# Patient Record
Sex: Male | Born: 1945 | Race: Black or African American | Hispanic: No | Marital: Married | Smoking: Former smoker
Health system: Southern US, Community
[De-identification: ages and names within clinical notes are randomized; demographics above are authoritative.]

## PROBLEM LIST (undated history)

## (undated) DIAGNOSIS — J449 Chronic obstructive pulmonary disease, unspecified: Secondary | ICD-10-CM

## (undated) DIAGNOSIS — E7800 Pure hypercholesterolemia, unspecified: Secondary | ICD-10-CM

## (undated) DIAGNOSIS — I10 Essential (primary) hypertension: Secondary | ICD-10-CM

## (undated) DIAGNOSIS — G609 Hereditary and idiopathic neuropathy, unspecified: Secondary | ICD-10-CM

## (undated) DIAGNOSIS — I2510 Atherosclerotic heart disease of native coronary artery without angina pectoris: Secondary | ICD-10-CM

## (undated) DIAGNOSIS — N189 Chronic kidney disease, unspecified: Secondary | ICD-10-CM

## (undated) DIAGNOSIS — E785 Hyperlipidemia, unspecified: Secondary | ICD-10-CM

## (undated) DIAGNOSIS — M87059 Idiopathic aseptic necrosis of unspecified femur: Secondary | ICD-10-CM

## (undated) DIAGNOSIS — M8700 Idiopathic aseptic necrosis of unspecified bone: Secondary | ICD-10-CM

## (undated) DIAGNOSIS — M1990 Unspecified osteoarthritis, unspecified site: Secondary | ICD-10-CM

## (undated) DIAGNOSIS — I2699 Other pulmonary embolism without acute cor pulmonale: Secondary | ICD-10-CM

## (undated) HISTORY — DX: Hyperlipidemia, unspecified: E78.5

## (undated) HISTORY — DX: Atherosclerotic heart disease of native coronary artery without angina pectoris: I25.10

## (undated) HISTORY — DX: Chronic obstructive pulmonary disease, unspecified: J44.9

## (undated) HISTORY — DX: Idiopathic aseptic necrosis of unspecified femur: M87.059

## (undated) HISTORY — DX: Hereditary and idiopathic neuropathy, unspecified: G60.9

## (undated) HISTORY — DX: Idiopathic aseptic necrosis of unspecified bone: M87.00

## (undated) HISTORY — PX: ABDOMINAL SURGERY: SHX537

## (undated) HISTORY — DX: Essential (primary) hypertension: I10

---

## 2001-05-07 ENCOUNTER — Ambulatory Visit (HOSPITAL_COMMUNITY): Admission: RE | Admit: 2001-05-07 | Discharge: 2001-05-07 | Payer: Self-pay | Admitting: Internal Medicine

## 2001-06-14 ENCOUNTER — Ambulatory Visit (HOSPITAL_COMMUNITY): Admission: RE | Admit: 2001-06-14 | Discharge: 2001-06-14 | Payer: Self-pay | Admitting: Internal Medicine

## 2002-03-01 ENCOUNTER — Ambulatory Visit (HOSPITAL_COMMUNITY): Admission: RE | Admit: 2002-03-01 | Discharge: 2002-03-01 | Payer: Self-pay | Admitting: Pulmonary Disease

## 2002-03-01 ENCOUNTER — Ambulatory Visit (HOSPITAL_COMMUNITY): Admission: RE | Admit: 2002-03-01 | Discharge: 2002-03-01 | Payer: Self-pay | Admitting: Cardiology

## 2002-03-02 ENCOUNTER — Ambulatory Visit (HOSPITAL_COMMUNITY): Admission: RE | Admit: 2002-03-02 | Discharge: 2002-03-02 | Payer: Self-pay | Admitting: Cardiology

## 2002-03-08 ENCOUNTER — Encounter (HOSPITAL_COMMUNITY): Admission: RE | Admit: 2002-03-08 | Discharge: 2002-04-07 | Payer: Self-pay | Admitting: Cardiology

## 2002-10-09 ENCOUNTER — Emergency Department (HOSPITAL_COMMUNITY): Admission: EM | Admit: 2002-10-09 | Discharge: 2002-10-09 | Payer: Self-pay | Admitting: Internal Medicine

## 2002-10-09 ENCOUNTER — Encounter: Payer: Self-pay | Admitting: Internal Medicine

## 2004-02-16 ENCOUNTER — Emergency Department (HOSPITAL_COMMUNITY): Admission: EM | Admit: 2004-02-16 | Discharge: 2004-02-16 | Payer: Self-pay | Admitting: Emergency Medicine

## 2004-04-17 ENCOUNTER — Ambulatory Visit: Payer: Self-pay | Admitting: Orthopedic Surgery

## 2005-03-17 ENCOUNTER — Encounter: Payer: Self-pay | Admitting: Internal Medicine

## 2005-03-17 ENCOUNTER — Ambulatory Visit (HOSPITAL_COMMUNITY): Admission: RE | Admit: 2005-03-17 | Discharge: 2005-03-17 | Payer: Self-pay | Admitting: Internal Medicine

## 2005-03-17 ENCOUNTER — Ambulatory Visit: Payer: Self-pay | Admitting: Internal Medicine

## 2005-05-13 ENCOUNTER — Ambulatory Visit (HOSPITAL_COMMUNITY): Admission: RE | Admit: 2005-05-13 | Discharge: 2005-05-13 | Payer: Self-pay | Admitting: Internal Medicine

## 2005-05-13 ENCOUNTER — Ambulatory Visit: Payer: Self-pay | Admitting: Internal Medicine

## 2005-07-04 ENCOUNTER — Ambulatory Visit: Payer: Self-pay | Admitting: *Deleted

## 2005-07-04 ENCOUNTER — Inpatient Hospital Stay (HOSPITAL_COMMUNITY): Admission: RE | Admit: 2005-07-04 | Discharge: 2005-07-29 | Payer: Self-pay | Admitting: General Surgery

## 2005-07-04 ENCOUNTER — Encounter (INDEPENDENT_AMBULATORY_CARE_PROVIDER_SITE_OTHER): Payer: Self-pay | Admitting: General Surgery

## 2005-07-11 ENCOUNTER — Ambulatory Visit: Payer: Self-pay | Admitting: Cardiology

## 2005-07-11 ENCOUNTER — Ambulatory Visit: Payer: Self-pay | Admitting: Internal Medicine

## 2005-07-16 ENCOUNTER — Ambulatory Visit: Payer: Self-pay | Admitting: Pulmonary Disease

## 2005-07-18 ENCOUNTER — Encounter: Payer: Self-pay | Admitting: Cardiology

## 2005-07-21 ENCOUNTER — Encounter: Payer: Self-pay | Admitting: Pulmonary Disease

## 2005-08-13 ENCOUNTER — Ambulatory Visit (HOSPITAL_COMMUNITY): Admission: RE | Admit: 2005-08-13 | Discharge: 2005-08-13 | Payer: Self-pay | Admitting: Internal Medicine

## 2005-12-12 ENCOUNTER — Encounter (HOSPITAL_COMMUNITY): Admission: RE | Admit: 2005-12-12 | Discharge: 2006-01-11 | Payer: Self-pay | Admitting: Pulmonary Disease

## 2006-01-12 ENCOUNTER — Encounter (HOSPITAL_COMMUNITY): Admission: RE | Admit: 2006-01-12 | Discharge: 2006-02-11 | Payer: Self-pay | Admitting: Pulmonary Disease

## 2006-02-13 ENCOUNTER — Encounter (HOSPITAL_COMMUNITY): Admission: RE | Admit: 2006-02-13 | Discharge: 2006-02-21 | Payer: Self-pay | Admitting: Pulmonary Disease

## 2006-12-04 ENCOUNTER — Ambulatory Visit: Payer: Self-pay | Admitting: Internal Medicine

## 2006-12-04 ENCOUNTER — Ambulatory Visit (HOSPITAL_COMMUNITY): Admission: RE | Admit: 2006-12-04 | Discharge: 2006-12-04 | Payer: Self-pay | Admitting: Internal Medicine

## 2007-02-22 ENCOUNTER — Encounter: Admission: RE | Admit: 2007-02-22 | Discharge: 2007-02-22 | Payer: Self-pay | Admitting: Neurology

## 2007-06-11 ENCOUNTER — Emergency Department (HOSPITAL_COMMUNITY): Admission: EM | Admit: 2007-06-11 | Discharge: 2007-06-11 | Payer: Self-pay | Admitting: Family Medicine

## 2007-07-03 ENCOUNTER — Emergency Department (HOSPITAL_COMMUNITY): Admission: EM | Admit: 2007-07-03 | Discharge: 2007-07-03 | Payer: Self-pay | Admitting: Emergency Medicine

## 2008-10-31 ENCOUNTER — Encounter: Admission: RE | Admit: 2008-10-31 | Discharge: 2008-10-31 | Payer: Self-pay | Admitting: Neurology

## 2009-01-06 ENCOUNTER — Emergency Department (HOSPITAL_COMMUNITY): Admission: EM | Admit: 2009-01-06 | Discharge: 2009-01-06 | Payer: Self-pay | Admitting: Emergency Medicine

## 2010-02-01 ENCOUNTER — Encounter (INDEPENDENT_AMBULATORY_CARE_PROVIDER_SITE_OTHER): Payer: Self-pay

## 2010-02-20 ENCOUNTER — Encounter: Payer: Self-pay | Admitting: Internal Medicine

## 2010-03-07 ENCOUNTER — Telehealth (INDEPENDENT_AMBULATORY_CARE_PROVIDER_SITE_OTHER): Payer: Self-pay

## 2010-03-08 ENCOUNTER — Encounter: Payer: Self-pay | Admitting: Internal Medicine

## 2010-03-22 ENCOUNTER — Ambulatory Visit: Payer: Self-pay | Admitting: Internal Medicine

## 2010-03-22 ENCOUNTER — Ambulatory Visit (HOSPITAL_COMMUNITY): Admission: RE | Admit: 2010-03-22 | Discharge: 2010-03-22 | Payer: Self-pay | Admitting: Internal Medicine

## 2010-03-22 HISTORY — PX: COLONOSCOPY: SHX174

## 2010-03-28 ENCOUNTER — Encounter: Payer: Self-pay | Admitting: Internal Medicine

## 2010-06-16 ENCOUNTER — Encounter: Payer: Self-pay | Admitting: Internal Medicine

## 2010-06-17 ENCOUNTER — Encounter: Payer: Self-pay | Admitting: Neurology

## 2010-06-27 NOTE — Progress Notes (Signed)
Summary: reviewed meds for tcs  Phone Note Call from Patient   Caller: Patient Summary of Call: Pt returned my call. I reviewed his meds, one change since triaged and that was Amitriptyline d/c'd. No new medical problems. Pt is scheduled for his colonoscopy on 03/22/2010. Initial call taken by: Cloria Spring LPN,  March 07, 2010 2:31 PM

## 2010-06-27 NOTE — Letter (Signed)
Summary: Recall, Screening Colonoscopy Only  [facility]  [address]   [place], [place] [address]   Phone: [phone]  Fax: [phone]    February 01, 2010  Kyle Patel [address] [place], [place]  [address] 1946/05/12   Dear [name],   Our records indicate it is time to schedule your colonoscopy.     Please call our office at [phone] and ask for the nurse.   Thank you,  Hendricks Limes, LPN Cloria Spring, LPN  [facility] Ph: [phone]   Fax: [phone]

## 2010-06-27 NOTE — Letter (Signed)
Summary: TRIAGE ORDER  TRIAGE ORDER   Imported By: Ave Filter 02/20/2010 10:00:24  _____________________________________________________________________  External Attachment:    Type:   Image     Comment:   External Document

## 2010-06-27 NOTE — Letter (Signed)
Summary: TRIAGE ORDER  TRIAGE ORDER   Imported By: Ave Filter 03/08/2010 10:54:34  _____________________________________________________________________  External Attachment:    Type:   Image     Comment:   External Document

## 2010-06-27 NOTE — Letter (Signed)
Summary: Patient Notice, Colon Biopsy Results  [facility]  [address]   [place], [place] [address]   Phone: [phone]  Fax: [phone]       March 28, 2010   [place] [address] [place], [place]  [address] [birth year]    Dear Kyle Patel,  I am pleased to inform you that the biopsies taken during your recent colonoscopy did not show any evidence of cancer upon pathologic examination.  Additional information/recommendations:  No further action is needed at this time.  Please follow-up with your primary care physician for your other healthcare needs.  You should have a repeat colonoscopy examination  in 5 years.  Please call us if you are having persistent problems or have questions about your condition that have not been fully answered at this time.  Sincerely,    R. Roetta Sessions MD, FACP [facility] Ph: [phone]    Fax: [phone]   Appended Document: Patient Notice, Colon Biopsy Results letter mailed to pt  Appended Document: Patient Notice, Colon Biopsy Results reminder in computer

## 2010-08-31 LAB — COMPREHENSIVE METABOLIC PANEL
ALT: 19 U/L (ref 0–53)
Albumin: 3.6 g/dL (ref 3.5–5.2)
Alkaline Phosphatase: 68 U/L (ref 39–117)
BUN: 20 mg/dL (ref 6–23)
Chloride: 109 mEq/L (ref 96–112)
GFR calc Af Amer: 58 mL/min — ABNORMAL LOW (ref >60)
Potassium: 3.9 mEq/L (ref 3.5–5.1)
Sodium: 140 mEq/L (ref 135–145)
Total Bilirubin: 0.4 mg/dL (ref 0.3–1.2)
Total Protein: 7 g/dL (ref 6.0–8.3)

## 2010-08-31 LAB — DIFFERENTIAL
Basophils Absolute: 0 10*3/uL (ref 0.0–0.1)
Basophils Relative: 0 % (ref 0–1)
Eosinophils Absolute: 0.2 10*3/uL (ref 0.0–0.7)
Eosinophils Relative: 2 % (ref 0–5)
Monocytes Absolute: 1 10*3/uL (ref 0.1–1.0)
Monocytes Relative: 12 % (ref 3–12)
Neutro Abs: 5.6 10*3/uL (ref 1.7–7.7)

## 2010-08-31 LAB — POCT CARDIAC MARKERS
CKMB, poc: 5.7 ng/mL (ref 1.0–8.0)
CKMB, poc: 5.8 ng/mL (ref 1.0–8.0)
Myoglobin, poc: >500 ng/mL (ref 12–200)
Troponin i, poc: <0.05 ng/mL (ref 0.00–0.09)

## 2010-08-31 LAB — CBC
MCHC: 34.7 g/dL (ref 30.0–36.0)
MCV: 91.2 fL (ref 78.0–100.0)
RBC: 4.13 MIL/uL — ABNORMAL LOW (ref 4.22–5.81)
RDW: 15.3 % (ref 11.5–15.5)

## 2010-10-08 NOTE — Op Note (Signed)
NAME:  Kyle Patel, Kyle Patel NO.:  [id number]   MEDICAL RECORD NO.:  [id number]          PATIENT TYPE:  AMB   LOCATION:  DAY                           FACILITY:  [facility]   PHYSICIAN:  R. Roetta Sessions, M.D. DATE OF BIRTH:  [birth year]   DATE OF PROCEDURE:  12/04/2006  DATE OF DISCHARGE:                                PROCEDURE NOTE   INDICATIONS FOR PROCEDURE:  The patient is a 65 year old gentleman who  was found to have a lesion at the splenic flexure in December 2006, on  colonoscopy.  Piecemeal removal revealed villous adenoma with carcinoma  in situ and it was felt this lesion could be removed completely  endoscopically.  Dr. Elpidio Anis performed a left hemicolectomy.  In  February 2007, path came back villous adenoma.  He has done well.  He  has not had any lower GI tract symptoms.  He is here for surveillance  examination.  This approach has been discussed with the patient at  length.  Potential risks, benefits and alternatives have been reviewed  and questions answered.  Please see documentation in medical record.   PROCEDURE NOTE:  O2 saturation, blood pressure, pulse and respirations  were monitored throughout the entire procedure.  Conscious sedation with  Versed 3 mg IV, Demerol 75 mg IV in divided doses.  Pentax video chip  system.   FINDINGS:  Digital exam revealed no abnormalities.  The prep was good.  Colonic mucosa was examined from the rectosigmoid junction to the  residual colonic mucosa all the way to the cecum.  Cecum, ileocecal  valve, appendiceal orifice well-seen.   Prior to its removal, the scope was slowly withdrawn where previous  mucosal surfaces were again seen.  The residual colonic mucosa appeared  normal.  Examination of the rectal mucosa with colorectal retroflexion  at the anal verge demonstrated no abnormalities.  The patient tolerated  the procedure well.   IMPRESSION:  Normal rectum, status post left hemicolectomy, otherwise  normal residual colonic mucosa.   RECOMMENDATIONS:  Repeat colonoscopy in 3 years.      [name], M.D.  Electronically Signed     RMR/MEDQ  D:  12/04/2006  T:  12/04/2006  Job:  [id number]   cc:   Ramon Dredge L. Juanetta Gosling, M.D.  Fax: [phone]

## 2010-10-11 NOTE — Procedures (Signed)
NAME:  Kyle Patel, Kyle Patel NO.:  [id number]   MEDICAL RECORD NO.:  [id number]          PATIENT TYPE:  INP   LOCATION:  IC10                          FACILITY:  [facility]   PHYSICIAN:  Vida Roller, M.D.   DATE OF BIRTH:  November 17, 1945   DATE OF PROCEDURE:  07/08/2005  DATE OF DISCHARGE:                                  ECHOCARDIOGRAM   PRIMARY:  Dr. Juanetta Gosling.   TAPE NUMBER:  [id number].  TAPE COUNT:  0 through 415.   This is a man with respiratory failure for LV systolic function. Technical  quality of the study is difficulty.   M-MODE TRACING:  The aorta is 31 mm.   The left atrium is 36 mm.   The septum is 14 mm.   Posterior wall is 12 mm.   Left ventricular diastolic dimension is 35 mm.   Left ventricular systolic dimension is 21 mm.   TWO-DIMENSIONAL AND DOPPLER IMAGING:  The left ventricle is small. There is  vigorous LV systolic function estimated at greater than 75%. There are no  obvious wall motion abnormalities. The septum is slightly flat, and there is  mild concentric left ventricular hypertrophy.   Right ventricle looks dilated the free wall is mildly hypokinetic. The  septum definitely is flattened. There is depressed RV systolic function.   The left atrium appears normal size. The right atrium is dilated.   The aortic valve is not well seen but did not see any significant valvular  heart disease.   The mitral valve also not well seen but appears to not have any significant  valvular heart disease either.   The tricuspid valve has some degree of regurgitation which is not well seen.   There is no pericardial effusion.      Vida Roller, M.D.  Electronically Signed     JH/MEDQ  D:  07/08/2005  T:  07/09/2005  Job:  [id number]

## 2010-10-11 NOTE — Discharge Summary (Signed)
NAME:  Kyle Patel, Kyle Patel NO.:  [id number]   MEDICAL RECORD NO.:  [id number]          PATIENT TYPE:  INP   LOCATION:  2313                         FACILITY:  [facility]   PHYSICIAN:  Dirk Dress. Katrinka Blazing, M.D.   DATE OF BIRTH:  06/23/1945   DATE OF ADMISSION:  07/04/2005  DATE OF DISCHARGE:  03/06/2007LH                                 DISCHARGE SUMMARY   DISCHARGE DIAGNOSIS:  1.  Recurrent villous tumor left colon.  2.  Hypotension.  3.  Osteoarthritis.  4.  Postoperative respiratory failure, ventilator dependent due to adult      respiratory distress syndrome.  5.  Hypokalemia.  6.  Renal failure.   DISPOSITION:  The patient was transferred to the critical care unit at [facility] for treatment of his acute respiratory failure.   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  He had initial resection  endoscopically in December 2002 and follow-up resection in January 2003,  repeat resection in October 2003.  He had a repeat evaluation in October  2006 and was found to have a large sessile polyp of the splenic flexure that  was partially removed with snare polypectomy.  He was referred for  Villotubular adenoma.  He was seen in December 2006.  He was advised to have  segmental colectomy but he wished to wait until February.  He states that he  had not had any problems in the interim.  The patient was scheduled for  repeat colonoscopy with confirmation of the site of the tumor with follow-up  segmental colectomy.  The patient underwent a bowel prep on February 9 is  admitted through day surgery.  He had intraoperative colonoscopy with  isolation of the tumor followed by a segmental colectomy.  This surgery went  uneventfully.  He had a primary stapled anastomosis without difficulty.  First postoperative day was unremarkable except that the patient had  leukocytosis with white count  15,000.  On the second postoperative day he  appeared to have some respiratory distress at rest. He denies shortness of  breath. He had O2 saturation of 99% on nonrebreathing mask, pO2 was 131, pH  7.32. Lungs revealed diffuse rales and rhonchi at both lung fields and was  mildly tachycardiac.  Chest x-ray showed infiltrates of the lungs  bilaterally and worse on the right than on the left. It was felt that he had  some volume overload.  He was given IV Lasix, IV fluids were decreased to 50  mL per hour.  He had continued respiratory difficulty with increasing  tachypnea. He had no evidence of peripheral edema and on the 12th, his O2  sat was 95% on 100% oxygen by Veni mask.  The exam of his chest showed  diffuse rales in both lung fields, worse on the right.  There was no JVD of  his neck.  By the 13th, the patient had worsening symptoms.  He developed  acute respiratory failure.  He was found by the nurses nonverbal  with  grayish discoloration and diaphoretic with poor respiratory effort.  O2 sat  was in the 60s on 100% nonrebreathing. Blood gases showed a pO2 of 19. This  was done on room air.  Reason for doing blood gases on room air is unknown.  He was seen initially by Annia Friendly. Loleta Chance, M.D. who managed his care. He was  intubated, transferred to the ICU with onset of ventilatory management.  He  was started on day of nebulizer treatments.  He was seen in consultation by  Ramon Dredge L. Juanetta Gosling, M.D. 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  He had some improvement  after intubation. His O2 sat was 100% on 100%, but his pO2 was only 68.  White count did not increase, hemoglobin remained stable. Lungs revealed  diffuse rales bilaterally.  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  He was treated and was seen in consultation by the pulmonary service at  [facility] via [facility].  Their recommendations were followed.  By  the 15th he was stable except for his respiratory status. His lungs  showed  diffuse rhonchi bilaterally, white count was 11,000, hemoglobin was stable.  BUN was rising from diuretic therapy and restriction of fluids.  Abdominal  films showed minimal small bowel gas with gas extending into the left colon.  He was discussed for transfer by the pulmonary service at [facility] and they agreed to accept him.  He was transferred by [facility] to  critical care unit at [facility] 11:40. His status was stable but  his condition was guarded at the time of transfer.      Dirk Dress. Katrinka Blazing, M.D.  Electronically Signed     LCS/MEDQ  D:  08/24/2005  T:  08/25/2005  Job:  [id number]

## 2010-10-11 NOTE — H&P (Signed)
NAME:  Kyle Patel, Kyle Patel NO.:  [id number]   MEDICAL RECORD NO.:  [id number]          PATIENT TYPE:  AMB   LOCATION:  DAY                           FACILITY:  [facility]   PHYSICIAN:  Jerolyn Shin C. Katrinka Blazing, M.D.   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  He had initial  resection in December 2002 with followup resection in January 2003.  He had  repeat resection again in October 2003 and that showed a 4 x 5 cm mass with  villous tumor.  He had repeat evaluation in October 2006 and was found to  have a large sessile polyp at the splenic flexure which was partially  removed with snare polypectomy.  Pathology showed villotubular adenoma.  He  was finally referred and was seen in December 2006.  He was advised to have  segmental colectomy but he wished to wait until February.  He has not had  any problems in the interim.  He is now scheduled for repeat colonoscopy  with confirmation of the site of the tumor with followup segmental  colectomy.   PAST MEDICAL HISTORY:  1.  Hypertension.  2.  Osteoarthritis.   MEDICATIONS:  1.  Lodine two tablets twice a day.  2.  Lipitor 40 mg daily.  3.  Lotrel 5/10 daily.  4.  Tramadol 50 mg every six hours.  5.  Prednisone 10 mg two twice a daily.  6.  Aspirin 81 mg daily.   PHYSICAL EXAMINATION:  VITAL SIGNS:  Blood pressure 148/86, pulse 104,  respirations 20, weight 186 pounds.  HEENT:  Unremarkable.  NECK:  Supple.  No JVD, bruits, adenopathy, or thyromegaly.  CHEST:  Reveals a few rhonchi with scattered fine wheezes.  HEART:  Regular rate and rhythm without murmur, gallop or rub.  ABDOMEN:  Soft, nontender.  No masses.  EXTREMITIES:  Mild tenderness of the metacarpals of the right hand.  No  swelling noted.  Good  mobility of the fingers of the right hand.  NEUROLOGIC:  No focal motor, sensory or cerebellar deficits.   IMPRESSION:  1.  Tubulovillous tumor, left colon.  2.  Hypertension.  3.  Osteoarthritis.   PLAN:  Preoperative colonoscopy with localization of tumor followed by  segmental colectomy.      Dirk Dress. Katrinka Blazing, M.D.  Electronically Signed     LCS/MEDQ  D:  07/03/2005  T:  07/04/2005  Job:  [id number]

## 2010-10-11 NOTE — Consult Note (Signed)
NAME:  Kyle Patel, Kyle Patel NO.:  [id number]   MEDICAL RECORD NO.:  [id number]          PATIENT TYPE:  INP   LOCATION:  IC10                          FACILITY:  [facility]   PHYSICIAN:  Edward L. Juanetta Gosling, M.D.DATE OF BIRTH:  1945-09-16   DATE OF CONSULTATION:  07/08/2005  DATE OF DISCHARGE:                                   CONSULTATION   MEDICAL CONSULTATION:  Patient of Kyle Patel.   REASON FOR CONSULTATION:  Respiratory failure.   HISTORY:  Kyle Patel is a 65 year old who had surgery done on the 9th which  was for a recurrent tubulovillous adenoma of the left colon; he had a  segmental left colectomy. Since that time he has had what looks like perhaps  some congestive heart failure on chest x-ray and then today he became  increasingly hypoxic to the point that he required intubation and mechanical  ventilation. He had an initial resection December 2002, followup January  2003, October 2003 and then in 2006 he was found to have a large sessile  polyp at the splenic flexure which showed villotubular adenoma as well. He  had been seen in December by Kyle Patel, was advised to have segmental  colectomy, wanted to wait until February.   PAST MEDICAL HISTORY:  Positive for hypertension, osteoarthritis,  hyperlipidemia.   SOCIAL HISTORY:  He smokes about a pack of cigarettes a day. He does use  alcohol on a daily basis.   FAMILY HISTORY:  His family history is positive for heart disease but it is  not really quite clear exactly the degree of that.   REVIEW OF SYSTEMS:  His review of systems otherwise pretty negative.   PHYSICAL EXAMINATION:  GENERAL:  Physical examination now shows that he is  intubated on a ventilator. He is sedated.  VITAL SIGNS:  The heart rate is 137, blood pressure 126/64, O2 saturation is  100%, respirations 14.  HEAD AND NECK:  He does not have any definite jugular venous distension. His  mucous membranes are moist. His pupils do react.  CHEST:  His  chest shows wheezes and rales bilaterally. His heart is regular  without gallop but with a tachycardia.  ABDOMEN:  Abdomen soft, no masses postoperative.  EXTREMITIES:  Extremities showed no edema.  CNS:  He is sedated.   LABORATORY WORK:  Cardiac panel done yesterday showed a troponin of 0.08, CK  440, MB of 4.6 without definite evidence of myocardial infarction. Blood gas  at 4 o'clock on room air pH 7.44, pCO2 of 41, pO2 listed at 19 (I suspect  this was a venous gas). Cardiac panel from 550 CK 327, MB 4.3, troponin  0.07. CBC: White count 11,300, hemoglobin 11.1, platelets 212. BMET: BUN is  19, creatinine 1.3, glucose 130, sodium 147. Chest x-ray shows marked  bilateral infiltration.   ASSESSMENT AND PLAN:  I think clearly he has got respiratory failure  probably on the basis of congestive heart failure. He is set for  echocardiogram. He is having serial cardiac enzymes, would go ahead and  check an EKG, check BNP to  see what that shows, continue to follow his blood  gas.      Edward L. Juanetta Gosling, M.D.  Electronically Signed     ELH/MEDQ  D:  07/08/2005  T:  07/08/2005  Job:  [id number]

## 2010-10-11 NOTE — Procedures (Signed)
   NAME:  Kyle Patel, Kyle Patel                           ACCOUNT NO.:  [id number]   MEDICAL RECORD NO.:  [id number]                   PATIENT TYPE:  OUT   LOCATION:  DFTL                                 FACILITY:  [facility]   PHYSICIAN:  Edward L. Juanetta Gosling, M.D.             DATE OF BIRTH:  1945-10-13   DATE OF PROCEDURE:  DATE OF DISCHARGE:                                    STRESS TEST   INDICATIONS FOR PROCEDURE:  The patient is undergoing graded exercise  testing basically as part of a physical examination.  He has no cardiac  symptoms.  There were no contraindications to graded exercise testing.   The patient exercised for 5 minutes and 10 seconds on the Bruce protocol  reaching and sustaining 7.0 METS.  His maximum recorded heart rate is 165  which is 99% of his age predicted maximal heart rate.  His blood pressure  response to exercise was exaggerated with peak systolic blood pressure being  240, peak diastolic being 98.  He developed ST-T wave changes inferiorly and  laterally that were as much as 2 mm of flatline ST depression, but without  any associated symptoms.  His resting electrocardiogram showed nonspecific  ST-T wave changes.  Exercise was discontinued and his EKG returned toward  normal.  He had no symptoms during exercise except a sensation that his  heart was beating fast.   IMPRESSION:  1. Fair exercise tolerance.  2. Hypertensive response to exercise.  3. Marked ST-T wave changes suggestive of inducible ischemia with exercise.                                               Edward L. Juanetta Gosling, M.D.    ELH/MEDQ  D:  03/01/2002  T:  03/02/2002  Job:  [id number]

## 2010-10-15 NOTE — Op Note (Signed)
NAME:  Kyle Patel, Kyle Patel NO.:  [id number]   MEDICAL RECORD NO.:  [id number]          PATIENT TYPE:  AMB   LOCATION:  DAY                           FACILITY:  [facility]   PHYSICIAN:  R. Roetta Sessions, M.D. DATE OF BIRTH:  07-16-1945   DATE OF PROCEDURE:  05/13/2005  DATE OF DISCHARGE:                                 OPERATIVE REPORT   PROCEDURE:  Colonoscopy with biopsy.   INDICATIONS FOR PROCEDURE:  The patient is a 65 year old gentleman with a  history of colonic polyps who came back for surveillance colonoscopy in  October this year (he was overdue). He was found to have a large sessile  polyp at the splenic flexure which was partially piecemeal, removed with  saline cystic duct polypectomy technique. There was a polyp at ascending  colon which was cold biopsied/removed.   The tissue from the splenic flexure lesion came back tubulovillous adenoma.  No cancer (this was erroneously labeled as hepatic flexure by pathologist  previously). Because of the complex nature of this polyp, he was brought  back early to reassess this location as there was likely residual polyp  present. Kyle Patel has not had any GI symptoms since the last colonoscopy.  Colonoscopy is now being done for the reasons outlined above. Potential  risks, benefits, and alternatives have been reviewed and questions answered.  He is agreeable. Please see documentation in the medical record.   PROCEDURE NOTE:  O2 saturation, blood pressure, pulse, and respirations were  monitored throughout the entire procedure. Conscious sedation with Versed 3  mg IV and Demerol 75 mg IV in divided doses.   INSTRUMENT:  Olympus video chip system.   FINDINGS:  Digital rectal exam revealed no abnormalities.   ENDOSCOPIC FINDINGS:  Prep was good.   Rectum:  Examination of the rectal mucosa including retroflexed view of the  anal verge revealed no abnormalities.   Colon:  Colonic mucosa was surveyed from the  rectosigmoid junction through  the left, transverse, and right colon to the area of the appendiceal  orifice, ileocecal valve, and cecum. These structures were well seen and  photographed for the record. From this level, the scope was slowly  withdrawn, and all previously mentioned mucosal surfaces were again seen.  Again, at the area of the splenic flexure, there was a sprawling sessile  appearing polypoid lesion with two areas of central depression straddling a  fold. Also, the periphery of the lesion distally appeared to be associated  with retracted colonic mucosa into the lesion.   This had an infiltrating appearance. Utilizing the sclerotherapy needle, I  injected 3 cc of sterile normal saline at the periphery of the lesion. This  lesion appeared to be fixed and did not lift away from the colonic wall.  Consequently, I took the jumbo biopsy forceps and took several biopsies for  histology. No attempts at further resection were made. The remainder of the  colonic mucosa appeared normal. The patient tolerated the procedure well and  was reactive to endoscopy.   IMPRESSION:  1.  Normal rectum.  2.  A  3 x 4 cm sessile lesion with central depression and with associated      mucosal retraction at the splenic flexure representing site of prior      polypectomy. This lesion was biopsied only. It would not lift up with      sterile saline injection. The remainder of the colonic mucosa appeared      normal.   RECOMMENDATIONS:  1.  The patient needs to have the lesion at the splenic flexure removed      surgically as it is not amendable to complete endoscopic resection. Will      discuss further with patient and family. He should have his splenic      flexure removed in the near future. Will follow up on pathology.  2.  Further recommendations to follow.      Kyle Patel, M.D.  Electronically Signed     RMR/MEDQ  D:  05/13/2005  T:  05/14/2005  Job:  [id number]

## 2010-10-15 NOTE — Op Note (Signed)
NAME:  Kyle Patel, Kyle Patel NO.:  [id number]   MEDICAL RECORD NO.:  [id number]          PATIENT TYPE:  INP   LOCATION:  2313                         FACILITY:  [facility]   PHYSICIAN:  Adolph Pollack, M.D.DATE OF BIRTH:  [birth year]   DATE OF PROCEDURE:  07/28/2005  DATE OF DISCHARGE:                                 OPERATIVE REPORT   PREOP DIAGNOSIS:  Ventilatory dependent respiratory failure.   POSTOPERATIVE DIAGNOSIS:  Ventilatory dependent respiratory failure.   PROCEDURE:  Tracheostomy (#8 cuffed Shiley).   SURGEON:  Rosenbower.   ASSISTANT:  Revonda Standard L. Rennis Harding, N.P.   ANESTHESIA:  General.   INDICATIONS:  This is a 65 year old male who has got ventilatory dependent  respiratory failure unable to be weaned. It is felt that tracheostomy will  help with his wean and long-term placement. He is now brought to the  operating room for that.   He is brought from the ICU to the operating room and placed on the operating  room table and given some general anesthesia.  The neck was slightly  extended and the neck was sterilely prepped and draped. Local anesthetic was  infiltrated the subcutaneous tissue. A transverse incision was made one  fingerbreadth above the clavicles in the midline through skin, subcutaneous  tissue. Anterior jugular vein was divided. The pretracheal fascia was  incised and the trachea was identified. Two stay sutures of 3-0 silk were  placed on the right and left side of the trachea at the level of the first  and second tracheal ring. Then made an incision between the first and the  second tracheal ring in a cruciate fashion. The endotracheal tube was pulled  back and the tracheostomy tube placed into the trachea. The balloon was  inflated. The tracheostomy tube was hooked up to the ventilator with good  CO2 return and good oxygenation.   Tracheostomy tube was then anchored to the skin with 3-0 nylon suture.  Tracheostomy tape was also tied to  further anchor the tracheostomy. A  sterile dressing was applied.   He tolerated procedure without apparent complications, was taken back to the  ICU in critical condition. A portal chest x-ray is pending.      Adolph Pollack, M.D.  Electronically Signed    TJR/MEDQ  D:  07/28/2005  T:  07/29/2005  Job:  [id number]

## 2010-10-15 NOTE — Op Note (Signed)
NAME:  [name], [name] NO.:  [id number]   MEDICAL RECORD NO.:  [id number]          PATIENT TYPE:  AMB   LOCATION:  DAY                           FACILITY:  [facility]   PHYSICIAN:  R. Roetta Sessions, M.D. DATE OF BIRTH:  February 14, 1946   DATE OF PROCEDURE:  03/17/2005  DATE OF DISCHARGE:                                 OPERATIVE REPORT   PROCEDURE:  Colonoscopy with saline assisted snare polypectomy and cold  biopsy.   ENDOSCOPIST:  Gerrit Friends. Rourk, M.D.   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  He underwent snare polypectomy.  He underwent followup exam with  snare polypectomy in the same area in 2003.  He is overdue for early  surveillance followup as previously recommended.  He is having no lower GI  tract symptoms.  Colonoscopy is now being done.  This approach has been  discussed with the patient at length.  The potential risks, benefits, and  alternatives have been reviewed; questions answered.  He is agreeable.  Please see documentation in the medical record.   PROCEDURE NOTE:  O2 saturation, blood pressure, pulse and respirations were  monitored throughout the entire procedure.   CONSCIOUS SEDATION:  Versed 4 mg IV, Demerol 100 mg IV in divided doses.   INSTRUMENT:  Olympus video chip system.   FINDINGS:  Digital rectal exam revealed no abnormalities.   ENDOSCOPIC FINDINGS:  The prep was good.   RECTUM:  Examination of the rectal mucosa including the retroflex view of  the anal verge revealed no abnormalities.   COLON:  The colonic mucosa was surveyed from the rectosigmoid junction  through the left transverse and right colon to the area of the appendiceal  orifice, ileocecal valve, and cecum.  These structures were well seen and  photographed for the record.   From this level the scope was slowly withdrawn.  All previously mentioned  mucosal surfaces were again seen.  The patient had a sprawling, 4 x 5 cm  polyp spanning the fold at the splenic flexure; and a 3 mm diminutive polyp  in the mid ascending colon.  The mid ascending polyp was cold  biopsied/removed.  The polyp at the splenic flexure was lifted away from the  colonic wall with 4 mL of sterile normal saline injected submucosally.  A  subsequent piecemeal polypectomy was performed.  Multiple fragments were  submitted to the pathologist.  We left a small amount of residual polypoid  tissue behind a fold, but the majority of this lesion appeared to have been  removed without apparent complication.  The remainder of the colonic mucosa  appeared normal.  The patient tolerated the procedure very well; and was  reacted in endoscopy.   IMPRESSION:  1.  Normal rectum.  2.  Large sessile polyp at the splenic flexure, at least partially removed      with piecemeal snare polypectomy, saline assisted.  3.  The remaining polyp in the ascending colon cold biopsied/removed.  4.  The remainder of the colonic mucosa appeared normal.   RECOMMENDATIONS:  1.  No aspirin or arthritis medications for 10 days.  2.  Followup on path.  3.  If the lesion contains carcinoma he will need a left hemicolectomy; if      not, he will need a VERY EARLY surveillance exam, i.e. in 6-8 weeks.      This algorithm has been gone over in detail with Kyle Patel.      Kyle Patel, M.D.  Electronically Signed     RMR/MEDQ  D:  03/17/2005  T:  03/17/2005  Job:  [id number]   cc:   Kyle Patel, M.D.  Fax: [phone]

## 2010-10-15 NOTE — Discharge Summary (Signed)
NAME:  Kyle Patel, Kyle Patel NO.:  [id number]   MEDICAL RECORD NO.:  [id number]          PATIENT TYPE:  INP   LOCATION:  2313                         FACILITY:  [facility]   PHYSICIAN:  Shan Levans, M.D. LHCDATE OF BIRTH:  04-Apr-1946   DATE OF ADMISSION:  07/11/2005  DATE OF DISCHARGE:                                 DISCHARGE SUMMARY   DISCHARGE DIAGNOSES:  1.  Prolonged vent-dependent respiratory failure with acute respiratory      distress syndrome.  2.  Ileus.  3.  Hypertension.  4.  Anemia.  5.  Hypocalcemia.  6.  Resolved hyponatremia.  7.  Leukocytosis.  8.  Hyperglycemia.   HISTORY OF PRESENT ILLNESS:  Kyle Patel is a 65 year old African-American male  one pack per day smoker lifelong who was diagnosed with a tubulovillous  adenoma and underwent segmental left colectomy on July 04, 2005 by Dr.  Elpidio Anis.  Postoperatively on July 08, 2005 he was found to be  nonverbal with respiratory distress.  This was noted by a nurse on the floor  at [facility]  He required urgent orotracheal intubation and  mechanical ventilatory support and chest x-ray and CT were indicative of  acute lung injury.  He was transferred to [facility] on July 11, 2005 under the care of the pulmonary critical care group for ICU  management.   In discussion with his family he has had hypertension for greater than two  years, noncompliant with his medications.  He was required to restart  antihypertensives prior to surgery for a blood pressure reportedly being  200/100.  Family reports he was having nightly vomiting episodes.  He is not  normally limited by shortness of breath with activities of daily living  prior to surgery.   PAST MEDICAL HISTORY:  1.  Hypertension with noncompliance.  He had been on Avapro and Lotrel prior      to surgery.  2.  Hyperlipidemia with Lipitor treatment.  3.  Chronic obstructive pulmonary disease which is followed by Shaune Pollack,      M.D.   He had no known drug allergies.  MORPHINE SULFATE did create itching.   SOCIAL HISTORY:  Remarkable for one pack a day lifelong tobacco.  He worked  for the tobacco company.  He is married with children.   FAMILY HISTORY:  Reviewed and noncontributory.   REVIEW OF SYSTEMS:  Not intubated because he arrived on mechanical  ventilatory support with a #8 endotracheal tube in place.   LABORATORY DATA:  pH 7.43, pCO2 38, pO2 60.  Sodium 151, potassium 3.1,  chloride 119, CO2 27, glucose 98, BUN 45, creatinine 1, calcium 8.1,  magnesium 2.3, phosphorous 4.5.  WBC 23.2, hemoglobin 9.1, hematocrit 27.2,  platelets 277.  ESR 91.  Chest x-ray shows no interval change.  Note, Swan-  Ganz catheter had been removed.  He has a left arm percutaneous indwelling  Seldinger  catheter that has been placed.  12-lead EKG shows normal sinus  rhythm, ventricular rate of 83 beats per minute.  Transesophageal  echocardiogram performed  on February 23 demonstrates left ventricular  ejection fraction estimated to be 75%.  Study was inadequate for evaluation  of left ventricular regional wall motion.  Patient is tachycardic at time of  study.   HOSPITAL COURSE:  #1 - VENT-DEPENDENT RESPIRATORY FAILURE FOLLOWING  LAPAROTOMY FOR A COLECTOMY PERFORMED ON July 04, 2005 BY DR. Katrinka Blazing IN  [place]:  He remains on mechanical ventilatory support.  He has been indicative of acute respiratory distress syndrome.  His current  mechanical ventilatory settings are at AC/VC plus with a rate of 35 with a  tidal volume of 420 mL which equals 6 mL/kg for low stretch ventilation.  His FiO2 of 0.4 and he is on 5 of PEEP.  This generates a peak inspiratory  pressure of 30 with a plateau pressure of 20.  His last blood gases on these  settings were pH 7.39, pCO2 41, pO2 of 43 with a bicarbonate of 25 and then  base excess of 0 with saturations of 94%.  He has required prolonged  mechanical  ventilatory support.  He will undergo tracheostomy by Dr. Avel Peace today in preparation for transfer to a long-term ventilatory care  facility.   #2 - ILEUS:  He is currently noted to have an ileus.  He is on TNA.  His TNA  is going at 45 mL an hour.  He does have a Panda tube in place which is  currently being flushed and has minimal drainage at this time.  He does have  a Flexoseal in place with very little drainage.   #3 - HYPERTENSION:  Hypertension is moderately well controlled on current  medications.  He is noted to be tachycardic at times.   #4 - ANEMIA:  His hemoglobin is increased from 10.2 to 9.1.  He remains on  EPO protocol.   #5 - HYPOKALEMIA:  Mild.   #6 - HYPONATREMIA:  Being addressed with TNA.   #7 - LEUKOCYTOSIS:  He is currently off all antibiotics.   #8 - HYPERGLYCEMIA:  He remains on a D5W for increased free water.  This  will be discontinued and will give him free O2 via his feeding tube.  His IV  has been changed to quarter normal saline at 40 mL an hour.   PROCEDURE:  He had endotracheal tube placed on February 13 which was removed  on February 20.  He required urgent re-intubation on February 20.  It  remains in place.  He had a PA catheter and sleeve placed on February 22  which was removed on February 28.  He had a left subclavian central venous  catheter placed on February 16, removed on February 23.  He had a left PICC  placed on February 23, removed on March 4.  He had a right PICC placed on  March 4  which currently remains in place.  Note that he was treated with  steroids on February 23.  He has GI prophylaxis with proton-pump inhibitor  and DVT prophylaxis with PAS hose.  He has been on ARDS meds since February  16.  He has had a sedation protocol since February 16.  He has been on EPO  protocol since July 14, 2005.   MEDICATIONS:  1.  He is on ICU hyperglycemic protocol.  2.  Atrovent 0.5 mg via nebulizer q.6h. 3.  Chlorhexidine  oral hygiene.  4.  Aricept 100 mcg every Monday at 6 p.m.  5.  Thiamine 100 mg IV daily.  6.  Xopenex 0.63 q.6h. by nebulizer.  7.  Zantac 150 mg q.12h.  8.  Catapres 0.2 mg via tube q.6h.  9.  Lantus via subcutaneous injection 25 units q.12h.  10. Sliding scale insulin per the standard ICU hyperglycemic protocol.  11. Klonopin 4 mg q.6h.  12. Duragesic patch 100 mcg changed q.72h.  13. Solu-Cortef 100 mg IV q.8h.  14. Seroquel 200 mg q.12h.  15. Lovenox which has been placed on hold for impending trach.  16. IV as dictated earlier.  17. Senokot p.r.n. one tablet q.h.s.  18. Ativan 1 mg IV q.3h. p.r.n.  19. Haldol 1-2 mg every 30 minutes as needed.  20. Nitroglycerin drip 50 mg p.r.n.  21. Versed p.r.n. 2-5 mg per hour.  22. Ativan 1-4 mg per hour, both of these through the sedation protocol.  23. Fentanyl 25-100 IV per hour via the sedation protocol.   DIET:  TNA.   DISPOSITION/CONDITION ON DISCHARGE:  He has remained in vent-dependent  respiratory failure since admission.  His peak inspiratory pressures have  improved.  His FiO2 demands have increased.  He is status post colectomy and  this has been evaluated by the [facility] after his transfer  down here from Dr. Eppie Gibson service in [place], [place].  He  needs long-term vent management.  The plan is to transfer him to [facility]  facility in the near future.      Brett Canales Minor, A.C.N.P. [facility]      Shan Levans, M.D. [facility]  Electronically Signed    SM/MEDQ  D:  07/28/2005  T:  07/28/2005  Job:  [id number]   cc:   Adolph Pollack, M.D.  [address]  [place]  [place] [address]   Oneal Deputy. Juanetta Gosling, M.D.  Fax: [phone]   Dirk Dress Katrinka Blazing, M.D.  Fax: [phone]

## 2010-10-15 NOTE — Op Note (Signed)
[facility]  Patient:    Kyle Patel, Kyle Patel Visit Number: [id number] MRN: [id number]          Service Type: END Location: DAY Attending Physician:  Kyle Patel Dictated by:   Kyle Patel, M.D. Proc. Date: 05/07/01 Admit Date:  05/07/2001   CC:         Kyle Patel, M.D.   Operative Report  PROCEDURE:  Colonoscopy with snare polypectomy.  INDICATIONS:  Mr. Kyle Patel is a pleasant 65 year old African-American male referred through the courtesy of Dr. Juanetta Patel for high-risk screening colonoscopy.  He has a 23 year old sister who recently had resection of colorectal carcinoma.  Mr. Kyle Patel is devoid of any GI symptoms and has never passed any blood.  No abdominal pain, no change in bowel habits.  He has never had his lower GI tract imaged.  Colonoscopy is now being done as a high-risk screen maneuver.  This approach has been discussed with Mr. Kyle Patel at length. Potential risks, benefits, and alternatives have been reviewed and questions answered.  Please see my handwritten H&P for more information.  GASTROENTEROLOGIST:  Kyle Patel, M.D.  PROCEDURE NOTE:  O2 saturation, blood pressure, pulses of this patient were monitored throughout the entire procedure.  Conscious sedation Versed 5 mg IV in divided doses, Demerol 75 mg IV in divided doses.  INSTRUMENT: Olympus video chip colonoscope.  FINDINGS:  Digital rectal examination revealed no abnormalities.  ENDOSCOPIC FINDINGS:  Prep was good.  Rectum:  Examination of rectal mucosa including retroflexed view of the anal verge revealed no abnormalities.  COLON:  Colonic mucosa was surveyed from the rectosigmoid junction through the left transverse right colon to the appendiceal orifice and ileocecal valve and cecum.  These structures were well seen and photographed for the record.  At approximately the splenic flexure, there was a large complex polypoid neoplastic-appearing process coming out of  the mucosa.  A good bit of this was on the stalk.  Dimensions were approximately 3 x 6 cm.  Manipulated with the scleral needle, and this lesion did appear to be on a broad stalk.  No other abnormalities of colonic mucosa were seen.  Attention was turned to this lesion; 4 cc of sterile saline was used to inject the base of the polypoid lesion submucosally.  The lesion lifted nicely off the colonic wall when this was done.  I subsequently used snare cautery and removed a good portion of this lesion.  It was significantly debulked; however, a good bit of the base of this lesion was difficult to get at with the snare.  Multiple pieces were recovered with the basket for pathology.  The patient tolerated the procedure well and was reacted in endoscopy.  IMPRESSION: 1. Normal rectum. 2. Pedunculated complex polypoid mass at the splenic flexure status post    partial resection/debulking with snare cautery.  A significant portion    of this polyp remains behind. 3. The remainder of the colonic mucosa appeared normal.  RECOMMENDATIONS: 1. I suspect this lesion will contain frank carcinoma.  Even if the samples    submitted to pathology do not contain carcinoma, I do not feel this lesion    can be totally removed endoscopically.  Consequently, we have recommended    resection. 2. Mr. Kyle Patel will think about the surgery he wishes.  We will make a referral    and follow up on pathology. 3. No aspirin or ______  medications for the next 10 days. Dictated by:   Kyle Patel, M.D.  Attending Physician:  Kyle Patel DD:  05/07/01 TD:  05/07/01 Job: [id number]

## 2010-10-15 NOTE — Op Note (Signed)
NAME:  Kyle Patel, Kyle Patel NO.:  [id number]   MEDICAL RECORD NO.:  [id number]          PATIENT TYPE:  INP   LOCATION:  A314                          FACILITY:  [facility]   PHYSICIAN:  Dirk Dress. Katrinka Blazing, M.D.   DATE OF BIRTH:  07-14-1945   DATE OF PROCEDURE:  07/04/2005  DATE OF DISCHARGE:                                 OPERATIVE REPORT   PREOPERATIVE DIAGNOSIS:  Recurrent tubulovillous adenoma, left colon.   POSTOPERATIVE DIAGNOSIS:  Recurrent tubulovillous adenoma, left colon   PROCEDURE:  Intraoperative colonoscopy followed by segmental left colectomy.   SURGEON:  Dr. Katrinka Blazing.   DESCRIPTION:  Under general anesthesia, the patient was placed in a supine  position. Digital examination of the anus was carried out. Colonoscope was  introduced and maneuvered to the ascending colon. The prep was poor, so the  mass was not seen. The bowel was maximally insufflated, and copious washings  were carried out as the scope was slowly withdrawn. There was then area of  slight thickening and induration which was partially covered with stool.  This was distal to the splenic flexure. Copious irrigation was carried out  in this area, and the wide, flat-based sort of infiltrated lesion was then  noted. It was marked with 2 cc of intramural methylene blue. The scope was  slowly withdrawn, and no other lesion was noted. After this, the patient was  prepped and draped in a sterile field. Midline incision was made. The left  colon was mobilized up to the splenic flexure. The area of methylene blue  was isolated. It was apparent that we had to extend the dissection to the  distal transverse colon. The gastrocolic omentum was divided using harmonic  scalpel. The colon was then mobilized. The colon was divided midway between  the middle colic artery and the splenic flexure and about one third of the  distance of the descending colon. The mesentery was divided with the  harmonic scalpel, and major  vessels were doubly clamped, divided and tied  with two ligatures of silk. The mass was sent for both pathologic  evaluation. A side-to-side anastomosis was performed using GIA 60 and TA 55.  The anastomosis was widely patent and was not under any tension. Mesenteric  defect was closed with interrupted silk. Irrigation of the area was carried  out. Inspection of the spleen revealed no evidence of any bleeding in this  area. Further irrigation was carried out. The viscera was placed back in the  abdomen. Fascia was  closed with running #1 Prolene. Subcutaneous tissue was closed with running  2-0 Monocryl, and the skin was closed with staples. The patient tolerated  procedure well. He was awakened from anesthesia uneventfully, transferred to  a bed and taken to the postanesthetic care unit in satisfactory condition.      Dirk Dress. Katrinka Blazing, M.D.  Electronically Signed     LCS/MEDQ  D:  07/04/2005  T:  07/04/2005  Job:  [id number]   cc:   R. Roetta Sessions, M.D.  [address]  [place]  [place] [address]

## 2011-09-16 DIAGNOSIS — M179 Osteoarthritis of knee, unspecified: Secondary | ICD-10-CM | POA: Insufficient documentation

## 2011-10-25 DIAGNOSIS — I2699 Other pulmonary embolism without acute cor pulmonale: Secondary | ICD-10-CM

## 2011-10-25 HISTORY — DX: Other pulmonary embolism without acute cor pulmonale: I26.99

## 2011-11-10 ENCOUNTER — Inpatient Hospital Stay (HOSPITAL_COMMUNITY): Payer: 59

## 2011-11-10 ENCOUNTER — Inpatient Hospital Stay (HOSPITAL_COMMUNITY)
Admission: EM | Admit: 2011-11-10 | Discharge: 2011-11-19 | DRG: 176 | Disposition: A | Discharge status: Home / Self Care | Payer: 59 | Attending: Pulmonary Disease | Admitting: Pulmonary Disease

## 2011-11-10 ENCOUNTER — Ambulatory Visit (HOSPITAL_COMMUNITY)
Admit: 2011-11-10 | Discharge: 2011-11-10 | Disposition: A | Discharge status: Home / Self Care | Payer: 59 | Attending: Emergency Medicine | Admitting: Emergency Medicine

## 2011-11-10 ENCOUNTER — Encounter (HOSPITAL_COMMUNITY): Payer: Self-pay

## 2011-11-10 ENCOUNTER — Encounter (HOSPITAL_COMMUNITY): Payer: Self-pay | Admitting: *Deleted

## 2011-11-10 ENCOUNTER — Emergency Department (HOSPITAL_COMMUNITY): Payer: 59

## 2011-11-10 DIAGNOSIS — I824Z9 Acute embolism and thrombosis of unspecified deep veins of unspecified distal lower extremity: Secondary | ICD-10-CM | POA: Diagnosis present

## 2011-11-10 DIAGNOSIS — T368X5A Adverse effect of other systemic antibiotics, initial encounter: Secondary | ICD-10-CM | POA: Diagnosis not present

## 2011-11-10 DIAGNOSIS — N183 Chronic kidney disease, stage 3 unspecified: Secondary | ICD-10-CM | POA: Diagnosis present

## 2011-11-10 DIAGNOSIS — Z87891 Personal history of nicotine dependence: Secondary | ICD-10-CM

## 2011-11-10 DIAGNOSIS — R0902 Hypoxemia: Secondary | ICD-10-CM | POA: Diagnosis present

## 2011-11-10 DIAGNOSIS — I10 Essential (primary) hypertension: Secondary | ICD-10-CM

## 2011-11-10 DIAGNOSIS — N189 Chronic kidney disease, unspecified: Secondary | ICD-10-CM | POA: Diagnosis present

## 2011-11-10 DIAGNOSIS — I824Y9 Acute embolism and thrombosis of unspecified deep veins of unspecified proximal lower extremity: Secondary | ICD-10-CM | POA: Diagnosis present

## 2011-11-10 DIAGNOSIS — L299 Pruritus, unspecified: Secondary | ICD-10-CM | POA: Diagnosis not present

## 2011-11-10 DIAGNOSIS — E7800 Pure hypercholesterolemia, unspecified: Secondary | ICD-10-CM | POA: Diagnosis present

## 2011-11-10 DIAGNOSIS — R079 Chest pain, unspecified: Secondary | ICD-10-CM

## 2011-11-10 DIAGNOSIS — I129 Hypertensive chronic kidney disease with stage 1 through stage 4 chronic kidney disease, or unspecified chronic kidney disease: Secondary | ICD-10-CM | POA: Diagnosis present

## 2011-11-10 DIAGNOSIS — I2699 Other pulmonary embolism without acute cor pulmonale: Principal | ICD-10-CM | POA: Diagnosis present

## 2011-11-10 DIAGNOSIS — Z7982 Long term (current) use of aspirin: Secondary | ICD-10-CM

## 2011-11-10 HISTORY — DX: Essential (primary) hypertension: I10

## 2011-11-10 HISTORY — DX: Pure hypercholesterolemia, unspecified: E78.00

## 2011-11-10 LAB — COMPREHENSIVE METABOLIC PANEL WITH GFR
ALT: 18 U/L (ref 0–53)
AST: 19 U/L (ref 0–37)
Albumin: 3.3 g/dL — ABNORMAL LOW (ref 3.5–5.2)
Alkaline Phosphatase: 75 U/L (ref 39–117)
BUN: 19 mg/dL (ref 6–23)
CO2: 19 meq/L (ref 19–32)
Calcium: 9.2 mg/dL (ref 8.4–10.5)
Chloride: 103 meq/L (ref 96–112)
Creatinine, Ser: 1.5 mg/dL — ABNORMAL HIGH (ref 0.50–1.35)
GFR calc Af Amer: 55 mL/min — ABNORMAL LOW
GFR calc non Af Amer: 47 mL/min — ABNORMAL LOW
Glucose, Bld: 104 mg/dL — ABNORMAL HIGH (ref 70–99)
Potassium: 4.4 meq/L (ref 3.5–5.1)
Sodium: 138 meq/L (ref 135–145)
Total Bilirubin: 0.4 mg/dL (ref 0.3–1.2)
Total Protein: 7 g/dL (ref 6.0–8.3)

## 2011-11-10 LAB — PROTIME-INR
INR: 1.1 (ref 0.00–1.49)
INR: 1.11 (ref 0.00–1.49)
Prothrombin Time: 14.4 seconds (ref 11.6–15.2)
Prothrombin Time: 14.5 s (ref 11.6–15.2)

## 2011-11-10 LAB — LACTIC ACID, PLASMA: Lactic Acid, Venous: 1 mmol/L (ref 0.5–2.2)

## 2011-11-10 LAB — DIFFERENTIAL
Basophils Absolute: 0 10*3/uL (ref 0.0–0.1)
Basophils Absolute: 0 10*3/uL (ref 0.0–0.1)
Basophils Relative: 0 % (ref 0–1)
Eosinophils Relative: 1 % (ref 0–5)
Lymphocytes Relative: 16 % (ref 12–46)
Lymphocytes Relative: 14 % (ref 12–46)
Lymphs Abs: 1.8 10*3/uL (ref 0.7–4.0)
Monocytes Absolute: 1.6 10*3/uL — ABNORMAL HIGH (ref 0.1–1.0)
Monocytes Absolute: 1.7 10*3/uL — ABNORMAL HIGH (ref 0.1–1.0)
Monocytes Relative: 13 % — ABNORMAL HIGH (ref 3–12)
Monocytes Relative: 13 % — ABNORMAL HIGH (ref 3–12)
Neutro Abs: 8.7 10*3/uL — ABNORMAL HIGH (ref 1.7–7.7)
Neutrophils Relative %: 71 % (ref 43–77)

## 2011-11-10 LAB — CARDIAC PANEL(CRET KIN+CKTOT+MB+TROPI)
CK, MB: 2.9 ng/mL (ref 0.3–4.0)
Relative Index: 1.6 (ref 0.0–2.5)
Total CK: 182 U/L (ref 7–232)
Troponin I: <0.3 ng/mL

## 2011-11-10 LAB — CBC
HCT: 39.4 % (ref 39.0–52.0)
HCT: 38.1 % — ABNORMAL LOW (ref 39.0–52.0)
Hemoglobin: 13.2 g/dL (ref 13.0–17.0)
Hemoglobin: 13 g/dL (ref 13.0–17.0)
MCHC: 33.5 g/dL (ref 30.0–36.0)
MCV: 90.5 fL (ref 78.0–100.0)
RBC: 4.32 MIL/uL (ref 4.22–5.81)
RDW: 14.4 % (ref 11.5–15.5)
WBC: 12.3 10*3/uL — ABNORMAL HIGH (ref 4.0–10.5)
WBC: 13.3 10*3/uL — ABNORMAL HIGH (ref 4.0–10.5)

## 2011-11-10 LAB — APTT: aPTT: 32 s (ref 24–37)

## 2011-11-10 LAB — PHOSPHORUS: Phosphorus: 3.3 mg/dL (ref 2.3–4.6)

## 2011-11-10 LAB — BASIC METABOLIC PANEL
BUN: 20 mg/dL (ref 6–23)
Chloride: 104 mEq/L (ref 96–112)
GFR calc Af Amer: 55 mL/min — ABNORMAL LOW (ref >90)
GFR calc non Af Amer: 48 mL/min — ABNORMAL LOW (ref >90)
Potassium: 4.2 mEq/L (ref 3.5–5.1)
Sodium: 137 mEq/L (ref 135–145)

## 2011-11-10 LAB — MRSA PCR SCREENING: MRSA by PCR: NEGATIVE

## 2011-11-10 LAB — PRO B NATRIURETIC PEPTIDE: Pro B Natriuretic peptide (BNP): 603.7 pg/mL — ABNORMAL HIGH (ref 0–125)

## 2011-11-10 LAB — D-DIMER, QUANTITATIVE: D-Dimer, Quant: >20 ug/mL-FEU — ABNORMAL HIGH (ref 0.00–0.48)

## 2011-11-10 LAB — MAGNESIUM: Magnesium: 1.8 mg/dL (ref 1.5–2.5)

## 2011-11-10 LAB — GLUCOSE, CAPILLARY: Glucose-Capillary: 102 mg/dL — ABNORMAL HIGH (ref 70–99)

## 2011-11-10 MED ORDER — HEPARIN BOLUS VIA INFUSION
5000.0000 [IU] | Freq: Once | INTRAVENOUS | Status: AC
  Administered 2011-11-10: 5000 [IU] via INTRAVENOUS

## 2011-11-10 MED ORDER — FENTANYL CITRATE 0.05 MG/ML IJ SOLN
INTRAMUSCULAR | Status: AC
  Filled 2011-11-10: qty 2

## 2011-11-10 MED ORDER — FENTANYL CITRATE 0.05 MG/ML IJ SOLN
50.0000 ug | INTRAMUSCULAR | Status: DC | PRN
  Administered 2011-11-10: 100 ug via INTRAVENOUS
  Administered 2011-11-11 (×4): 50 ug via INTRAVENOUS
  Administered 2011-11-11 (×2): 100 ug via INTRAVENOUS
  Administered 2011-11-11: 50 ug via INTRAVENOUS
  Administered 2011-11-12 (×3): 100 ug via INTRAVENOUS
  Filled 2011-11-10 (×11): qty 2

## 2011-11-10 MED ORDER — IOHEXOL 350 MG/ML SOLN
100.0000 mL | Freq: Once | INTRAVENOUS | Status: AC | PRN
  Administered 2011-11-10: 100 mL via INTRAVENOUS

## 2011-11-10 MED ORDER — PANTOPRAZOLE SODIUM 40 MG IV SOLR
40.0000 mg | Freq: Every day | INTRAVENOUS | Status: DC
  Administered 2011-11-10: 40 mg via INTRAVENOUS
  Filled 2011-11-10 (×2): qty 40

## 2011-11-10 MED ORDER — SODIUM CHLORIDE 0.9 % IV SOLN
INTRAVENOUS | Status: DC
  Administered 2011-11-10 – 2011-11-13 (×2): via INTRAVENOUS
  Administered 2011-11-15: 10 mL/h via INTRAVENOUS

## 2011-11-10 MED ORDER — HEPARIN (PORCINE) IN NACL 100-0.45 UNIT/ML-% IJ SOLN
1400.0000 [IU]/h | INTRAMUSCULAR | Status: DC
  Administered 2011-11-10: 1400 [IU]/h via INTRAVENOUS
  Filled 2011-11-10 (×2): qty 250

## 2011-11-10 MED ORDER — SODIUM CHLORIDE 0.9 % IV SOLN
250.0000 mL | INTRAVENOUS | Status: DC | PRN
  Administered 2011-11-13: 250 mL via INTRAVENOUS
  Administered 2011-11-15: 10 mL/h via INTRAVENOUS
  Administered 2011-11-18: 250 mL via INTRAVENOUS

## 2011-11-10 MED ORDER — FENTANYL CITRATE 0.05 MG/ML IJ SOLN
100.0000 ug | Freq: Once | INTRAMUSCULAR | Status: AC
  Administered 2011-11-10: 100 ug via INTRAVENOUS

## 2011-11-10 MED ORDER — FENTANYL CITRATE 0.05 MG/ML IJ SOLN: 100.0000 ug | Freq: Once | INTRAMUSCULAR | Status: DC

## 2011-11-10 NOTE — H&P (Signed)
Name: Kyle Patel MRN: [id number] DOB: February 19, 1946    LOS: 0  Referring Provider:  Adriana Simas MD EDP Reason for Referral:  Pulmonary embolism  PULMONARY / CRITICAL CARE MEDICINE  HPI:  This is a 66 y/o male with a past medical history of HTN who presented to the [facility] ED on 6/17 with several days of shortness of breath and was found to have a large pulmonary embolism.  He states that for 5 to 6 days prior to admission he had noted near constant dyspnea and a pain in his R chest that was worse with lying flat.  He denies cough, fevers, chills.  He went to urgent care on 6/17 and was referred to the ED for further management.  He reports no preceding weight loss, unexplained pain or masses, surgeries, long travel, etc.  His age appropriate screening for colon and prostate cancer are up to date by his PCP (per patient).  He has noted several days of R leg pain.  He recently retired as a Curator and remains active daily.  Past Medical History  Diagnosis Date  . Hypertension   . Hypercholesteremia    Past Surgical History  Procedure Date  . Abdominal surgery    Prior to Admission medications   Medication Sig Start Date End Date Taking? Authorizing Provider  aspirin EC 81 MG tablet Take 81 mg by mouth daily.   Yes Historical Provider, MD  cloNIDine (CATAPRES) 0.1 MG tablet Take 0.1 mg by mouth 2 (two) times daily.   Yes Historical Provider, MD  diltiazem (CARDIZEM CD) 120 MG 24 hr capsule Take 120 mg by mouth daily.   Yes Historical Provider, MD  ezetimibe-simvastatin (VYTORIN) 10-40 MG per tablet Take 1 tablet by mouth daily.   Yes Historical Provider, MD  fish oil-omega-3 fatty acids 1000 MG capsule Take 3 g by mouth every morning.   Yes Historical Provider, MD  Multiple Vitamins-Minerals (CENTRUM PO) Take 1 tablet by mouth every morning.   Yes Historical Provider, MD  Polyethyl Glycol-Propyl Glycol (SYSTANE OP) Apply 1 drop to eye at bedtime.   Yes Historical Provider, MD  potassium chloride SA  (K-DUR,KLOR-CON) 20 MEQ tablet Take 20 mEq by mouth daily.   Yes Historical Provider, MD   Allergies No Known Allergies  Family History History reviewed. No pertinent family history. Social History  reports that he has quit smoking. He does not have any smokeless tobacco history on file. He reports that he does not drink alcohol or use illicit drugs.  Review Of Systems:   Gen: Denies fever, chills, weight change, fatigue, night sweats HEENT: Denies blurred vision, double vision, hearing loss, tinnitus, sinus congestion, rhinorrhea, sore throat, neck stiffness, dysphagia PULM: per hpi CV: Notes mild chest discomfort, but denies edema, orthopnea, paroxysmal nocturnal dyspnea, palpitations GI: Denies abdominal pain, nausea, vomiting, diarrhea, hematochezia, melena, constipation, change in bowel habits GU: Denies dysuria, hematuria, polyuria, oliguria, urethral discharge Endocrine: Denies hot or cold intolerance, polyuria, polyphagia or appetite change Derm: Denies rash, dry skin, scaling or peeling skin change Heme: Denies easy bruising, bleeding, bleeding gums Neuro: Denies headache, numbness, weakness, slurred speech, loss of memory or consciousness   Brief patient description:  66 y/o male with a past medical history of HTN who presented to the [facility] ED on 6/17 with several days of shortness of breath and was found to have a large pulmonary embolism.   Events Since Admission: 6/17 CT Angio Chest >> saddle pulmonary embolism, no pulmonary mass or lymphadenopathy  Current  Status:  Vital Signs: Temp:  [98 F (36.7 C)-99.3 F (37.4 C)] 98.9 F (37.2 C) (06/17 2115) Pulse Rate:  [93-104] 93  (06/17 2115) Resp:  [22-33] 31  (06/17 2115) BP: (123-151)/(65-94) 136/74 mmHg (06/17 2115) SpO2:  [92 %-100 %] 100 % (06/17 2115) Weight:  [84.823 kg (187 lb)] 84.823 kg (187 lb) (06/17 1430)  Physical Examination: Gen: resting comfortably, no acute distress HEENT: NCAT, PERRL, EOMi, OP clear,  neck supple without masses PULM: CTA B, speaking in complete sentences CV: RRR, no mgr, no JVD AB: BS+, soft, nontender, no hsm Ext: warm, no edema, no clubbing, no cyanosis Derm: no rash or skin breakdown Neuro: A&Ox4, CN II-XII intact, strength 5/5 in all 4 extremities   Active Problems:  Pulmonary embolism  Hypoxemia  Hypertension  CKD (chronic kidney disease)   ASSESSMENT AND PLAN  PULMONARY  6/17 CT Angio Chest >> saddle pulmonary embolism, no pulmonary mass or lymphadenopathy ETT:  n/a  A:  Acute large pulmonary embolism.  The embolism is large on CT angio, but the only evidence of RV strain at this point is a slightly elevated proBNP.  He is not tachycardic and his blood pressure is elevated (most recent 147/90) so he does not have hemodynamic compromise.  His oxygen saturation is normal on 94%.  Given these findings and the fact that he is breathing comfortably, will not administer thrombolytics as there is no clear evidence that this would provide a long term benefit. What is more concerning is the etiology of the clot. Mother had history of "phlebitis", details unclear. P:   -heparin protocol -f/u hypercoagulable work up -f/u 2D TTE -f/u LE doppler ultrasound  -Should his condition change there is currently no contraindication to thrombolysis -Will order CT head without contrast given complaint of headache this morning to ensure no large mass visible -would order CT abd/pelvis in AM with contrast to evaluate for occult malignancy if clinically stable   CARDIOVASCULAR  Lab 11/10/11 2053 11/10/11 2052 11/10/11 1512  TROPONINI -- <0.30 <0.30  LATICACIDVEN 1.0 -- --  PROBNP -- 603.7* --   ECG:  NSR with RV strain pattern Lines: peripheral  A: Acute pulmonary embolism without hemodynamic compromise; baseline hypertension P:  -see above -tele -restart home dilt and clonidine in AM  RENAL  Lab 11/10/11 1512  NA 137  K 4.2  CL 104  CO2 20  BUN 20    CREATININE 1.49*  CALCIUM 9.9  MG --  PHOS --   Intake/Output    None    Foley:    A:  CKD P:   -renal dosing of meds -monitor UOP -repeat BMET in AM  GASTROINTESTINAL No results found for this basename: AST:5,ALT:5,ALKPHOS:5,BILITOT:5,PROT:5,ALBUMIN:5 in the last 168 hours  A:  No acute issues P:   No intervention indicated  HEMATOLOGIC  Lab 11/10/11 2024 11/10/11 2005 11/10/11 1512  HGB 13.0 -- 13.2  HCT 38.1* -- 39.4  PLT 151 -- 157  INR -- 1.10 --  APTT -- -- --   A:  Acute pulmonary embolism P:  -see discussion of hypercoagulability work up below  INFECTIOUS  Lab 11/10/11 2024 11/10/11 1512  WBC 13.3* 12.3*  PROCALCITON -- --   Cultures: none Antibiotics: none  A:  No evidence of infection P:   -monitor fever, WBC  ENDOCRINE  Lab 11/10/11 2113  GLUCAP 102*   A:  No acute issues P:   -monitor glucose  NEUROLOGIC  A:  Headache P:   -CT  head   BEST PRACTICE / DISPOSITION Level of Care:   Primary Service:  PCCM Consultants:   Code Status:  Full Diet:  regular DVT Px:  Hep Gtt GI Px:  Not indicated Skin Integrity:  normal Social / Family:  Updated at bedside  Max Fickle, M.D. Pulmonary and Critical Care Medicine [facility] Pager: [phone]  11/10/2011, 9:47 PM

## 2011-11-10 NOTE — ED Notes (Signed)
Phlebotomist at patient bedside at this time for collection of Hypercoagulable panel.

## 2011-11-10 NOTE — ED Provider Notes (Signed)
History     CSN: [id number]  Arrival date & time 11/10/11  1427   First MD Initiated Contact with Patient 11/10/11 1506      Chief Complaint  Patient presents with  . Chest Pain    (Consider location/radiation/quality/duration/timing/severity/associated sxs/prior treatment) HPI  Past Medical History  Diagnosis Date  . Hypertension   . Hypercholesteremia     Past Surgical History  Procedure Date  . Abdominal surgery     History reviewed. No pertinent family history.  History  Substance Use Topics  . Smoking status: Former Games developer  . Smokeless tobacco: Not on file  . Alcohol Use: No      Review of Systems  Allergies  Review of patient's allergies indicates no known allergies.  Home Medications  No current outpatient prescriptions on file.  BP 145/81  Pulse 100  Temp 98 F (36.7 C) (Oral)  Resp 22  Ht 5\' 9"  (1.753 m)  Wt 187 lb (84.823 kg)  BMI 27.62 kg/m2  SpO2 96%  Physical Exam  ED Course  Procedures (including critical care time)  Labs Reviewed - No data to display No results found.   No diagnosis found.  CRITICAL CARE Performed by: Donnetta Hutching   Total critical care time: 30  Critical care time was exclusive of separately billable procedures and treating other patients.  Critical care was necessary to treat or prevent imminent or life-threatening deterioration.  Critical care was time spent personally by me on the following activities: development of treatment plan with patient and/or surrogate as well as nursing, discussions with consultants, evaluation of patient's response to treatment, examination of patient, obtaining history from patient or surrogate, ordering and performing treatments and interventions, ordering and review of laboratory studies, ordering and review of radiographic studies, pulse oximetry and re-evaluation of patient's condition.  MDM  I personally performed the services described in this documentation, which was  scribed in my presence. The recorded information has been reviewed and considered.         Donnetta Hutching, MD 12/01/11 1400

## 2011-11-10 NOTE — ED Notes (Signed)
Pt waiting on test result. Pt still complain of pain in right side of chest when taking a deep breath

## 2011-11-10 NOTE — Progress Notes (Signed)
ANTICOAGULATION CONSULT NOTE - Initial Consult  Pharmacy Consult for heparin infusion Indication: pulmonary embolus  No Known Allergies  Patient Measurements: Height: 5\' 9"  (175.3 cm) Weight: 187 lb (84.823 kg) IBW/kg (Calculated) : 70.7  Heparin Dosing Weight: 84.8 kg  Vital Signs: Temp: 99.3 F (37.4 C) (06/17 1948) Temp src: Oral (06/17 1948) BP: 151/78 mmHg (06/17 2008) Pulse Rate: 98  (06/17 2008)  Labs:  Basename 11/10/11 1512  HGB 13.2  HCT 39.4  PLT 157  APTT --  LABPROT --  INR --  HEPARINUNFRC --  CREATININE 1.49*  CKTOTAL --  CKMB --  TROPONINI <0.30    Estimated Creatinine Clearance: 50.1 ml/min (by C-G formula based on Cr of 1.49).   Medical History: Past Medical History  Diagnosis Date  . Hypertension   . Hypercholesteremia       Assessment: 66 yo male with elevated d-dimer and CT angio chest with acute pulmonary embolism (saddle embolus at pulmonary artery bifurcation) to start on heparin infusion for pulmonary embolism. Hypercoaguable panel ordered. CrCl ~50, 84.8 kg, cbc ok.   Goal of Therapy:  Heparin level 0.3-0.7 units/ml Monitor platelets by anticoagulation protocol: Yes   Plan:  Give 5000 units bolus x 1 Start heparin infusion at 1400 units/hr Check anti-Xa level in 6 hours and daily while on heparin Continue to monitor H&H and platelets daily  Concha Norway 11/10/2011,8:25 PM

## 2011-11-10 NOTE — ED Notes (Signed)
Right chest that radiates to back, left thigh/calf worse than right since last Wednesday.  Pt is from [facility], here for  CTPA completed, results +. 89% on 3L o2, now on 5-6L o2.

## 2011-11-10 NOTE — ED Notes (Signed)
Sent from Urgent care.  Pain rt side of chest, sob, lt thigh pain  .  No cough, no fever,  Headache present.

## 2011-11-10 NOTE — ED Notes (Signed)
O2 sats dropped to mid 80's on RA. O2 at 2L applied with sat immediately rising to 92%

## 2011-11-10 NOTE — ED Provider Notes (Signed)
History   This chart was scribed for Donnetta Hutching, MD by Toya Smothers. The patient was seen in room APA02/APA02. Patient's care was started at 1427.  CSN: [id number]  Arrival date & time 11/10/11  1427   First MD Initiated Contact with Patient 11/10/11 1506     Chief Complaint  Patient presents with  . Chest Pain   The history is provided by the patient and the spouse. No language interpreter was used.   Kyle Patel is a 66 y.o. male who presents to the Emergency Department complaining of sudden onset moderate severe sharp constant R lateral chest wall soreness since Wednesday.  h/o high blood pressure and high cholesterol.  states pain began 5 days ago, after working outside in the heat. Pt denies increased diaphoesis, but began feeling SOB and nauseated c vomiting 3 days ago. This morning Pt was evaluated at Urgent care in [place], receiving a chest x-ray with instructions to visit the Emergency department.  Pt list PCP as Dr. Juanetta Gosling  Past Medical History  Diagnosis Date  . Hypertension   . Hypercholesteremia    Past Surgical History  Procedure Date  . Abdominal surgery    History reviewed. No pertinent family history.  History  Substance Use Topics  . Smoking status: Former Games developer  . Smokeless tobacco: Not on file  . Alcohol Use: No   Review of Systems  Constitutional: Negative for fever and chills.  HENT: Negative for rhinorrhea and neck pain.   Eyes: Negative for pain.  Respiratory: Positive for shortness of breath. Negative for cough.   Cardiovascular: Positive for chest pain.  Gastrointestinal: Positive for nausea and vomiting. Negative for abdominal pain and diarrhea.  Genitourinary: Negative for dysuria.  Musculoskeletal: Negative for back pain.  Skin: Negative for rash.  Neurological: Negative for dizziness and weakness.    Allergies  Review of patient's allergies indicates no known allergies.  Home Medications  No current outpatient prescriptions on  file.  BP 145/81  Pulse 100  Temp 98 F (36.7 C) (Oral)  Resp 22  Ht 5\' 9"  (1.753 m)  Wt 187 lb (84.823 kg)  BMI 27.62 kg/m2  SpO2 96%  Physical Exam  Nursing note and vitals reviewed. Constitutional: He is oriented to person, place, and time. He appears well-developed and well-nourished. No distress.  HENT:  Head: Normocephalic and atraumatic.  Eyes: EOM are normal. Pupils are equal, round, and reactive to light.  Neck: Neck supple. No tracheal deviation present.  Cardiovascular: Normal rate.   Pulmonary/Chest: Effort normal. No respiratory distress.       R lateral chest wall tenderness.  Abdominal: Soft. He exhibits no distension.  Musculoskeletal: Normal range of motion. He exhibits no edema.  Neurological: He is alert and oriented to person, place, and time. No sensory deficit.  Skin: Skin is warm and dry.  Psychiatric: He has a normal mood and affect. His behavior is normal.    ED Course  Procedures (including critical care time) DIAGNOSTIC STUDIES: Oxygen Saturation is 96% on room air, adequate by my interpretation.    COORDINATION OF CARE: 1529-Ordered blood labs. 1610- Reviewed blood labs.  Labs Reviewed  BASIC METABOLIC PANEL - Abnormal; Notable for the following:    Glucose, Bld 114 (*)     Creatinine, Ser 1.49 (*)     GFR calc non Af Amer 48 (*)     GFR calc Af Amer 55 (*)     All other components within normal limits  CBC - Abnormal;  Notable for the following:    WBC 12.3 (*)     All other components within normal limits  DIFFERENTIAL - Abnormal; Notable for the following:    Neutro Abs 8.7 (*)     Monocytes Relative 13 (*)     Monocytes Absolute 1.6 (*)     All other components within normal limits  D-DIMER, QUANTITATIVE - Abnormal; Notable for the following:    D-Dimer, Quant >20.00 (*)     All other components within normal limits  TROPONIN I   No results found.  No diagnosis found.  Date: 11/10/2011  Rate: 86  Rhythm: normal sinus  rhythm  QRS Axis: normal  Intervals: normal  ST/T Wave abnormalities: normal  Conduction Disutrbances:left anterior fascicular block  Narrative Interpretation:   Old EKG Reviewed: changes noted    MDM  D-dimer elevated. Patient is hemodynamically stable.  Will transfer to [facility] cone for CT angio chest to rule out pulmonary embolus.  This was discussed with patient and his wife I personally performed the services described in this documentation, which was scribed in my presence. The recorded information has been reviewed and considered.    Donnetta Hutching, MD 11/10/11 1743

## 2011-11-11 ENCOUNTER — Encounter (HOSPITAL_COMMUNITY): Payer: Self-pay | Admitting: *Deleted

## 2011-11-11 ENCOUNTER — Inpatient Hospital Stay (HOSPITAL_COMMUNITY): Payer: 59

## 2011-11-11 DIAGNOSIS — R0602 Shortness of breath: Secondary | ICD-10-CM

## 2011-11-11 LAB — CARDIAC PANEL(CRET KIN+CKTOT+MB+TROPI)
CK, MB: 1.8 ng/mL (ref 0.3–4.0)
Relative Index: 1.5 (ref 0.0–2.5)
Total CK: 147 U/L (ref 7–232)
Troponin I: <0.3 ng/mL (ref <0.30)

## 2011-11-11 LAB — LUPUS ANTICOAGULANT PANEL
DRVVT: 36.8 secs (ref <45.1)
PTT Lupus Anticoagulant: 38.2 secs (ref 28.0–43.0)

## 2011-11-11 LAB — BASIC METABOLIC PANEL
Calcium: 9.2 mg/dL (ref 8.4–10.5)
GFR calc Af Amer: 58 mL/min — ABNORMAL LOW (ref >90)
GFR calc non Af Amer: 50 mL/min — ABNORMAL LOW (ref >90)
Potassium: 4.5 mEq/L (ref 3.5–5.1)
Sodium: 137 mEq/L (ref 135–145)

## 2011-11-11 LAB — CARDIOLIPIN ANTIBODIES, IGG, IGM, IGA
Anticardiolipin IgA: 2 APL U/mL — ABNORMAL LOW (ref <22)
Anticardiolipin IgG: 9 GPL U/mL — ABNORMAL LOW (ref <23)

## 2011-11-11 LAB — PROTEIN C ACTIVITY: Protein C Activity: 157 % — ABNORMAL HIGH (ref 75–133)

## 2011-11-11 LAB — CBC
Hemoglobin: 12.7 g/dL — ABNORMAL LOW (ref 13.0–17.0)
Platelets: 158 10*3/uL (ref 150–400)
RBC: 4.17 MIL/uL — ABNORMAL LOW (ref 4.22–5.81)

## 2011-11-11 LAB — PHOSPHORUS: Phosphorus: 3 mg/dL (ref 2.3–4.6)

## 2011-11-11 LAB — MAGNESIUM: Magnesium: 1.9 mg/dL (ref 1.5–2.5)

## 2011-11-11 LAB — HEPARIN LEVEL (UNFRACTIONATED): Heparin Unfractionated: 1.65 IU/mL — ABNORMAL HIGH (ref 0.30–0.70)

## 2011-11-11 MED ORDER — HEPARIN (PORCINE) IN NACL 100-0.45 UNIT/ML-% IJ SOLN
1200.0000 [IU]/h | INTRAMUSCULAR | Status: DC
  Administered 2011-11-11: 1200 [IU]/h via INTRAVENOUS
  Filled 2011-11-11: qty 250

## 2011-11-11 MED ORDER — HEPARIN (PORCINE) IN NACL 100-0.45 UNIT/ML-% IJ SOLN
1000.0000 [IU]/h | INTRAMUSCULAR | Status: DC
  Administered 2011-11-11 – 2011-11-12 (×3): 1000 [IU]/h via INTRAVENOUS
  Filled 2011-11-11 (×3): qty 250

## 2011-11-11 NOTE — Progress Notes (Signed)
Name: Kyle Patel MRN: [id number] DOB: 16-Jun-1945    LOS: 1  Referring Provider:  Adriana Simas MD EDP Reason for Referral:  Pulmonary embolism  PULMONARY / CRITICAL CARE MEDICINE  HPI:  This is a 66 y/o male with a past medical history of HTN who presented to the [facility] ED on 6/17 with several days of shortness of breath and was found to have a large pulmonary embolism.  He states that for 5 to 6 days prior to admission he had noted near constant dyspnea and a pain in his R chest that was worse with lying flat.  He denies cough, fevers, chills.  He went to urgent care on 6/17 and was referred to the ED for further management.  He reports no preceding weight loss, unexplained pain or masses, surgeries, long travel, etc.  His age appropriate screening for colon and prostate cancer are up to date by his PCP (per patient).  He has noted several days of R leg pain.  He recently retired as a Curator and remains active daily.  Events Since Admission: 6/17 CT Angio Chest >> saddle pulmonary embolism, no pulmonary mass or lymphadenopathy  Current Status: Pain better, requires 4-5 L [place], B stable overnight  Vital Signs: Temp:  [98 F (36.7 C)-99.7 F (37.6 C)] 98 F (36.7 C) (06/18 1154) Pulse Rate:  [87-109] 106  (06/18 1300) Resp:  [19-37] 35  (06/18 1300) BP: (123-152)/(65-98) 139/68 mmHg (06/18 1300) SpO2:  [89 %-100 %] 91 % (06/18 1300) FiO2 (%):  [32 %] 32 % (06/17 2031) Weight:  [79.9 kg (176 lb 2.4 oz)-84.823 kg (187 lb)] 79.9 kg (176 lb 2.4 oz) (06/18 0500)  Physical Examination: Gen: resting comfortably, no acute distress HEENT: NCAT, PERRL, EOMi, OP clear, neck supple without masses PULM: CTA B, speaking in complete sentences CV: RRR, no mgr, no JVD AB: BS+, soft, nontender, no hsm Ext: warm, no edema, no clubbing, no cyanosis Derm: no rash or skin breakdown Neuro: A&Ox4, CN II-XII intact, strength 5/5 in all 4 extremities   Active Problems:  Pulmonary embolism  Hypoxemia  Hypertension  CKD (chronic kidney disease)   ASSESSMENT AND PLAN  PULMONARY  6/17 CT Angio Chest >> saddle pulmonary embolism, no pulmonary mass or lymphadenopathy 6/18 duplex >>positive for DVT in the left femoral, popliteal, and posterior tibial veins.  Echo 6/18 >> RV dysfunction  ETT:  n/a  A:  Acute large pulmonary embolism.  No hemodynamic compromise.   No obvious risk factor Mother had history of "phlebitis", details unclear. P:   -heparin protocol -f/u hypercoagulable work up -Should his condition change there is currently no contraindication to thrombolysis - CT head neg -Consider  CT abd/pelvis with PO contrast to screen for occult malignancy   CARDIOVASCULAR  Lab 11/11/11 1302 11/11/11 0430 11/10/11 2053 11/10/11 2052 11/10/11 1512  TROPONINI <0.30 <0.30 -- <0.30 <0.30  LATICACIDVEN -- -- 1.0 -- --  PROBNP -- -- -- 603.7* --   ECG:  NSR with RV strain pattern Lines: peripheral  A: Acute pulmonary embolism without hemodynamic compromise; baseline hypertension P:  -tele -restart home dilt and clonidine if BP stable x 24h  RENAL  Lab 11/11/11 0430 11/10/11 2024 11/10/11 1512  NA 137 138 137  K 4.5 4.4 --  CL 104 103 104  CO2 19 19 20   BUN 19 19 20   CREATININE 1.43* 1.50* 1.49*  CALCIUM 9.2 9.2 9.9  MG 1.9 1.8 --  PHOS 3.0 3.3 --   Intake/Output  06/17 0701 - 06/18 0700 06/18 0701 - 06/19 0700   I.V. (mL/kg) 250 (3.1) 132 (1.7)   Total Intake(mL/kg) 250 (3.1) 132 (1.7)   Urine (mL/kg/hr) 925 (0.5)    Total Output 925    Net -675 +132         Foley:    A:  CKD P:   -renal dosing of meds -monitor UOP -repeat BMET in AM   HEMATOLOGIC  Lab 11/11/11 0430 11/10/11 2024 11/10/11 2005 11/10/11 1512  HGB 12.7* 13.0 -- 13.2  HCT 37.5* 38.1* -- 39.4  PLT 158 151 -- 157  INR -- 1.11 1.10 --  APTT -- 32 -- --   A:  Acute pulmonary embolism P:  -see discussion of hypercoagulability work up below   [employer] PRACTICE / DISPOSITION Level of  Care:  SDU Primary Service:  PCCM Consultants:   Code Status:  Full Diet:  regular DVT Px:  Hep Gtt GI Px:  Not indicated Skin Integrity:  normal Social / Family:  Updated at bedside  Oretha Milch., M.D. Pulmonary and Critical Care Medicine [facility] Pager: [phone]  11/11/2011, 2:27 PM

## 2011-11-11 NOTE — Progress Notes (Signed)
ANTICOAGULATION CONSULT NOTE - Follow Up Consult  Pharmacy Consult for heparin Indication: DVT/PE  No Known Allergies  Patient Measurements: Height: 5\' 9"  (175.3 cm) Weight: 176 lb 2.4 oz (79.9 kg) IBW/kg (Calculated) : 70.7  Heparin Dosing Weight: 80 kg  Vital Signs: Temp: 99.5 F (37.5 C) (06/18 2000) Temp src: Oral (06/18 2000) BP: 148/74 mmHg (06/18 2000) Pulse Rate: 113  (06/18 2000)  Labs:  Basename 11/11/11 1302 11/11/11 1301 11/11/11 0430 11/10/11 2052 11/10/11 2024 11/10/11 2005 11/10/11 1512  HGB -- -- 12.7* -- 13.0 -- --  HCT -- -- 37.5* -- 38.1* -- 39.4  PLT -- -- 158 -- 151 -- 157  APTT -- -- -- -- 32 -- --  LABPROT -- -- -- -- 14.5 14.4 --  INR -- -- -- -- 1.11 1.10 --  HEPARINUNFRC -- 1.19* 1.65* -- -- -- --  CREATININE -- -- 1.43* -- 1.50* -- 1.49*  CKTOTAL 123 -- 147 182 -- -- --  CKMB 1.8 -- 2.2 2.9 -- -- --  TROPONINI <0.30 -- <0.30 <0.30 -- -- --    Estimated Creatinine Clearance: 52.2 ml/min (by C-G formula based on Cr of 1.43).   Medications:  Scheduled:    . heparin  5,000 Units Intravenous Once  . DISCONTD: pantoprazole (PROTONIX) IV  40 mg Intravenous QHS   Infusions:    . sodium chloride 10 mL/hr at 11/10/11 2300  . heparin 1,000 Units/hr (11/11/11 1447)  . DISCONTD: heparin 1,400 Units/hr (11/10/11 2300)  . DISCONTD: heparin 1,200 Units/hr (11/11/11 1242)    Assessment: 66 yo male with PE/DVT is currently on therapeutic heparin.  Heparin level is 0.55. Goal of Therapy:  Heparin level 0.3-0.7 units/ml Monitor platelets by anticoagulation protocol: Yes   Plan:  1) Cont heparin at 1000 units/hr (= 10 ml/hr) 2) Heparin level and CBC in am.  Kelyn Ponciano, Tsz-Yin 11/11/2011,9:37 PM

## 2011-11-11 NOTE — Progress Notes (Signed)
Bilateral lower extremity venous duplex completed.  Preliminary report is positive for DVT in the left femoral, popliteal, and posterior tibial veins.  Negative for DVT in the right leg.

## 2011-11-11 NOTE — Progress Notes (Signed)
ANTICOAGULATION CONSULT NOTE - Follow Up Consult  Pharmacy Consult for heparin Indication: pulmonary embolus/DVT  Labs:  Basename 11/11/11 1301 11/11/11 0430 11/10/11 2052 11/10/11 2024 11/10/11 2005 11/10/11 1512  HGB -- 12.7* -- 13.0 -- --  HCT -- 37.5* -- 38.1* -- 39.4  PLT -- 158 -- 151 -- 157  APTT -- -- -- 32 -- --  LABPROT -- -- -- 14.5 14.4 --  INR -- -- -- 1.11 1.10 --  HEPARINUNFRC 1.19* 1.65* -- -- -- --  CREATININE -- 1.43* -- 1.50* -- 1.49*  CKTOTAL -- 147 182 -- -- --  CKMB -- 2.2 2.9 -- -- --  TROPONINI -- <0.30 <0.30 -- -- <0.30    Assessment: 66yo male remains supratherapeutic on heparin for saddle PE and DVT.  No overt bleeding noted per RN.  Goal of Therapy:  Heparin level 0.3-0.7 units/ml   Plan:  Will hold heparin gtt x1hr then resume at 1000 units/hr F/up 6 hour heparin level after restarting  Rolland Porter, Pharm.D., BCPS Clinical Pharmacist Pager: [phone] 11/11/2011,1:52 PM

## 2011-11-11 NOTE — Progress Notes (Signed)
  Echocardiogram 2D Echocardiogram has been performed.  Jorje Guild [facility] 11/11/2011, 9:42 AM

## 2011-11-11 NOTE — Progress Notes (Signed)
ANTICOAGULATION CONSULT NOTE - Follow Up Consult  Pharmacy Consult for heparin Indication: pulmonary embolus  Labs:  Basename 11/11/11 0430 11/10/11 2052 11/10/11 2024 11/10/11 2005 11/10/11 1512  HGB -- -- 13.0 -- 13.2  HCT -- -- 38.1* -- 39.4  PLT -- -- 151 -- 157  APTT -- -- 32 -- --  LABPROT -- -- 14.5 14.4 --  INR -- -- 1.11 1.10 --  HEPARINUNFRC 1.65* -- -- -- --  CREATININE -- -- 1.50* -- 1.49*  CKTOTAL -- 182 -- -- --  CKMB -- 2.9 -- -- --  TROPONINI -- <0.30 -- -- <0.30    Assessment: 66yo male supratherapeutic on heparin with initial dosing for large PE.  Goal of Therapy:  Heparin level 0.3-0.7 units/ml   Plan:  Will hold heparin gtt x1hr then resume at 3 units/kg/hr lower (1200 units/hr) and check level in 6hr.  Colleen Can PharmD BCPS 11/11/2011,5:08 AM

## 2011-11-12 ENCOUNTER — Inpatient Hospital Stay (HOSPITAL_COMMUNITY): Payer: 59

## 2011-11-12 LAB — URINALYSIS, ROUTINE W REFLEX MICROSCOPIC
Glucose, UA: NEGATIVE mg/dL
Hgb urine dipstick: NEGATIVE
Ketones, ur: NEGATIVE mg/dL
Specific Gravity, Urine: 1.025 (ref 1.005–1.030)
pH: 5.5 (ref 5.0–8.0)

## 2011-11-12 LAB — BASIC METABOLIC PANEL
BUN: 21 mg/dL (ref 6–23)
CO2: 20 mEq/L (ref 19–32)
GFR calc non Af Amer: 44 mL/min — ABNORMAL LOW (ref >90)
Glucose, Bld: 149 mg/dL — ABNORMAL HIGH (ref 70–99)
Potassium: 4.6 mEq/L (ref 3.5–5.1)

## 2011-11-12 LAB — URINE MICROSCOPIC-ADD ON

## 2011-11-12 LAB — RHEUMATOID FACTOR: Rhuematoid fact SerPl-aCnc: 16 IU/mL — ABNORMAL HIGH (ref <=14)

## 2011-11-12 LAB — CBC
HCT: 35.4 % — ABNORMAL LOW (ref 39.0–52.0)
Hemoglobin: 12.1 g/dL — ABNORMAL LOW (ref 13.0–17.0)
MCHC: 34.2 g/dL (ref 30.0–36.0)
MCV: 89.6 fL (ref 78.0–100.0)
RDW: 13.9 % (ref 11.5–15.5)
WBC: 15.7 10*3/uL — ABNORMAL HIGH (ref 4.0–10.5)

## 2011-11-12 LAB — FACTOR 5 LEIDEN

## 2011-11-12 LAB — LACTIC ACID, PLASMA: Lactic Acid, Venous: 1.3 mmol/L (ref 0.5–2.2)

## 2011-11-12 LAB — ANGIOTENSIN CONVERTING ENZYME: Angiotensin-Converting Enzyme: 19 U/L (ref 8–52)

## 2011-11-12 LAB — PROCALCITONIN: Procalcitonin: 0.76 ng/mL

## 2011-11-12 MED ORDER — OXYCODONE-ACETAMINOPHEN 5-325 MG PO TABS
1.0000 | ORAL_TABLET | Freq: Four times a day (QID) | ORAL | Status: DC | PRN
  Administered 2011-11-12 – 2011-11-15 (×5): 2 via ORAL
  Filled 2011-11-12 (×5): qty 2

## 2011-11-12 MED ORDER — DILTIAZEM HCL 30 MG PO TABS
30.0000 mg | ORAL_TABLET | Freq: Four times a day (QID) | ORAL | Status: DC
  Administered 2011-11-12 – 2011-11-19 (×29): 30 mg via ORAL
  Filled 2011-11-12 (×33): qty 1

## 2011-11-12 MED ORDER — FENTANYL CITRATE 0.05 MG/ML IJ SOLN
100.0000 ug | Freq: Once | INTRAMUSCULAR | Status: AC
  Administered 2011-11-12: 100 ug via INTRAVENOUS

## 2011-11-12 MED ORDER — LEVOFLOXACIN IN D5W 750 MG/150ML IV SOLN
750.0000 mg | INTRAVENOUS | Status: DC
  Administered 2011-11-12: 750 mg via INTRAVENOUS
  Filled 2011-11-12: qty 150

## 2011-11-12 MED ORDER — SODIUM CHLORIDE 0.9 % IV SOLN
3.0000 g | Freq: Four times a day (QID) | INTRAVENOUS | Status: DC
  Administered 2011-11-12 – 2011-11-15 (×11): 3 g via INTRAVENOUS
  Filled 2011-11-12 (×14): qty 3

## 2011-11-12 NOTE — Progress Notes (Signed)
ANTICOAGULATION CONSULT NOTE - Follow Up Consult  Pharmacy Consult for heparin Indication: DVT/PE  No Known Allergies  Patient Measurements: Height: 5\' 9"  (175.3 cm) Weight: 189 lb 6 oz (85.9 kg) IBW/kg (Calculated) : 70.7  Heparin Dosing Weight: 80 kg  Vital Signs: Temp: 100.2 F (37.9 C) (06/19 0347) Temp src: Oral (06/19 0700) BP: 145/68 mmHg (06/19 0700) Pulse Rate: 117  (06/19 0700)  Labs:  Alvira Philips 11/12/11 0420 11/11/11 2120 11/11/11 1302 11/11/11 1301 11/11/11 0430 11/10/11 2052 11/10/11 2024 11/10/11 2005  HGB 12.1* -- -- -- 12.7* -- -- --  HCT 35.4* -- -- -- 37.5* -- 38.1* --  PLT 179 -- -- -- 158 -- 151 --  APTT -- -- -- -- -- -- 32 --  LABPROT -- -- -- -- -- -- 14.5 14.4  INR -- -- -- -- -- -- 1.11 1.10  HEPARINUNFRC 0.59 0.55 -- 1.19* -- -- -- --  CREATININE 1.61* -- -- -- 1.43* -- 1.50* --  CKTOTAL -- -- 123 -- 147 182 -- --  CKMB -- -- 1.8 -- 2.2 2.9 -- --  TROPONINI -- -- <0.30 -- <0.30 <0.30 -- --    Estimated Creatinine Clearance: 50.4 ml/min (by C-G formula based on Cr of 1.61).   Medications:  Scheduled:     . fentaNYL  100 mcg Intravenous Once  . DISCONTD: pantoprazole (PROTONIX) IV  40 mg Intravenous QHS   Infusions:     . sodium chloride 10 mL/hr at 11/10/11 2300  . heparin 1,000 Units/hr (11/11/11 1447)  . DISCONTD: heparin 1,200 Units/hr (11/11/11 1242)    Assessment: PE/DVT: Currently therapeutic on Heparin.  Hypercoagulable workup is partially complete and is unrevealing thus far.  Goal of Therapy:  Heparin level 0.3-0.7 units/ml Monitor platelets by anticoagulation protocol: Yes   Plan:  1) Continue heparin at 1000 units/hr (= 10 ml/hr) 2) Heparin level and CBC in am 3) Follow for remainder of hypercoagulable workup results 4) Anticipate transition to oral anticoagulation once invasive procedures are complete  Estella Husk, Pharm.D., BCPS Clinical Pharmacist  Phone [phone] Pager [phone] 11/12/2011, 10:00  AM

## 2011-11-12 NOTE — Progress Notes (Addendum)
Name: Kyle Patel MRN: [id number] DOB: [birth year]    LOS: 2  Referring Provider:  Adriana Simas MD EDP Reason for Referral:  Pulmonary embolism  PULMONARY / CRITICAL CARE MEDICINE  HPI:  This is a 66 y/o male with a past medical history of HTN who presented to the [facility] ED on 6/17 with several days of shortness of breath and was found to have a large pulmonary embolism.  He states that for 5 to 6 days prior to admission he had noted near constant dyspnea and a pain in his R chest that was worse with lying flat.  He denies cough, fevers, chills.  He went to urgent care on 6/17 and was referred to the ED for further management.  He reports no preceding weight loss, unexplained pain or masses, surgeries, long travel, etc.  His age appropriate screening for colon and prostate cancer are up to date by his PCP (per patient).  He has noted several days of R leg pain.  He recently retired as a Curator and remains active daily.  Events Since Admission: 6/17 CT Angio Chest >> saddle pulmonary embolism, no pulmonary mass or lymphadenopathy  Current Status: Pain better, requires 4-5 L [place], B stable overnight  Vital Signs: Temp:  [98 F (36.7 C)-100.5 F (38.1 C)] 100.2 F (37.9 C) (06/19 0347) Pulse Rate:  [95-117] 117  (06/19 0700) Resp:  [21-42] 26  (06/19 0700) BP: (129-165)/(67-92) 145/68 mmHg (06/19 0700) SpO2:  [90 %-96 %] 90 % (06/19 0700) Weight:  [85.5 kg (188 lb 7.9 oz)-85.9 kg (189 lb 6 oz)] 85.9 kg (189 lb 6 oz) (06/19 0300)  Physical Examination: Gen: resting comfortably, no acute distress HEENT: NCAT, PERRL, EOMi, OP clear, neck supple without masses PULM: CTA B, speaking in complete sentences CV: RRR, no mgr, no JVD AB: BS+, soft, nontender, no hsm Ext: warm, no edema, no clubbing, no cyanosis Derm: no rash or skin breakdown Neuro: A&Ox4, CN II-XII intact, strength 5/5 in all 4 extremities  Active Problems:  Pulmonary embolism  Hypoxemia  Hypertension  CKD (chronic kidney  disease)  ASSESSMENT AND PLAN  PULMONARY  6/17 CT Angio Chest>>>Saddle pulmonary embolism, no pulmonary mass or lymphadenopathy 6/18 duplex>>Positive for DVT in the left femoral, popliteal, and posterior tibial veins.  Echo 6/18 >> RV mildly dilated with PAP of 76 mmHg.  ETT:  N/A  A:  Acute large pulmonary embolism.  No hemodynamic compromise.   No obvious risk factor  P:   - Heparin protocol. - F/u hypercoagulable work up negative but will need additional work up once off sedation but all negative as of now. - Should his condition change there is currently no contraindication to thrombolysis. - CT head neg. - CT abd/pelvis with PO contrast to screen for occult malignancy negative for mass or lymphadenopathy.  - Pulmonary HTN is out of proportion to PE and chronic HTN, will send auto-immune work up. - IS per RT protocol. - Titrate down O2.  CARDIOVASCULAR  Lab 11/11/11 1302 11/11/11 0430 11/10/11 2053 11/10/11 2052 11/10/11 1512  TROPONINI <0.30 <0.30 -- <0.30 <0.30  LATICACIDVEN -- -- 1.0 -- --  PROBNP -- -- -- 603.7* --   ECG: NSR with RV strain pattern. Lines: Peripheral.  A: Acute pulmonary embolism without hemodynamic compromise; baseline hypertension.  PAP is 76 indicating likely chronic process rather than acute issue. P:  - Transfer to in AM if BP and O2 demand remains stable. - Restart home dilt but hold off clonidine until BP  is more stable.  RENAL  Lab 11/12/11 0420 11/11/11 0430 11/10/11 2024 11/10/11 1512  NA 133* 137 138 137  K 4.6 4.5 -- --  CL 101 104 103 104  CO2 20 19 19 20   BUN 21 19 19 20   CREATININE 1.61* 1.43* 1.50* 1.49*  CALCIUM 9.3 9.2 9.2 9.9  MG -- 1.9 1.8 --  PHOS -- 3.0 3.3 --   Intake/Output      06/18 0701 - 06/19 0700 06/19 0701 - 06/20 0700   P.O.  240   I.V. (mL/kg) 472 (5.5) 40 (0.5)   Total Intake(mL/kg) 472 (5.5) 280 (3.3)   Urine (mL/kg/hr) 650 (0.3)    Total Output 650    Net -178 +280         Foley:    A:  CKD,  CO2 improving. P:   -Renal dosing of meds -Monitor UOP -Repeat BMET in AM -Renal U/S ordered.  HEMATOLOGIC  Lab 11/12/11 0420 11/11/11 0430 11/10/11 2024 11/10/11 2005 11/10/11 1512  HGB 12.1* 12.7* 13.0 -- 13.2  HCT 35.4* 37.5* 38.1* -- 39.4  PLT 179 158 151 -- 157  INR -- -- 1.11 1.10 --  APTT -- -- 32 -- --   A:  Acute pulmonary embolism P:  -See discussion of hypercoagulability work up above -Will order lower ext dopplers  BEST PRACTICE / DISPOSITION Level of Care:  SDU Primary Service:  PCCM Consultants:   Code Status:  Full Diet:  regular DVT Px:  Hep Gtt GI Px:  Not indicated Skin Integrity:  normal Social / Family:  Updated at bedside  Koren Bound, M.D. Pulmonary and Critical Care Medicine [facility] Pager: [phone]  11/12/2011, 9:59 AM

## 2011-11-12 NOTE — Progress Notes (Addendum)
ANTIBIOTIC CONSULT NOTE - INITIAL  Pharmacy Consult for Levaquin>>unasyn Indication: empiric for fevers  No Known Allergies  Patient Measurements: Height: 5\' 9"  (175.3 cm) Weight: 189 lb 6 oz (85.9 kg) IBW/kg (Calculated) : 70.7   Vital Signs: Temp: 99.3 F (37.4 C) (06/19 1319) Temp src: Oral (06/19 1319) BP: 135/69 mmHg (06/19 1319) Pulse Rate: 104  (06/19 1319) Intake/Output from previous day: 06/18 0701 - 06/19 0700 In: 472 [I.V.:472] Out: 650 [Urine:650] Intake/Output from this shift: Total I/O In: 480 [P.O.:360; I.V.:120] Out: -   Labs:  Basename 11/12/11 0420 11/11/11 0430 11/10/11 2024  WBC 15.7* 13.1* 13.3*  HGB 12.1* 12.7* 13.0  PLT 179 158 151  LABCREA -- -- --  CREATININE 1.61* 1.43* 1.50*   Estimated Creatinine Clearance: 50.4 ml/min (by C-G formula based on Cr of 1.61). No results found for this basename: VANCOTROUGH:2,VANCOPEAK:2,VANCORANDOM:2,GENTTROUGH:2,GENTPEAK:2,GENTRANDOM:2,TOBRATROUGH:2,TOBRAPEAK:2,TOBRARND:2,AMIKACINPEAK:2,AMIKACINTROU:2,AMIKACIN:2, in the last 72 hours   Microbiology: Recent Results (from the past 720 hour(s))  MRSA PCR SCREENING     Status: Normal   Collection Time   11/10/11  9:11 PM      Component Value Range Status Comment   MRSA by PCR NEGATIVE  NEGATIVE Final     Medical History: Past Medical History  Diagnosis Date  . Hypertension   . Hypercholesteremia     Medications:  Anti-infectives    None     Assessment: Fevers:  To begin empiric antimicrobial therapy with Levaquin.  Note he has chronic renal insufficiency.  Levaquin requires adjustment if CrCl is <50 and his estimated creatinine clearance is slightly above this breakpoint.  Goal of Therapy:  Appropriate antimicrobial therapy  Plan:  Levaquin 750mg  IV q24h Monitor renal function closely - if creatinine rises his regimen may require adjustment.  Estella Husk, Pharm.D., BCPS Clinical Pharmacist  Phone [phone] Pager  [phone] 11/12/2011, 1:45 PM  Addendum: IV levaquin causing severe itching, new orders from CCM received to change therapy to unasyn and monitor for rash. No dose adjustments are warranted at this time.  Plan: D/c levaquin - will list as intolerance in allergy section Unasyn 3 g IV q 6 hours Continue to monitor fever curve, white count, and renal function for changes

## 2011-11-12 NOTE — Progress Notes (Signed)
Pt c/o r sided chest pain, non radiating, increases with deep inhalation.  HR and RR elevated.  Given Fentanyl at 0700hrs.  No pain med due until 1000hrs.  Dr. Merton Border notified order for 1x dose of fentanyl now.  Medication given, will monitor for changes in pain level.

## 2011-11-12 NOTE — Progress Notes (Signed)
eLink Physician-Brief Progress Note Patient Name: Kyle Patel DOB: 09/29/1945 MRN: [id number]  Date of Service  11/12/2011   HPI/Events of Note   Iv levauin causing severe itch  eICU Interventions  Dc iv levaquin Monitor for rash Check pct and lactate Start unasyn IV   Intervention Category Intermediate Interventions: Medication change / dose adjustment  Dandy Lazaro 11/12/2011, 5:13 PM

## 2011-11-12 NOTE — Progress Notes (Signed)
Patient medicated for pain located on right side, pain 8/10 on 0-10 pain scale.

## 2011-11-12 NOTE — Progress Notes (Signed)
Pt temp 101, Dr. Molli Knock notified.  Order for Blood cultures and urine culture received.  Pt instructed on use of IS, reaching only at this time.  Will continue to use q1h while awake.

## 2011-11-13 LAB — BASIC METABOLIC PANEL
BUN: 23 mg/dL (ref 6–23)
Chloride: 103 mEq/L (ref 96–112)
Creatinine, Ser: 1.68 mg/dL — ABNORMAL HIGH (ref 0.50–1.35)
GFR calc Af Amer: 48 mL/min — ABNORMAL LOW (ref >90)
GFR calc non Af Amer: 41 mL/min — ABNORMAL LOW (ref >90)
Potassium: 4.3 mEq/L (ref 3.5–5.1)

## 2011-11-13 LAB — CBC
MCV: 89 fL (ref 78.0–100.0)
Platelets: 190 10*3/uL (ref 150–400)
RBC: 3.65 MIL/uL — ABNORMAL LOW (ref 4.22–5.81)
RDW: 13.6 % (ref 11.5–15.5)
WBC: 15.1 10*3/uL — ABNORMAL HIGH (ref 4.0–10.5)

## 2011-11-13 LAB — URINE CULTURE: Culture  Setup Time: 201306191430

## 2011-11-13 LAB — MAGNESIUM: Magnesium: 2 mg/dL (ref 1.5–2.5)

## 2011-11-13 LAB — HEPARIN LEVEL (UNFRACTIONATED): Heparin Unfractionated: 0.35 IU/mL (ref 0.30–0.70)

## 2011-11-13 MED ORDER — WARFARIN VIDEO
Freq: Once | Status: AC
  Administered 2011-11-13: 14:00:00

## 2011-11-13 MED ORDER — WARFARIN SODIUM 7.5 MG PO TABS
7.5000 mg | ORAL_TABLET | Freq: Once | ORAL | Status: AC
  Administered 2011-11-13: 7.5 mg via ORAL
  Filled 2011-11-13: qty 1

## 2011-11-13 MED ORDER — WARFARIN - PHARMACIST DOSING INPATIENT: Freq: Every day | Status: DC

## 2011-11-13 MED ORDER — HEPARIN (PORCINE) IN NACL 100-0.45 UNIT/ML-% IJ SOLN
1150.0000 [IU]/h | INTRAMUSCULAR | Status: DC
  Administered 2011-11-13: 1050 [IU]/h via INTRAVENOUS
  Administered 2011-11-14 – 2011-11-19 (×6): 1150 [IU]/h via INTRAVENOUS
  Filled 2011-11-13 (×11): qty 250

## 2011-11-13 MED ORDER — PATIENT'S GUIDE TO USING COUMADIN BOOK
Freq: Once | Status: AC
  Administered 2011-11-13: 14:00:00
  Filled 2011-11-13: qty 1

## 2011-11-13 NOTE — Progress Notes (Addendum)
ANTICOAGULATION CONSULT NOTE - Follow Up Consult  Pharmacy Consult for heparin Indication: DVT/PE  Allergies  Allergen Reactions  . Levaquin (Levofloxacin) Itching    Patient Measurements: Height: 5\' 9"  (175.3 cm) Weight: 189 lb 2.5 oz (85.8 kg) IBW/kg (Calculated) : 70.7  Heparin Dosing Weight: 80 kg  Vital Signs: Temp: 99 F (37.2 C) (06/20 0820) Temp src: Oral (06/20 0820) BP: 128/69 mmHg (06/20 0820) Pulse Rate: 95  (06/20 0820)  Labs:  Kyle Patel 11/13/11 0420 11/12/11 0420 11/11/11 2120 11/11/11 1302 11/11/11 0430 11/10/11 2052 11/10/11 2024 11/10/11 2005  HGB 11.2* 12.1* -- -- -- -- -- --  HCT 32.5* 35.4* -- -- 37.5* -- -- --  PLT 190 179 -- -- 158 -- -- --  APTT -- -- -- -- -- -- 32 --  LABPROT -- -- -- -- -- -- 14.5 14.4  INR -- -- -- -- -- -- 1.11 1.10  HEPARINUNFRC 0.35 0.59 0.55 -- -- -- -- --  CREATININE 1.68* 1.61* -- -- 1.43* -- -- --  CKTOTAL -- -- -- 123 147 182 -- --  CKMB -- -- -- 1.8 2.2 2.9 -- --  TROPONINI -- -- -- <0.30 <0.30 <0.30 -- --    Estimated Creatinine Clearance: 48.2 ml/min (by C-G formula based on Cr of 1.68).   Medications:  Scheduled:     . ampicillin-sulbactam (UNASYN) IV  3 g Intravenous Q6H  . diltiazem  30 mg Oral Q6H  . DISCONTD: levofloxacin (LEVAQUIN) IV  750 mg Intravenous Q24H   Infusions:     . sodium chloride 10 mL/hr at 11/10/11 2300  . heparin 1,000 Units/hr (11/12/11 1400)    Assessment: PE/DVT: Currently therapeutic on Heparin -lower end of range.  Orders to start coumadin tonight, so today will be D1/5 for VTE overlap. Baseline INR = 1.11. Hypercoagulable workup is partially complete and is unrevealing thus far. Hgb -slowly trending down, platelets improving and WNL  ID: on empiric abx for leukocytosis and fevers.  levaquin changed to unasyn 6/19 PM d/t severe itching thought to be related to FQ.   Levaquin 6/19 >>6/19 Unasyn 6/19 >>  6/19 Blood x 2: NGTD 6/19 Urine: pending  Goal of Therapy:    Heparin level 0.3-0.7 units/ml Monitor platelets by anticoagulation protocol: Yes   Plan:  1) Due to level being at lower-end of goal with saddle PE, Increase heparin at 1050 units/hr (= 10.5 ml/hr) 2) Coumadin 7.5mg  PO tonight 3) Heparin level, INR and CBC in am 4) Follow for remainder of hypercoagulable workup results 5) Continue Unasyn 3gm IV q6h 6) coumadin book and video   Juliette Alcide, PharmD, BCPS.  Pager: [phone] 11/13/2011, 9:42 AM

## 2011-11-13 NOTE — Progress Notes (Addendum)
Name: Kyle Patel MRN: [id number] DOB: 06/04/1945    LOS: 3  Referring Provider:  Adriana Simas MD EDP Reason for Referral:  Pulmonary embolism  PULMONARY / CRITICAL CARE MEDICINE  HPI:  This is a 66 y/o male with a past medical history of HTN who presented to the [facility] ED on 6/17 with several days of shortness of breath and was found to have a large pulmonary embolism.  He states that for 5 to 6 days prior to admission he had noted near constant dyspnea and a pain in his R chest that was worse with lying flat.  He denies cough, fevers, chills.  He went to urgent care on 6/17 and was referred to the ED for further management.  He reports no preceding weight loss, unexplained pain or masses, surgeries, long travel, etc.  His age appropriate screening for colon and prostate cancer are up to date by his PCP (per patient).  He has noted several days of R leg pain.  He recently retired as a Curator and remains active daily.  Events Since Admission: 6/17 CT Angio Chest >> saddle pulmonary embolism, no pulmonary mass or lymphadenopathy  Current Status: Pain better, requires 4-5 L [place], B stable overnight  Vital Signs: Temp:  [98.9 F (37.2 C)-101 F (38.3 C)] 99 F (37.2 C) (06/20 0820) Pulse Rate:  [88-115] 95  (06/20 0820) Resp:  [22-40] 25  (06/20 0820) BP: (112-145)/(36-109) 129/63 mmHg (06/20 1141) SpO2:  [88 %-96 %] 96 % (06/20 0820) Weight:  [85.8 kg (189 lb 2.5 oz)] 85.8 kg (189 lb 2.5 oz) (06/20 0436)  Physical Examination: Gen: resting comfortably, no acute distress HEENT: NCAT, PERRL, EOMi, OP clear, neck supple without masses PULM: CTA B, speaking in complete sentences CV: RRR, no mgr, no JVD AB: BS+, soft, nontender, no hsm Ext: warm, no edema, no clubbing, no cyanosis Derm: no rash or skin breakdown Neuro: A&Ox4, CN II-XII intact, strength 5/5 in all 4 extremities  Active Problems:  Pulmonary embolism  Hypoxemia  Hypertension  CKD (chronic kidney disease)  ASSESSMENT AND  PLAN  PULMONARY  6/17 CT Angio Chest>>>Saddle pulmonary embolism, no pulmonary mass or lymphadenopathy 6/18 duplex>>Positive for DVT in the left femoral, popliteal, and posterior tibial veins.  Echo 6/18 >> RV mildly dilated with PAP of 76 mmHg.  ETT:  N/A  A:  Acute large pulmonary embolism.  No hemodynamic compromise.   No obvious risk factor  P:   - Heparin protocol, will transition to coumadin. - F/u hypercoagulable work up negative but will need additional work up once off anticoagulation but all negative as of now. - Should his condition change there is currently no contraindication to thrombolysis. - CT head neg. - CT abd/pelvis with PO contrast to screen for occult malignancy negative for mass or lymphadenopathy.  - Pulmonary HTN is out of proportion to PE and chronic HTN, auto-immune work up with borderline RF, elevated CRP and ESR but ANA is negative, ACE level pending, will need outpatient f/u. - IS per RT protocol. - Titrate down O2.  CARDIOVASCULAR  Lab 11/12/11 1738 11/11/11 1302 11/11/11 0430 11/10/11 2053 11/10/11 2052 11/10/11 1512  TROPONINI -- <0.30 <0.30 -- <0.30 <0.30  LATICACIDVEN 1.3 -- -- 1.0 -- --  PROBNP -- -- -- -- 603.7* --   ECG: NSR with RV strain pattern. Lines: Peripheral.  A: Acute pulmonary embolism without hemodynamic compromise; baseline hypertension.  PAP is 76 indicating likely chronic process rather than acute issue. P:  - Hold  in SDU for one more day due to high RR. - Restart home dilt but hold off clonidine since BP appears ok for now.  RENAL  Lab 11/13/11 0420 11/12/11 0420 11/11/11 0430 11/10/11 2024 11/10/11 1512  NA 136 133* 137 138 137  K 4.3 4.6 -- -- --  CL 103 101 104 103 104  CO2 21 20 19 19 20   BUN 23 21 19 19 20   CREATININE 1.68* 1.61* 1.43* 1.50* 1.49*  CALCIUM 9.1 9.3 9.2 9.2 9.9  MG 2.0 -- 1.9 1.8 --  PHOS 3.5 -- 3.0 3.3 --   Intake/Output      06/19 0701 - 06/20 0700 06/20 0701 - 06/21 0700   P.O. 1320     I.V. (mL/kg) 480 (5.6) 60.5 (0.7)   IV Piggyback 275 100   Total Intake(mL/kg) 2075 (24.2) 160.5 (1.9)   Urine (mL/kg/hr) 450 (0.2)    Total Output 450    Net +1625 +160.5         Foley:    A:  CKD, CO2 improving. P:   -Renal dosing of meds -Monitor UOP -Repeat BMET in AM -Renal U/S with no hydro but evidence of intrarenal disease likely due to HTN.  HEMATOLOGIC  Lab 11/13/11 0420 11/12/11 0420 11/11/11 0430 11/10/11 2024 11/10/11 2005 11/10/11 1512  HGB 11.2* 12.1* 12.7* 13.0 -- 13.2  HCT 32.5* 35.4* 37.5* 38.1* -- 39.4  PLT 190 179 158 151 -- 157  INR -- -- -- 1.11 1.10 --  APTT -- -- -- 32 -- --   A:  Acute pulmonary embolism P:  -See discussion of hypercoagulability work up above -Lower ext dopplers with evidence of DVT but no indication for a filter for now.  ID: febrile on 6/19, cultures sent and levaquin started but changed to unasyn due to itch.  Will maintain on current abx until cultures are resulted, fever likely due to PE not infection.  BEST PRACTICE / DISPOSITION Level of Care:  SDU Primary Service:  PCCM Consultants:   Code Status:  Full Diet:  regular DVT Px:  Hep Gtt GI Px:  Not indicated Skin Integrity:  normal Social / Family:  Updated at bedside  Koren Bound, M.D. Pulmonary and Critical Care Medicine [facility] Pager: [phone]  11/13/2011, 11:50 AM

## 2011-11-14 LAB — BASIC METABOLIC PANEL
CO2: 23 mEq/L (ref 19–32)
Chloride: 104 mEq/L (ref 96–112)
Creatinine, Ser: 1.53 mg/dL — ABNORMAL HIGH (ref 0.50–1.35)
GFR calc Af Amer: 54 mL/min — ABNORMAL LOW (ref >90)
Sodium: 136 mEq/L (ref 135–145)

## 2011-11-14 LAB — PROTIME-INR
INR: 1.13 (ref 0.00–1.49)
Prothrombin Time: 14.7 seconds (ref 11.6–15.2)

## 2011-11-14 LAB — MAGNESIUM: Magnesium: 2 mg/dL (ref 1.5–2.5)

## 2011-11-14 LAB — CBC
Hemoglobin: 10.9 g/dL — ABNORMAL LOW (ref 13.0–17.0)
MCH: 30.6 pg (ref 26.0–34.0)
Platelets: 216 10*3/uL (ref 150–400)
RBC: 3.56 MIL/uL — ABNORMAL LOW (ref 4.22–5.81)
WBC: 11.5 10*3/uL — ABNORMAL HIGH (ref 4.0–10.5)

## 2011-11-14 LAB — PHOSPHORUS: Phosphorus: 2.9 mg/dL (ref 2.3–4.6)

## 2011-11-14 LAB — HEPARIN LEVEL (UNFRACTIONATED): Heparin Unfractionated: 0.21 IU/mL — ABNORMAL LOW (ref 0.30–0.70)

## 2011-11-14 MED ORDER — WARFARIN SODIUM 7.5 MG PO TABS
7.5000 mg | ORAL_TABLET | Freq: Once | ORAL | Status: AC
  Administered 2011-11-14: 7.5 mg via ORAL
  Filled 2011-11-14: qty 1

## 2011-11-14 NOTE — Evaluation (Signed)
Physical Therapy Evaluation Patient Details Name: Kyle Patel MRN: [id number] DOB: [birth year] Today's Date: 11/14/2011 Time: 4401-0272 PT Time Calculation (min): 28 min  PT Assessment / Plan / Recommendation Clinical Impression  Patient s/p PE with decr mobility secondary to bedrest after PE.  Will benefit from PT to incr mobility and endurance.  Should not need PT f/u at d/c as patient should progress well.     PT Assessment  Patient needs continued PT services    Follow Up Recommendations  No PT follow up    Barriers to Discharge  None      lEquipment Recommendations  None recommended by PT    Recommendations for Other Services   None  Frequency Min 3X/week    Precautions / Restrictions Precautions Precautions: Fall   Pertinent Vitals/Pain VSS/No pain      Mobility  Bed Mobility Bed Mobility: Rolling Right;Right Sidelying to Sit;Sitting - Scoot to Edge of Bed Rolling Right: 4: Min guard Right Sidelying to Sit: 4: Min guard Sitting - Scoot to [place] of Bed: 4: Min guard Details for Bed Mobility Assistance: Took incr time and cues for technique Transfers Transfers: Sit to Stand;Stand to Sit Sit to Stand: 4: Min guard;With upper extremity assist;From bed Stand to Sit: 4: Min guard;With upper extremity assist;To chair/3-in-1 Details for Transfer Assistance: cues for hand placement Ambulation/Gait Ambulation/Gait Assistance: 4: Min guard Ambulation Distance (Feet): 50 Feet Assistive device: None Ambulation/Gait Assistance Details: Ambulated with 6 L O2.  DOE 3/4.  Pulse ox would not register.  Therefore, limited ambulation due to DOE. Patient with steady gait overall but did not challenge patient. Gait Pattern: Within Functional Limits Stairs: No Wheelchair Mobility Wheelchair Mobility: No         PT Diagnosis: Generalized weakness  PT Problem List: Decreased mobility PT Treatment Interventions: Gait training;DME instruction;Functional mobility  training;Therapeutic activities;Therapeutic exercise;Balance training;Patient/family education;Stair training   PT Goals Acute Rehab PT Goals PT Goal Formulation: With patient Time For Goal Achievement: 11/21/11 Potential to Achieve Goals: Good Pt will go Supine/Side to Sit: Independently PT Goal: Supine/Side to Sit - Progress: Goal set today Pt will Sit at Edge of Bed: Independently;6-10 min;with no upper extremity support PT Goal: Sit at Edge Of Bed - Progress: Goal set today Pt will go Sit to Stand: Independently PT Goal: Sit to Stand - Progress: Goal set today Pt will Ambulate: 51 - 150 feet;with modified independence;with least restrictive assistive device PT Goal: Ambulate - Progress: Goal set today Pt will Go Up / Down Stairs: 1-2 stairs;with modified independence;with least restrictive assistive device PT Goal: Up/Down Stairs - Progress: Goal set today  Visit Information  Last PT Received On: 11/14/11 Assistance Needed: +1    Subjective Data  Subjective: "I will do what I can." Patient Stated Goal: To do what I did before   Prior Functioning  Home Living Lives With: Spouse Available Help at Discharge: Family Type of Home: House Home Access: Stairs to enter [employer] of Steps: 2 Entrance Stairs-Rails: None Home Layout: Two level;Able to live on main level with bedroom/bathroom Alternate Level Stairs-Number of Steps: 15 Alternate Level Stairs-Rails: Right (wall on left) Bathroom Shower/Tub: Tub/shower unit;Curtain Bathroom Toilet: Standard Home Adaptive Equipment: None Prior Function Level of Independence: Independent Able to Take Stairs?: Yes Driving: Yes Vocation: Part time employment Comments: drives truck (retired) Musician: No difficulties Dominant Hand: Right    Cognition  Overall Cognitive Status: Appears within functional limits for tasks assessed/performed Arousal/Alertness: Awake/alert Orientation Level: Appears  intact for tasks assessed Behavior During Session: [facility] for tasks performed    Extremity/Trunk Assessment Right Upper Extremity Assessment RUE ROM/Strength/Tone: Within functional levels RUE Sensation: WFL - Light Touch RUE Coordination: WFL - gross/fine motor Left Upper Extremity Assessment LUE ROM/Strength/Tone: Within functional levels LUE Sensation: WFL - Light Touch LUE Coordination: WFL - gross/fine motor Right Lower Extremity Assessment RLE ROM/Strength/Tone: Within functional levels RLE Sensation: WFL - Light Touch RLE Coordination: WFL - gross/fine motor Left Lower Extremity Assessment LLE ROM/Strength/Tone: Within functional levels LLE Sensation: WFL - Light Touch LLE Coordination: WFL - gross/fine motor Trunk Assessment Trunk Assessment: Normal   Balance Balance Balance Assessed: Yes  End of Session PT - End of Session Equipment Utilized During Treatment: Gait belt Activity Tolerance: Patient tolerated treatment well Patient left: in chair;with call bell/phone within reach;with family/visitor present Nurse Communication: Mobility status   INGOLD,Emanie Behan 11/14/2011, 1:12 PM  [facility] [phone] (pager)

## 2011-11-14 NOTE — Progress Notes (Addendum)
ANTICOAGULATION CONSULT NOTE - Follow Up Consult  Pharmacy Consult for heparin/warfarin Indication: DVT/PE  Allergies  Allergen Reactions  . Levaquin (Levofloxacin) Itching    Patient Measurements: Height: 5\' 9"  (175.3 cm) Weight: 190 lb 0.6 oz (86.2 kg) IBW/kg (Calculated) : 70.7  Heparin Dosing Weight: 80 kg  Vital Signs: Temp: 98.1 F (36.7 C) (06/21 1242) Temp src: Oral (06/21 1242) BP: 132/64 mmHg (06/21 1242) Pulse Rate: 88  (06/21 1242)  Labs:  Basename 11/14/11 1300 11/14/11 0355 11/13/11 0420 11/12/11 0420  HGB -- 10.9* 11.2* --  HCT -- 32.0* 32.5* 35.4*  PLT -- 216 190 179  APTT -- -- -- --  LABPROT 14.7 -- -- --  INR 1.13 -- -- --  HEPARINUNFRC 0.43 0.21* 0.35 --  CREATININE -- 1.53* 1.68* 1.61*  CKTOTAL -- -- -- --  CKMB -- -- -- --  TROPONINI -- -- -- --    Estimated Creatinine Clearance: 53.1 ml/min (by C-G formula based on Cr of 1.53).   Medications:  Scheduled:     . ampicillin-sulbactam (UNASYN) IV  3 g Intravenous Q6H  . diltiazem  30 mg Oral Q6H  . patient's guide to using coumadin book   Does not apply Once  . warfarin  7.5 mg Oral ONCE-1800  . warfarin   Does not apply Once  . Warfarin - Pharmacist Dosing Inpatient   Does not apply q1800   Infusions:     . sodium chloride 10 mL/hr at 11/13/11 2043  . heparin 1,150 Units/hr (11/14/11 1314)    Assessment: PE/DVT: Currently therapeutic on Heparin -lower end of range.  Orders to start coumadin tonight, so today will be D2/5 for VTE overlap. Baseline INR = 1.11, INR today is 1.13 following 1st dose of coumadin last pm (7.5mg ). Heparin level low this am, rate increased to 1150 units/hr and hepar level is therapeutic at 0.43. Hypercoagulable workup is unrevealing thus far. Hgb -stable, platelets improving and WNL  ID: on empiric abx for leukocytosis and fevers.  levaquin changed to unasyn 6/19 PM d/t severe itching thought to be related to FQ.   Levaquin 6/19 >>6/19 Unasyn 6/19  >>  6/19 Blood x 2: NGTD 6/19 Urine: 8K -insignificant growth  Goal of Therapy:  Heparin level 0.3-0.7 units/ml Monitor platelets by anticoagulation protocol: Yes   Plan:  1) Continue with heparin at current rate of 1150 units/hr 2) repeat Coumadin 7.5mg  PO tonight, if no movement in INR 6/22, consider increasing dose 3) Heparin level, INR and CBC in am 4) Continue Unasyn 3gm IV q6h - f/u d/c as cultures are unrevealing   Juliette Alcide, PharmD, BCPS.  Pager: [phone] 11/14/2011, 1:59 PM

## 2011-11-14 NOTE — Progress Notes (Signed)
Name: Kyle Patel MRN: [id number] DOB: 03-13-1946    LOS: 4  Referring Provider:  Adriana Simas MD EDP Reason for Referral:  Pulmonary embolism  PULMONARY / CRITICAL CARE MEDICINE  HPI:  This is a 66 y/o male with a past medical history of HTN who presented to the [facility] ED on 6/17 with several days of shortness of breath and was found to have a large pulmonary embolism.  He states that for 5 to 6 days prior to admission he had noted near constant dyspnea and a pain in his R chest that was worse with lying flat.  He denies cough, fevers, chills.  He went to urgent care on 6/17 and was referred to the ED for further management.  He reports no preceding weight loss, unexplained pain or masses, surgeries, long travel, etc.  His age appropriate screening for colon and prostate cancer are up to date by his PCP (per patient).  He has noted several days of R leg pain.  He recently retired as a Curator and remains active daily.  Events Since Admission: 6/17 CT Angio Chest >> saddle pulmonary embolism, no pulmonary mass or lymphadenopathy  Current Status: Pain better, requires 4-5 L Pierson, B stable overnight  Vital Signs: Temp:  [98.7 F (37.1 C)-100.2 F (37.9 C)] 98.9 F (37.2 C) (06/21 0818) Pulse Rate:  [87-110] 87  (06/21 1034) Resp:  [16-38] 20  (06/21 0818) BP: (117-161)/(58-128) 131/65 mmHg (06/21 1034) SpO2:  [91 %-97 %] 95 % (06/21 1034) Weight:  [86.2 kg (190 lb 0.6 oz)] 86.2 kg (190 lb 0.6 oz) (06/21 0004)  Physical Examination: Gen: resting comfortably, no acute distress HEENT: NCAT, PERRL, EOMi, OP clear, neck supple without masses PULM: CTA B, speaking in complete sentences CV: RRR, no mgr, no JVD AB: BS+, soft, nontender, no hsm Ext: warm, no edema, no clubbing, no cyanosis Derm: no rash or skin breakdown Neuro: A&Ox4, CN II-XII intact, strength 5/5 in all 4 extremities  Active Problems:  Pulmonary embolism  Hypoxemia  Hypertension  CKD (chronic kidney disease)  ASSESSMENT AND  PLAN  PULMONARY  6/17 CT Angio Chest>>>Saddle pulmonary embolism, no pulmonary mass or lymphadenopathy 6/18 duplex>>Positive for DVT in the left femoral, popliteal, and posterior tibial veins.  Echo 6/18 >> RV mildly dilated with PAP of 76 mmHg.  ETT:  N/A  A:  Acute large pulmonary embolism.  No hemodynamic compromise.   No obvious risk factor  P:   - Heparin protocol, will transition to coumadin. - F/u hypercoagulable work up negative but will need additional work up once off anticoagulation but all negative as of now. - Should his condition change there is currently no contraindication to thrombolysis. - CT head neg. - CT abd/pelvis with PO contrast to screen for occult malignancy negative for mass or lymphadenopathy.  - Pulmonary HTN is out of proportion to PE and chronic HTN, auto-immune work up with borderline RF, elevated CRP and ESR but ANA is negative, ACE level pending, will need outpatient f/u. - IS per RT protocol. - Titrate down O2.  CARDIOVASCULAR  Lab 11/12/11 1738 11/11/11 1302 11/11/11 0430 11/10/11 2053 11/10/11 2052 11/10/11 1512  TROPONINI -- <0.30 <0.30 -- <0.30 <0.30  LATICACIDVEN 1.3 -- -- 1.0 -- --  PROBNP -- -- -- -- 603.7* --   ECG: NSR with RV strain pattern. Lines: Peripheral.  A: Acute pulmonary embolism without hemodynamic compromise; baseline hypertension.  PAP is 76 indicating likely chronic process rather than acute issue. P:  - Hold  in SDU for one more day due to high RR and yet to mobilize. - Continue home dilt but hold off clonidine since BP appears ok for now.  RENAL  Lab 11/14/11 0355 11/13/11 0420 11/12/11 0420 11/11/11 0430 11/10/11 2024  NA 136 136 133* 137 138  K 4.4 4.3 -- -- --  CL 104 103 101 104 103  CO2 23 21 20 19 19   BUN 22 23 21 19 19   CREATININE 1.53* 1.68* 1.61* 1.43* 1.50*  CALCIUM 9.1 9.1 9.3 9.2 9.2  MG 2.0 2.0 -- 1.9 1.8  PHOS 2.9 3.5 -- 3.0 3.3   Intake/Output      06/20 0701 - 06/21 0700 06/21 0701 - 06/22  0700   P.O. 1060 240   I.V. (mL/kg) 532 (6.2) 21.5 (0.2)   IV Piggyback 400 100   Total Intake(mL/kg) 1992 (23.1) 361.5 (4.2)   Urine (mL/kg/hr) 1650 (0.8)    Total Output 1650    Net +342 +361.5        Urine Occurrence 1 x     Foley:    A:  CKD, CO2 improving. P:   -Renal dosing of meds. -Monitor UOP. -Repeat BMET in AM. -Renal U/S with no hydro but evidence of intrarenal disease likely due to HTN.  HEMATOLOGIC  Lab 11/14/11 0355 11/13/11 0420 11/12/11 0420 11/11/11 0430 11/10/11 2024 11/10/11 2005  HGB 10.9* 11.2* 12.1* 12.7* 13.0 --  HCT 32.0* 32.5* 35.4* 37.5* 38.1* --  PLT 216 190 179 158 151 --  INR -- -- -- -- 1.11 1.10  APTT -- -- -- -- 32 --   A:  Acute pulmonary embolism P:  - See discussion of hypercoagulability work up above - Lower ext dopplers with evidence of DVT but no indication for a filter for now.  ID: febrile on 6/19, cultures sent and levaquin started but changed to unasyn due to itch.  Will maintain on current abx until cultures are resulted, fever likely due to PE not infection.  Cultures not finalized, if remains negative would d/c bx.  BEST PRACTICE / DISPOSITION Level of Care:  SDU Primary Service:  PCCM Consultants:   Code Status:  Full Diet:  regular DVT Px:  Hep Gtt GI Px:  Not indicated Skin Integrity:  normal Social / Family:  Updated at bedside  Koren Bound, M.D. Pulmonary and Critical Care Medicine [facility] Pager: [phone]  11/14/2011, 12:02 PM

## 2011-11-14 NOTE — Progress Notes (Signed)
ANTICOAGULATION CONSULT NOTE - Follow Up Consult  Pharmacy Consult for heparin Indication: PE/DVT  Labs:  Basename 11/14/11 0355 11/13/11 0420 11/12/11 0420 11/11/11 1302  HGB 10.9* 11.2* -- --  HCT 32.0* 32.5* 35.4* --  PLT 216 190 179 --  APTT -- -- -- --  LABPROT -- -- -- --  INR -- -- -- --  HEPARINUNFRC 0.21* 0.35 0.59 --  CREATININE -- 1.68* 1.61* --  CKTOTAL -- -- -- 123  CKMB -- -- -- 1.8  TROPONINI -- -- -- <0.30    Assessment: 66yo male now subtherapeutic on heparin after stable with levels at goal though trending down, no IVline issues per RN.  Goal of Therapy:  Heparin level 0.3-0.7 units/ml   Plan:  Will increase heparin gtt by ~1-2 units/kg/hr to 1150 units/hr and check level in 6-8hr.  Colleen Can PharmD BCPS 11/14/2011,5:18 AM

## 2011-11-15 LAB — HEPARIN LEVEL (UNFRACTIONATED): Heparin Unfractionated: 0.41 IU/mL (ref 0.30–0.70)

## 2011-11-15 LAB — BASIC METABOLIC PANEL
BUN: 21 mg/dL (ref 6–23)
Chloride: 105 mEq/L (ref 96–112)
Creatinine, Ser: 1.32 mg/dL (ref 0.50–1.35)
Glucose, Bld: 127 mg/dL — ABNORMAL HIGH (ref 70–99)
Potassium: 4.2 mEq/L (ref 3.5–5.1)

## 2011-11-15 LAB — CBC
HCT: 30.3 % — ABNORMAL LOW (ref 39.0–52.0)
Hemoglobin: 10.3 g/dL — ABNORMAL LOW (ref 13.0–17.0)
MCH: 29.9 pg (ref 26.0–34.0)
MCHC: 34 g/dL (ref 30.0–36.0)
RDW: 13.7 % (ref 11.5–15.5)

## 2011-11-15 MED ORDER — WARFARIN SODIUM 10 MG PO TABS
10.0000 mg | ORAL_TABLET | Freq: Once | ORAL | Status: AC
  Administered 2011-11-15: 10 mg via ORAL
  Filled 2011-11-15 (×2): qty 1

## 2011-11-15 NOTE — Progress Notes (Signed)
Name: Kyle Patel MRN: [id number] DOB: 1945/07/21    LOS: 5  Referring Provider:  Adriana Simas MD EDP Reason for Referral:  Pulmonary embolism  PULMONARY / CRITICAL CARE MEDICINE Brief patient profile:  22 yobm with  HTN who presented to the [facility] ED on 6/17 with several days of shortness of breath and was found to have a large pulmonary embolism.  He states that for 5 to 6 days prior to admission he had noted near constant dyspnea and a pain in his R chest that was worse with lying flat > to urgent care on 6/17 and was referred to the ED for further management and found to saddle pe and L dvt        Current Status: Still on up to 6lpm but nad, able to get up in chair   Vital Signs: Temp:  [97.7 F (36.5 C)-99.1 F (37.3 C)] 97.7 F (36.5 C) (06/22 0811) Pulse Rate:  [71-99] 71  (06/22 0811) Resp:  [16-31] 16  (06/22 0811) BP: (119-145)/(61-80) 119/63 mmHg (06/22 0811) SpO2:  [91 %-97 %] 95 % (06/22 0811) Weight:  [191 lb 5.8 oz (86.8 kg)-193 lb 9 oz (87.8 kg)] 191 lb 5.8 oz (86.8 kg) (06/22 0500) On 6lpm   Physical Examination: Gen: resting comfortably, no acute distress HEENT: NCAT, PERRL, EOMi, OP clear, neck supple without masses PULM: CTA B, speaking in complete sentences CV: RRR, no mgr, no JVD AB: BS+, soft, nontender, no hsm Ext: warm, no edema, no clubbing, no cyanosis Derm: no rash or skin breakdown Neuro: A&Ox4, CN II-XII intact, strength 5/5 in all 4 extremities  Active Problems:  Pulmonary embolism  Hypoxemia  Hypertension  CKD (chronic kidney disease)  ASSESSMENT AND PLAN  PULMONARY  6/17 CT Angio Chest>>>Saddle pulmonary embolism, no pulmonary mass or lymphadenopathy 6/18 duplex>>Positive for DVT in the left femoral, popliteal, and posterior tibial veins.  Echo 6/18 >> RV mildly dilated with PAP of 76 mmHg.     A:  Acute large pulmonary embolism.  No hemodynamic compromise.   No obvious risk factors  P:   - Heparin protocol, will transition to  coumadin with 48 h overlap inr - F/u hypercoagulable work up negative but will need additional work up once off anticoagulation but all negative as of now. - Should his condition change there is currently no contraindication to thrombolysis. - CT head neg. - CT abd/pelvis with PO contrast >  negative for mass or lymphadenopathy.  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 - IS per RT protocol. - Titrate down O2 as tol  CARDIOVASCULAR  Lab 11/12/11 1738 11/11/11 1302 11/11/11 0430 11/10/11 2053 11/10/11 2052 11/10/11 1512  TROPONINI -- <0.30 <0.30 -- <0.30 <0.30  LATICACIDVEN 1.3 -- -- 1.0 -- --  PROBNP -- -- -- -- 603.7* --   ECG: NSR with RV strain pattern. Lines: Peripheral.  A: Acute pulmonary embolism without hemodynamic compromise; baseline hypertension.  PAP is 76 indicating likely chronic process rather than acute issue. P:  - Continue home dilt but hold off clonidine since BP appears ok for now.  RENAL  Lab 11/15/11 0440 11/14/11 0355 11/13/11 0420 11/12/11 0420 11/11/11 0430 11/10/11 2024  NA 138 136 136 133* 137 --  K 4.2 4.4 -- -- -- --  CL 105 104 103 101 104 --  CO2 21 23 21  20  19 --  BUN 21 22 23 21 19  --  CREATININE 1.32 1.53* 1.68* 1.61* 1.43* --  CALCIUM 8.9 9.1 9.1 9.3 9.2 --  MG 2.1 2.0 2.0 -- 1.9 1.8  PHOS 2.9 2.9 3.5 -- 3.0 3.3   Intake/Output      06/21 0701 - 06/22 0700 06/22 0701 - 06/23 0700   P.O. 480    I.V. (mL/kg) 663 (7.6) 31.5 (0.4)   IV Piggyback 400 100   Total Intake(mL/kg) 1543 (17.8) 131.5 (1.5)   Urine (mL/kg/hr) 1350 (0.6)    Total Output 1350    Net +193 +131.5        Urine Occurrence 1 x    Stool Occurrence 1 x       Renal U/S  6/19 > no hydro but evidence of intrarenal disease likely due to HTN.   A:  CKD, CO2 improving. P:   -Renal dosing of meds. -Monitor UOP.   - HEMATOLOGIC  Lab  11/15/11 0440 11/14/11 1300 11/14/11 0355 11/13/11 0420 11/12/11 0420 11/11/11 0430 11/10/11 2024 11/10/11 2005  HGB 10.3* -- 10.9* 11.2* 12.1* 12.7* -- --  HCT 30.3* -- 32.0* 32.5* 35.4* 37.5* -- --  PLT 245 -- 216 190 179 158 -- --  INR 1.12 1.13 -- -- -- -- 1.11 1.10  APTT -- -- -- -- -- -- 32 --   A:  Acute pulmonary embolism P:  - See discussion of hypercoagulability work up above    ID: febrile on 6/19, cultures sent and levaquin started but changed to unasyn due to itch.    fever likely due to PE not infection.  - Dc abx 6/22   BEST PRACTICE / DISPOSITION Level of Care:  Transfer to floor 6/22 Primary Service:  PCCM Consultants:   Code Status:  Full Diet:  regular DVT Px:  Hep Gtt GI Px:  Not indicated Skin Integrity:  normal Social / Family:  None   Sandrea Hughs, M.D. Pulmonary and Critical Care Medicine [facility] Pager: [phone]  11/15/2011, 11:04 AM

## 2011-11-15 NOTE — Progress Notes (Signed)
Physical Therapy Treatment Patient Details Name: Kyle Patel MRN: [id number] DOB: January 28, 1946 Today's Date: 11/15/2011 Time: [id number] PT Time Calculation (min): 24 min  PT Assessment / Plan / Recommendation Comments on Treatment Session  Maintained sats greater than 90% during gait but on 6 liters. Again increased DOE with gait and needing cues for rest breaks. Pt appears to be very independently minded and might not quite understanding pacing himself to return to his PLF/activity level. Educated pt and wife about steady progress and when to rest. Encouraged practicing his breathing exercises (pursed lip breathing). Encouraged pt to ambulate with staff 2-3x/day and pt very excited about this.  This pt had a bowel movement at the end of our session today and when finished the patient was very out of breath reporting that he strained a lot. Educated him on concerns for increased risk of bleeding when on coumadin/other blood thinners.     Follow Up Recommendations  No PT follow up    Barriers to Discharge        Equipment Recommendations  None recommended by PT    Recommendations for Other Services    Frequency Min 3X/week   Plan Discharge plan remains appropriate;Frequency remains appropriate    Precautions / Restrictions Precautions Precautions: Fall       Mobility  Transfers Sit to Stand: 5: Supervision;With upper extremity assist;From chair/3-in-1 Stand to Sit: 5: Supervision;With upper extremity assist;With armrests;To chair/3-in-1 Details for Transfer Assistance: sit<>stand x3 (one chair had arm rests the other did not and then again from low toilet using grab bars to assist), slow to rise and needed time to stabilize upon standing Ambulation/Gait Ambulation/Gait Assistance: 4: Min guard;5: Supervision Ambulation Distance (Feet): 300 Feet Assistive device: None Ambulation/Gait Assistance Details: slow steady pace, appears a bit reserved/cautious in nature; needing cues for  standing and seated rest breaks as pt with increased DOE. Again did not challenge the patient but he was able to appropriately scan the environment during ambulation without any difficulties; more focus placed on self monitoring for breat control and when to rest, needed a cue to sit as pt trying to push through with evident increased DOE Gait Pattern: Within Functional Limits    Exercises General Exercises - Lower Extremity Long Arc Quad: AROM;Both;10 reps;Seated     PT Goals Acute Rehab PT Goals PT Goal: Sit to Stand - Progress: Progressing toward goal PT Goal: Ambulate - Progress: Progressing toward goal  Visit Information  Last PT Received On: 11/15/11 Assistance Needed: +1    Subjective Data  Subjective: Sure, lets go.    Cognition  Overall Cognitive Status: Appears within functional limits for tasks assessed/performed Arousal/Alertness: Awake/alert Orientation Level: Appears intact for tasks assessed Behavior During Session: [facility] for tasks performed    Balance  High Level Balance High Level Balance Activites: Backward walking;Side stepping High Level Balance Comments: pt able to maneuver obstacles in the room well, slow and cautious pace but not overt difficulties or LOB noted  End of Session PT - End of Session Equipment Utilized During Treatment: Gait belt Activity Tolerance: Patient tolerated treatment well Patient left: in chair;with call bell/phone within reach;with family/visitor present    [facility] HELEN 11/15/2011, 3:30 PM

## 2011-11-15 NOTE — Progress Notes (Signed)
Report called to jeanette on [id number] pt transferred to [id number] with belongings and meds and pt has no c/os at this time.[facility] [employer]

## 2011-11-15 NOTE — Progress Notes (Signed)
Up in chair, assessment unchanged.  Call light in reach.  See assessment flow sheet.

## 2011-11-15 NOTE — Progress Notes (Signed)
ANTICOAGULATION CONSULT NOTE - Follow Up Consult  Pharmacy Consult for heparin/warfarin Indication: DVT/PE  Allergies  Allergen Reactions  . Levaquin (Levofloxacin) Itching    Patient Measurements: Height: 5\' 9"  (175.3 cm) Weight: 191 lb 5.8 oz (86.8 kg) IBW/kg (Calculated) : 70.7  Heparin Dosing Weight: 80 kg  Vital Signs: Temp: 98.6 F (37 C) (06/22 0422) Temp src: Oral (06/22 0422) BP: 135/63 mmHg (06/22 0500) Pulse Rate: 84  (06/22 0500)  Labs:  Basename 11/15/11 0440 11/14/11 1300 11/14/11 0355 11/13/11 0420  HGB 10.3* -- 10.9* --  HCT 30.3* -- 32.0* 32.5*  PLT 245 -- 216 190  APTT -- -- -- --  LABPROT 14.6 14.7 -- --  INR 1.12 1.13 -- --  HEPARINUNFRC 0.41 0.43 0.21* --  CREATININE 1.32 -- 1.53* 1.68*  CKTOTAL -- -- -- --  CKMB -- -- -- --  TROPONINI -- -- -- --    Estimated Creatinine Clearance: 61.7 ml/min (by C-G formula based on Cr of 1.32).   Medications:  Scheduled:     . ampicillin-sulbactam (UNASYN) IV  3 g Intravenous Q6H  . diltiazem  30 mg Oral Q6H  . warfarin  7.5 mg Oral ONCE-1800  . Warfarin - Pharmacist Dosing Inpatient   Does not apply q1800   Infusions:     . sodium chloride 10 mL/hr at 11/13/11 2043  . heparin 1,150 Units/hr (11/14/11 1314)    Assessment: PE/DVT: D#3 Heparin/Coumadin bridge for saddle PE/LLE DVT; HL therapeutic. INR = 1.12. No overt bleeding. Hgb slowly trending down. Plts ok. Hypercoag workup all negative.  No bleeding noted. Will increase coumadin dose as no increase in INR yet.    Goal of Therapy:  Heparin level 0.3-0.7 units/ml Monitor platelets by anticoagulation protocol: Yes   Plan:  1) Continue heparin at current rate of 1150 units/hr 2) Coumadin 10mg  po x 1  3) Heparin level, INR and CBC in am  Haynes Hoehn, PharmD 11/15/2011 7:57 AM  Pager: [phone]

## 2011-11-16 LAB — CBC
Hemoglobin: 10 g/dL — ABNORMAL LOW (ref 13.0–17.0)
MCH: 30.2 pg (ref 26.0–34.0)
MCHC: 33.7 g/dL (ref 30.0–36.0)
MCV: 89.7 fL (ref 78.0–100.0)

## 2011-11-16 LAB — GLUCOSE, CAPILLARY

## 2011-11-16 LAB — HEPARIN LEVEL (UNFRACTIONATED): Heparin Unfractionated: 0.4 IU/mL (ref 0.30–0.70)

## 2011-11-16 MED ORDER — WARFARIN SODIUM 10 MG PO TABS
10.0000 mg | ORAL_TABLET | Freq: Once | ORAL | Status: AC
  Administered 2011-11-16: 10 mg via ORAL
  Filled 2011-11-16: qty 1

## 2011-11-16 NOTE — Plan of Care (Signed)
Problem: Consults Goal: Diagnosis - Venous Thromboembolism (VTE) Choose a selection PE (Pulmonary Embolism)     

## 2011-11-16 NOTE — Progress Notes (Signed)
Name: Kyle Patel MRN: [id number] DOB: [birth year]    LOS: 6  Referring Provider:  Adriana Simas MD EDP Reason for Referral:  Pulmonary embolism  PULMONARY / CRITICAL CARE MEDICINE Brief patient profile:  52 yobm with  HTN who presented to the [facility] ED on 6/17 with several days of shortness of breath and was found to have a large pulmonary embolism.  He states that for 5 to 6 days prior to admission he had noted near constant dyspnea and a pain in his R chest that was worse with lying flat > to urgent care on 6/17 and was referred to the ED for further management and found to saddle pe and L dvt        Current Status: Still on up to 6lpm but nad, able to walk to end of hallway on 6700 on portable 02   Vital Signs: Temp:  [97.6 F (36.4 C)-99 F (37.2 C)] 97.6 F (36.4 C) (06/23 1007) Pulse Rate:  [78-97] 79  (06/23 1007) Resp:  [18-20] 19  (06/23 1007) BP: (117-144)/(55-75) 117/71 mmHg (06/23 1007) SpO2:  [88 %-99 %] 93 % (06/23 1007) Weight:  [192 lb 0.3 oz (87.1 kg)] 192 lb 0.3 oz (87.1 kg) (06/22 2121) On 6lpm   Physical Examination: Gen: resting comfortably, no acute distress HEENT: NCAT, PERRL, EOMi, OP clear, neck supple without masses PULM: CTA B, speaking in complete sentences CV: RRR, no mgr, no JVD/ slt increase P2 AB: BS+, soft, nontender, no hsm Ext: warm, no edema, no clubbing, no cyanosis Derm: no rash or skin breakdown Neuro: alert, approp, no defecits  Active Problems:  Pulmonary embolism  Hypoxemia  Hypertension  CKD (chronic kidney disease)  ASSESSMENT AND PLAN  PULMONARY  6/17 CT Angio Chest>>>Saddle pulmonary embolism, no pulmonary mass or lymphadenopathy 6/18 duplex>>Positive for DVT in the left femoral, popliteal, and posterior tibial veins.  Echo 6/18 >> RV mildly dilated with PAP of 76 mmHg.     A:  Acute large pulmonary embolism.  No hemodynamic compromise.   No obvious risk factors  P:   - Heparin protocol,   transitioning to coumadin with  48 h overlap inr - F/u hypercoagulable work up negative but will need additional work up once off anticoagulation but all negative as of now. - Should his condition change there is currently no contraindication to thrombolysis. - CT head neg. - CT abd/pelvis with PO contrast >  negative for mass or lymphadenopathy.  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 - IS per RT protocol. - Titrate down O2 as tol  CARDIOVASCULAR  Lab 11/12/11 1738 11/11/11 1302 11/11/11 0430 11/10/11 2053 11/10/11 2052 11/10/11 1512  TROPONINI -- <0.30 <0.30 -- <0.30 <0.30  LATICACIDVEN 1.3 -- -- 1.0 -- --  PROBNP -- -- -- -- 603.7* --   ECG: NSR with RV strain pattern. Lines: Peripheral.  A: Acute pulmonary embolism without hemodynamic compromise; baseline hypertension.  PAP is 76 indicating likely chronic process rather than acute issue. P:  - Continue home dilt but hold off clonidine since BP appears ok for now.  RENAL  Lab 11/15/11 0440 11/14/11 0355 11/13/11 0420 11/12/11 0420 11/11/11 0430 11/10/11 2024  NA 138 136 136 133* 137 --  K 4.2 4.4 -- -- -- --  CL 105 104 103 101 104 --  CO2 21 23 21 20 19  --  BUN 21 22 23 21 19  --  CREATININE 1.32 1.53* 1.68* 1.61* 1.43* --  CALCIUM 8.9 9.1 9.1 9.3 9.2 --  MG 2.1 2.0 2.0 -- 1.9 1.8  PHOS 2.9 2.9 3.5 -- 3.0 3.3   Intake/Output      06/22 0701 - 06/23 0700 06/23 0701 - 06/24 0700   P.O. 960 240   I.V. (mL/kg) 351 (4) 220 (2.5)   IV Piggyback 100    Total Intake(mL/kg) 1411 (16.2) 460 (5.3)   Urine (mL/kg/hr) 1350 (0.6)    Total Output 1350    Net +61 +460        Urine Occurrence 2 x       Renal U/S  6/19 > no hydro but evidence of intrarenal disease likely due to HTN.   A:  CKD, Creat improving. P:   -Renal dosing of meds.   HEMATOLOGIC  Lab 11/16/11 0536 11/15/11 0440 11/14/11 1300 11/14/11 0355 11/13/11  0420 11/12/11 0420 11/10/11 2024 11/10/11 2005  HGB 10.0* 10.3* -- 10.9* 11.2* 12.1* -- --  HCT 29.7* 30.3* -- 32.0* 32.5* 35.4* -- --  PLT 270 245 -- 216 190 179 -- --  INR 1.40 1.12 1.13 -- -- -- 1.11 1.10  APTT -- -- -- -- -- -- 32 --   A:  Acute pulmonary embolism P:  - See discussion of hypercoagulability work up above - need 48 hour overlap but INR not therapeutic as of 6/23    ID: febrile on 6/19, cultures sent and levaquin started but changed to unasyn due to itch.    fever likely due to PE not infection.  - Dc abx 6/22 - No signicant fever since 6/19   BEST PRACTICE / DISPOSITION Level of Care:  Transferred to floor 6/22 Primary Service:  PCCM Consultants:  None Code Status:  Full Diet:  regular DVT Px:  Hep Gtt GI Px:  Not indicated Skin Integrity:  normal Social / Family:  None   Sandrea Hughs, M.D. Pulmonary and Critical Care Medicine [facility] Pager: [phone]  11/16/2011, 12:40 PM

## 2011-11-16 NOTE — Progress Notes (Signed)
ANTICOAGULATION CONSULT NOTE - Follow Up Consult  Pharmacy Consult for heparin/warfarin Indication: DVT/PE  Allergies  Allergen Reactions  . Levaquin (Levofloxacin) Itching    Patient Measurements: Height: 5\' 9"  (175.3 cm) Weight: 192 lb 0.3 oz (87.1 kg) IBW/kg (Calculated) : 70.7  Heparin Dosing Weight: 80 kg  Vital Signs: Temp: 98 F (36.7 C) (06/23 0433) Temp src: Oral (06/23 0433) BP: 130/55 mmHg (06/23 0433) Pulse Rate: 78  (06/23 0433)  Labs:  Basename 11/16/11 0536 11/15/11 0440 11/14/11 1300 11/14/11 0355  HGB 10.0* 10.3* -- --  HCT 29.7* 30.3* -- 32.0*  PLT 270 245 -- 216  APTT -- -- -- --  LABPROT 17.4* 14.6 14.7 --  INR 1.40 1.12 1.13 --  HEPARINUNFRC 0.40 0.41 0.43 --  CREATININE -- 1.32 -- 1.53*  CKTOTAL -- -- -- --  CKMB -- -- -- --  TROPONINI -- -- -- --    Estimated Creatinine Clearance: 61.8 ml/min (by C-G formula based on Cr of 1.32).   Medications:  Scheduled:     . diltiazem  30 mg Oral Q6H  . warfarin  10 mg Oral ONCE-1800  . Warfarin - Pharmacist Dosing Inpatient   Does not apply q1800  . DISCONTD: ampicillin-sulbactam (UNASYN) IV  3 g Intravenous Q6H   Infusions:     . heparin 1,150 Units/hr (11/15/11 1025)  . DISCONTD: sodium chloride 10 mL/hr (11/15/11 1026)    Assessment: 66 yo male on D#4 Heparin/Coumadin bridge for saddle PE/LLE DVT; HL remains therapeutic at 0.40. INR = 1.40 is subtherapeutic but trending up; all doses ordered have been charted as given. Hgb slowly trending down. Plts ok (trend up). Hypercoag workup all negative.  No bleeding or line issues reported per RN. Will repeat 10 mg with slow INR trend.  Goal of Therapy:  Heparin level 0.3-0.7 units/ml Monitor platelets by anticoagulation protocol: Yes   Plan:  1) Continue heparin at current rate of 1150 units/hr 2) Coumadin 10mg  po x 1  3) Heparin level, INR and CBC in am   Maudry Mayhew, PharmD Pgr [phone] 11/16/2011 8:34 AM

## 2011-11-17 LAB — CBC
Hemoglobin: 10 g/dL — ABNORMAL LOW (ref 13.0–17.0)
MCH: 29.7 pg (ref 26.0–34.0)
Platelets: 295 10*3/uL (ref 150–400)
RBC: 3.37 MIL/uL — ABNORMAL LOW (ref 4.22–5.81)
WBC: 10 10*3/uL (ref 4.0–10.5)

## 2011-11-17 LAB — PROTIME-INR
INR: 1.88 — ABNORMAL HIGH (ref 0.00–1.49)
Prothrombin Time: 21.9 seconds — ABNORMAL HIGH (ref 11.6–15.2)

## 2011-11-17 LAB — HEPARIN LEVEL (UNFRACTIONATED): Heparin Unfractionated: 0.43 IU/mL (ref 0.30–0.70)

## 2011-11-17 MED ORDER — WARFARIN SODIUM 4 MG PO TABS
8.0000 mg | ORAL_TABLET | Freq: Once | ORAL | Status: AC
  Administered 2011-11-17: 8 mg via ORAL
  Filled 2011-11-17: qty 2

## 2011-11-17 NOTE — Progress Notes (Signed)
Physical Therapy Treatment Patient Details Name: Kyle Patel MRN: [id number] DOB: [birth year] Today's Date: 11/17/2011 Time: 4098-1191 PT Time Calculation (min): 19 min  PT Assessment / Plan / Recommendation Comments on Treatment Session  Pt is independent with all mobility.  O2 sats continue to drop on Room air.  Suggested pt ambulate daily with nursing.  No further acute PT needs.     Follow Up Recommendations  No PT follow up    Barriers to Discharge        Equipment Recommendations  None recommended by PT    Recommendations for Other Services    Frequency     Plan All goals met and education completed, patient dischaged from PT services    Precautions / Restrictions Precautions Precautions: Fall Restrictions Weight Bearing Restrictions: No   Pertinent Vitals/Pain No c/o pain    Mobility  Bed Mobility Bed Mobility: Supine to Sit Supine to Sit: 7: Independent;HOB flat Transfers Transfers: Sit to Stand;Stand to Sit Sit to Stand: 7: Independent Stand to Sit: 7: Independent Ambulation/Gait Ambulation/Gait Assistance: 6: Modified independent (Device/Increase time) Ambulation Distance (Feet): 300 Feet Assistive device: Other (Comment) (IV pole) Ambulation/Gait Assistance Details: Pt O2 sats dropped to low 80s on room air. Sats increased to 90s on 4L O2 via [place].   Gait Pattern: Within Functional Limits Stairs: Yes Stairs Assistance: 7: Independent Stair Management Technique: No rails Number of Stairs: 2  Wheelchair Mobility Wheelchair Mobility: No    Exercises     PT Diagnosis:    PT Problem List:   PT Treatment Interventions:     PT Goals Acute Rehab PT Goals PT Goal Formulation: With patient Time For Goal Achievement: 11/21/11 Potential to Achieve Goals: Good Pt will go Supine/Side to Sit: Independently PT Goal: Supine/Side to Sit - Progress: Met Pt will Sit at Edge of Bed: Independently;6-10 min;with no upper extremity support PT Goal: Sit at Edge Of Bed  - Progress: Met Pt will go Sit to Stand: Independently PT Goal: Sit to Stand - Progress: Met Pt will Ambulate: 51 - 150 feet;with modified independence;with least restrictive assistive device PT Goal: Ambulate - Progress: Met Pt will Go Up / Down Stairs: 1-2 stairs;with modified independence;with least restrictive assistive device PT Goal: Up/Down Stairs - Progress: Met  Visit Information  Last PT Received On: 11/17/11    Subjective Data  Subjective: will my oxygen get better if I walk more?   Cognition  Overall Cognitive Status: Appears within functional limits for tasks assessed/performed Arousal/Alertness: Awake/alert Orientation Level: Appears intact for tasks assessed Behavior During Session: [facility] for tasks performed    Balance  Balance Balance Assessed: No  End of Session PT - End of Session Equipment Utilized During Treatment: Gait belt Activity Tolerance: Patient tolerated treatment well Patient left: in chair;with call bell/phone within reach;with family/visitor present Nurse Communication: Mobility status    Kyle Patel 11/17/2011, 4:28 PM [place]. Bobbijo Holst DPT [phone]

## 2011-11-17 NOTE — Progress Notes (Signed)
Name: Kyle Patel MRN: [id number] DOB: 1945/10/07    LOS: 7  Referring Provider:  Adriana Simas MD EDP Reason for Referral:  Pulmonary embolism PCP: Shaune Pollack  PULMONARY / CRITICAL CARE MEDICINE Brief patient profile:  9 yobm with  HTN who presented to the [facility] ED on 6/17 with several days of shortness of breath and was found to have a large pulmonary embolism.  He states that for 5 to 6 days prior to admission he had noted near constant dyspnea and a pain in his R chest that was worse with lying flat > to urgent care on 6/17 and was referred to the ED for further management and found to saddle pe and L dvt    Subjective/ Overnight: Still requiring 6L [place] but tol ambulation.   Occasional pleuritic pain with inspiration.     Vital Signs: Temp:  [97.6 F (36.4 C)-99.2 F (37.3 C)] 99 F (37.2 C) (06/24 0518) Pulse Rate:  [79-97] 87  (06/24 0518) Resp:  [18-20] 18  (06/24 0518) BP: (117-139)/(55-79) 139/73 mmHg (06/24 0518) SpO2:  [93 %-98 %] 97 % (06/24 0518) Weight:  [193 lb 9 oz (87.8 kg)] 193 lb 9 oz (87.8 kg) (06/23 2210) On 6lpm   Physical Examination: Gen: resting comfortably, no acute distress HEENT: NCAT, PERRL, EOMi, OP clear, neck supple without masses PULM: CTA B, speaking in complete sentences CV: RRR, no mgr, no JVD/ slt increase P2 AB: BS+, soft, nontender, no hsm Ext: warm, no edema, no clubbing, no cyanosis Derm: no rash or skin breakdown Neuro: alert, approp, no defecits  Active Problems:  Pulmonary embolism  Hypoxemia  Hypertension  CKD (chronic kidney disease)  ASSESSMENT AND PLAN   6/17 CT Angio Chest>>>Saddle pulmonary embolism, no pulmonary mass or lymphadenopathy 6/18 duplex>>Positive for DVT in the left femoral, popliteal, and posterior tibial veins.  Echo 6/18 >> RV mildly dilated with PAP of 76 mmHg.   A:  Acute large pulmonary embolism.  No hemodynamic compromise.   No obvious risk factors  P:   - Heparin protocol,   transitioning to coumadin --  INR still sub therapeutic, will need with 48 h overlap inr - F/u hypercoagulable work up negative but will need additional work up once off anticoagulation but all negative as of now. - Should he worsen clinically there is currently no contraindication to thrombolysis. 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 - IS per RT protocol. - Titrate down O2 as tol - cont ambulate as tol, monitor O2 needs     HTN  P:  - Continue home dilt but hold off clonidine since BP appears ok for now.   CKD, Creat improving.  Lab 11/15/11 0440 11/14/11 0355 11/13/11 0420 11/12/11 0420 11/11/11 0430  CREATININE 1.32 1.53* 1.68* 1.61* 1.43*   P:   -Renal dosing of meds.    ID: febrile on 6/19, cultures sent and levaquin started but changed to unasyn due to itch.    Fever likely due to PE not infection.  - Dc abx 6/22 - No signicant fever since 6/19    [facility], NP 11/17/2011  9:45 AM Pager: [phone]  *Care during the described time interval was provided by me and/or other providers on the critical care team. I have reviewed this patient's available data, including medical history, events of note, physical examination and test results as  part of my evaluation.    Dr. Kalman Shan, M.D., [facility]C.P Pulmonary and Critical Care Medicine Staff Physician [facility] [place] Pulmonary and Critical Care Pager: [phone], If no answer or between  15:00h - 7:00h: call [phone]  11/17/2011 11:22 PM

## 2011-11-17 NOTE — Progress Notes (Signed)
At about 0234 pt sleeping and HR up to 259 on tele monitor. Pt awakened and sweaty but denies any symptoms. BP 123/72 and HR down to 88. Dr [name] made aware. No new orders. On looking closer at strip noted pt in rhythm about 4 min. Strips faxed to [employer]ink for MD to view. Pt continues to be asymptomatic. Will post strips and cont to monitor.

## 2011-11-17 NOTE — Progress Notes (Signed)
ANTICOAGULATION CONSULT NOTE - Follow Up Consult  Pharmacy Consult for Heparin and Coumadin Indication: DVT/PE  Allergies  Allergen Reactions  . Levaquin (Levofloxacin) Itching    Patient Measurements: Height: 5\' 9"  (175.3 cm) Weight: 193 lb 9 oz (87.8 kg) IBW/kg (Calculated) : 70.7  Heparin Dosing Weight: 80kg  Vital Signs: Temp: 99 F (37.2 C) (06/24 0518) Temp src: Oral (06/24 0518) BP: 139/73 mmHg (06/24 0518) Pulse Rate: 87  (06/24 0518)  Labs:  Alvira Philips 11/17/11 0603 11/16/11 0536 11/15/11 0440  HGB 10.0* 10.0* --  HCT 30.3* 29.7* 30.3*  PLT 295 270 245  APTT -- -- --  LABPROT 21.9* 17.4* 14.6  INR 1.88* 1.40 1.12  HEPARINUNFRC 0.43 0.40 0.41  CREATININE -- -- 1.32  CKTOTAL -- -- --  CKMB -- -- --  TROPONINI -- -- --    Estimated Creatinine Clearance: 62 ml/min (by C-G formula based on Cr of 1.32).   Medications:  Heparin 1150 units/hr  Assessment: 66yom on Heparin bridging to Coumadin (Day 5 of minimum 5 Day overlap) for saddle PE and DVT. Heparin level (0.43) remains therapeutic. INR (1.88) is subtherapeutic but trending up - will still need Heparin overlap until INR therapeutic x 2 per guidelines. - H/H and Plts stable - No significant bleeding reported  Goal of Therapy:  INR 2-3 Heparin level 0.3-0.7 units/ml Monitor platelets by anticoagulation protocol: Yes   Plan:  1. Continue Heparin 1150 units/hr (11.5 ml/hr) 2. Coumadin 8mg  po x 1 today 3. Follow-up AM INR, CBC and heparin level  Cleon Dew [phone] 11/17/2011,8:58 AM

## 2011-11-18 LAB — CULTURE, BLOOD (ROUTINE X 2)
Culture  Setup Time: 201306192240
Culture: NO GROWTH

## 2011-11-18 LAB — CBC
Platelets: 341 10*3/uL (ref 150–400)
RBC: 3.3 MIL/uL — ABNORMAL LOW (ref 4.22–5.81)
RDW: 14.1 % (ref 11.5–15.5)
WBC: 10.7 10*3/uL — ABNORMAL HIGH (ref 4.0–10.5)

## 2011-11-18 LAB — HEPARIN LEVEL (UNFRACTIONATED): Heparin Unfractionated: 0.44 IU/mL (ref 0.30–0.70)

## 2011-11-18 LAB — BASIC METABOLIC PANEL
Calcium: 9.4 mg/dL (ref 8.4–10.5)
Creatinine, Ser: 1.33 mg/dL (ref 0.50–1.35)
GFR calc non Af Amer: 55 mL/min — ABNORMAL LOW (ref >90)
Sodium: 137 mEq/L (ref 135–145)

## 2011-11-18 LAB — GLUCOSE, CAPILLARY: Glucose-Capillary: 114 mg/dL — ABNORMAL HIGH (ref 70–99)

## 2011-11-18 LAB — PROTIME-INR: INR: 2.14 — ABNORMAL HIGH (ref 0.00–1.49)

## 2011-11-18 MED ORDER — WARFARIN SODIUM 4 MG PO TABS
8.0000 mg | ORAL_TABLET | Freq: Once | ORAL | Status: AC
  Administered 2011-11-18: 8 mg via ORAL
  Filled 2011-11-18: qty 2

## 2011-11-18 NOTE — Progress Notes (Signed)
Pt oxygen saturation rechecked and it was 88% on room air before ambulation. While ambulating pt oxygen saturation went from 82%-86%, pt ambulated from his room all the way to the other side of the hallway and back. Pt was talking with me as he walked and when I asked did he feel any distress or that he was loosing his breath he said, "No I feel fine". I asked him again when we were getting closer to the nurses station was he feeling ok, did he have any difficulty breathing or loosing his breath he said, "no I'm fine". When talking when he walked at one time his words were getting a little choppy. But he just took some deep breathes and O2 sat went up to 86%. O2 saturation never got higher than 88%. After pt was walked and was sitting on side of the bed, his O2 sat was rechecked and it was 84% on room air, placed him on 2L oxygen N/C. I told him we will keep an eye on his O2 sat's and see if he can come off the oxygen again. Will continue to monitor.

## 2011-11-18 NOTE — Progress Notes (Signed)
  ANTICOAGULATION CONSULT NOTE - Follow Up Consult  Pharmacy Consult for Heparin and Coumadin Indication: DVT/PE  Allergies  Allergen Reactions  . Levaquin (Levofloxacin) Itching    Patient Measurements: Height: 5\' 9"  (175.3 cm) Weight: 193 lb 5.5 oz (87.7 kg) IBW/kg (Calculated) : 70.7  Heparin Dosing Weight: 80kg  Vital Signs: Temp: 98.4 F (36.9 C) (06/25 0537) Temp src: Oral (06/25 0537) BP: 126/71 mmHg (06/25 0537) Pulse Rate: 76  (06/25 0537)  Labs:  Basename 11/18/11 0535 11/17/11 0603 11/16/11 0536  HGB 10.0* 10.0* --  HCT 29.7* 30.3* 29.7*  PLT 341 295 270  APTT -- -- --  LABPROT 24.3* 21.9* 17.4*  INR 2.14* 1.88* 1.40  HEPARINUNFRC 0.44 0.43 0.40  CREATININE 1.33 -- --  CKTOTAL -- -- --  CKMB -- -- --  TROPONINI -- -- --    Estimated Creatinine Clearance: 61.5 ml/min (by C-G formula based on Cr of 1.33).   Medications:  Heparin 1150 units/hr  Assessment: 66yom on Heparin bridging to Coumadin (overlap Day 6) for saddle PE and DVT. Heparin level (0.44) remains therapeutic. INR (2.14) is now therapeutic. If INR is therapeutic tomorrow, recommend to discontinue heparin overlap per guidelines. - H/H stable, Plts improving - No significant bleeding reported  Goal of Therapy:  INR 2-3 Heparin level 0.3-0.7 units/ml Monitor platelets by anticoagulation protocol: Yes   Plan:  1. Continue Heparin drip 1150 units/hr - recommend to discontinue tomorrow if INR remains therapeutic 2. Coumadin 8mg  po x 1 today 3. Follow-up AM INR, CBC and heparin level   Cleon Dew [phone] 11/18/2011,9:12 AM

## 2011-11-18 NOTE — Progress Notes (Signed)
Pt oxygen saturation checked on room air and its 96% will recheck in 20 minutes.

## 2011-11-18 NOTE — Progress Notes (Signed)
Name: Kyle Patel MRN: [id number] DOB: Nov 30, 1945    LOS: 8  Referring Provider:  Adriana Simas MD EDP Reason for Referral:  Pulmonary embolism PCP: Shaune Pollack  PULMONARY / CRITICAL CARE MEDICINE Brief patient profile:  4 yobm with  HTN who presented to the [facility] ED on 6/17 with several days of shortness of breath and was found to have a large pulmonary embolism.  He states that for 5 to 6 days prior to admission he had noted near constant dyspnea and a pain in his R chest that was worse with lying flat > to urgent care on 6/17 and was referred to the ED for further management and found to saddle pe and L dvt    Subjective/ Overnight: On 6 l [place] but has not been weaned at all.   Vital Signs: Temp:  [97.6 F (36.4 C)-99.2 F (37.3 C)] 98.2 F (36.8 C) (06/25 1000) Pulse Rate:  [76-93] 93  (06/25 1000) Resp:  [18] 18  (06/25 1000) BP: (106-135)/(55-71) 106/55 mmHg (06/25 1000) SpO2:  [95 %-100 %] 96 % (06/25 1000) Weight:  [193 lb 5.5 oz (87.7 kg)] 193 lb 5.5 oz (87.7 kg) (06/24 2122) On 6lpm   Physical Examination: Gen: resting comfortably, no acute distress HEENT: NCAT, PERRL, EOMi, OP clear, neck supple without masses PULM: CTA B, speaking in complete sentences CV: RRR, no mgr, no JVD/ slt increase P2 AB: BS+, soft, nontender, no hsm Ext: warm, no edema, no clubbing, no cyanosis Derm: no rash or skin breakdown Neuro: alert, approp, no defecits  Active Problems:  Pulmonary embolism  Hypoxemia  Hypertension  CKD (chronic kidney disease)  ASSESSMENT AND PLAN   6/17 CT Angio Chest>>>Saddle pulmonary embolism, no pulmonary mass or lymphadenopathy 6/18 duplex>>Positive for DVT in the left femoral, popliteal, and posterior tibial veins.  Echo 6/18 >> RV mildly dilated with PAP of 76 mmHg.   A:  Acute large pulmonary embolism.  No hemodynamic compromise.   No obvious risk factors  P:   - Heparin protocol,   transitioning to coumadin -- INR still sub therapeutic, will need with 48  h overlap inr - F/u hypercoagulable work up negative but will need additional work up once off anticoagulation but all negative as of now. - Should he worsen clinically there is currently no contraindication to thrombolysis. 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 - IS per RT protocol. - Titrate down O2 as tol. 6/25 dc O2 and restart if sats go below 88% - cont ambulate as tol, monitor O2 needs     HTN  P:  - Continue home dilt but hold off clonidine since BP appears ok for now.   CKD, Creat improving.  Lab 11/18/11 0535 11/15/11 0440 11/14/11 0355 11/13/11 0420 11/12/11 0420  CREATININE 1.33 1.32 1.53* 1.68* 1.61*   P:   -Renal dosing of meds.    ID: febrile on 6/19, cultures sent and levaquin started but changed to unasyn due to itch.    Fever likely due to PE not infection.  - Dc abx 6/22 - No signicant fever since 6/19  Global -plan on dc 6/26   [facility] Minor ACNP Adolph Pollack PCCM Pager [phone] till 3 pm If no answer page [phone] 11/18/2011, 2:02 PM   STAFF NOTE  Lab 11/18/11 0535 11/17/11 0603 11/16/11 0536 11/15/11 0440 11/14/11 1300  INR  2.14* 1.88* 1.40 1.12 1.13    His INR is now therapeutic . RN has weaned him to 2L [place]. He can go home tomorrow once we establish 24h of overlap with heparin. Followup with Dr Hassie Bruce. Rest per NP   Dr. Kalman Shan, M.D., [facility].C.P Pulmonary and Critical Care Medicine Staff Physician [facility] [place] Pulmonary and Critical Care Pager: [phone], If no answer or between  15:00h - 7:00h: call [phone]  11/18/2011 5:45 PM

## 2011-11-18 NOTE — Progress Notes (Signed)
Utilization review completed.  

## 2011-11-19 LAB — CBC
Hemoglobin: 10.4 g/dL — ABNORMAL LOW (ref 13.0–17.0)
MCH: 30.1 pg (ref 26.0–34.0)
MCHC: 33.7 g/dL (ref 30.0–36.0)
MCV: 89.6 fL (ref 78.0–100.0)
Platelets: 390 10*3/uL (ref 150–400)

## 2011-11-19 LAB — HEPARIN LEVEL (UNFRACTIONATED): Heparin Unfractionated: 0.48 IU/mL (ref 0.30–0.70)

## 2011-11-19 MED ORDER — WARFARIN SODIUM 4 MG PO TABS
8.0000 mg | ORAL_TABLET | Freq: Once | ORAL | Status: DC
  Filled 2011-11-19: qty 2

## 2011-11-19 MED ORDER — WARFARIN SODIUM 4 MG PO TABS: 8.0000 mg | ORAL_TABLET | Freq: Once | ORAL | Status: DC

## 2011-11-19 NOTE — Progress Notes (Signed)
  ANTICOAGULATION CONSULT NOTE - Follow Up Consult  Pharmacy Consult for Heparin and Coumadin Indication: DVT/PE  Allergies  Allergen Reactions  . Levaquin (Levofloxacin) Itching    Patient Measurements: Height: 5\' 9"  (175.3 cm) Weight: 193 lb 5.5 oz (87.7 kg) IBW/kg (Calculated) : 70.7  Heparin Dosing Weight: 80kg  Vital Signs: Temp: 98.2 F (36.8 C) (06/26 0543) Temp src: Oral (06/26 0543) BP: 125/71 mmHg (06/26 0543) Pulse Rate: 80  (06/26 0543)  Labs:  Alvira Philips 11/19/11 0520 11/18/11 0535 11/17/11 0603  HGB 10.4* 10.0* --  HCT 30.9* 29.7* 30.3*  PLT 390 341 295  APTT -- -- --  LABPROT 24.0* 24.3* 21.9*  INR 2.11* 2.14* 1.88*  HEPARINUNFRC 0.48 0.44 0.43  CREATININE -- 1.33 --  CKTOTAL -- -- --  CKMB -- -- --  TROPONINI -- -- --    Estimated Creatinine Clearance: 61.5 ml/min (by C-G formula based on Cr of 1.33).   Medications:  Heparin 1150 units/hr  Assessment: 66yom on Heparin bridging to Coumadin (overlap Day 7) for saddle PE and DVT. Heparin level (0.48) remains therapeutic. INR (2.11) is therapeutic. Can discontinue heparin overlap per guidelines. - H/H stable, Plts 390 - No significant bleeding reported  Goal of Therapy:  INR 2-3 Heparin level 0.3-0.7 units/ml Monitor platelets by anticoagulation protocol: Yes   Plan:  1. Continue Heparin drip 1150 units/hr - May dc today 2. Coumadin 8mg  po x 1 today, recommend 8mg  daily as outpt. 3. Follow-up AM INR   Talbert Cage [place] [phone] 11/19/2011,8:39 AM

## 2011-11-19 NOTE — Discharge Summary (Signed)
Physician Discharge Summary  Patient ID: MD SMOLA MRN: [id number] DOB/AGE: 1945-06-06 66 y.o.  Admit date: 11/10/2011 Discharge date: 11/19/2011  Problem List Active Problems:  Pulmonary embolism  Hypoxemia  Hypertension  CKD (chronic kidney disease)  HPI: 30 yobm with HTN who presented to the [facility] ED on 6/17 with several days of shortness of breath and was found to have a large pulmonary embolism. He states that for 5 to 6 days prior to admission he had noted near constant dyspnea and a pain in his R chest that was worse with lying flat > to urgent care on 6/17 and was referred to the ED for further management and found to saddle pe and L LE dvt     Physical Examination:  Gen: resting comfortably, no acute distress  HEENT: NCAT, PERRL, EOMi, OP clear, neck supple without masses  PULM: CTA B, speaking in complete sentences  CV: RRR, no mgr, no JVD/ slt increase P2  AB: BS+, soft, nontender, no hsm  Ext: warm, no edema, no clubbing, no cyanosis  Derm: no rash or skin breakdown  Neuro: alert, approp, no defecits    Active Problems:  Pulmonary embolism  Hypoxemia  Hypertension  CKD (chronic kidney disease)   Hospital Course:  ASSESSMENT AND PLAN  6/17 CT Angio Chest>>>Saddle pulmonary embolism, no pulmonary mass or lymphadenopathy  6/18 duplex>>Positive for DVT in the left femoral, popliteal, and posterior tibial veins.  Echo 6/18 >> RV mildly dilated with PAP of 76 mmHg.  A: Acute large pulmonary embolism. No hemodynamic compromise.  No obvious risk factors  P:  - Heparin protocol, transitioned to coumadin -- INR  therapeutic, will need with 48 h overlap inr  - F/u hypercoagulable work up negative but will need additional work up once off anticoagulation but all negative as of now.  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   - Titrate  down O2 as tol. 6/25 dc O2 and restart if sats go below 88%  - cont ambulate as tol, monitor O2 needs   HTN  P:  - Continue home dilt but hold off clonidine since BP appears ok for now.  CKD, Creat improving.   Lab  11/18/11 0535  11/15/11 0440  11/14/11 0355  11/13/11 0420  11/12/11 0420   CREATININE  1.33  1.32  1.53*  1.68*  1.61*    P:  -Renal dosing of meds.  ID: febrile on 6/19, cultures sent and levaquin started but changed to unasyn due to itch. Fever likely due to PE not infection.  - Dc abx 6/22  - No signicant fever since 6/19  Global  dc 6/26     Lab  11/18/11 0535  11/17/11 0603  11/16/11 0536  11/15/11 0440  11/14/11 1300   INR  2.14*  1.88*  1.40  1.12  1.13    His INR is now therapeutic . RN has weaned him to 2L [place]. He can go home . Followup with Dr Hassie Bruce.      Labs at discharge Lab Results  Component Value Date   CREATININE 1.33 11/18/2011   BUN 19 11/18/2011   NA 137 11/18/2011   K 4.5 11/18/2011   CL 104 11/18/2011   CO2 23 11/18/2011   Lab Results  Component Value Date   WBC 11.2* 11/19/2011   HGB 10.4* 11/19/2011  HCT 30.9* 11/19/2011   MCV 89.6 11/19/2011   PLT 390 11/19/2011   Lab Results  Component Value Date   ALT 18 11/10/2011   AST 19 11/10/2011   ALKPHOS 75 11/10/2011   BILITOT 0.4 11/10/2011   Lab Results  Component Value Date   INR 2.11* 11/19/2011   INR 2.14* 11/18/2011   INR 1.88* 11/17/2011    Current radiology studies No results found.  Disposition:    Discharge Orders    Future Appointments: Provider: Department: Dept Phone: Center:   11/21/2011 2:30 PM [facility] [phone] None   11/26/2011 11:45 AM Julio Sicks, NP [facility] [phone] None     Medication List  As of 11/19/2011 12:27 PM   STOP taking these medications         aspirin EC 81 MG tablet      cloNIDine 0.1 MG tablet      ezetimibe-simvastatin 10-40 MG per tablet      fish oil-omega-3 fatty acids 1000 MG  capsule      potassium chloride SA 20 MEQ tablet      SYSTANE OP         TAKE these medications         CENTRUM PO   Take 1 tablet by mouth every morning.      diltiazem 120 MG 24 hr capsule   Commonly known as: CARDIZEM CD   Take 120 mg by mouth daily.      warfarin 4 MG tablet   Commonly known as: COUMADIN   Take 2 tablets (8 mg total) by mouth one time only at 6 PM.           Follow-up Information    Follow up with PARRETT,TAMMY, NP on 11/26/2011. (Tammy Parrett 11:45 am)    Contact information:   [facility] [address] [place] [address] [phone]       Follow up with [facility]. [phone]   [place]  friday june 26 @ 2:30 pm)          Discharged Condition: good Vital signs at Discharge. Temp:  [98.2 F (36.8 C)-99.2 F (37.3 C)] 98.3 F (36.8 C) (06/26 0812) Pulse Rate:  [80-96] 82  (06/26 0812) Resp:  [18-20] 20  (06/26 0812) BP: (116-131)/(65-71) 130/68 mmHg (06/26 0812) SpO2:  [84 %-98 %] 96 % (06/26 0812) Weight:  [193 lb 5.5 oz (87.7 kg)] 193 lb 5.5 oz (87.7 kg) (06/25 2115) Office follow up Special Information or instructions. Follow up with T. Parrett. Note he wants to establish with one of [facility]. Follow up at coumadin clinic. Signed: Brett Canales Minor ACNP Adolph Pollack PCCM Pager [phone] till 3 pm If no answer page [phone] 11/19/2011, 12:27 PM  Independently examined pt, evaluated data & formulated above care plan with NP  [facility]

## 2011-11-19 NOTE — Progress Notes (Signed)
Discharge instructions given to patient.  Patient and family member verbalized understanding. PIVs removed.

## 2011-11-21 ENCOUNTER — Ambulatory Visit (INDEPENDENT_AMBULATORY_CARE_PROVIDER_SITE_OTHER): Payer: 59 | Admitting: *Deleted

## 2011-11-21 DIAGNOSIS — I824Y9 Acute embolism and thrombosis of unspecified deep veins of unspecified proximal lower extremity: Secondary | ICD-10-CM | POA: Insufficient documentation

## 2011-11-21 DIAGNOSIS — Z7901 Long term (current) use of anticoagulants: Secondary | ICD-10-CM | POA: Insufficient documentation

## 2011-11-21 DIAGNOSIS — I2699 Other pulmonary embolism without acute cor pulmonale: Secondary | ICD-10-CM

## 2011-11-21 LAB — POCT INR: INR: 3.9

## 2011-11-21 MED ORDER — WARFARIN SODIUM 4 MG PO TABS: ORAL_TABLET | ORAL | Status: DC

## 2011-11-21 NOTE — Patient Instructions (Addendum)
A full discussion of the nature of anticoagulants has been carried out.  A benefit risk analysis has been presented to the patient, so that they understand the justification for choosing anticoagulation at this time. The need for frequent and regular monitoring, precise dosage adjustment and compliance is stressed.  Side effects of potential bleeding are discussed.  The patient should avoid any OTC items containing aspirin or ibuprofen, and should avoid great swings in general diet.  Avoid alcohol consumption.  Call if any signs of abnormal bleeding. Phone [phone].     

## 2011-11-24 ENCOUNTER — Telehealth: Payer: Self-pay | Admitting: Internal Medicine

## 2011-11-24 NOTE — Telephone Encounter (Signed)
I spoke with Kyle Patel and advised him that the coumadin clinic manages his coumadin. He stated he will need to call them for clarification then. Nothing further was needed from [place]

## 2011-11-26 ENCOUNTER — Ambulatory Visit (INDEPENDENT_AMBULATORY_CARE_PROVIDER_SITE_OTHER): Payer: 59 | Admitting: Adult Health

## 2011-11-26 ENCOUNTER — Encounter: Payer: Self-pay | Admitting: Adult Health

## 2011-11-26 VITALS — BP 138/88 | HR 85 | Temp 97.5°F | Ht 69.5 in | Wt 186.6 lb

## 2011-11-26 DIAGNOSIS — R0902 Hypoxemia: Secondary | ICD-10-CM

## 2011-11-26 DIAGNOSIS — I2699 Other pulmonary embolism without acute cor pulmonale: Secondary | ICD-10-CM

## 2011-11-26 NOTE — Progress Notes (Signed)
  Subjective:    Patient ID: Kyle Patel, male    DOB: 1946/04/08, 66 y.o.   MRN: [id number]  HPI 66 yo male seen for initial pulmonary admission  11/10/11 with Massive PE and LLE DVT  tx w/ coumadin . Hypercoagulable panel neg.  Echo showed severe pulmonary HTN   11/26/2011 [facility]  Returns for post hospital . Admitted 6/17 w/ dyspnea, found to have an acute large PE.  LLE DVT on doppler.  Started on hep/coumadin bridge.  Echo shoed RV mildly dilated. Severe Pulm HTN w/ PAP 76  INR 3.9 on 6/28 , has follow up with [facility] this week.  Hypercoagulable panel was neg  Pulmonary HTN ? Etiology , no known hx prior to admit, autoimmune workup w/ borderline RF, elevated CRP and ESR , neg ANA . ACE nml .  Mother and sister with hx of clots.  No personal  hx of clots.  No recent surgery , prolonged immobiity, known injury or extended air/car travel.  Since discharge feeling much better. No hemoptysis  Dyspnea much less.  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   Review of Systems Constitutional:   No  weight loss, night sweats,  Fevers, chills, fatigue, or  lassitude.  HEENT:   No headaches,  Difficulty swallowing,  Tooth/dental problems, or  Sore throat,                No sneezing, itching, ear ache, nasal congestion, post nasal drip,   CV:  No chest pain,  Orthopnea, PND, swelling in lower extremities, anasarca, dizziness, palpitations, syncope.   GI  No heartburn, indigestion, abdominal pain, nausea, vomiting, diarrhea, change in bowel habits, loss of appetite, bloody stools.   Resp: No shortness of breath with exertion or at rest.  No excess mucus, no productive cough,  No non-productive cough,  No coughing up of blood.  No change in color of mucus.  No wheezing.  No chest wall deformity  Skin: no rash or lesions.  GU: no dysuria, change in color of urine, no urgency or frequency.  No flank pain, no hematuria   MS:  No joint pain or swelling.  No  decreased range of motion.  No back pain.  Psych:  No change in mood or affect. No depression or anxiety.  No memory loss.          Objective:   Physical Exam GEN: A/Ox3; pleasant , NAD  HEENT:  [place]/AT,  EACs-clear, TMs-wnl, NOSE-clear, THROAT-clear, no lesions, no postnasal drip or exudate noted.   NECK:  Supple w/ fair ROM; no JVD; normal carotid impulses w/o bruits; no thyromegaly or nodules palpated; no lymphadenopathy.  RESP  Clear  P & A; w/o, wheezes/ rales/ or rhonchi.no accessory muscle use, no dullness to percussion  CARD:  RRR, no m/r/g  , tr  peripheral edema, pulses intact, no cyanosis or clubbing.  GI:   Soft & nt; nml bowel sounds; no organomegaly or masses detected.  Musco: Warm bil, no deformities or joint swelling noted.   Neuro: alert, no focal deficits noted.    Skin: Warm, no lesions or rashes         Assessment & Plan:

## 2011-11-26 NOTE — Patient Instructions (Addendum)
Continue on current regimen.  Follow up with coumadin clinic as directed.  follow up Dr. [name]  In 3-4 weeks with PFT  Please contact office for sooner follow up if symptoms do not improve or worsen or seek emergency care

## 2011-11-26 NOTE — Assessment & Plan Note (Addendum)
?   Etiology w/ neg hypercoagulable panel  Will need futher evalution in future of pulmonary HTN  Cont w/ coumadin clinic  And consider repeat echo in couple of months.  Will have him return in 4 weeks w/ PFT to evaluate for underlying COPD (former smoker)

## 2011-11-28 ENCOUNTER — Ambulatory Visit (INDEPENDENT_AMBULATORY_CARE_PROVIDER_SITE_OTHER): Payer: 59 | Admitting: *Deleted

## 2011-11-28 DIAGNOSIS — Z7901 Long term (current) use of anticoagulants: Secondary | ICD-10-CM

## 2011-11-28 DIAGNOSIS — I2699 Other pulmonary embolism without acute cor pulmonale: Secondary | ICD-10-CM

## 2011-11-28 DIAGNOSIS — I824Y9 Acute embolism and thrombosis of unspecified deep veins of unspecified proximal lower extremity: Secondary | ICD-10-CM

## 2011-11-28 LAB — POCT INR: INR: 5.9

## 2011-12-05 ENCOUNTER — Ambulatory Visit (INDEPENDENT_AMBULATORY_CARE_PROVIDER_SITE_OTHER): Payer: 59

## 2011-12-05 DIAGNOSIS — I2699 Other pulmonary embolism without acute cor pulmonale: Secondary | ICD-10-CM

## 2011-12-05 DIAGNOSIS — Z7901 Long term (current) use of anticoagulants: Secondary | ICD-10-CM

## 2011-12-05 DIAGNOSIS — I824Y9 Acute embolism and thrombosis of unspecified deep veins of unspecified proximal lower extremity: Secondary | ICD-10-CM

## 2011-12-12 ENCOUNTER — Ambulatory Visit (INDEPENDENT_AMBULATORY_CARE_PROVIDER_SITE_OTHER): Payer: 59 | Admitting: *Deleted

## 2011-12-12 DIAGNOSIS — I824Y9 Acute embolism and thrombosis of unspecified deep veins of unspecified proximal lower extremity: Secondary | ICD-10-CM

## 2011-12-12 DIAGNOSIS — I2699 Other pulmonary embolism without acute cor pulmonale: Secondary | ICD-10-CM

## 2011-12-12 DIAGNOSIS — Z7901 Long term (current) use of anticoagulants: Secondary | ICD-10-CM

## 2011-12-12 LAB — POCT INR: INR: 2.6

## 2011-12-26 ENCOUNTER — Ambulatory Visit (INDEPENDENT_AMBULATORY_CARE_PROVIDER_SITE_OTHER): Payer: 59

## 2011-12-26 DIAGNOSIS — I824Y9 Acute embolism and thrombosis of unspecified deep veins of unspecified proximal lower extremity: Secondary | ICD-10-CM

## 2011-12-26 DIAGNOSIS — I2699 Other pulmonary embolism without acute cor pulmonale: Secondary | ICD-10-CM

## 2011-12-26 DIAGNOSIS — Z7901 Long term (current) use of anticoagulants: Secondary | ICD-10-CM

## 2011-12-26 LAB — POCT INR: INR: 1.8

## 2011-12-31 ENCOUNTER — Other Ambulatory Visit (HOSPITAL_COMMUNITY): Payer: Self-pay | Admitting: Pulmonary Disease

## 2011-12-31 DIAGNOSIS — I739 Peripheral vascular disease, unspecified: Secondary | ICD-10-CM

## 2012-01-01 ENCOUNTER — Ambulatory Visit (HOSPITAL_COMMUNITY)
Admission: RE | Admit: 2012-01-01 | Discharge: 2012-01-01 | Disposition: A | Discharge status: Home / Self Care | Payer: 59 | Source: Ambulatory Visit | Attending: Pulmonary Disease | Admitting: Pulmonary Disease

## 2012-01-01 DIAGNOSIS — I739 Peripheral vascular disease, unspecified: Secondary | ICD-10-CM | POA: Insufficient documentation

## 2012-01-01 DIAGNOSIS — I10 Essential (primary) hypertension: Secondary | ICD-10-CM | POA: Insufficient documentation

## 2012-01-09 ENCOUNTER — Ambulatory Visit (INDEPENDENT_AMBULATORY_CARE_PROVIDER_SITE_OTHER): Payer: 59 | Admitting: *Deleted

## 2012-01-09 DIAGNOSIS — Z7901 Long term (current) use of anticoagulants: Secondary | ICD-10-CM

## 2012-01-09 DIAGNOSIS — I2699 Other pulmonary embolism without acute cor pulmonale: Secondary | ICD-10-CM

## 2012-01-09 DIAGNOSIS — I824Y9 Acute embolism and thrombosis of unspecified deep veins of unspecified proximal lower extremity: Secondary | ICD-10-CM

## 2012-01-09 LAB — POCT INR: INR: 2.4

## 2012-01-19 ENCOUNTER — Encounter: Payer: Self-pay | Admitting: Pulmonary Disease

## 2012-01-19 ENCOUNTER — Ambulatory Visit (INDEPENDENT_AMBULATORY_CARE_PROVIDER_SITE_OTHER): Payer: 59 | Admitting: Pulmonary Disease

## 2012-01-19 VITALS — BP 152/78 | HR 64 | Temp 97.5°F | Ht 70.0 in | Wt 186.0 lb

## 2012-01-19 DIAGNOSIS — I2699 Other pulmonary embolism without acute cor pulmonale: Secondary | ICD-10-CM

## 2012-01-19 DIAGNOSIS — R252 Cramp and spasm: Secondary | ICD-10-CM | POA: Insufficient documentation

## 2012-01-19 LAB — PULMONARY FUNCTION TEST

## 2012-01-19 MED ORDER — QUININE SULFATE 324 MG PO CAPS: 324.0000 mg | ORAL_CAPSULE | Freq: Every day | ORAL | Status: AC

## 2012-01-19 NOTE — Progress Notes (Signed)
PFT done today. 

## 2012-01-19 NOTE — Assessment & Plan Note (Signed)
Trial of quinine at bedtime - rechk INR

## 2012-01-19 NOTE — Patient Instructions (Addendum)
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

## 2012-01-19 NOTE — Assessment & Plan Note (Signed)
Coumadin x 46m , perhaps 1 yr - some lifelong risk remains for recurrence Rechk INR 1 wk after starting quinine Rpt echi in dec to check RVSP If surgery absolutely required, wait until after September (31m mark) - will need Sq lovenox bridge before & after surgery

## 2012-01-19 NOTE — Progress Notes (Signed)
  Subjective:    Patient ID: Kyle Patel, male    DOB: August 30, 1945, 66 y.o.   MRN: [id number]  HPI  PCP - Juanetta Gosling  66 yo remote ex smoker, seen for initial pulmonary admission 11/10/11 with Massive PE and LLE DVT  tx w/ coumadin . Hypercoagulable panel neg.  Echo showed severe pulmonary HTN, RV mildly dilated. Severe Pulm HTN w/ PAP 76  INR 3.9 on 6/28 , [facility] with [facility] this [facility].  Hypercoagulable panel was neg  Pulmonary HTN ? Etiology , no known hx prior to admit, autoimmune workup w/ borderline RF, elevated CRP and ESR , neg ANA . ACE nml .  Mother and sister with hx of clots.  No recent surgery , prolonged immobiity, known injury or extended air/car travel.  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 No hemoptysis  Dyspnea much less. 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  He is undergoing back evaluation for pinched nerve & considering surgery      Review of Systems neg for any significant sore throat, dysphagia, itching, sneezing, nasal congestion or excess/ purulent secretions, fever, chills, sweats, unintended wt loss, pleuritic or exertional cp, hempoptysis, orthopnea pnd or change in chronic leg swelling. Also denies presyncope, palpitations, heartburn, abdominal pain, nausea, vomiting, diarrhea or change in bowel or urinary habits, dysuria,hematuria, rash, arthralgias, visual complaints, headache, numbness weakness or ataxia.     Objective:   Physical Exam  Gen. Pleasant, well-nourished, in no distress ENT - no lesions, no post nasal drip Neck: No JVD, no thyromegaly, no carotid bruits Lungs: no use of accessory muscles, no dullness to percussion, clear without rales or rhonchi  Cardiovascular: Rhythm regular, heart sounds  normal, no murmurs or gallops, no peripheral edema Musculoskeletal: No deformities, no cyanosis or clubbing           Assessment & Plan:

## 2012-01-22 ENCOUNTER — Other Ambulatory Visit (HOSPITAL_COMMUNITY): Payer: 59

## 2012-01-30 ENCOUNTER — Ambulatory Visit (INDEPENDENT_AMBULATORY_CARE_PROVIDER_SITE_OTHER): Payer: 59 | Admitting: Pharmacist

## 2012-01-30 DIAGNOSIS — Z7901 Long term (current) use of anticoagulants: Secondary | ICD-10-CM

## 2012-01-30 DIAGNOSIS — I2699 Other pulmonary embolism without acute cor pulmonale: Secondary | ICD-10-CM

## 2012-01-30 DIAGNOSIS — I824Y9 Acute embolism and thrombosis of unspecified deep veins of unspecified proximal lower extremity: Secondary | ICD-10-CM

## 2012-01-30 LAB — POCT INR: INR: 2.8

## 2012-02-27 ENCOUNTER — Ambulatory Visit (INDEPENDENT_AMBULATORY_CARE_PROVIDER_SITE_OTHER): Payer: 59 | Admitting: Pharmacist

## 2012-02-27 DIAGNOSIS — Z7901 Long term (current) use of anticoagulants: Secondary | ICD-10-CM

## 2012-02-27 DIAGNOSIS — I2699 Other pulmonary embolism without acute cor pulmonale: Secondary | ICD-10-CM

## 2012-02-27 DIAGNOSIS — I824Y9 Acute embolism and thrombosis of unspecified deep veins of unspecified proximal lower extremity: Secondary | ICD-10-CM

## 2012-02-27 LAB — POCT INR: INR: 1.8

## 2012-03-08 ENCOUNTER — Other Ambulatory Visit: Payer: Self-pay | Admitting: Neurosurgery

## 2012-03-19 ENCOUNTER — Ambulatory Visit (INDEPENDENT_AMBULATORY_CARE_PROVIDER_SITE_OTHER): Payer: 59 | Admitting: General Practice

## 2012-03-19 DIAGNOSIS — I824Y9 Acute embolism and thrombosis of unspecified deep veins of unspecified proximal lower extremity: Secondary | ICD-10-CM

## 2012-03-19 DIAGNOSIS — I2699 Other pulmonary embolism without acute cor pulmonale: Secondary | ICD-10-CM

## 2012-03-19 DIAGNOSIS — Z7901 Long term (current) use of anticoagulants: Secondary | ICD-10-CM

## 2012-03-19 LAB — POCT INR: INR: 1.7

## 2012-04-09 ENCOUNTER — Ambulatory Visit (INDEPENDENT_AMBULATORY_CARE_PROVIDER_SITE_OTHER): Payer: 59 | Admitting: General Practice

## 2012-04-09 DIAGNOSIS — I824Y9 Acute embolism and thrombosis of unspecified deep veins of unspecified proximal lower extremity: Secondary | ICD-10-CM

## 2012-04-09 DIAGNOSIS — Z7901 Long term (current) use of anticoagulants: Secondary | ICD-10-CM

## 2012-04-09 DIAGNOSIS — I2699 Other pulmonary embolism without acute cor pulmonale: Secondary | ICD-10-CM

## 2012-04-09 LAB — POCT INR: INR: 2.2

## 2012-04-09 NOTE — Patient Instructions (Signed)
Patient will have back surgery on 12-13 and will come to coumadin clinic on 12-6 for INR and for instructions on Lovenox bridging.

## 2012-04-21 ENCOUNTER — Encounter (HOSPITAL_COMMUNITY): Payer: Self-pay | Admitting: Pharmacy Technician

## 2012-04-26 ENCOUNTER — Telehealth: Payer: Self-pay | Admitting: *Deleted

## 2012-04-26 ENCOUNTER — Encounter (HOSPITAL_COMMUNITY)
Admission: RE | Admit: 2012-04-26 | Discharge: 2012-04-26 | Payer: 59 | Source: Ambulatory Visit | Attending: Neurosurgery | Admitting: Neurosurgery

## 2012-04-26 NOTE — Telephone Encounter (Signed)
Message messed up pt has appt 04/27/1321:00pm for echo[facility]  and appt to see tammy p 05/05/12@9 :30am no discussion about enb Tobe Sos

## 2012-04-26 NOTE — Pre-Procedure Instructions (Signed)
20 Kyle Patel  04/26/2012   Your procedure is scheduled on:  Friday December 13  Report to [facility] at 5:30 AM.  Call this number if you have problems the morning of surgery: [phone]   Remember:   Do not eat or drink:After Midnight.    Take these medicines the morning of surgery with A SIP OF WATER: Diltiazem  Stop aspirin, Coumadin, Plavix, Effient, and herbal medications 7 days before surgery.     Do not wear jewelry, make-up or nail polish.  Do not wear lotions, powders, or perfumes. You may wear deodorant.  Do not shave 48 hours prior to surgery. Men may shave face and neck.  Do not bring valuables to the hospital.  Contacts, dentures or bridgework may not be worn into surgery.  Leave suitcase in the car. After surgery it may be brought to your room.  For patients admitted to the hospital, checkout time is 11:00 AM the day of discharge.   Patients discharged the day of surgery will not be allowed to drive home.  Name and phone number of your driver: [phone]  Special Instructions: Shower using CHG 2 nights before surgery and the night before surgery.  If you shower the day of surgery use CHG.  Use special wash - you have one bottle of CHG for all showers.  You should use approximately 1/3 of the bottle for each shower.   Please read over the following fact sheets that you were given: Pain Booklet, Coughing and Deep Breathing and Surgical Site Infection Prevention

## 2012-04-26 NOTE — Telephone Encounter (Signed)
Spoke with Marisue Ivan from pre-op at [phone] and she states they have the pt scheduled to come in for pre-op today for and ENB to be done by Dr. Delton Coombes but they do not have any orders. Almyra Free do you know anything about this? Please advise. Carron Curie, CMA

## 2012-04-26 NOTE — Progress Notes (Signed)
Pt states rescheduled to Jan 10.

## 2012-04-26 NOTE — Telephone Encounter (Signed)
Pt called back again with questions about his appt with [facility] tomorrow.  Please call him @  [phone]. Leanora Ivanoff

## 2012-04-27 ENCOUNTER — Other Ambulatory Visit: Payer: Self-pay

## 2012-04-27 ENCOUNTER — Ambulatory Visit (HOSPITAL_COMMUNITY): Payer: 59 | Attending: Cardiovascular Disease | Admitting: Radiology

## 2012-04-27 DIAGNOSIS — Z87891 Personal history of nicotine dependence: Secondary | ICD-10-CM | POA: Insufficient documentation

## 2012-04-27 DIAGNOSIS — I2789 Other specified pulmonary heart diseases: Secondary | ICD-10-CM | POA: Insufficient documentation

## 2012-04-27 DIAGNOSIS — I2699 Other pulmonary embolism without acute cor pulmonale: Secondary | ICD-10-CM | POA: Insufficient documentation

## 2012-04-27 DIAGNOSIS — I10 Essential (primary) hypertension: Secondary | ICD-10-CM | POA: Insufficient documentation

## 2012-04-27 NOTE — Progress Notes (Signed)
Echocardiogram performed.  

## 2012-04-28 ENCOUNTER — Telehealth: Payer: Self-pay | Admitting: Pulmonary Disease

## 2012-04-28 NOTE — Telephone Encounter (Signed)
I spoke with patient about results and he verbalized understanding and had no questions 

## 2012-04-28 NOTE — Telephone Encounter (Signed)
Pt called back again Kyle Patel

## 2012-04-28 NOTE — Telephone Encounter (Signed)
Result Note     Normal echo   -----  [id number] x1

## 2012-04-30 ENCOUNTER — Ambulatory Visit (INDEPENDENT_AMBULATORY_CARE_PROVIDER_SITE_OTHER): Payer: 59 | Admitting: General Practice

## 2012-04-30 DIAGNOSIS — I824Y9 Acute embolism and thrombosis of unspecified deep veins of unspecified proximal lower extremity: Secondary | ICD-10-CM

## 2012-04-30 DIAGNOSIS — I2699 Other pulmonary embolism without acute cor pulmonale: Secondary | ICD-10-CM

## 2012-04-30 DIAGNOSIS — Z7901 Long term (current) use of anticoagulants: Secondary | ICD-10-CM

## 2012-05-03 ENCOUNTER — Telehealth: Payer: Self-pay | Admitting: Pulmonary Disease

## 2012-05-03 MED ORDER — WARFARIN SODIUM 4 MG PO TABS: ORAL_TABLET | ORAL | Status: DC

## 2012-05-03 NOTE — Telephone Encounter (Signed)
Rx was refilled Spoke with pt and notified that this was done Pt verbalized understanding and states nothing further needed

## 2012-05-04 ENCOUNTER — Telehealth: Payer: Self-pay | Admitting: General Practice

## 2012-05-04 NOTE — Telephone Encounter (Signed)
Message copied by Garrison Columbus on Tue May 04, 2012  8:08 AM ------      Message from: Cyril Mourning V      Created: Mon May 03, 2012  5:10 PM       yes      ----- Message -----         From: Garrison Columbus, RN         Sent: 05/03/2012   3:16 PM           To: Oretha Milch, MD            Do you want the above patient to have Lovenox bridging for back surgery on 1/8?  Please advise.             Thanks,      Bailey Mech, RN      [facility]

## 2012-05-05 ENCOUNTER — Encounter: Payer: Self-pay | Admitting: Adult Health

## 2012-05-05 ENCOUNTER — Ambulatory Visit (INDEPENDENT_AMBULATORY_CARE_PROVIDER_SITE_OTHER): Payer: 59 | Admitting: Adult Health

## 2012-05-05 VITALS — BP 126/82 | HR 69 | Temp 97.2°F | Ht 69.5 in | Wt 192.4 lb

## 2012-05-05 DIAGNOSIS — I2699 Other pulmonary embolism without acute cor pulmonale: Secondary | ICD-10-CM

## 2012-05-05 NOTE — Assessment & Plan Note (Signed)
Continue on coumadin , will determine on return length of therapy  Lovenox bridge during upcoming back surgery.  Echo returned to normal , reviewed in detail with pt.  follow up Dr. [name]  In 3-4 months with Dr. [name]

## 2012-05-05 NOTE — Patient Instructions (Addendum)
Continue on current regimen.  Follow up with coumadin clinic as directed.  Lovenox injections per clinic for back surgery.  follow up Dr. [name]  In 3-4 months and As needed   Please contact office for sooner follow up if symptoms do not improve or worsen or seek emergency care

## 2012-05-05 NOTE — Progress Notes (Signed)
  Subjective:    Patient ID: Kyle Patel, male    DOB: [birth year], 66 y.o.   MRN: [id number]  HPI   PCP - Juanetta Gosling  66 yo remote ex smoker, seen for initial pulmonary admission 11/10/11 with Massive PE and LLE DVT  tx w/ coumadin . Hypercoagulable panel neg.  Echo showed severe pulmonary HTN, RV mildly dilated. Severe Pulm HTN w/ PAP 76  INR 3.9 on 6/28 , has [facility] this [facility].  Hypercoagulable panel was neg  Pulmonary HTN ? Etiology , no known hx prior to admit, autoimmune workup w/ borderline RF, elevated CRP and ESR , neg ANA . ACE nml .  Mother and sister with hx of clots.  No recent surgery , prolonged immobiity, known injury or extended air/car travel.  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 No hemoptysis  Dyspnea much less. 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  He is undergoing back evaluation for pinched nerve & considering surgery >no sign changes, quinine added   05/05/2012 Follow up  Returns for 4 month follow up for PE on coumadin therapy.  Doing well with no dypsnea or leg swelling. No hemoptysis .  Had echo 04/29/12 showed sign improvement w/ Nml EF , PAP pressures return to nml, RV nml size.  Has planned back surgery (lumbar laminectomy w/ decompression ) on 06/03/11 , w/ plans on Lovonex bridge during surgery.  INR last week 2.2 .     Review of Systems  neg for any significant sore throat, dysphagia, itching, sneezing, nasal congestion or excess/ purulent secretions, fever, chills, sweats, unintended wt loss, pleuritic or exertional cp, hempoptysis, orthopnea pnd or change in chronic leg swelling. Also denies presyncope, palpitations, heartburn, abdominal pain, nausea, vomiting, diarrhea or change in bowel or urinary habits, dysuria,hematuria, rash, arthralgias, visual complaints, headache, numbness weakness or ataxia.     Objective:   Physical  Exam   Gen. Pleasant, well-nourished, in no distress ENT - no lesions, no post nasal drip Neck: No JVD, no thyromegaly, no carotid bruits Lungs: no use of accessory muscles, no dullness to percussion, clear without rales or rhonchi  Cardiovascular: Rhythm regular, heart sounds  normal, no murmurs or gallops, no peripheral edema Musculoskeletal: No deformities, no cyanosis or clubbing         Assessment & Plan:

## 2012-05-17 ENCOUNTER — Other Ambulatory Visit: Payer: Self-pay | Admitting: General Practice

## 2012-05-17 ENCOUNTER — Telehealth: Payer: Self-pay | Admitting: General Practice

## 2012-05-17 MED ORDER — ENOXAPARIN SODIUM 80 MG/0.8ML ~~LOC~~ SOLN: 80.0000 mg | Freq: Two times a day (BID) | SUBCUTANEOUS | Status: DC

## 2012-05-17 NOTE — Telephone Encounter (Signed)
LMOM for patient to call coumadin clinic to get instructions for holding coumadin and taking Lovenox in preparation for surgery on 1/8.

## 2012-05-20 ENCOUNTER — Encounter (HOSPITAL_COMMUNITY): Payer: Self-pay | Admitting: Pharmacy Technician

## 2012-05-21 ENCOUNTER — Other Ambulatory Visit: Payer: Self-pay | Admitting: General Practice

## 2012-05-21 ENCOUNTER — Ambulatory Visit (HOSPITAL_COMMUNITY)
Admission: RE | Admit: 2012-05-21 | Discharge: 2012-05-21 | Disposition: A | Discharge status: Home / Self Care | Payer: 59 | Source: Ambulatory Visit | Attending: Anesthesiology | Admitting: Anesthesiology

## 2012-05-21 ENCOUNTER — Encounter (HOSPITAL_COMMUNITY): Payer: Self-pay

## 2012-05-21 ENCOUNTER — Encounter (HOSPITAL_COMMUNITY)
Admission: RE | Admit: 2012-05-21 | Discharge: 2012-05-21 | Disposition: A | Discharge status: Home / Self Care | Payer: 59 | Source: Ambulatory Visit | Attending: Neurosurgery | Admitting: Neurosurgery

## 2012-05-21 DIAGNOSIS — M48061 Spinal stenosis, lumbar region without neurogenic claudication: Secondary | ICD-10-CM | POA: Insufficient documentation

## 2012-05-21 DIAGNOSIS — Z01811 Encounter for preprocedural respiratory examination: Secondary | ICD-10-CM | POA: Insufficient documentation

## 2012-05-21 HISTORY — DX: Other pulmonary embolism without acute cor pulmonale: I26.99

## 2012-05-21 LAB — SURGICAL PCR SCREEN
MRSA, PCR: NEGATIVE
Staphylococcus aureus: NEGATIVE

## 2012-05-21 LAB — CBC
HCT: 42.8 % (ref 39.0–52.0)
Hemoglobin: 14.1 g/dL (ref 13.0–17.0)
RDW: 14.7 % (ref 11.5–15.5)
WBC: 9.2 10*3/uL (ref 4.0–10.5)

## 2012-05-21 LAB — BASIC METABOLIC PANEL
Chloride: 108 mEq/L (ref 96–112)
GFR calc Af Amer: 64 mL/min — ABNORMAL LOW (ref >90)
Potassium: 4.7 mEq/L (ref 3.5–5.1)
Sodium: 143 mEq/L (ref 135–145)

## 2012-05-21 MED ORDER — ENOXAPARIN SODIUM 80 MG/0.8ML ~~LOC~~ SOLN: 80.0000 mg | Freq: Two times a day (BID) | SUBCUTANEOUS | Status: DC

## 2012-05-21 NOTE — Progress Notes (Signed)
Pt stated he has an appointment with his Coumadin MD on 05/28/2012 and Coumadin will be stopped and he will start Lovenox injections.

## 2012-05-21 NOTE — Pre-Procedure Instructions (Signed)
20 KERRI ASCHE  05/21/2012   Your procedure is scheduled on:  06/02/2012 Wednesday at 0830 AM  Report to [facility] at 0630 AM.  Call this number if you have problems the morning of surgery: [phone]   Remember:   Do not eat food or drink:After Midnight.      Take these medicines the morning of surgery with A SIP OF WATER: Diltiazem.Coumadin to be stopped on 05/28/2012.   Do not wear jewelry.  Do not wear lotions, You may wear deodorant.   Men may shave face and neck.  Do not bring valuables to the hospital.  Contacts, dentures or bridgework may not be worn into surgery.  Leave suitcase in the car. After surgery it may be brought to your room.  For patients admitted to the hospital, checkout time is 11:00 AM the day of discharge.   Patients discharged the day of surgery will not be allowed to drive home.    Special Instructions: Shower using CHG 2 nights before surgery and the night before surgery.  If you shower the day of surgery use CHG.  Use special wash - you have one bottle of CHG for all showers.  You should use approximately 1/3 of the bottle for each shower.   Please read over the following fact sheets that you were given: Pain Booklet, Coughing and Deep Breathing, MRSA Information and Surgical Site Infection Prevention

## 2012-05-25 NOTE — Consult Note (Addendum)
Anesthesia Chart Review:  Patient is a 66 year old male scheduled for bilateral L5-S1 foraminotomies on 06/02/12 by Dr. Jeral Fruit.  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  PCP is Dr. Kari Baars.  Pulmonologist is Dr. [name]. He was seen by his NP Tammy Parrett on 05/05/12.  Lovenox bridging was recommended for his upcoming surgery.  Follow-up echo on 04/27/12 showed: Left ventricle: The cavity size was normal. There was mild concentric hypertrophy. Systolic function was normal. The estimated ejection fraction was in the range of 55% to60%. Wall motion was normal; there were no regional wall motion abnormalities. Trivial mitral regurgitation. Trivial pulmonic regurgitation.  Trivial tricuspid regurgitation. Pulmonary artery: The main pulmonary artery was normal-sized. Systolic pressure was within the normal range.  EKG on 05/21/12 showed NSR, occasional PACs, right BBB, LAFB, bifascicular block, moderate voltage criteria for LVH.  His EKG was not felt significantly changed since 11/11/11.  (His EKG from 01/06/09 was also similar except with normal QRS interval--incomplete right BBB instead.)   His last stress test was > 10 years ago (negative Cardiolite in 2003).  CXR on 05/21/12 showed: No acute disease in the thorax. 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  PFTs on 01/19/12 showed FEV1 2.49 (79%).  No obstruction, moderate restrictive lung defect.  Preoperative labs noted.  He will need a PT/PTT on the day of surgery.  (He is being seen at [facility] on 05/29/11 to check INR and start his Lovenox bridge.)  I called and spoke with Mr. Dykstra.  He feels his breathing is back to baseline since his PE.  He has since developed numbness in his feet which lead to work-up for the planned neurosurgical procedure.   He has not be particularly active since his PE, but was going to the gym 3X/week and using the treadmill, weights, etc., without chest pain or shortness of breath up to his PE.  Currently he denies chest pain and SOB.     Pulmonology is aware of planned procedure and is starting him on a Lovenox bridge while off Coumadin. His follow-up echo showed improved pulmonary artery pressures since June 2013 with normal EF and wall motion.  Overall, I think his EKG is stable since 2010.   He will be evaluated by his assigned anesthesiologist on the day of surgery, but if follow-up labs are reasonable and there is no acute change in his cardiopulmonary status then would anticipate he could proceed as planned.  Anesthesiologist Dr. Gelene Mink agrees with this plan.  Shonna Chock, PA-C 05/27/12 1535

## 2012-05-28 ENCOUNTER — Ambulatory Visit (INDEPENDENT_AMBULATORY_CARE_PROVIDER_SITE_OTHER): Payer: 59 | Admitting: General Practice

## 2012-05-28 DIAGNOSIS — I2699 Other pulmonary embolism without acute cor pulmonale: Secondary | ICD-10-CM

## 2012-05-28 DIAGNOSIS — I824Y9 Acute embolism and thrombosis of unspecified deep veins of unspecified proximal lower extremity: Secondary | ICD-10-CM

## 2012-05-28 DIAGNOSIS — Z7901 Long term (current) use of anticoagulants: Secondary | ICD-10-CM

## 2012-05-28 NOTE — Patient Instructions (Addendum)
1/3 - Take last dose of coumadin. 1/4 - No coumadin and No Lovenox 1/5 - Take Lovenox in AM and PM (12 hours apart) NO coumadin. 1/6 - Take Lovenox in AM and PM (12 hours apart) NO coumadin 1/7 - Take Lovenox in AM only.  NO Coumadin 1/8 - Surgery (Do not take anything) 1/9 - Take 6 mg coumadin and take Lovenox in AM and PM 1/10 - Take 6 mg coumadin and take Lovenox in AM and PM 1/11 - Take 4 mg coumadin and take Lovenox in AM and PM 1/12 - Take 4 mg coumadin and take Lovenox in AM and PM 1/13 - Take 4 mg coumadin and take Lovenox in AM and PM 1/14 - Re-check in coumadin clinic.

## 2012-06-01 MED ORDER — CEFAZOLIN SODIUM-DEXTROSE 2-3 GM-% IV SOLR
2.0000 g | INTRAVENOUS | Status: AC
  Administered 2012-06-02: 2 g via INTRAVENOUS
  Filled 2012-06-01: qty 50

## 2012-06-02 ENCOUNTER — Encounter (HOSPITAL_COMMUNITY): Payer: Self-pay | Admitting: *Deleted

## 2012-06-02 ENCOUNTER — Encounter (HOSPITAL_COMMUNITY): Payer: Self-pay | Admitting: Vascular Surgery

## 2012-06-02 ENCOUNTER — Inpatient Hospital Stay (HOSPITAL_COMMUNITY)
Admission: RE | Admit: 2012-06-02 | Discharge: 2012-06-04 | DRG: 491 | Disposition: A | Discharge status: Home / Self Care | Payer: 59 | Source: Ambulatory Visit | Attending: Neurosurgery | Admitting: Neurosurgery

## 2012-06-02 ENCOUNTER — Encounter (HOSPITAL_COMMUNITY)
Admission: RE | Disposition: A | Discharge status: Home / Self Care | Payer: Self-pay | Source: Ambulatory Visit | Attending: Neurosurgery

## 2012-06-02 ENCOUNTER — Ambulatory Visit (HOSPITAL_COMMUNITY): Payer: 59 | Admitting: Vascular Surgery

## 2012-06-02 ENCOUNTER — Ambulatory Visit (HOSPITAL_COMMUNITY): Payer: 59

## 2012-06-02 DIAGNOSIS — I10 Essential (primary) hypertension: Secondary | ICD-10-CM | POA: Diagnosis present

## 2012-06-02 DIAGNOSIS — Z7902 Long term (current) use of antithrombotics/antiplatelets: Secondary | ICD-10-CM

## 2012-06-02 DIAGNOSIS — Z86711 Personal history of pulmonary embolism: Secondary | ICD-10-CM

## 2012-06-02 DIAGNOSIS — E7800 Pure hypercholesterolemia, unspecified: Secondary | ICD-10-CM | POA: Diagnosis present

## 2012-06-02 DIAGNOSIS — Z87891 Personal history of nicotine dependence: Secondary | ICD-10-CM

## 2012-06-02 DIAGNOSIS — Z7901 Long term (current) use of anticoagulants: Secondary | ICD-10-CM

## 2012-06-02 DIAGNOSIS — Z881 Allergy status to other antibiotic agents status: Secondary | ICD-10-CM

## 2012-06-02 DIAGNOSIS — M5126 Other intervertebral disc displacement, lumbar region: Principal | ICD-10-CM | POA: Diagnosis present

## 2012-06-02 HISTORY — DX: Unspecified osteoarthritis, unspecified site: M19.90

## 2012-06-02 HISTORY — PX: LUMBAR LAMINECTOMY/DECOMPRESSION MICRODISCECTOMY: SHX5026

## 2012-06-02 HISTORY — PX: LUMBAR LAMINECTOMY: SHX95

## 2012-06-02 LAB — PROTIME-INR
INR: 1.06 (ref 0.00–1.49)
Prothrombin Time: 13.7 seconds (ref 11.6–15.2)

## 2012-06-02 SURGERY — LUMBAR LAMINECTOMY/DECOMPRESSION MICRODISCECTOMY 1 LEVEL
Anesthesia: General | Site: Back | Laterality: Bilateral | Wound class: Clean

## 2012-06-02 MED ORDER — MENTHOL 3 MG MT LOZG: 1.0000 | LOZENGE | OROMUCOSAL | Status: DC | PRN

## 2012-06-02 MED ORDER — PHENOL 1.4 % MT LIQD: 1.0000 | OROMUCOSAL | Status: DC | PRN

## 2012-06-02 MED ORDER — EPHEDRINE SULFATE 50 MG/ML IJ SOLN
INTRAMUSCULAR | Status: DC | PRN
  Administered 2012-06-02: 5 mg via INTRAVENOUS
  Administered 2012-06-02: 10 mg via INTRAVENOUS

## 2012-06-02 MED ORDER — DIAZEPAM 5 MG PO TABS
5.0000 mg | ORAL_TABLET | Freq: Four times a day (QID) | ORAL | Status: DC | PRN
  Administered 2012-06-03 – 2012-06-04 (×3): 5 mg via ORAL
  Filled 2012-06-02 (×3): qty 1

## 2012-06-02 MED ORDER — DILTIAZEM HCL ER COATED BEADS 120 MG PO CP24
120.0000 mg | ORAL_CAPSULE | Freq: Every day | ORAL | Status: DC
  Administered 2012-06-03 – 2012-06-04 (×2): 120 mg via ORAL
  Filled 2012-06-02 (×2): qty 1

## 2012-06-02 MED ORDER — HYDROMORPHONE HCL PF 1 MG/ML IJ SOLN
0.2500 mg | INTRAMUSCULAR | Status: DC | PRN
  Administered 2012-06-02: 0.5 mg via INTRAVENOUS

## 2012-06-02 MED ORDER — SODIUM CHLORIDE 0.9 % IV SOLN: 250.0000 mL | INTRAVENOUS | Status: DC

## 2012-06-02 MED ORDER — SUFENTANIL CITRATE 50 MCG/ML IV SOLN
INTRAVENOUS | Status: DC | PRN
  Administered 2012-06-02 (×4): 10 ug via INTRAVENOUS

## 2012-06-02 MED ORDER — CEFAZOLIN SODIUM 1-5 GM-% IV SOLN
1.0000 g | Freq: Three times a day (TID) | INTRAVENOUS | Status: AC
  Administered 2012-06-02 (×2): 1 g via INTRAVENOUS
  Filled 2012-06-02 (×2): qty 50

## 2012-06-02 MED ORDER — MORPHINE SULFATE (PF) 1 MG/ML IV SOLN
INTRAVENOUS | Status: DC
  Administered 2012-06-02: 6 mg via INTRAVENOUS
  Administered 2012-06-02: 12:00:00 via INTRAVENOUS
  Administered 2012-06-02: 9 mg via INTRAVENOUS
  Administered 2012-06-03: 1.5 mg via INTRAVENOUS
  Filled 2012-06-02: qty 25

## 2012-06-02 MED ORDER — THROMBIN 5000 UNITS EX SOLR
CUTANEOUS | Status: DC | PRN
  Administered 2012-06-02 (×2): 5000 [IU] via TOPICAL

## 2012-06-02 MED ORDER — ONDANSETRON HCL 4 MG/2ML IJ SOLN: 4.0000 mg | INTRAMUSCULAR | Status: DC | PRN

## 2012-06-02 MED ORDER — PROPOFOL 10 MG/ML IV BOLUS
INTRAVENOUS | Status: DC | PRN
  Administered 2012-06-02: 200 mg via INTRAVENOUS

## 2012-06-02 MED ORDER — BUPIVACAINE LIPOSOME 1.3 % IJ SUSP
20.0000 mL | Freq: Once | INTRAMUSCULAR | Status: DC
  Filled 2012-06-02: qty 20

## 2012-06-02 MED ORDER — SODIUM CHLORIDE 0.9 % IJ SOLN: 9.0000 mL | INTRAMUSCULAR | Status: DC | PRN

## 2012-06-02 MED ORDER — BUPIVACAINE LIPOSOME 1.3 % IJ SUSP
INTRAMUSCULAR | Status: DC | PRN
  Administered 2012-06-02: 20 mL

## 2012-06-02 MED ORDER — ZOLPIDEM TARTRATE 5 MG PO TABS: 10.0000 mg | ORAL_TABLET | Freq: Every evening | ORAL | Status: DC | PRN

## 2012-06-02 MED ORDER — MIDAZOLAM HCL 5 MG/5ML IJ SOLN
INTRAMUSCULAR | Status: DC | PRN
  Administered 2012-06-02: 1 mg via INTRAVENOUS

## 2012-06-02 MED ORDER — SODIUM CHLORIDE 0.9 % IV SOLN
INTRAVENOUS | Status: DC
  Administered 2012-06-02 (×2): via INTRAVENOUS

## 2012-06-02 MED ORDER — PHENYLEPHRINE HCL 10 MG/ML IJ SOLN
INTRAMUSCULAR | Status: DC | PRN
  Administered 2012-06-02: 80 ug via INTRAVENOUS

## 2012-06-02 MED ORDER — NEOSTIGMINE METHYLSULFATE 1 MG/ML IJ SOLN
INTRAMUSCULAR | Status: DC | PRN
  Administered 2012-06-02: 5 mg via INTRAVENOUS

## 2012-06-02 MED ORDER — SODIUM CHLORIDE 0.9 % IJ SOLN
3.0000 mL | Freq: Two times a day (BID) | INTRAMUSCULAR | Status: DC
  Administered 2012-06-03 – 2012-06-04 (×3): 3 mL via INTRAVENOUS

## 2012-06-02 MED ORDER — NALOXONE HCL 0.4 MG/ML IJ SOLN: 0.4000 mg | INTRAMUSCULAR | Status: DC | PRN

## 2012-06-02 MED ORDER — ONDANSETRON HCL 4 MG/2ML IJ SOLN
INTRAMUSCULAR | Status: DC | PRN
  Administered 2012-06-02: 4 mg via INTRAVENOUS

## 2012-06-02 MED ORDER — ROCURONIUM BROMIDE 100 MG/10ML IV SOLN
INTRAVENOUS | Status: DC | PRN
  Administered 2012-06-02: 40 mg via INTRAVENOUS

## 2012-06-02 MED ORDER — MORPHINE SULFATE (PF) 1 MG/ML IV SOLN
INTRAVENOUS | Status: AC
  Filled 2012-06-02: qty 25

## 2012-06-02 MED ORDER — ONDANSETRON HCL 4 MG/2ML IJ SOLN: 4.0000 mg | Freq: Four times a day (QID) | INTRAMUSCULAR | Status: DC | PRN

## 2012-06-02 MED ORDER — LIDOCAINE HCL (CARDIAC) 20 MG/ML IV SOLN
INTRAVENOUS | Status: DC | PRN
  Administered 2012-06-02: 80 mg via INTRAVENOUS

## 2012-06-02 MED ORDER — SODIUM CHLORIDE 0.9 % IJ SOLN: 3.0000 mL | INTRAMUSCULAR | Status: DC | PRN

## 2012-06-02 MED ORDER — ONDANSETRON HCL 4 MG/2ML IJ SOLN: 4.0000 mg | Freq: Once | INTRAMUSCULAR | Status: DC | PRN

## 2012-06-02 MED ORDER — DIPHENHYDRAMINE HCL 12.5 MG/5ML PO ELIX: 12.5000 mg | ORAL_SOLUTION | Freq: Four times a day (QID) | ORAL | Status: DC | PRN

## 2012-06-02 MED ORDER — GLYCOPYRROLATE 0.2 MG/ML IJ SOLN
INTRAMUSCULAR | Status: DC | PRN
  Administered 2012-06-02: .6 mg via INTRAVENOUS

## 2012-06-02 MED ORDER — DIPHENHYDRAMINE HCL 50 MG/ML IJ SOLN: 12.5000 mg | Freq: Four times a day (QID) | INTRAMUSCULAR | Status: DC | PRN

## 2012-06-02 MED ORDER — ACETAMINOPHEN 325 MG PO TABS
650.0000 mg | ORAL_TABLET | ORAL | Status: DC | PRN
  Administered 2012-06-03: 650 mg via ORAL
  Filled 2012-06-02: qty 2

## 2012-06-02 MED ORDER — 0.9 % SODIUM CHLORIDE (POUR BTL) OPTIME
TOPICAL | Status: DC | PRN
  Administered 2012-06-02: 1000 mL

## 2012-06-02 MED ORDER — ACETAMINOPHEN 650 MG RE SUPP: 650.0000 mg | RECTAL | Status: DC | PRN

## 2012-06-02 MED ORDER — LACTATED RINGERS IV SOLN
INTRAVENOUS | Status: DC | PRN
  Administered 2012-06-02 (×2): via INTRAVENOUS

## 2012-06-02 MED ORDER — HYDROMORPHONE HCL PF 1 MG/ML IJ SOLN
INTRAMUSCULAR | Status: AC
  Administered 2012-06-02: 0.5 mg via INTRAVENOUS
  Filled 2012-06-02: qty 1

## 2012-06-02 MED ORDER — OXYCODONE-ACETAMINOPHEN 5-325 MG PO TABS
1.0000 | ORAL_TABLET | ORAL | Status: DC | PRN
  Administered 2012-06-03 – 2012-06-04 (×5): 2 via ORAL
  Filled 2012-06-02 (×4): qty 2
  Filled 2012-06-02: qty 1
  Filled 2012-06-02: qty 2

## 2012-06-02 MED ORDER — HEMOSTATIC AGENTS (NO CHARGE) OPTIME
TOPICAL | Status: DC | PRN
  Administered 2012-06-02: 1 via TOPICAL

## 2012-06-02 SURGICAL SUPPLY — 58 items
BENZOIN TINCTURE PRP APPL 2/3 (GAUZE/BANDAGES/DRESSINGS) ×2 IMPLANT
BLADE SURG ROTATE 9660 (MISCELLANEOUS) IMPLANT
BUR ACORN 6.0 (BURR) ×2 IMPLANT
BUR MATCHSTICK NEURO 3.0 LAGG (BURR) ×2 IMPLANT
CANISTER SUCTION 2500CC (MISCELLANEOUS) ×2 IMPLANT
CLOTH BEACON ORANGE TIMEOUT ST (SAFETY) ×2 IMPLANT
CONT SPEC 4OZ CLIKSEAL STRL BL (MISCELLANEOUS) ×2 IMPLANT
DERMABOND ADHESIVE PROPEN (GAUZE/BANDAGES/DRESSINGS) ×1
DERMABOND ADVANCED .7 DNX6 (GAUZE/BANDAGES/DRESSINGS) ×1 IMPLANT
DRAPE LAPAROTOMY 100X72X124 (DRAPES) ×2 IMPLANT
DRAPE MICROSCOPE LEICA (MISCELLANEOUS) ×2 IMPLANT
DRAPE POUCH INSTRU U-SHP 10X18 (DRAPES) ×2 IMPLANT
DRSG PAD ABDOMINAL 8X10 ST (GAUZE/BANDAGES/DRESSINGS) IMPLANT
DURAPREP 26ML APPLICATOR (WOUND CARE) ×4 IMPLANT
ELECT BLADE 4.0 EZ CLEAN MEGAD (MISCELLANEOUS) ×2
ELECT REM PT RETURN 9FT ADLT (ELECTROSURGICAL) ×2
ELECTRODE BLDE 4.0 EZ CLN MEGD (MISCELLANEOUS) ×1 IMPLANT
ELECTRODE REM PT RTRN 9FT ADLT (ELECTROSURGICAL) ×1 IMPLANT
EVACUATOR 1/8 PVC DRAIN (DRAIN) ×2 IMPLANT
GAUZE SPONGE 4X4 16PLY XRAY LF (GAUZE/BANDAGES/DRESSINGS) IMPLANT
GLOVE BIO SURGEON STRL SZ8 (GLOVE) ×2 IMPLANT
GLOVE BIOGEL M 8.0 STRL (GLOVE) ×2 IMPLANT
GLOVE BIOGEL PI IND STRL 8 (GLOVE) ×3 IMPLANT
GLOVE BIOGEL PI INDICATOR 8 (GLOVE) ×3
GLOVE ECLIPSE 7.5 STRL STRAW (GLOVE) ×8 IMPLANT
GLOVE EXAM NITRILE LRG STRL (GLOVE) IMPLANT
GLOVE EXAM NITRILE MD LF STRL (GLOVE) ×2 IMPLANT
GLOVE EXAM NITRILE XL STR (GLOVE) IMPLANT
GLOVE EXAM NITRILE XS STR PU (GLOVE) IMPLANT
GOWN BRE IMP SLV AUR LG STRL (GOWN DISPOSABLE) ×2 IMPLANT
GOWN BRE IMP SLV AUR XL STRL (GOWN DISPOSABLE) ×2 IMPLANT
GOWN STRL REIN 2XL LVL4 (GOWN DISPOSABLE) ×4 IMPLANT
KIT BASIN OR (CUSTOM PROCEDURE TRAY) ×2 IMPLANT
KIT ROOM TURNOVER OR (KITS) ×2 IMPLANT
NEEDLE HYPO 18GX1.5 BLUNT FILL (NEEDLE) IMPLANT
NEEDLE HYPO 21X1.5 SAFETY (NEEDLE) ×2 IMPLANT
NEEDLE HYPO 25X1 1.5 SAFETY (NEEDLE) IMPLANT
NEEDLE SPNL 20GX3.5 QUINCKE YW (NEEDLE) ×2 IMPLANT
NS IRRIG 1000ML POUR BTL (IV SOLUTION) ×2 IMPLANT
PACK LAMINECTOMY NEURO (CUSTOM PROCEDURE TRAY) ×2 IMPLANT
PAD ARMBOARD 7.5X6 YLW CONV (MISCELLANEOUS) ×6 IMPLANT
PATTIES SURGICAL .5 X1 (DISPOSABLE) IMPLANT
RUBBERBAND STERILE (MISCELLANEOUS) ×4 IMPLANT
SPONGE GAUZE 4X4 12PLY (GAUZE/BANDAGES/DRESSINGS) ×2 IMPLANT
SPONGE LAP 4X18 X RAY DECT (DISPOSABLE) IMPLANT
SPONGE SURGIFOAM ABS GEL SZ50 (HEMOSTASIS) ×2 IMPLANT
STRIP CLOSURE SKIN 1/2X4 (GAUZE/BANDAGES/DRESSINGS) ×2 IMPLANT
SUT VIC AB 0 CT1 18XCR BRD8 (SUTURE) ×1 IMPLANT
SUT VIC AB 0 CT1 8-18 (SUTURE) ×1
SUT VIC AB 2-0 CP2 18 (SUTURE) ×2 IMPLANT
SUT VIC AB 3-0 SH 8-18 (SUTURE) ×2 IMPLANT
SYR 20CC LL (SYRINGE) ×4 IMPLANT
SYR 20ML ECCENTRIC (SYRINGE) ×2 IMPLANT
SYR 5ML LL (SYRINGE) IMPLANT
TAPE CLOTH SURG 4X10 WHT LF (GAUZE/BANDAGES/DRESSINGS) ×2 IMPLANT
TOWEL OR 17X24 6PK STRL BLUE (TOWEL DISPOSABLE) ×2 IMPLANT
TOWEL OR 17X26 10 PK STRL BLUE (TOWEL DISPOSABLE) ×2 IMPLANT
WATER STERILE IRR 1000ML POUR (IV SOLUTION) ×2 IMPLANT

## 2012-06-02 NOTE — Anesthesia Preprocedure Evaluation (Addendum)
Anesthesia Evaluation  Patient identified by MRN, date of birth, ID band Patient awake    Reviewed: Allergy & Precautions, H&P , NPO status , Patient's Chart, lab work & pertinent test results, reviewed documented beta blocker date and time   Airway Mallampati: I TM Distance: >3 FB Neck ROM: full    Dental  (+) Teeth Intact and Dental Advisory Given   Pulmonary          Cardiovascular hypertension, Pt. on medications + Peripheral Vascular Disease Rhythm:regular Rate:Normal     Neuro/Psych    GI/Hepatic   Endo/Other    Renal/GU Renal disease     Musculoskeletal   Abdominal   Peds  Hematology   Anesthesia Other Findings   Reproductive/Obstetrics                         Anesthesia Physical Anesthesia Plan  ASA: III  Anesthesia Plan: General   Post-op Pain Management:    Induction: Intravenous  Airway Management Planned: Oral ETT  Additional Equipment:   Intra-op Plan:   Post-operative Plan: Extubation in OR  Informed Consent: I have reviewed the patients History and Physical, chart, labs and discussed the procedure including the risks, benefits and alternatives for the proposed anesthesia with the patient or authorized representative who has indicated his/her understanding and acceptance.   Dental advisory given  Plan Discussed with: Anesthesiologist, CRNA and Surgeon  Anesthesia Plan Comments:        Anesthesia Quick Evaluation

## 2012-06-02 NOTE — H&P (Signed)
Kyle Patel is an 67 y.o. male.   Chief Complaint: lbp HPI: lower back pain with radiation to both lower extremities which gets worse with walking for more than two years. No better with conservative treatment. Mri showed lumbar stenosis  Past Medical History  Diagnosis Date  . Hypertension   . Hypercholesteremia   . Pulmonary embolism 10/2011    Past Surgical History  Procedure Date  . Abdominal surgery     colon polyp removed 2007- sepsis, coma for 4 months per pt    History reviewed. No pertinent family history. Social History:  reports that he quit smoking about 8 years ago. His smoking use included Cigarettes. He quit after 20 years of use. He does not have any smokeless tobacco history on file. He reports that he does not drink alcohol or use illicit drugs.  Allergies:  Allergies  Allergen Reactions  . Levaquin (Levofloxacin) Itching    Medications Prior to Admission  Medication Sig Dispense Refill  . acetaminophen (TYLENOL) 500 MG tablet Take 1,000 mg by mouth daily as needed. For pain      . diltiazem (CARDIZEM CD) 120 MG 24 hr capsule Take 120 mg by mouth daily.      Marland Kitchen enoxaparin (LOVENOX) 80 MG/0.8ML injection Inject 0.8 mLs (80 mg total) into the skin every 12 (twelve) hours.  10 Syringe  0  . Multiple Vitamins-Minerals (CENTRUM PO) Take 1 tablet by mouth every morning.      . warfarin (COUMADIN) 4 MG tablet Take 4-8 mg by mouth See admin instructions. Take as directed per coumadin clinic Take 8mg  on Friday and 4mg  on all other days        Results for orders placed during the hospital encounter of 06/02/12 (from the past 48 hour(s))  APTT     Status: Normal   Collection Time   06/02/12  7:14 AM      Component Value Range Comment   aPTT 30  24 - 37 seconds   PROTIME-INR     Status: Normal   Collection Time   06/02/12  7:14 AM      Component Value Range Comment   Prothrombin Time 13.7  11.6 - 15.2 seconds    INR 1.06  0.00 - 1.49    No results found.  Review  of Systems  Constitutional: Negative.   HENT: Negative.   Eyes: Negative.   Respiratory: Negative.   Cardiovascular:       Arterial hypertension  Gastrointestinal: Negative.   Genitourinary: Negative.   Musculoskeletal: Positive for back pain.  Skin: Negative.   Neurological: Positive for sensory change and focal weakness.  Endo/Heme/Allergies: Negative.   Psychiatric/Behavioral: Negative.     Blood pressure 165/91, pulse 67, temperature 97.7 F (36.5 C), temperature source Oral, resp. rate 20, height 5' 9.5" (1.765 m), weight 85.1 kg (187 lb 9.8 oz), SpO2 99.00%. Physical Exam hent,nl. Neck, nl. Cv, nl. Lungs clear. Abdomen, soft. ExtremIties, nl.NEURO SLR POSITIVE AT 60 DEGREES. WEAKNESS OF DF BOTH FEET . Hyperesthesia both feet. Mri showed lumbar stenosis at l5s1  Assessment/Plan Patient to have decompression at the level of l5s1. Patient aware of risks and benefits  Chanette Demo M 06/02/2012, 8:48 AM

## 2012-06-02 NOTE — Anesthesia Postprocedure Evaluation (Signed)
  Anesthesia Post-op Note  Patient: Kyle Patel  Procedure(s) Performed: Procedure(s) (LRB) with comments: LUMBAR LAMINECTOMY/DECOMPRESSION MICRODISCECTOMY 1 LEVEL (Bilateral) - Bilateral Lumbar five-sacral one Foraminotomy and microdiskectomy   Patient Location: PACU  Anesthesia Type:General  Level of Consciousness: awake, oriented, sedated and patient cooperative  Airway and Oxygen Therapy: Patient Spontanous Breathing  Post-op Pain: mild  Post-op Assessment: Post-op Vital signs reviewed, Patient's Cardiovascular Status Stable, Respiratory Function Stable, Patent Airway, No signs of Nausea or vomiting and Pain level controlled  Post-op Vital Signs: stable  Complications: No apparent anesthesia complications

## 2012-06-02 NOTE — Progress Notes (Signed)
Op note [phone]

## 2012-06-02 NOTE — Preoperative (Signed)
Beta Blockers   Reason not to administer Beta Blockers:Not Applicable 

## 2012-06-02 NOTE — Progress Notes (Signed)
Patient arrived to 4N16. Bed alarm on and explained. Will continue to monitor.

## 2012-06-02 NOTE — Transfer of Care (Signed)
Immediate Anesthesia Transfer of Care Note  Patient: Kyle Patel  Procedure(s) Performed: Procedure(s) (LRB) with comments: LUMBAR LAMINECTOMY/DECOMPRESSION MICRODISCECTOMY 1 LEVEL (Bilateral) - Bilateral Lumbar five-sacral one Foraminotomy and microdiskectomy   Patient Location: PACU  Anesthesia Type:General  Level of Consciousness: sedated  Airway & Oxygen Therapy: Patient Spontanous Breathing and Patient connected to face mask oxygen  Post-op Assessment: Report given to PACU RN and Post -op Vital signs reviewed and stable  Post vital signs: Reviewed and stable  Complications: No apparent anesthesia complications

## 2012-06-03 ENCOUNTER — Encounter (HOSPITAL_COMMUNITY): Payer: Self-pay | Admitting: Neurosurgery

## 2012-06-03 NOTE — Progress Notes (Signed)
Patient ID: Kyle Patel, male   DOB: 03-Aug-1945, 67 y.o.   MRN: [id number] C/o some numbness left foot but no weakness. Minimal lbp , plan pt

## 2012-06-03 NOTE — Evaluation (Signed)
Occupational Therapy Evaluation Patient Details Name: Kyle Patel MRN: [id number] DOB: 06-21-1945 Today's Date: 06/03/2012 Time: 7829-5621 OT Time Calculation (min): 44 min  OT Assessment / Plan / Recommendation Clinical Impression  Pt admitted for lumbar laminectomy/decompression.  Pt is mobilizing with supervision.  Education in back precautions provided.  No further OT or DME needs.    OT Assessment  Patient does not need any further OT services    Follow Up Recommendations  No OT follow up;Supervision - Intermittent    Barriers to Discharge      Equipment Recommendations  None recommended by OT    Recommendations for Other Services    Frequency       Precautions / Restrictions Precautions Precautions: Back Precaution Booklet Issued: Yes (comment) Precaution Comments: precautions reviewed and practiced   Pertinent Vitals/Pain 5/10 post surgical, back, premedicated, repositioned    ADL  Eating/Feeding: Independent Where Assessed - Eating/Feeding: Edge of bed Grooming: Supervision/safety;Wash/dry hands Where Assessed - Grooming: Unsupported standing Upper Body Bathing: Set up Where Assessed - Upper Body Bathing: Unsupported sitting Lower Body Bathing: Supervision/safety Where Assessed - Lower Body Bathing: Unsupported sitting;Supported sit to stand Upper Body Dressing: Set up Where Assessed - Upper Body Dressing: Unsupported sitting Lower Body Dressing: Supervision/safety Where Assessed - Lower Body Dressing: Supported sit to stand;Unsupported sitting Toilet Transfer: Radiographer, therapeutic Method: Sit to Barista: Comfort height toilet;Grab bars Toileting - Architect and Hygiene: Modified independent Where Assessed - Engineer, mining and Hygiene: Sit to stand from 3-in-1 or toilet Equipment Used: Sock aid;Rolling walker;Reacher;Long-handled sponge;Long-handled shoe horn;Gait belt Transfers/Ambulation  Related to ADLs: supervision with RW ADL Comments: Pt instructed and practiced use of AE for LB ADL.  He will purchase a long sponge in the community.  Pt used grab bar to stand from comfort height toilet.  He was able to stand from the bed and has an elevated toilet at home, so no DME anticipated.    OT Diagnosis:    OT Problem List:   OT Treatment Interventions:     OT Goals    Visit Information  Last OT Received On: 06/03/12 Assistance Needed: +1 PT/OT Co-Evaluation/Treatment: Yes    Subjective Data  Subjective: "I have a reacher, sock aid, and shoe horn at home." Patient Stated Goal: Home with wife.   Prior Functioning     Home Living Lives With: Spouse Available Help at Discharge: Family Type of Home: House Home Access: Stairs to enter [employer] of Steps: 3 Entrance Stairs-Rails: None Home Layout: Two level;Able to live on main level with bedroom/bathroom;Bed/bath upstairs Alternate Level Stairs-Number of Steps: flight Alternate Level Stairs-Rails: Right Bathroom Shower/Tub: Heritage manager Toilet: Handicapped height Home Adaptive Equipment: Built-in shower seat;Straight cane;Reacher;Sock aid;Long-handled shoehorn Prior Function Level of Independence: Independent Able to Take Stairs?: Yes Driving: Yes Vocation: Retired Musician: No difficulties Dominant Hand: Right         Vision/Perception     Cognition  Overall Cognitive Status: Appears within functional limits for tasks assessed/performed Arousal/Alertness: Awake/alert Orientation Level: Oriented X4 / Intact;Appears intact for tasks assessed Behavior During Session: [facility] for tasks performed    Extremity/Trunk Assessment Right Upper Extremity Assessment RUE ROM/Strength/Tone: [facility] for tasks assessed RUE Coordination: WFL - gross/fine motor Left Upper Extremity Assessment LUE ROM/Strength/Tone: WFL for tasks assessed LUE Coordination: WFL - gross/fine  motor Right Lower Extremity Assessment RLE ROM/Strength/Tone: WFL for tasks assessed Left Lower Extremity Assessment LLE ROM/Strength/Tone: WFL for tasks assessed LLE Sensation: Deficits (numbness  in toes )     Mobility Bed Mobility Bed Mobility: Right Sidelying to Sit;Sit to Sidelying Right;Sitting - Scoot to Edge of Bed Right Sidelying to Sit: 5: Supervision;HOB flat Sitting - Scoot to Edge of Bed: 5: Supervision Sit to Sidelying Right: 5: Supervision Details for Bed Mobility Assistance: verbal cues for log roll technique Transfers Transfers: Sit to Stand;Stand to Sit Sit to Stand: 5: Supervision;From bed Stand to Sit: 5: Supervision;To bed Details for Transfer Assistance: patient steady with transfers (VC's for hand placement initially)     Shoulder Instructions     Exercise     Balance     End of Session OT - End of Session Activity Tolerance: Patient tolerated treatment well Patient left: in bed;with call bell/phone within reach  GO     Evern Bio 06/03/2012, 1:07 PM [phone]

## 2012-06-03 NOTE — Clinical Social Work Note (Signed)
Clinical Social Work   CSW reviewed chart. PT/OT are not recommending any follow up. CSW will update RNCM. CSW is signing off as no further needs are identified. Please reconsult if a need arises prior to discharge.   Dede Query, MSW, Theresia Majors [phone]

## 2012-06-03 NOTE — Evaluation (Signed)
Physical Therapy Evaluation Patient Details Name: Kyle Patel MRN: [id number] DOB: 08-10-1945 Today's Date: 06/03/2012 Time: 1137-1202 PT Time Calculation (min): 25 min  PT Assessment / Plan / Recommendation Clinical Impression  Pt is a pleasent 67 y.o. male s/p lumbar surgery.  Patient demonstrates minimal deficits in functional mobility secondary to pain, weakness and decreased activity tolerance.  Pt educated on energy conservation with rest breaks.  Pt safely negotiates stairs at supervision level and ambulates well. Will continue to see to address deficits and maximize independence for d/c home.    PT Assessment  Patient needs continued PT services    Follow Up Recommendations  No PT follow up    Does the patient have the potential to tolerate intense rehabilitation      Barriers to Discharge None      Equipment Recommendations  Rolling walker with 5" wheels    Recommendations for Other Services     Frequency Min 5X/week    Precautions / Restrictions Precautions Precautions: Back   Pertinent Vitals/Pain 5/10      Mobility  Transfers Transfers: Sit to Stand;Stand to Sit Sit to Stand: 5: Supervision;From bed Stand to Sit: 5: Supervision;To bed Details for Transfer Assistance: patient steady with transfers (VC's for hand placement initially) Ambulation/Gait Ambulation/Gait Assistance: 5: Supervision Ambulation Distance (Feet): 200 Feet Assistive device: Rolling walker Ambulation/Gait Assistance Details: patient steady with ambulation; educated on precautions while ambulating Gait Pattern: Step-through pattern;Decreased stride length (some stiffness evident in LLE; no toe contact with floor) Gait velocity: WFL General Gait Details: patient steady with ambulation Stairs: Yes Stairs Assistance: 4: Min guard Stair Management Technique: One rail Right;Backwards Number of Stairs: 12            PT Diagnosis: Difficulty walking;Acute pain  PT Problem List:  Decreased strength;Decreased activity tolerance;Decreased balance;Decreased mobility;Pain PT Treatment Interventions: DME instruction;Gait training;Stair training;Functional mobility training;Therapeutic exercise;Therapeutic activities;Patient/family education   PT Goals Acute Rehab PT Goals PT Goal Formulation: With patient Time For Goal Achievement: 06/10/12 Potential to Achieve Goals: Good Pt will go Sit to Stand: with modified independence PT Goal: Sit to Stand - Progress: Goal set today Pt will go Stand to Sit: with modified independence PT Goal: Stand to Sit - Progress: Goal set today Pt will Ambulate: >150 feet;with modified independence PT Goal: Ambulate - Progress: Goal set today Pt will Go Up / Down Stairs: Flight;with supervision PT Goal: Up/Down Stairs - Progress: Goal set today  Visit Information  Last PT Received On: 06/03/12 Assistance Needed: +1    Subjective Data  Subjective: When will the soreness go away Patient Stated Goal: to go home tomorrow   Prior Functioning  Home Living Lives With: Spouse Available Help at Discharge: Family Type of Home: House Home Access: Stairs to enter [employer] of Steps: 3 Entrance Stairs-Rails: None Home Layout: Two level;Able to live on main level with bedroom/bathroom;Bed/bath upstairs Alternate Level Stairs-Number of Steps: flight Alternate Level Stairs-Rails: Right Bathroom Shower/Tub: Health visitor: Handicapped height (comfort height) Home Adaptive Equipment: Built-in shower seat;Straight cane Prior Function Level of Independence: Independent Able to Take Stairs?: Yes Driving: Yes Vocation: Retired Musician: No difficulties Dominant Hand: Right    Cognition  Overall Cognitive Status: Appears within functional limits for tasks assessed/performed Arousal/Alertness: Awake/alert Orientation Level: Oriented X4 / Intact;Appears intact for tasks assessed Behavior During  Session: [facility] for tasks performed    Extremity/Trunk Assessment Right Upper Extremity Assessment RUE ROM/Strength/Tone: [facility] for tasks assessed Left Upper Extremity Assessment LUE ROM/Strength/Tone:  WFL for tasks assessed Right Lower Extremity Assessment RLE ROM/Strength/Tone: [facility] for tasks assessed Left Lower Extremity Assessment LLE ROM/Strength/Tone: WFL for tasks assessed LLE Sensation: Deficits (numbness in toes )   Balance    End of Session PT - End of Session Equipment Utilized During Treatment: Gait belt Activity Tolerance: Patient tolerated treatment well Patient left: in bed;with family/visitor present (seated EOB) Nurse Communication: Mobility status  GP     Fabio Asa 06/03/2012, 12:16 PM  Charlotte Crumb, PT DPT  [phone]

## 2012-06-03 NOTE — Op Note (Signed)
NAME:  SAATHVIK, EVERY NO.:  [id number]  MEDICAL RECORD NO.:  [id number]  LOCATION:  [id number]                        FACILITY:  [facility]  PHYSICIAN:  Hilda Lias, M.D.   DATE OF BIRTH:  July 05, 1945  DATE OF PROCEDURE:  06/02/2012 DATE OF DISCHARGE:                              OPERATIVE REPORT   PREOPERATIVE DIAGNOSIS:  L5-S1 stenosis with chronic radiculopathy, calcified herniated disk.  POSTOPERATIVE DIAGNOSIS:  L5-S1 stenosis with chronic radiculopathy, calcified herniated disk.  PROCEDURE:  Bilateral L5 laminectomy, bilateral L5-S1 foraminotomy, right L5-S1 diskectomy, decompression of the thecal sac, and video microscope.  SURGEON:  Hilda Lias, M.D.  ASSISTANT:  Dr. Yetta Barre.  CLINICAL HISTORY:  Mr. Neely is a gentleman, who had been complaining of low back pain worsened to both legs associated with a burning sensation for more than 2 years.  I will follow him.  He had an MRI, which shows stenosis at L5-S1 with a calcified disk.  Lately, he came to my office telling me that the pain was getting worse associated with burning sensation.  He wanted to proceed with surgery.  He knew the risk and benefit with the surgery, the possibility of infection, CSF leak, hematoma, and need of further surgery.  PROCEDURE IN DETAIL:  The patient was taken to the OR and he was positioned in a prone manner.  The skin was cleaned with Betadine and DuraPrep.  X-rays showed that we were at L5-1.  Then, drapes were applied.  Midline incision was made all the way down to the spinous process of L5 with retraction laterally to the facet.  The canal was vertical and we proceeded with removal of the spinous process of L5 as well as the lamina.  The patient had quite a bit of thickening of the yellow ligament.  The canal was quite narrow and forearm and decompression of the thecal sac was done.  Then, we retracted the thecal sac in the left side first and there was a calcified  disk.  We were unable to get into that one.  We retracted the right side and we were able to get into the disk space.  We did a total gross diskectomy from the right side going all the way to the left.  At the end, we have a good decompression of the thecal sac with plenty of space for the foramen of L5-S1.  Valsalva maneuver was negative.  Hemovac was left in the pleural space and the wound was closed with Vicryl and Steri-Strips.          ______________________________ Hilda Lias, M.D.     EB/MEDQ  D:  06/02/2012  T:  06/03/2012  Job:  [id number]

## 2012-06-03 NOTE — Clinical Social Work Note (Signed)
Clinical Social Work   CSW received consult for SNF. CSW reviewed chart and discussed pt with RN during progression. Awaiting PT/OT evals for discharge recommendations. CSW will assess for SNF, if appropriate. Please call with any urgent needs. CSW will continue to follow.   Dede Query, MSW, Theresia Majors [phone]

## 2012-06-04 MED ORDER — FLEET ENEMA 7-19 GM/118ML RE ENEM
1.0000 | ENEMA | Freq: Every day | RECTAL | Status: DC | PRN
  Administered 2012-06-04: 1 via RECTAL
  Filled 2012-06-04: qty 1

## 2012-06-04 NOTE — Progress Notes (Signed)
Physical Therapy Treatment Patient Details Name: Kyle Patel MRN: [id number] DOB: Jul 02, 1945 Today's Date: 06/04/2012 Time: 7846-9629 PT Time Calculation (min): 26 min  PT Assessment / Plan / Recommendation Comments on Treatment Session  Patient continues to demonstrate steady progress with PT goals.  Patient educated on proper technique for car transfers.  Discussed with patient and spouse at length expectations for mobility upon discharge.  Feel patient will be safe for d/c home with spouse.      Follow Up Recommendations  No PT follow up           Equipment Recommendations  Rolling walker with 5" wheels    Recommendations for Other Services    Frequency Min 5X/week   Plan      Precautions / Restrictions Precautions Precautions: Back Precaution Booklet Issued: Yes (comment) Precaution Comments: precautions reviewed and practiced   Pertinent Vitals/Pain 4/10    Mobility  Bed Mobility Bed Mobility: Right Sidelying to Sit;Sit to Sidelying Right;Sitting - Scoot to Edge of Bed Right Sidelying to Sit: 5: Supervision;HOB flat Sitting - Scoot to Edge of Bed: 5: Supervision Sit to Sidelying Right: 5: Supervision Details for Bed Mobility Assistance: Reviewed technique for bed mobility with precautions Transfers Transfers: Sit to Stand;Stand to Sit Sit to Stand: 5: Supervision;From bed Stand to Sit: 5: Supervision;To bed Details for Transfer Assistance: patient required minimal VC's for hand placement     PT Goals Acute Rehab PT Goals PT Goal: Sit to Stand - Progress: Progressing toward goal PT Goal: Stand to Sit - Progress: Progressing toward goal PT Goal: Ambulate - Progress: Progressing toward goal  Visit Information  Last PT Received On: 06/04/12 Assistance Needed: +1    Subjective Data  Subjective: I was able to go to the bathroom so I feel better Patient Stated Goal: to go home tomorrow   Cognition  Overall Cognitive Status: Appears within functional limits  for tasks assessed/performed Arousal/Alertness: Awake/alert Orientation Level: Oriented X4 / Intact;Appears intact for tasks assessed Behavior During Session: [facility] for tasks performed       End of Session PT - End of Session Equipment Utilized During Treatment: Gait belt Activity Tolerance: Patient tolerated treatment well Patient left: in bed;with family/visitor present (seated EOB to eat lunch) Nurse Communication: Mobility status   GP     Fabio Asa 06/04/2012, 12:34 PM Charlotte Crumb, PT DPT  [phone]

## 2012-06-04 NOTE — Progress Notes (Signed)
Patient ID: Kyle Patel, male   DOB: [birth year], 67 y.o.   MRN: [id number] Neuro better,decrease of tingling in both feet. gu ok. Abdomen distended. Flatus positive

## 2012-06-04 NOTE — Care Management Note (Signed)
    Page 1 of 1   06/04/2012     5:06:15 PM   CARE MANAGEMENT NOTE 06/04/2012  Patient:  Kyle Patel, Kyle Patel   Account Number:  [id number]  Date Initiated:  06/03/2012  Documentation initiated by:  [facility]  Subjective/Objective Assessment:   Admitted postop L5 laminectomy, rt L5-S1 diskectomy     Action/Plan:   PT/OT evals- no follow up recommended, recommended rolling walker   Anticipated DC Date:  06/05/2012   Anticipated DC Plan:  HOME/SELF CARE      DC Planning Services  CM consult      Choice offered to / List presented to:     DME arranged  Levan Hurst      DME agency  [facility]        Status of service:  Completed, signed off Medicare Important Message given?   (If response is "NO", the following Medicare IM given date fields will be blank) Date Medicare IM given:   Date Additional Medicare IM given:    Discharge Disposition:  HOME/SELF CARE  Per UR Regulation:  Reviewed for med. necessity/level of care/duration of stay  If discussed at Long Length of Stay Meetings, dates discussed:    Comments:

## 2012-06-04 NOTE — Progress Notes (Signed)
Pt dc instructions provided. Pt verbalized understanding.  IV dc intact. Pt under no s/s distress.

## 2012-06-04 NOTE — Progress Notes (Deleted)
Pt dc instructions provided. Pt daughter verbalized understanding. Pt iv dc intact. Pt under no s/s distress.

## 2012-06-04 NOTE — Discharge Summary (Signed)
Physician Discharge Summary  Patient ID: Kyle Patel MRN: [id number] DOB/AGE: 65-Sep-1947 65 y.o.  Admit date: 06/02/2012 Discharge date: 06/04/2012  Admission Diagnoses:l5s1 hnp  Discharge Diagnoses: same   Discharged Condition: no weakness  Hospital Course: surgery  Consults: none  Significant Diagnostic Studies: myelogram  Treatments: l5s1 discectomy  Discharge Exam: Blood pressure 140/74, pulse 99, temperature 98.9 F (37.2 C), temperature source Oral, resp. rate 18, height 5\' 9"  (1.753 m), weight 85.2 kg (187 lb 13.3 oz), SpO2 99.00%. ambulating Disposition: 01-Home or Self Care  Discharge Orders    Future Appointments: Provider: Department: Dept Phone: Center:   06/08/2012 3:00 PM [facility] [phone] [facility]       Medication List     As of 06/04/2012  2:32 PM    ASK your doctor about these medications         acetaminophen 500 MG tablet   Commonly known as: TYLENOL   Take 1,000 mg by mouth daily as needed. For pain      CENTRUM PO   Take 1 tablet by mouth every morning.      diltiazem 120 MG 24 hr capsule   Commonly known as: CARDIZEM CD   Take 120 mg by mouth daily.      enoxaparin 80 MG/0.8ML injection   Commonly known as: LOVENOX   Inject 0.8 mLs (80 mg total) into the skin every 12 (twelve) hours.      warfarin 4 MG tablet   Commonly known as: COUMADIN   Take 4-8 mg by mouth See admin instructions. Take as directed per coumadin clinic  Take 8mg  on Friday and 4mg  on all other days         Signed: Karn Cassis 06/04/2012, 2:32 PM

## 2012-06-08 ENCOUNTER — Telehealth: Payer: Self-pay | Admitting: Pulmonary Disease

## 2012-06-08 ENCOUNTER — Ambulatory Visit (INDEPENDENT_AMBULATORY_CARE_PROVIDER_SITE_OTHER): Payer: 59 | Admitting: General Practice

## 2012-06-08 DIAGNOSIS — Z7901 Long term (current) use of anticoagulants: Secondary | ICD-10-CM

## 2012-06-08 DIAGNOSIS — I2699 Other pulmonary embolism without acute cor pulmonale: Secondary | ICD-10-CM

## 2012-06-08 DIAGNOSIS — I824Y9 Acute embolism and thrombosis of unspecified deep veins of unspecified proximal lower extremity: Secondary | ICD-10-CM

## 2012-06-08 NOTE — Telephone Encounter (Signed)
Needs lovenox bridge until coumadin therapeutic

## 2012-06-08 NOTE — Telephone Encounter (Signed)
I spoke with Arline Asp. She stated there was some confusion at d/c when pt had his surgery he thought he was told he could just take low dose aspirin and did not have to restart the coumadin and lovenox. Pt has not had any coumadin since 05/28/12. Arline Asp advised pt to take lovenox injection when he got home this afternoon and one injection in the morning. She would give him a call with concrete instructions tomorrow on how to proceed. Arline Asp is just needing the okay to restart the coumadin and the lovenox from [facility]. She is fine with a call back in the AM. Will also hold in my box to f/u on. Please advise Dr. [name] thanks

## 2012-06-09 ENCOUNTER — Telehealth: Payer: Self-pay | Admitting: General Practice

## 2012-06-09 NOTE — Telephone Encounter (Signed)
Please resume taking/Per Dr. [name]. 1/15 - Take coumadin 8 mg and Lovenox AM and PM 1/16 - Take coumadin 8 mg and Lovenox AM and PM 1/17 - Take coumadin 4 mg and Lovenox AM and PM 1/18 - Take coumadin 4 mg and Lovenox AM and PM 1/19 - Take coumadin 4 mg and Lovenox AM and PM 1/20 - Take coumadin 4 mg and Lovenox AM and PM 1/21 - Re-check in coumadin clinic ( Do not take coumadin or Lovenox before visit.

## 2012-06-09 NOTE — Telephone Encounter (Signed)
I spoke with Kyle Patel. She is aware. Nothing further was needed

## 2012-06-11 ENCOUNTER — Other Ambulatory Visit: Payer: Self-pay | Admitting: General Practice

## 2012-06-11 ENCOUNTER — Telehealth: Payer: Self-pay | Admitting: General Practice

## 2012-06-11 MED ORDER — ENOXAPARIN SODIUM 80 MG/0.8ML ~~LOC~~ SOLN: 80.0000 mg | Freq: Two times a day (BID) | SUBCUTANEOUS | Status: DC

## 2012-06-11 NOTE — Telephone Encounter (Signed)
Called pt to let him know that lovenox has been called to pharmacy.

## 2012-06-15 ENCOUNTER — Ambulatory Visit (INDEPENDENT_AMBULATORY_CARE_PROVIDER_SITE_OTHER): Payer: 59 | Admitting: General Practice

## 2012-06-15 DIAGNOSIS — Z7901 Long term (current) use of anticoagulants: Secondary | ICD-10-CM

## 2012-06-15 DIAGNOSIS — I824Y9 Acute embolism and thrombosis of unspecified deep veins of unspecified proximal lower extremity: Secondary | ICD-10-CM

## 2012-06-15 DIAGNOSIS — I2699 Other pulmonary embolism without acute cor pulmonale: Secondary | ICD-10-CM

## 2012-07-13 ENCOUNTER — Ambulatory Visit (INDEPENDENT_AMBULATORY_CARE_PROVIDER_SITE_OTHER): Payer: 59 | Admitting: General Practice

## 2012-07-13 DIAGNOSIS — Z7901 Long term (current) use of anticoagulants: Secondary | ICD-10-CM

## 2012-07-13 DIAGNOSIS — I2699 Other pulmonary embolism without acute cor pulmonale: Secondary | ICD-10-CM

## 2012-07-13 DIAGNOSIS — I824Y9 Acute embolism and thrombosis of unspecified deep veins of unspecified proximal lower extremity: Secondary | ICD-10-CM

## 2012-07-13 LAB — POCT INR: INR: 2.9

## 2012-07-21 ENCOUNTER — Ambulatory Visit (INDEPENDENT_AMBULATORY_CARE_PROVIDER_SITE_OTHER): Payer: 59

## 2012-07-21 ENCOUNTER — Encounter: Payer: Self-pay | Admitting: Orthopedic Surgery

## 2012-07-21 ENCOUNTER — Ambulatory Visit (INDEPENDENT_AMBULATORY_CARE_PROVIDER_SITE_OTHER): Payer: 59 | Admitting: Orthopedic Surgery

## 2012-07-21 VITALS — BP 140/78 | Ht 69.5 in | Wt 194.0 lb

## 2012-07-21 DIAGNOSIS — M25511 Pain in right shoulder: Secondary | ICD-10-CM

## 2012-07-21 DIAGNOSIS — M25512 Pain in left shoulder: Secondary | ICD-10-CM

## 2012-07-21 DIAGNOSIS — M25519 Pain in unspecified shoulder: Secondary | ICD-10-CM

## 2012-07-21 NOTE — Patient Instructions (Addendum)
activities as tolerated 

## 2012-07-21 NOTE — Progress Notes (Signed)
Patient ID: Kyle Crabbe., male   DOB: 1946-01-22, 67 y.o.   MRN: [id number] Chief Complaint  Patient presents with  . 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 He's had some issues in his hips before according to the MRI is seen related to that but is not having any symptoms at this time.   Review of systems he has some numbness and tingling related to his recent back surgery some joint pain and stiffness and muscle pain but overall no other symptoms.  Expanded exam lateral shoulder evaluation  BP 140/78  Ht 5' 9.5" (1.765 m)  Wt 194 lb (87.998 kg)  BMI 28.25 kg/m2 He is awake alert and oriented x3 his mood and affect are normal he has over 125 of forward elevation bilaterally left about 1:30 right about 160. No pain no tenderness no instability sensation normal both arms good pulses in both arms no lymphadenopathy  Plain films of both shoulders show some irregularity in the humeral head but the joint is relatively preserved there are mild inferior osteophytes on both sides the joint but nothing major.  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 Asymptomatic. Follow if becomes symptomatic with MRIs.  Pain in joint, shoulder region, left - Plan: DG Shoulder Left  Pain in joint, shoulder region, right - Plan: DG Shoulder Right

## 2012-08-06 ENCOUNTER — Encounter: Payer: Self-pay | Admitting: Pulmonary Disease

## 2012-08-06 ENCOUNTER — Ambulatory Visit (INDEPENDENT_AMBULATORY_CARE_PROVIDER_SITE_OTHER): Payer: 59 | Admitting: Pulmonary Disease

## 2012-08-06 VITALS — BP 142/80 | HR 100 | Temp 97.8°F | Ht 69.5 in | Wt 196.8 lb

## 2012-08-06 NOTE — Patient Instructions (Addendum)
Stop taking coumadin around June 15th Take Aspirin 81 mg instead Return around mid- July with blood work- D-dimer, hypercoagulable panel Recheck your lipid panel with dr Juanetta Gosling to decide about vytorin

## 2012-08-06 NOTE — Progress Notes (Signed)
  Subjective:    Patient ID: Kyle Patel., male    DOB: 1946-03-11, 67 y.o.   MRN: [id number]  HPI PCP - Juanetta Gosling   67 yo remote ex smoker, seen for initial pulmonary admission 11/10/11 with Massive PE and LLE DVT  tx w/ coumadin . Hypercoagulable panel neg.  Echo showed severe pulmonary HTN, RV mildly dilated. Severe Pulm HTN w/ PAP 76  INR 3.9 on 6/28 , has follow up [facility] this [facility].  Hypercoagulable panel was neg  Pulmonary HTN ? Etiology , no known hx prior to admit, autoimmune workup w/ borderline RF, elevated CRP and ESR , neg ANA . ACE nml .  Mother and sister with hx of clots.  No recent surgery , prolonged immobiity, known injury or extended air/car travel.  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 No hemoptysis .  Had echo 04/29/12 showed sign improvement w/ Nml EF , PAP pressures return to nml, RV nml size.  Underwent  back surgery (lumbar laminectomy w/ decompression ) on 06/03/11 , w/  Lovonex bridge     Review of Systems  neg for any significant sore throat, dysphagia, itching, sneezing, nasal congestion or excess/ purulent secretions, fever, chills, sweats, unintended wt loss, pleuritic or exertional cp, hempoptysis, orthopnea pnd or change in chronic leg swelling. Also denies presyncope, palpitations, heartburn, abdominal pain, nausea, vomiting, diarrhea or change in bowel or urinary habits, dysuria,hematuria, rash, arthralgias, visual complaints, headache, numbness weakness or ataxia.     Objective:   Physical Exam  Gen. Pleasant, well-nourished, in no distress ENT - no lesions, no post nasal drip Neck: No JVD, no thyromegaly, no carotid bruits Lungs: no use of accessory muscles, no dullness to percussion, clear without rales or rhonchi  Cardiovascular: Rhythm regular, heart sounds  normal, no murmurs or gallops, no peripheral edema Musculoskeletal: No deformities, no cyanosis or  clubbing        Assessment & Plan:

## 2012-08-09 NOTE — Assessment & Plan Note (Signed)
CT chest 11/10/11 >large PE ,  DVT on doppler LLE  Echo mild RV dilation, severe P. HTN, PAP 76 >>rpt Echo 04/29/12 >nml ef, nml RA/ RV , PAP return to nml  autoimmune minimally elevated RF, ESR/CRP , neg ANA ., ACE nml  FH of clots in mother and sister  Hypercoagulable panel neg.  PFTs 8/13 no obstruction, DLCO 55%  Would ct coumadin until 6/14 - then stop , start on ASA daily & rechk d-dimer after 1 mnth

## 2012-08-13 ENCOUNTER — Ambulatory Visit (INDEPENDENT_AMBULATORY_CARE_PROVIDER_SITE_OTHER): Payer: 59 | Admitting: General Practice

## 2012-08-13 DIAGNOSIS — I2699 Other pulmonary embolism without acute cor pulmonale: Secondary | ICD-10-CM

## 2012-08-13 DIAGNOSIS — Z7901 Long term (current) use of anticoagulants: Secondary | ICD-10-CM

## 2012-09-10 IMAGING — US US RENAL
1 series · 14 of 25 positions shown · non-contrast
Comparison: CT abdomen pelvis without contrast 11/11/2011 and
earlier.

CLINICAL DATA: 66-year-old male with decreased renal function.

RENAL/URINARY TRACT ULTRASOUND COMPLETE

[Series 1: us renal · 0.30mm/px · 14 of 32 slices shown]
[im 1/32]
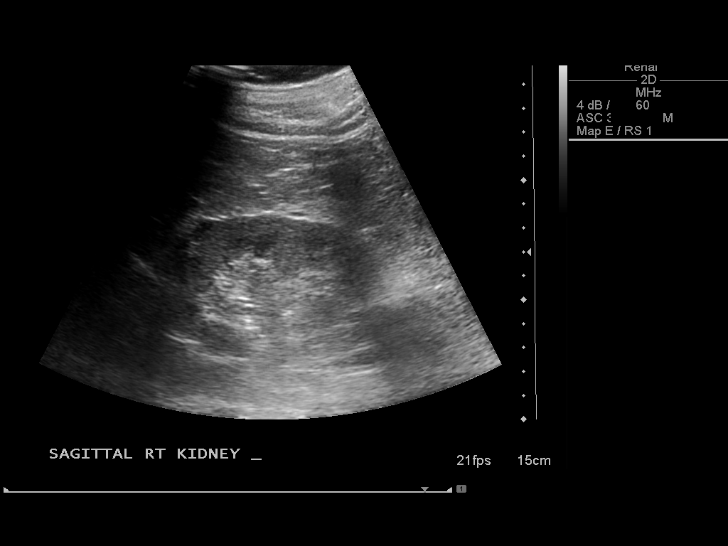
[im 3/32]
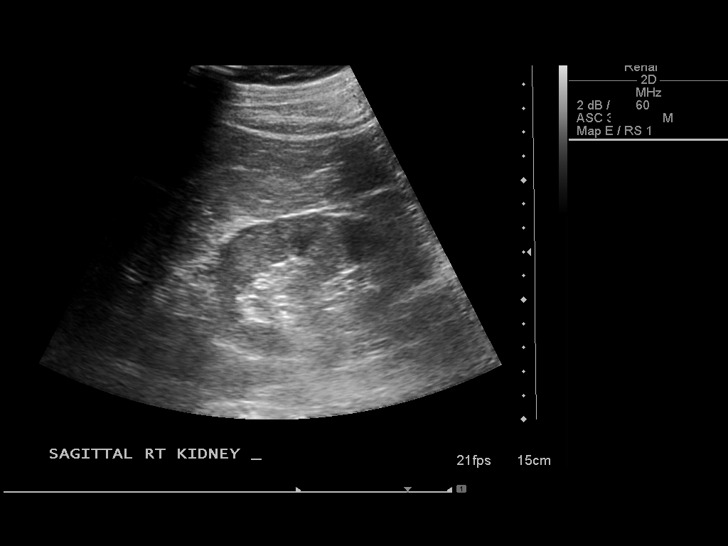
[im 6/32]
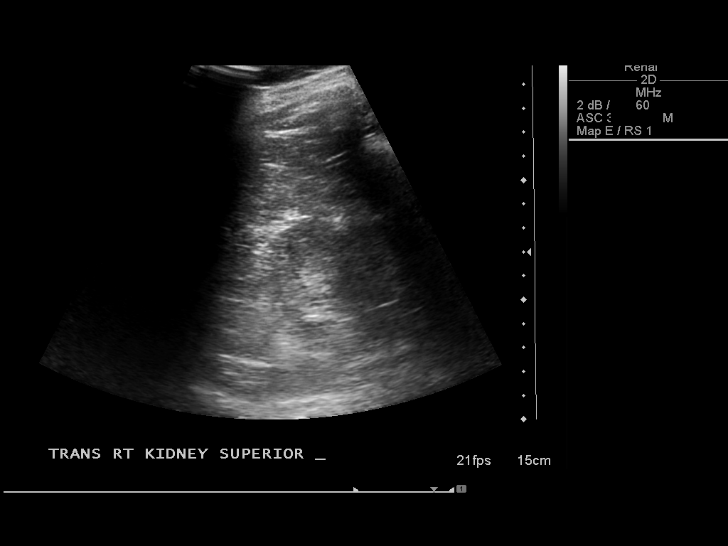
[im 8/32]
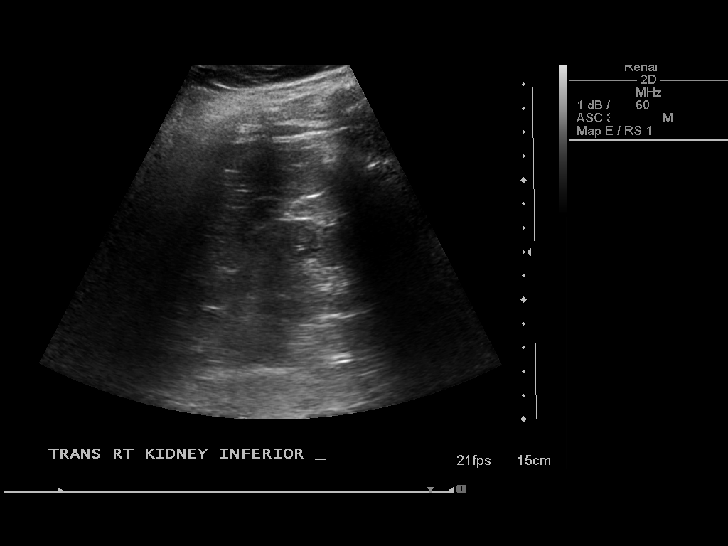
[im 11/32]
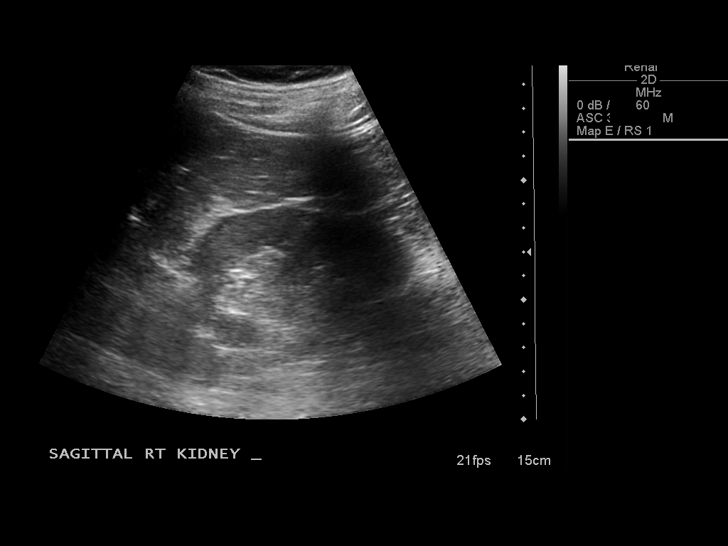
[im 12/32]
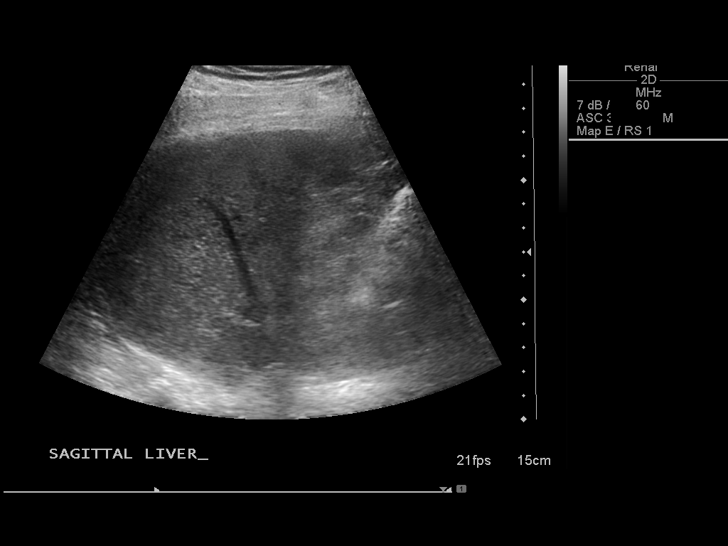
[im 15/32]
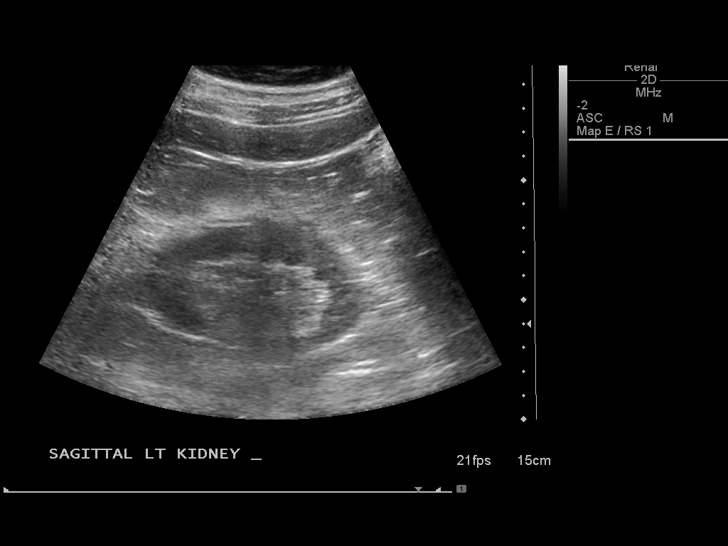
[im 17/32]
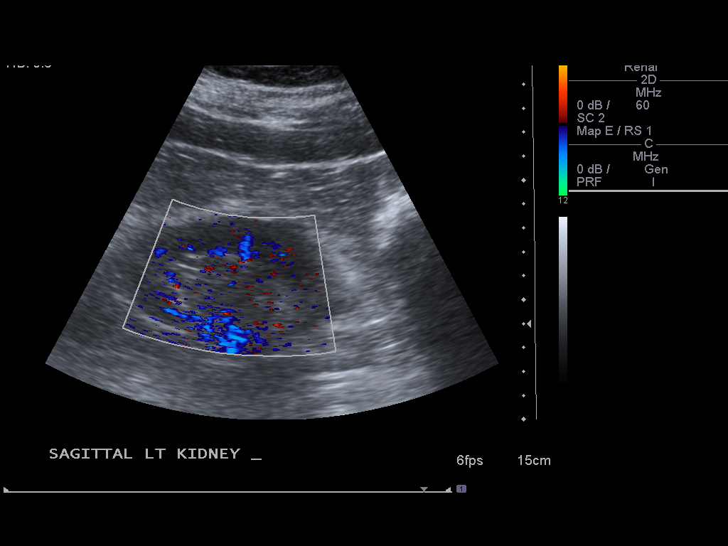
[im 20/32]
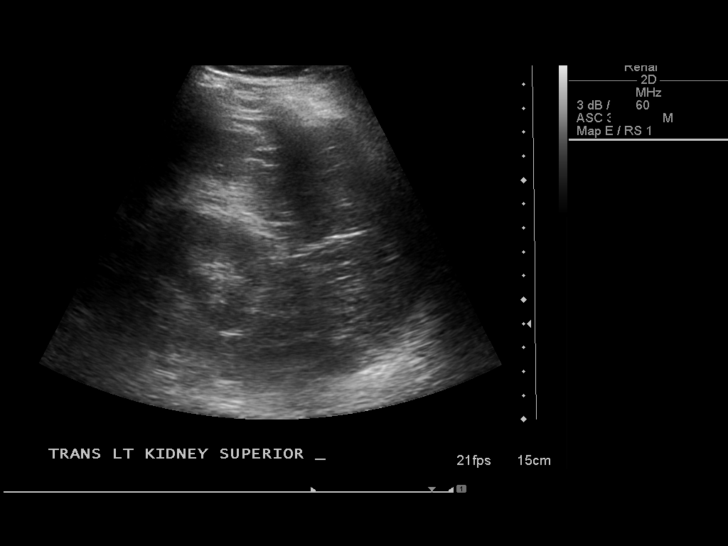
[im 21/32]
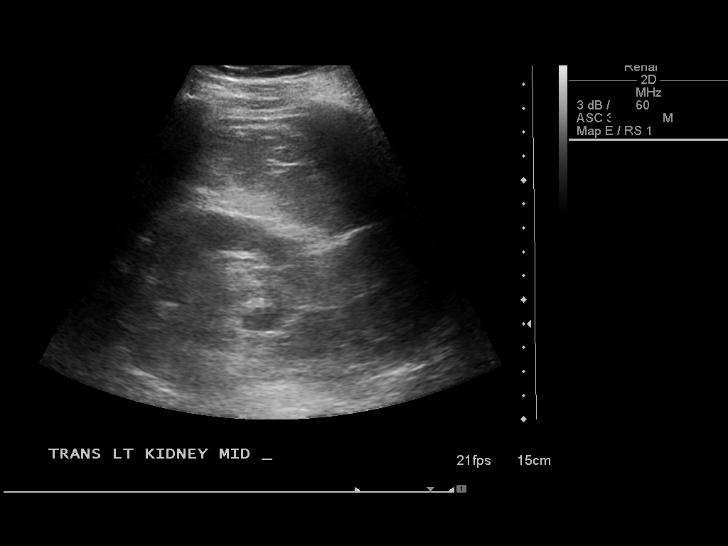
[im 24/32]
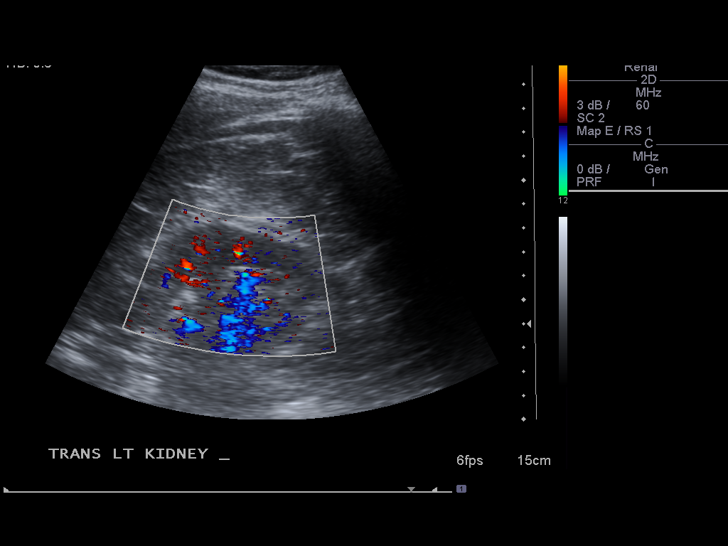
[im 26/32]
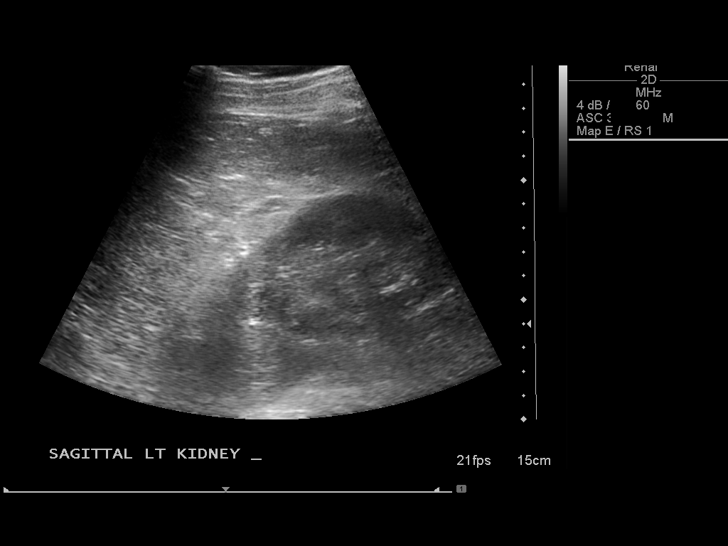
[im 29/32]
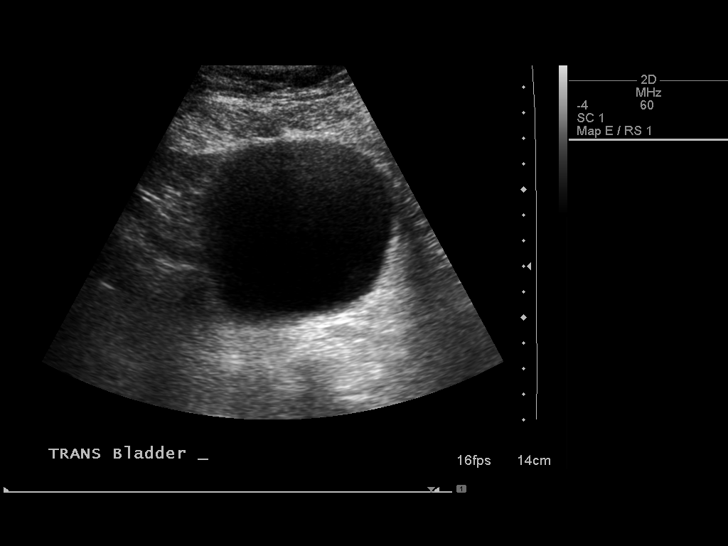
[im 32/32]
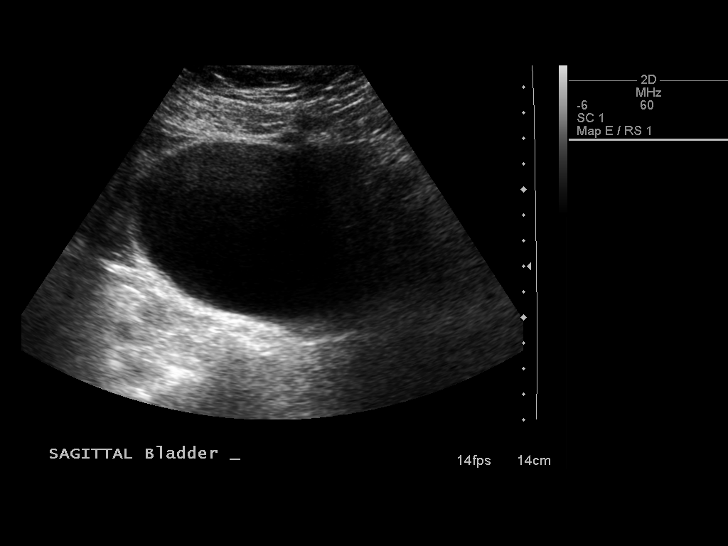

[14 of 25 positions shown; findings below may reference images not displayed]

FINDINGS: Right Kidney:  No hydronephrosis.  Renal length 10.2 cm.  Evidence
of trace perinephric fluid (image 5).  Cortical echotexture at the
upper limits of normal.

Left Kidney:  No hydronephrosis.  Renal length 10.7 cm.  Cortical
echotexture at the upper limits of normal.

Bladder:  Unremarkable.
IMPRESSION: No obstructive uropathy.  Evidence of trace pararenal fluid on the
right, nonspecific but may be related to acute intrinsic renal
disease (ATN, etc).

## 2012-09-24 ENCOUNTER — Ambulatory Visit (INDEPENDENT_AMBULATORY_CARE_PROVIDER_SITE_OTHER): Payer: Self-pay | Admitting: General Practice

## 2012-09-24 DIAGNOSIS — Z7901 Long term (current) use of anticoagulants: Secondary | ICD-10-CM

## 2012-09-24 DIAGNOSIS — I2699 Other pulmonary embolism without acute cor pulmonale: Secondary | ICD-10-CM

## 2012-12-10 ENCOUNTER — Ambulatory Visit (INDEPENDENT_AMBULATORY_CARE_PROVIDER_SITE_OTHER): Payer: Medicare Other | Admitting: Pulmonary Disease

## 2012-12-10 ENCOUNTER — Other Ambulatory Visit: Payer: Medicare Other

## 2012-12-10 ENCOUNTER — Encounter: Payer: Self-pay | Admitting: Pulmonary Disease

## 2012-12-10 VITALS — BP 144/70 | HR 80 | Temp 97.3°F | Ht 69.5 in | Wt 190.2 lb

## 2012-12-10 DIAGNOSIS — I2699 Other pulmonary embolism without acute cor pulmonale: Secondary | ICD-10-CM

## 2012-12-10 NOTE — Progress Notes (Signed)
  Subjective:    Patient ID: Kyle Patel., male    DOB: January 28, 1946, 67 y.o.   MRN: [id number]  HPI PCP - Juanetta Gosling   67 yo remote ex smoker, seen for initial pulmonary admission 11/10/11 with Massive PE and LLE DVT  tx w/ coumadin . Hypercoagulable panel neg.  Echo showed severe pulmonary HTN, RV mildly dilated. Severe Pulm HTN w/ PAP 76  INR 3.9 on 6/28 , has follow up [facility] this [facility].  Hypercoagulable panel was neg  Pulmonary HTN ? Etiology , no known hx prior to admit, autoimmune workup w/ borderline RF, elevated CRP and ESR , neg ANA . ACE nml .  Mother and sister with hx of clots.  No recent surgery , prolonged immobiity, known injury or extended air/car travel.  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  Rpt echo 04/29/12 showed sign improvement w/ Nml EF , PAP pressures return to nml, RV nml size.  Underwent back surgery (lumbar laminectomy w/ decompression ) on 06/03/11 , w/ Lovonex bridge    12/10/2012  Pt reports his breathing has been okay. Denies any cough, no wheezing, no chest tx. Pt has no new complaints today He stopped coumadin in June - has been taking ASA as advised Review of Systems neg for any significant sore throat, dysphagia, itching, sneezing, nasal congestion or excess/ purulent secretions, fever, chills, sweats, unintended wt loss, pleuritic or exertional cp, hempoptysis, orthopnea pnd or change in chronic leg swelling. Also denies presyncope, palpitations, heartburn, abdominal pain, nausea, vomiting, diarrhea or change in bowel or urinary habits, dysuria,hematuria, rash, arthralgias, visual complaints, headache, numbness weakness or ataxia.     Objective:   Physical Exam  Gen. Pleasant, well-nourished, in no distress ENT - no lesions, no post nasal drip Neck: No JVD, no thyromegaly, no carotid bruits Lungs: no use of accessory muscles, no dullness to percussion, clear without rales or rhonchi  Cardiovascular: Rhythm regular, heart sounds  normal,  no murmurs or gallops, no peripheral edema Musculoskeletal: No deformities, no cyanosis or clubbing        Assessment & Plan:

## 2012-12-10 NOTE — Patient Instructions (Addendum)
D-dimer test today If low, you can stay on ASA 81 mg daily

## 2012-12-10 NOTE — Assessment & Plan Note (Addendum)
He has completed 12 months of coumadin D-dimer test today If low, you can stay on ASA 81 mg daily If high, can consider novel agent such as apixaban He will continue fu with PCP & call us as needed

## 2013-04-01 IMAGING — CR DG LUMBAR SPINE 2-3V
1 series · 1 of 1 positions shown · non-contrast
Comparison: MRI 02/05/2012.

CLINICAL DATA: Intraoperative localization for spine surgery.

LUMBAR SPINE - 2-3 VIEW

[view not recorded]
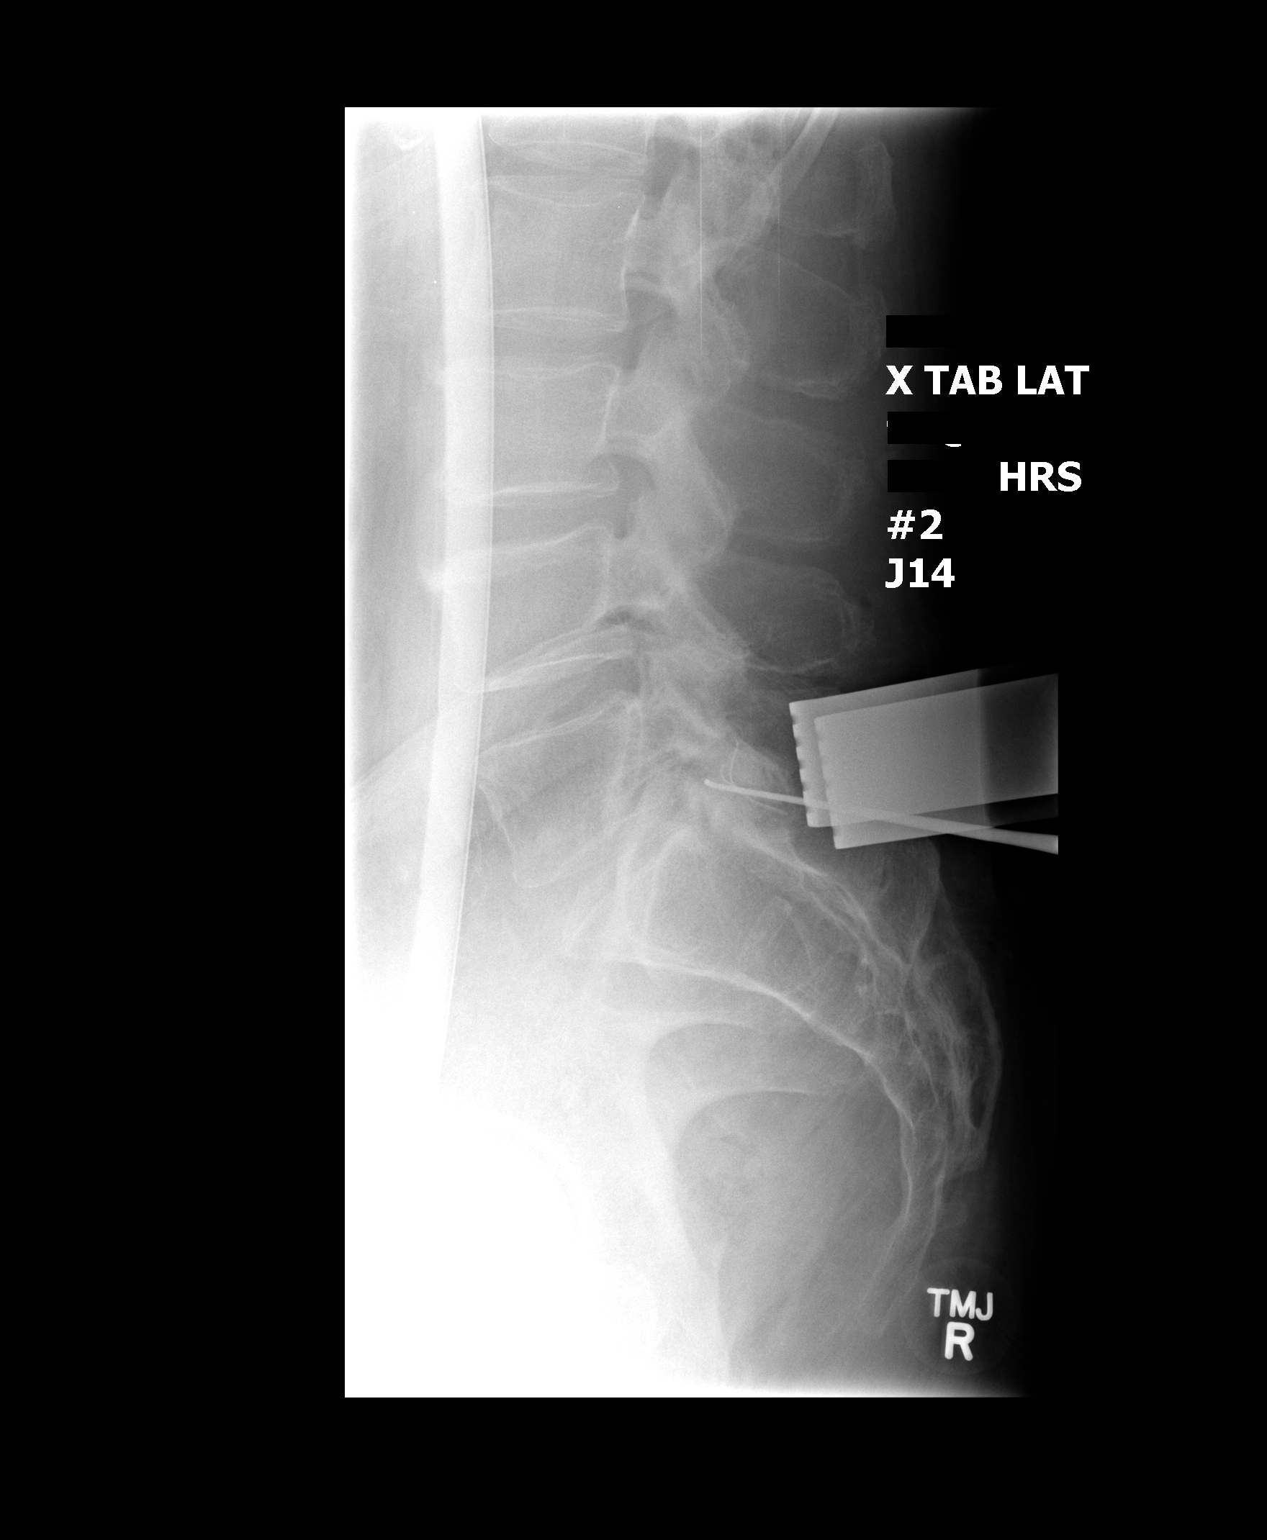

[1 of 1 positions shown; findings below may reference images not displayed]

FINDINGS: Film labeled #1 demonstrates a spinal needle marking the
L5 spinous process.

Film labeled #2 demonstrates a surgical instrument marking the L5-
S1 disc space.
IMPRESSION: L5-S1 marked intraoperatively.

## 2013-11-21 DIAGNOSIS — M5416 Radiculopathy, lumbar region: Secondary | ICD-10-CM | POA: Insufficient documentation

## 2014-04-12 ENCOUNTER — Ambulatory Visit (INDEPENDENT_AMBULATORY_CARE_PROVIDER_SITE_OTHER): Payer: Medicare Other | Admitting: Neurology

## 2014-04-12 ENCOUNTER — Encounter: Payer: Self-pay | Admitting: Neurology

## 2014-04-12 ENCOUNTER — Ambulatory Visit (INDEPENDENT_AMBULATORY_CARE_PROVIDER_SITE_OTHER): Payer: Self-pay

## 2014-04-12 DIAGNOSIS — Z0289 Encounter for other administrative examinations: Secondary | ICD-10-CM

## 2014-04-12 DIAGNOSIS — M545 Low back pain, unspecified: Secondary | ICD-10-CM

## 2014-04-12 DIAGNOSIS — R208 Other disturbances of skin sensation: Secondary | ICD-10-CM

## 2014-04-12 DIAGNOSIS — R200 Anesthesia of skin: Secondary | ICD-10-CM

## 2014-04-12 DIAGNOSIS — G8929 Other chronic pain: Secondary | ICD-10-CM

## 2014-04-12 DIAGNOSIS — R202 Paresthesia of skin: Secondary | ICD-10-CM

## 2014-04-12 NOTE — Procedures (Signed)
     HISTORY:  Kyle Patel is a 68 year old gentleman with a two-year history of numbness in the bottom of his feet. The patient has had lumbosacral spine surgery done, and the numbness in the feet has persisted, and has progressed slightly over time. He also complains of back pain without radiation down the legs. He is being evaluated for these symptoms.  NERVE CONDUCTION STUDIES:  Nerve conduction studies were performed on both lower extremities. The studies of peroneal nerves to the EDB muscles bilaterally were unobtainable. Study to the tibialis anterior muscles bilaterally revealed normal latencies and motor amplitudes and nerve conduction velocities. The distal motor latencies and motor amplitudes for the posterior tibial nerves are normal bilaterally. The nerve conduction velocities for these nerves are normal bilaterally. The H reflex latencies are prolonged bilaterally. The sensory latencies for the peroneal nerves are normal bilaterally.  EMG STUDIES:  EMG study was performed on the left lower extremity:  The tibialis anterior muscle reveals 2 to 6K motor units with moderately decreased recruitment. No fibrillations or positive waves were seen. The peroneus tertius muscle reveals 2 to 5K motor units with decreased recruitment. No fibrillations or positive waves were seen. The medial gastrocnemius muscle reveals 1 to 3K motor units with full recruitment. No fibrillations or positive waves were seen. The vastus lateralis muscle reveals 2 to 4K motor units with full recruitment. No fibrillations or positive waves were seen. The iliopsoas muscle reveals 2 to 4K motor units with full recruitment. No fibrillations or positive waves were seen. The biceps femoris muscle (long head) reveals 2 to 5K motor units with decreased recruitment. No fibrillations or positive waves were seen. The lumbosacral paraspinal muscles were tested at 3 levels, and revealed no abnormalities of insertional activity  at all 3 levels tested. There was good relaxation.  EMG study was performed on the right lower extremity:  The tibialis anterior muscle reveals 2 to 4K motor units with full recruitment. No fibrillations or positive waves were seen. The peroneus tertius muscle reveals 2 to 4K motor units with full recruitment. No fibrillations or positive waves were seen. The medial gastrocnemius muscle reveals 1 to 3K motor units with full recruitment. No fibrillations or positive waves were seen. The vastus lateralis muscle reveals 2 to 4K motor units with full recruitment. No fibrillations or positive waves were seen. The iliopsoas muscle reveals 2 to 4K motor units with full recruitment. No fibrillations or positive waves were seen. The biceps femoris muscle (long head) reveals 2 to 5K motor units with full recruitment. No fibrillations or positive waves were seen. The lumbosacral paraspinal muscles were tested at 3 levels, and revealed no abnormalities of insertional activity at all 3 levels tested. There was good relaxation.   IMPRESSION:  Nerve conduction studies done on both lower extremities shows evidence of distal dysfunction of the peroneal nerves bilaterally. There is no clear evidence of an overlying peripheral neuropathy, however an early peripheral neuropathy cannot be excluded. EMG evaluation of the left lower extremity shows findings most consistent with a chronic stable L5 radiculopathy. EMG evaluation of the right lower extremity was essentially normal.  Jill Alexanders MD 04/12/2014 2:01 PM  [facility] [address] [place], [place] [address]  Phone [phone] Fax [phone]

## 2014-04-18 ENCOUNTER — Encounter: Payer: Self-pay | Admitting: Neurology

## 2014-05-26 DIAGNOSIS — G609 Hereditary and idiopathic neuropathy, unspecified: Secondary | ICD-10-CM

## 2014-05-26 HISTORY — DX: Hereditary and idiopathic neuropathy, unspecified: G60.9

## 2014-05-31 ENCOUNTER — Encounter: Payer: Self-pay | Admitting: Neurology

## 2014-05-31 ENCOUNTER — Ambulatory Visit (INDEPENDENT_AMBULATORY_CARE_PROVIDER_SITE_OTHER): Payer: 59 | Admitting: Neurology

## 2014-05-31 VITALS — BP 155/79 | HR 89 | Ht 69.0 in | Wt 197.4 lb

## 2014-05-31 DIAGNOSIS — E538 Deficiency of other specified B group vitamins: Secondary | ICD-10-CM

## 2014-05-31 DIAGNOSIS — G609 Hereditary and idiopathic neuropathy, unspecified: Secondary | ICD-10-CM | POA: Insufficient documentation

## 2014-05-31 MED ORDER — DULOXETINE HCL 30 MG PO CPEP: ORAL_CAPSULE | ORAL | Status: DC

## 2014-05-31 NOTE — Progress Notes (Signed)
Reason for visit: Peripheral neuropathy  Kyle Kluth. is a 69 y.o. male  History of present illness:  Kyle Patel is a 69 year old right-handed black male with a history of discomfort in his feet that dates back about 4 years. The patient has had prior lumbosacral spine surgery around 2012 he believes, and he was having only back pain at that point, not pain radiating down the legs. He has had ongoing back pain since surgery, and his feet have become numb and tingling, with cold sensations and burning sensations in the feet and toes. Most of his symptoms are in the feet, not up the legs, and the left foot is worse than the right. The pain and discomfort is worse at nighttime when he is trying to rest and sleep, and he indicates that this does impair his ability to rest. The patient denies any significant balance issues or problems with falling. He denies any neck pain or pain down arms or problems with the hands. He indicates that his mother had diabetes, but he has been tested several times for diabetes and he does not have problems with his blood sugar. He has been given a trial on gabapentin which was not effective, and he took amitriptyline, but he could not tolerate this secondary to muscle cramping. He has had nerve conduction studies that did not show definite neuropathy, but a small fiber neuropathy was possible. He indicates that a recent MRI of the lumbar spine was done by Dr. Joya Salm, and he was told that there was no reason for his back for his foot pain on the scan. The results of the MRI are not available to me.  Past Medical History  Diagnosis Date  . Hypertension   . Hypercholesteremia   . Pulmonary embolism 10/2011  . Arthritis   . AVN (avascular necrosis of bone)     hips  . COPD (chronic obstructive pulmonary disease)   . 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 Abdominal surgery      colon polyp  removed 2007- sepsis, coma for 4 months per pt  . Lumbar laminectomy  06/02/2012  . Lumbar laminectomy/decompression microdiscectomy  06/02/2012    Procedure: LUMBAR LAMINECTOMY/DECOMPRESSION MICRODISCECTOMY 1 LEVEL;  Surgeon: Floyce Stakes, MD;  Location: [facility];  Service: Neurosurgery;  Laterality: Bilateral;  Bilateral Lumbar five-sacral one Foraminotomy and microdiskectomy     Family History  Problem Relation Age of Onset  . Arthritis    . Heart attack Mother   . Diabetes Mother   . Stroke Father   . Neuropathy Neg Hx     Social history:  reports that he quit smoking about 10 years ago. His smoking use included Cigarettes. He has a 2 pack-year smoking history. He has never used smokeless tobacco. He reports that he does not drink alcohol or use illicit drugs.  Medications:  Current Outpatient Prescriptions on File Prior to Visit  Medication Sig Dispense Refill  . diltiazem (CARDIZEM CD) 120 MG 24 hr capsule Take 120 mg by mouth daily.    . Multiple Vitamins-Minerals (CENTRUM PO) Take 1 tablet by mouth every morning.     No current facility-administered medications on file prior to visit.      Allergies  Allergen Reactions  . Levaquin [Levofloxacin] Itching    ROS:  Out of a complete 14 system review of symptoms, the patient complains only of the following symptoms, and all  other reviewed systems are negative.  Weight gain Joint pain, muscle cramps Numbness in the feet Not enough sleep Daytime sleepiness  Blood pressure 155/79, pulse 89, height 5\' 9"  (1.753 m), weight 197 lb 6.4 oz (89.54 kg).  Physical Exam  General: The patient is alert and cooperative at the time of the examination. The patient is moderately obese.  Eyes: Pupils are equal, round, and reactive to light. Discs are flat bilaterally.  Neck: The neck is supple, no carotid bruits are noted.  Respiratory: The respiratory examination is clear.  Cardiovascular: The cardiovascular examination  reveals a regular rate and rhythm, no obvious murmurs or rubs are noted.  Skin: Extremities are without significant edema.  Neurologic Exam  Mental status: The patient is alert and oriented x 3 at the time of the examination. The patient has apparent normal recent and remote memory, with an apparently normal attention span and concentration ability.  Cranial nerves: Facial symmetry is present. There is good sensation of the face to pinprick and soft touch bilaterally. The strength of the facial muscles and the muscles to head turning and shoulder shrug are normal bilaterally. Speech is well enunciated, no aphasia or dysarthria is noted. Extraocular movements are full. Visual fields are full. The tongue is midline, and the patient has symmetric elevation of the soft palate. No obvious hearing deficits are noted.  Motor: The motor testing reveals 5 over 5 strength of all 4 extremities. Good symmetric motor tone is noted throughout.  Sensory: Sensory testing is intact to pinprick, soft touch, vibration sensation, and position sense on all 4 extremities, with exception that there is a stocking pattern pinprick sensory deficit across the ankles bilaterally. No evidence of extinction is noted.  Coordination: Cerebellar testing reveals good finger-nose-finger and heel-to-shin bilaterally.  Gait and station: Gait is normal. Tandem gait is normal. Romberg is negative. No drift is seen. The patient is able to walk on heels and the toes bilaterally.  Reflexes: Deep tendon reflexes are symmetric and normal bilaterally, with exception that the ankle jerk reflexes are absent bilaterally. Toes are downgoing bilaterally.   Assessment/Plan:  1. 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 The clinical examination is consistent with a peripheral neuropathy. The patient is having symptoms that are bothersome to him, and he has not gained benefit  with amitriptyline or gabapentin previously. The patient will be placed on Cymbalta, and he will follow-up in 4 months. Blood work will be done today looking for various etiologies of peripheral neuropathy.  Jill Alexanders MD 05/31/2014 9:46 AM  [facility] [address] [place], [place] [address]  Phone [phone] Fax [phone]

## 2014-05-31 NOTE — Patient Instructions (Signed)

## 2014-06-01 LAB — SPECIMEN STATUS REPORT

## 2014-06-03 LAB — IFE AND PE, SERUM
ALBUMIN/GLOB SERPL: 1.2 (ref 0.7–2.0)
Albumin SerPl Elph-Mcnc: 4.1 g/dL (ref 3.2–5.6)
Alpha 1: 0.2 g/dL (ref 0.1–0.4)
Alpha2 Glob SerPl Elph-Mcnc: 0.8 g/dL (ref 0.4–1.2)
B-GLOBULIN SERPL ELPH-MCNC: 1.1 g/dL (ref 0.6–1.3)
GAMMA GLOB SERPL ELPH-MCNC: 1.4 g/dL (ref 0.5–1.6)
Globulin, Total: 3.5 g/dL (ref 2.0–4.5)
IGG (IMMUNOGLOBIN G), SERUM: 1385 mg/dL (ref 700–1600)
IGM (IMMUNOGLOBULIN M), SRM: 96 mg/dL (ref 40–230)
IgA/Immunoglobulin A, Serum: 362 mg/dL (ref 91–414)
Total Protein: 7.6 g/dL (ref 6.0–8.5)

## 2014-06-03 LAB — ANA W/REFLEX: ANA: NEGATIVE

## 2014-06-03 LAB — VITAMIN B12: Vitamin B-12: 801 pg/mL (ref 211–946)

## 2014-06-03 LAB — RHEUMATOID FACTOR: RHEUMATOID FACTOR: 11.9 [IU]/mL (ref 0.0–13.9)

## 2014-06-03 LAB — ANGIOTENSIN CONVERTING ENZYME: Angio Convert Enzyme: 37 U/L (ref 14–82)

## 2014-06-03 LAB — COPPER, SERUM: Copper: 116 ug/dL (ref 72–166)

## 2014-06-06 ENCOUNTER — Telehealth: Payer: Self-pay | Admitting: Neurology

## 2014-06-06 NOTE — Telephone Encounter (Signed)
I called patient. The patient indicates that the 30 mg dose of Cymbalta does help his neuropathy pain, he has not been sleeping well, but he has not been taking his Ambien tablet at night either. The patient is to hold off on going up to 60 mg daily for another week to see if the dizziness improves. The dizziness is only present when he first gets out of bed in the morning, and does not persist throughout the day.

## 2014-06-06 NOTE — Telephone Encounter (Signed)
Spoke to patient to relay normal blood work results, and he relayed that since he started the cymbalta he has been getting dizzy.  He is suppose to increase to 60mg  starting tomorrow but he said after 2 days of starting the medicine the dizziness started.  Not sure if its the medicine causing the dizziness.

## 2014-07-13 ENCOUNTER — Ambulatory Visit (INDEPENDENT_AMBULATORY_CARE_PROVIDER_SITE_OTHER): Payer: 59 | Admitting: Nurse Practitioner

## 2014-07-13 ENCOUNTER — Encounter: Payer: Self-pay | Admitting: Nurse Practitioner

## 2014-07-13 VITALS — BP 150/86 | HR 111 | Temp 98.0°F | Ht 69.5 in | Wt 196.6 lb

## 2014-07-13 DIAGNOSIS — G609 Hereditary and idiopathic neuropathy, unspecified: Secondary | ICD-10-CM

## 2014-07-13 MED ORDER — L-METHYLFOLATE-B6-B12 3-35-2 MG PO TABS: 1.0000 | ORAL_TABLET | Freq: Two times a day (BID) | ORAL | Status: DC

## 2014-07-13 NOTE — Progress Notes (Signed)
[facility]  PATIENT: Kyle Patel. DOB: February 27, 1946   REASON FOR VISIT: follow up for peripheral neuropathy HISTORY FROM:patient   HISTORY OF PRESENT ILLNESS:Kyle Patel 69 year old male returns for follow-up. He was last seen in this office by Dr. Jannifer Franklin 05/31/2014 for peripheral neuropathy. Blood work looking for various etiologies of his peripheral neuropathy returned normal.the sensations in his feet have been going on for over 4 years he has a burning tingling sensation in the feet and toes left worse than right. He has not had any significant balance issues and he has not had any falls. He does not use an assistive device. Gabapentin and amitriptyline have been ineffective. He was placed on low-dose Cymbalta but complained of constipation and dizziness. He has continued to have these symptoms even though he has stopped the medication.On questioning his fluid intake he only drinks 2-3 glasses of water a day.He returns for reevaluation.    HISTORY: Kyle Patel is a 69 year old right-handed black male with a history of discomfort in his feet that dates back about 4 years. The patient has had prior lumbosacral spine surgery around 2012 he believes, and he was having only back pain at that point, not pain radiating down the legs. He has had ongoing back pain since surgery, and his feet have become numb and tingling, with cold sensations and burning sensations in the feet and toes. Most of his symptoms are in the feet, not up the legs, and the left foot is worse than the right. The pain and discomfort is worse at nighttime when he is trying to rest and sleep, and he indicates that this does impair his ability to rest. The patient denies any significant balance issues or problems with falling. He denies any neck pain or pain down arms or problems with the hands. He indicates that his mother had diabetes, but he has been tested several times for diabetes and he does not have  problems with his blood sugar. He has been given a trial on gabapentin which was not effective, and he took amitriptyline, but he could not tolerate this secondary to muscle cramping. He has had nerve conduction studies that did not show definite neuropathy, but a small fiber neuropathy was possible. He indicates that a recent MRI of the lumbar spine was done by Dr. Joya Salm, and he was told that there was no reason for his back for his foot pain on the scan. The results of the MRI are not available to me.  REVIEW OF SYSTEMS: Full 14 system review of systems performed and notable only for those listed, all others are neg:  Constitutional: neg  Cardiovascular: neg Ear/Nose/Throat: neg  Skin: neg Eyes: neg Respiratory: neg Gastroitestinal: constipation Hematology/Lymphatic: neg  Endocrine: neg Musculoskeletal:Back pain Allergy/Immunology: neg Neurological: dizziness Psychiatric: neg Sleep : neg   ALLERGIES: Allergies  Allergen Reactions  . Levaquin [Levofloxacin] Itching    HOME MEDICATIONS: Outpatient Prescriptions Prior to Visit  Medication Sig Dispense Refill  . atorvastatin (LIPITOR) 20 MG tablet Take 20 mg by mouth daily.    . hydrochlorothiazide (HYDRODIURIL) 25 MG tablet Take 25 mg by mouth daily.    . Multiple Vitamins-Minerals (CENTRUM PO) Take 1 tablet by mouth every morning.    . zolpidem (AMBIEN) 5 MG tablet Take 5 mg by mouth at bedtime as needed for sleep.    . DULoxetine (CYMBALTA) 30 MG capsule One tablet daily for one week, then take one tablet twice a day (Patient not taking: Reported on  07/13/2014) 60 capsule 3  . diltiazem (CARDIZEM CD) 120 MG 24 hr capsule Take 120 mg by mouth daily.     No facility-administered medications prior to visit.    PAST MEDICAL HISTORY: Past Medical History  Diagnosis Date  . Hypertension   . Hypercholesteremia   . Pulmonary embolism 10/2011  . Arthritis   . AVN (avascular necrosis of bone)     hips  . COPD (chronic obstructive  pulmonary disease)   . 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 Abdominal surgery      colon polyp removed 2007- sepsis, coma for 4 months per pt  . Lumbar laminectomy  06/02/2012  . Lumbar laminectomy/decompression microdiscectomy  06/02/2012    Procedure: LUMBAR LAMINECTOMY/DECOMPRESSION MICRODISCECTOMY 1 LEVEL;  Surgeon: Floyce Stakes, MD;  Location: [facility];  Service: Neurosurgery;  Laterality: Bilateral;  Bilateral Lumbar five-sacral one Foraminotomy and microdiskectomy     FAMILY HISTORY: Family History  Problem Relation Age of Onset  . Arthritis    . Heart attack Mother   . Diabetes Mother   . Stroke Father   . Neuropathy Neg Hx     SOCIAL HISTORY: History   Social History  . Marital Status: Married    Spouse Name: N/A  . Number of Children: 1  . Years of Education: N/A   Occupational History  . retired    Social History Main Topics  . Smoking status: Former Smoker -- 0.10 packs/day for 20 years    Types: Cigarettes    Quit date: 04/25/2004  . Smokeless tobacco: Never Used  . Alcohol Use: No  . Drug Use: No  . Sexual Activity: Not on file   Other Topics Concern  . Not on file   Social History Narrative   Patient is right handed.   Patient drinks caffeine occasionally        PHYSICAL EXAM  Filed Vitals:   07/13/14 0857  BP: 124/73  Pulse: 111  Temp: 98 F (36.7 C)  TempSrc: Oral  Height: 5' 9.5" (1.765 m)  Weight: 196 lb 9.6 oz (89.177 kg)   Body mass index is 28.63 kg/(m^2). General: The patient is alert and cooperative at the time of the examination. The patient is moderately obese. Neck: The neck is supple, no carotid bruits are noted. Respiratory: The respiratory examination is clear. Cardiovascular: The cardiovascular examination reveals a regular rate and rhythm, no obvious murmurs or rubs are noted. Skin: Extremities are without  significant edema.Extremely dry on inspection  Neurologic Exam  Mental status: The patient is alert and oriented x 3 at the time of the examination. The patient has apparent normal recent and remote memory, with an apparently normal attention span and concentration ability.  Cranial nerves: Facial symmetry is present. There is good sensation of the face to pinprick and soft touch bilaterally. The strength of the facial muscles and the muscles to head turning and shoulder shrug are normal bilaterally. Speech is well enunciated, no aphasia or dysarthria is noted.Pupils are equal, round, and reactive to light. Discs are flat bilaterally. Extraocular movements are full. Visual fields are full. The tongue is midline, and the patient has symmetric elevation of the soft palate. No obvious hearing deficits are noted. Motor: The motor testing reveals 5 over 5 strength of all 4 extremities. Good symmetric motor tone is noted throughout. Sensory: Sensory testing is intact to pinprick, soft touch, vibration sensation, and  position sense on all 4 extremities, with exception that there is a stocking pattern pinprick sensory deficit across the ankles bilaterally.  Coordination: Cerebellar testing reveals good finger-nose-finger and heel-to-shin bilaterally. Gait and station: Gait is normal. Tandem gait is normal. Romberg is negative. No drift is seen. The patient is able to walk on heels and the toes bilaterally. Reflexes: Deep tendon reflexes are symmetric and normal bilaterally, with exception that the ankle jerk reflexes are absent bilaterally. Toes are downgoing bilaterally.    DIAGNOSTIC DATA (LABS, IMAGING, TESTING) - I reviewed patient records, labs, notes, testing and imaging myself where available.    Lab Results  Component Value Date   [id number] 05/31/2014   ASSESSMENT AND PLAN  68 y.o. year old male  has a past medical history of Hypertension; Hypercholesteremia; Pulmonary embolism  (10/2011); Arthritis; AVN (avascular necrosis of bone); COPD (chronic obstructive pulmonary disease); and Hereditary and idiopathic peripheral neuropathy (05/31/2014). here to follow-up. He was placed on Cymbalta for his neuropathy pain however he complained of constipation and dizziness and stopped the medication. He has continued to have the symptoms. It is also noted he drinks little water during the day 2-3 glasses at most.  Cymbalta 30 mg at bedtime for 1 month the biggest side effect of the drug is nausea Try Metanx  Twice daily  Please drink 6-8 glasses of water daily to prevent dehydration F/U in 4 months Kyle Bible, [facility], [facility], APRN  [facility] [address], [place], [place] [address] [phone]

## 2014-07-13 NOTE — Patient Instructions (Addendum)
Cymbalta 30 mg at bedtime for 1 month the biggest side effect of the drug is nausea Try Metanx  Twice daily  F/U in 4 months

## 2014-07-13 NOTE — Progress Notes (Signed)
I have read the note, and I agree with the clinical assessment and plan.  Kyle Patel,Kyle Patel   

## 2014-10-03 ENCOUNTER — Ambulatory Visit: Payer: 59 | Admitting: Nurse Practitioner

## 2014-12-11 ENCOUNTER — Encounter: Payer: Self-pay | Admitting: Nurse Practitioner

## 2014-12-11 ENCOUNTER — Ambulatory Visit (INDEPENDENT_AMBULATORY_CARE_PROVIDER_SITE_OTHER): Payer: Medicare Other | Admitting: Nurse Practitioner

## 2014-12-11 ENCOUNTER — Encounter (INDEPENDENT_AMBULATORY_CARE_PROVIDER_SITE_OTHER): Payer: Self-pay

## 2014-12-11 ENCOUNTER — Telehealth: Payer: Self-pay

## 2014-12-11 VITALS — BP 128/76 | HR 77 | Ht 69.5 in | Wt 193.5 lb

## 2014-12-11 DIAGNOSIS — G609 Hereditary and idiopathic neuropathy, unspecified: Secondary | ICD-10-CM

## 2014-12-11 MED ORDER — LIDOCAINE 5 % EX PTCH: 1.0000 | MEDICATED_PATCH | CUTANEOUS | Status: DC

## 2014-12-11 NOTE — Telephone Encounter (Signed)
[facility]) has approved the request for coverage on Lidoderm (Lidocaine) Patches effective until 12/11/2015 Ref # [id number]

## 2014-12-11 NOTE — Progress Notes (Signed)
I have read the note, and I agree with the clinical assessment and plan.  Kyle Patel,Kyle Patel   

## 2014-12-11 NOTE — Progress Notes (Signed)
[facility]  PATIENT: Kyle Patel. DOB: 01-Feb-1946   REASON FOR VISIT: Follow-up for peripheral neuropathy HISTORY FROM: Patient    HISTORY OF PRESENT ILLNESS:Kyle Patel,  69 year old male returns for follow-up. He was last seen in this office 07/13/2014. He has a history of peripheral neuropathy . Blood work looking for various etiologies of his peripheral neuropathy returned normal.the sensations in his feet have been going on for over 5 years he has a burning tingling sensation in the feet and toes left worse than right. The pain is worse at night when he is trying to sleep .He has not had any significant balance issues and he has not had any falls. He does not use an assistive device. Gabapentin and amitriptyline have been ineffective. He has also been on Lyrica in the past and low-dose Cymbalta but complained of constipation and dizziness with these. He was placed on Metanx at his last visit. He claims he stopped the medication after 2 months because it was ineffective and he had significant constipation. He returns for reevaluation    HISTORY: Kyle Patel is a 69 year old right-handed black male with a history of discomfort in his feet that dates back about 4 years. The patient has had prior lumbosacral spine surgery around 2012 he believes, and he was having only back pain at that point, not pain radiating down the legs. He has had ongoing back pain since surgery, and his feet have become numb and tingling, with cold sensations and burning sensations in the feet and toes. Most of his symptoms are in the feet, not up the legs, and the left foot is worse than the right. The pain and discomfort is worse at nighttime when he is trying to rest and sleep, and he indicates that this does impair his ability to rest. The patient denies any significant balance issues or problems with falling. He denies any neck pain or pain down arms or problems with the hands. He indicates that his  mother had diabetes, but he has been tested several times for diabetes and he does not have problems with his blood sugar. He has been given a trial on gabapentin which was not effective, and he took amitriptyline, but he could not tolerate this secondary to muscle cramping. He has had nerve conduction studies that did not show definite neuropathy, but a small fiber neuropathy was possible. He indicates that a recent MRI of the lumbar spine was done by Dr. Joya Salm, and he was told that there was no reason for his back for his foot pain on the scan. The results of the MRI are not available to me.    REVIEW OF SYSTEMS: Full 14 system review of systems performed and notable only for those listed, all others are neg:  Constitutional: Fatigue Cardiovascular: neg Ear/Nose/Throat: neg  Skin: neg Eyes: neg Respiratory: neg Gastroitestinal: Constipation, decreased urination  Hematology/Lymphatic: neg  Endocrine: neg Musculoskeletal: Muscle pain Allergy/Immunology: neg Neurological: neg Psychiatric: neg Sleep : neg   ALLERGIES: Allergies  Allergen Reactions  . Levaquin [Levofloxacin] Itching    HOME MEDICATIONS: Outpatient Prescriptions Prior to Visit  Medication Sig Dispense Refill  . diltiazem (CARDIZEM CD) 240 MG 24 hr capsule Take 1 capsule by mouth daily.    . DULoxetine (CYMBALTA) 30 MG capsule One tablet daily for one week, then take one tablet twice a day 60 capsule 3  . hydrochlorothiazide (HYDRODIURIL) 25 MG tablet Take 25 mg by mouth daily.    . Multiple Vitamins-Minerals (  CENTRUM PO) Take 1 tablet by mouth every morning.    . zolpidem (AMBIEN) 5 MG tablet Take 5 mg by mouth at bedtime as needed for sleep.    Marland Kitchen atorvastatin (LIPITOR) 20 MG tablet Take 20 mg by mouth daily.    Marland Kitchen l-methylfolate-B6-B12 (METANX) 3-35-2 MG TABS Take 1 tablet by mouth 2 (two) times daily. (Patient not taking: Reported on 12/11/2014) 60 tablet 6   No facility-administered medications prior to visit.     PAST MEDICAL HISTORY: Past Medical History  Diagnosis Date  . Hypertension   . Hypercholesteremia   . Pulmonary embolism 10/2011  . Arthritis   . AVN (avascular necrosis of bone)     hips  . COPD (chronic obstructive pulmonary disease)   . 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 Abdominal surgery      colon polyp removed 2007- sepsis, coma for 4 months per pt  . Lumbar laminectomy  06/02/2012  . Lumbar laminectomy/decompression microdiscectomy  06/02/2012    Procedure: LUMBAR LAMINECTOMY/DECOMPRESSION MICRODISCECTOMY 1 LEVEL;  Surgeon: Floyce Stakes, MD;  Location: [facility];  Service: Neurosurgery;  Laterality: Bilateral;  Bilateral Lumbar five-sacral one Foraminotomy and microdiskectomy     FAMILY HISTORY: Family History  Problem Relation Age of Onset  . Arthritis    . Heart attack Mother   . Diabetes Mother   . Stroke Father   . Neuropathy Neg Hx     SOCIAL HISTORY: History   Social History  . Marital Status: Married    Spouse Name: N/A  . Number of Children: 1  . Years of Education: N/A   Occupational History  . retired    Social History Main Topics  . Smoking status: Former Smoker -- 0.10 packs/day for 20 years    Types: Cigarettes    Quit date: 04/25/2004  . Smokeless tobacco: Never Used  . Alcohol Use: No  . Drug Use: No  . Sexual Activity: Not on file   Other Topics Concern  . Not on file   Social History Narrative   Patient is right handed.   Patient drinks caffeine occasionally        PHYSICAL EXAM  Filed Vitals:   12/11/14 1001  BP: 128/76  Pulse: 77  Height: 5' 9.5" (1.765 m)  Weight: 193 lb 8 oz (87.771 kg)   Body mass index is 28.17 kg/(m^2). General: The patient is alert and cooperative at the time of the examination. The patient is moderately obese. Neck: The neck is supple, no carotid bruits are noted. Respiratory: The  respiratory examination is clear. Cardiovascular: The cardiovascular examination reveals a regular rate and rhythm, no obvious murmurs or rubs are noted. Skin: Extremities are without significant edema. Dry on inspection  Neurologic Exam  Mental status: The patient is alert and oriented  at the time of the examination. The patient has apparent normal recent and remote memory, with an apparently normal attention span and concentration ability.  Cranial nerves: Facial symmetry is present. There is good sensation of the face to pinprick and soft touch bilaterally. The strength of the facial muscles and the muscles to head turning and shoulder shrug are normal bilaterally. Speech is well enunciated, no aphasia or dysarthria is noted.Pupils are equal, round, and reactive to light.  Extraocular movements are full. Visual fields are full. The tongue is midline, and the patient has symmetric elevation of the soft palate. No obvious  hearing deficits are noted. Motor: The motor testing reveals 5 over 5 strength of all 4 extremities. Good symmetric motor tone is noted throughout. Sensory: Sensory testing is intact to pinprick, soft touch, vibration sensation, and position sense on all 4 extremities, with exception that there is a stocking pattern pinprick sensory deficit across the ankles bilaterally.  Coordination: Cerebellar testing reveals good finger-nose-finger and heel-to-shin bilaterally. Gait and station: Gait is normal. Tandem gait is normal. Romberg is negative. No drift is seen. The patient is able to walk on heels and the toes bilaterally. Reflexes: Deep tendon reflexes are symmetric and normal bilaterally, with exception that the ankle jerk reflexes are absent bilaterally. Toes are downgoing bilaterally.   DIAGNOSTIC DATA (LABS, IMAGING, TESTING) -   Lab Results  Component Value Date   [id number] 05/31/2014    ASSESSMENT AND PLAN  69 y.o. year old male  has a past medical history  of Hypertension; Hypercholesteremia; Pulmonary embolism (10/2011); Arthritis; AVN (avascular necrosis of bone); COPD (chronic obstructive pulmonary disease); and Hereditary and idiopathic peripheral neuropathy (05/31/2014). here to follow-up. He has failed Lyrica, gabapentin, amitriptyline, Cymbalta and Metanx. either is ineffective or side effects of constipation and dizziness.  Trial of Lidoderm patch to affected area on the feet B complex vitamin Follow-up in 6-8 months Dennie Bible, [facility], [facility], [facility] [address], [place], [place] [address] [phone]

## 2014-12-11 NOTE — Patient Instructions (Signed)
Trial of Lidoderm patch to affected area on the feet B complex vitamin Follow-up in 6-8 months

## 2014-12-14 ENCOUNTER — Telehealth: Payer: Self-pay

## 2014-12-14 NOTE — Telephone Encounter (Signed)
I spoke with Kyle Patel at the pharmacy who verified they did get Rx on 07/18 Rx and will process it today.  I called and spoke with patient.  He is aware.

## 2015-03-07 ENCOUNTER — Encounter: Payer: Self-pay | Admitting: Internal Medicine

## 2015-03-08 DIAGNOSIS — G8929 Other chronic pain: Secondary | ICD-10-CM | POA: Insufficient documentation

## 2015-05-23 ENCOUNTER — Ambulatory Visit: Payer: Self-pay | Admitting: Nurse Practitioner

## 2015-06-06 ENCOUNTER — Ambulatory Visit: Payer: Self-pay | Admitting: Gastroenterology

## 2015-06-11 ENCOUNTER — Encounter: Payer: Self-pay | Admitting: Nurse Practitioner

## 2015-06-11 ENCOUNTER — Ambulatory Visit (INDEPENDENT_AMBULATORY_CARE_PROVIDER_SITE_OTHER): Payer: Medicare Other | Admitting: Nurse Practitioner

## 2015-06-11 ENCOUNTER — Other Ambulatory Visit: Payer: Self-pay

## 2015-06-11 VITALS — BP 140/76 | HR 88 | Temp 97.0°F | Ht 69.0 in | Wt 199.0 lb

## 2015-06-11 DIAGNOSIS — Z800 Family history of malignant neoplasm of digestive organs: Secondary | ICD-10-CM

## 2015-06-11 DIAGNOSIS — Z1211 Encounter for screening for malignant neoplasm of colon: Secondary | ICD-10-CM | POA: Insufficient documentation

## 2015-06-11 MED ORDER — PEG 3350-KCL-NA BICARB-NACL 420 G PO SOLR: 4000.0000 mL | ORAL | Status: DC

## 2015-06-11 NOTE — Progress Notes (Signed)
Referring Provider: [name], MD Primary Care Physician:  Alonza Bogus, MD Primary GI:  Dr. Gala Romney  Chief Complaint  Patient presents with  . set up TCS    HPI:   70 year old male presents for recall colonoscopy appointment. Last colonoscopy was completed 03/22/2010 on conscious sedation. Findings included normal rectum, diminutive polyp at the mouth of the ileocecal valve status post polypectomy. Pathology found benign small bowel mucosa with hyperplastic lymphoid aggregate. Recommended repeat colonoscopy in 5 years.  Today he states he's doing well. Denies abdominal pain, persistent N/V, hematochezia, melena. Has had some N/V the last 3 times he ate at [employer] Denies fever, chills, unintentional weight loss. Denies chest pain, dyspnea, dizziness, lightheadedness, syncope, near syncope. Denies any other upper or lower GI symptoms.  Past Medical History  Diagnosis Date  . Hypertension   . Hypercholesteremia   . Pulmonary embolism ([facility]) 10/2011  . Arthritis   . 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 COPD (chronic obstructive pulmonary disease) ([facility])   . 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 Abdominal surgery      colon polyp removed 2007- sepsis, coma for 4 months per pt  . Lumbar laminectomy  06/02/2012  . Lumbar laminectomy/decompression microdiscectomy  06/02/2012    Procedure: LUMBAR LAMINECTOMY/DECOMPRESSION MICRODISCECTOMY 1 LEVEL;  Surgeon: Floyce Stakes, MD;  Location: [facility];  Service: Neurosurgery;  Laterality: Bilateral;  Bilateral Lumbar five-sacral one Foraminotomy and microdiskectomy   . Colonoscopy  03/22/2010    RMR: 1. Normal rectum 2. Diminutive polyp at the mouth of the ileocecal valve  status post cold biopsy removal. Remaideer of the colonic mucosa and terminal ileum mucosa appeared normal.     Current Outpatient Prescriptions  Medication Sig  Dispense Refill  . aspirin 81 MG tablet Take 81 mg by mouth daily.    Marland Kitchen atorvastatin (LIPITOR) 20 MG tablet Take 20 mg by mouth daily.    . cholecalciferol (VITAMIN D) 1000 UNITS tablet Take 1,000 Units by mouth daily.    Marland Kitchen diltiazem (CARDIZEM CD) 240 MG 24 hr capsule Take 1 capsule by mouth daily.    . hydrochlorothiazide (HYDRODIURIL) 25 MG tablet Take 25 mg by mouth daily.    . Multiple Vitamins-Minerals (CENTRUM PO) Take 1 tablet by mouth every morning.    Marland Kitchen Specialty Vitamins Products (MAGNESIUM, AMINO ACID CHELATE,) 133 MG tablet Take 1 tablet by mouth daily. OTC    . lidocaine (LIDODERM) 5 % Place 1 patch onto the skin daily. Remove & Discard patch within 12 hours or as directed by MD (Patient not taking: Reported on 06/11/2015) 30 patch 6  . zolpidem (AMBIEN) 5 MG tablet Take 5 mg by mouth at bedtime as needed for sleep. Reported on 06/11/2015     No current facility-administered medications for this visit.    Allergies as of 06/11/2015 - Review Complete 06/11/2015  Allergen Reaction Noted  . Levaquin [levofloxacin] Itching 11/12/2011    Family History  Problem Relation Age of Onset  . Arthritis    . Heart attack Mother   . Diabetes Mother   . Stroke Father   . Neuropathy Neg Hx     Social History   Social History  . Marital Status: Married    Spouse Name: N/A  . Number of Children: 1  . Years of Education: N/A   Occupational History  . retired  Social History Main Topics  . Smoking status: Former Smoker -- 0.10 packs/day for 20 years    Types: Cigarettes    Quit date: 04/25/2004  . Smokeless tobacco: Never Used  . Alcohol Use: No  . Drug Use: No  . Sexual Activity: Not Asked   Other Topics Concern  . None   Social History Narrative   Patient is right handed.   Patient drinks caffeine occasionally       Review of Systems: General: Negative for anorexia, weight loss, fever, chills, fatigue, weakness. Eyes: Negative for vision changes.  ENT: Negative  for hoarseness, difficulty swallowing. CV: Negative for chest pain, angina, palpitations, peripheral edema.  Respiratory: Negative for dyspnea at rest, cough, sputum, wheezing.  GI: See history of present illness. MS: Negative for joint pain, low back pain.  Derm: Negative for rash or itching.  Endo: Negative for unusual weight change.  Heme: Negative for bruising or bleeding.   Physical Exam: BP 140/76 mmHg  Pulse 88  Temp(Src) 97 F (36.1 C)  Ht 5\' 9"  (1.753 m)  Wt 199 lb (90.266 kg)  BMI 29.37 kg/m2 General:   Alert and oriented. Pleasant and cooperative. Well-nourished and well-developed.  Head:  Normocephalic and atraumatic. Eyes:  Without icterus, sclera clear and conjunctiva pink.  Ears:  Normal auditory acuity. Cardiovascular:  S1, S2 present without murmurs appreciated. Extremities without clubbing or edema. Respiratory:  Clear to auscultation bilaterally. No wheezes, rales, or rhonchi. No distress.  Gastrointestinal:  +BS, soft, non-tender and non-distended. No HSM noted. No guarding or rebound. No masses appreciated.  Rectal:  Deferred  Skin:  Intact without significant lesions or rashes. Neurologic:  Alert and oriented x4;  grossly normal neurologically. Psych:  Alert and cooperative. Normal mood and affect. Heme/Lymph/Immune: No excessive bruising noted.    06/11/2015 1:55 PM

## 2015-06-11 NOTE — Assessment & Plan Note (Signed)
Patient within the past 1-2 years discovered his baby sister actually had colon cancer in her mid 35s to early 52s. He is unsure of the exact age of onset. She had surgical resection and is currently doing quite well. His last colonoscopy found a single diminutive polyp which was hyperplastic in nature on pathology report. Recommend 5 year repeat colonoscopy which we will move forward with at this time.  Proceed with TCS with Dr. Gala Romney with 12.5 mg preprocedure phenergan in near future: the risks, benefits, and alternatives have been discussed with the patient in detail. The patient states understanding and desires to proceed.  The patient is not on any anticoagulants, anxiolytics, chronic pain medications, or antidepressants. States during his last colonoscopy he woke up in the middle and required extra medicine to put him back to sleep. We'll provide 12.5 mg preprocedure Phenergan to promote adequate sedation. Otherwise, conscious sedation should be adequate for his procedure.

## 2015-06-11 NOTE — Progress Notes (Signed)
cc'ed to pcp °

## 2015-06-11 NOTE — Patient Instructions (Signed)
1. We will schedule your procedure for you. 2. Further recommendations to be based on results of your procedure. 

## 2015-06-11 NOTE — Assessment & Plan Note (Signed)
Patient due for repeat colonoscopy, on recall for 5 year return. Single polyp noted last colonoscopy which was hyperplastic. We'll proceed with colonoscopy as noted above. He does have a newly discovered family history of colon cancer as noted above.

## 2015-06-13 ENCOUNTER — Ambulatory Visit: Payer: Medicare Other | Admitting: Nurse Practitioner

## 2015-06-25 ENCOUNTER — Ambulatory Visit (HOSPITAL_COMMUNITY)
Admission: RE | Admit: 2015-06-25 | Discharge: 2015-06-25 | Disposition: A | Discharge status: Home / Self Care | Payer: Medicare Other | Source: Ambulatory Visit | Attending: Internal Medicine | Admitting: Internal Medicine

## 2015-06-25 ENCOUNTER — Encounter (HOSPITAL_COMMUNITY)
Admission: RE | Disposition: A | Discharge status: Home / Self Care | Payer: Self-pay | Source: Ambulatory Visit | Attending: Internal Medicine

## 2015-06-25 ENCOUNTER — Encounter (HOSPITAL_COMMUNITY): Payer: Self-pay | Admitting: *Deleted

## 2015-06-25 DIAGNOSIS — Z79899 Other long term (current) drug therapy: Secondary | ICD-10-CM | POA: Diagnosis not present

## 2015-06-25 DIAGNOSIS — G609 Hereditary and idiopathic neuropathy, unspecified: Secondary | ICD-10-CM | POA: Insufficient documentation

## 2015-06-25 DIAGNOSIS — E7800 Pure hypercholesterolemia, unspecified: Secondary | ICD-10-CM | POA: Insufficient documentation

## 2015-06-25 DIAGNOSIS — Z86711 Personal history of pulmonary embolism: Secondary | ICD-10-CM | POA: Diagnosis not present

## 2015-06-25 DIAGNOSIS — J449 Chronic obstructive pulmonary disease, unspecified: Secondary | ICD-10-CM | POA: Insufficient documentation

## 2015-06-25 DIAGNOSIS — K5730 Diverticulosis of large intestine without perforation or abscess without bleeding: Secondary | ICD-10-CM | POA: Diagnosis not present

## 2015-06-25 DIAGNOSIS — Z87891 Personal history of nicotine dependence: Secondary | ICD-10-CM | POA: Insufficient documentation

## 2015-06-25 DIAGNOSIS — M1991 Primary osteoarthritis, unspecified site: Secondary | ICD-10-CM | POA: Insufficient documentation

## 2015-06-25 DIAGNOSIS — Z86010 Personal history of colonic polyps: Secondary | ICD-10-CM

## 2015-06-25 DIAGNOSIS — D122 Benign neoplasm of ascending colon: Secondary | ICD-10-CM

## 2015-06-25 DIAGNOSIS — I10 Essential (primary) hypertension: Secondary | ICD-10-CM | POA: Insufficient documentation

## 2015-06-25 DIAGNOSIS — Z7982 Long term (current) use of aspirin: Secondary | ICD-10-CM | POA: Insufficient documentation

## 2015-06-25 DIAGNOSIS — Z1211 Encounter for screening for malignant neoplasm of colon: Secondary | ICD-10-CM | POA: Insufficient documentation

## 2015-06-25 HISTORY — PX: COLONOSCOPY: SHX5424

## 2015-06-25 SURGERY — COLONOSCOPY
Anesthesia: Moderate Sedation

## 2015-06-25 MED ORDER — MIDAZOLAM HCL 5 MG/5ML IJ SOLN
INTRAMUSCULAR | Status: DC | PRN
  Administered 2015-06-25: 2 mg via INTRAVENOUS
  Administered 2015-06-25: 1 mg via INTRAVENOUS

## 2015-06-25 MED ORDER — PROMETHAZINE HCL 25 MG/ML IJ SOLN
INTRAMUSCULAR | Status: AC
  Filled 2015-06-25: qty 1

## 2015-06-25 MED ORDER — PROMETHAZINE HCL 25 MG/ML IJ SOLN
12.5000 mg | Freq: Once | INTRAMUSCULAR | Status: AC
  Administered 2015-06-25: 12.5 mg via INTRAVENOUS

## 2015-06-25 MED ORDER — SODIUM CHLORIDE 0.9% FLUSH
INTRAVENOUS | Status: AC
  Filled 2015-06-25: qty 10

## 2015-06-25 MED ORDER — ONDANSETRON HCL 4 MG/2ML IJ SOLN
INTRAMUSCULAR | Status: AC
  Filled 2015-06-25: qty 2

## 2015-06-25 MED ORDER — MIDAZOLAM HCL 5 MG/5ML IJ SOLN
INTRAMUSCULAR | Status: AC
  Filled 2015-06-25: qty 10

## 2015-06-25 MED ORDER — MEPERIDINE HCL 100 MG/ML IJ SOLN
INTRAMUSCULAR | Status: DC | PRN
Start: 2015-06-25 — End: 2015-06-25
  Administered 2015-06-25: 50 mg via INTRAVENOUS

## 2015-06-25 MED ORDER — ONDANSETRON HCL 4 MG/2ML IJ SOLN
INTRAMUSCULAR | Status: DC | PRN
  Administered 2015-06-25: 4 mg via INTRAVENOUS

## 2015-06-25 MED ORDER — MEPERIDINE HCL 100 MG/ML IJ SOLN
INTRAMUSCULAR | Status: DC
Start: 2015-06-25 — End: 2015-06-25
  Filled 2015-06-25: qty 2

## 2015-06-25 MED ORDER — SODIUM CHLORIDE 0.9 % IV SOLN
INTRAVENOUS | Status: DC
  Administered 2015-06-25: 13:00:00 via INTRAVENOUS

## 2015-06-25 MED ORDER — STERILE WATER FOR IRRIGATION IR SOLN
Status: DC | PRN
  Administered 2015-06-25: 15:00:00

## 2015-06-25 NOTE — H&P (View-Only) (Signed)
Referring Provider: [name], MD Primary Care Physician:  Alonza Bogus, MD Primary GI:  Dr. Gala Romney  Chief Complaint  Patient presents with  . set up TCS    HPI:   70 year old male presents for recall colonoscopy appointment. Last colonoscopy was completed 03/22/2010 on conscious sedation. Findings included normal rectum, diminutive polyp at the mouth of the ileocecal valve status post polypectomy. Pathology found benign small bowel mucosa with hyperplastic lymphoid aggregate. Recommended repeat colonoscopy in 5 years.  Today he states he's doing well. Denies abdominal pain, persistent N/V, hematochezia, melena. Has had some N/V the last 3 times he ate at [employer] Denies fever, chills, unintentional weight loss. Denies chest pain, dyspnea, dizziness, lightheadedness, syncope, near syncope. Denies any other upper or lower GI symptoms.  Past Medical History  Diagnosis Date  . Hypertension   . Hypercholesteremia   . Pulmonary embolism ([place]) 10/2011  . Arthritis   . 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 COPD (chronic obstructive pulmonary disease) ([place])   . 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 Abdominal surgery      colon polyp removed 2007- sepsis, coma for 4 months per pt  . Lumbar laminectomy  06/02/2012  . Lumbar laminectomy/decompression microdiscectomy  06/02/2012    Procedure: LUMBAR LAMINECTOMY/DECOMPRESSION MICRODISCECTOMY 1 LEVEL;  Surgeon: Floyce Stakes, MD;  Location: [facility];  Service: Neurosurgery;  Laterality: Bilateral;  Bilateral Lumbar five-sacral one Foraminotomy and microdiskectomy   . Colonoscopy  03/22/2010    RMR: 1. Normal rectum 2. Diminutive polyp at the mouth of the ileocecal valve  status post cold biopsy removal. Remaideer of the colonic mucosa and terminal ileum mucosa appeared normal.     Current Outpatient Prescriptions  Medication Sig  Dispense Refill  . aspirin 81 MG tablet Take 81 mg by mouth daily.    Marland Kitchen atorvastatin (LIPITOR) 20 MG tablet Take 20 mg by mouth daily.    . cholecalciferol (VITAMIN D) 1000 UNITS tablet Take 1,000 Units by mouth daily.    Marland Kitchen diltiazem (CARDIZEM CD) 240 MG 24 hr capsule Take 1 capsule by mouth daily.    . hydrochlorothiazide (HYDRODIURIL) 25 MG tablet Take 25 mg by mouth daily.    . Multiple Vitamins-Minerals (CENTRUM PO) Take 1 tablet by mouth every morning.    Marland Kitchen Specialty Vitamins Products (MAGNESIUM, AMINO ACID CHELATE,) 133 MG tablet Take 1 tablet by mouth daily. OTC    . lidocaine (LIDODERM) 5 % Place 1 patch onto the skin daily. Remove & Discard patch within 12 hours or as directed by MD (Patient not taking: Reported on 06/11/2015) 30 patch 6  . zolpidem (AMBIEN) 5 MG tablet Take 5 mg by mouth at bedtime as needed for sleep. Reported on 06/11/2015     No current facility-administered medications for this visit.    Allergies as of 06/11/2015 - Review Complete 06/11/2015  Allergen Reaction Noted  . Levaquin [levofloxacin] Itching 11/12/2011    Family History  Problem Relation Age of Onset  . Arthritis    . Heart attack Mother   . Diabetes Mother   . Stroke Father   . Neuropathy Neg Hx     Social History   Social History  . Marital Status: Married    Spouse Name: N/A  . Number of Children: 1  . Years of Education: N/A   Occupational History  . retired  Social History Main Topics  . Smoking status: Former Smoker -- 0.10 packs/day for 20 years    Types: Cigarettes    Quit date: 04/25/2004  . Smokeless tobacco: Never Used  . Alcohol Use: No  . Drug Use: No  . Sexual Activity: Not Asked   Other Topics Concern  . None   Social History Narrative   Patient is right handed.   Patient drinks caffeine occasionally       Review of Systems: General: Negative for anorexia, weight loss, fever, chills, fatigue, weakness. Eyes: Negative for vision changes.  ENT: Negative  for hoarseness, difficulty swallowing. CV: Negative for chest pain, angina, palpitations, peripheral edema.  Respiratory: Negative for dyspnea at rest, cough, sputum, wheezing.  GI: See history of present illness. MS: Negative for joint pain, low back pain.  Derm: Negative for rash or itching.  Endo: Negative for unusual weight change.  Heme: Negative for bruising or bleeding.   Physical Exam: BP 140/76 mmHg  Pulse 88  Temp(Src) 97 F (36.1 C)  Ht 5\' 9"  (1.753 m)  Wt 199 lb (90.266 kg)  BMI 29.37 kg/m2 General:   Alert and oriented. Pleasant and cooperative. Well-nourished and well-developed.  Head:  Normocephalic and atraumatic. Eyes:  Without icterus, sclera clear and conjunctiva pink.  Ears:  Normal auditory acuity. Cardiovascular:  S1, S2 present without murmurs appreciated. Extremities without clubbing or edema. Respiratory:  Clear to auscultation bilaterally. No wheezes, rales, or rhonchi. No distress.  Gastrointestinal:  +BS, soft, non-tender and non-distended. No HSM noted. No guarding or rebound. No masses appreciated.  Rectal:  Deferred  Skin:  Intact without significant lesions or rashes. Neurologic:  Alert and oriented x4;  grossly normal neurologically. Psych:  Alert and cooperative. Normal mood and affect. Heme/Lymph/Immune: No excessive bruising noted.    06/11/2015 1:55 PM

## 2015-06-25 NOTE — Discharge Instructions (Signed)
°Colonoscopy °Discharge Instructions ° °Read the instructions outlined below and refer to this sheet in the next few weeks. These discharge instructions provide you with general information on caring for yourself after you leave the hospital. Your doctor may also give you specific instructions. While your treatment has been planned according to the most current medical practices available, unavoidable complications occasionally occur. If you have any problems or questions after discharge, call Dr. Rourk at [phone]. °ACTIVITY °· You may resume your regular activity, but move at a slower pace for the next 24 hours.  °· Take frequent rest periods for the next 24 hours.  °· Walking will help get rid of the air and reduce the bloated feeling in your belly (abdomen).  °· No driving for 24 hours (because of the medicine (anesthesia) used during the test).   °· Do not sign any important legal documents or operate any machinery for 24 hours (because of the anesthesia used during the test).  °NUTRITION °· Drink plenty of fluids.  °· You may resume your normal diet as instructed by your doctor.  °· Begin with a light meal and progress to your normal diet. Heavy or fried foods are harder to digest and may make you feel sick to your stomach (nauseated).  °· Avoid alcoholic beverages for 24 hours or as instructed.  °MEDICATIONS °· You may resume your normal medications unless your doctor tells you otherwise.  °WHAT YOU CAN EXPECT TODAY °· Some feelings of bloating in the abdomen.  °· Passage of more gas than usual.  °· Spotting of blood in your stool or on the toilet paper.  °IF YOU HAD POLYPS REMOVED DURING THE COLONOSCOPY: °· No aspirin products for 7 days or as instructed.  °· No alcohol for 7 days or as instructed.  °· Eat a soft diet for the next 24 hours.  °FINDING OUT THE RESULTS OF YOUR TEST °Not all test results are available during your visit. If your test results are not back during the visit, make an appointment  with your caregiver to find out the results. Do not assume everything is normal if you have not heard from your caregiver or the medical facility. It is important for you to follow up on all of your test results.  °SEEK IMMEDIATE MEDICAL ATTENTION IF: °· You have more than a spotting of blood in your stool.  °· Your belly is swollen (abdominal distention).  °· You are nauseated or vomiting.  °· You have a temperature over 101.  °· You have abdominal pain or discomfort that is severe or gets worse throughout the day.  ° ° °Polyp and diverticulosis information provided ° °Further recommendations to follow pending review of pathology report ° °Diverticulosis °Diverticulosis is the condition that develops when small pouches (diverticula) form in the wall of your colon. Your colon, or large intestine, is where water is absorbed and stool is formed. The pouches form when the inside layer of your colon pushes through weak spots in the outer layers of your colon. °CAUSES  °No one knows exactly what causes diverticulosis. °RISK FACTORS °· Being older than 50. Your risk for this condition increases with age. Diverticulosis is rare in people younger than 40 years. By age 80, almost everyone has it. °· Eating a low-fiber diet. °· Being frequently constipated. °· Being overweight. °· Not getting enough exercise. °· Smoking. °· Taking over-the-counter pain medicines, like aspirin and ibuprofen. °SYMPTOMS  °Most people with diverticulosis do not have symptoms. °DIAGNOSIS  °Because   diverticulosis often has no symptoms, health care providers often discover the condition during an exam for other colon problems. In many cases, a health care provider will diagnose diverticulosis while using a flexible scope to examine the colon (colonoscopy). °TREATMENT  °If you have never developed an infection related to diverticulosis, you may not need treatment. If you have had an infection before, treatment may include: °· Eating more fruits,  vegetables, and grains. °· Taking a fiber supplement. °· Taking a live bacteria supplement (probiotic). °· Taking medicine to relax your colon. °HOME CARE INSTRUCTIONS  °· Drink at least 6-8 glasses of water each day to prevent constipation. °· Try not to strain when you have a bowel movement. °· Keep all follow-up appointments. °If you have had an infection before:  °· Increase the fiber in your diet as directed by your health care provider or dietitian. °· Take a dietary fiber supplement if your health care provider approves. °· Only take medicines as directed by your health care provider. °SEEK MEDICAL CARE IF:  °· You have abdominal pain. °· You have bloating. °· You have cramps. °· You have not gone to the bathroom in 3 days. °SEEK IMMEDIATE MEDICAL CARE IF:  °· Your pain gets worse. °· Your bloating becomes very bad. °· You have a fever or chills, and your symptoms suddenly get worse. °· You begin vomiting. °· You have bowel movements that are bloody or black. °MAKE SURE YOU: °· Understand these instructions. °· Will watch your condition. °· Will get help right away if you are not doing well or get worse. °  °This information is not intended to replace advice given to you by your health care provider. Make sure you discuss any questions you have with your health care provider. °  °Document Released: 02/07/2004 Document Revised: 05/17/2013 Document Reviewed: 04/06/2013 °Elsevier Interactive Patient Education ©2016 Elsevier Inc. °Colon Polyps °Polyps are lumps of extra tissue growing inside the body. Polyps can grow in the large intestine (colon). Most colon polyps are noncancerous (benign). However, some colon polyps can become cancerous over time. Polyps that are larger than a pea may be harmful. To be safe, caregivers remove and test all polyps. °CAUSES  °Polyps form when mutations in the genes cause your cells to grow and divide even though no more tissue is needed. °RISK FACTORS °There are a number of risk  factors that can increase your chances of getting colon polyps. They include: °· Being older than 50 years. °· Family history of colon polyps or colon cancer. °· Long-term colon diseases, such as colitis or Crohn disease. °· Being overweight. °· Smoking. °· Being inactive. °· Drinking too much alcohol. °SYMPTOMS  °Most small polyps do not cause symptoms. If symptoms are present, they may include: °· Blood in the stool. The stool may look dark red or black. °· Constipation or diarrhea that lasts longer than 1 week. °DIAGNOSIS °People often do not know they have polyps until their caregiver finds them during a regular checkup. Your caregiver can use 4 tests to check for polyps: °· Digital rectal exam. The caregiver wears gloves and feels inside the rectum. This test would find polyps only in the rectum. °· Barium enema. The caregiver puts a liquid called barium into your rectum before taking X-rays of your colon. Barium makes your colon look white. Polyps are dark, so they are easy to see in the X-ray pictures. °· Sigmoidoscopy. A thin, flexible tube (sigmoidoscope) is placed into your rectum. The sigmoidoscope has a light   and tiny camera in it. The caregiver uses the sigmoidoscope to look at the last third of your colon. °· Colonoscopy. This test is like sigmoidoscopy, but the caregiver looks at the entire colon. This is the most common method for finding and removing polyps. °TREATMENT  °Any polyps will be removed during a sigmoidoscopy or colonoscopy. The polyps are then tested for cancer. °PREVENTION  °To help lower your risk of getting more colon polyps: °· Eat plenty of fruits and vegetables. Avoid eating fatty foods. °· Do not smoke. °· Avoid drinking alcohol. °· Exercise every day. °· Lose weight if recommended by your caregiver. °· Eat plenty of calcium and folate. Foods that are rich in calcium include milk, cheese, and broccoli. Foods that are rich in folate include chickpeas, kidney beans, and  spinach. °HOME CARE INSTRUCTIONS °Keep all follow-up appointments as directed by your caregiver. You may need periodic exams to check for polyps. °SEEK MEDICAL CARE IF: °You notice bleeding during a bowel movement. °  °This information is not intended to replace advice given to you by your health care provider. Make sure you discuss any questions you have with your health care provider. °  °Document Released: 02/06/2004 Document Revised: 06/02/2014 Document Reviewed: 07/22/2011 °Elsevier Interactive Patient Education ©2016 Elsevier Inc. ° °

## 2015-06-25 NOTE — Op Note (Signed)
[facility] [address] [place], [address]   COLONOSCOPY PROCEDURE REPORT  PATIENT: Kyle Patel, Kyle Patel  MR#: [id number] BIRTHDATE: September 03, 1945 , 77  yrs. old GENDER: male ENDOSCOPIST: R.  Garfield Cornea, MD FACP [facility] REFERRED [id number] Luan Pulling, M.D. PROCEDURE DATE:  07-25-2015 PROCEDURE:   Ileo-colonoscopy with biopsy INDICATIONS:surveillance examination; history of polyps. MEDICATIONS: Versed 3 mg IV and Demerol 50 mg IV in divided doses. Phenergan 12.5 mg IV.  Zofran 4 mg IV. ASA CLASS:       Class III  CONSENT: The risks, benefits, alternatives and imponderables including but not limited to bleeding, perforation as well as the possibility of a missed lesion have been reviewed.  The potential for biopsy, lesion removal, etc. have also been discussed. Questions have been answered.  All parties agreeable.  Please see the history and physical in the medical record for more information.  DESCRIPTION OF PROCEDURE:   After the risks benefits and alternatives of the procedure were thoroughly explained, informed consent was obtained.  The digital rectal exam revealed no abnormalities of the rectum.   The [id number]  endoscope was introduced through the anus and advanced to the terminal ileum which was intubated for a short distance. No adverse events experienced.   The quality of the prep was adequate  The instrument was then slowly withdrawn as the colon was fully examined. Estimated blood loss is zero unless otherwise noted in this procedure report.      COLON FINDINGS: Normal-appearing rectal mucosa.  Few scattered pancolonic diverticula; (1) 4 mm polyp in the mid ascending segment; otherwise, the remainder of the colonic mucosa appeared normal.  The distal 5 cm of terminal ileal mucosa also appeared normal. History of colon surgery to remove a large polyp previously. No surgical anastomosis found - indicating very good healing.  The above-mentioned  polyp was cold snare removed.  Retroflexion was performed. .  Withdrawal time=8 minutes 0 seconds.  The scope was withdrawn and the procedure completed. COMPLICATIONS: There were no immediate complications.  ENDOSCOPIC IMPRESSION: Colonic polyp?"removed as described above. Colonic diverticulosis.  RECOMMENDATIONS: Follow up on pathology.  eSigned:  R. Garfield Cornea, MD Rosalita Chessman [facility] 2015/07/25 3:31 PM   cc:  CPT CODES: ICD CODES:  The ICD and CPT codes recommended by this software are interpretations from the data that the clinical staff has captured with the software.  The verification of the translation of this report to the ICD and CPT codes and modifiers is the sole responsibility of the health care institution and practicing physician where this report was generated.  [employer] will not be held responsible for the validity of the ICD and CPT codes included on this report.  AMA assumes no liability for data contained or not contained herein. CPT is a Designer, television/film set of the [employer].  PATIENT NAME:  [name], [name] MR#: [id number]

## 2015-06-25 NOTE — Interval H&P Note (Signed)
History and Physical Interval Note:  06/25/2015 2:58 PM  Kyle Patel.  has presented today for surgery, with the diagnosis of SCREENING/FAMILY HISTORY  The various methods of treatment have been discussed with the patient and family. After consideration of risks, benefits and other options for treatment, the patient has consented to  Procedure(s) with comments: COLONOSCOPY (N/A) - 200-rescheduled 1/30 per Ginger  as a surgical intervention .  The patient's history has been reviewed, patient examined, no change in status, stable for surgery.  I have reviewed the patient's chart and labs.  Questions were answered to the patient's satisfaction.     Beverlee Wilmarth  No change. Patient here for surveillance colonoscopy.The risks, benefits, limitations, alternatives and imponderables have been reviewed with the patient. Questions have been answered. All parties are agreeable.

## 2015-06-28 ENCOUNTER — Encounter: Payer: Self-pay | Admitting: Internal Medicine

## 2015-06-29 ENCOUNTER — Encounter (HOSPITAL_COMMUNITY): Payer: Self-pay | Admitting: Internal Medicine

## 2017-02-02 ENCOUNTER — Encounter: Payer: Self-pay | Admitting: Family Medicine

## 2017-04-24 ENCOUNTER — Inpatient Hospital Stay (HOSPITAL_COMMUNITY): Payer: Medicare Other

## 2017-04-24 ENCOUNTER — Inpatient Hospital Stay (HOSPITAL_COMMUNITY)
Admission: EM | Admit: 2017-04-24 | Discharge: 2017-04-27 | DRG: 251 | Disposition: A | Discharge status: Home / Self Care | Payer: Medicare Other | Attending: Interventional Cardiology | Admitting: Interventional Cardiology

## 2017-04-24 ENCOUNTER — Inpatient Hospital Stay (HOSPITAL_COMMUNITY)
Admission: EM | Disposition: A | Discharge status: Home / Self Care | Payer: Self-pay | Source: Home / Self Care | Attending: Interventional Cardiology

## 2017-04-24 ENCOUNTER — Emergency Department (HOSPITAL_COMMUNITY): Payer: Medicare Other

## 2017-04-24 ENCOUNTER — Encounter (HOSPITAL_COMMUNITY): Payer: Self-pay | Admitting: Emergency Medicine

## 2017-04-24 ENCOUNTER — Other Ambulatory Visit: Payer: Self-pay

## 2017-04-24 DIAGNOSIS — I2510 Atherosclerotic heart disease of native coronary artery without angina pectoris: Secondary | ICD-10-CM | POA: Diagnosis present

## 2017-04-24 DIAGNOSIS — I129 Hypertensive chronic kidney disease with stage 1 through stage 4 chronic kidney disease, or unspecified chronic kidney disease: Secondary | ICD-10-CM | POA: Diagnosis present

## 2017-04-24 DIAGNOSIS — N183 Chronic kidney disease, stage 3 unspecified: Secondary | ICD-10-CM | POA: Diagnosis present

## 2017-04-24 DIAGNOSIS — I214 Non-ST elevation (NSTEMI) myocardial infarction: Secondary | ICD-10-CM | POA: Diagnosis present

## 2017-04-24 DIAGNOSIS — Z823 Family history of stroke: Secondary | ICD-10-CM

## 2017-04-24 DIAGNOSIS — I447 Left bundle-branch block, unspecified: Secondary | ICD-10-CM | POA: Diagnosis present

## 2017-04-24 DIAGNOSIS — R000 Tachycardia, unspecified: Secondary | ICD-10-CM | POA: Diagnosis present

## 2017-04-24 DIAGNOSIS — I10 Essential (primary) hypertension: Secondary | ICD-10-CM | POA: Diagnosis not present

## 2017-04-24 DIAGNOSIS — Z800 Family history of malignant neoplasm of digestive organs: Secondary | ICD-10-CM | POA: Diagnosis not present

## 2017-04-24 DIAGNOSIS — I2699 Other pulmonary embolism without acute cor pulmonale: Secondary | ICD-10-CM | POA: Diagnosis not present

## 2017-04-24 DIAGNOSIS — Z86711 Personal history of pulmonary embolism: Secondary | ICD-10-CM

## 2017-04-24 DIAGNOSIS — R079 Chest pain, unspecified: Secondary | ICD-10-CM

## 2017-04-24 DIAGNOSIS — K30 Functional dyspepsia: Secondary | ICD-10-CM | POA: Diagnosis present

## 2017-04-24 DIAGNOSIS — Z7982 Long term (current) use of aspirin: Secondary | ICD-10-CM

## 2017-04-24 DIAGNOSIS — E785 Hyperlipidemia, unspecified: Secondary | ICD-10-CM

## 2017-04-24 DIAGNOSIS — I2109 ST elevation (STEMI) myocardial infarction involving other coronary artery of anterior wall: Secondary | ICD-10-CM | POA: Diagnosis present

## 2017-04-24 DIAGNOSIS — Z833 Family history of diabetes mellitus: Secondary | ICD-10-CM

## 2017-04-24 DIAGNOSIS — Z87891 Personal history of nicotine dependence: Secondary | ICD-10-CM

## 2017-04-24 DIAGNOSIS — J449 Chronic obstructive pulmonary disease, unspecified: Secondary | ICD-10-CM | POA: Diagnosis present

## 2017-04-24 DIAGNOSIS — N189 Chronic kidney disease, unspecified: Secondary | ICD-10-CM | POA: Diagnosis present

## 2017-04-24 DIAGNOSIS — Z8249 Family history of ischemic heart disease and other diseases of the circulatory system: Secondary | ICD-10-CM | POA: Diagnosis not present

## 2017-04-24 DIAGNOSIS — Z9861 Coronary angioplasty status: Secondary | ICD-10-CM

## 2017-04-24 HISTORY — DX: ST elevation (STEMI) myocardial infarction involving other coronary artery of anterior wall: I21.09

## 2017-04-24 HISTORY — PX: LEFT HEART CATH AND CORONARY ANGIOGRAPHY: CATH118249

## 2017-04-24 HISTORY — PX: CORONARY BALLOON ANGIOPLASTY: CATH118233

## 2017-04-24 LAB — COMPREHENSIVE METABOLIC PANEL
ALK PHOS: 81 U/L (ref 38–126)
ALT: 26 U/L (ref 17–63)
AST: 74 U/L — AB (ref 15–41)
Albumin: 4.1 g/dL (ref 3.5–5.0)
Anion gap: 10 (ref 5–15)
BUN: 30 mg/dL — AB (ref 6–20)
CHLORIDE: 102 mmol/L (ref 101–111)
CO2: 23 mmol/L (ref 22–32)
CREATININE: 1.71 mg/dL — AB (ref 0.61–1.24)
Calcium: 9.4 mg/dL (ref 8.9–10.3)
GFR calc Af Amer: 45 mL/min — ABNORMAL LOW (ref >60)
GFR, EST NON AFRICAN AMERICAN: 39 mL/min — AB (ref >60)
Glucose, Bld: 135 mg/dL — ABNORMAL HIGH (ref 65–99)
Potassium: 4.2 mmol/L (ref 3.5–5.1)
Sodium: 135 mmol/L (ref 135–145)
Total Bilirubin: 1.2 mg/dL (ref 0.3–1.2)
Total Protein: 8.1 g/dL (ref 6.5–8.1)

## 2017-04-24 LAB — URINALYSIS, ROUTINE W REFLEX MICROSCOPIC
Bilirubin Urine: NEGATIVE
Glucose, UA: NEGATIVE mg/dL
KETONES UR: NEGATIVE mg/dL
LEUKOCYTES UA: NEGATIVE
Nitrite: NEGATIVE
PROTEIN: 100 mg/dL — AB
SQUAMOUS EPITHELIAL / LPF: NONE SEEN
Specific Gravity, Urine: 1.018 (ref 1.005–1.030)
pH: 5 (ref 5.0–8.0)

## 2017-04-24 LAB — ECHOCARDIOGRAM COMPLETE
Height: 70 in
Weight: 3120 oz

## 2017-04-24 LAB — HEPARIN LEVEL (UNFRACTIONATED): Heparin Unfractionated: 0.75 IU/mL — ABNORMAL HIGH (ref 0.30–0.70)

## 2017-04-24 LAB — CBC
HEMATOCRIT: 41.9 % (ref 39.0–52.0)
Hemoglobin: 13.5 g/dL (ref 13.0–17.0)
MCH: 30.4 pg (ref 26.0–34.0)
MCHC: 32.2 g/dL (ref 30.0–36.0)
MCV: 94.4 fL (ref 78.0–100.0)
Platelets: 177 10*3/uL (ref 150–400)
RBC: 4.44 MIL/uL (ref 4.22–5.81)
RDW: 13.9 % (ref 11.5–15.5)
WBC: 15.4 10*3/uL — AB (ref 4.0–10.5)

## 2017-04-24 LAB — TROPONIN I
TROPONIN I: 8.08 ng/mL — AB (ref <0.03)
TROPONIN I: 8.82 ng/mL — AB (ref <0.03)
Troponin I: 10.82 ng/mL (ref <0.03)

## 2017-04-24 LAB — MRSA PCR SCREENING: MRSA BY PCR: NEGATIVE

## 2017-04-24 LAB — HEMOGLOBIN A1C
HEMOGLOBIN A1C: 6.4 % — AB (ref 4.8–5.6)
MEAN PLASMA GLUCOSE: 136.98 mg/dL

## 2017-04-24 LAB — I-STAT TROPONIN, ED: Troponin i, poc: 9.09 ng/mL (ref 0.00–0.08)

## 2017-04-24 LAB — APTT: aPTT: 30 seconds (ref 24–36)

## 2017-04-24 LAB — INFLUENZA PANEL BY PCR (TYPE A & B)
INFLBPCR: NEGATIVE
Influenza A By PCR: NEGATIVE

## 2017-04-24 LAB — BRAIN NATRIURETIC PEPTIDE: B NATRIURETIC PEPTIDE 5: 99 pg/mL (ref 0.0–100.0)

## 2017-04-24 LAB — LIPASE, BLOOD: LIPASE: 33 U/L (ref 11–51)

## 2017-04-24 LAB — PROTIME-INR
INR: 1.04
Prothrombin Time: 13.5 seconds (ref 11.4–15.2)

## 2017-04-24 LAB — POCT ACTIVATED CLOTTING TIME: Activated Clotting Time: 373 seconds

## 2017-04-24 SURGERY — LEFT HEART CATH AND CORONARY ANGIOGRAPHY
Anesthesia: LOCAL

## 2017-04-24 MED ORDER — SODIUM CHLORIDE 0.9% FLUSH
3.0000 mL | Freq: Two times a day (BID) | INTRAVENOUS | Status: DC
  Administered 2017-04-24 – 2017-04-27 (×6): 3 mL via INTRAVENOUS

## 2017-04-24 MED ORDER — TIROFIBAN (AGGRASTAT) BOLUS VIA INFUSION
INTRAVENOUS | Status: DC | PRN
  Administered 2017-04-24: 2212.5 ug via INTRAVENOUS

## 2017-04-24 MED ORDER — ASPIRIN 81 MG PO TABS: 81.0000 mg | ORAL_TABLET | Freq: Every day | ORAL | Status: DC

## 2017-04-24 MED ORDER — NITROGLYCERIN IN D5W 200-5 MCG/ML-% IV SOLN
0.0000 ug/min | INTRAVENOUS | Status: DC
Start: 2017-04-24 — End: 2017-04-27
  Administered 2017-04-25: 90 ug/min via INTRAVENOUS
  Filled 2017-04-24: qty 250

## 2017-04-24 MED ORDER — VERAPAMIL HCL 2.5 MG/ML IV SOLN
INTRAVENOUS | Status: AC
  Filled 2017-04-24: qty 2

## 2017-04-24 MED ORDER — HEPARIN (PORCINE) IN NACL 100-0.45 UNIT/ML-% IJ SOLN
16.0000 [IU]/kg/h | Freq: Once | INTRAMUSCULAR | Status: AC
  Administered 2017-04-24: 16 [IU]/kg/h via INTRAVENOUS
  Filled 2017-04-24: qty 250

## 2017-04-24 MED ORDER — LIDOCAINE HCL (PF) 1 % IJ SOLN
INTRAMUSCULAR | Status: DC | PRN
  Administered 2017-04-24: 2 mL via SUBCUTANEOUS

## 2017-04-24 MED ORDER — SODIUM CHLORIDE 0.9 % IV SOLN
INTRAVENOUS | Status: AC | PRN
  Administered 2017-04-24: 20 mL/h via INTRAVENOUS

## 2017-04-24 MED ORDER — HEPARIN (PORCINE) IN NACL 2-0.9 UNIT/ML-% IJ SOLN
INTRAMUSCULAR | Status: AC
  Filled 2017-04-24: qty 1000

## 2017-04-24 MED ORDER — IOPAMIDOL (ISOVUE-370) INJECTION 76%
INTRAVENOUS | Status: AC
  Filled 2017-04-24: qty 100

## 2017-04-24 MED ORDER — ACETAMINOPHEN 325 MG PO TABS: 650.0000 mg | ORAL_TABLET | ORAL | Status: DC | PRN

## 2017-04-24 MED ORDER — MORPHINE SULFATE (PF) 4 MG/ML IV SOLN
4.0000 mg | INTRAVENOUS | Status: DC | PRN
  Administered 2017-04-24 – 2017-04-27 (×2): 4 mg via INTRAVENOUS
  Filled 2017-04-24 (×2): qty 1

## 2017-04-24 MED ORDER — HEPARIN (PORCINE) IN NACL 100-0.45 UNIT/ML-% IJ SOLN
1400.0000 [IU]/h | INTRAMUSCULAR | Status: DC
  Filled 2017-04-24: qty 250

## 2017-04-24 MED ORDER — IOPAMIDOL (ISOVUE-370) INJECTION 76%
INTRAVENOUS | Status: DC | PRN
  Administered 2017-04-24: 100 mL via INTRA_ARTERIAL

## 2017-04-24 MED ORDER — PREGABALIN 75 MG PO CAPS
75.0000 mg | ORAL_CAPSULE | Freq: Every evening | ORAL | Status: DC
  Administered 2017-04-24 – 2017-04-26 (×3): 75 mg via ORAL
  Filled 2017-04-24 (×3): qty 1

## 2017-04-24 MED ORDER — MIDAZOLAM HCL 2 MG/2ML IJ SOLN
INTRAMUSCULAR | Status: DC | PRN
  Administered 2017-04-24: 1 mg via INTRAVENOUS

## 2017-04-24 MED ORDER — SODIUM CHLORIDE 0.9% FLUSH: 3.0000 mL | INTRAVENOUS | Status: DC | PRN

## 2017-04-24 MED ORDER — ONDANSETRON HCL 4 MG/2ML IJ SOLN: 4.0000 mg | Freq: Four times a day (QID) | INTRAMUSCULAR | Status: DC | PRN

## 2017-04-24 MED ORDER — NITROGLYCERIN 0.4 MG SL SUBL: 0.4000 mg | SUBLINGUAL_TABLET | SUBLINGUAL | Status: DC | PRN

## 2017-04-24 MED ORDER — HEPARIN SODIUM (PORCINE) 1000 UNIT/ML IJ SOLN
INTRAMUSCULAR | Status: AC
  Filled 2017-04-24: qty 1

## 2017-04-24 MED ORDER — FENTANYL CITRATE (PF) 100 MCG/2ML IJ SOLN
INTRAMUSCULAR | Status: AC
  Filled 2017-04-24: qty 2

## 2017-04-24 MED ORDER — HEPARIN (PORCINE) IN NACL 2-0.9 UNIT/ML-% IJ SOLN
INTRAMUSCULAR | Status: AC | PRN
  Administered 2017-04-24: 1000 mL

## 2017-04-24 MED ORDER — HYDRALAZINE HCL 20 MG/ML IJ SOLN: 5.0000 mg | INTRAMUSCULAR | Status: AC | PRN

## 2017-04-24 MED ORDER — TICAGRELOR 90 MG PO TABS
90.0000 mg | ORAL_TABLET | Freq: Two times a day (BID) | ORAL | Status: DC
  Administered 2017-04-24 – 2017-04-27 (×6): 90 mg via ORAL
  Filled 2017-04-24 (×6): qty 1

## 2017-04-24 MED ORDER — TICAGRELOR 90 MG PO TABS
ORAL_TABLET | ORAL | Status: DC | PRN
  Administered 2017-04-24: 180 mg via ORAL

## 2017-04-24 MED ORDER — NITROGLYCERIN 2 % TD OINT
1.0000 [in_us] | TOPICAL_OINTMENT | Freq: Once | TRANSDERMAL | Status: AC
  Administered 2017-04-24: 1 [in_us] via TOPICAL
  Filled 2017-04-24: qty 1

## 2017-04-24 MED ORDER — VERAPAMIL HCL 2.5 MG/ML IV SOLN
INTRAVENOUS | Status: DC | PRN
  Administered 2017-04-24: 10 mL via INTRA_ARTERIAL

## 2017-04-24 MED ORDER — VITAMIN D 1000 UNITS PO TABS
1000.0000 [IU] | ORAL_TABLET | Freq: Every day | ORAL | Status: DC
  Administered 2017-04-25 – 2017-04-27 (×3): 1000 [IU] via ORAL
  Filled 2017-04-24 (×3): qty 1

## 2017-04-24 MED ORDER — ATORVASTATIN CALCIUM 80 MG PO TABS
80.0000 mg | ORAL_TABLET | Freq: Every day | ORAL | Status: DC
  Administered 2017-04-24 – 2017-04-26 (×3): 80 mg via ORAL
  Filled 2017-04-24 (×3): qty 1

## 2017-04-24 MED ORDER — ASPIRIN EC 81 MG PO TBEC: 81.0000 mg | DELAYED_RELEASE_TABLET | Freq: Every day | ORAL | Status: DC

## 2017-04-24 MED ORDER — BIVALIRUDIN BOLUS VIA INFUSION - CUPID: INTRAVENOUS | Status: DC | PRN

## 2017-04-24 MED ORDER — HEPARIN SODIUM (PORCINE) 1000 UNIT/ML IJ SOLN
INTRAMUSCULAR | Status: DC | PRN
  Administered 2017-04-24 (×2): 5000 [IU] via INTRAVENOUS

## 2017-04-24 MED ORDER — SODIUM CHLORIDE 0.9 % IV SOLN: INTRAVENOUS | Status: AC

## 2017-04-24 MED ORDER — TIROFIBAN HCL IV 12.5 MG/250 ML
INTRAVENOUS | Status: DC | PRN
  Administered 2017-04-24: 0.075 ug/kg/min via INTRAVENOUS

## 2017-04-24 MED ORDER — ACETAMINOPHEN 325 MG PO TABS
650.0000 mg | ORAL_TABLET | ORAL | Status: DC | PRN
  Administered 2017-04-24 – 2017-04-26 (×2): 650 mg via ORAL
  Filled 2017-04-24: qty 2

## 2017-04-24 MED ORDER — NITROGLYCERIN IN D5W 200-5 MCG/ML-% IV SOLN
5.0000 ug/min | Freq: Once | INTRAVENOUS | Status: AC
  Administered 2017-04-24: 5 ug/min via INTRAVENOUS
  Filled 2017-04-24: qty 250

## 2017-04-24 MED ORDER — NITROGLYCERIN IN D5W 200-5 MCG/ML-% IV SOLN
INTRAVENOUS | Status: AC
  Filled 2017-04-24: qty 250

## 2017-04-24 MED ORDER — TICAGRELOR 90 MG PO TABS
ORAL_TABLET | ORAL | Status: AC
  Filled 2017-04-24: qty 2

## 2017-04-24 MED ORDER — MIDAZOLAM HCL 2 MG/2ML IJ SOLN
INTRAMUSCULAR | Status: AC
  Filled 2017-04-24: qty 2

## 2017-04-24 MED ORDER — FENTANYL CITRATE (PF) 100 MCG/2ML IJ SOLN
INTRAMUSCULAR | Status: DC | PRN
  Administered 2017-04-24: 50 ug via INTRAVENOUS

## 2017-04-24 MED ORDER — TIROFIBAN HCL IN NACL 5-0.9 MG/100ML-% IV SOLN
INTRAVENOUS | Status: AC
  Filled 2017-04-24: qty 100

## 2017-04-24 MED ORDER — SODIUM CHLORIDE 0.9 % IV SOLN
250.0000 mL | INTRAVENOUS | Status: DC | PRN
Start: 2017-04-24 — End: 2017-04-27

## 2017-04-24 MED ORDER — SODIUM CHLORIDE 0.9 % IV SOLN: 250.0000 mL | INTRAVENOUS | Status: DC | PRN

## 2017-04-24 MED ORDER — ACETAMINOPHEN 325 MG PO TABS
ORAL_TABLET | ORAL | Status: AC
  Filled 2017-04-24: qty 2

## 2017-04-24 MED ORDER — LIDOCAINE HCL (PF) 1 % IJ SOLN
INTRAMUSCULAR | Status: AC
  Filled 2017-04-24: qty 30

## 2017-04-24 MED ORDER — ASPIRIN 325 MG PO TABS
325.0000 mg | ORAL_TABLET | Freq: Once | ORAL | Status: AC
  Administered 2017-04-24: 325 mg via ORAL
  Filled 2017-04-24: qty 1

## 2017-04-24 MED ORDER — ASPIRIN 81 MG PO CHEW
81.0000 mg | CHEWABLE_TABLET | Freq: Every day | ORAL | Status: DC
  Administered 2017-04-25 – 2017-04-27 (×3): 81 mg via ORAL
  Filled 2017-04-24 (×3): qty 1

## 2017-04-24 MED ORDER — LABETALOL HCL 5 MG/ML IV SOLN: 10.0000 mg | INTRAVENOUS | Status: AC | PRN

## 2017-04-24 SURGICAL SUPPLY — 20 items
BALLN MAVERICK OTW 2.0X15 (BALLOONS) ×2
BALLN SAPPHIRE 2.0X10 (BALLOONS) ×2
BALLOON MAVERICK OTW 2.0X15 (BALLOONS) ×1 IMPLANT
BALLOON SAPPHIRE 2.0X10 (BALLOONS) ×1 IMPLANT
CATH INFINITI 5 FR JL3.5 (CATHETERS) ×2 IMPLANT
CATH INFINITI JR4 5F (CATHETERS) ×2 IMPLANT
CATH LAUNCHER 6FR EBU 3 (CATHETERS) ×2 IMPLANT
DEVICE RAD COMP TR BAND LRG (VASCULAR PRODUCTS) ×2 IMPLANT
GLIDESHEATH SLEND SS 6F .021 (SHEATH) ×2 IMPLANT
GUIDEWIRE INQWIRE 1.5J.035X260 (WIRE) ×1 IMPLANT
INQWIRE 1.5J .035X260CM (WIRE) ×2
KIT ENCORE 26 ADVANTAGE (KITS) ×2 IMPLANT
KIT HEART LEFT (KITS) ×2 IMPLANT
PACK CARDIAC CATHETERIZATION (CUSTOM PROCEDURE TRAY) ×2 IMPLANT
TRANSDUCER W/STOPCOCK (MISCELLANEOUS) ×2 IMPLANT
TUBING CIL FLEX 10 FLL-RA (TUBING) ×2 IMPLANT
VALVE GUARDIAN II ~~LOC~~ HEMO (MISCELLANEOUS) ×2 IMPLANT
WIRE ASAHI FIELDER XT 190CM (WIRE) ×2 IMPLANT
WIRE ASAHI PROWATER 180CM (WIRE) ×2 IMPLANT
WIRE GUIDE ASAHI EXTENSION 165 (WIRE) ×2 IMPLANT

## 2017-04-24 NOTE — ED Notes (Signed)
Titrated Nitroglycerin drip to 33mcg/min per protocol.

## 2017-04-24 NOTE — ED Notes (Addendum)
Titrated Nitroglycerin drip to 53mcg/min per protocol.

## 2017-04-24 NOTE — ED Notes (Signed)
Titrated Nitroglycerin drip to 19mcg/min per protocol.

## 2017-04-24 NOTE — Progress Notes (Addendum)
[facility] for heparin Indication: chest pain/ACS, r/o PE  Heparin Dosing Weight: 88.5 kg   Assessment: 29 yof presenting with CP. Pharmacy consulted to dose heparin for ACS - currently already started on IV heparin at [facility] at 1400 units/hr at 0450 this AM. History of PE noted - not on anticoagulation PTA. Stat ECHO ordered per Cards and cath planned for later today. CBC wnl, troponin elevated. No bleed documented.  Goal of Therapy:  Heparin level 0.3-0.7 units/ml Monitor platelets by anticoagulation protocol: Yes   Plan:  Already started on heparin at 1400 units/hr at [facility] Check stat heparin level and adjust as needed Daily heparin level/CBC Monitor s/sx bleeding F/u PE r/o, Cards planning cath later today   Elicia Lamp, PharmD, BCPS Clinical Pharmacist 04/24/2017 11:47 AM   ADDENDUM:  Heparin level slightly supratherapeutic at 0.75. No issues with bleeding or IV line documented. Patient already taken to cath lab - will f/u Cardiology plans post-cath.  Elicia Lamp, PharmD, BCPS Clinical Pharmacist Rx Phone # for today: [phone] After 3:30PM, please call Main Rx: [phone] 04/24/2017 2:03 PM

## 2017-04-24 NOTE — Interval H&P Note (Signed)
Cath Lab Visit (complete for each Cath Lab visit)  Clinical Evaluation Leading to the Procedure:   ACS: Yes.    Non-ACS:    Anginal Classification: CCS IV  Anti-ischemic medical therapy: Minimal Therapy (1 class of medications)  Non-Invasive Test Results: No non-invasive testing performed  Prior CABG: No previous CABG    Apical wall motion abnormality.  History and Physical Interval Note:  04/24/2017 12:35 PM  Kyle Patel.  has presented today for surgery, with the diagnosis of NSTEMI  The various methods of treatment have been discussed with the patient and family. After consideration of risks, benefits and other options for treatment, the patient has consented to  Procedure(s): LEFT HEART CATH AND CORONARY ANGIOGRAPHY (N/A) as a surgical intervention .  The patient's history has been reviewed, patient examined, no change in status, stable for surgery.  I have reviewed the patient's chart and labs.  Questions were answered to the patient's satisfaction.     Larae Grooms

## 2017-04-24 NOTE — ED Notes (Signed)
ECHO tech at bedside.

## 2017-04-24 NOTE — ED Notes (Signed)
Bed placement contacted regarding pts bed request, Robin in placement made aware pt has order for admission but no bed request. Shirlean Mylar advised she would get bed request put in for patient at this time

## 2017-04-24 NOTE — ED Notes (Signed)
Titrated Nitroglycerin Drip to 20mcg/min per protocol.

## 2017-04-24 NOTE — ED Notes (Addendum)
Notified EDP Tomi Bamberger, I[place] regarding abnormally high Troponin.

## 2017-04-24 NOTE — ED Notes (Signed)
Titrated Nitroglycerin drip to 11mcg/min per protocol.

## 2017-04-24 NOTE — H&P (Addendum)
Cardiology Admission History and Physical:   Patient ID: Kyle Patel.; MRN: [id number]; DOB: Mar 17, 1946   Admission date: 04/24/2017  Primary Care Provider: [name], MD Primary Cardiologist: New to [facility] (may be followed at [facility])  Chief Complaint:  CP and SOB  Patient Profile:   Kyle Patel. is a 71 y.o. male with a history of pulmonary embolism in 2013, hypertension, chronic kidney disease and COPD presented from  [facility] with non-STEMI.  No prior history of MI.  Never seen by any cardiologist.  Remote history of tobacco smoking, quit approximately 20 years ago.  Father has history of CAD.  Mother with some kind of heart disease.  History of Present Illness:   Kyle Patel was in usual state of health up until yesterday when he started to having shortness of breath and substernal chest pain while walking.  Did not relieved with rest.  He describes the pain as a dull achy sensation.  Later it intensified as chest pressure with more than 10 out of 10.  He was evaluated in ER at [facility].  Noted to have troponin of 10.82.  No regular exercise.  He denies associated symptoms of nausea, vomiting, diaphoresis or radiation.  He denies orthopnea, PND, syncope, lower extremity edema or melena.  Currently he is having 5 out of 10 chest discomfort on IV nitro and IV heparin. Breathing and pain worse with deep breath. He states that this is symptom is similar to prior pulmonary embolism.    Creatinine 1.71.  Chest x-ray with vascular congestion. BNP normal.   EKG showed sinus tachycardia with LBBB and poor R wave progression- personally reviewed.     Past Medical History:  Diagnosis Date  . Arthritis   . 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 COPD (chronic obstructive pulmonary disease) ([place])   . 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 Hypercholesteremia   . Hypertension   . Pulmonary embolism ([place]) 10/2011    Past Surgical History:  Procedure  Laterality Date  . ABDOMINAL SURGERY     colon polyp removed 2007- sepsis, coma for 4 months per pt  . COLONOSCOPY  03/22/2010   RMR: 1. Normal rectum 2. Diminutive polyp at the mouth of the ileocecal valve  status post cold biopsy removal. Remaideer of the colonic mucosa and terminal ileum mucosa appeared normal.   . COLONOSCOPY N/A 06/25/2015   Procedure: COLONOSCOPY;  Surgeon: Daneil Dolin, MD;  Location: [facility];  Service: Endoscopy;  Laterality: N/A;  200-rescheduled 1/30 per [place]   . LUMBAR LAMINECTOMY  06/02/2012  . LUMBAR LAMINECTOMY/DECOMPRESSION MICRODISCECTOMY  06/02/2012   Procedure: LUMBAR LAMINECTOMY/DECOMPRESSION MICRODISCECTOMY 1 LEVEL;  Surgeon: Floyce Stakes, MD;  Location: [facility] OR[facility];  Service: Neurosurgery;  Laterality: Bilateral;  Bilateral Lumbar five-sacral one Foraminotomy and microdiskectomy      Medications Prior to Admission: Prior to Admission medications   Medication Sig Start Date End Date Taking? Authorizing Provider  aspirin 81 MG tablet Take 81 mg by mouth daily.   Yes [provider]  atorvastatin (LIPITOR) 20 MG tablet Take 20 mg by mouth daily. 03/23/14  Yes [provider]  cholecalciferol (VITAMIN D) 1000 UNITS tablet Take 1,000 Units by mouth daily.   Yes [provider]  diltiazem (CARDIZEM CD) 240 MG 24 hr capsule Take 240 mg by mouth daily.  06/19/14  Yes [provider]  hydrochlorothiazide (HYDRODIURIL) 25 MG tablet Take 25 mg by mouth daily.  05/10/14  Yes [provider]  ibuprofen (ADVIL,MOTRIN) 400 MG tablet Take 400 mg by mouth every 8 (eight) hours as needed for mild pain.    Yes [provider]  LYRICA 75 MG capsule Take 75 mg by mouth every evening. 04/10/17  Yes [provider]  Multiple Vitamins-Minerals (CENTRUM PO) Take 1 tablet by mouth every morning.   Yes [provider]     Allergies:    Allergies  Allergen Reactions  . Levaquin [Levofloxacin]  Itching    Social History:   Social History   Socioeconomic History  . Marital status: Married    Spouse name: Not on file  . Number of children: 1  . Years of education: Not on file  . Highest education level: Not on file  Social Needs  . Financial resource strain: Not on file  . Food insecurity - worry: Not on file  . Food insecurity - inability: Not on file  . Transportation needs - medical: Not on file  . Transportation needs - non-medical: Not on file  Occupational History  . Occupation: retired  Tobacco Use  . Smoking status: Former Smoker    Packs/day: 0.10    Years: 20.00    Pack years: 2.00    Types: Cigarettes    Last attempt to quit: 04/25/2004    Years since quitting: 13.0  . Smokeless tobacco: Never Used  Substance and Sexual Activity  . Alcohol use: No  . Drug use: No  . Sexual activity: Not on file  Other Topics Concern  . Not on file  Social History Narrative   Patient is right handed.   Patient drinks caffeine occasionally    Family History:   The patient's family history includes Arthritis in his unknown relative; Colon cancer (age of onset: 2) in his sister; Diabetes in his mother; Heart attack in his mother; Stroke in his father. There is no history of Neuropathy.    ROS:  Please see the history of present illness.  All other ROS reviewed and negative.     Physical Exam/Data:   Vitals:   04/24/17 0930 04/24/17 0945 04/24/17 1000 04/24/17 1015  BP: 108/80 109/75 108/70 122/76  Pulse: (!) 104 (!) 103 (!) 105 (!) 105  Resp:      Temp:      TempSrc:      SpO2: 92% 93%    Weight:      Height:       No intake or output data in the 24 hours ending 04/24/17 1046 Filed Weights   04/24/17 0331  Weight: 195 lb (88.5 kg)   Body mass index is 27.98 kg/m.  General:  Well nourished, well developed, in no acute distress HEENT: normal Lymph: no adenopathy Neck: no JVD Endocrine:  No thryomegaly Vascular: No carotid bruits; FA pulses 2+  bilaterally without bruits  Cardiac:  normal S1, S2; RRR; no murmur  Lungs:  clear to auscultation bilaterally, no wheezing, rhonchi or rales  Abd: soft, nontender, no hepatomegaly  Ext: no  edema Musculoskeletal:  No deformities, BUE and BLE strength normal and equal Skin: warm and dry  Neuro:  CNs 2-12 intact, no focal abnormalities noted Psych:  Normal affect     Relevant CV Studies: Echo in 2013 Study Conclusions  Left ventricle: The cavity size was normal. There was mild concentric hypertrophy. Systolic function was normal. The estimated ejection fraction was in the range of 55% to 60%. Wall motion was normal; there were no regional  wall motion abnormalities.  Laboratory Data:  Chemistry Recent Labs  Lab 04/24/17 0402  NA 135  K 4.2  CL 102  CO2 23  GLUCOSE 135*  BUN 30*  CREATININE 1.71*  CALCIUM 9.4  GFRNONAA 39*  GFRAA 45*  ANIONGAP 10    Recent Labs  Lab 04/24/17 0402  PROT 8.1  ALBUMIN 4.1  AST 74*  ALT 26  ALKPHOS 81  BILITOT 1.2   Hematology Recent Labs  Lab 04/24/17 0402  WBC 15.4*  RBC 4.44  HGB 13.5  HCT 41.9  MCV 94.4  MCH 30.4  MCHC 32.2  RDW 13.9  PLT 177   Cardiac Enzymes Recent Labs  Lab 04/24/17 0402  TROPONINI 10.82*    Recent Labs  Lab 04/24/17 0414  TROPIPOC 9.09*    BNP Recent Labs  Lab 04/24/17 0402  BNP 99.0    DDimer No results for input(s): DDIMER in the last 168 hours.  Radiology/Studies:  Dg Chest Port 1 View  Result Date: 04/24/2017 CLINICAL DATA:  Chest pain, onset yesterday. EXAM: PORTABLE CHEST 1 VIEW COMPARISON:  05/21/2012 FINDINGS: The heart is enlarged. Vascular congestion with central bronchial thickening. Vague retrocardiac opacity with obscuration of the left hemidiaphragm. No pneumothorax. 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 IMPRESSION: Cardiomegaly with vascular congestion. Vague retrocardiac opacity may be secondary to soft tissue attenuation or pleural fluid. Increased  central bronchial thickening. Electronically Signed   By: Jeb Levering M.D.   On: 04/24/2017 05:29    Assessment and Plan:   1. NSTEMI - Troponin 10.82. Ongoing chest pain on IV nitro and IV heparin. Worse with deep breath. Similar to prior PE.  - STAT echo to look for WM abnormality and RV. Plan cath later today. Check lipids.   2. CKD - follow closely. Scr was 1.3 in 2013. Today 1.7. Gental hydration.   3. HTN - Hold HCTZ and Cardizem. Nitro Gtt.     Severity of Illness: The appropriate patient status for this patient is INPATIENT. Inpatient status is judged to be reasonable and necessary in order to provide the required intensity of service to ensure the patient's safety. The patient's presenting symptoms, physical exam findings, and initial radiographic and laboratory data in the context of their chronic comorbidities is felt to place them at high risk for further clinical deterioration. Furthermore, it is not anticipated that the patient will be medically stable for discharge from the hospital within 2 midnights of admission. The following factors support the patient status of inpatient.   " The patient's presenting symptoms include  Chest pain and shortness of breath . " The worrisome physical exam findings include N/A " The initial radiographic and laboratory data are worrisome because of - Elevated Troponin, pending stat echo " The chronic co-morbidities include - prior PE, copd   * I certify that at the point of admission it is my clinical judgment that the patient will require inpatient hospital care spanning beyond 2 midnights from the point of admission due to high intensity of service, high risk for further deterioration and high frequency of surveillance required.*    For questions or updates, please contact [place] Please consult [email or web address] for contact info under Cardiology/STEMI.    Jarrett Soho, PA  04/24/2017 10:46 AM   I have  examined the patient and reviewed assessment and plan and discussed with patient.  Agree with above as stated.  I personally reviewed the echo which showed an apical wall motion abnormality.  This  is more consistent with ACS rather than PE as the source of his elevated troponin, despite the pain feeling just like his PE.  The risks and benefits of cath were explained to the patient and he is willing to proceed.  Family was in the room as well.  He has some CRI so dye will need to be minimized.  He is a candidate for longterm DAPT.  ECG suggestive of recent anterior injury.  Larae Grooms

## 2017-04-24 NOTE — ED Notes (Signed)
Cardiology paged to Dr Tomi Bamberger

## 2017-04-24 NOTE — ED Notes (Signed)
Titrated Nitroglycerin drip to 70mcg/min per protocol.

## 2017-04-24 NOTE — Progress Notes (Signed)
TR BAND REMOVAL  LOCATION:    Right radial  DEFLATED PER PROTOCOL:    Yes.    TIME BAND OFF / DRESSING APPLIED:    1725p   SITE UPON ARRIVAL:    Level 0  SITE AFTER BAND REMOVAL:    Level < 1cm hematoma  CIRCULATION SENSATION AND MOVEMENT:    Within Normal Limits   Yes.    COMMENTS:  Presssure dressing to hematoma.  Receiving nurse aware.

## 2017-04-24 NOTE — ED Notes (Signed)
Pt transferred to [facility] from [place] via [facility]. Pt seen at [facility] ED for cp, found to have elevated troponin. Started on heparin and nitro pta to [facility]. Pt a/ox4, resp e/u, nad.

## 2017-04-24 NOTE — ED Provider Notes (Addendum)
[facility] EMERGENCY DEPARTMENT Provider Note   CSN: [id number] Arrival date & time: 04/24/17  7035  Time seen 03:45 AM   History   Chief Complaint Chief Complaint  Patient presents with  . Chest Pain    HPI Kyle Patel. is a 71 y.o. male.  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  He states it "hurts".  He then clarifies it as aching and dull.  He states it has been there constantly.  He states it started in the left lateral abdomen and then moved into his upper abdomen and into his chest.  He has had some shortness of breath but denies nausea or vomiting.  He denies diaphoresis.  He states laying down makes it hurt more, sitting up makes it feel better.  He denies any swelling of his legs or diarrhea.  He states he is constipated but that is chronic.  He states he has never had this pain before and states it is different from when he had his pulmonary embolism.  He states he took an Alka-Seltzer about 8 PM and that seemed to help.  He denies prior history of heart problems.  He states his father died at age 75 and he had had a MI before.  His mother had some type of heart disease.  PCP [name], MD   Past Medical History:  Diagnosis Date  . Arthritis   . 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 COPD (chronic obstructive pulmonary disease) ([place])   . 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 Hypercholesteremia   . Hypertension   . Pulmonary embolism ([place]) 10/2011    Patient Active Problem List   Diagnosis Date Noted  . NSTEMI (non-ST elevated myocardial infarction) ([place]) 04/24/2017  . History of colonic polyps   . Diverticulosis of colon without hemorrhage   . Family history of colon cancer 06/11/2015  . Encounter for screening colonoscopy 06/11/2015  . 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 Pulmonary  embolism ([place]) 12/10/2012  . Pain in joint, shoulder region 07/21/2012  . Nocturnal muscle cramps 01/19/2012  . Hypoxemia 11/10/2011  . Hypertension 11/10/2011  . CKD (chronic kidney disease) 11/10/2011    Past Surgical History:  Procedure Laterality Date  . ABDOMINAL SURGERY     colon polyp removed 2007- sepsis, coma for 4 months per pt  . COLONOSCOPY  03/22/2010   RMR: 1. Normal rectum 2. Diminutive polyp at the mouth of the ileocecal valve  status post cold biopsy removal. Remaideer of the colonic mucosa and terminal ileum mucosa appeared normal.   . COLONOSCOPY N/A 06/25/2015   Procedure: COLONOSCOPY;  Surgeon: Daneil Dolin, MD;  Location: [facility];  Service: Endoscopy;  Laterality: N/A;  200-rescheduled 1/30 per G[place]   . LUMBAR LAMINECTOMY  06/02/2012  . LUMBAR LAMINECTOMY/DECOMPRESSION MICRODISCECTOMY  06/02/2012   Procedure: LUMBAR LAMINECTOMY/DECOMPRESSION MICRODISCECTOMY 1 LEVEL;  Surgeon: Floyce Stakes, MD;  Location: [facility] ORS;  Service: Neurosurgery;  Laterality: Bilateral;  Bilateral Lumbar five-sacral one Foraminotomy and microdiskectomy        Home Medications    Prior to Admission medications   Medication Sig Start Date End Date Taking? Authorizing Provider  aspirin 81 MG tablet Take 81 mg by mouth daily.    [provider]  atorvastatin (LIPITOR) 20 MG tablet Take 20 mg by mouth  daily. 03/23/14   [provider]  cholecalciferol (VITAMIN D) 1000 UNITS tablet Take 1,000 Units by mouth daily.    [provider]  diltiazem (CARDIZEM CD) 240 MG 24 hr capsule Take 1 capsule by mouth daily. 06/19/14   [provider]  hydrochlorothiazide (HYDRODIURIL) 25 MG tablet Take 25 mg by mouth daily. 05/10/14   [provider]  ibuprofen (ADVIL,MOTRIN) 400 MG tablet Take 400 mg by mouth every 8 (eight) hours as needed.    [provider]  Multiple Vitamins-Minerals (CENTRUM PO) Take 1 tablet by mouth every morning.     [provider]  polyethylene glycol-electrolytes (TRILYTE) 420 g solution Take 4,000 mLs by mouth as directed. 06/11/15   Rourk, Cristopher Estimable, MD  Specialty Vitamins Products (MAGNESIUM, AMINO ACID CHELATE,) 133 MG tablet Take 1 tablet by mouth daily. OTC    [provider]  Thiamine HCl (VITAMIN B-1) 250 MG tablet Take 250 mg by mouth daily.    [provider]    Family History Family History  Problem Relation Age of Onset  . Heart attack Mother   . Diabetes Mother   . Stroke Father   . Arthritis Unknown   . Colon cancer Sister 64       Estimated age of onset; just found out about it in 59.  Marland Kitchen Neuropathy Neg Hx     Social History Social History   Tobacco Use  . Smoking status: Former Smoker    Packs/day: 0.10    Years: 20.00    Pack years: 2.00    Types: Cigarettes    Last attempt to quit: 04/25/2004    Years since quitting: 13.0  . Smokeless tobacco: Never Used  Substance Use Topics  . Alcohol use: No  . Drug use: No  employed   Allergies   Levaquin [levofloxacin]   Review of Systems Review of Systems  All other systems reviewed and are negative.    Physical Exam Updated Vital Signs BP (!) 155/82 (BP Location: Left Arm)   Pulse (!) 103   Temp 99 F (37.2 C) (Oral)   Resp (!) 30   Ht 5\' 10"  (1.778 m)   Wt 88.5 kg (195 lb)   SpO2 93%   BMI 27.98 kg/m   Vital signs normal except hypertension and tachypnea   Physical Exam  Constitutional: He is oriented to person, place, and time. He appears well-developed and well-nourished.  Non-toxic appearance. He does not appear ill. No distress.  HENT:  Head: Normocephalic and atraumatic.  Right Ear: External ear normal.  Left Ear: External ear normal.  Nose: Nose normal. No mucosal edema or rhinorrhea.  Mouth/Throat: Oropharynx is clear and moist and mucous membranes are normal. No dental abscesses or uvula swelling.  Eyes: Conjunctivae and EOM are normal. Pupils are equal, round, and  reactive to light.  Neck: Normal range of motion and full passive range of motion without pain. Neck supple.  Cardiovascular: Normal rate, regular rhythm and normal heart sounds. Exam reveals no gallop and no friction rub.  No murmur heard. Pulmonary/Chest: Effort normal and breath sounds normal. No respiratory distress. He has no wheezes. He has no rhonchi. He has no rales. He exhibits no tenderness and no crepitus.  Area of pain    Abdominal: Soft. Normal appearance and bowel sounds are normal. He exhibits no distension. There is tenderness in the epigastric area. There is no rebound and no guarding.    Musculoskeletal: Normal range of motion. He exhibits  no edema or tenderness.  Moves all extremities well.   Neurological: He is alert and oriented to person, place, and time. He has normal strength. No cranial nerve deficit.  Skin: Skin is warm, dry and intact. No rash noted. No erythema. No pallor.  Psychiatric: His speech is normal and behavior is normal. His mood appears anxious.  Nursing note and vitals reviewed.    ED Treatments / Results  Labs (all labs ordered are listed, but only abnormal results are displayed) Results for orders placed or performed during the hospital encounter of 04/24/17  CBC  Result Value Ref Range   WBC 15.4 (H) 4.0 - 10.5 K/uL   RBC 4.44 4.22 - 5.81 MIL/uL   Hemoglobin 13.5 13.0 - 17.0 g/dL   HCT 41.9 39.0 - 52.0 %   MCV 94.4 78.0 - 100.0 fL   MCH 30.4 26.0 - 34.0 pg   MCHC 32.2 30.0 - 36.0 g/dL   RDW 13.9 11.5 - 15.5 %   Platelets 177 150 - 400 K/uL  Comprehensive metabolic panel  Result Value Ref Range   Sodium 135 135 - 145 mmol/L   Potassium 4.2 3.5 - 5.1 mmol/L   Chloride 102 101 - 111 mmol/L   CO2 23 22 - 32 mmol/L   Glucose, Bld 135 (H) 65 - 99 mg/dL   BUN 30 (H) 6 - 20 mg/dL   Creatinine, Ser 1.71 (H) 0.61 - 1.24 mg/dL   Calcium 9.4 8.9 - 10.3 mg/dL   Total Protein 8.1 6.5 - 8.1 g/dL   Albumin 4.1 3.5 - 5.0 g/dL   AST 74 (H) 15 -  41 U/L   ALT 26 17 - 63 U/L   Alkaline Phosphatase 81 38 - 126 U/L   Total Bilirubin 1.2 0.3 - 1.2 mg/dL   GFR calc non Af Amer 39 (L) >60 mL/min   GFR calc Af Amer 45 (L) >60 mL/min   Anion gap 10 5 - 15  Lipase, blood  Result Value Ref Range   Lipase 33 11 - 51 U/L  Troponin I  Result Value Ref Range   Troponin I 10.82 (HH) <0.03 ng/mL  Urinalysis, Routine w reflex microscopic  Result Value Ref Range   Color, Urine YELLOW YELLOW   APPearance CLEAR CLEAR   Specific Gravity, Urine 1.018 1.005 - 1.030   pH 5.0 5.0 - 8.0   Glucose, UA NEGATIVE NEGATIVE mg/dL   Hgb urine dipstick SMALL (A) NEGATIVE   Bilirubin Urine NEGATIVE NEGATIVE   Ketones, ur NEGATIVE NEGATIVE mg/dL   Protein, ur 100 (A) NEGATIVE mg/dL   Nitrite NEGATIVE NEGATIVE   Leukocytes, UA NEGATIVE NEGATIVE   RBC / HPF 0-5 0 - 5 RBC/hpf   WBC, UA 0-5 0 - 5 WBC/hpf   Bacteria, UA RARE (A) NONE SEEN   Squamous Epithelial / LPF NONE SEEN NONE SEEN  Protime-INR  Result Value Ref Range   Prothrombin Time 13.5 11.4 - 15.2 seconds   INR 1.04   APTT  Result Value Ref Range   aPTT 30 24 - 36 seconds  Brain natriuretic peptide  Result Value Ref Range   B Natriuretic Peptide 99.0 0.0 - 100.0 pg/mL  I-stat troponin, ED  Result Value Ref Range   Troponin i, poc 9.09 (HH) 0.00 - 0.08 ng/mL   Comment NOTIFIED PHYSICIAN    Comment 3           Laboratory interpretation all normal except leukocytosis, positive troponin, renal insufficiency    EKG  EKG Interpretation  Date/Time:  Friday April 24 2017 03:37:24 EST Ventricular Rate:  101 PR Interval:    QRS Duration: 129 QT Interval:  332 QTC Calculation: 431 R Axis:   77 Text Interpretation:  Sinus tachycardia Ventricular premature complex Nonspecific intraventricular conduction delay Lateral infarct, acute (LAD) Anterior infarct, old Electrode noise Confirmed by Rolland Porter [phone]) on 04/24/2017 3:44:00 AM        EKG Interpretation  Date/Time:  Friday  April 24 2017 06:15:03 EST Ventricular Rate:  110 PR Interval:    QRS Duration: 127 QT Interval:  354 QTC Calculation: 479 R Axis:   83 Text Interpretation:  Sinus tachycardia Nonspecific intraventricular conduction delay Lateral infarct, acute (LAD) Anterior infarct, old Confirmed by Rolland Porter [phone]) on 04/24/2017 6:58:05 AM        Radiology Dg Chest Port 1 View  Result Date: 04/24/2017 CLINICAL DATA:  Chest pain, onset yesterday. EXAM: PORTABLE CHEST 1 VIEW COMPARISON:  05/21/2012 FINDINGS: The heart is enlarged. Vascular congestion with central bronchial thickening. Vague retrocardiac opacity with obscuration of the left hemidiaphragm. No pneumothorax. 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 IMPRESSION: Cardiomegaly with vascular congestion. Vague retrocardiac opacity may be secondary to soft tissue attenuation or pleural fluid. Increased central bronchial thickening. Electronically Signed   By: Jeb Levering M.D.   On: 04/24/2017 05:29    Procedures .Critical Care Performed by: Rolland Porter, MD Authorized by: Rolland Porter, MD   Critical care provider statement:    Critical care time (minutes):  40   Critical care was necessary to treat or prevent imminent or life-threatening deterioration of the following conditions:  Cardiac failure   Critical care was time spent personally by me on the following activities:  Discussions with consultants, examination of patient, evaluation of patient's response to treatment, obtaining history from patient or surrogate, ordering and review of laboratory studies, ordering and review of radiographic studies and re-evaluation of patient's condition    (including critical care time)  Medications Ordered in ED Medications  nitroGLYCERIN (NITROGLYN) 2 % ointment 1 inch (1 inch Topical Given 04/24/17 0439)  aspirin tablet 325 mg (325 mg Oral Given 04/24/17 0437)  heparin ADULT infusion 100 units/mL (25000 units/249mL sodium chloride  0.45%) (16 Units/kg/hr  88.5 kg Intravenous New Bag/Given 04/24/17 0450)  nitroGLYCERIN 50 mg in dextrose 5 % 250 mL (0.2 mg/mL) infusion (95 mcg/min Intravenous Rate/Dose Change 04/24/17 0631)     Initial Impression / Assessment and Plan / ED Course  I have reviewed the triage vital signs and the nursing notes.  Pertinent labs & imaging results that were available during my care of the patient were reviewed by me and considered in my medical decision making (see chart for details).   Initially full dose aspirin and nitroglycerin paste was ordered for the patient.  Laboratory testing was done.  Patient's first troponin is extremely positive.  His nitroglycerin paste was stopped and he was started on nitroglycerin drip and heparin drip.  Cardiology consult was ordered.   4:43 AM Dr. [place] Lions, cardiology, accepts in transfer to [facility] to stepdown, agrees with IV NTG and heparin. Attending will be Dr Marlou Porch.   04:57 AM Dr [place] Lions reviewed his EKG and agrees it doesn't meet STEMI criteria.  5:15 AM patient states his pain is improving.  He has on the nitroglycerin drip.  05:50 AM Dr Clute Lions, advised there were 12 patients waiting for step down beds, he feels patient should go to the ED so he can get cardiac  cath this morning.   05:53 AM [place], charge nurse at [facility] ED informed of transfer  05:55 AM Dr Stark Jock, ED physician accepts in transfer to [facility] ED  Final Clinical Impressions(s) / ED Diagnoses   Final diagnoses:  NSTEMI (non-ST elevated myocardial infarction) [facility])    Plan transfer to [facility] for admission   Rolland Porter, MD, Barbette Or, MD 04/24/17 4540    Rolland Porter, [place] 04/24/17 [phone]

## 2017-04-24 NOTE — ED Notes (Signed)
6E made aware that cath lab is ready for patient and he will come to their unit after cath is done.

## 2017-04-24 NOTE — ED Notes (Signed)
Titrated Nitroglycerin drip to 55mcg/min per protocol.

## 2017-04-24 NOTE — ED Notes (Signed)
Titrated Nitroglycerin drip to 66mcg/min per protocol.

## 2017-04-24 NOTE — ED Notes (Signed)
CRITICAL VALUE ALERT  Critical Value:  Troponin 8.08  Date & Time Notied:  04/24/2017  Provider Notified: Dr. Vanita Panda   Orders Received/Actions taken: Pt being transported to cath lab.

## 2017-04-24 NOTE — Progress Notes (Signed)
  Echocardiogram 2D Echocardiogram has been performed.  Kyle Patel 04/24/2017, 12:07 PM

## 2017-04-24 NOTE — ED Triage Notes (Addendum)
Pt arrived from home c/o chest pain starting at 1000 yesterday. Pt initially thought it was indigestion and thought it would pass, but chest pain progressively got worse.

## 2017-04-24 NOTE — ED Notes (Addendum)
Date and time results received: 04/24/17 0507  Test: troponin Critical Value: 10.82  Name of Provider Notified: Dr. Tomi Bamberger at [phone]  Orders Received? Or Actions Taken?: no/na

## 2017-04-24 NOTE — ED Notes (Signed)
Pt transported to cath lab with this RN and Janett Billow, Therapist, sports.

## 2017-04-24 NOTE — ED Notes (Signed)
Titrated Nitroglycerin Drip to 58mcg/min per protocol and Pt's pain assessment.

## 2017-04-25 LAB — BASIC METABOLIC PANEL
ANION GAP: 8 (ref 5–15)
BUN: 26 mg/dL — AB (ref 6–20)
CALCIUM: 8.4 mg/dL — AB (ref 8.9–10.3)
CO2: 23 mmol/L (ref 22–32)
Chloride: 101 mmol/L (ref 101–111)
Creatinine, Ser: 1.75 mg/dL — ABNORMAL HIGH (ref 0.61–1.24)
GFR calc Af Amer: 44 mL/min — ABNORMAL LOW (ref >60)
GFR, EST NON AFRICAN AMERICAN: 38 mL/min — AB (ref >60)
GLUCOSE: 132 mg/dL — AB (ref 65–99)
Potassium: 4 mmol/L (ref 3.5–5.1)
SODIUM: 132 mmol/L — AB (ref 135–145)

## 2017-04-25 LAB — LIPID PANEL
Cholesterol: 130 mg/dL (ref 0–200)
HDL: 49 mg/dL (ref >40)
LDL CALC: 72 mg/dL (ref 0–99)
TRIGLYCERIDES: 47 mg/dL (ref <150)
Total CHOL/HDL Ratio: 2.7 RATIO
VLDL: 9 mg/dL (ref 0–40)

## 2017-04-25 LAB — CBC
HCT: 32.7 % — ABNORMAL LOW (ref 39.0–52.0)
Hemoglobin: 10.8 g/dL — ABNORMAL LOW (ref 13.0–17.0)
MCH: 30.2 pg (ref 26.0–34.0)
MCHC: 33 g/dL (ref 30.0–36.0)
MCV: 91.3 fL (ref 78.0–100.0)
Platelets: 178 10*3/uL (ref 150–400)
RBC: 3.58 MIL/uL — ABNORMAL LOW (ref 4.22–5.81)
RDW: 13.5 % (ref 11.5–15.5)
WBC: 13.9 10*3/uL — AB (ref 4.0–10.5)

## 2017-04-25 LAB — TROPONIN I: TROPONIN I: 7.3 ng/mL — AB (ref <0.03)

## 2017-04-25 MED ORDER — METOPROLOL SUCCINATE ER 25 MG PO TB24
12.5000 mg | ORAL_TABLET | Freq: Every day | ORAL | Status: DC
  Administered 2017-04-25: 12.5 mg via ORAL
  Filled 2017-04-25: qty 1

## 2017-04-25 NOTE — Progress Notes (Signed)
Progress Note  Patient Name: Kyle Patel. Date of Encounter: 04/25/2017  Primary Cardiologist: Irish Lack  Subjective   No chest pain or sob. He notes a little soreness in his chest like a muscle ache.  Inpatient Medications    Scheduled Meds: . aspirin  81 mg Oral Daily  . atorvastatin  80 mg Oral q1800  . cholecalciferol  1,000 Units Oral Daily  . pregabalin  75 mg Oral QPM  . sodium chloride flush  3 mL Intravenous Q12H  . ticagrelor  90 mg Oral BID   Continuous Infusions: . sodium chloride    . nitroGLYCERIN Stopped (04/25/17 0859)   PRN Meds: sodium chloride, acetaminophen, morphine injection, nitroGLYCERIN, ondansetron (ZOFRAN) IV, sodium chloride flush   Vital Signs    Vitals:   04/25/17 0600 04/25/17 0700 04/25/17 0800 04/25/17 0900  BP: 120/62 109/79 124/74 118/81  Pulse: (!) 111 (!) 111 (!) 103 100  Resp: (!) 22 20 (!) 21 19  Temp:      TempSrc:      SpO2: 94% 95% 96% 97%  Weight:      Height:        Intake/Output Summary (Last 24 hours) at 04/25/2017 1057 Last data filed at 04/25/2017 1000 Gross per 24 hour  Intake 1722.25 ml  Output 800 ml  Net 922.25 ml   Filed Weights   04/24/17 0331  Weight: 195 lb (88.5 kg)    Telemetry    nsr - Personally Reviewed  ECG    NSR with RBBB and residual ST elevation in the anteroseptal leads - Personally Reviewed  Physical Exam   GEN: No acute distress.   Neck: No JVD Cardiac: Reg tachy rhythm, no murmurs, rubs, or gallops.  Respiratory: Clear to auscultation bilaterally. GI: Soft, nontender, non-distended  MS: No edema; No deformity. Neuro:  Nonfocal  Psych: Normal affect   Labs    Chemistry Recent Labs  Lab 04/24/17 0402 04/25/17 0530  NA 135 132*  K 4.2 4.0  CL 102 101  CO2 23 23  GLUCOSE 135* 132*  BUN 30* 26*  CREATININE 1.71* 1.75*  CALCIUM 9.4 8.4*  PROT 8.1  --   ALBUMIN 4.1  --   AST 74*  --   ALT 26  --   ALKPHOS 81  --   BILITOT 1.2  --   GFRNONAA 39* 38*  GFRAA  45* 44*  ANIONGAP 10 8     Hematology Recent Labs  Lab 04/24/17 0402 04/25/17 0530  WBC 15.4* 13.9*  RBC 4.44 3.58*  HGB 13.5 10.8*  HCT 41.9 32.7*  MCV 94.4 91.3  MCH 30.4 30.2  MCHC 32.2 33.0  RDW 13.9 13.5  PLT 177 178    Cardiac Enzymes Recent Labs  Lab 04/24/17 0402 04/24/17 1104 04/24/17 1757 04/24/17 2303  TROPONINI 10.82* 8.08* 8.82* 7.30*    Recent Labs  Lab 04/24/17 0414  TROPIPOC 9.09*     BNP Recent Labs  Lab 04/24/17 0402  BNP 99.0     DDimer No results for input(s): DDIMER in the last 168 hours.   Radiology    Dg Chest Port 1 View  Result Date: 04/24/2017 CLINICAL DATA:  Chest pain, onset yesterday. EXAM: PORTABLE CHEST 1 VIEW COMPARISON:  05/21/2012 FINDINGS: The heart is enlarged. Vascular congestion with central bronchial thickening. Vague retrocardiac opacity with obscuration of the left hemidiaphragm. No pneumothorax. 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 IMPRESSION: Cardiomegaly with vascular congestion. Vague retrocardiac opacity may be  secondary to soft tissue attenuation or pleural fluid. Increased central bronchial thickening. Electronically Signed   By: Jeb Levering M.D.   On: 04/24/2017 05:29    Cardiac Studies   Left heart cath data reviewe  Patient Profile     71 y.o. male admitted with NSTEMI, s/p attempted PCI of distal LAD with dissection and successful PCI but no stenting  Assessment & Plan    1. Single vessel CAD with NSTEMI - he does not have angina but his rate is too fast. Will add beta blocker. 2. HTN - his blood pressure is a little low. Will follow.  3. Chronic renal insuff - his creatinine has not changed since his procedure. Will follow.  For questions or updates, please contact [place] Please consult [email or web address] for contact info under Cardiology/STEMI.      Signed, Cristopher Peru, MD  04/25/2017, 10:57 AM  Patient ID: [name]., male   DOB: February 08, 1946, 71 y.o.   MRN:  [id number]

## 2017-04-25 NOTE — Plan of Care (Signed)
Pt progressing well.  

## 2017-04-25 NOTE — Progress Notes (Signed)
CARDIAC REHAB PHASE I   Pt just returned from ambulating with RN, c/o some mild DOE, soreness in his chest which he states he is concerned about.  Encouraged continued ambulation as tolerated throughout the weekend. Completed MI/PCI education.  Reviewed risk factors, MI book, anti-platelet therapy, activity restrictions, ntg, exercise, heart healthy diet and phase 2 cardiac rehab. Pt verbalized understanding. Pt agrees to phase 2 cardiac rehab referral, will send to [place] per pt request. Pt to see case manager regarding new brilinta prior to discharge. Pt in bed, call bell within reach.   [place], RN, BSN 04/25/2017 12:20 PM

## 2017-04-26 LAB — CBC
HEMATOCRIT: 35.1 % — AB (ref 39.0–52.0)
HEMOGLOBIN: 11.9 g/dL — AB (ref 13.0–17.0)
MCH: 30.9 pg (ref 26.0–34.0)
MCHC: 33.9 g/dL (ref 30.0–36.0)
MCV: 91.2 fL (ref 78.0–100.0)
Platelets: 204 10*3/uL (ref 150–400)
RBC: 3.85 MIL/uL — AB (ref 4.22–5.81)
RDW: 13.5 % (ref 11.5–15.5)
WBC: 13.3 10*3/uL — AB (ref 4.0–10.5)

## 2017-04-26 MED ORDER — FUROSEMIDE 20 MG PO TABS
20.0000 mg | ORAL_TABLET | Freq: Once | ORAL | Status: AC
  Administered 2017-04-26: 20 mg via ORAL
  Filled 2017-04-26: qty 1

## 2017-04-26 MED ORDER — METOPROLOL SUCCINATE ER 25 MG PO TB24
25.0000 mg | ORAL_TABLET | Freq: Every day | ORAL | Status: DC
  Administered 2017-04-26 – 2017-04-27 (×2): 25 mg via ORAL
  Filled 2017-04-26 (×2): qty 1

## 2017-04-26 NOTE — Progress Notes (Signed)
Pt complaining of "funny feeling" in left first finger. Radial pulses intact, equal bilaterally. Cap refill <3 seconds. Digit warm and pink. Provided reassurance to patient.

## 2017-04-26 NOTE — Progress Notes (Signed)
Pt arrived on [facility] Unit; pt has been oriented to room, surroundings, and any applicable equipment. VSS.  CCMD notified of telemetry. Pt has been educated on the Falls Prevention Plan.  Pt in stable condition. 

## 2017-04-26 NOTE — Progress Notes (Signed)
Report called to 3E. Pt notified of move with family at bedside.

## 2017-04-26 NOTE — Care Management Note (Signed)
Case Management Note  Patient Details  Name: Kyle Patel. MRN: [id number] Date of Birth: 30-Mar-1946  Subjective/Objective: CM received consult for Medication assist for this 71 yo [name]. Pt is receiving Brilinta and is eligible for a $5.00 co-pay card which I given to him with explanation and opportunity for questions. CM will submit inquiry for co-pay to be delivered to weekday CM for his info. He Is aware he can ask his PCP for samples, etc. No voiced questions                 Action/Plan: CM will sign off for now but will be available should additional discharge needs arise or disposition change.    Expected Discharge Date:                  Expected Discharge Plan:     In-House Referral:     Discharge planning Services  CM Consult, Medication Assistance  Post Acute Care Choice:    Choice offered to:  Patient  DME Arranged:    DME Agency:     HH Arranged:    [place] Agency:     Status of Service:  Completed, signed off  If discussed at [employer] of Stay Meetings, dates discussed:    Additional Comments:  Delrae Sawyers, RN 04/26/2017, 9:59 AM

## 2017-04-26 NOTE — Progress Notes (Signed)
Progress Note  Patient Name: Kyle Patel. Date of Encounter: 04/26/2017  Primary Cardiologist: Alcan Border   "I fee better today", still some dyspea  Inpatient Medications    Scheduled Meds: . aspirin  81 mg Oral Daily  . atorvastatin  80 mg Oral q1800  . cholecalciferol  1,000 Units Oral Daily  . metoprolol succinate  12.5 mg Oral Daily  . pregabalin  75 mg Oral QPM  . sodium chloride flush  3 mL Intravenous Q12H  . ticagrelor  90 mg Oral BID   Continuous Infusions: . sodium chloride    . nitroGLYCERIN Stopped (04/25/17 0859)   PRN Meds: sodium chloride, acetaminophen, morphine injection, nitroGLYCERIN, ondansetron (ZOFRAN) IV, sodium chloride flush   Vital Signs    Vitals:   04/26/17 0500 04/26/17 0600 04/26/17 0700 04/26/17 0800  BP: 131/74 122/71 120/83 131/74  Pulse: 88 89 84 82  Resp: 19 (!) 21 (!) 21 19  Temp:      TempSrc:      SpO2: 95% 96% 97% 97%  Weight:      Height:        Intake/Output Summary (Last 24 hours) at 04/26/2017 0826 Last data filed at 04/25/2017 2242 Gross per 24 hour  Intake 304.65 ml  Output 200 ml  Net 104.65 ml   Filed Weights   04/24/17 0331  Weight: 195 lb (88.5 kg)    Telemetry    NSR - Personally Reviewed  ECG    NSR - Personally Reviewed  Physical Exam   GEN: No acute distress.   Neck: 7 cm JVD Cardiac: RRR, no murmurs, rubs, or gallops.  Respiratory: Clear to auscultation bilaterally with minimal basilar rales GI: Soft, nontender, non-distended  MS: No edema; No deformity. Neuro:  Nonfocal  Psych: Normal affect   Labs    Chemistry Recent Labs  Lab 04/24/17 0402 04/25/17 0530  NA 135 132*  K 4.2 4.0  CL 102 101  CO2 23 23  GLUCOSE 135* 132*  BUN 30* 26*  CREATININE 1.71* 1.75*  CALCIUM 9.4 8.4*  PROT 8.1  --   ALBUMIN 4.1  --   AST 74*  --   ALT 26  --   ALKPHOS 81  --   BILITOT 1.2  --   GFRNONAA 39* 38*  GFRAA 45* 44*  ANIONGAP 10 8     Hematology Recent Labs  Lab  04/24/17 0402 04/25/17 0530 04/26/17 0316  WBC 15.4* 13.9* 13.3*  RBC 4.44 3.58* 3.85*  HGB 13.5 10.8* 11.9*  HCT 41.9 32.7* 35.1*  MCV 94.4 91.3 91.2  MCH 30.4 30.2 30.9  MCHC 32.2 33.0 33.9  RDW 13.9 13.5 13.5  PLT 177 178 204    Cardiac Enzymes Recent Labs  Lab 04/24/17 0402 04/24/17 1104 04/24/17 1757 04/24/17 2303  TROPONINI 10.82* 8.08* 8.82* 7.30*    Recent Labs  Lab 04/24/17 0414  TROPIPOC 9.09*     BNP Recent Labs  Lab 04/24/17 0402  BNP 99.0     DDimer No results for input(s): DDIMER in the last 168 hours.   Radiology    No results found.  Cardiac Studies   none  Patient Profile     71 y.o. male admitted with NSTEMI, s/p PCI  Assessment & Plan    1. NSTEMI - will gradually uptitrate his beta blocker 2. HTN - his blood pressure is up today. Will add additional meds. 3. chronc renal insuff - no labs today. Will recheck tomorrow  4. Disp. - ambulate and transfer to tele today, home maybe tomorrow or Tuesday.  For questions or updates, please contact [place] Please consult [email or web address] for contact info under Cardiology/STEMI.      Signed, Cristopher Peru, MD  04/26/2017, 8:26 AM  Patient ID: [name]., male   DOB: Apr 20, 1946, 71 y.o.   MRN: [id number]

## 2017-04-27 ENCOUNTER — Other Ambulatory Visit: Payer: Self-pay

## 2017-04-27 ENCOUNTER — Encounter (HOSPITAL_COMMUNITY): Payer: Self-pay | Admitting: Interventional Cardiology

## 2017-04-27 DIAGNOSIS — E785 Hyperlipidemia, unspecified: Secondary | ICD-10-CM

## 2017-04-27 DIAGNOSIS — I10 Essential (primary) hypertension: Secondary | ICD-10-CM

## 2017-04-27 LAB — BASIC METABOLIC PANEL
Anion gap: 9 (ref 5–15)
BUN: 33 mg/dL — AB (ref 6–20)
CHLORIDE: 104 mmol/L (ref 101–111)
CO2: 19 mmol/L — ABNORMAL LOW (ref 22–32)
CREATININE: 1.75 mg/dL — AB (ref 0.61–1.24)
Calcium: 8.7 mg/dL — ABNORMAL LOW (ref 8.9–10.3)
GFR, EST AFRICAN AMERICAN: 44 mL/min — AB (ref >60)
GFR, EST NON AFRICAN AMERICAN: 38 mL/min — AB (ref >60)
Glucose, Bld: 155 mg/dL — ABNORMAL HIGH (ref 65–99)
POTASSIUM: 4.1 mmol/L (ref 3.5–5.1)
SODIUM: 132 mmol/L — AB (ref 135–145)

## 2017-04-27 LAB — CBC
HEMATOCRIT: 34.6 % — AB (ref 39.0–52.0)
HEMOGLOBIN: 11.6 g/dL — AB (ref 13.0–17.0)
MCH: 30.9 pg (ref 26.0–34.0)
MCHC: 33.5 g/dL (ref 30.0–36.0)
MCV: 92.3 fL (ref 78.0–100.0)
PLATELETS: 227 10*3/uL (ref 150–400)
RBC: 3.75 MIL/uL — AB (ref 4.22–5.81)
RDW: 13.7 % (ref 11.5–15.5)
WBC: 12.4 10*3/uL — ABNORMAL HIGH (ref 4.0–10.5)

## 2017-04-27 MED ORDER — ATORVASTATIN CALCIUM 80 MG PO TABS
80.0000 mg | ORAL_TABLET | Freq: Every day | ORAL | 3 refills | Status: DC
Start: 2017-04-27 — End: 2017-05-12

## 2017-04-27 MED ORDER — NITROGLYCERIN 0.4 MG SL SUBL: 0.4000 mg | SUBLINGUAL_TABLET | SUBLINGUAL | 2 refills | Status: DC | PRN

## 2017-04-27 MED ORDER — ALUM & MAG HYDROXIDE-SIMETH 200-200-20 MG/5ML PO SUSP
30.0000 mL | ORAL | Status: DC | PRN
  Administered 2017-04-27: 30 mL via ORAL
  Filled 2017-04-27: qty 30

## 2017-04-27 MED ORDER — TICAGRELOR 90 MG PO TABS: 90.0000 mg | ORAL_TABLET | Freq: Two times a day (BID) | ORAL | 2 refills | Status: DC

## 2017-04-27 MED ORDER — METOPROLOL SUCCINATE ER 25 MG PO TB24: 25.0000 mg | ORAL_TABLET | Freq: Every day | ORAL | 1 refills | Status: DC

## 2017-04-27 NOTE — Progress Notes (Signed)
Patient stated that he still feels some discomfort in right shoulder/nipple area, but he stated that is not like/similar to chest pain or PE that he had previously. He stated that Maalox and BM and passing gas helped his symptoms.  Will contine to monitor,  Elga Santy, RN

## 2017-04-27 NOTE — Discharge Summary (Signed)
Discharge Summary    Patient ID: Kyle Patel.,  MRN: [id number], DOB/AGE: 69/04/1946 69 y.o.  Admit date: 04/24/2017 Discharge date: 04/27/2017  Primary Care Provider: [name] Primary Cardiologist: Irish Lack) Long term may want [place]   Discharge Diagnoses    Active Problems:   NSTEMI (non-ST elevated myocardial infarction) [facility])   Hypertension   CKD (chronic kidney disease)   Acute anterior wall MI ([place])   Hyperlipidemia   Allergies Allergies  Allergen Reactions  . Levaquin [Levofloxacin] Itching    Diagnostic Studies/Procedures    Cath: 04/24/17  Conclusion     Dist LAD lesion is 100% stenosed. Wire dissection at the lesion due to severe tortuosity. Initial TIMI 0 flow improved to TIMI2 flow after balloon inflations. Chest pain improved at the end of the case. I don't think stent could be delivered.  Mid RCA lesion is 10% stenosed.  LV end diastolic pressure is normal.  There is no aortic valve stenosis.  Balloon angioplasty was performed using a BALLOON MAVERICK OTW 2.0X15.  Post intervention, there is a 90% residual stenosis.   Medical therapy going forward including DAPT for 12 months along with aggressive secondary prevention.  LVEF preserved.  Small apical wall motion abnormality by echo.    Anticipate discharge on Sunday if he is doing well.    TTE: 04/24/17  Study Conclusions  - Left ventricle: The cavity size was normal. Wall thickness was   increased in a pattern of mild LVH. Systolic function was normal.   The estimated ejection fraction was in the range of 55% to 60%.   There is dyskinesis of the apical myocardium. Doppler parameters   are consistent with abnormal left ventricular relaxation (grade 1   diastolic dysfunction). - Aortic valve: Trileaflet; mildly thickened, mildly calcified   leaflets. _____________   History of Present Illness     Mr. Kyle Patel is a 71 yo male with PMH of PE, HTN, CKD and COPd who  presented with shortness of breath and substernal chest pain while walking. Did not relieved with rest.  He described the pain as a dull achy sensation. Later it intensified as chest pressure with more than 10 out of 10.  He was evaluated in ER at [facility].  Noted to have troponin of 10.82.  No regular exercise. He denied associated symptoms of nausea, vomiting, diaphoresis or radiation. He denied orthopnea, PND, syncope, lower extremity edema or melena.   Creatinine 1.71.  Chest x-ray with vascular congestion. BNP normal.   EKG showed sinus tachycardia with LBBB and poor R wave progression. Given symptoms he was continued on IV heparin and nitro.   Hospital Course     Underwent cardiac cath noted above with 100% dLAD lesion with attempted PCI but developed a wire dissection. Initial TIMI 0 flow with improvement to TIMI 2 flow after balloon inflations. Plan for DAPT with ASA/Brilinta. Normal LVEDP. Follow up echo showed normal EF with dyskinesis in the apical myocardium, G1DD. Troponin peaked at 10.82. He was continued on statin, and BB. Cardizem and HCTZ was held to allow room for BB. Post cath labs showed stable Cr 1.75 and Hgb 11.6. He worked well with cardiac rehab.   Kyle Patel. was seen by Dr. Martinique and determined stable for discharge home. Follow up in the office has been arranged. Medications are listed below.   _____________  Discharge Vitals Blood pressure 137/77, pulse 87, temperature 98 F (36.7 C), temperature source Oral, resp. rate 18, height  5' 9.5" (1.765 m), weight 188 lb 12.8 oz (85.6 kg), SpO2 96 %.  Filed Weights   04/24/17 0331 04/26/17 1610 04/27/17 0416  Weight: 195 lb (88.5 kg) 191 lb 9.6 oz (86.9 kg) 188 lb 12.8 oz (85.6 kg)    Labs & Radiologic Studies    CBC Recent Labs    04/26/17 0316 04/27/17 0548  WBC 13.3* 12.4*  HGB 11.9* 11.6*  HCT 35.1* 34.6*  MCV 91.2 92.3  PLT 204 270   Basic Metabolic Panel Recent Labs    04/25/17 0530 04/27/17 0548    NA 132* 132*  K 4.0 4.1  CL 101 104  CO2 23 19*  GLUCOSE 132* 155*  BUN 26* 33*  CREATININE 1.75* 1.75*  CALCIUM 8.4* 8.7*   Liver Function Tests No results for input(s): AST, ALT, ALKPHOS, BILITOT, PROT, ALBUMIN in the last 72 hours. No results for input(s): LIPASE, AMYLASE in the last 72 hours. Cardiac Enzymes Recent Labs    04/24/17 1757 04/24/17 2303  TROPONINI 8.82* 7.30*   BNP Invalid input(s): POCBNP D-Dimer No results for input(s): DDIMER in the last 72 hours. Hemoglobin A1C No results for input(s): HGBA1C in the last 72 hours. Fasting Lipid Panel Recent Labs    04/25/17 0530  CHOL 130  HDL 49  LDLCALC 72  TRIG 47  CHOLHDL 2.7   Thyroid Function Tests No results for input(s): TSH, T4TOTAL, T3FREE, THYROIDAB in the last 72 hours.  Invalid input(s): FREET3 _____________  Dg Chest Port 1 View  Result Date: 04/24/2017 CLINICAL DATA:  Chest pain, onset yesterday. EXAM: PORTABLE CHEST 1 VIEW COMPARISON:  05/21/2012 FINDINGS: The heart is enlarged. Vascular congestion with central bronchial thickening. Vague retrocardiac opacity with obscuration of the left hemidiaphragm. No pneumothorax. 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 IMPRESSION: Cardiomegaly with vascular congestion. Vague retrocardiac opacity may be secondary to soft tissue attenuation or pleural fluid. Increased central bronchial thickening. Electronically Signed   By: Jeb Levering M.D.   On: 04/24/2017 05:29   Disposition   Pt is being discharged home today in good condition.  Follow-up Plans & Appointments    Follow-up Information    Imogene Burn, PA-C Follow up on 05/04/2017.   Specialty:  Cardiology Why:  at 11:30am for your follow up appt.  Contact information: [address] [place] [address] [phone]          Discharge Instructions    Amb Referral to Cardiac Rehabilitation   Complete by:  As directed    Diagnosis:   NSTEMI PTCA      Call MD for:  redness, tenderness, or signs of infection (pain, swelling, redness, odor or green/yellow discharge around incision site)   Complete by:  As directed    Diet - low sodium heart healthy   Complete by:  As directed    Discharge instructions   Complete by:  As directed    Radial Site Care Refer to this sheet in the next few weeks. These instructions provide you with information on caring for yourself after your procedure. Your caregiver may also give you more specific instructions. Your treatment has been planned according to current medical practices, but problems sometimes occur. Call your caregiver if you have any problems or questions after your procedure. HOME CARE INSTRUCTIONS You may shower the day after the procedure.Remove the bandage (dressing) and gently wash the site with plain soap and water.Gently pat the site dry.  Do not apply powder or lotion  to the site.  Do not submerge the affected site in water for 3 to 5 days.  Inspect the site at least twice daily.  Do not flex or bend the affected arm for 24 hours.  No lifting over 5 pounds (2.3 kg) for 5 days after your procedure.  Do not drive home if you are discharged the same day of the procedure. Have someone else drive you.  You may drive 24 hours after the procedure unless otherwise instructed by your caregiver.  What to expect: Any bruising will usually fade within 1 to 2 weeks.  Blood that collects in the tissue (hematoma) may be painful to the touch. It should usually decrease in size and tenderness within 1 to 2 weeks.  SEEK IMMEDIATE MEDICAL CARE IF: You have unusual pain at the radial site.  You have redness, warmth, swelling, or pain at the radial site.  You have drainage (other than a small amount of blood on the dressing).  You have chills.  You have a fever or persistent symptoms for more than 72 hours.  You have a fever and your symptoms suddenly get worse.  Your arm becomes pale, cool, tingly, or  numb.  You have heavy bleeding from the site. Hold pressure on the site.    PLEASE DO NOT MISS ANY DOSES OF YOUR BRILINTA/!!!!! Also keep a log of you blood pressures and bring back to your follow up appt. Please call the office with any questions.   Patients taking blood thinners should generally stay away from medicines like ibuprofen, Advil, Motrin, naproxen, and Aleve due to risk of stomach bleeding. You may take Tylenol as directed or talk to your primary doctor about alternatives.   Increase activity slowly   Complete by:  As directed       Discharge Medications     Medication List    STOP taking these medications   diltiazem 240 MG 24 hr capsule Commonly known as:  CARDIZEM CD   hydrochlorothiazide 25 MG tablet Commonly known as:  HYDRODIURIL   ibuprofen 400 MG tablet Commonly known as:  ADVIL,MOTRIN     TAKE these medications   aspirin 81 MG tablet Take 81 mg by mouth daily.   atorvastatin 80 MG tablet Commonly known as:  LIPITOR Take 1 tablet (80 mg total) by mouth daily at 6 PM. What changed:    medication strength  how much to take  when to take this   CENTRUM PO Take 1 tablet by mouth every morning.   cholecalciferol 1000 units tablet Commonly known as:  VITAMIN D Take 1,000 Units by mouth daily.   LYRICA 75 MG capsule Generic drug:  pregabalin Take 75 mg by mouth every evening.   metoprolol succinate 25 MG 24 hr tablet Commonly known as:  TOPROL-XL Take 1 tablet (25 mg total) by mouth daily. Start taking on:  04/28/2017   nitroGLYCERIN 0.4 MG SL tablet Commonly known as:  NITROSTAT Place 1 tablet (0.4 mg total) under the tongue every 5 (five) minutes x 3 doses as needed for chest pain.   ticagrelor 90 MG Tabs tablet Commonly known as:  BRILINTA Take 1 tablet (90 mg total) by mouth 2 (two) times daily.        Aspirin prescribed at discharge?  Yes High Intensity Statin Prescribed? (Lipitor 40-80mg  or Crestor 20-40mg ): Yes Beta Blocker  Prescribed? Yes For EF <40%, was ACEI/ARB Prescribed? Yes ADP Receptor Inhibitor Prescribed? (i.e. Plavix etc.-Includes Medically Managed Patients): Yes For EF <40%,  Aldosterone Inhibitor Prescribed? No: EF ok Was EF assessed during THIS hospitalization? Yes Was Cardiac Rehab II ordered? (Included Medically managed Patients): Yes   Outstanding Labs/Studies   FLP/LFTs in 6 weeks if tolerating statin increase.   Duration of Discharge Encounter   Greater than 30 minutes including physician time.  Signed, Reino Bellis NP-C 04/27/2017, 12:14 PM

## 2017-04-27 NOTE — Plan of Care (Signed)
  Coping: Level of anxiety will decrease 04/27/2017 0810 - Not Progressing by Imagene Gurney, RN

## 2017-04-27 NOTE — Progress Notes (Signed)
CARDIAC REHAB PHASE I   PRE:  Rate/Rhythm: 85 SR  BP:  Sitting: 124/76        SaO2: 95 RA  MODE:  Ambulation: 470 ft   POST:  Rate/Rhythm: 91 SR  BP:  Sitting: 140/66         SaO2: 94 RA  Pt in bed, on 2L O2 (for comfort). Pt states he has had intermittent sharp/shooting pain in his RU chest, states he receive morphine and maalox, states he received some relief with Maalox. Pt ambulated 470 ft on RA, independent, steady gait, tolerated well with no complaints other than mild DOE, states this is normal for him. HR good, pain did not increase but improved with ambulation. Reviewed MI/PCI education with pt. Phase 2 referral sent to [place]. Pt to bed per pt request after walk, call bell within reach. Pt states he is hopeful for discharge today.   [id number] Lenna Sciara, RN, BSN 04/27/2017 9:02 AM

## 2017-04-27 NOTE — Progress Notes (Signed)
Patient complained of right shoulder pain not relived by Tylenol or Morphine.  After pain medication is given and heat applied to shoulder,patient stated that pain is located in nipple area 10/10.  VS stable, oxygen saturation slightly dropped to low 90%. Patient stated that he feels like "bad indigestion". Maalox given ,EKG done, shows NSR, patient placed on [place] 2l/min. Patient rates his pain now 1/10.  Will continue  to monitor.  Debanhi Blaker, RN

## 2017-04-27 NOTE — Progress Notes (Signed)
Orders received for pt discharge.  Discharge summary printed and reviewed with pt.  Explained medication regimen, and pt had no further questions at this time.  IV removed and site remains clean, dry, intact.  Telemetry removed.  Pt in stable condition and awaiting transport. 

## 2017-04-27 NOTE — Progress Notes (Signed)
Progress Note  Patient Name: Kyle Patel. Date of Encounter: 04/27/2017  Primary Cardiologist: Casandra Doffing MD  Subjective   Had an episode of indigestion last night. Relieved with Maalox. No further chest pain or dyspnea. Ambulating in hall.   Inpatient Medications    Scheduled Meds: . aspirin  81 mg Oral Daily  . atorvastatin  80 mg Oral q1800  . cholecalciferol  1,000 Units Oral Daily  . metoprolol succinate  25 mg Oral Daily  . pregabalin  75 mg Oral QPM  . sodium chloride flush  3 mL Intravenous Q12H  . ticagrelor  90 mg Oral BID   Continuous Infusions: . sodium chloride     PRN Meds: sodium chloride, acetaminophen, alum & mag hydroxide-simeth, morphine injection, nitroGLYCERIN, ondansetron (ZOFRAN) IV, sodium chloride flush   Vital Signs    Vitals:   04/27/17 0223 04/27/17 0231 04/27/17 0309 04/27/17 0416  BP: 126/71   112/73  Pulse: (!) 107   (!) 101  Resp: 18   18  Temp:    98.3 F (36.8 C)  TempSrc:    Oral  SpO2: 92% 93% 95% 95%  Weight:    188 lb 12.8 oz (85.6 kg)  Height:        Intake/Output Summary (Last 24 hours) at 04/27/2017 1131 Last data filed at 04/27/2017 0920 Gross per 24 hour  Intake 840 ml  Output 700 ml  Net 140 ml   Filed Weights   04/24/17 0331 04/26/17 1610 04/27/17 0416  Weight: 195 lb (88.5 kg) 191 lb 9.6 oz (86.9 kg) 188 lb 12.8 oz (85.6 kg)    Telemetry    NSR - Personally Reviewed  ECG    NSR with LAFB, RBBB.  - Personally Reviewed  Physical Exam   GEN: No acute distress.   Neck: No JVD Cardiac: RRR, no murmurs, rubs, or gallops.  Respiratory: Clear to auscultation bilaterally. GI: Soft, nontender, non-distended  MS: No edema; No deformity. Neuro:  Nonfocal  Psych: Normal affect   Labs    Chemistry Recent Labs  Lab 04/24/17 0402 04/25/17 0530 04/27/17 0548  NA 135 132* 132*  K 4.2 4.0 4.1  CL 102 101 104  CO2 23 23 19*  GLUCOSE 135* 132* 155*  BUN 30* 26* 33*  CREATININE 1.71* 1.75* 1.75*    CALCIUM 9.4 8.4* 8.7*  PROT 8.1  --   --   ALBUMIN 4.1  --   --   AST 74*  --   --   ALT 26  --   --   ALKPHOS 81  --   --   BILITOT 1.2  --   --   GFRNONAA 39* 38* 38*  GFRAA 45* 44* 44*  ANIONGAP 10 8 9      Hematology Recent Labs  Lab 04/25/17 0530 04/26/17 0316 04/27/17 0548  WBC 13.9* 13.3* 12.4*  RBC 3.58* 3.85* 3.75*  HGB 10.8* 11.9* 11.6*  HCT 32.7* 35.1* 34.6*  MCV 91.3 91.2 92.3  MCH 30.2 30.9 30.9  MCHC 33.0 33.9 33.5  RDW 13.5 13.5 13.7  PLT 178 204 227    Cardiac Enzymes Recent Labs  Lab 04/24/17 0402 04/24/17 1104 04/24/17 1757 04/24/17 2303  TROPONINI 10.82* 8.08* 8.82* 7.30*    Recent Labs  Lab 04/24/17 0414  TROPIPOC 9.09*     BNP Recent Labs  Lab 04/24/17 0402  BNP 99.0     DDimer No results for input(s): DDIMER in the last 168 hours.  Radiology    No results found.  Cardiac Studies   Cardiac cath/PCI:Conclusion     Dist LAD lesion is 100% stenosed. Wire dissection at the lesion due to severe tortuosity. Initial TIMI 0 flow improved to TIMI2 flow after balloon inflations. Chest pain improved at the end of the case. I don't think stent could be delivered.  Mid RCA lesion is 10% stenosed.  LV end diastolic pressure is normal.  There is no aortic valve stenosis.  Balloon angioplasty was performed using a BALLOON MAVERICK OTW 2.0X15.  Post intervention, there is a 90% residual stenosis.   Medical therapy going forward including DAPT for 12 months along with aggressive secondary prevention.  LVEF preserved.  Small apical wall motion abnormality by echo.    Anticipate discharge on Sunday if he is doing well.    Echo: Study Conclusions  - Left ventricle: The cavity size was normal. Wall thickness was   increased in a pattern of mild LVH. Systolic function was normal.   The estimated ejection fraction was in the range of 55% to 60%.   There is dyskinesis of the apical myocardium. Doppler parameters   are consistent  with abnormal left ventricular relaxation (grade 1   diastolic dysfunction). - Aortic valve: Trileaflet; mildly thickened, mildly calcified   leaflets.  Patient Profile     71  y.o. male with history of CKD, HTN, HLD presented with NSTEMI. Largely unsuccessful attempt at PCI of the distal LAD.   Assessment & Plan    1. Apical infarction. Unsuccessful attempt at PCI with POBA. No recurrent angina. On DAPT. Continue high dose statin and beta blocker.  2. HTN controlled on Toprol. Cardizem and HCTZ on hold 3. HLD on high dose statin 4. CKD stable today.  Plan: patient is stable for DC home today.  For questions or updates, please contact [place] Please consult [email or web address] for contact info under Cardiology/STEMI.      Signed, Peter Martinique, MD  04/27/2017, 11:31 AM

## 2017-04-29 LAB — CULTURE, BLOOD (ROUTINE X 2)
Culture: NO GROWTH
Culture: NO GROWTH
Special Requests: ADEQUATE
Special Requests: ADEQUATE

## 2017-05-04 ENCOUNTER — Ambulatory Visit: Payer: Medicare Other | Admitting: Physician Assistant

## 2017-05-06 ENCOUNTER — Encounter: Payer: Self-pay | Admitting: Cardiology

## 2017-05-06 MED FILL — Tirofiban HCl in NaCl 0.9% IV Soln 5 MG/100ML (Base Equiv): INTRAVENOUS | Qty: 100 | Status: AC

## 2017-05-12 ENCOUNTER — Ambulatory Visit: Payer: Medicare Other | Admitting: Cardiology

## 2017-05-12 ENCOUNTER — Encounter: Payer: Self-pay | Admitting: Cardiology

## 2017-05-12 VITALS — BP 110/60 | HR 71 | Resp 16 | Ht 69.5 in | Wt 198.0 lb

## 2017-05-12 DIAGNOSIS — E871 Hypo-osmolality and hyponatremia: Secondary | ICD-10-CM

## 2017-05-12 DIAGNOSIS — J449 Chronic obstructive pulmonary disease, unspecified: Secondary | ICD-10-CM | POA: Diagnosis not present

## 2017-05-12 DIAGNOSIS — E785 Hyperlipidemia, unspecified: Secondary | ICD-10-CM | POA: Diagnosis not present

## 2017-05-12 DIAGNOSIS — I214 Non-ST elevation (NSTEMI) myocardial infarction: Secondary | ICD-10-CM | POA: Diagnosis not present

## 2017-05-12 DIAGNOSIS — I10 Essential (primary) hypertension: Secondary | ICD-10-CM | POA: Diagnosis not present

## 2017-05-12 DIAGNOSIS — I2510 Atherosclerotic heart disease of native coronary artery without angina pectoris: Secondary | ICD-10-CM | POA: Diagnosis not present

## 2017-05-12 MED ORDER — ATORVASTATIN CALCIUM 40 MG PO TABS: 40.0000 mg | ORAL_TABLET | Freq: Every day | ORAL | 5 refills | Status: DC

## 2017-05-12 MED ORDER — ATORVASTATIN CALCIUM 80 MG PO TABS: 80.0000 mg | ORAL_TABLET | Freq: Every day | ORAL | 3 refills | Status: DC

## 2017-05-12 NOTE — Progress Notes (Signed)
Cardiology Office Note   Date:  05/12/2017   ID:  Kyle Aber., DOB Dec 02, 1945, MRN [id number]  PCP:  [name], MD  Cardiologist:  Dr. Irish Lack     Chief Complaint  Patient presents with  . Hospitalization Follow-up      History of Present Illness: Kyle Patel. is a 71 y.o. male who presents for post hospital for NSTEMI 04/24/17 and d/c 04/27/17. He is with his twin sister today.  He has hx of HTN, CKD, hx ant wall MI, and HLD.  With cath Dist LAD lesion is 100% stenosed. Wire dissection at the lesion due to severe tortuosity. Initial TIMI 0 flow improved to TIMI2 flow after balloon inflations. Chest pain improved at the end of the case. I don't think stent could be delivered. Post intervention 90% residual stenosis  He was continued on statin, and BB. Cardizem and HCTZ was held to allow room for BB. Post cath labs showed stable Cr 1.75 and Hgb 11.6. He worked well with cardiac rehab  Echo with EF 55-60% G1DD  NEEDS lipids and hepatic in 6 weeks.   Today he has had no further chest pain, no SOB- except his normal with COPD.  Overall he feels well.  We discussed seeing MDs in [place] for his convenience also with cardiac rehab there as well.  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  He is taking his meds as prescribed. Needs refills.    Past Medical History:  Diagnosis Date  . Arthritis   . 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 COPD (chronic obstructive pulmonary disease) ([facility])   . 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 Hypercholesteremia   . Hypertension   . Pulmonary embolism ([facility]) 10/2011    Past Surgical History:  Procedure Laterality Date  . ABDOMINAL SURGERY     colon polyp removed 2007- sepsis, coma for 4 months per pt  . COLONOSCOPY  03/22/2010   RMR: 1. Normal rectum 2. Diminutive polyp at the mouth of the ileocecal valve  status  post cold biopsy removal. Remaideer of the colonic mucosa and terminal ileum mucosa appeared normal.   . COLONOSCOPY N/A 06/25/2015   Procedure: COLONOSCOPY;  Surgeon: Daneil Dolin, MD;  [facility];  Service: Endoscopy;  Laterality: N/A;  200-rescheduled 1/30 per [facility]   . CORONARY BALLOON ANGIOPLASTY N/A 04/24/2017   Procedure: CORONARY BALLOON ANGIOPLASTY;  Surgeon: Jettie Booze, MD;  Location: [facility];  Service: Cardiovascular;  Laterality: N/A;  . LEFT HEART CATH AND CORONARY ANGIOGRAPHY N/A 04/24/2017   Procedure: LEFT HEART CATH AND CORONARY ANGIOGRAPHY;  Surgeon: Jettie Booze, MD;  Location: [facility];  Service: Cardiovascular;  Laterality: N/A;  . LUMBAR LAMINECTOMY  06/02/2012  . LUMBAR LAMINECTOMY/DECOMPRESSION MICRODISCECTOMY  06/02/2012   Procedure: LUMBAR LAMINECTOMY/DECOMPRESSION MICRODISCECTOMY 1 LEVEL;  Surgeon: Floyce Stakes, MD;  Location: [facility];  Service: Neurosurgery;  Laterality: Bilateral;  Bilateral Lumbar five-sacral one Foraminotomy and microdiskectomy      Current Outpatient Medications  Medication Sig Dispense Refill  . aspirin 81 MG tablet Take 81 mg by mouth daily.    Marland Kitchen atorvastatin (LIPITOR) 80 MG tablet Take 1 tablet (80 mg total) by mouth daily at 6 PM. 30 tablet 3  . cholecalciferol (VITAMIN D) 1000  UNITS tablet Take 1,000 Units by mouth daily.    Marland Kitchen LYRICA 75 MG capsule Take 75 mg by mouth every evening.    . metoprolol succinate (TOPROL-XL) 25 MG 24 hr tablet Take 1 tablet (25 mg total) by mouth daily. 90 tablet 1  . Misc Natural Products (OSTEO BI-FLEX ADV JOINT SHIELD) TABS Take 1 tablet by mouth daily.    . Multiple Vitamins-Minerals (CENTRUM PO) Take 1 tablet by mouth every morning.    . nitroGLYCERIN (NITROSTAT) 0.4 MG SL tablet Place 1 tablet (0.4 mg total) under the tongue every 5 (five) minutes x 3 doses as needed for chest pain. 25 tablet 2  . ticagrelor (BRILINTA) 90 MG TABS tablet Take 1  tablet (90 mg total) by mouth 2 (two) times daily. 180 tablet 2   No current facility-administered medications for this visit.     Allergies:   Levaquin [levofloxacin]    Social History:  The patient  reports that he quit smoking about 13 years ago. His smoking use included cigarettes. He has a 2.00 pack-year smoking history. he has never used smokeless tobacco. He reports that he does not drink alcohol or use drugs.   Family History:  The patient's family history includes Arthritis in his unknown relative; Colon cancer (age of onset: 29) in his sister; Diabetes in his mother; Heart attack in his mother; Stroke in his father.    ROS:  General:no colds or fevers, no weight changes Skin:no rashes or ulcers HEENT:no blurred vision, no congestion CV:see HPI PUL:see HPI GI:no diarrhea constipation or melena, no indigestion GU:no hematuria, no dysuria MS:no joint pain, no claudication Neuro:no syncope, no lightheadedness Endo:no diabetes, no thyroid disease  Wt Readings from Last 3 Encounters:  05/12/17 198 lb (89.8 kg)  04/27/17 188 lb 12.8 oz (85.6 kg)  06/25/15 196 lb (88.9 kg)     PHYSICAL EXAM: VS:  BP 110/60   Pulse 71   Resp 16   Ht 5' 9.5" (1.765 m)   Wt 198 lb (89.8 kg)   SpO2 98%   BMI 28.82 kg/m  , BMI Body mass index is 28.82 kg/m. General:Pleasant affect, NAD Skin:Warm and dry, brisk capillary refill HEENT:normocephalic, sclera clear, mucus membranes moist Neck:supple, no JVD, no bruits  Heart:S1S2 RRR without murmur, gallup, rub or click Lungs:clear without rales, rhonchi, or wheezes YTK:PTWS, non tender, + BS, do not palpate liver spleen or masses Ext:no lower ext edema, 2+ pedal pulses, 2+ radial pulses Neuro:alert and oriented, MAE, follows commands, + facial symmetry    EKG:  EKG is ordered today. The ekg ordered today demonstrates SR RBBB LAFB, stable EKG.     Recent Labs: 04/24/2017: ALT 26; B Natriuretic Peptide 99.0 04/27/2017: BUN 33;  Creatinine, Ser 1.75; Hemoglobin 11.6; Platelets 227; Potassium 4.1; Sodium 132    Lipid Panel    Component Value Date/Time   CHOL 130 04/25/2017 0530   TRIG 47 04/25/2017 0530   HDL 49 04/25/2017 0530   CHOLHDL 2.7 04/25/2017 0530   VLDL 9 04/25/2017 0530   LDLCALC 72 04/25/2017 0530       Other studies Reviewed: Additional studies/ records that were reviewed today include:  Echo 04/24/17 Study Conclusions  - Left ventricle: The cavity size was normal. Wall thickness was   increased in a pattern of mild LVH. Systolic function was normal.   The estimated ejection fraction was in the range of 55% to 60%.   There is dyskinesis of the apical myocardium. Doppler parameters  are consistent with abnormal left ventricular relaxation (grade 1   diastolic dysfunction). - Aortic valve: Trileaflet; mildly thickened, mildly calcified   leaflets.  Cardiac cath 04/24/17  ASSESSMENT AND PLAN:  1.  NSTEMI with balloon angioplasty post PCI 90% stenosis - vessel with wire dissection due to severe tortuosity  No further chest pain. DAPT for 1 year.  Encouraged to go to cardiac rehab.  2.  CAD single vessel and distal.disease of that vessel.  Follow up in [place] in 8 weeks.  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   3.  HLD with muscle pain and LDL initially 73 will decrease lipitor to 40 mg for now and recheck hepatic and lipids in 6 weeks.        4.   HTN stable today on toprol BP is 110/60 will not adjust meds today.    5.  COPD stable per pt.   Current medicines are reviewed with the patient today.  The patient Has no concerns regarding medicines.  The following changes have been made:  See above Labs/ tests ordered today include:see above  Disposition:   FU:  see above  Signed, Cecilie Kicks, NP  05/12/2017 2:18 PM    [address], [place], [place] Phone: [phone]; Fax:  [phone]

## 2017-05-12 NOTE — Patient Instructions (Addendum)
Medication Instructions:   START TAKING LIPITOR  40 MG ONCE A DAY    If you need a refill on your cardiac medications before your next appointment, please call your pharmacy.  Labwork:  BMET  TODAY   RETURN IN 6 WEEKS FOR FASTING LIPIDS AND LFT  IN [place]    Testing/Procedures: NONE ORDERED  TODAY'    Follow-Up: WITH FIRST AVAILABLE PROVIDER IN [place]  FOR  AN  6 TO 8 WEEK FOLLOW UP    Any Other Special Instructions Will Be Listed Below (If Applicable).

## 2017-05-13 LAB — BASIC METABOLIC PANEL
BUN / CREAT RATIO: 9 — AB (ref 10–24)
BUN: 13 mg/dL (ref 8–27)
CO2: 19 mmol/L — ABNORMAL LOW (ref 20–29)
CREATININE: 1.38 mg/dL — AB (ref 0.76–1.27)
Calcium: 9.4 mg/dL (ref 8.6–10.2)
Chloride: 110 mmol/L — ABNORMAL HIGH (ref 96–106)
GFR, EST AFRICAN AMERICAN: 60 mL/min/{1.73_m2} (ref >59)
GFR, EST NON AFRICAN AMERICAN: 52 mL/min/{1.73_m2} — AB (ref >59)
GLUCOSE: 92 mg/dL (ref 65–99)
Potassium: 5 mmol/L (ref 3.5–5.2)
SODIUM: 141 mmol/L (ref 134–144)

## 2017-05-22 ENCOUNTER — Telehealth: Payer: Self-pay | Admitting: Interventional Cardiology

## 2017-05-22 NOTE — Telephone Encounter (Signed)
Patient calling to get a letter to return back to work. Patient had a NSTEMI on 11/30. Patient saw Cecilie Kicks, NP on 05/12/17 for post cath f/u. 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 Patient requesting to return to work on 05/25/17. Patient denies having any chest pain, SOB, or any other symptoms. Letter completed and left up front for patient to pick up.

## 2017-05-22 NOTE — Telephone Encounter (Signed)
New Message    Patient needs a letter to clear him to return to work. Please call.

## 2017-06-11 ENCOUNTER — Encounter (HOSPITAL_COMMUNITY)
Admission: RE | Admit: 2017-06-11 | Discharge: 2017-06-11 | Disposition: A | Discharge status: Home / Self Care | Payer: Medicare Other | Source: Ambulatory Visit | Attending: Cardiovascular Disease | Admitting: Cardiovascular Disease

## 2017-06-11 ENCOUNTER — Encounter (HOSPITAL_COMMUNITY): Payer: Self-pay

## 2017-06-11 VITALS — BP 144/84 | HR 93 | Ht 69.5 in | Wt 190.1 lb

## 2017-06-11 DIAGNOSIS — I214 Non-ST elevation (NSTEMI) myocardial infarction: Secondary | ICD-10-CM | POA: Diagnosis present

## 2017-06-11 DIAGNOSIS — Z955 Presence of coronary angioplasty implant and graft: Secondary | ICD-10-CM | POA: Insufficient documentation

## 2017-06-11 DIAGNOSIS — Z9861 Coronary angioplasty status: Secondary | ICD-10-CM

## 2017-06-11 NOTE — Progress Notes (Signed)
Cardiac Individual Treatment Plan  Patient Details  Name: Kyle Patel. MRN: [id number] Date of Birth: 1945/12/20 Referring Provider:     CARDIAC REHAB PHASE II ORIENTATION from 06/11/2017 in [place]  Referring Provider  Dr. Irish Lack      Initial Encounter Date:    CARDIAC REHAB PHASE II ORIENTATION from 06/11/2017 in [place]  Date  06/11/17  Referring Provider  Dr. Irish Lack      Visit Diagnosis: NSTEMI (non-ST elevated myocardial infarction) ([place])  S/P PTCA (percutaneous transluminal coronary angioplasty)  Patient's Home Medications on Admission:  Current Outpatient Medications:  .  aspirin 81 MG EC tablet, Take 81 mg by mouth daily., Disp: , Rfl:  .  atorvastatin (LIPITOR) 40 MG tablet, Take 1 tablet (40 mg total) by mouth daily at 6 PM., Disp: 30 tablet, Rfl: 5 .  cholecalciferol (VITAMIN D) 1000 UNITS tablet, Take 1,000 Units by mouth daily., Disp: , Rfl:  .  Coenzyme Q10-Vitamin E (QUNOL ULTRA COQ10) 100-150 MG-UNIT CAPS, Take 1 capsule by mouth at bedtime., Disp: , Rfl:  .  LYRICA 75 MG capsule, Take 75 mg by mouth every evening., Disp: , Rfl:  .  metoprolol succinate (TOPROL-XL) 25 MG 24 hr tablet, Take 1 tablet (25 mg total) by mouth daily., Disp: 90 tablet, Rfl: 1 .  Misc Natural Products (OSTEO BI-FLEX ADV JOINT SHIELD) TABS, Take 1 tablet by mouth daily., Disp: , Rfl:  .  Multiple Vitamins-Minerals (CENTRUM PO), Take 1 tablet by mouth every morning., Disp: , Rfl:  .  nitroGLYCERIN (NITROSTAT) 0.4 MG SL tablet, Place 1 tablet (0.4 mg total) under the tongue every 5 (five) minutes x 3 doses as needed for chest pain., Disp: 25 tablet, Rfl: 2 .  ticagrelor (BRILINTA) 90 MG TABS tablet, Take 1 tablet (90 mg total) by mouth 2 (two) times daily., Disp: 180 tablet, Rfl: 2 .  trolamine salicylate (ASPERCREME) 10 % cream, Apply 1 application topically at bedtime as needed (For arthritis pain in knee)., Disp: , Rfl:   Past  Medical History: Past Medical History:  Diagnosis Date  . Arthritis   . 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 COPD (chronic obstructive pulmonary disease) ([place])   . 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 Hypercholesteremia   . Hypertension   . Pulmonary embolism ([place]) 10/2011    Tobacco Use: Social History   Tobacco Use  Smoking Status Former Smoker  . Packs/day: 0.10  . Years: 20.00  . Pack years: 2.00  . Types: Cigarettes  . Last attempt to quit: 04/25/2004  . Years since quitting: 13.1  Smokeless Tobacco Never Used    Labs: Recent Review Scientist, physiological    Labs for ITP Cardiac and Pulmonary Rehab Latest Ref Rng & Units 04/24/2017 04/25/2017   Cholestrol 0 - 200 mg/dL - 130   LDLCALC 0 - 99 mg/dL - 72   HDL >40 mg/dL - 49   Trlycerides <150 mg/dL - 47   Hemoglobin A1c 4.8 - 5.6 % 6.4(H) -      Capillary Blood Glucose: Lab Results  Component Value Date   GLUCAP 114 (H) 11/18/2011   GLUCAP 124 (H) 11/16/2011   GLUCAP 102 (H) 11/10/2011     Exercise Target Goals: Date: 06/11/17  Exercise Program Goal: Individual exercise prescription set using results from initial 6 min walk test and THRR while considering  patient's activity barriers and safety.   Exercise Prescription Goal: Starting  with aerobic activity 30 plus minutes a day, 3 days per week for initial exercise prescription. Provide home exercise prescription and guidelines that participant acknowledges understanding prior to discharge.  Activity Barriers & Risk Stratification: Activity Barriers & Cardiac Risk Stratification - 06/11/17 0818      Activity Barriers & Cardiac Risk Stratification   Cardiac Risk Stratification  High       6 Minute Walk: 6 Minute Walk    Row Name 06/11/17 0932         6 Minute Walk   Phase  Initial     Distance  1250 feet     Distance % Change  0 %     Distance Feet Change  0 ft     Walk Time  6 minutes     # of Rest Breaks  0      MPH  2.36     METS  2.81     RPE  11     Perceived Dyspnea   11     VO2 Peak  11.51     Symptoms  No     Resting HR  93 bpm     Resting BP  144/84     Resting Oxygen Saturation   95 %     Exercise Oxygen Saturation  during 6 min walk  93 %     Max Ex. HR  121 bpm     Max Ex. BP  152/84     2 Minute Post BP  136/80        Oxygen Initial Assessment: Oxygen Initial Assessment - 06/11/17 1403      Home Oxygen   Home Oxygen Device  None    Sleep Oxygen Prescription  None    Home Exercise Oxygen Prescription  None    Home at Rest Exercise Oxygen Prescription  None      Initial 6 min Walk   Oxygen Used  None       Oxygen Re-Evaluation:   Oxygen Discharge (Final Oxygen Re-Evaluation):   Initial Exercise Prescription: Initial Exercise Prescription - 06/11/17 0800      Date of Initial Exercise RX and Referring Provider   Date  06/11/17    Referring Provider  Dr. Irish Lack      Treadmill   MPH  1.5    Grade  0    Minutes  15    METs  2      NuStep   Level  2    SPM  96    Minutes  20    METs  2.1      Prescription Details   Frequency (times per week)  3    Duration  Progress to 30 minutes of continuous aerobic without signs/symptoms of physical distress      Intensity   THRR 40-80% of Max Heartrate  [phone]    Ratings of Perceived Exertion  11-13    Perceived Dyspnea  0-4      Progression   Progression  Continue progressive overload as per policy without signs/symptoms or physical distress.      Resistance Training   Training Prescription  Yes    Weight  1    Reps  10-15       Perform Capillary Blood Glucose checks as needed.  Exercise Prescription Changes:   Exercise Comments:   Exercise Goals and Review:  Exercise Goals    Row Name 06/11/17 [id number]  Exercise Goals   Increase Physical Activity  Yes       Intervention  Provide advice, education, support and counseling about physical activity/exercise needs.;Develop an  individualized exercise prescription for aerobic and resistive training based on initial evaluation findings, risk stratification, comorbidities and participant's personal goals.       Expected Outcomes  Short Term: Attend rehab on a regular basis to increase amount of physical activity.       Increase Strength and Stamina  Yes       Intervention  Provide advice, education, support and counseling about physical activity/exercise needs.;Develop an individualized exercise prescription for aerobic and resistive training based on initial evaluation findings, risk stratification, comorbidities and participant's personal goals.       Expected Outcomes  Short Term: Increase workloads from initial exercise prescription for resistance, speed, and METs.       Able to understand and use rate of perceived exertion (RPE) scale  Yes       Intervention  Provide education and explanation on how to use RPE scale       Expected Outcomes  Short Term: Able to use RPE daily in rehab to express subjective intensity level;Long Term:  Able to use RPE to guide intensity level when exercising independently       Able to understand and use Dyspnea scale  Yes       Intervention  Provide education and explanation on how to use Dyspnea scale       Expected Outcomes  Short Term: Able to use Dyspnea scale daily in rehab to express subjective sense of shortness of breath during exertion;Long Term: Able to use Dyspnea scale to guide intensity level when exercising independently       Knowledge and understanding of Target Heart Rate Range (THRR)  Yes       Intervention  Provide education and explanation of THRR including how the numbers were predicted and where they are located for reference       Expected Outcomes  Short Term: Able to state/look up THRR       Able to check pulse independently  Yes       Intervention  Provide education and demonstration on how to check pulse in carotid and radial arteries.;Review the importance of  being able to check your own pulse for safety during independent exercise       Expected Outcomes  Long Term: Able to check pulse independently and accurately;Short Term: Able to explain why pulse checking is important during independent exercise       Understanding of Exercise Prescription  Yes       Intervention  Provide education, explanation, and written materials on patient's individual exercise prescription       Expected Outcomes  Short Term: Able to explain program exercise prescription;Long Term: Able to explain home exercise prescription to exercise independently          Exercise Goals Re-Evaluation :    Discharge Exercise Prescription (Final Exercise Prescription Changes):   Nutrition:  Target Goals: Understanding of nutrition guidelines, daily intake of sodium 1500mg , cholesterol 200mg , calories 30% from fat and 7% or less from saturated fats, daily to have 5 or more servings of fruits and vegetables.  Biometrics: Pre Biometrics - 06/11/17 0817      Pre Biometrics   Height  5' 9.5" (1.765 m)    Weight  190 lb 1.6 oz (86.2 kg)    Waist Circumference  40.5 inches    Hip Circumference  40 inches    Waist to Hip Ratio  1.01 %    BMI (Calculated)  27.68    Triceps Skinfold  12 mm    % Body Fat  26.8 %    Grip Strength  58.33 kg    Flexibility  0 in    Single Leg Stand  17 seconds        Nutrition Therapy Plan and Nutrition Goals: Nutrition Therapy & Goals - 06/11/17 1404      Personal Nutrition Goals   Personal Goal #2  Patient has changed his diet. he now eats baked meats, low sodium, low fat choices.     Additional Goals?  No      Intervention Plan   Intervention  Nutrition handout(s) given to patient.    Expected Outcomes  Short Term Goal: Understand basic principles of dietary content, such as calories, fat, sodium, cholesterol and nutrients.;Long Term Goal: Adherence to prescribed nutrition plan.       Nutrition Assessments: Nutrition Assessments -  06/11/17 1406      MEDFICTS Scores   Pre Score  38       Nutrition Goals Re-Evaluation:   Nutrition Goals Discharge (Final Nutrition Goals Re-Evaluation):   Psychosocial: Target Goals: Acknowledge presence or absence of significant depression and/or stress, maximize coping skills, provide positive support system. Participant is able to verbalize types and ability to use techniques and skills needed for reducing stress and depression.  Initial Review & Psychosocial Screening: Initial Psych Review & Screening - 06/11/17 1343      Initial Review   Current issues with  None Identified      Family Dynamics   Good Support System?  Yes      Barriers   Psychosocial barriers to participate in program  There are no identifiable barriers or psychosocial needs.      Screening Interventions   Interventions  Encouraged to exercise    Expected Outcomes  Short Term goal: Identification and review with participant of any Quality of Life or Depression concerns found by scoring the questionnaire.;Long Term goal: The participant improves quality of Life and PHQ9 Scores as seen by post scores and/or verbalization of changes       Quality of Life Scores: Quality of Life - 06/11/17 0816      Quality of Life Scores   Health/Function Pre  24.54 %    Socioeconomic Pre  29.38 %    Psych/Spiritual Pre  30 %    Family Pre  27.6 %    GLOBAL Pre  27.33 %      Scores of 19 and below usually indicate a poorer quality of life in these areas.  A difference of  2-3 points is a clinically meaningful difference.  A difference of 2-3 points in the total score of the Quality of Life Index has been associated with significant improvement in overall quality of life, self-image, physical symptoms, and general health in studies assessing change in quality of life.  PHQ-9: Recent Review Flowsheet Data    Depression screen [facility] 2/9 06/11/2017   Decreased Interest 0   Down, Depressed, Hopeless 0   PHQ - 2 Score 0    Altered sleeping 2   Tired, decreased energy 0   Change in appetite 0   Feeling bad or failure about yourself  0   Trouble concentrating 0   Moving slowly or fidgety/restless 0   Suicidal thoughts 0   PHQ-9 Score 2   Difficult doing work/chores Somewhat  difficult     Interpretation of Total Score  Total Score Depression Severity:  1-4 = Minimal depression, 5-9 = Mild depression, 10-14 = Moderate depression, 15-19 = Moderately severe depression, 20-27 = Severe depression   Psychosocial Evaluation and Intervention: Psychosocial Evaluation - 06/11/17 1410      Psychosocial Evaluation & Interventions   Interventions  Encouraged to exercise with the program and follow exercise prescription    Continue Psychosocial Services   No Follow up required       Psychosocial Re-Evaluation:   Psychosocial Discharge (Final Psychosocial Re-Evaluation):   Vocational Rehabilitation: Provide vocational rehab assistance to qualifying candidates.   Vocational Rehab Evaluation & Intervention: Vocational Rehab - 06/11/17 1359      Initial Vocational Rehab Evaluation & Intervention   Assessment shows need for Vocational Rehabilitation  No Still works part-time       Education: Education Goals: Education classes will be provided on a weekly basis, covering required topics. Participant will state understanding/return demonstration of topics presented.  Learning Barriers/Preferences: Learning Barriers/Preferences - 06/11/17 1358      Learning Barriers/Preferences   Learning Barriers  None    Learning Preferences  Group Instruction;Individual Instruction;Pictoral;Skilled Demonstration;Verbal Instruction;Video;Written Material;Computer/Internet;Audio       Education Topics: Hypertension, Hypertension Reduction -Define heart disease and high blood pressure. Discus how high blood pressure affects the body and ways to reduce high blood pressure.   Exercise and Your Heart -Discuss why it  is important to exercise, the FITT principles of exercise, normal and abnormal responses to exercise, and how to exercise safely.   Angina -Discuss definition of angina, causes of angina, treatment of angina, and how to decrease risk of having angina.   Cardiac Medications -Review what the following cardiac medications are used for, how they affect the body, and side effects that may occur when taking the medications.  Medications include Aspirin, Beta blockers, calcium channel blockers, ACE Inhibitors, angiotensin receptor blockers, diuretics, digoxin, and antihyperlipidemics.   Congestive Heart Failure -Discuss the definition of CHF, how to live with CHF, the signs and symptoms of CHF, and how keep track of weight and sodium intake.   Heart Disease and Intimacy -Discus the effect sexual activity has on the heart, how changes occur during intimacy as we age, and safety during sexual activity.   Smoking Cessation / COPD -Discuss different methods to quit smoking, the health benefits of quitting smoking, and the definition of COPD.   Nutrition I: Fats -Discuss the types of cholesterol, what cholesterol does to the heart, and how cholesterol levels can be controlled.   Nutrition II: Labels -Discuss the different components of food labels and how to read food label   Heart Parts and Heart Disease -Discuss the anatomy of the heart, the pathway of blood circulation through the heart, and these are affected by heart disease.   Stress I: Signs and Symptoms -Discuss the causes of stress, how stress may lead to anxiety and depression, and ways to limit stress.   Stress II: Relaxation -Discuss different types of relaxation techniques to limit stress.   Warning Signs of Stroke / TIA -Discuss definition of a stroke, what the signs and symptoms are of a stroke, and how to identify when someone is having stroke.   Knowledge Questionnaire Score: Knowledge Questionnaire Score -  06/11/17 1359      Knowledge Questionnaire Score   Pre Score  23/24       Core Components/Risk Factors/Patient Goals at Admission: Personal Goals and Risk Factors at  Admission - 06/11/17 1406      Core Components/Risk Factors/Patient Goals on Admission    Weight Management  Weight Maintenance    Personal Goal Other  Yes    Personal Goal  Get better, Live longer and healthier    Intervention  Attend CR 3xweek and supplement with home exercise 2 x week.     Expected Outcomes  Achieve personal goals       Core Components/Risk Factors/Patient Goals Review:    Core Components/Risk Factors/Patient Goals at Discharge (Final Review):    ITP Comments: ITP Comments    Row Name 06/11/17 1400 06/11/17 1528         ITP Comments  Mr. Steidle is tarting our CR program on Monday 06/15/17 at 8:15 on Monday and Wednesday and at 3:45 on Friday. 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 It usually goes away after resting.  Patient is new to the program. He plans to start Monday 06/15/2017.         Comments: ITP 30 Day REVIEW Patient is new to the program. He plans to start Monday 06/15/2017.

## 2017-06-11 NOTE — Progress Notes (Signed)
Daily Session Note  Patient Details  Name: Kyle Patel. MRN: [id number] Date of Birth: 1945-06-19 Referring Provider:     CARDIAC REHAB PHASE II ORIENTATION from 06/11/2017 in [place]  Referring Provider  Dr. Irish Lack      Encounter Date: 06/11/2017  Check In: Session Check In - 06/11/17 0800      Check-In   Location  [facility]    Staff Present  Khole Branch Angelina Pih, MS, EP, [facility], Exercise Physiologist;Gregory Luther Parody, BS, EP, Exercise Physiologist;Debra Wynetta Emery, RN, BSN    Supervising physician immediately available to respond to emergencies  See telemetry face sheet for immediately available MD    Medication changes reported      No    Fall or balance concerns reported     No    Tobacco Cessation  No Change Quit 2005    Warm-up and Cool-down  Performed as group-led instruction    Resistance Training Performed  Yes    VAD Patient?  No      Pain Assessment   Currently in Pain?  No/denies Had pain in (R) knee during walk test. Stopped after rest.     Pain Score  0-No pain    Multiple Pain Sites  No       Capillary Blood Glucose: No results found for this or any previous visit (from the past 24 hour(s)).    Social History   Tobacco Use  Smoking Status Former Smoker  . Packs/day: 0.10  . Years: 20.00  . Pack years: 2.00  . Types: Cigarettes  . Last attempt to quit: 04/25/2004  . Years since quitting: 13.1  Smokeless Tobacco Never Used    Goals Met:  Independence with exercise equipment Exercise tolerated well No report of cardiac concerns or symptoms Strength training completed today  Goals Unmet:  Not Applicable  Comments: Check out: 10:30   Dr. Kate Sable is Medical Director for [facility].

## 2017-06-11 NOTE — Progress Notes (Signed)
Cardiac Individual Treatment Plan  Patient Details  Name: Kyle Patel. MRN: [id number] Date of Birth: Feb 26, 1946 Referring Provider:     CARDIAC REHAB PHASE II ORIENTATION from 06/11/2017 in [place]  Referring Provider  Dr. Irish Lack      Initial Encounter Date:    CARDIAC REHAB PHASE II ORIENTATION from 06/11/2017 in [place]  Date  06/11/17  Referring Provider  Dr. Irish Lack      Visit Diagnosis: NSTEMI (non-ST elevated myocardial infarction) ([place])  S/P PTCA (percutaneous transluminal coronary angioplasty)  Patient's Home Medications on Admission:  Current Outpatient Medications:  .  aspirin 81 MG EC tablet, Take 81 mg by mouth daily., Disp: , Rfl:  .  atorvastatin (LIPITOR) 40 MG tablet, Take 1 tablet (40 mg total) by mouth daily at 6 PM., Disp: 30 tablet, Rfl: 5 .  cholecalciferol (VITAMIN D) 1000 UNITS tablet, Take 1,000 Units by mouth daily., Disp: , Rfl:  .  Coenzyme Q10-Vitamin E (QUNOL ULTRA COQ10) 100-150 MG-UNIT CAPS, Take 1 capsule by mouth at bedtime., Disp: , Rfl:  .  LYRICA 75 MG capsule, Take 75 mg by mouth every evening., Disp: , Rfl:  .  metoprolol succinate (TOPROL-XL) 25 MG 24 hr tablet, Take 1 tablet (25 mg total) by mouth daily., Disp: 90 tablet, Rfl: 1 .  Misc Natural Products (OSTEO BI-FLEX ADV JOINT SHIELD) TABS, Take 1 tablet by mouth daily., Disp: , Rfl:  .  Multiple Vitamins-Minerals (CENTRUM PO), Take 1 tablet by mouth every morning., Disp: , Rfl:  .  nitroGLYCERIN (NITROSTAT) 0.4 MG SL tablet, Place 1 tablet (0.4 mg total) under the tongue every 5 (five) minutes x 3 doses as needed for chest pain., Disp: 25 tablet, Rfl: 2 .  ticagrelor (BRILINTA) 90 MG TABS tablet, Take 1 tablet (90 mg total) by mouth 2 (two) times daily., Disp: 180 tablet, Rfl: 2 .  trolamine salicylate (ASPERCREME) 10 % cream, Apply 1 application topically at bedtime as needed (For arthritis pain in knee)., Disp: , Rfl:   Past  Medical History: Past Medical History:  Diagnosis Date  . Arthritis   . 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 COPD (chronic obstructive pulmonary disease) ([place])   . 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 Hypercholesteremia   . Hypertension   . Pulmonary embolism ([place]) 10/2011    Tobacco Use: Social History   Tobacco Use  Smoking Status Former Smoker  . Packs/day: 0.10  . Years: 20.00  . Pack years: 2.00  . Types: Cigarettes  . Last attempt to quit: 04/25/2004  . Years since quitting: 13.1  Smokeless Tobacco Never Used    Labs: Recent Review Scientist, physiological    Labs for ITP Cardiac and Pulmonary Rehab Latest Ref Rng & Units 04/24/2017 04/25/2017   Cholestrol 0 - 200 mg/dL - 130   LDLCALC 0 - 99 mg/dL - 72   HDL >40 mg/dL - 49   Trlycerides <150 mg/dL - 47   Hemoglobin A1c 4.8 - 5.6 % 6.4(H) -      Capillary Blood Glucose: Lab Results  Component Value Date   GLUCAP 114 (H) 11/18/2011   GLUCAP 124 (H) 11/16/2011   GLUCAP 102 (H) 11/10/2011     Exercise Target Goals: Date: 06/11/17  Exercise Program Goal: Individual exercise prescription set using results from initial 6 min walk test and THRR while considering  patient's activity barriers and safety.   Exercise Prescription Goal: Starting  with aerobic activity 30 plus minutes a day, 3 days per week for initial exercise prescription. Provide home exercise prescription and guidelines that participant acknowledges understanding prior to discharge.  Activity Barriers & Risk Stratification: Activity Barriers & Cardiac Risk Stratification - 06/11/17 0818      Activity Barriers & Cardiac Risk Stratification   Cardiac Risk Stratification  High       6 Minute Walk: 6 Minute Walk    Row Name 06/11/17 0932         6 Minute Walk   Phase  Initial     Distance  1250 feet     Distance % Change  0 %     Distance Feet Change  0 ft     Walk Time  6 minutes     # of Rest Breaks  0      MPH  2.36     METS  2.81     RPE  11     Perceived Dyspnea   11     VO2 Peak  11.51     Symptoms  No     Resting HR  93 bpm     Resting BP  144/84     Resting Oxygen Saturation   95 %     Exercise Oxygen Saturation  during 6 min walk  93 %     Max Ex. HR  121 bpm     Max Ex. BP  152/84     2 Minute Post BP  136/80        Oxygen Initial Assessment: Oxygen Initial Assessment - 06/11/17 1403      Home Oxygen   Home Oxygen Device  None    Sleep Oxygen Prescription  None    Home Exercise Oxygen Prescription  None    Home at Rest Exercise Oxygen Prescription  None      Initial 6 min Walk   Oxygen Used  None       Oxygen Re-Evaluation:   Oxygen Discharge (Final Oxygen Re-Evaluation):   Initial Exercise Prescription: Initial Exercise Prescription - 06/11/17 0800      Date of Initial Exercise RX and Referring Provider   Date  06/11/17    Referring Provider  Dr. Irish Lack      Treadmill   MPH  1.5    Grade  0    Minutes  15    METs  2      NuStep   Level  2    SPM  96    Minutes  20    METs  2.1      Prescription Details   Frequency (times per week)  3    Duration  Progress to 30 minutes of continuous aerobic without signs/symptoms of physical distress      Intensity   THRR 40-80% of Max Heartrate  [id number]    Ratings of Perceived Exertion  11-13    Perceived Dyspnea  0-4      Progression   Progression  Continue progressive overload as per policy without signs/symptoms or physical distress.      Resistance Training   Training Prescription  Yes    Weight  1    Reps  10-15       Perform Capillary Blood Glucose checks as needed.  Exercise Prescription Changes:   Exercise Comments:   Exercise Goals and Review:  Exercise Goals    Row Name 06/11/17 [id number]  Exercise Goals   Increase Physical Activity  Yes       Intervention  Provide advice, education, support and counseling about physical activity/exercise needs.;Develop an  individualized exercise prescription for aerobic and resistive training based on initial evaluation findings, risk stratification, comorbidities and participant's personal goals.       Expected Outcomes  Short Term: Attend rehab on a regular basis to increase amount of physical activity.       Increase Strength and Stamina  Yes       Intervention  Provide advice, education, support and counseling about physical activity/exercise needs.;Develop an individualized exercise prescription for aerobic and resistive training based on initial evaluation findings, risk stratification, comorbidities and participant's personal goals.       Expected Outcomes  Short Term: Increase workloads from initial exercise prescription for resistance, speed, and METs.       Able to understand and use rate of perceived exertion (RPE) scale  Yes       Intervention  Provide education and explanation on how to use RPE scale       Expected Outcomes  Short Term: Able to use RPE daily in rehab to express subjective intensity level;Long Term:  Able to use RPE to guide intensity level when exercising independently       Able to understand and use Dyspnea scale  Yes       Intervention  Provide education and explanation on how to use Dyspnea scale       Expected Outcomes  Short Term: Able to use Dyspnea scale daily in rehab to express subjective sense of shortness of breath during exertion;Long Term: Able to use Dyspnea scale to guide intensity level when exercising independently       Knowledge and understanding of Target Heart Rate Range (THRR)  Yes       Intervention  Provide education and explanation of THRR including how the numbers were predicted and where they are located for reference       Expected Outcomes  Short Term: Able to state/look up THRR       Able to check pulse independently  Yes       Intervention  Provide education and demonstration on how to check pulse in carotid and radial arteries.;Review the importance of  being able to check your own pulse for safety during independent exercise       Expected Outcomes  Long Term: Able to check pulse independently and accurately;Short Term: Able to explain why pulse checking is important during independent exercise       Understanding of Exercise Prescription  Yes       Intervention  Provide education, explanation, and written materials on patient's individual exercise prescription       Expected Outcomes  Short Term: Able to explain program exercise prescription;Long Term: Able to explain home exercise prescription to exercise independently          Exercise Goals Re-Evaluation :    Discharge Exercise Prescription (Final Exercise Prescription Changes):   Nutrition:  Target Goals: Understanding of nutrition guidelines, daily intake of sodium 1500mg , cholesterol 200mg , calories 30% from fat and 7% or less from saturated fats, daily to have 5 or more servings of fruits and vegetables.  Biometrics: Pre Biometrics - 06/11/17 0817      Pre Biometrics   Height  5' 9.5" (1.765 m)    Weight  190 lb 1.6 oz (86.2 kg)    Waist Circumference  40.5 inches    Hip Circumference  40 inches    Waist to Hip Ratio  1.01 %    BMI (Calculated)  27.68    Triceps Skinfold  12 mm    % Body Fat  26.8 %    Grip Strength  58.33 kg    Flexibility  0 in    Single Leg Stand  17 seconds        Nutrition Therapy Plan and Nutrition Goals: Nutrition Therapy & Goals - 06/11/17 1404      Personal Nutrition Goals   Personal Goal #2  Patient has changed his diet. he now eats baked meats, low sodium, low fat choices.     Additional Goals?  No      Intervention Plan   Intervention  Nutrition handout(s) given to patient.    Expected Outcomes  Short Term Goal: Understand basic principles of dietary content, such as calories, fat, sodium, cholesterol and nutrients.;Long Term Goal: Adherence to prescribed nutrition plan.       Nutrition Assessments: Nutrition Assessments -  06/11/17 1406      MEDFICTS Scores   Pre Score  38       Nutrition Goals Re-Evaluation:   Nutrition Goals Discharge (Final Nutrition Goals Re-Evaluation):   Psychosocial: Target Goals: Acknowledge presence or absence of significant depression and/or stress, maximize coping skills, provide positive support system. Participant is able to verbalize types and ability to use techniques and skills needed for reducing stress and depression.  Initial Review & Psychosocial Screening: Initial Psych Review & Screening - 06/11/17 1343      Initial Review   Current issues with  None Identified      Family Dynamics   Good Support System?  Yes      Barriers   Psychosocial barriers to participate in program  There are no identifiable barriers or psychosocial needs.      Screening Interventions   Interventions  Encouraged to exercise    Expected Outcomes  Short Term goal: Identification and review with participant of any Quality of Life or Depression concerns found by scoring the questionnaire.;Long Term goal: The participant improves quality of Life and PHQ9 Scores as seen by post scores and/or verbalization of changes       Quality of Life Scores: Quality of Life - 06/11/17 0816      Quality of Life Scores   Health/Function Pre  24.54 %    Socioeconomic Pre  29.38 %    Psych/Spiritual Pre  30 %    Family Pre  27.6 %    GLOBAL Pre  27.33 %      Scores of 19 and below usually indicate a poorer quality of life in these areas.  A difference of  2-3 points is a clinically meaningful difference.  A difference of 2-3 points in the total score of the Quality of Life Index has been associated with significant improvement in overall quality of life, self-image, physical symptoms, and general health in studies assessing change in quality of life.  PHQ-9: Recent Review Flowsheet Data    Depression screen [facility] 2/9 06/11/2017   Decreased Interest 0   Down, Depressed, Hopeless 0   PHQ - 2 Score 0    Altered sleeping 2   Tired, decreased energy 0   Change in appetite 0   Feeling bad or failure about yourself  0   Trouble concentrating 0   Moving slowly or fidgety/restless 0   Suicidal thoughts 0   PHQ-9 Score 2   Difficult doing work/chores Somewhat  difficult     Interpretation of Total Score  Total Score Depression Severity:  1-4 = Minimal depression, 5-9 = Mild depression, 10-14 = Moderate depression, 15-19 = Moderately severe depression, 20-27 = Severe depression   Psychosocial Evaluation and Intervention: Psychosocial Evaluation - 06/11/17 1410      Psychosocial Evaluation & Interventions   Interventions  Encouraged to exercise with the program and follow exercise prescription    Continue Psychosocial Services   No Follow up required       Psychosocial Re-Evaluation:   Psychosocial Discharge (Final Psychosocial Re-Evaluation):   Vocational Rehabilitation: Provide vocational rehab assistance to qualifying candidates.   Vocational Rehab Evaluation & Intervention: Vocational Rehab - 06/11/17 1359      Initial Vocational Rehab Evaluation & Intervention   Assessment shows need for Vocational Rehabilitation  No Still works part-time       Education: Education Goals: Education classes will be provided on a weekly basis, covering required topics. Participant will state understanding/return demonstration of topics presented.  Learning Barriers/Preferences: Learning Barriers/Preferences - 06/11/17 1358      Learning Barriers/Preferences   Learning Barriers  None    Learning Preferences  Group Instruction;Individual Instruction;Pictoral;Skilled Demonstration;Verbal Instruction;Video;Written Material;Computer/Internet;Audio       Education Topics: Hypertension, Hypertension Reduction -Define heart disease and high blood pressure. Discus how high blood pressure affects the body and ways to reduce high blood pressure.   Exercise and Your Heart -Discuss why it  is important to exercise, the FITT principles of exercise, normal and abnormal responses to exercise, and how to exercise safely.   Angina -Discuss definition of angina, causes of angina, treatment of angina, and how to decrease risk of having angina.   Cardiac Medications -Review what the following cardiac medications are used for, how they affect the body, and side effects that may occur when taking the medications.  Medications include Aspirin, Beta blockers, calcium channel blockers, ACE Inhibitors, angiotensin receptor blockers, diuretics, digoxin, and antihyperlipidemics.   Congestive Heart Failure -Discuss the definition of CHF, how to live with CHF, the signs and symptoms of CHF, and how keep track of weight and sodium intake.   Heart Disease and Intimacy -Discus the effect sexual activity has on the heart, how changes occur during intimacy as we age, and safety during sexual activity.   Smoking Cessation / COPD -Discuss different methods to quit smoking, the health benefits of quitting smoking, and the definition of COPD.   Nutrition I: Fats -Discuss the types of cholesterol, what cholesterol does to the heart, and how cholesterol levels can be controlled.   Nutrition II: Labels -Discuss the different components of food labels and how to read food label   Heart Parts and Heart Disease -Discuss the anatomy of the heart, the pathway of blood circulation through the heart, and these are affected by heart disease.   Stress I: Signs and Symptoms -Discuss the causes of stress, how stress may lead to anxiety and depression, and ways to limit stress.   Stress II: Relaxation -Discuss different types of relaxation techniques to limit stress.   Warning Signs of Stroke / TIA -Discuss definition of a stroke, what the signs and symptoms are of a stroke, and how to identify when someone is having stroke.   Knowledge Questionnaire Score: Knowledge Questionnaire Score -  06/11/17 1359      Knowledge Questionnaire Score   Pre Score  23/24       Core Components/Risk Factors/Patient Goals at Admission: Personal Goals and Risk Factors at  Admission - 06/11/17 1406      Core Components/Risk Factors/Patient Goals on Admission    Weight Management  Weight Maintenance    Personal Goal Other  Yes    Personal Goal  Get better, Live longer and healthier    Intervention  Attend CR 3xweek and supplement with home exercise 2 x week.     Expected Outcomes  Achieve personal goals       Core Components/Risk Factors/Patient Goals Review:    Core Components/Risk Factors/Patient Goals at Discharge (Final Review):    ITP Comments: ITP Comments    Row Name 06/11/17 1400           ITP Comments  Mr. Creasy is tarting our CR program on Monday 06/15/17 at 8:15 on Monday and Wednesday and at 3:45 on Friday. 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 It usually goes away after resting.          Comments: Patient arrived for 1st visit/orientation/education at 0800. Patient was referred to CR by Dr. Irish Lack due to NSTEMI (I21.4) and PTCA (Z98.61). Dr. Bronson Ing is his Cardiologist. During orientation advised patient on arrival and appointment times what to wear, what to do before, during and after exercise. Reviewed attendance and class policy. Talked about inclement weather and class consultation policy. Pt is scheduled to return Cardiac Rehab on 06/15/17 at [facility]. Pt was advised to come to class 15 minutes before class starts. Patient was also given instructions on meeting with the dietician and attending the Family Structure classes. Discussed RPE/Dpysnea scales. Discussed initial THR and how to find their radial and/or carotid pulse. Discussed the initial exercise prescription and how this effects their progress. Pt is eager to get started. Patient participated in warm up stretches followed by light weights and resistance bands.  Patient was able to complete 6 minute walk test. Patient c/o (R) knee 3/10 during pain. Pain subsided to 0/10 at 2 minute rest. Pain was completely gone at the end of orientation. Patient was measured for the equipment. Discussed equipment safety with patient. Took patient pre-anthropometric measurements. Patient finished visit at 10:30.

## 2017-06-11 NOTE — Progress Notes (Signed)
Cardiac/Pulmonary Rehab Medication Review by a Pharmacist  Does the patient  feel that his/her medications are working for him/her?  Not 100% sure  Has the patient been experiencing any side effects to the medications prescribed?  Yes, atorvastatin causing joint pain.  Dose recently reduced.  Pain is still present.  Instructed patient to continue to talk with his provider about this side effect, especially if symptoms worsen.  Does the patient measure his/her own blood pressure or blood glucose at home?  no   Does the patient have any problems obtaining medications due to transportation or finances?   no  Understanding of regimen: fair Understanding of indications: good Potential of compliance: excellent, states never misses medications.  Pharmacist comments: No further questions.  Discussed SL nitroglycerin with patient.  Understands how to use and to call provider is usage is necessary.  Pills are stable in bottle until expiration date as long as lid remains screwed on tightly.  Kyle Patel 06/11/2017 9:42 AM

## 2017-06-15 ENCOUNTER — Encounter (HOSPITAL_COMMUNITY)
Admission: RE | Admit: 2017-06-15 | Discharge: 2017-06-15 | Disposition: A | Discharge status: Home / Self Care | Payer: Medicare Other | Source: Ambulatory Visit | Attending: Cardiovascular Disease | Admitting: Cardiovascular Disease

## 2017-06-15 DIAGNOSIS — I214 Non-ST elevation (NSTEMI) myocardial infarction: Secondary | ICD-10-CM

## 2017-06-15 DIAGNOSIS — Z9861 Coronary angioplasty status: Secondary | ICD-10-CM

## 2017-06-15 NOTE — Progress Notes (Signed)
Daily Session Note  Patient Details  Name: Kyle Patel. MRN: [id number] Date of Birth: 10/20/1945 Referring Provider:     CARDIAC REHAB PHASE II ORIENTATION from 06/11/2017 in [place]  Referring Provider  Dr. Irish Lack      Encounter Date: 06/15/2017  Check In: Session Check In - 06/15/17 0814      Check-In   Location  [facility]    Staff Present  Russella Dar, MS, EP, [facility], Exercise Physiologist;Debra Wynetta Emery, RN, BSN;Lenard Kampf, BS, EP, Exercise Physiologist    Supervising physician immediately available to respond to emergencies  See telemetry face sheet for immediately available MD    Medication changes reported      No    Fall or balance concerns reported     No    Warm-up and Cool-down  Performed as group-led instruction    Resistance Training Performed  Yes    VAD Patient?  No      Pain Assessment   Currently in Pain?  No/denies    Pain Score  0-No pain    Multiple Pain Sites  No       Capillary Blood Glucose: No results found for this or any previous visit (from the past 24 hour(s)).    Social History   Tobacco Use  Smoking Status Former Smoker  . Packs/day: 0.10  . Years: 20.00  . Pack years: 2.00  . Types: Cigarettes  . Last attempt to quit: 04/25/2004  . Years since quitting: 13.1  Smokeless Tobacco Never Used    Goals Met:  Independence with exercise equipment Exercise tolerated well No report of cardiac concerns or symptoms Strength training completed today  Goals Unmet:  Not Applicable  Comments: Check out 915   Dr. Kate Sable is Medical Director for [facility] and Pulmonary Rehab.

## 2017-06-17 ENCOUNTER — Encounter (HOSPITAL_COMMUNITY)
Admission: RE | Admit: 2017-06-17 | Discharge: 2017-06-17 | Disposition: A | Discharge status: Home / Self Care | Payer: Medicare Other | Source: Ambulatory Visit | Attending: Cardiovascular Disease | Admitting: Cardiovascular Disease

## 2017-06-17 DIAGNOSIS — I214 Non-ST elevation (NSTEMI) myocardial infarction: Secondary | ICD-10-CM | POA: Diagnosis not present

## 2017-06-17 DIAGNOSIS — Z9861 Coronary angioplasty status: Secondary | ICD-10-CM

## 2017-06-17 NOTE — Progress Notes (Signed)
Patient recieved the take hom eexercise plan today. THR was addressed as were safety guidelines for exercising when not in CR. Patient demonstrated an understanding and was encouraged to ask any future questions as they arise.

## 2017-06-17 NOTE — Progress Notes (Signed)
Daily Session Note  Patient Details  Name: Kyle Patel. MRN: [id number] Date of Birth: 1946-04-11 Referring Provider:     CARDIAC REHAB PHASE II ORIENTATION from 06/11/2017 in [place]  Referring Provider  Dr. Irish Lack      Encounter Date: 06/17/2017  Check In: Session Check In - 06/17/17 0812      Check-In   Location  [facility]    Staff Present  Diane Angelina Pih, MS, EP, [facility], Exercise Physiologist;[employer] [place] Luther Parody, BS, EP, Exercise Physiologist;Debra Wynetta Emery, RN, BSN    Supervising physician immediately available to respond to emergencies  See telemetry face sheet for immediately available MD    Medication changes reported      No    Fall or balance concerns reported     No    Warm-up and Cool-down  Performed as group-led instruction    Resistance Training Performed  Yes    VAD Patient?  No      Pain Assessment   Currently in Pain?  No/denies    Pain Score  0-No pain    Multiple Pain Sites  No       Capillary Blood Glucose: No results found for this or any previous visit (from the past 24 hour(s)).    Social History   Tobacco Use  Smoking Status Former Smoker  . Packs/day: 0.10  . Years: 20.00  . Pack years: 2.00  . Types: Cigarettes  . Last attempt to quit: 04/25/2004  . Years since quitting: 13.1  Smokeless Tobacco Never Used    Goals Met:  Independence with exercise equipment Exercise tolerated well No report of cardiac concerns or symptoms Strength training completed today  Goals Unmet:  Not Applicable  Comments: Check out 915   Dr. Kate Sable is Medical Director for [facility] [facility] and Pulmonary Rehab.

## 2017-06-19 ENCOUNTER — Encounter (HOSPITAL_COMMUNITY)
Admission: RE | Admit: 2017-06-19 | Discharge: 2017-06-19 | Disposition: A | Discharge status: Home / Self Care | Payer: Medicare Other | Source: Ambulatory Visit | Attending: Cardiovascular Disease | Admitting: Cardiovascular Disease

## 2017-06-19 DIAGNOSIS — Z9861 Coronary angioplasty status: Secondary | ICD-10-CM

## 2017-06-19 DIAGNOSIS — I214 Non-ST elevation (NSTEMI) myocardial infarction: Secondary | ICD-10-CM | POA: Diagnosis not present

## 2017-06-19 NOTE — Progress Notes (Signed)
Daily Session Note  Patient Details  Name: Kyle Patel. MRN: [id number] Date of Birth: 1945-10-28 Referring Provider:     CARDIAC REHAB PHASE II ORIENTATION from 06/11/2017 in [place]  Referring Provider  Dr. Irish Lack      Encounter Date: 06/19/2017  Check In: Session Check In - 06/19/17 0838      Check-In   Location  [facility]    Staff Present  Russella Dar, MS, EP, [facility], Exercise Physiologist;Debra Wynetta Emery, RN, BSN;Candiace West, BS, EP, Exercise Physiologist    Supervising physician immediately available to respond to emergencies  See telemetry face sheet for immediately available MD    Medication changes reported      No    Fall or balance concerns reported     No    Warm-up and Cool-down  Performed as group-led instruction    Resistance Training Performed  Yes    VAD Patient?  No      Pain Assessment   Currently in Pain?  No/denies    Pain Score  0-No pain    Multiple Pain Sites  No       Capillary Blood Glucose: No results found for this or any previous visit (from the past 24 hour(s)).    Social History   Tobacco Use  Smoking Status Former Smoker  . Packs/day: 0.10  . Years: 20.00  . Pack years: 2.00  . Types: Cigarettes  . Last attempt to quit: 04/25/2004  . Years since quitting: 13.1  Smokeless Tobacco Never Used    Goals Met:  Independence with exercise equipment Exercise tolerated well No report of cardiac concerns or symptoms Strength training completed today  Goals Unmet:  Not Applicable  Comments: Check out 915   Dr. Kate Sable is Medical Director for [facility] and Pulmonary Rehab.

## 2017-06-22 ENCOUNTER — Encounter (HOSPITAL_COMMUNITY)
Admission: RE | Admit: 2017-06-22 | Discharge: 2017-06-22 | Disposition: A | Discharge status: Home / Self Care | Payer: Medicare Other | Source: Ambulatory Visit | Attending: Cardiovascular Disease | Admitting: Cardiovascular Disease

## 2017-06-22 DIAGNOSIS — Z9861 Coronary angioplasty status: Secondary | ICD-10-CM

## 2017-06-22 DIAGNOSIS — I214 Non-ST elevation (NSTEMI) myocardial infarction: Secondary | ICD-10-CM | POA: Diagnosis not present

## 2017-06-22 NOTE — Progress Notes (Signed)
Daily Session Note  Patient Details  Name: Kyle Patel. MRN: [id number] Date of Birth: 05-03-1946 Referring Provider:     CARDIAC REHAB PHASE II ORIENTATION from 06/11/2017 in [place]  Referring Provider  Dr. Irish Lack      Encounter Date: 06/22/2017  Check In: Session Check In - 06/22/17 0821      Check-In   Location  [facility]    Staff Present  Aundra Dubin, RN, BSN;Diane Coad, MS, EP, [facility], Exercise Physiologist;Vasco Chong Luther Parody, BS, EP, Exercise Physiologist    Supervising physician immediately available to respond to emergencies  See telemetry face sheet for immediately available MD    Medication changes reported      No    Fall or balance concerns reported     No    Warm-up and Cool-down  Performed as group-led instruction    Resistance Training Performed  Yes    VAD Patient?  No      Pain Assessment   Pain Score  0-No pain    Multiple Pain Sites  No       Capillary Blood Glucose: No results found for this or any previous visit (from the past 24 hour(s)).    Social History   Tobacco Use  Smoking Status Former Smoker  . Packs/day: 0.10  . Years: 20.00  . Pack years: 2.00  . Types: Cigarettes  . Last attempt to quit: 04/25/2004  . Years since quitting: 13.1  Smokeless Tobacco Never Used    Goals Met:  Independence with exercise equipment Exercise tolerated well No report of cardiac concerns or symptoms Strength training completed today  Goals Unmet:  Not Applicable  Comments: Check out 915   Dr. Kate Sable is Medical Director for [place] and Pulmonary Rehab.

## 2017-06-24 ENCOUNTER — Encounter (HOSPITAL_COMMUNITY)
Admission: RE | Admit: 2017-06-24 | Discharge: 2017-06-24 | Disposition: A | Discharge status: Home / Self Care | Payer: Medicare Other | Source: Ambulatory Visit | Attending: Cardiovascular Disease | Admitting: Cardiovascular Disease

## 2017-06-24 DIAGNOSIS — I214 Non-ST elevation (NSTEMI) myocardial infarction: Secondary | ICD-10-CM

## 2017-06-24 DIAGNOSIS — Z9861 Coronary angioplasty status: Secondary | ICD-10-CM

## 2017-06-24 NOTE — Progress Notes (Signed)
Daily Session Note  Patient Details  Name: Kyle Patel. MRN: [id number] Date of Birth: 10-25-1945 Referring Provider:     CARDIAC REHAB PHASE II ORIENTATION from 06/11/2017 in [place]  Referring Provider  Dr. Irish Lack      Encounter Date: 06/24/2017  Check In: Session Check In - 06/24/17 0809      Check-In   Location  [facility]    Staff Present  Russella Dar, MS, EP, [facility], Exercise Physiologist;Debra Wynetta Emery, RN, BSN;Tiearra Colwell, BS, EP, Exercise Physiologist    Supervising physician immediately available to respond to emergencies  See telemetry face sheet for immediately available MD    Medication changes reported      No    Fall or balance concerns reported     No    Warm-up and Cool-down  Performed as group-led instruction    Resistance Training Performed  Yes    VAD Patient?  No      Pain Assessment   Currently in Pain?  No/denies    Pain Score  0-No pain    Multiple Pain Sites  No       Capillary Blood Glucose: No results found for this or any previous visit (from the past 24 hour(s)).    Social History   Tobacco Use  Smoking Status Former Smoker  . Packs/day: 0.10  . Years: 20.00  . Pack years: 2.00  . Types: Cigarettes  . Last attempt to quit: 04/25/2004  . Years since quitting: 13.1  Smokeless Tobacco Never Used    Goals Met:  Independence with exercise equipment Exercise tolerated well No report of cardiac concerns or symptoms Strength training completed today  Goals Unmet:  Not Applicable  Comments: Check out 915   Dr. Kate Sable is Medical Director for [place] and Pulmonary Rehab.

## 2017-06-26 ENCOUNTER — Encounter (HOSPITAL_COMMUNITY)
Admission: RE | Admit: 2017-06-26 | Discharge: 2017-06-26 | Disposition: A | Discharge status: Home / Self Care | Payer: Medicare Other | Source: Ambulatory Visit | Attending: Cardiovascular Disease | Admitting: Cardiovascular Disease

## 2017-06-26 DIAGNOSIS — Z9861 Coronary angioplasty status: Secondary | ICD-10-CM

## 2017-06-26 DIAGNOSIS — I214 Non-ST elevation (NSTEMI) myocardial infarction: Secondary | ICD-10-CM | POA: Diagnosis present

## 2017-06-26 DIAGNOSIS — Z955 Presence of coronary angioplasty implant and graft: Secondary | ICD-10-CM | POA: Diagnosis present

## 2017-06-26 NOTE — Progress Notes (Signed)
Daily Session Note  Patient Details  Name: Kyle H Goebel Jr. MRN: [id number] Date of Birth: 12/09/1945 Referring Provider:     CARDIAC REHAB PHASE II ORIENTATION from 06/11/2017 in [place] [facility]  Referring Provider  Dr. Varanasi      Encounter Date: 06/26/2017  Check In: Session Check In - 06/26/17 0810      Check-In   Location  [facility]    Staff Present  Diane Coad, MS, EP, [facility], Exercise Physiologist;Debra Johnson, RN, BSN;Tameia Rafferty, BS, EP, Exercise Physiologist    Supervising physician immediately available to respond to emergencies  See telemetry face sheet for immediately available MD    Medication changes reported      No    Fall or balance concerns reported     No    Warm-up and Cool-down  Performed as group-led instruction    Resistance Training Performed  Yes    VAD Patient?  No      Pain Assessment   Currently in Pain?  No/denies    Pain Score  0-No pain    Multiple Pain Sites  No       Capillary Blood Glucose: No results found for this or any previous visit (from the past 24 hour(s)).    Social History   Tobacco Use  Smoking Status Former Smoker  . Packs/day: 0.10  . Years: 20.00  . Pack years: 2.00  . Types: Cigarettes  . Last attempt to quit: 04/25/2004  . Years since quitting: 13.1  Smokeless Tobacco Never Used    Goals Met:  Independence with exercise equipment Exercise tolerated well No report of cardiac concerns or symptoms Strength training completed today  Goals Unmet:  Not Applicable  Comments: Check out 915   Dr. Suresh Koneswaran is Medical Director for [place] [facility]. 

## 2017-06-29 ENCOUNTER — Encounter (HOSPITAL_COMMUNITY)
Admission: RE | Admit: 2017-06-29 | Discharge: 2017-06-29 | Disposition: A | Discharge status: Home / Self Care | Payer: Medicare Other | Source: Ambulatory Visit | Attending: Cardiovascular Disease | Admitting: Cardiovascular Disease

## 2017-06-29 DIAGNOSIS — I214 Non-ST elevation (NSTEMI) myocardial infarction: Secondary | ICD-10-CM

## 2017-06-29 DIAGNOSIS — Z9861 Coronary angioplasty status: Secondary | ICD-10-CM

## 2017-06-29 NOTE — Progress Notes (Signed)
Daily Session Note  Patient Details  Name: Kyle Patel. MRN: [id number] Date of Birth: [birth year] Referring Provider:     CARDIAC REHAB PHASE II ORIENTATION from 06/11/2017 in [place]  Referring Provider  Dr. Irish Lack      Encounter Date: 06/29/2017  Check In: Session Check In - 06/29/17 0816      Check-In   Location  [facility]    Staff Present  Russella Dar, MS, EP, [facility], Exercise Physiologist;Debra Wynetta Emery, RN, BSN;Gelila Well, BS, EP, Exercise Physiologist    Supervising physician immediately available to respond to emergencies  See telemetry face sheet for immediately available MD    Medication changes reported      No    Fall or balance concerns reported     No    Warm-up and Cool-down  Performed as group-led instruction    Resistance Training Performed  Yes    VAD Patient?  No      Pain Assessment   Currently in Pain?  No/denies    Pain Score  0-No pain    Multiple Pain Sites  No       Capillary Blood Glucose: No results found for this or any previous visit (from the past 24 hour(s)).    Social History   Tobacco Use  Smoking Status Former Smoker  . Packs/day: 0.10  . Years: 20.00  . Pack years: 2.00  . Types: Cigarettes  . Last attempt to quit: 04/25/2004  . Years since quitting: 13.1  Smokeless Tobacco Never Used    Goals Met:  Independence with exercise equipment Exercise tolerated well No report of cardiac concerns or symptoms Strength training completed today  Goals Unmet:  Not Applicable  Comments: Check out 915   Dr. Kate Sable is Medical Director for [place] and Pulmonary Rehab.

## 2017-07-01 ENCOUNTER — Encounter (HOSPITAL_COMMUNITY)
Admission: RE | Admit: 2017-07-01 | Discharge: 2017-07-01 | Disposition: A | Discharge status: Home / Self Care | Payer: Medicare Other | Source: Ambulatory Visit | Attending: Cardiovascular Disease | Admitting: Cardiovascular Disease

## 2017-07-01 DIAGNOSIS — I214 Non-ST elevation (NSTEMI) myocardial infarction: Secondary | ICD-10-CM | POA: Diagnosis not present

## 2017-07-01 DIAGNOSIS — Z9861 Coronary angioplasty status: Secondary | ICD-10-CM

## 2017-07-01 NOTE — Progress Notes (Signed)
Daily Session Note  Patient Details  Name: Kyle Patel. MRN: [id number] Date of Birth: 1946/05/23 Referring Provider:     CARDIAC REHAB PHASE II ORIENTATION from 06/11/2017 in [place]  Referring Provider  Dr. Irish Lack      Encounter Date: 07/01/2017  Check In: Session Check In - 07/01/17 0824      Check-In   Location  [facility]    Staff Present  Russella Dar, MS, EP, [facility], Exercise Physiologist;Debra Wynetta Emery, RN, BSN;Eduar Kumpf, BS, EP, Exercise Physiologist    Supervising physician immediately available to respond to emergencies  See telemetry face sheet for immediately available MD    Medication changes reported      No    Fall or balance concerns reported     No    Warm-up and Cool-down  Performed as group-led instruction    Resistance Training Performed  Yes    VAD Patient?  No      Pain Assessment   Currently in Pain?  No/denies    Pain Score  0-No pain    Multiple Pain Sites  No       Capillary Blood Glucose: No results found for this or any previous visit (from the past 24 hour(s)).    Social History   Tobacco Use  Smoking Status Former Smoker  . Packs/day: 0.10  . Years: 20.00  . Pack years: 2.00  . Types: Cigarettes  . Last attempt to quit: 04/25/2004  . Years since quitting: 13.1  Smokeless Tobacco Never Used    Goals Met:  Independence with exercise equipment Exercise tolerated well No report of cardiac concerns or symptoms Strength training completed today  Goals Unmet:  Not Applicable  Comments: Check out 915   Dr. Kate Sable is Medical Director for [place] and Pulmonary Rehab.

## 2017-07-03 ENCOUNTER — Encounter (HOSPITAL_COMMUNITY): Payer: Medicare Other

## 2017-07-06 ENCOUNTER — Encounter (HOSPITAL_COMMUNITY)
Admission: RE | Admit: 2017-07-06 | Discharge: 2017-07-06 | Disposition: A | Discharge status: Home / Self Care | Payer: Medicare Other | Source: Ambulatory Visit | Attending: Cardiovascular Disease | Admitting: Cardiovascular Disease

## 2017-07-06 DIAGNOSIS — I214 Non-ST elevation (NSTEMI) myocardial infarction: Secondary | ICD-10-CM

## 2017-07-06 DIAGNOSIS — Z9861 Coronary angioplasty status: Secondary | ICD-10-CM

## 2017-07-06 NOTE — Progress Notes (Signed)
Daily Session Note  Patient Details  Name: Kyle Patel. MRN: [id number] Date of Birth: [birth year] Referring Provider:     CARDIAC REHAB PHASE II ORIENTATION from 06/11/2017 in [place]  Referring Provider  Dr. Irish Lack      Encounter Date: 07/06/2017  Check In: Session Check In - 07/06/17 0815      Check-In   Location  [facility]    Staff Present  Diane Angelina Pih, MS, EP, [facility], Exercise Physiologist;Genola Yuille Wynetta Emery, RN, BSN;Gregory Cowan, BS, EP, Exercise Physiologist    Supervising physician immediately available to respond to emergencies  See telemetry face sheet for immediately available MD    Medication changes reported      No    Fall or balance concerns reported     No    Warm-up and Cool-down  Performed as group-led instruction    Resistance Training Performed  Yes    VAD Patient?  No      Pain Assessment   Currently in Pain?  No/denies    Pain Score  0-No pain    Multiple Pain Sites  No       Capillary Blood Glucose: No results found for this or any previous visit (from the past 24 hour(s)).    Social History   Tobacco Use  Smoking Status Former Smoker  . Packs/day: 0.10  . Years: 20.00  . Pack years: 2.00  . Types: Cigarettes  . Last attempt to quit: 04/25/2004  . Years since quitting: 13.2  Smokeless Tobacco Never Used    Goals Met:  Independence with exercise equipment Exercise tolerated well No report of cardiac concerns or symptoms Strength training completed today  Goals Unmet:  Not Applicable  Comments: Check out 915.   Dr. Kate Sable is Medical Director for [facility] and Pulmonary Rehab.

## 2017-07-08 ENCOUNTER — Encounter (HOSPITAL_COMMUNITY): Admission: RE | Admit: 2017-07-08 | Payer: Medicare Other | Source: Ambulatory Visit

## 2017-07-08 ENCOUNTER — Encounter: Payer: Self-pay | Admitting: Cardiovascular Disease

## 2017-07-08 ENCOUNTER — Ambulatory Visit: Payer: Medicare Other | Admitting: Cardiovascular Disease

## 2017-07-08 VITALS — BP 144/82 | HR 74 | Ht 69.5 in | Wt 188.6 lb

## 2017-07-08 DIAGNOSIS — I25118 Atherosclerotic heart disease of native coronary artery with other forms of angina pectoris: Secondary | ICD-10-CM | POA: Diagnosis not present

## 2017-07-08 DIAGNOSIS — N183 Chronic kidney disease, stage 3 unspecified: Secondary | ICD-10-CM

## 2017-07-08 DIAGNOSIS — Z79899 Other long term (current) drug therapy: Secondary | ICD-10-CM

## 2017-07-08 DIAGNOSIS — E785 Hyperlipidemia, unspecified: Secondary | ICD-10-CM | POA: Diagnosis not present

## 2017-07-08 DIAGNOSIS — I10 Essential (primary) hypertension: Secondary | ICD-10-CM

## 2017-07-08 MED ORDER — ROSUVASTATIN CALCIUM 20 MG PO TABS: 20.0000 mg | ORAL_TABLET | Freq: Every day | ORAL | 3 refills | Status: DC

## 2017-07-08 MED ORDER — LISINOPRIL 5 MG PO TABS: 5.0000 mg | ORAL_TABLET | Freq: Every day | ORAL | 3 refills | Status: DC

## 2017-07-08 NOTE — Progress Notes (Signed)
Cardiac Individual Treatment Plan  Patient Details  Name: Kyle Patel. MRN: [id number] Date of Birth: Mar 12, 1946 Referring Provider:     CARDIAC REHAB PHASE II ORIENTATION from 06/11/2017 in [place]  Referring Provider  Dr. Irish Lack      Initial Encounter Date:    CARDIAC REHAB PHASE II ORIENTATION from 06/11/2017 in [place]  Date  06/11/17  Referring Provider  Dr. Irish Lack      Visit Diagnosis: NSTEMI (non-ST elevated myocardial infarction) ([place])  S/P PTCA (percutaneous transluminal coronary angioplasty)  Patient's Home Medications on Admission:  Current Outpatient Medications:  .  aspirin 81 MG EC tablet, Take 81 mg by mouth daily., Disp: , Rfl:  .  cholecalciferol (VITAMIN D) 1000 UNITS tablet, Take 1,000 Units by mouth daily., Disp: , Rfl:  .  Coenzyme Q10-Vitamin E (QUNOL ULTRA COQ10) 100-150 MG-UNIT CAPS, Take 1 capsule by mouth at bedtime., Disp: , Rfl:  .  lisinopril (PRINIVIL,ZESTRIL) 5 MG tablet, Take 1 tablet (5 mg total) by mouth daily., Disp: 90 tablet, Rfl: 3 .  LYRICA 75 MG capsule, Take 75 mg by mouth every evening., Disp: , Rfl:  .  metoprolol succinate (TOPROL-XL) 25 MG 24 hr tablet, Take 1 tablet (25 mg total) by mouth daily., Disp: 90 tablet, Rfl: 1 .  Misc Natural Products (OSTEO BI-FLEX ADV JOINT SHIELD) TABS, Take 1 tablet by mouth daily., Disp: , Rfl:  .  Multiple Vitamins-Minerals (CENTRUM PO), Take 1 tablet by mouth every morning., Disp: , Rfl:  .  nitroGLYCERIN (NITROSTAT) 0.4 MG SL tablet, Place 1 tablet (0.4 mg total) under the tongue every 5 (five) minutes x 3 doses as needed for chest pain., Disp: 25 tablet, Rfl: 2 .  rosuvastatin (CRESTOR) 20 MG tablet, Take 1 tablet (20 mg total) by mouth daily., Disp: 90 tablet, Rfl: 3 .  ticagrelor (BRILINTA) 90 MG TABS tablet, Take 1 tablet (90 mg total) by mouth 2 (two) times daily., Disp: 180 tablet, Rfl: 2 .  trolamine salicylate (ASPERCREME) 10 % cream,  Apply 1 application topically at bedtime as needed (For arthritis pain in knee)., Disp: , Rfl:   Past Medical History: Past Medical History:  Diagnosis Date  . Arthritis   . 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 COPD (chronic obstructive pulmonary disease) ([place])   . 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 Hypercholesteremia   . Hypertension   . Pulmonary embolism ([place]) 10/2011    Tobacco Use: Social History   Tobacco Use  Smoking Status Former Smoker  . Packs/day: 0.10  . Years: 20.00  . Pack years: 2.00  . Types: Cigarettes  . Last attempt to quit: 04/25/2004  . Years since quitting: 13.2  Smokeless Tobacco Never Used    Labs: Recent Review Scientist, physiological    Labs for ITP Cardiac and Pulmonary Rehab Latest Ref Rng & Units 04/24/2017 04/25/2017   Cholestrol 0 - 200 mg/dL - 130   LDLCALC 0 - 99 mg/dL - 72   HDL >40 mg/dL - 49   Trlycerides <150 mg/dL - 47   Hemoglobin A1c 4.8 - 5.6 % 6.4(H) -      Capillary Blood Glucose: Lab Results  Component Value Date   GLUCAP 114 (H) 11/18/2011   GLUCAP 124 (H) 11/16/2011   GLUCAP 102 (H) 11/10/2011     Exercise Target Goals:    Exercise Program Goal: Individual exercise prescription set using results from initial 6 min  walk test and THRR while considering  patient's activity barriers and safety.   Exercise Prescription Goal: Starting with aerobic activity 30 plus minutes a day, 3 days per week for initial exercise prescription. Provide home exercise prescription and guidelines that participant acknowledges understanding prior to discharge.  Activity Barriers & Risk Stratification: Activity Barriers & Cardiac Risk Stratification - 06/11/17 0818      Activity Barriers & Cardiac Risk Stratification   Cardiac Risk Stratification  High       6 Minute Walk: 6 Minute Walk    Row Name 06/11/17 0932         6 Minute Walk   Phase  Initial     Distance  1250 feet     Distance % Change  0  %     Distance Feet Change  0 ft     Walk Time  6 minutes     # of Rest Breaks  0     MPH  2.36     METS  2.81     RPE  11     Perceived Dyspnea   11     VO2 Peak  11.51     Symptoms  No     Resting HR  93 bpm     Resting BP  144/84     Resting Oxygen Saturation   95 %     Exercise Oxygen Saturation  during 6 min walk  93 %     Max Ex. HR  121 bpm     Max Ex. BP  152/84     2 Minute Post BP  136/80        Oxygen Initial Assessment: Oxygen Initial Assessment - 06/11/17 1403      Home Oxygen   Home Oxygen Device  None    Sleep Oxygen Prescription  None    Home Exercise Oxygen Prescription  None    Home at Rest Exercise Oxygen Prescription  None      Initial 6 min Walk   Oxygen Used  None       Oxygen Re-Evaluation:   Oxygen Discharge (Final Oxygen Re-Evaluation):   Initial Exercise Prescription: Initial Exercise Prescription - 06/11/17 0800      Date of Initial Exercise RX and Referring Provider   Date  06/11/17    Referring Provider  Dr. Irish Lack      Treadmill   MPH  1.5    Grade  0    Minutes  15    METs  2      NuStep   Level  2    SPM  96    Minutes  20    METs  2.1      Prescription Details   Frequency (times per week)  3    Duration  Progress to 30 minutes of continuous aerobic without signs/symptoms of physical distress      Intensity   THRR 40-80% of Max Heartrate  [phone]    Ratings of Perceived Exertion  11-13    Perceived Dyspnea  0-4      Progression   Progression  Continue progressive overload as per policy without signs/symptoms or physical distress.      Resistance Training   Training Prescription  Yes    Weight  1    Reps  10-15       Perform Capillary Blood Glucose checks as needed.  Exercise Prescription Changes:  Exercise Prescription Changes    Row Name 06/17/17 0800  06/23/17 0700 07/08/17 1400         Response to Exercise   Blood Pressure (Admit)  -  150/82  152/80     Blood Pressure (Exercise)  -  160/82   162/82     Blood Pressure (Exit)  -  142/80  136/86     Heart Rate (Admit)  -  68 bpm  67 bpm     Heart Rate (Exercise)  -  104 bpm  113 bpm     Heart Rate (Exit)  -  76 bpm  76 bpm     Rating of Perceived Exertion (Exercise)  -  11  10     Duration  -  Progress to 30 minutes of  aerobic without signs/symptoms of physical distress  Progress to 30 minutes of  aerobic without signs/symptoms of physical distress     Intensity  -  THRR New [id number]  THRR New 101-117-134       Progression   Progression  -  Continue to progress workloads to maintain intensity without signs/symptoms of physical distress.  Continue to progress workloads to maintain intensity without signs/symptoms of physical distress.       Resistance Training   Training Prescription  Yes  Yes  Yes     Weight  1  1  2      Reps  10-15  10-15  10-15       Treadmill   MPH  1.5  1.5  2     Grade  0  0  0     Minutes  15  15  15      METs  2  2.1  2.5       NuStep   Level  2  2  2      SPM  96  138  119     Minutes  20  20  20      METs  2.1  2.1  2       Home Exercise Plan   Plans to continue exercise at  Home (comment)  Home (comment)  Home (comment)     Frequency  Add 2 additional days to program exercise sessions.  Add 2 additional days to program exercise sessions.  Add 2 additional days to program exercise sessions.     Initial Home Exercises Provided  06/17/17  06/17/17  06/17/17        Exercise Comments:  Exercise Comments    Row Name 06/17/17 8756 06/23/17 0800 07/08/17 1421       Exercise Comments  Patient recieved the take hom eexercise plan today. THR was addressed as were safety guidelines for exercising when not in CR. Patient demonstrated an understanding and was encouraged to ask any future questions as they arise.   Patient is doing well in CR. He has increased his SPMs on the Nustep machine substantially. He is still new to teh program and will be progressed more in time.   Patient is doing well in CR  and has increased his speed on the treadmill to 2.0. Patient has also maintained high SPMs on the nustep and his level. [place] states that he has not felt much improvement yet although it is only his first 10 sessions. Patient has been to the cardiologist about his high blood pressure.         Exercise Goals and Review:  Exercise Goals    Row Name 06/11/17 [phone]  Exercise Goals   Increase Physical Activity  Yes       Intervention  Provide advice, education, support and counseling about physical activity/exercise needs.;Develop an individualized exercise prescription for aerobic and resistive training based on initial evaluation findings, risk stratification, comorbidities and participant's personal goals.       Expected Outcomes  Short Term: Attend rehab on a regular basis to increase amount of physical activity.       Increase Strength and Stamina  Yes       Intervention  Provide advice, education, support and counseling about physical activity/exercise needs.;Develop an individualized exercise prescription for aerobic and resistive training based on initial evaluation findings, risk stratification, comorbidities and participant's personal goals.       Expected Outcomes  Short Term: Increase workloads from initial exercise prescription for resistance, speed, and METs.       Able to understand and use rate of perceived exertion (RPE) scale  Yes       Intervention  Provide education and explanation on how to use RPE scale       Expected Outcomes  Short Term: Able to use RPE daily in rehab to express subjective intensity level;Long Term:  Able to use RPE to guide intensity level when exercising independently       Able to understand and use Dyspnea scale  Yes       Intervention  Provide education and explanation on how to use Dyspnea scale       Expected Outcomes  Short Term: Able to use Dyspnea scale daily in rehab to express subjective sense of shortness of breath during  exertion;Long Term: Able to use Dyspnea scale to guide intensity level when exercising independently       Knowledge and understanding of Target Heart Rate Range (THRR)  Yes       Intervention  Provide education and explanation of THRR including how the numbers were predicted and where they are located for reference       Expected Outcomes  Short Term: Able to state/look up THRR       Able to check pulse independently  Yes       Intervention  Provide education and demonstration on how to check pulse in carotid and radial arteries.;Review the importance of being able to check your own pulse for safety during independent exercise       Expected Outcomes  Long Term: Able to check pulse independently and accurately;Short Term: Able to explain why pulse checking is important during independent exercise       Understanding of Exercise Prescription  Yes       Intervention  Provide education, explanation, and written materials on patient's individual exercise prescription       Expected Outcomes  Short Term: Able to explain program exercise prescription;Long Term: Able to explain home exercise prescription to exercise independently          Exercise Goals Re-Evaluation : Exercise Goals Re-Evaluation    [place] Name 07/08/17 1419             Exercise Goal Re-Evaluation   Exercise Goals Review  Increase Physical Activity;Increase Strength and Stamina;Knowledge and understanding of Target Heart Rate Range (THRR)       Comments  Patient is doing well in CR and has increased his speed on the treadmill to 2.0. Patient has also maintained high SPMs on the nustep and his level. [place] states that he has not felt much improvement yet although it is only his  first 10 sessions. Patient has been to the cardiologist about his high blood pressure.        Expected Outcomes  Patient wishes to get better and to live a long life.            Discharge Exercise Prescription (Final Exercise Prescription  Changes): Exercise Prescription Changes - 07/08/17 1400      Response to Exercise   Blood Pressure (Admit)  152/80    Blood Pressure (Exercise)  162/82    Blood Pressure (Exit)  136/86    Heart Rate (Admit)  67 bpm    Heart Rate (Exercise)  113 bpm    Heart Rate (Exit)  76 bpm    Rating of Perceived Exertion (Exercise)  10    Duration  Progress to 30 minutes of  aerobic without signs/symptoms of physical distress    Intensity  THRR New [id number]      Progression   Progression  Continue to progress workloads to maintain intensity without signs/symptoms of physical distress.      Resistance Training   Training Prescription  Yes    Weight  2    Reps  10-15      Treadmill   MPH  2    Grade  0    Minutes  15    METs  2.5      NuStep   Level  2    SPM  119    Minutes  20    METs  2      Home Exercise Plan   Plans to continue exercise at  Home (comment)    Frequency  Add 2 additional days to program exercise sessions.    Initial Home Exercises Provided  06/17/17       Nutrition:  Target Goals: Understanding of nutrition guidelines, daily intake of sodium 1500mg , cholesterol 200mg , calories 30% from fat and 7% or less from saturated fats, daily to have 5 or more servings of fruits and vegetables.  Biometrics: Pre Biometrics - 06/11/17 0817      Pre Biometrics   Height  5' 9.5" (1.765 m)    Weight  190 lb 1.6 oz (86.2 kg)    Waist Circumference  40.5 inches    Hip Circumference  40 inches    Waist to Hip Ratio  1.01 %    BMI (Calculated)  27.68    Triceps Skinfold  12 mm    % Body Fat  26.8 %    Grip Strength  58.33 kg    Flexibility  0 in    Single Leg Stand  17 seconds        Nutrition Therapy Plan and Nutrition Goals: Nutrition Therapy & Goals - 07/08/17 1447      Nutrition Therapy   RD appointment deferred  Yes      Personal Nutrition Goals   Personal Goal #2  Patient has changed his diet. he now eats baked meats, low sodium, low fat choices.      Additional Goals?  No       Nutrition Assessments: Nutrition Assessments - 06/11/17 1406      MEDFICTS Scores   Pre Score  38       Nutrition Goals Re-Evaluation:   Nutrition Goals Discharge (Final Nutrition Goals Re-Evaluation):   Psychosocial: Target Goals: Acknowledge presence or absence of significant depression and/or stress, maximize coping skills, provide positive support system. Participant is able to verbalize types and ability to use techniques and skills needed for  reducing stress and depression.  Initial Review & Psychosocial Screening: Initial Psych Review & Screening - 06/11/17 1343      Initial Review   Current issues with  None Identified      Family Dynamics   Good Support System?  Yes      Barriers   Psychosocial barriers to participate in program  There are no identifiable barriers or psychosocial needs.      Screening Interventions   Interventions  Encouraged to exercise    Expected Outcomes  Short Term goal: Identification and review with participant of any Quality of Life or Depression concerns found by scoring the questionnaire.;Long Term goal: The participant improves quality of Life and PHQ9 Scores as seen by post scores and/or verbalization of changes       Quality of Life Scores: Quality of Life - 06/11/17 0816      Quality of Life Scores   Health/Function Pre  24.54 %    Socioeconomic Pre  29.38 %    Psych/Spiritual Pre  30 %    Family Pre  27.6 %    GLOBAL Pre  27.33 %      Scores of 19 and below usually indicate a poorer quality of life in these areas.  A difference of  2-3 points is a clinically meaningful difference.  A difference of 2-3 points in the total score of the Quality of Life Index has been associated with significant improvement in overall quality of life, self-image, physical symptoms, and general health in studies assessing change in quality of life.  PHQ-9: Recent Review Flowsheet Data    Depression screen [facility] 2/9  06/11/2017   Decreased Interest 0   Down, Depressed, Hopeless 0   PHQ - 2 Score 0   Altered sleeping 2   Tired, decreased energy 0   Change in appetite 0   Feeling bad or failure about yourself  0   Trouble concentrating 0   Moving slowly or fidgety/restless 0   Suicidal thoughts 0   PHQ-9 Score 2   Difficult doing work/chores Somewhat difficult     Interpretation of Total Score  Total Score Depression Severity:  1-4 = Minimal depression, 5-9 = Mild depression, 10-14 = Moderate depression, 15-19 = Moderately severe depression, 20-27 = Severe depression   Psychosocial Evaluation and Intervention: Psychosocial Evaluation - 06/11/17 1410      Psychosocial Evaluation & Interventions   Interventions  Encouraged to exercise with the program and follow exercise prescription    Continue Psychosocial Services   No Follow up required       Psychosocial Re-Evaluation: Psychosocial Re-Evaluation    [place] Name 07/08/17 1450             Psychosocial Re-Evaluation   Current issues with  None Identified       Comments  Patient initial QOL was 27.33 and his PHQ-9 score was 2.        Expected Outcomes  Patient will have no psychosocial issues identified at discharge.        Interventions  Encouraged to attend Cardiac Rehabilitation for the exercise;Stress management education;Relaxation education       Continue Psychosocial Services   No Follow up required          Psychosocial Discharge (Final Psychosocial Re-Evaluation): Psychosocial Re-Evaluation - 07/08/17 1450      Psychosocial Re-Evaluation   Current issues with  None Identified    Comments  Patient initial QOL was 27.33 and his PHQ-9 score was 2.  Expected Outcomes  Patient will have no psychosocial issues identified at discharge.     Interventions  Encouraged to attend Cardiac Rehabilitation for the exercise;Stress management education;Relaxation education    Continue Psychosocial Services   No Follow up required        Vocational Rehabilitation: Provide vocational rehab assistance to qualifying candidates.   Vocational Rehab Evaluation & Intervention: Vocational Rehab - 06/11/17 1359      Initial Vocational Rehab Evaluation & Intervention   Assessment shows need for Vocational Rehabilitation  No Still works part-time       Education: Education Goals: Education classes will be provided on a weekly basis, covering required topics. Participant will state understanding/return demonstration of topics presented.  Learning Barriers/Preferences: Learning Barriers/Preferences - 06/11/17 1358      Learning Barriers/Preferences   Learning Barriers  None    Learning Preferences  Group Instruction;Individual Instruction;Pictoral;Skilled Demonstration;Verbal Instruction;Video;Written Material;Computer/Internet;Audio       Education Topics: Hypertension, Hypertension Reduction -Define heart disease and high blood pressure. Discus how high blood pressure affects the body and ways to reduce high blood pressure.   CARDIAC REHAB PHASE II EXERCISE from 07/01/2017 in [place]  Date  07/01/17  Educator  DC  Instruction Review Code  2- Demonstrated Understanding      Exercise and Your Heart -Discuss why it is important to exercise, the FITT principles of exercise, normal and abnormal responses to exercise, and how to exercise safely.   Angina -Discuss definition of angina, causes of angina, treatment of angina, and how to decrease risk of having angina.   Cardiac Medications -Review what the following cardiac medications are used for, how they affect the body, and side effects that may occur when taking the medications.  Medications include Aspirin, Beta blockers, calcium channel blockers, ACE Inhibitors, angiotensin receptor blockers, diuretics, digoxin, and antihyperlipidemics.   Congestive Heart Failure -Discuss the definition of CHF, how to live with CHF, the signs and  symptoms of CHF, and how keep track of weight and sodium intake.   Heart Disease and Intimacy -Discus the effect sexual activity has on the heart, how changes occur during intimacy as we age, and safety during sexual activity.   Smoking Cessation / COPD -Discuss different methods to quit smoking, the health benefits of quitting smoking, and the definition of COPD.   Nutrition I: Fats -Discuss the types of cholesterol, what cholesterol does to the heart, and how cholesterol levels can be controlled.   Nutrition II: Labels -Discuss the different components of food labels and how to read food label   Heart Parts/Heart Disease and PAD -Discuss the anatomy of the heart, the pathway of blood circulation through the heart, and these are affected by heart disease.   Stress I: Signs and Symptoms -Discuss the causes of stress, how stress may lead to anxiety and depression, and ways to limit stress.   Stress II: Relaxation -Discuss different types of relaxation techniques to limit stress.   CARDIAC REHAB PHASE II EXERCISE from 07/01/2017 in [place]  Date  06/17/17  Educator  DC  Instruction Review Code  2- Demonstrated Understanding      Warning Signs of Stroke / TIA -Discuss definition of a stroke, what the signs and symptoms are of a stroke, and how to identify when someone is having stroke.   CARDIAC REHAB PHASE II EXERCISE from 07/01/2017 in [place]  Date  06/24/17  Educator  DC  Instruction Review Code  2- Demonstrated  Understanding      Knowledge Questionnaire Score: Knowledge Questionnaire Score - 06/11/17 1359      Knowledge Questionnaire Score   Pre Score  23/24       Core Components/Risk Factors/Patient Goals at Admission: Personal Goals and Risk Factors at Admission - 06/11/17 1406      Core Components/Risk Factors/Patient Goals on Admission    Weight Management  Weight Maintenance    Personal Goal Other  Yes     Personal Goal  Get better, Live longer and healthier    Intervention  Attend CR 3xweek and supplement with home exercise 2 x week.     Expected Outcomes  Achieve personal goals       Core Components/Risk Factors/Patient Goals Review:  Goals and Risk Factor Review    Row Name 07/08/17 1447             Core Components/Risk Factors/Patient Goals Review   Personal Goals Review  Weight Management/Obesity Get better; live longer and healthier.        Review  Patient  has completed 10 sessions losing 2 lbs. He is doing well in the program with some progression. His has been hypertensive since he started. He say his cardiologist today 2/13 and h e added Lisinopril 5 mg daily. He says he is feeling somewhat stronger but has not really noticed a big difference yet. Will continue to monitor for progress.        Expected Outcomes  Patient will continue to attend sessions and complete the program meeting his personal goals.           Core Components/Risk Factors/Patient Goals at Discharge (Final Review):  Goals and Risk Factor Review - 07/08/17 1447      Core Components/Risk Factors/Patient Goals Review   Personal Goals Review  Weight Management/Obesity Get better; live longer and healthier.     Review  Patient  has completed 10 sessions losing 2 lbs. He is doing well in the program with some progression. His has been hypertensive since he started. He say his cardiologist today 2/13 and h e added Lisinopril 5 mg daily. He says he is feeling somewhat stronger but has not really noticed a big difference yet. Will continue to monitor for progress.     Expected Outcomes  Patient will continue to attend sessions and complete the program meeting his personal goals.        ITP Comments: ITP Comments    Row Name 06/11/17 1400 06/11/17 1528         ITP Comments  Mr. [place] is tarting our CR program on Monday 06/15/17 at 8:15 on Monday and Wednesday and at 3:45 on Friday. Patient has arthritis in his  (R) knee so that he has ocassional pain during exercise. It usually goes away after resting.  Patient is new to the program. He plans to start Monday 06/15/2017.         Comments: ITP 30 Day REVIEW Patient doing well in the program. Will continue to monitor for progress.

## 2017-07-08 NOTE — Progress Notes (Signed)
SUBJECTIVE: The patient presents to establish cardiac care in our [place] office.  This is my first time meeting him.  He has a history of non-STEMI and underwent coronary angiography on 04/24/17.  This demonstrated a distal 100% LAD stenosis.  There was a wire dissection at the lesion due to severe tortuosity.  Serial balloon inflations were performed which restored TIMI II flow and symptoms of chest pain improved.  A stent could not be delivered.  There was no significant remaining epicardial coronary disease.  Troponins peaked at 10.82.  Echocardiogram on 04/24/17 demonstrated normal left ventricular systolic function, LVEF 55-37%, apical dyskinesis, mild LVH, and grade 1 diastolic dysfunction.  He underwent normal ABIs bilaterally on 01/01/12.  Lipids 04/25/17: Total cholesterol 130, triglycerides 47, HDL 49, LDL 72.  He also has chronic kidney disease stage III.  BUN 13, creatinine 1.38 on 05/12/17.  He is feeling well overall.  He has enjoyed participating cardiac rehabilitation.  I reviewed his blood pressure log with several elevated blood pressure readings throughout cardiac rehabilitation.  He occasionally has some mild chest discomfort which spontaneous resolved.  He denies exertional dyspnea, palpitations, leg swelling, orthopnea, and paroxysmal nocturnal dyspnea.  His myalgias have improved since the dose of Lipitor was reduced to 40 mg.  However, he experiences bilateral joint aches in his hands.  He had right knee osteoarthritis prior to his non-STEMI and had chronic pains from this.  However, he has developed new left knee pains since his non-STEMI.  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   Review of Systems: As per "subjective", otherwise negative.  Allergies  Allergen Reactions  . Levaquin [Levofloxacin] Itching    Current Outpatient Medications  Medication Sig Dispense Refill  . aspirin 81  MG EC tablet Take 81 mg by mouth daily.    Marland Kitchen atorvastatin (LIPITOR) 40 MG tablet Take 1 tablet (40 mg total) by mouth daily at 6 PM. 30 tablet 5  . cholecalciferol (VITAMIN D) 1000 UNITS tablet Take 1,000 Units by mouth daily.    . Coenzyme Q10-Vitamin E (QUNOL ULTRA COQ10) 100-150 MG-UNIT CAPS Take 1 capsule by mouth at bedtime.    Marland Kitchen LYRICA 75 MG capsule Take 75 mg by mouth every evening.    . metoprolol succinate (TOPROL-XL) 25 MG 24 hr tablet Take 1 tablet (25 mg total) by mouth daily. 90 tablet 1  . Misc Natural Products (OSTEO BI-FLEX ADV JOINT SHIELD) TABS Take 1 tablet by mouth daily.    . Multiple Vitamins-Minerals (CENTRUM PO) Take 1 tablet by mouth every morning.    . nitroGLYCERIN (NITROSTAT) 0.4 MG SL tablet Place 1 tablet (0.4 mg total) under the tongue every 5 (five) minutes x 3 doses as needed for chest pain. 25 tablet 2  . ticagrelor (BRILINTA) 90 MG TABS tablet Take 1 tablet (90 mg total) by mouth 2 (two) times daily. 180 tablet 2  . trolamine salicylate (ASPERCREME) 10 % cream Apply 1 application topically at bedtime as needed (For arthritis pain in knee).     No current facility-administered medications for this visit.     Past Medical History:  Diagnosis Date  . Arthritis   . 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 COPD (chronic obstructive pulmonary disease) ([place])   . 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 Hypercholesteremia   . Hypertension   . Pulmonary embolism ([place]) 10/2011  Past Surgical History:  Procedure Laterality Date  . ABDOMINAL SURGERY     colon polyp removed 2007- sepsis, coma for 4 months per pt  . COLONOSCOPY  03/22/2010   RMR: 1. Normal rectum 2. Diminutive polyp at the mouth of the ileocecal valve  status post cold biopsy removal. Remaideer of the colonic mucosa and terminal ileum mucosa appeared normal.   . COLONOSCOPY N/A 06/25/2015   Procedure: COLONOSCOPY;  Surgeon: Daneil Dolin, MD;  [facility];  Service: Endoscopy;  Laterality: N/A;  200-rescheduled 1/30 per [place]   . CORONARY BALLOON ANGIOPLASTY N/A 04/24/2017   Procedure: CORONARY BALLOON ANGIOPLASTY;  Surgeon: Jettie Booze, MD;  Location: [place] [facility];  Service: Cardiovascular;  Laterality: N/A;  . LEFT HEART CATH AND CORONARY ANGIOGRAPHY N/A 04/24/2017   Procedure: LEFT HEART CATH AND CORONARY ANGIOGRAPHY;  Surgeon: Jettie Booze, MD;  Location: [place] [facility];  Service: Cardiovascular;  Laterality: N/A;  . LUMBAR LAMINECTOMY  06/02/2012  . LUMBAR LAMINECTOMY/DECOMPRESSION MICRODISCECTOMY  06/02/2012   Procedure: LUMBAR LAMINECTOMY/DECOMPRESSION MICRODISCECTOMY 1 LEVEL;  Surgeon: Floyce Stakes, MD;  Location: [place] [facility];  Service: Neurosurgery;  Laterality: Bilateral;  Bilateral Lumbar five-sacral one Foraminotomy and microdiskectomy     Social History   Socioeconomic History  . Marital status: Married    Spouse name: Not on file  . Number of children: 1  . Years of education: Not on file  . Highest education level: Not on file  Social Needs  . Financial resource strain: Not on file  . Food insecurity - worry: Not on file  . Food insecurity - inability: Not on file  . Transportation needs - medical: Not on file  . Transportation needs - non-medical: Not on file  Occupational History  . Occupation: retired  Tobacco Use  . Smoking status: Former Smoker    Packs/day: 0.10    Years: 20.00    Pack years: 2.00    Types: Cigarettes    Last attempt to quit: 04/25/2004    Years since quitting: 13.2  . Smokeless tobacco: Never Used  Substance and Sexual Activity  . Alcohol use: No  . Drug use: No  . Sexual activity: Not on file  Other Topics Concern  . Not on file  Social History Narrative   Patient is right handed.   Patient drinks caffeine occasionally     Vitals:   07/08/17 0820  BP: (!) 144/82  Pulse: 74  SpO2: 97%  Weight: 188 lb 9.6 oz (85.5 kg)  Height: 5' 9.5" (1.765  m)    Wt Readings from Last 3 Encounters:  07/08/17 188 lb 9.6 oz (85.5 kg)  06/11/17 190 lb 1.6 oz (86.2 kg)  05/12/17 198 lb (89.8 kg)     PHYSICAL EXAM General: NAD HEENT: Normal. Neck: No JVD, no thyromegaly. Lungs: Clear to auscultation bilaterally with normal respiratory effort. CV: Regular rate and rhythm, normal S1/S2, no S3/S4, no murmur. No pretibial or periankle edema.  No carotid bruit.   Abdomen: Soft, nontender, no distention.  Neurologic: Alert and oriented.  Psych: Normal affect. Skin: Normal. Musculoskeletal: No gross deformities.    ECG: Most recent ECG reviewed.   Labs: Lab Results  Component Value Date/Time   K 5.0 05/12/2017 03:28 PM   BUN 13 05/12/2017 03:28 PM   CREATININE 1.38 (H) 05/12/2017 03:28 PM   ALT 26 04/24/2017 04:02 AM   HGB 11.6 (L) 04/27/2017 05:48 AM     Lipids: Lab  Results  Component Value Date/Time   LDLCALC 72 04/25/2017 05:30 AM   CHOL 130 04/25/2017 05:30 AM   TRIG 47 04/25/2017 05:30 AM   HDL 49 04/25/2017 05:30 AM       ASSESSMENT AND PLAN: 1.  Coronary artery disease with non-STEMI status post balloon angioplasty of distal LAD November 2018: Continue dual antiplatelet therapy for minimum of 1 year with aspirin and Brilinta.  Continue additional medical therapy with metoprolol.  I am starting lisinopril 5 mg daily for blood pressure control.  Due to continued joint pains, I will switch Lipitor 40 mg to Crestor 20 mg.  I will repeat lipids in 2 months.  2.  Chronic kidney disease stage III: Recent labs reviewed above.  This appears to be stable.  As I am starting lisinopril 5 mg daily, I will check a basic metabolic panel within 3-5 days of initiation.  3.  Hyperlipidemia: Most recent lipids reviewed above. Due to continued joint pains, I will switch Lipitor 40 mg to Crestor 20 mg.  I will repeat lipids in 2 months.  4.  Hypertension: Blood pressure is mildly elevated and he has had several elevated readings throughout  cardiac rehabilitation.  I will start lisinopril 5 mg daily as ace inhibitors are indicated and chronic kidney disease stage III.  I will check a basic metabolic panel within 3-5 days of initiation.   Disposition: Follow up 3 months   Kate Sable, M.D., F.A.C.C.

## 2017-07-08 NOTE — Patient Instructions (Signed)
Medication Instructions:  Start lisinopril 5 mg - once daily  Start Crestor 20 mg - once daily  STOP LIPITOR   Labwork: 3-5 DAYS (BMET)   2 MONTHS (FASTING LIPID)  Testing/Procedures: NONE  Follow-Up: Your physician recommends that you schedule a follow-up appointment in: 3 MONTHS    Any Other Special Instructions Will Be Listed Below (If Applicable).     If you need a refill on your cardiac medications before your next appointment, please call your pharmacy.

## 2017-07-10 ENCOUNTER — Encounter (HOSPITAL_COMMUNITY)
Admission: RE | Admit: 2017-07-10 | Discharge: 2017-07-10 | Disposition: A | Discharge status: Home / Self Care | Payer: Medicare Other | Source: Ambulatory Visit | Attending: Cardiovascular Disease | Admitting: Cardiovascular Disease

## 2017-07-10 DIAGNOSIS — Z9861 Coronary angioplasty status: Secondary | ICD-10-CM

## 2017-07-10 DIAGNOSIS — I214 Non-ST elevation (NSTEMI) myocardial infarction: Secondary | ICD-10-CM | POA: Diagnosis not present

## 2017-07-10 NOTE — Progress Notes (Signed)
Daily Session Note  Patient Details  Name: Kyle Patel. MRN: [id number] Date of Birth: [birth year] Referring Provider:     CARDIAC REHAB PHASE II ORIENTATION from 06/11/2017 in [place]  Referring Provider  Dr. Irish Lack      Encounter Date: 07/10/2017  Check In: Session Check In - 07/10/17 0815      Check-In   Location  [facility]    Staff Present  Russella Dar, MS, EP, [facility], Exercise Physiologist;Dayshaun Whobrey Wynetta Emery, RN, BSN;Gregory Cowan, BS, EP, Exercise Physiologist    Supervising physician immediately available to respond to emergencies  See telemetry face sheet for immediately available MD    Medication changes reported      Yes    Comments  Cardiologist discontinued Lipitor and added Crestor daily and added Lisinopril 5 mg daily Wednesday 07/08/17.    Fall or balance concerns reported     No    Warm-up and Cool-down  Performed as group-led instruction    Resistance Training Performed  Yes    VAD Patient?  No      Pain Assessment   Currently in Pain?  No/denies    Pain Score  0-No pain    Multiple Pain Sites  No       Capillary Blood Glucose: No results found for this or any previous visit (from the past 24 hour(s)).    Social History   Tobacco Use  Smoking Status Former Smoker  . Packs/day: 0.10  . Years: 20.00  . Pack years: 2.00  . Types: Cigarettes  . Last attempt to quit: 04/25/2004  . Years since quitting: 13.2  Smokeless Tobacco Never Used    Goals Met:  Independence with exercise equipment Exercise tolerated well No report of cardiac concerns or symptoms Strength training completed today  Goals Unmet:  Not Applicable  Comments: Check out 915.   Dr. Kate Sable is Medical Director for [facility] Cardiac and Pulmonary Rehab.

## 2017-07-13 ENCOUNTER — Encounter (HOSPITAL_COMMUNITY)
Admission: RE | Admit: 2017-07-13 | Discharge: 2017-07-13 | Disposition: A | Discharge status: Home / Self Care | Payer: Medicare Other | Source: Ambulatory Visit | Attending: Cardiovascular Disease | Admitting: Cardiovascular Disease

## 2017-07-13 DIAGNOSIS — Z9861 Coronary angioplasty status: Secondary | ICD-10-CM

## 2017-07-13 DIAGNOSIS — I214 Non-ST elevation (NSTEMI) myocardial infarction: Secondary | ICD-10-CM

## 2017-07-13 NOTE — Progress Notes (Signed)
Daily Session Note  Patient Details  Name: Kyle Patel. MRN: [id number] Date of Birth: [birth year] Referring Provider:     CARDIAC REHAB PHASE II ORIENTATION from 06/11/2017 in [place]  Referring Provider  Dr. Irish Lack      Encounter Date: 07/13/2017  Check In: Session Check In - 07/13/17 0824      Check-In   Location  [facility]    Staff Present  Russella Dar, MS, EP, [facility], Exercise Physiologist;Debra Wynetta Emery, RN, BSN;Eun Vermeer, BS, EP, Exercise Physiologist    Supervising physician immediately available to respond to emergencies  See telemetry face sheet for immediately available MD    Medication changes reported      No    Fall or balance concerns reported     No    Tobacco Cessation  No Change    Warm-up and Cool-down  Performed as group-led instruction    Resistance Training Performed  Yes    VAD Patient?  No      Pain Assessment   Currently in Pain?  No/denies    Pain Score  0-No pain    Multiple Pain Sites  No       Capillary Blood Glucose: No results found for this or any previous visit (from the past 24 hour(s)).    Social History   Tobacco Use  Smoking Status Former Smoker  . Packs/day: 0.10  . Years: 20.00  . Pack years: 2.00  . Types: Cigarettes  . Last attempt to quit: 04/25/2004  . Years since quitting: 13.2  Smokeless Tobacco Never Used    Goals Met:  Independence with exercise equipment Exercise tolerated well No report of cardiac concerns or symptoms Strength training completed today  Goals Unmet:  Not Applicable  Comments: Check out 915   Dr. Kate Sable is Medical Director for [place] and Pulmonary Rehab.

## 2017-07-15 ENCOUNTER — Encounter (HOSPITAL_COMMUNITY)
Admission: RE | Admit: 2017-07-15 | Discharge: 2017-07-15 | Disposition: A | Discharge status: Home / Self Care | Payer: Medicare Other | Source: Ambulatory Visit | Attending: Cardiovascular Disease | Admitting: Cardiovascular Disease

## 2017-07-15 DIAGNOSIS — I214 Non-ST elevation (NSTEMI) myocardial infarction: Secondary | ICD-10-CM | POA: Diagnosis not present

## 2017-07-15 DIAGNOSIS — Z9861 Coronary angioplasty status: Secondary | ICD-10-CM

## 2017-07-15 NOTE — Progress Notes (Signed)
Daily Session Note  Patient Details  Name: Kyle Patel. MRN: [id number] Date of Birth: 1945-09-20 Referring Provider:     CARDIAC REHAB PHASE II ORIENTATION from 06/11/2017 in [place]  Referring Provider  Dr. Irish Lack      Encounter Date: 07/15/2017  Check In: Session Check In - 07/15/17 0815      Check-In   Location  [facility]    Staff Present  Diane Angelina Pih, MS, EP, [facility], Exercise Physiologist;Marguerita Stapp Wynetta Emery, RN, BSN;Gregory Cowan, BS, EP, Exercise Physiologist    Supervising physician immediately available to respond to emergencies  See telemetry face sheet for immediately available MD    Medication changes reported      No    Fall or balance concerns reported     No    Warm-up and Cool-down  Performed as group-led instruction    Resistance Training Performed  Yes    VAD Patient?  No      Pain Assessment   Currently in Pain?  No/denies    Pain Score  0-No pain    Multiple Pain Sites  No       Capillary Blood Glucose: No results found for this or any previous visit (from the past 24 hour(s)).    Social History   Tobacco Use  Smoking Status Former Smoker  . Packs/day: 0.10  . Years: 20.00  . Pack years: 2.00  . Types: Cigarettes  . Last attempt to quit: 04/25/2004  . Years since quitting: 13.2  Smokeless Tobacco Never Used    Goals Met:  Independence with exercise equipment Exercise tolerated well No report of cardiac concerns or symptoms Strength training completed today  Goals Unmet:  Not Applicable  Comments: Check out 915.   Dr. Kate Sable is Medical Director for [facility] Cardiac and Pulmonary Rehab.

## 2017-07-17 ENCOUNTER — Encounter (HOSPITAL_COMMUNITY)
Admission: RE | Admit: 2017-07-17 | Discharge: 2017-07-17 | Disposition: A | Discharge status: Home / Self Care | Payer: Medicare Other | Source: Ambulatory Visit | Attending: Cardiovascular Disease | Admitting: Cardiovascular Disease

## 2017-07-17 DIAGNOSIS — I214 Non-ST elevation (NSTEMI) myocardial infarction: Secondary | ICD-10-CM | POA: Diagnosis not present

## 2017-07-17 DIAGNOSIS — Z9861 Coronary angioplasty status: Secondary | ICD-10-CM

## 2017-07-17 NOTE — Progress Notes (Signed)
Daily Session Note  Patient Details  Name: Kyle Patel. MRN: [id number] Date of Birth: 1945/10/31 Referring Provider:     CARDIAC REHAB PHASE II ORIENTATION from 06/11/2017 in [place]  Referring Provider  Dr. Irish Lack      Encounter Date: 07/17/2017  Check In: Session Check In - 07/17/17 0810      Check-In   Location  [facility]    Staff Present  Russella Dar, MS, EP, [facility], Exercise Physiologist;Debra Wynetta Emery, RN, BSN;Laterrian Hevener, BS, EP, Exercise Physiologist    Supervising physician immediately available to respond to emergencies  See telemetry face sheet for immediately available MD    Medication changes reported      No    Fall or balance concerns reported     No    Tobacco Cessation  No Change    Warm-up and Cool-down  Performed as group-led instruction    Resistance Training Performed  Yes    VAD Patient?  No      Pain Assessment   Currently in Pain?  No/denies    Pain Score  0-No pain    Multiple Pain Sites  No       Capillary Blood Glucose: No results found for this or any previous visit (from the past 24 hour(s)).    Social History   Tobacco Use  Smoking Status Former Smoker  . Packs/day: 0.10  . Years: 20.00  . Pack years: 2.00  . Types: Cigarettes  . Last attempt to quit: 04/25/2004  . Years since quitting: 13.2  Smokeless Tobacco Never Used    Goals Met:  Independence with exercise equipment Exercise tolerated well No report of cardiac concerns or symptoms Strength training completed today  Goals Unmet:  Not Applicable  Comments: Check out 915   Dr. Kate Sable is Medical Director for [place] and Pulmonary Rehab.

## 2017-07-20 ENCOUNTER — Encounter (HOSPITAL_COMMUNITY)
Admission: RE | Admit: 2017-07-20 | Discharge: 2017-07-20 | Disposition: A | Discharge status: Home / Self Care | Payer: Medicare Other | Source: Ambulatory Visit | Attending: Cardiovascular Disease | Admitting: Cardiovascular Disease

## 2017-07-20 DIAGNOSIS — I214 Non-ST elevation (NSTEMI) myocardial infarction: Secondary | ICD-10-CM

## 2017-07-20 DIAGNOSIS — Z9861 Coronary angioplasty status: Secondary | ICD-10-CM

## 2017-07-20 NOTE — Progress Notes (Signed)
Daily Session Note  Patient Details  Name: Kyle Patel. MRN: [id number] Date of Birth: 07-Oct-1945 Referring Provider:     CARDIAC REHAB PHASE II ORIENTATION from 06/11/2017 in [place]  Referring Provider  Dr. Irish Lack      Encounter Date: 07/20/2017  Check In: Session Check In - 07/20/17 1545      Check-In   Location  [facility]    Staff Present  Diane Angelina Pih, MS, EP, [facility], Exercise Physiologist;Swayzie Choate Wynetta Emery, RN, BSN;Gregory Cowan, BS, EP, Exercise Physiologist    Supervising physician immediately available to respond to emergencies  See telemetry face sheet for immediately available MD    Medication changes reported      No    Fall or balance concerns reported     No    Warm-up and Cool-down  Performed as group-led instruction    Resistance Training Performed  Yes    VAD Patient?  No      Pain Assessment   Currently in Pain?  No/denies    Pain Score  0-No pain    Multiple Pain Sites  No       Capillary Blood Glucose: No results found for this or any previous visit (from the past 24 hour(s)).    Social History   Tobacco Use  Smoking Status Former Smoker  . Packs/day: 0.10  . Years: 20.00  . Pack years: 2.00  . Types: Cigarettes  . Last attempt to quit: 04/25/2004  . Years since quitting: 13.2  Smokeless Tobacco Never Used    Goals Met:  Independence with exercise equipment Exercise tolerated well No report of cardiac concerns or symptoms Strength training completed today  Goals Unmet:  Not Applicable  Comments: Check out 1645.   Dr. Kate Sable is Medical Director for [facility] Cardiac and Pulmonary Rehab.

## 2017-07-21 ENCOUNTER — Other Ambulatory Visit (HOSPITAL_COMMUNITY)
Admission: RE | Admit: 2017-07-21 | Discharge: 2017-07-21 | Disposition: A | Discharge status: Home / Self Care | Payer: Medicare Other | Source: Ambulatory Visit | Attending: Cardiovascular Disease | Admitting: Cardiovascular Disease

## 2017-07-21 ENCOUNTER — Other Ambulatory Visit (HOSPITAL_COMMUNITY): Admission: RE | Admit: 2017-07-21 | Payer: Medicare Other | Source: Ambulatory Visit | Admitting: Cardiovascular Disease

## 2017-07-21 DIAGNOSIS — Z79899 Other long term (current) drug therapy: Secondary | ICD-10-CM | POA: Diagnosis present

## 2017-07-21 DIAGNOSIS — E785 Hyperlipidemia, unspecified: Secondary | ICD-10-CM | POA: Insufficient documentation

## 2017-07-21 LAB — BASIC METABOLIC PANEL
ANION GAP: 8 (ref 5–15)
BUN: 18 mg/dL (ref 6–20)
CALCIUM: 9.4 mg/dL (ref 8.9–10.3)
CHLORIDE: 109 mmol/L (ref 101–111)
CO2: 21 mmol/L — ABNORMAL LOW (ref 22–32)
CREATININE: 1.46 mg/dL — AB (ref 0.61–1.24)
GFR calc non Af Amer: 47 mL/min — ABNORMAL LOW (ref >60)
GFR, EST AFRICAN AMERICAN: 55 mL/min — AB (ref >60)
Glucose, Bld: 115 mg/dL — ABNORMAL HIGH (ref 65–99)
Potassium: 4.4 mmol/L (ref 3.5–5.1)
SODIUM: 138 mmol/L (ref 135–145)

## 2017-07-21 LAB — LIPID PANEL
CHOLESTEROL: 147 mg/dL (ref 0–200)
HDL: 49 mg/dL (ref >40)
LDL Cholesterol: 81 mg/dL (ref 0–99)
TRIGLYCERIDES: 83 mg/dL (ref <150)
Total CHOL/HDL Ratio: 3 RATIO
VLDL: 17 mg/dL (ref 0–40)

## 2017-07-22 ENCOUNTER — Encounter (HOSPITAL_COMMUNITY)
Admission: RE | Admit: 2017-07-22 | Discharge: 2017-07-22 | Disposition: A | Discharge status: Home / Self Care | Payer: Medicare Other | Source: Ambulatory Visit | Attending: Cardiovascular Disease | Admitting: Cardiovascular Disease

## 2017-07-22 ENCOUNTER — Telehealth: Payer: Self-pay

## 2017-07-22 DIAGNOSIS — Z9861 Coronary angioplasty status: Secondary | ICD-10-CM

## 2017-07-22 DIAGNOSIS — I214 Non-ST elevation (NSTEMI) myocardial infarction: Secondary | ICD-10-CM

## 2017-07-22 NOTE — Progress Notes (Signed)
Daily Session Note  Patient Details  Name: Kyle Patel. MRN: [id number] Date of Birth: 13-Jul-1945 Referring Provider:     CARDIAC REHAB PHASE II ORIENTATION from 06/11/2017 in [place]  Referring Provider  Dr. Irish Lack      Encounter Date: 07/22/2017  Check In: Session Check In - 07/22/17 0815      Check-In   [facility]    Staff Present  Diane Angelina Pih, MS, EP, [facility], Exercise Physiologist;Donnarae Rae Wynetta Emery, RN, BSN;Gregory Cowan, BS, EP, Exercise Physiologist    Supervising physician immediately available to respond to emergencies  See telemetry face sheet for immediately available MD    Medication changes reported      No    Fall or balance concerns reported     No    Warm-up and Cool-down  Performed as group-led instruction    Resistance Training Performed  Yes    VAD Patient?  No      Pain Assessment   Currently in Pain?  No/denies    Pain Score  0-No pain    Multiple Pain Sites  No       Capillary Blood Glucose: No results found for this or any previous visit (from the past 24 hour(s)).    Social History   Tobacco Use  Smoking Status Former Smoker  . Packs/day: 0.10  . Years: 20.00  . Pack years: 2.00  . Types: Cigarettes  . Last attempt to quit: 04/25/2004  . Years since quitting: 13.2  Smokeless Tobacco Never Used    Goals Met:  Independence with exercise equipment Exercise tolerated well No report of cardiac concerns or symptoms Strength training completed today  Goals Unmet:  Not Applicable  Comments: Check out 915.   Dr. Kate Sable is Medical Director for [facility] Cardiac and Pulmonary Rehab.

## 2017-07-22 NOTE — Telephone Encounter (Signed)
-----   Message from Laurine Blazer, LPN sent at 5/82/5189  8:33 AM EST -----   ----- Message ----- From: Acquanetta Chain, LPN Sent: 8/42/1031  10:07 AM To: Laurine Blazer, LPN    ----- Message ----- From: Herminio Commons, MD Sent: 07/21/2017  10:03 AM To: Acquanetta Chain, LPN  Lipids are normal. CKD stage III is stable.

## 2017-07-22 NOTE — Telephone Encounter (Signed)
Called pt. NO answer, left message for pt to return call.  

## 2017-07-24 ENCOUNTER — Encounter (HOSPITAL_COMMUNITY)
Admission: RE | Admit: 2017-07-24 | Discharge: 2017-07-24 | Disposition: A | Discharge status: Home / Self Care | Payer: Medicare Other | Source: Ambulatory Visit | Attending: Cardiovascular Disease | Admitting: Cardiovascular Disease

## 2017-07-24 DIAGNOSIS — Z955 Presence of coronary angioplasty implant and graft: Secondary | ICD-10-CM | POA: Diagnosis present

## 2017-07-24 DIAGNOSIS — Z9861 Coronary angioplasty status: Secondary | ICD-10-CM

## 2017-07-24 DIAGNOSIS — I214 Non-ST elevation (NSTEMI) myocardial infarction: Secondary | ICD-10-CM | POA: Diagnosis present

## 2017-07-24 NOTE — Progress Notes (Signed)
Daily Session Note  Patient Details  Name: Kyle Patel. MRN: [id number] Date of Birth: 1945/06/16 Referring Provider:     CARDIAC REHAB PHASE II ORIENTATION from 06/11/2017 in [place]  Referring Provider  Dr. Irish Lack      Encounter Date: 07/24/2017  Check In: Session Check In - 07/24/17 0815      Check-In   Location  [facility]    Staff Present  Diane Angelina Pih, MS, EP, [facility], Exercise Physiologist;Marik Sedore Wynetta Emery, RN, BSN;Gregory Cowan, BS, EP, Exercise Physiologist    Supervising physician immediately available to respond to emergencies  See telemetry face sheet for immediately available MD    Medication changes reported      No    Fall or balance concerns reported     No    Warm-up and Cool-down  Performed as group-led instruction    Resistance Training Performed  Yes    VAD Patient?  No      Pain Assessment   Currently in Pain?  No/denies    Pain Score  0-No pain    Multiple Pain Sites  No       Capillary Blood Glucose: No results found for this or any previous visit (from the past 24 hour(s)).  Exercise Prescription Changes - 07/23/17 1200      Response to Exercise   Blood Pressure (Admit)  152/72    Blood Pressure (Exercise)  170/84    Blood Pressure (Exit)  150/60    Heart Rate (Admit)  63 bpm    Heart Rate (Exercise)  121 bpm    Heart Rate (Exit)  80 bpm    Rating of Perceived Exertion (Exercise)  10    Duration  Progress to 30 minutes of  aerobic without signs/symptoms of physical distress    Intensity  TH[facility] New [phone]      Progression   Progression  Continue to progress workloads to maintain intensity without signs/symptoms of physical distress.      Resistance Training   Training Prescription  Yes    Weight  3    Reps  10-15      Treadmill   MPH  2.2    Grade  0    Minutes  15    METs  2.7      NuStep   Level  3    SPM  169    Minutes  20    METs  2.1      Home Exercise Plan   Plans to continue  exercise at  Home (comment)    Frequency  Add 2 additional days to program exercise sessions.    Initial Home Exercises Provided  06/17/17       Social History   Tobacco Use  Smoking Status Former Smoker  . Packs/day: 0.10  . Years: 20.00  . Pack years: 2.00  . Types: Cigarettes  . Last attempt to quit: 04/25/2004  . Years since quitting: 13.2  Smokeless Tobacco Never Used    Goals Met:  Independence with exercise equipment Exercise tolerated well No report of cardiac concerns or symptoms Strength training completed today  Goals Unmet:  Not Applicable  Comments: Check out 915.   Dr. Kate Sable is Medical Director for [facility] Cardiac and Pulmonary Rehab.

## 2017-07-27 ENCOUNTER — Encounter (HOSPITAL_COMMUNITY)
Admission: RE | Admit: 2017-07-27 | Discharge: 2017-07-27 | Disposition: A | Discharge status: Home / Self Care | Payer: Medicare Other | Source: Ambulatory Visit | Attending: Cardiovascular Disease | Admitting: Cardiovascular Disease

## 2017-07-27 DIAGNOSIS — I214 Non-ST elevation (NSTEMI) myocardial infarction: Secondary | ICD-10-CM | POA: Diagnosis not present

## 2017-07-27 DIAGNOSIS — Z9861 Coronary angioplasty status: Secondary | ICD-10-CM

## 2017-07-27 NOTE — Progress Notes (Signed)
Cardiac Individual Treatment Plan  Patient Details  Name: Kyle Patel. MRN: [id number] Date of Birth: 1945/06/27 Referring Provider:     CARDIAC REHAB PHASE II ORIENTATION from 06/11/2017 in [place]  Referring Provider  Dr. Irish Lack      Initial Encounter Date:    CARDIAC REHAB PHASE II ORIENTATION from 06/11/2017 in [place]  Date  06/11/17  Referring Provider  Dr. Irish Lack      Visit Diagnosis: NSTEMI (non-ST elevated myocardial infarction) ([facility])  S/P PTCA (percutaneous transluminal coronary angioplasty)  Patient's Home Medications on Admission:  Current Outpatient Medications:  .  aspirin 81 MG EC tablet, Take 81 mg by mouth daily., Disp: , Rfl:  .  cholecalciferol (VITAMIN D) 1000 UNITS tablet, Take 1,000 Units by mouth daily., Disp: , Rfl:  .  Coenzyme Q10-Vitamin E (QUNOL ULTRA COQ10) 100-150 MG-UNIT CAPS, Take 1 capsule by mouth at bedtime., Disp: , Rfl:  .  lisinopril (PRINIVIL,ZESTRIL) 5 MG tablet, Take 1 tablet (5 mg total) by mouth daily., Disp: 90 tablet, Rfl: 3 .  LYRICA 75 MG capsule, Take 75 mg by mouth every evening., Disp: , Rfl:  .  metoprolol succinate (TOPROL-XL) 25 MG 24 hr tablet, Take 1 tablet (25 mg total) by mouth daily., Disp: 90 tablet, Rfl: 1 .  Misc Natural Products (OSTEO BI-FLEX ADV JOINT SHIELD) TABS, Take 1 tablet by mouth daily., Disp: , Rfl:  .  Multiple Vitamins-Minerals (CENTRUM PO), Take 1 tablet by mouth every morning., Disp: , Rfl:  .  nitroGLYCERIN (NITROSTAT) 0.4 MG SL tablet, Place 1 tablet (0.4 mg total) under the tongue every 5 (five) minutes x 3 doses as needed for chest pain., Disp: 25 tablet, Rfl: 2 .  rosuvastatin (CRESTOR) 20 MG tablet, Take 1 tablet (20 mg total) by mouth daily., Disp: 90 tablet, Rfl: 3 .  ticagrelor (BRILINTA) 90 MG TABS tablet, Take 1 tablet (90 mg total) by mouth 2 (two) times daily., Disp: 180 tablet, Rfl: 2 .  trolamine salicylate (ASPERCREME) 10 % cream,  Apply 1 application topically at bedtime as needed (For arthritis pain in knee)., Disp: , Rfl:   Past Medical History: Past Medical History:  Diagnosis Date  . Arthritis   . 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 COPD (chronic obstructive pulmonary disease) ([place])   . 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 Hypercholesteremia   . Hypertension   . Pulmonary embolism ([place]) 10/2011    Tobacco Use: Social History   Tobacco Use  Smoking Status Former Smoker  . Packs/day: 0.10  . Years: 20.00  . Pack years: 2.00  . Types: Cigarettes  . Last attempt to quit: 04/25/2004  . Years since quitting: 13.2  Smokeless Tobacco Never Used    Labs: Recent Review Scientist, physiological    Labs for ITP Cardiac and Pulmonary Rehab Latest Ref Rng & Units 04/24/2017 04/25/2017 07/21/2017   Cholestrol 0 - 200 mg/dL - 130 147   LDLCALC 0 - 99 mg/dL - 72 81   HDL >40 mg/dL - 49 49   Trlycerides <150 mg/dL - 47 83   Hemoglobin A1c 4.8 - 5.6 % 6.4(H) - -      Capillary Blood Glucose: Lab Results  Component Value Date   GLUCAP 114 (H) 11/18/2011   GLUCAP 124 (H) 11/16/2011   GLUCAP 102 (H) 11/10/2011     Exercise Target Goals:    Exercise Program Goal: Individual exercise prescription set  using results from initial 6 min walk test and THRR while considering  patient's activity barriers and safety.   Exercise Prescription Goal: Starting with aerobic activity 30 plus minutes a day, 3 days per week for initial exercise prescription. Provide home exercise prescription and guidelines that participant acknowledges understanding prior to discharge.  Activity Barriers & Risk Stratification: Activity Barriers & Cardiac Risk Stratification - 06/11/17 0818      Activity Barriers & Cardiac Risk Stratification   Cardiac Risk Stratification  High       6 Minute Walk: 6 Minute Walk    Row Name 06/11/17 0932         6 Minute Walk   Phase  Initial     Distance  1250 feet      Distance % Change  0 %     Distance Feet Change  0 ft     Walk Time  6 minutes     # of Rest Breaks  0     MPH  2.36     METS  2.81     RPE  11     Perceived Dyspnea   11     VO2 Peak  11.51     Symptoms  No     Resting HR  93 bpm     Resting BP  144/84     Resting Oxygen Saturation   95 %     Exercise Oxygen Saturation  during 6 min walk  93 %     Max Ex. HR  121 bpm     Max Ex. BP  152/84     2 Minute Post BP  136/80        Oxygen Initial Assessment: Oxygen Initial Assessment - 06/11/17 1403      Home Oxygen   Home Oxygen Device  None    Sleep Oxygen Prescription  None    Home Exercise Oxygen Prescription  None    Home at Rest Exercise Oxygen Prescription  None      Initial 6 min Walk   Oxygen Used  None       Oxygen Re-Evaluation:   Oxygen Discharge (Final Oxygen Re-Evaluation):   Initial Exercise Prescription: Initial Exercise Prescription - 06/11/17 0800      Date of Initial Exercise RX and Referring Provider   Date  06/11/17    Referring Provider  Dr. Irish Lack      Treadmill   MPH  1.5    Grade  0    Minutes  15    METs  2      NuStep   Level  2    SPM  96    Minutes  20    METs  2.1      Prescription Details   Frequency (times per week)  3    Duration  Progress to 30 minutes of continuous aerobic without signs/symptoms of physical distress      Intensity   THRR 40-80% of Max Heartrate  [phone]    Ratings of Perceived Exertion  11-13    Perceived Dyspnea  0-4      Progression   Progression  Continue progressive overload as per policy without signs/symptoms or physical distress.      Resistance Training   Training Prescription  Yes    Weight  1    Reps  10-15       Perform Capillary Blood Glucose checks as needed.  Exercise Prescription Changes:  Exercise Prescription Changes  Row Name 06/17/17 0800 06/23/17 0700 07/08/17 1400 07/23/17 1200       Response to Exercise   Blood Pressure (Admit)  -  150/82  152/80   152/72    Blood Pressure (Exercise)  -  160/82  162/82  170/84    Blood Pressure (Exit)  -  142/80  136/86  150/60    Heart Rate (Admit)  -  68 bpm  67 bpm  63 bpm    Heart Rate (Exercise)  -  104 bpm  113 bpm  121 bpm    Heart Rate (Exit)  -  76 bpm  76 bpm  80 bpm    Rating of Perceived Exertion (Exercise)  -  11  10  10     Duration  -  Progress to 30 minutes of  aerobic without signs/symptoms of physical distress  Progress to 30 minutes of  aerobic without signs/symptoms of physical distress  Progress to 30 minutes of  aerobic without signs/symptoms of physical distress    Intensity  -  THRR New [phone]  THRR New 101-117-134  THRR New [phone]      Progression   Progression  -  Continue to progress workloads to maintain intensity without signs/symptoms of physical distress.  Continue to progress workloads to maintain intensity without signs/symptoms of physical distress.  Continue to progress workloads to maintain intensity without signs/symptoms of physical distress.      Resistance Training   Training Prescription  Yes  Yes  Yes  Yes    Weight  1  1  2  3     Reps  10-15  10-15  10-15  10-15      Treadmill   MPH  1.5  1.5  2  2.2    Grade  0  0  0  0    Minutes  15  15  15  15     METs  2  2.1  2.5  2.7      NuStep   Level  2  2  2  3     SPM  96  138  119  169    Minutes  20  20  20  20     METs  2.1  2.1  2  2.1      Home Exercise Plan   Plans to continue exercise at  Home (comment)  Home (comment)  Home (comment)  Home (comment)    Frequency  Add 2 additional days to program exercise sessions.  Add 2 additional days to program exercise sessions.  Add 2 additional days to program exercise sessions.  Add 2 additional days to program exercise sessions.    Initial Home Exercises Provided  06/17/17  06/17/17  06/17/17  06/17/17       Exercise Comments:  Exercise Comments    Row Name 06/17/17 1941 06/23/17 0800 07/08/17 1421 07/23/17 1235     Exercise Comments  Patient  recieved the take hom eexercise plan today. THR was addressed as were safety guidelines for exercising when not in CR. Patient demonstrated an understanding and was encouraged to ask any future questions as they arise.   Patient is doing well in CR. He has increased his SPMs on the Nustep machine substantially. He is still new to teh program and will be progressed more in time.   Patient is doing well in CR and has increased his speed on the treadmill to 2.0. Patient has also maintained high SPMs on the nustep and  his level. Patinet states that he has not felt much improvement yet although it is only his first 10 sessions. Patient has been to the cardiologist about his high blood pressure.   Patient is doing well in CR and has progressed both on the treadmill speed to 2.2 and the Nustep level to level three. Patient states that he feels better overall but is still weak and is concerned about his BP being too high. Patient will be progressed overtime and will be monitored throughout the program.       Exercise Goals and Review:  Exercise Goals    Row Name 06/11/17 9937             Exercise Goals   Increase Physical Activity  Yes       Intervention  Provide advice, education, support and counseling about physical activity/exercise needs.;Develop an individualized exercise prescription for aerobic and resistive training based on initial evaluation findings, risk stratification, comorbidities and participant's personal goals.       Expected Outcomes  Short Term: Attend rehab on a regular basis to increase amount of physical activity.       Increase Strength and Stamina  Yes       Intervention  Provide advice, education, support and counseling about physical activity/exercise needs.;Develop an individualized exercise prescription for aerobic and resistive training based on initial evaluation findings, risk stratification, comorbidities and participant's personal goals.       Expected Outcomes  Short  Term: Increase workloads from initial exercise prescription for resistance, speed, and METs.       Able to understand and use rate of perceived exertion (RPE) scale  Yes       Intervention  Provide education and explanation on how to use RPE scale       Expected Outcomes  Short Term: Able to use RPE daily in rehab to express subjective intensity level;Long Term:  Able to use RPE to guide intensity level when exercising independently       Able to understand and use Dyspnea scale  Yes       Intervention  Provide education and explanation on how to use Dyspnea scale       Expected Outcomes  Short Term: Able to use Dyspnea scale daily in rehab to express subjective sense of shortness of breath during exertion;Long Term: Able to use Dyspnea scale to guide intensity level when exercising independently       Knowledge and understanding of Target Heart Rate Range (THRR)  Yes       Intervention  Provide education and explanation of THRR including how the numbers were predicted and where they are located for reference       Expected Outcomes  Short Term: Able to state/look up THRR       Able to check pulse independently  Yes       Intervention  Provide education and demonstration on how to check pulse in carotid and radial arteries.;Review the importance of being able to check your own pulse for safety during independent exercise       Expected Outcomes  Long Term: Able to check pulse independently and accurately;Short Term: Able to explain why pulse checking is important during independent exercise       Understanding of Exercise Prescription  Yes       Intervention  Provide education, explanation, and written materials on patient's individual exercise prescription       Expected Outcomes  Short Term: Able to explain program exercise  prescription;Long Term: Able to explain home exercise prescription to exercise independently          Exercise Goals Re-Evaluation : Exercise Goals Re-Evaluation    [place]  Name 07/08/17 1419 07/23/17 1234           Exercise Goal Re-Evaluation   Exercise Goals Review  Increase Physical Activity;Increase Strength and Stamina;Knowledge and understanding of Target Heart Rate Range (THRR)  Increase Physical Activity;Increase Strength and Stamina;Knowledge and understanding of Target Heart Rate Range (THRR)      Comments  Patient is doing well in CR and has increased his speed on the treadmill to 2.0. Patient has also maintained high SPMs on the nustep and his level. Patinet states that he has not felt much improvement yet although it is only his first 10 sessions. Patient has been to the cardiologist about his high blood pressure.   Patient is doing well in CR and has progressed both on the treadmill speed to 2.2 and the Nustep level to level three. Patient states that he feels better overall but is still weak and is concerned about his BP being too high. Patient will be progressed overtime and will be monitored throughout the program.       Expected Outcomes  Patient wishes to get better and to live a long life.   Patient wishes to get better and to live a long life.           Discharge Exercise Prescription (Final Exercise Prescription Changes): Exercise Prescription Changes - 07/23/17 1200      Response to Exercise   Blood Pressure (Admit)  152/72    Blood Pressure (Exercise)  170/84    Blood Pressure (Exit)  150/60    Heart Rate (Admit)  63 bpm    Heart Rate (Exercise)  121 bpm    Heart Rate (Exit)  80 bpm    Rating of Perceived Exertion (Exercise)  10    Duration  Progress to 30 minutes of  aerobic without signs/symptoms of physical distress    Intensity  THRR New [phone]      Progression   Progression  Continue to progress workloads to maintain intensity without signs/symptoms of physical distress.      Resistance Training   Training Prescription  Yes    Weight  3    Reps  10-15      Treadmill   MPH  2.2    Grade  0    Minutes  15    METs   2.7      NuStep   Level  3    SPM  169    Minutes  20    METs  2.1      Home Exercise Plan   Plans to continue exercise at  Home (comment)    Frequency  Add 2 additional days to program exercise sessions.    Initial Home Exercises Provided  06/17/17       Nutrition:  Target Goals: Understanding of nutrition guidelines, daily intake of sodium 1500mg , cholesterol 200mg , calories 30% from fat and 7% or less from saturated fats, daily to have 5 or more servings of fruits and vegetables.  Biometrics: Pre Biometrics - 06/11/17 0817      Pre Biometrics   Height  5' 9.5" (1.765 m)    Weight  190 lb 1.6 oz (86.2 kg)    Waist Circumference  40.5 inches    Hip Circumference  40 inches    Waist to Hip Ratio  1.01 %    BMI (Calculated)  27.68    Triceps Skinfold  12 mm    % Body Fat  26.8 %    Grip Strength  58.33 kg    Flexibility  0 in    Single Leg Stand  17 seconds        Nutrition Therapy Plan and Nutrition Goals: Nutrition Therapy & Goals - 07/27/17 1446      Nutrition Therapy   RD appointment deferred  Yes      Personal Nutrition Goals   Personal Goal #2  Patient has changed his diet. he now eats baked meats, low sodium, low fat choices.     Additional Goals?  No      Intervention Plan   Intervention  Nutrition handout(s) given to patient.    Expected Outcomes  Short Term Goal: Understand basic principles of dietary content, such as calories, fat, sodium, cholesterol and nutrients.;Long Term Goal: Adherence to prescribed nutrition plan.       Nutrition Assessments: Nutrition Assessments - 06/11/17 1406      MEDFICTS Scores   Pre Score  38       Nutrition Goals Re-Evaluation:   Nutrition Goals Discharge (Final Nutrition Goals Re-Evaluation):   Psychosocial: Target Goals: Acknowledge presence or absence of significant depression and/or stress, maximize coping skills, provide positive support system. Participant is able to verbalize types and ability to  use techniques and skills needed for reducing stress and depression.  Initial Review & Psychosocial Screening: Initial Psych Review & Screening - 06/11/17 1343      Initial Review   Current issues with  None Identified      Family Dynamics   Good Support System?  Yes      Barriers   Psychosocial barriers to participate in program  There are no identifiable barriers or psychosocial needs.      Screening Interventions   Interventions  Encouraged to exercise    Expected Outcomes  Short Term goal: Identification and review with participant of any Quality of Life or Depression concerns found by scoring the questionnaire.;Long Term goal: The participant improves quality of Life and PHQ9 Scores as seen by post scores and/or verbalization of changes       Quality of Life Scores: Quality of Life - 06/11/17 0816      Quality of Life Scores   Health/Function Pre  24.54 %    Socioeconomic Pre  29.38 %    Psych/Spiritual Pre  30 %    Family Pre  27.6 %    GLOBAL Pre  27.33 %      Scores of 19 and below usually indicate a poorer quality of life in these areas.  A difference of  2-3 points is a clinically meaningful difference.  A difference of 2-3 points in the total score of the Quality of Life Index has been associated with significant improvement in overall quality of life, self-image, physical symptoms, and general health in studies assessing change in quality of life.  PHQ-9: Recent Review Flowsheet Data    Depression screen [facility] 2/9 06/11/2017   Decreased Interest 0   Down, Depressed, Hopeless 0   PHQ - 2 Score 0   Altered sleeping 2   Tired, decreased energy 0   Change in appetite 0   Feeling bad or failure about yourself  0   Trouble concentrating 0   Moving slowly or fidgety/restless 0   Suicidal thoughts 0   PHQ-9 Score 2   Difficult  doing work/chores Somewhat difficult     Interpretation of Total Score  Total Score Depression Severity:  1-4 = Minimal depression, 5-9 =  Mild depression, 10-14 = Moderate depression, 15-19 = Moderately severe depression, 20-27 = Severe depression   Psychosocial Evaluation and Intervention: Psychosocial Evaluation - 06/11/17 1410      Psychosocial Evaluation & Interventions   Interventions  Encouraged to exercise with the program and follow exercise prescription    Continue Psychosocial Services   No Follow up required       Psychosocial Re-Evaluation: Psychosocial Re-Evaluation    [place] Name 07/08/17 1450 07/27/17 1448           Psychosocial Re-Evaluation   Current issues with  None Identified  None Identified      Comments  Patient initial QOL was 27.33 and his PHQ-9 score was 2.   -      Expected Outcomes  Patient will have no psychosocial issues identified at discharge.   Patient will have no psychosocial issues identified at discharge.       Interventions  Encouraged to attend Cardiac Rehabilitation for the exercise;Stress management education;Relaxation education  Encouraged to attend Cardiac Rehabilitation for the exercise;Stress management education;Relaxation education      Continue Psychosocial Services   No Follow up required  No Follow up required         Psychosocial Discharge (Final Psychosocial Re-Evaluation): Psychosocial Re-Evaluation - 07/27/17 1448      Psychosocial Re-Evaluation   Current issues with  None Identified    Expected Outcomes  Patient will have no psychosocial issues identified at discharge.     Interventions  Encouraged to attend Cardiac Rehabilitation for the exercise;Stress management education;Relaxation education    Continue Psychosocial Services   No Follow up required       Vocational Rehabilitation: Provide vocational rehab assistance to qualifying candidates.   Vocational Rehab Evaluation & Intervention: Vocational Rehab - 06/11/17 1359      Initial Vocational Rehab Evaluation & Intervention   Assessment shows need for Vocational Rehabilitation  No Still works  part-time       Education: Education Goals: Education classes will be provided on a weekly basis, covering required topics. Participant will state understanding/return demonstration of topics presented.  Learning Barriers/Preferences: Learning Barriers/Preferences - 06/11/17 1358      Learning Barriers/Preferences   Learning Barriers  None    Learning Preferences  Group Instruction;Individual Instruction;Pictoral;Skilled Demonstration;Verbal Instruction;Video;Written Material;Computer/Internet;Audio       Education Topics: Hypertension, Hypertension Reduction -Define heart disease and high blood pressure. Discus how high blood pressure affects the body and ways to reduce high blood pressure.   CARDIAC REHAB PHASE II EXERCISE from 07/22/2017 in [place]  Date  07/01/17  Educator  DC  Instruction Review Code  2- Demonstrated Understanding      Exercise and Your Heart -Discuss why it is important to exercise, the FITT principles of exercise, normal and abnormal responses to exercise, and how to exercise safely.   Angina -Discuss definition of angina, causes of angina, treatment of angina, and how to decrease risk of having angina.   CARDIAC REHAB PHASE II EXERCISE from 07/22/2017 in [place]  Date  07/15/17  Educator  DJ  Instruction Review Code  2- Demonstrated Understanding      Cardiac Medications -Review what the following cardiac medications are used for, how they affect the body, and side effects that may occur when taking the medications.  Medications include Aspirin, Beta  blockers, calcium channel blockers, ACE Inhibitors, angiotensin receptor blockers, diuretics, digoxin, and antihyperlipidemics.   CARDIAC REHAB PHASE II EXERCISE from 07/22/2017 in [place]  Date  07/22/17  Educator  DC  Instruction Review Code  2- Demonstrated Understanding      Congestive Heart Failure -Discuss the  definition of CHF, how to live with CHF, the signs and symptoms of CHF, and how keep track of weight and sodium intake.   Heart Disease and Intimacy -Discus the effect sexual activity has on the heart, how changes occur during intimacy as we age, and safety during sexual activity.   Smoking Cessation / COPD -Discuss different methods to quit smoking, the health benefits of quitting smoking, and the definition of COPD.   Nutrition I: Fats -Discuss the types of cholesterol, what cholesterol does to the heart, and how cholesterol levels can be controlled.   Nutrition II: Labels -Discuss the different components of food labels and how to read food label   Heart Parts/Heart Disease and PAD -Discuss the anatomy of the heart, the pathway of blood circulation through the heart, and these are affected by heart disease.   Stress I: Signs and Symptoms -Discuss the causes of stress, how stress may lead to anxiety and depression, and ways to limit stress.   Stress II: Relaxation -Discuss different types of relaxation techniques to limit stress.   CARDIAC REHAB PHASE II EXERCISE from 07/22/2017 in [place]  Date  06/17/17  Educator  DC  Instruction Review Code  2- Demonstrated Understanding      Warning Signs of Stroke / TIA -Discuss definition of a stroke, what the signs and symptoms are of a stroke, and how to identify when someone is having stroke.   CARDIAC REHAB PHASE II EXERCISE from 07/22/2017 in [place]  Date  06/24/17  Educator  DC  Instruction Review Code  2- Demonstrated Understanding      Knowledge Questionnaire Score: Knowledge Questionnaire Score - 06/11/17 1359      Knowledge Questionnaire Score   Pre Score  23/24       Core Components/Risk Factors/Patient Goals at Admission: Personal Goals and Risk Factors at Admission - 06/11/17 1406      Core Components/Risk Factors/Patient Goals on Admission    Weight  Management  Weight Maintenance    Personal Goal Other  Yes    Personal Goal  Get better, Live longer and healthier    Intervention  Attend CR 3xweek and supplement with home exercise 2 x week.     Expected Outcomes  Achieve personal goals       Core Components/Risk Factors/Patient Goals Review:  Goals and Risk Factor Review    Row Name 07/08/17 1447 07/27/17 1446           Core Components/Risk Factors/Patient Goals Review   Personal Goals Review  Weight Management/Obesity Get better; live longer and healthier.   Weight Management/Obesity Get better; live longer and healthier.       Review  Patient  has completed 10 sessions losing 2 lbs. He is doing well in the program with some progression. His has been hypertensive since he started. He say his cardiologist today 2/13 and h e added Lisinopril 5 mg daily. He says he is feeling somewhat stronger but has not really noticed a big difference yet. Will continue to monitor for progress.   Patient has completed 18 sessions losing 1 lb. He continues to do well in the program with  progression. He says he feels better but still feels weak. His cardiologist has adjusted his antihypertensive medications. His blood pressure readings have improved but continue to be hypertensive. Will continue to monitor.      Expected Outcomes  Patient will continue to attend sessions and complete the program meeting his personal goals.   Patient will continue to attend sessions and complete the program meeting his personal goals.          Core Components/Risk Factors/Patient Goals at Discharge (Final Review):  Goals and Risk Factor Review - 07/27/17 1446      Core Components/Risk Factors/Patient Goals Review   Personal Goals Review  Weight Management/Obesity Get better; live longer and healthier.     Review  Patient has completed 18 sessions losing 1 lb. He continues to do well in the program with progression. He says he feels better but still feels weak. His  cardiologist has adjusted his antihypertensive medications. His blood pressure readings have improved but continue to be hypertensive. Will continue to monitor.    Expected Outcomes  Patient will continue to attend sessions and complete the program meeting his personal goals.        ITP Comments: ITP Comments    Row Name 06/11/17 1400 06/11/17 1528         ITP Comments  Mr. Buchta is tarting our CR program on Monday 06/15/17 at 8:15 on Monday and Wednesday and at 3:45 on Friday. 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 It usually goes away after resting.  Patient is new to the program. He plans to start Monday 06/15/2017.         Comments: ITP 30 Day REVIEW Patient doing well in the program. Will continue to monitor for progress.

## 2017-07-27 NOTE — Progress Notes (Signed)
Daily Session Note  Patient Details  Name: Kyle Patel. MRN: [id number] Date of Birth: 1946/02/22 Referring Provider:     CARDIAC REHAB PHASE II ORIENTATION from 06/11/2017 in [place]  Referring Provider  Dr. Irish Lack      Encounter Date: 07/27/2017  Check In: Session Check In - 07/27/17 0815      Check-In   Location  [facility]    Staff Present  Diane Angelina Pih, MS, EP, [facility], Exercise Physiologist;Jaren Vanetten Wynetta Emery, RN, BSN;Gregory Cowan, BS, EP, Exercise Physiologist    Supervising physician immediately available to respond to emergencies  See telemetry face sheet for immediately available MD    Medication changes reported      No    Fall or balance concerns reported     No    Warm-up and Cool-down  Performed as group-led instruction    Resistance Training Performed  Yes    VAD Patient?  No      Pain Assessment   Currently in Pain?  No/denies    Pain Score  0-No pain    Multiple Pain Sites  No       Capillary Blood Glucose: No results found for this or any previous visit (from the past 24 hour(s)).    Social History   Tobacco Use  Smoking Status Former Smoker  . Packs/day: 0.10  . Years: 20.00  . Pack years: 2.00  . Types: Cigarettes  . Last attempt to quit: 04/25/2004  . Years since quitting: 13.2  Smokeless Tobacco Never Used    Goals Met:  Independence with exercise equipment Exercise tolerated well No report of cardiac concerns or symptoms Strength training completed today  Goals Unmet:  Not Applicable  Comments: Check out 915.   Dr. Kate Sable is Medical Director for [facility] Cardiac and Pulmonary Rehab.

## 2017-07-29 ENCOUNTER — Encounter (HOSPITAL_COMMUNITY)
Admission: RE | Admit: 2017-07-29 | Discharge: 2017-07-29 | Disposition: A | Discharge status: Home / Self Care | Payer: Medicare Other | Source: Ambulatory Visit | Attending: Cardiovascular Disease | Admitting: Cardiovascular Disease

## 2017-07-29 DIAGNOSIS — I214 Non-ST elevation (NSTEMI) myocardial infarction: Secondary | ICD-10-CM | POA: Diagnosis not present

## 2017-07-29 NOTE — Progress Notes (Signed)
Daily Session Note  Patient Details  Name: Kyle Patel. MRN: [id number] Date of Birth: [birth year] Referring Provider:     CARDIAC REHAB PHASE II ORIENTATION from 06/11/2017 in [place]  Referring Provider  Dr. Irish Lack      Encounter Date: 07/29/2017  Check In: Session Check In - 07/29/17 0842      Check-In   Location  [facility]    Staff Present  Russella Dar, MS, EP, [facility], Exercise Physiologist;Gregory Luther Parody, BS, EP, Exercise Physiologist    Supervising physician immediately available to respond to emergencies  See telemetry face sheet for immediately available MD    Medication changes reported      No    Fall or balance concerns reported     No    Tobacco Cessation  No Change    Resistance Training Performed  Yes    VAD Patient?  No      Pain Assessment   Currently in Pain?  No/denies    Pain Score  0-No pain    Multiple Pain Sites  No       Capillary Blood Glucose: No results found for this or any previous visit (from the past 24 hour(s)).    Social History   Tobacco Use  Smoking Status Former Smoker  . Packs/day: 0.10  . Years: 20.00  . Pack years: 2.00  . Types: Cigarettes  . Last attempt to quit: 04/25/2004  . Years since quitting: 13.2  Smokeless Tobacco Never Used    Goals Met:  Independence with exercise equipment Exercise tolerated well No report of cardiac concerns or symptoms Strength training completed today  Goals Unmet:  Not Applicable  Comments: Check out; 0915   Dr. Kate Sable is Medical Director for [place] and Pulmonary Rehab.

## 2017-07-31 ENCOUNTER — Encounter (HOSPITAL_COMMUNITY)
Admission: RE | Admit: 2017-07-31 | Discharge: 2017-07-31 | Disposition: A | Discharge status: Home / Self Care | Payer: Medicare Other | Source: Ambulatory Visit | Attending: Cardiovascular Disease | Admitting: Cardiovascular Disease

## 2017-07-31 DIAGNOSIS — I214 Non-ST elevation (NSTEMI) myocardial infarction: Secondary | ICD-10-CM

## 2017-07-31 DIAGNOSIS — Z9861 Coronary angioplasty status: Secondary | ICD-10-CM

## 2017-07-31 NOTE — Progress Notes (Signed)
Daily Session Note  Patient Details  Name: Kyle Patel. MRN: [id number] Date of Birth: July 11, 1945 Referring Provider:     CARDIAC REHAB PHASE II ORIENTATION from 06/11/2017 in [place]  Referring Provider  Dr. Irish Lack      Encounter Date: 07/31/2017  Check In: Session Check In - 07/31/17 0820      Check-In   Location  [facility]    Staff Present  Diane Angelina Pih, MS, EP, [facility], Exercise Physiologist;Danil Wedge Luther Parody, BS, EP, Exercise Physiologist;Debra Wynetta Emery, RN, BSN    Supervising physician immediately available to respond to emergencies  See telemetry face sheet for immediately available MD    Medication changes reported      No    Fall or balance concerns reported     No    Tobacco Cessation  No Change    Warm-up and Cool-down  Performed as group-led instruction    Resistance Training Performed  Yes    VAD Patient?  No      Pain Assessment   Currently in Pain?  No/denies    Pain Score  0-No pain    Multiple Pain Sites  No       Capillary Blood Glucose: No results found for this or any previous visit (from the past 24 hour(s)).    Social History   Tobacco Use  Smoking Status Former Smoker  . Packs/day: 0.10  . Years: 20.00  . Pack years: 2.00  . Types: Cigarettes  . Last attempt to quit: 04/25/2004  . Years since quitting: 13.2  Smokeless Tobacco Never Used    Goals Met:  Independence with exercise equipment Exercise tolerated well No report of cardiac concerns or symptoms Strength training completed today  Goals Unmet:  Not Applicable  Comments: Check out 915   Dr. Kate Sable is Medical Director for [place] and Pulmonary Rehab.

## 2017-08-03 ENCOUNTER — Encounter (HOSPITAL_COMMUNITY)
Admission: RE | Admit: 2017-08-03 | Discharge: 2017-08-03 | Disposition: A | Discharge status: Home / Self Care | Payer: Medicare Other | Source: Ambulatory Visit | Attending: Cardiovascular Disease | Admitting: Cardiovascular Disease

## 2017-08-03 DIAGNOSIS — I214 Non-ST elevation (NSTEMI) myocardial infarction: Secondary | ICD-10-CM | POA: Diagnosis not present

## 2017-08-03 DIAGNOSIS — Z9861 Coronary angioplasty status: Secondary | ICD-10-CM

## 2017-08-03 NOTE — Progress Notes (Signed)
Daily Session Note  Patient Details  Name: Kyle Patel. MRN: [id number] Date of Birth: Oct 22, 1945 Referring Provider:     CARDIAC REHAB PHASE II ORIENTATION from 06/11/2017 in [place]  Referring Provider  Dr. Irish Lack      Encounter Date: 08/03/2017  Check In: Session Check In - 08/03/17 0825      Check-In   Location  [facility]    Staff Present  Diane Angelina Pih, MS, EP, [facility], Exercise Physiologist;Judea Fennimore Luther Parody, BS, EP, Exercise Physiologist;Debra Wynetta Emery, RN, BSN    Supervising physician immediately available to respond to emergencies  See telemetry face sheet for immediately available MD    Medication changes reported      No    Fall or balance concerns reported     No    Tobacco Cessation  No Change    Warm-up and Cool-down  Performed as group-led instruction    Resistance Training Performed  Yes    VAD Patient?  No      Pain Assessment   Currently in Pain?  No/denies    Pain Score  0-No pain    Multiple Pain Sites  No       Capillary Blood Glucose: No results found for this or any previous visit (from the past 24 hour(s)).    Social History   Tobacco Use  Smoking Status Former Smoker  . Packs/day: 0.10  . Years: 20.00  . Pack years: 2.00  . Types: Cigarettes  . Last attempt to quit: 04/25/2004  . Years since quitting: 13.2  Smokeless Tobacco Never Used    Goals Met:  Independence with exercise equipment Exercise tolerated well No report of cardiac concerns or symptoms Strength training completed today  Goals Unmet:  Not Applicable  Comments: Check out 915   Dr. Kate Sable is Medical Director for [place] and Pulmonary Rehab.

## 2017-08-05 ENCOUNTER — Encounter (HOSPITAL_COMMUNITY)
Admission: RE | Admit: 2017-08-05 | Discharge: 2017-08-05 | Disposition: A | Discharge status: Home / Self Care | Payer: Medicare Other | Source: Ambulatory Visit | Attending: Cardiovascular Disease | Admitting: Cardiovascular Disease

## 2017-08-05 DIAGNOSIS — I214 Non-ST elevation (NSTEMI) myocardial infarction: Secondary | ICD-10-CM | POA: Diagnosis not present

## 2017-08-05 DIAGNOSIS — Z9861 Coronary angioplasty status: Secondary | ICD-10-CM

## 2017-08-05 NOTE — Progress Notes (Signed)
Daily Session Note  Patient Details  Name: Kyle H Difranco Jr. MRN: [id number] Date of Birth: 01/08/1946 Referring Provider:     CARDIAC REHAB PHASE II ORIENTATION from 06/11/2017 in [place] CARDIAC REHABILITATION  Referring Provider  Dr. Varanasi      Encounter Date: 08/05/2017  Check In: Session Check In - 08/05/17 0808      Check-In   Location  [facility]    Staff Present  Diane Coad, MS, EP, [facility], Exercise Physiologist;Nilah Belcourt, BS, EP, Exercise Physiologist;Debra Johnson, RN, BSN    Supervising physician immediately available to respond to emergencies  See telemetry face sheet for immediately available MD    Medication changes reported      No    Fall or balance concerns reported     No    Tobacco Cessation  No Change    Warm-up and Cool-down  Performed as group-led instruction    Resistance Training Performed  Yes    VAD Patient?  No      Pain Assessment   Currently in Pain?  No/denies    Pain Score  0-No pain    Multiple Pain Sites  No       Capillary Blood Glucose: No results found for this or any previous visit (from the past 24 hour(s)).    Social History   Tobacco Use  Smoking Status Former Smoker  . Packs/day: 0.10  . Years: 20.00  . Pack years: 2.00  . Types: Cigarettes  . Last attempt to quit: 04/25/2004  . Years since quitting: 13.2  Smokeless Tobacco Never Used    Goals Met:  Independence with exercise equipment Exercise tolerated well No report of cardiac concerns or symptoms Strength training completed today  Goals Unmet:  Not Applicable  Comments: Check out 915   Dr. Suresh Koneswaran is Medical Director for [place] Cardiac and Pulmonary Rehab. 

## 2017-08-07 ENCOUNTER — Encounter (HOSPITAL_COMMUNITY)
Admission: RE | Admit: 2017-08-07 | Discharge: 2017-08-07 | Disposition: A | Discharge status: Home / Self Care | Payer: Medicare Other | Source: Ambulatory Visit | Attending: Cardiovascular Disease | Admitting: Cardiovascular Disease

## 2017-08-07 DIAGNOSIS — Z9861 Coronary angioplasty status: Secondary | ICD-10-CM

## 2017-08-07 DIAGNOSIS — I214 Non-ST elevation (NSTEMI) myocardial infarction: Secondary | ICD-10-CM | POA: Diagnosis not present

## 2017-08-07 NOTE — Progress Notes (Signed)
Daily Session Note  Patient Details  Name: Kyle Patel. MRN: [id number] Date of Birth: 12/15/1945 Referring Provider:     CARDIAC REHAB PHASE II ORIENTATION from 06/11/2017 in [place]  Referring Provider  Dr. Irish Lack      Encounter Date: 08/07/2017  Check In: Session Check In - 08/07/17 0815      Check-In   Location  [facility]    Staff Present  Russella Dar, MS, EP, [facility], Exercise Physiologist;Yetunde Leis Wynetta Emery, RN, BSN    Supervising physician immediately available to respond to emergencies  See telemetry face sheet for immediately available MD    Medication changes reported      No    Fall or balance concerns reported     No    Warm-up and Cool-down  Performed as group-led instruction    Resistance Training Performed  Yes    VAD Patient?  No      Pain Assessment   Currently in Pain?  No/denies    Pain Score  0-No pain    Multiple Pain Sites  No       Capillary Blood Glucose: No results found for this or any previous visit (from the past 24 hour(s)).    Social History   Tobacco Use  Smoking Status Former Smoker  . Packs/day: 0.10  . Years: 20.00  . Pack years: 2.00  . Types: Cigarettes  . Last attempt to quit: 04/25/2004  . Years since quitting: 13.2  Smokeless Tobacco Never Used    Goals Met:  Proper associated with RPD/PD & O2 Sat Exercise tolerated well No report of cardiac concerns or symptoms Strength training completed today  Goals Unmet:  Not Applicable  Comments: Check out 915.   Dr. Kate Sable is Medical Director for [facility] Cardiac and Pulmonary Rehab.

## 2017-08-10 ENCOUNTER — Telehealth: Payer: Self-pay

## 2017-08-10 ENCOUNTER — Encounter (HOSPITAL_COMMUNITY)
Admission: RE | Admit: 2017-08-10 | Discharge: 2017-08-10 | Disposition: A | Discharge status: Home / Self Care | Payer: Medicare Other | Source: Ambulatory Visit | Attending: Cardiovascular Disease | Admitting: Cardiovascular Disease

## 2017-08-10 DIAGNOSIS — I214 Non-ST elevation (NSTEMI) myocardial infarction: Secondary | ICD-10-CM

## 2017-08-10 DIAGNOSIS — Z9861 Coronary angioplasty status: Secondary | ICD-10-CM

## 2017-08-10 MED ORDER — LISINOPRIL 10 MG PO TABS: 10.0000 mg | ORAL_TABLET | Freq: Every day | ORAL | 3 refills | Status: DC

## 2017-08-10 NOTE — Progress Notes (Signed)
Daily Session Note  Patient Details  Name: Kyle Patel. MRN: [id number] Date of Birth: March 27, 1946 Referring Provider:     CARDIAC REHAB PHASE II ORIENTATION from 06/11/2017 in [place]  Referring Provider  Dr. Irish Lack      Encounter Date: 08/10/2017  Check In: Session Check In - 08/10/17 0815      Check-In   Location  [facility]    Staff Present  Russella Dar, MS, EP, [facility], Exercise Physiologist;Dvon Jiles Wynetta Emery, RN, BSN    Supervising physician immediately available to respond to emergencies  See telemetry face sheet for immediately available MD    Medication changes reported      No    Fall or balance concerns reported     No    Warm-up and Cool-down  Performed as group-led instruction    Resistance Training Performed  Yes    VAD Patient?  No      Pain Assessment   Currently in Pain?  No/denies    Pain Score  0-No pain    Multiple Pain Sites  No       Capillary Blood Glucose: No results found for this or any previous visit (from the past 24 hour(s)).    Social History   Tobacco Use  Smoking Status Former Smoker  . Packs/day: 0.10  . Years: 20.00  . Pack years: 2.00  . Types: Cigarettes  . Last attempt to quit: 04/25/2004  . Years since quitting: 13.3  Smokeless Tobacco Never Used    Goals Met:  Independence with exercise equipment Exercise tolerated well No report of cardiac concerns or symptoms Strength training completed today  Goals Unmet:  Not Applicable  Comments: Check out 915.   Dr. Kate Sable is Medical Director for [facility] Cardiac and Pulmonary Rehab.

## 2017-08-10 NOTE — Telephone Encounter (Signed)
Called pt. Notified him to increase lisinopril to 10 mg daily. He voiced understanding. New rx sent to [facility].

## 2017-08-10 NOTE — Telephone Encounter (Signed)
-----   Message from Herminio Commons, MD sent at 08/07/2017  9:25 AM EDT ----- Regarding: RE: FYI  Please increase to 10 mg. Thank you, Debbie!   ----- Message ----- From: Dwana Melena, RN Sent: 08/07/2017   8:56 AM To: Herminio Commons, MD Subject: Audie Pinto Morning Dr. Raliegh Ip,   Mr. Priola is in cardiac rehab on session 23.  He saw you for the first time 07/08/17 and you added Lisinopril 5 mg daily for elevated B/p. His initial readings have improved but still average 145/80. He remains concerned and I assured him I would send you a note.  Thanks!  Jackelyn Poling

## 2017-08-12 ENCOUNTER — Encounter (HOSPITAL_COMMUNITY)
Admission: RE | Admit: 2017-08-12 | Discharge: 2017-08-12 | Disposition: A | Discharge status: Home / Self Care | Payer: Medicare Other | Source: Ambulatory Visit | Attending: Cardiovascular Disease | Admitting: Cardiovascular Disease

## 2017-08-12 DIAGNOSIS — I214 Non-ST elevation (NSTEMI) myocardial infarction: Secondary | ICD-10-CM | POA: Diagnosis not present

## 2017-08-12 DIAGNOSIS — Z9861 Coronary angioplasty status: Secondary | ICD-10-CM

## 2017-08-12 NOTE — Progress Notes (Addendum)
Daily Session Note  Patient Details  Name: Kyle Patel. MRN: [id number] Date of Birth: [birth year] Referring Provider:     CARDIAC REHAB PHASE II ORIENTATION from 06/11/2017 in [place]  Referring Provider  Dr. Irish Lack      Encounter Date: 08/12/2017  Check In: Session Check In - 08/12/17 0859      Check-In   Location  [facility]    Staff Present  Russella Dar, MS, EP, [facility], Exercise Physiologist;Debra Wynetta Emery, RN, BSN    Supervising physician immediately available to respond to emergencies  See telemetry face sheet for immediately available MD    Medication changes reported      No    Fall or balance concerns reported     No    Tobacco Cessation  No Change    Warm-up and Cool-down  Performed as group-led instruction    Resistance Training Performed  Yes    VAD Patient?  No      Pain Assessment   Currently in Pain?  No/denies    Pain Score  0-No pain    Multiple Pain Sites  No       Capillary Blood Glucose: No results found for this or any previous visit (from the past 24 hour(s)).    Social History   Tobacco Use  Smoking Status Former Smoker  . Packs/day: 0.10  . Years: 20.00  . Pack years: 2.00  . Types: Cigarettes  . Last attempt to quit: 04/25/2004  . Years since quitting: 13.3  Smokeless Tobacco Never Used    Goals Met:  Independence with exercise equipment Exercise tolerated well No report of cardiac concerns or symptoms Strength training completed today  Goals Unmet:  Not Applicable  Comments: Check out 915. Lisinopril increased to '10mg'$  daily    Dr. Kate Sable is Medical Director for [facility] Cardiac and Pulmonary Rehab.

## 2017-08-14 ENCOUNTER — Encounter (HOSPITAL_COMMUNITY)
Admission: RE | Admit: 2017-08-14 | Discharge: 2017-08-14 | Disposition: A | Discharge status: Home / Self Care | Payer: Medicare Other | Source: Ambulatory Visit | Attending: Cardiovascular Disease | Admitting: Cardiovascular Disease

## 2017-08-14 DIAGNOSIS — I214 Non-ST elevation (NSTEMI) myocardial infarction: Secondary | ICD-10-CM

## 2017-08-14 DIAGNOSIS — Z9861 Coronary angioplasty status: Secondary | ICD-10-CM

## 2017-08-14 NOTE — Progress Notes (Signed)
Daily Session Note  Patient Details  Name: Armando Bukhari. MRN: [id number] Date of Birth: Mar 30, 1946 Referring Provider:     CARDIAC REHAB PHASE II ORIENTATION from 06/11/2017 in [place]  Referring Provider  Dr. Irish Lack      Encounter Date: 08/14/2017  Check In: Session Check In - 08/14/17 0815      Check-In   Location  [facility]    Staff Present  Russella Dar, MS, EP, [facility], Exercise Physiologist;Kenasia Scheller Wynetta Emery, RN, BSN    Supervising physician immediately available to respond to emergencies  See telemetry face sheet for immediately available MD    Medication changes reported      No    Fall or balance concerns reported     No    Warm-up and Cool-down  Performed as group-led instruction    Resistance Training Performed  Yes    VAD Patient?  No      Pain Assessment   Currently in Pain?  No/denies    Pain Score  0-No pain    Multiple Pain Sites  No       Capillary Blood Glucose: No results found for this or any previous visit (from the past 24 hour(s)).    Social History   Tobacco Use  Smoking Status Former Smoker  . Packs/day: 0.10  . Years: 20.00  . Pack years: 2.00  . Types: Cigarettes  . Last attempt to quit: 04/25/2004  . Years since quitting: 13.3  Smokeless Tobacco Never Used    Goals Met:  Independence with exercise equipment Exercise tolerated well No report of cardiac concerns or symptoms Strength training completed today  Goals Unmet:  Not Applicable  Comments: Check out 915.   Dr. Kate Sable is Medical Director for [facility] Cardiac and Pulmonary Rehab.

## 2017-08-17 ENCOUNTER — Encounter (HOSPITAL_COMMUNITY)
Admission: RE | Admit: 2017-08-17 | Discharge: 2017-08-17 | Disposition: A | Discharge status: Home / Self Care | Payer: Medicare Other | Source: Ambulatory Visit | Attending: Cardiovascular Disease | Admitting: Cardiovascular Disease

## 2017-08-17 DIAGNOSIS — I214 Non-ST elevation (NSTEMI) myocardial infarction: Secondary | ICD-10-CM

## 2017-08-17 DIAGNOSIS — Z9861 Coronary angioplasty status: Secondary | ICD-10-CM

## 2017-08-17 NOTE — Progress Notes (Signed)
Daily Session Note  Patient Details  Name: Kyle Patel. MRN: [id number] Date of Birth: August 20, 1945 Referring Provider:     CARDIAC REHAB PHASE II ORIENTATION from 06/11/2017 in [place]  Referring Provider  Dr. Irish Lack      Encounter Date: 08/17/2017  Check In: Session Check In - 08/17/17 0815      Check-In   Location  [facility]    Staff Present  Russella Dar, MS, EP, [facility], Exercise Physiologist;Kevork Joyce Wynetta Emery, RN, BSN    Supervising physician immediately available to respond to emergencies  See telemetry face sheet for immediately available MD    Medication changes reported      No    Fall or balance concerns reported     No    Warm-up and Cool-down  Performed as group-led instruction    Resistance Training Performed  Yes    VAD Patient?  No      Pain Assessment   Currently in Pain?  No/denies    Pain Score  0-No pain    Multiple Pain Sites  No       Capillary Blood Glucose: No results found for this or any previous visit (from the past 24 hour(s)).    Social History   Tobacco Use  Smoking Status Former Smoker  . Packs/day: 0.10  . Years: 20.00  . Pack years: 2.00  . Types: Cigarettes  . Last attempt to quit: 04/25/2004  . Years since quitting: 13.3  Smokeless Tobacco Never Used    Goals Met:  Independence with exercise equipment Exercise tolerated well No report of cardiac concerns or symptoms Strength training completed today  Goals Unmet:  Not Applicable  Comments: Check out 1030.   Dr. Kate Sable is Medical Director for [facility] Cardiac and Pulmonary Rehab.

## 2017-08-19 ENCOUNTER — Encounter (HOSPITAL_COMMUNITY)
Admission: RE | Admit: 2017-08-19 | Discharge: 2017-08-19 | Disposition: A | Discharge status: Home / Self Care | Payer: Medicare Other | Source: Ambulatory Visit | Attending: Cardiovascular Disease | Admitting: Cardiovascular Disease

## 2017-08-19 DIAGNOSIS — I214 Non-ST elevation (NSTEMI) myocardial infarction: Secondary | ICD-10-CM

## 2017-08-19 DIAGNOSIS — Z9861 Coronary angioplasty status: Secondary | ICD-10-CM

## 2017-08-19 NOTE — Progress Notes (Signed)
Daily Session Note  Patient Details  Name: Kyle Patel. MRN: [id number] Date of Birth: 11/02/1945 Referring Provider:     CARDIAC REHAB PHASE II ORIENTATION from 06/11/2017 in [place]  Referring Provider  Dr. Irish Lack      Encounter Date: 08/19/2017  Check In: Session Check In - 08/19/17 0815      Check-In   Location  [facility]    Staff Present  Aundra Dubin, RN, BSN;Gregory Luther Parody, BS, EP, Exercise Physiologist    Supervising physician immediately available to respond to emergencies  See telemetry face sheet for immediately available MD    Medication changes reported      No    Fall or balance concerns reported     No    Warm-up and Cool-down  Performed as group-led instruction    Resistance Training Performed  Yes    VAD Patient?  No      Pain Assessment   Currently in Pain?  No/denies    Pain Score  0-No pain    Multiple Pain Sites  No       Capillary Blood Glucose: No results found for this or any previous visit (from the past 24 hour(s)).  Exercise Prescription Changes - 08/18/17 1200      Response to Exercise   Blood Pressure (Admit)  150/70    Blood Pressure (Exercise)  172/82    Blood Pressure (Exit)  130/78    Heart Rate (Admit)  62 bpm    Heart Rate (Exercise)  115 bpm    Heart Rate (Exit)  71 bpm    Rating of Perceived Exertion (Exercise)  10    Duration  Progress to 30 minutes of  aerobic without signs/symptoms of physical distress    Intensity  THRR New [phone]      Progression   Progression  Continue to progress workloads to maintain intensity without signs/symptoms of physical distress.      Resistance Training   Training Prescription  Yes    Weight  4    Reps  10-15      Treadmill   MPH  2.6    Grade  0    Minutes  15    METs  2.9      NuStep   Level  3    SPM  158    Minutes  20    METs  2      Home Exercise Plan   Plans to continue exercise at  Home (comment)    Frequency  Add 2  additional days to program exercise sessions.    Initial Home Exercises Provided  06/17/17       Social History   Tobacco Use  Smoking Status Former Smoker  . Packs/day: 0.10  . Years: 20.00  . Pack years: 2.00  . Types: Cigarettes  . Last attempt to quit: 04/25/2004  . Years since quitting: 13.3  Smokeless Tobacco Never Used    Goals Met:  Independence with exercise equipment Exercise tolerated well No report of cardiac concerns or symptoms Strength training completed today  Goals Unmet:  Not Applicable  Comments: Check out 1030.   Dr. Kate Sable is Medical Director for [facility] Cardiac and Pulmonary Rehab.

## 2017-08-21 ENCOUNTER — Encounter (HOSPITAL_COMMUNITY)
Admission: RE | Admit: 2017-08-21 | Discharge: 2017-08-21 | Disposition: A | Discharge status: Home / Self Care | Payer: Medicare Other | Source: Ambulatory Visit | Attending: Cardiovascular Disease | Admitting: Cardiovascular Disease

## 2017-08-21 DIAGNOSIS — I214 Non-ST elevation (NSTEMI) myocardial infarction: Secondary | ICD-10-CM | POA: Diagnosis not present

## 2017-08-21 DIAGNOSIS — Z9861 Coronary angioplasty status: Secondary | ICD-10-CM

## 2017-08-21 NOTE — Progress Notes (Signed)
Daily Session Note  Patient Details  Name: Kyle Patel. MRN: [id number] Date of Birth: 07-16-1945 Referring Provider:     CARDIAC REHAB PHASE II ORIENTATION from 06/11/2017 in [place]  Referring Provider  Dr. Irish Lack      Encounter Date: 08/21/2017  Check In: Session Check In - 08/21/17 1015      Check-In   Location  [facility]    Staff Present  Aundra Dubin, RN, BSN;Sitara Cashwell Luther Parody, BS, EP, Exercise Physiologist;Diane Coad, MS, EP, [facility], Exercise Physiologist    Supervising physician immediately available to respond to emergencies  See telemetry face sheet for immediately available MD    Medication changes reported      No    Fall or balance concerns reported     No    Tobacco Cessation  No Change    Warm-up and Cool-down  Performed as group-led instruction    Resistance Training Performed  Yes    VAD Patient?  No      Pain Assessment   Currently in Pain?  No/denies    Pain Score  0-No pain    Multiple Pain Sites  No       Capillary Blood Glucose: No results found for this or any previous visit (from the past 24 hour(s)).    Social History   Tobacco Use  Smoking Status Former Smoker  . Packs/day: 0.10  . Years: 20.00  . Pack years: 2.00  . Types: Cigarettes  . Last attempt to quit: 04/25/2004  . Years since quitting: 13.3  Smokeless Tobacco Never Used    Goals Met:  Independence with exercise equipment Exercise tolerated well No report of cardiac concerns or symptoms Strength training completed today  Goals Unmet:  Not Applicable  Comments: Check out 915   Dr. Kate Sable is Medical Director for [place] and Pulmonary Rehab.

## 2017-08-24 ENCOUNTER — Encounter (HOSPITAL_COMMUNITY)
Admission: RE | Admit: 2017-08-24 | Discharge: 2017-08-24 | Disposition: A | Discharge status: Home / Self Care | Payer: Medicare Other | Source: Ambulatory Visit | Attending: Cardiovascular Disease | Admitting: Cardiovascular Disease

## 2017-08-24 DIAGNOSIS — I214 Non-ST elevation (NSTEMI) myocardial infarction: Secondary | ICD-10-CM | POA: Diagnosis present

## 2017-08-24 DIAGNOSIS — Z955 Presence of coronary angioplasty implant and graft: Secondary | ICD-10-CM | POA: Insufficient documentation

## 2017-08-24 DIAGNOSIS — Z9861 Coronary angioplasty status: Secondary | ICD-10-CM

## 2017-08-24 NOTE — Progress Notes (Signed)
Cardiac Individual Treatment Plan  Patient Details  Name: Kyle Patel. MRN: [id number] Date of Birth: [birth year] Referring Provider:     CARDIAC REHAB PHASE II ORIENTATION from 06/11/2017 in [place]  Referring Provider  Dr. Irish Lack      Initial Encounter Date:    CARDIAC REHAB PHASE II ORIENTATION from 06/11/2017 in [place]  Date  06/11/17  Referring Provider  Dr. Irish Lack      Visit Diagnosis: NSTEMI (non-ST elevated myocardial infarction) ([place])  S/P PTCA (percutaneous transluminal coronary angioplasty)  Patient's Home Medications on Admission:  Current Outpatient Medications:  .  aspirin 81 MG EC tablet, Take 81 mg by mouth daily., Disp: , Rfl:  .  cholecalciferol (VITAMIN D) 1000 UNITS tablet, Take 1,000 Units by mouth daily., Disp: , Rfl:  .  Coenzyme Q10-Vitamin E (QUNOL ULTRA COQ10) 100-150 MG-UNIT CAPS, Take 1 capsule by mouth at bedtime., Disp: , Rfl:  .  lisinopril (PRINIVIL,ZESTRIL) 10 MG tablet, Take 1 tablet (10 mg total) by mouth daily., Disp: 90 tablet, Rfl: 3 .  LYRICA 75 MG capsule, Take 75 mg by mouth every evening., Disp: , Rfl:  .  metoprolol succinate (TOPROL-XL) 25 MG 24 hr tablet, Take 1 tablet (25 mg total) by mouth daily., Disp: 90 tablet, Rfl: 1 .  Misc Natural Products (OSTEO BI-FLEX ADV JOINT SHIELD) TABS, Take 1 tablet by mouth daily., Disp: , Rfl:  .  Multiple Vitamins-Minerals (CENTRUM PO), Take 1 tablet by mouth every morning., Disp: , Rfl:  .  nitroGLYCERIN (NITROSTAT) 0.4 MG SL tablet, Place 1 tablet (0.4 mg total) under the tongue every 5 (five) minutes x 3 doses as needed for chest pain., Disp: 25 tablet, Rfl: 2 .  rosuvastatin (CRESTOR) 20 MG tablet, Take 1 tablet (20 mg total) by mouth daily., Disp: 90 tablet, Rfl: 3 .  ticagrelor (BRILINTA) 90 MG TABS tablet, Take 1 tablet (90 mg total) by mouth 2 (two) times daily., Disp: 180 tablet, Rfl: 2 .  trolamine salicylate (ASPERCREME) 10 % cream,  Apply 1 application topically at bedtime as needed (For arthritis pain in knee)., Disp: , Rfl:   Past Medical History: Past Medical History:  Diagnosis Date  . Arthritis   . 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 COPD (chronic obstructive pulmonary disease) ([place])   . 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 Hypercholesteremia   . Hypertension   . Pulmonary embolism ([place]) 10/2011    Tobacco Use: Social History   Tobacco Use  Smoking Status Former Smoker  . Packs/day: 0.10  . Years: 20.00  . Pack years: 2.00  . Types: Cigarettes  . Last attempt to quit: 04/25/2004  . Years since quitting: 13.3  Smokeless Tobacco Never Used    Labs: Recent Chemical engineer    Labs for ITP Cardiac and Pulmonary Rehab Latest Ref Rng & Units 04/24/2017 04/25/2017 07/21/2017   Cholestrol 0 - 200 mg/dL - 130 147   LDLCALC 0 - 99 mg/dL - 72 81   HDL >40 mg/dL - 49 49   Trlycerides <150 mg/dL - 47 83   Hemoglobin A1c 4.8 - 5.6 % 6.4(H) - -      Capillary Blood Glucose: Lab Results  Component Value Date   GLUCAP 114 (H) 11/18/2011   GLUCAP 124 (H) 11/16/2011   GLUCAP 102 (H) 11/10/2011     Exercise Target Goals:    Exercise Program Goal: Individual exercise prescription set  using results from initial 6 min walk test and THRR while considering  patient's activity barriers and safety.   Exercise Prescription Goal: Starting with aerobic activity 30 plus minutes a day, 3 days per week for initial exercise prescription. Provide home exercise prescription and guidelines that participant acknowledges understanding prior to discharge.  Activity Barriers & Risk Stratification: Activity Barriers & Cardiac Risk Stratification - 06/11/17 0818      Activity Barriers & Cardiac Risk Stratification   Cardiac Risk Stratification  High       6 Minute Walk: 6 Minute Walk    Row Name 06/11/17 0932         6 Minute Walk   Phase  Initial     Distance  1250 feet      Distance % Change  0 %     Distance Feet Change  0 ft     Walk Time  6 minutes     # of Rest Breaks  0     MPH  2.36     METS  2.81     RPE  11     Perceived Dyspnea   11     VO2 Peak  11.51     Symptoms  No     Resting HR  93 bpm     Resting BP  144/84     Resting Oxygen Saturation   95 %     Exercise Oxygen Saturation  during 6 min walk  93 %     Max Ex. HR  121 bpm     Max Ex. BP  152/84     2 Minute Post BP  136/80        Oxygen Initial Assessment: Oxygen Initial Assessment - 06/11/17 1403      Home Oxygen   Home Oxygen Device  None    Sleep Oxygen Prescription  None    Home Exercise Oxygen Prescription  None    Home at Rest Exercise Oxygen Prescription  None      Initial 6 min Walk   Oxygen Used  None       Oxygen Re-Evaluation:   Oxygen Discharge (Final Oxygen Re-Evaluation):   Initial Exercise Prescription: Initial Exercise Prescription - 06/11/17 0800      Date of Initial Exercise RX and Referring Provider   Date  06/11/17    Referring Provider  Dr. Irish Lack      Treadmill   MPH  1.5    Grade  0    Minutes  15    METs  2      NuStep   Level  2    SPM  96    Minutes  20    METs  2.1      Prescription Details   Frequency (times per week)  3    Duration  Progress to 30 minutes of continuous aerobic without signs/symptoms of physical distress      Intensity   THRR 40-80% of Max Heartrate  [phone]    Ratings of Perceived Exertion  11-13    Perceived Dyspnea  0-4      Progression   Progression  Continue progressive overload as per policy without signs/symptoms or physical distress.      Resistance Training   Training Prescription  Yes    Weight  1    Reps  10-15       Perform Capillary Blood Glucose checks as needed.  Exercise Prescription Changes:  Exercise Prescription Changes  Row Name 06/17/17 0800 06/23/17 0700 07/08/17 1400 07/23/17 1200 08/11/17 0800     Response to Exercise   Blood Pressure (Admit)  -  150/82   152/80  152/72  160/68   Blood Pressure (Exercise)  -  160/82  162/82  170/84  190/78   Blood Pressure (Exit)  -  142/80  136/86  150/60  140/78   Heart Rate (Admit)  -  68 bpm  67 bpm  63 bpm  65 bpm   Heart Rate (Exercise)  -  104 bpm  113 bpm  121 bpm  107 bpm   Heart Rate (Exit)  -  76 bpm  76 bpm  80 bpm  75 bpm   Rating of Perceived Exertion (Exercise)  -  11  10  10  10    Duration  -  Progress to 30 minutes of  aerobic without signs/symptoms of physical distress  Progress to 30 minutes of  aerobic without signs/symptoms of physical distress  Progress to 30 minutes of  aerobic without signs/symptoms of physical distress  Progress to 30 minutes of  aerobic without signs/symptoms of physical distress   Intensity  -  THRR New [phone]  THRR New 101-117-134  THRR New [phone]  THRR New [phone]     Progression   Progression  -  Continue to progress workloads to maintain intensity without signs/symptoms of physical distress.  Continue to progress workloads to maintain intensity without signs/symptoms of physical distress.  Continue to progress workloads to maintain intensity without signs/symptoms of physical distress.  Continue to progress workloads to maintain intensity without signs/symptoms of physical distress.     Resistance Training   Training Prescription  Yes  Yes  Yes  Yes  Yes   Weight  1  1  2  3  4    Reps  10-15  10-15  10-15  10-15  10-15     Treadmill   MPH  1.5  1.5  2  2.2  2.4   Grade  0  0  0  0  0   Minutes  15  15  15  15  15    METs  2  2.1  2.5  2.7  2.8     NuStep   Level  2  2  2  3  3    SPM  96  138  119  169  156   Minutes  20  20  20  20  20    METs  2.1  2.1  2  2.1  2     Home Exercise Plan   Plans to continue exercise at  Home (comment)  Home (comment)  Home (comment)  Home (comment)  Home (comment)   Frequency  Add 2 additional days to program exercise sessions.  Add 2 additional days to program exercise sessions.  Add 2 additional days to program  exercise sessions.  Add 2 additional days to program exercise sessions.  Add 2 additional days to program exercise sessions.   Initial Home Exercises Provided  06/17/17  06/17/17  06/17/17  06/17/17  06/17/17   Row Name 08/18/17 1200             Response to Exercise   Blood Pressure (Admit)  150/70       Blood Pressure (Exercise)  172/82       Blood Pressure (Exit)  130/78       Heart Rate (Admit)  62 bpm  Heart Rate (Exercise)  115 bpm       Heart Rate (Exit)  71 bpm       Rating of Perceived Exertion (Exercise)  10       Duration  Progress to 30 minutes of  aerobic without signs/symptoms of physical distress       Intensity  THRR New [phone]         Progression   Progression  Continue to progress workloads to maintain intensity without signs/symptoms of physical distress.         Resistance Training   Training Prescription  Yes       Weight  4       Reps  10-15         Treadmill   MPH  2.6       Grade  0       Minutes  15       METs  2.9         NuStep   Level  3       SPM  158       Minutes  20       METs  2         Home Exercise Plan   Plans to continue exercise at  Home (comment)       Frequency  Add 2 additional days to program exercise sessions.       Initial Home Exercises Provided  06/17/17          Exercise Comments:  Exercise Comments    Row Name 06/17/17 2505 06/23/17 0800 07/08/17 1421 07/23/17 1235 08/11/17 0824   Exercise Comments  Patient recieved the take hom eexercise plan today. THR was addressed as were safety guidelines for exercising when not in CR. Patient demonstrated an understanding and was encouraged to ask any future questions as they arise.   Patient is doing well in CR. He has increased his SPMs on the Nustep machine substantially. He is still new to teh program and will be progressed more in time.   Patient is doing well in CR and has increased his speed on the treadmill to 2.0. Patient has also maintained high SPMs on the  nustep and his level. [place] states that he has not felt much improvement yet although it is only his first 10 sessions. Patient has been to the cardiologist about his high blood pressure.   Patient is doing well in CR and has progressed both on the treadmill speed to 2.2 and the Nustep level to level three. Patient states that he feels better overall but is still weak and is concerned about his BP being too high. Patient will be progressed overtime and will be monitored throughout the program.  Patient is doing well in CR. Patient has had BP scares the last few weeks but the physician is aware and is changing his medication. Patient has increased his speed on the treadmill to 2.4 and has maintained his level on the nustep and is remaining strong during sessions.    [place] Name 08/18/17 1301           Exercise Comments  Patient is doing well in CR and has increased his speed on the treadmill. Patient has also increased SPMs on the Nustep machine while maintaining his level. Patient's BP will continued to be monitored throughout the remainder of the program.           Exercise Goals and Review:  Exercise Goals  [place] Name 06/11/17 2979             Exercise Goals   Increase Physical Activity  Yes       Intervention  Provide advice, education, support and counseling about physical activity/exercise needs.;Develop an individualized exercise prescription for aerobic and resistive training based on initial evaluation findings, risk stratification, comorbidities and participant's personal goals.       Expected Outcomes  Short Term: Attend rehab on a regular basis to increase amount of physical activity.       Increase Strength and Stamina  Yes       Intervention  Provide advice, education, support and counseling about physical activity/exercise needs.;Develop an individualized exercise prescription for aerobic and resistive training based on initial evaluation findings, risk stratification,  comorbidities and participant's personal goals.       Expected Outcomes  Short Term: Increase workloads from initial exercise prescription for resistance, speed, and METs.       Able to understand and use rate of perceived exertion (RPE) scale  Yes       Intervention  Provide education and explanation on how to use RPE scale       Expected Outcomes  Short Term: Able to use RPE daily in rehab to express subjective intensity level;Long Term:  Able to use RPE to guide intensity level when exercising independently       Able to understand and use Dyspnea scale  Yes       Intervention  Provide education and explanation on how to use Dyspnea scale       Expected Outcomes  Short Term: Able to use Dyspnea scale daily in rehab to express subjective sense of shortness of breath during exertion;Long Term: Able to use Dyspnea scale to guide intensity level when exercising independently       Knowledge and understanding of Target Heart Rate Range (THRR)  Yes       Intervention  Provide education and explanation of THRR including how the numbers were predicted and where they are located for reference       Expected Outcomes  Short Term: Able to state/look up THRR       Able to check pulse independently  Yes       Intervention  Provide education and demonstration on how to check pulse in carotid and radial arteries.;Review the importance of being able to check your own pulse for safety during independent exercise       Expected Outcomes  Long Term: Able to check pulse independently and accurately;Short Term: Able to explain why pulse checking is important during independent exercise       Understanding of Exercise Prescription  Yes       Intervention  Provide education, explanation, and written materials on patient's individual exercise prescription       Expected Outcomes  Short Term: Able to explain program exercise prescription;Long Term: Able to explain home exercise prescription to exercise independently           Exercise Goals Re-Evaluation : Exercise Goals Re-Evaluation    [place] Name 07/08/17 1419 07/23/17 1234 08/18/17 1300         Exercise Goal Re-Evaluation   Exercise Goals Review  Increase Physical Activity;Increase Strength and Stamina;Knowledge and understanding of Target Heart Rate Range (THRR)  Increase Physical Activity;Increase Strength and Stamina;Knowledge and understanding of Target Heart Rate Range (THRR)  Increase Physical Activity;Increase Strength and Stamina;Knowledge and understanding of Target Heart Rate Range (THRR)  Comments  Patient is doing well in CR and has increased his speed on the treadmill to 2.0. Patient has also maintained high SPMs on the nustep and his level. Patinet states that he has not felt much improvement yet although it is only his first 10 sessions. Patient has been to the cardiologist about his high blood pressure.   Patient is doing well in CR and has progressed both on the treadmill speed to 2.2 and the Nustep level to level three. Patient states that he feels better overall but is still weak and is concerned about his BP being too high. Patient will be progressed overtime and will be monitored throughout the program.   Patient is doing well in CR and has increased his speed on the treadmill. Patient has also increased SPMs on the Nustep machine while maintaining his level. Patient's BP will continued to be monitored throughout the remainder of the program.      Expected Outcomes  Patient wishes to get better and to live a long life.   Patient wishes to get better and to live a long life.   Patient wishes to get better and to live a long life.          Discharge Exercise Prescription (Final Exercise Prescription Changes): Exercise Prescription Changes - 08/18/17 1200      Response to Exercise   Blood Pressure (Admit)  150/70    Blood Pressure (Exercise)  172/82    Blood Pressure (Exit)  130/78    Heart Rate (Admit)  62 bpm    Heart Rate (Exercise)   115 bpm    Heart Rate (Exit)  71 bpm    Rating of Perceived Exertion (Exercise)  10    Duration  Progress to 30 minutes of  aerobic without signs/symptoms of physical distress    Intensity  THRR New [phone]      Progression   Progression  Continue to progress workloads to maintain intensity without signs/symptoms of physical distress.      Resistance Training   Training Prescription  Yes    Weight  4    Reps  10-15      Treadmill   MPH  2.6    Grade  0    Minutes  15    METs  2.9      NuStep   Level  3    SPM  158    Minutes  20    METs  2      Home Exercise Plan   Plans to continue exercise at  Home (comment)    Frequency  Add 2 additional days to program exercise sessions.    Initial Home Exercises Provided  06/17/17       Nutrition:  Target Goals: Understanding of nutrition guidelines, daily intake of sodium 1500mg , cholesterol 200mg , calories 30% from fat and 7% or less from saturated fats, daily to have 5 or more servings of fruits and vegetables.  Biometrics: Pre Biometrics - 06/11/17 0817      Pre Biometrics   Height  5' 9.5" (1.765 m)    Weight  190 lb 1.6 oz (86.2 kg)    Waist Circumference  40.5 inches    Hip Circumference  40 inches    Waist to Hip Ratio  1.01 %    BMI (Calculated)  27.68    Triceps Skinfold  12 mm    % Body Fat  26.8 %    Grip Strength  58.33 kg  Flexibility  0 in    Single Leg Stand  17 seconds        Nutrition Therapy Plan and Nutrition Goals: Nutrition Therapy & Goals - 08/24/17 1457      Nutrition Therapy   RD appointment deferred  Yes      Personal Nutrition Goals   Personal Goal #2  Patient continues to eat baked meats, low sodium, low fat choices.     Additional Goals?  No      Intervention Plan   Intervention  Nutrition handout(s) given to patient.    Expected Outcomes  Short Term Goal: Understand basic principles of dietary content, such as calories, fat, sodium, cholesterol and nutrients.;Long Term Goal:  Adherence to prescribed nutrition plan.       Nutrition Assessments: Nutrition Assessments - 06/11/17 1406      MEDFICTS Scores   Pre Score  38       Nutrition Goals Re-Evaluation:   Nutrition Goals Discharge (Final Nutrition Goals Re-Evaluation):   Psychosocial: Target Goals: Acknowledge presence or absence of significant depression and/or stress, maximize coping skills, provide positive support system. Participant is able to verbalize types and ability to use techniques and skills needed for reducing stress and depression.  Initial Review & Psychosocial Screening: Initial Psych Review & Screening - 06/11/17 1343      Initial Review   Current issues with  None Identified      Family Dynamics   Good Support System?  Yes      Barriers   Psychosocial barriers to participate in program  There are no identifiable barriers or psychosocial needs.      Screening Interventions   Interventions  Encouraged to exercise    Expected Outcomes  Short Term goal: Identification and review with participant of any Quality of Life or Depression concerns found by scoring the questionnaire.;Long Term goal: The participant improves quality of Life and PHQ9 Scores as seen by post scores and/or verbalization of changes       Quality of Life Scores: Quality of Life - 06/11/17 0816      Quality of Life Scores   Health/Function Pre  24.54 %    Socioeconomic Pre  29.38 %    Psych/Spiritual Pre  30 %    Family Pre  27.6 %    GLOBAL Pre  27.33 %      Scores of 19 and below usually indicate a poorer quality of life in these areas.  A difference of  2-3 points is a clinically meaningful difference.  A difference of 2-3 points in the total score of the Quality of Life Index has been associated with significant improvement in overall quality of life, self-image, physical symptoms, and general health in studies assessing change in quality of life.  PHQ-9: Recent Review Flowsheet Data    Depression  screen [facility] 2/9 06/11/2017   Decreased Interest 0   Down, Depressed, Hopeless 0   PHQ - 2 Score 0   Altered sleeping 2   Tired, decreased energy 0   Change in appetite 0   Feeling bad or failure about yourself  0   Trouble concentrating 0   Moving slowly or fidgety/restless 0   Suicidal thoughts 0   PHQ-9 Score 2   Difficult doing work/chores Somewhat difficult     Interpretation of Total Score  Total Score Depression Severity:  1-4 = Minimal depression, 5-9 = Mild depression, 10-14 = Moderate depression, 15-19 = Moderately severe depression, 20-27 = Severe depression  Psychosocial Evaluation and Intervention: Psychosocial Evaluation - 06/11/17 1410      Psychosocial Evaluation & Interventions   Interventions  Encouraged to exercise with the program and follow exercise prescription    Continue Psychosocial Services   No Follow up required       Psychosocial Re-Evaluation: Psychosocial Re-Evaluation    [place] Name 07/08/17 1450 07/27/17 1448 08/24/17 1500         Psychosocial Re-Evaluation   Current issues with  None Identified  None Identified  None Identified     Comments  Patient initial QOL was 27.33 and his PHQ-9 score was 2.   -  Patient initial QOL was 27.33 and his PHQ-9 score was 2.      Expected Outcomes  Patient will have no psychosocial issues identified at discharge.   Patient will have no psychosocial issues identified at discharge.   Patient will have no psychosocial issues identified at discharge.      Interventions  Encouraged to attend Cardiac Rehabilitation for the exercise;Stress management education;Relaxation education  Encouraged to attend Cardiac Rehabilitation for the exercise;Stress management education;Relaxation education  Encouraged to attend Cardiac Rehabilitation for the exercise;Stress management education;Relaxation education     Continue Psychosocial Services   No Follow up required  No Follow up required  No Follow up required         Psychosocial Discharge (Final Psychosocial Re-Evaluation): Psychosocial Re-Evaluation - 08/24/17 1500      Psychosocial Re-Evaluation   Current issues with  None Identified    Comments  Patient initial QOL was 27.33 and his PHQ-9 score was 2.     Expected Outcomes  Patient will have no psychosocial issues identified at discharge.     Interventions  Encouraged to attend Cardiac Rehabilitation for the exercise;Stress management education;Relaxation education    Continue Psychosocial Services   No Follow up required       Vocational Rehabilitation: Provide vocational rehab assistance to qualifying candidates.   Vocational Rehab Evaluation & Intervention: Vocational Rehab - 06/11/17 1359      Initial Vocational Rehab Evaluation & Intervention   Assessment shows need for Vocational Rehabilitation  No Still works part-time       Education: Education Goals: Education classes will be provided on a weekly basis, covering required topics. Participant will state understanding/return demonstration of topics presented.  Learning Barriers/Preferences: Learning Barriers/Preferences - 06/11/17 1358      Learning Barriers/Preferences   Learning Barriers  None    Learning Preferences  Group Instruction;Individual Instruction;Pictoral;Skilled Demonstration;Verbal Instruction;Video;Written Material;Computer/Internet;Audio       Education Topics: Hypertension, Hypertension Reduction -Define heart disease and high blood pressure. Discus how high blood pressure affects the body and ways to reduce high blood pressure.   CARDIAC REHAB PHASE II EXERCISE from 08/19/2017 in [place]  Date  07/01/17  Educator  DC  Instruction Review Code  2- Demonstrated Understanding      Exercise and Your Heart -Discuss why it is important to exercise, the FITT principles of exercise, normal and abnormal responses to exercise, and how to exercise safely.   Angina -Discuss  definition of angina, causes of angina, treatment of angina, and how to decrease risk of having angina.   CARDIAC REHAB PHASE II EXERCISE from 08/19/2017 in [place]  Date  07/15/17  Educator  DJ  Instruction Review Code  2- Demonstrated Understanding      Cardiac Medications -Review what the following cardiac medications are used for, how they affect the body,  and side effects that may occur when taking the medications.  Medications include Aspirin, Beta blockers, calcium channel blockers, ACE Inhibitors, angiotensin receptor blockers, diuretics, digoxin, and antihyperlipidemics.   CARDIAC REHAB PHASE II EXERCISE from 08/19/2017 in [place]  Date  07/22/17  Educator  DC  Instruction Review Code  2- Demonstrated Understanding      Congestive Heart Failure -Discuss the definition of CHF, how to live with CHF, the signs and symptoms of CHF, and how keep track of weight and sodium intake.   CARDIAC REHAB PHASE II EXERCISE from 08/19/2017 in [place]  Date  07/29/17  Educator  DC  Instruction Review Code  2- Demonstrated Understanding      Heart Disease and Intimacy -Discus the effect sexual activity has on the heart, how changes occur during intimacy as we age, and safety during sexual activity.   CARDIAC REHAB PHASE II EXERCISE from 08/19/2017 in [place]  Date  08/05/17  Educator  DJ  Instruction Review Code  2- Demonstrated Understanding      Smoking Cessation / COPD -Discuss different methods to quit smoking, the health benefits of quitting smoking, and the definition of COPD.   CARDIAC REHAB PHASE II EXERCISE from 08/19/2017 in [place]  Date  08/13/17  Educator  DC  Instruction Review Code  2- Demonstrated Understanding      Nutrition I: Fats -Discuss the types of cholesterol, what cholesterol does to the heart, and how cholesterol levels can be  controlled.   CARDIAC REHAB PHASE II EXERCISE from 08/19/2017 in [place]  Date  08/19/17  Educator  DC  Instruction Review Code  2- Demonstrated Understanding      Nutrition II: Labels -Discuss the different components of food labels and how to read food label   Heart Parts/Heart Disease and PAD -Discuss the anatomy of the heart, the pathway of blood circulation through the heart, and these are affected by heart disease.   Stress I: Signs and Symptoms -Discuss the causes of stress, how stress may lead to anxiety and depression, and ways to limit stress.   Stress II: Relaxation -Discuss different types of relaxation techniques to limit stress.   CARDIAC REHAB PHASE II EXERCISE from 08/19/2017 in [place]  Date  06/17/17  Educator  DC  Instruction Review Code  2- Demonstrated Understanding      Warning Signs of Stroke / TIA -Discuss definition of a stroke, what the signs and symptoms are of a stroke, and how to identify when someone is having stroke.   CARDIAC REHAB PHASE II EXERCISE from 08/19/2017 in [place]  Date  06/24/17  Educator  DC  Instruction Review Code  2- Demonstrated Understanding      Knowledge Questionnaire Score: Knowledge Questionnaire Score - 06/11/17 1359      Knowledge Questionnaire Score   Pre Score  23/24       Core Components/Risk Factors/Patient Goals at Admission: Personal Goals and Risk Factors at Admission - 06/11/17 1406      Core Components/Risk Factors/Patient Goals on Admission    Weight Management  Weight Maintenance    Personal Goal Other  Yes    Personal Goal  Get better, Live longer and healthier    Intervention  Attend CR 3xweek and supplement with home exercise 2 x week.     Expected Outcomes  Achieve personal goals       Core Components/Risk  Factors/Patient Goals Review:  Goals and Risk Factor Review    Row Name 07/08/17 1447 07/27/17 1446 08/24/17  1458         Core Components/Risk Factors/Patient Goals Review   Personal Goals Review  Weight Management/Obesity Get better; live longer and healthier.   Weight Management/Obesity Get better; live longer and healthier.   Weight Management/Obesity Get better; live longer and healthier.      Review  Patient  has completed 10 sessions losing 2 lbs. He is doing well in the program with some progression. His has been hypertensive since he started. He say his cardiologist today 2/13 and h e added Lisinopril 5 mg daily. He says he is feeling somewhat stronger but has not really noticed a big difference yet. Will continue to monitor for progress.   Patient has completed 18 sessions losing 1 lb. He continues to do well in the program with progression. He says he feels better but still feels weak. His cardiologist has adjusted his antihypertensive medications. His blood pressure readings have improved but continue to be hypertensive. Will continue to monitor.  Patient has completed 30 sessions losing 4 lbs overall. He continues to do well in the program with progression. He says he feels stronger but still feel weak in the morning. I advised him to speak with his physician about this. He was able to work out in his yard recently without difficulty or fatigue and he was pleased about this. He is still hypertensive. We contacted Dr. Bronson Ing and he increased his Lisonopril. We are still monitoring.     Expected Outcomes  Patient will continue to attend sessions and complete the program meeting his personal goals.   Patient will continue to attend sessions and complete the program meeting his personal goals.   Patient will continue to attend sessions and complete the program meeting his personal goals.         Core Components/Risk Factors/Patient Goals at Discharge (Final Review):  Goals and Risk Factor Review - 08/24/17 1458      Core Components/Risk Factors/Patient Goals Review   Personal Goals Review  Weight  Management/Obesity Get better; live longer and healthier.     Review  Patient has completed 30 sessions losing 4 lbs overall. He continues to do well in the program with progression. He says he feels stronger but still feel weak in the morning. I advised him to speak with his physician about this. He was able to work out in his yard recently without difficulty or fatigue and he was pleased about this. He is still hypertensive. We contacted Dr. Bronson Ing and he increased his Lisonopril. We are still monitoring.    Expected Outcomes  Patient will continue to attend sessions and complete the program meeting his personal goals.        ITP Comments: ITP Comments    Row Name 06/11/17 1400 06/11/17 1528         ITP Comments  Mr. Quiroa is tarting our CR program on Monday 06/15/17 at 8:15 on Monday and Wednesday and at 3:45 on Friday. 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 It usually goes away after resting.  Patient is new to the program. He plans to start Monday 06/15/2017.         Comments: ITP 30 Day REVIEW Patient doing well in the program. Will continue to monitor for progress.

## 2017-08-24 NOTE — Progress Notes (Signed)
Daily Session Note  Patient Details  Name: Kyle H Rufer Jr. MRN: [id number] Date of Birth: 09/26/1945 Referring Provider:     CARDIAC REHAB PHASE II ORIENTATION from 06/11/2017 in [place] [facility]  Referring Provider  Dr. Varanasi      Encounter Date: 08/24/2017  Check In: Session Check In - 08/24/17 0853      Check-In   Location  [facility]    Staff Present  Diane Coad, MS, EP, [facility], Exercise Physiologist;Gregory Cowan, BS, EP, Exercise Physiologist;Debra Johnson, RN, BSN    Supervising physician immediately available to respond to emergencies  See telemetry face sheet for immediately available MD    Medication changes reported      No    Fall or balance concerns reported     No    Tobacco Cessation  No Change    Warm-up and Cool-down  Performed as group-led instruction    Resistance Training Performed  Yes    VAD Patient?  No      Pain Assessment   Currently in Pain?  No/denies    Pain Score  0-No pain    Multiple Pain Sites  No       Capillary Blood Glucose: No results found for this or any previous visit (from the past 24 hour(s)).    Social History   Tobacco Use  Smoking Status Former Smoker  . Packs/day: 0.10  . Years: 20.00  . Pack years: 2.00  . Types: Cigarettes  . Last attempt to quit: 04/25/2004  . Years since quitting: 13.3  Smokeless Tobacco Never Used    Goals Met:  Independence with exercise equipment Exercise tolerated well No report of cardiac concerns or symptoms Strength training completed today  Goals Unmet:  Not Applicable  Comments: Check out: 0915   Dr. Suresh Koneswaran is Medical Director for [place] [facility]. 

## 2017-08-26 ENCOUNTER — Encounter (HOSPITAL_COMMUNITY)
Admission: RE | Admit: 2017-08-26 | Discharge: 2017-08-26 | Disposition: A | Discharge status: Home / Self Care | Payer: Medicare Other | Source: Ambulatory Visit | Attending: Cardiovascular Disease | Admitting: Cardiovascular Disease

## 2017-08-26 DIAGNOSIS — I214 Non-ST elevation (NSTEMI) myocardial infarction: Secondary | ICD-10-CM | POA: Diagnosis not present

## 2017-08-26 DIAGNOSIS — Z9861 Coronary angioplasty status: Secondary | ICD-10-CM

## 2017-08-26 NOTE — Progress Notes (Signed)
Daily Session Note  Patient Details  Name: Kyle Patel. MRN: [id number] Date of Birth: 1946/03/30 Referring Provider:     CARDIAC REHAB PHASE II ORIENTATION from 06/11/2017 in [place]  Referring Provider  Dr. Irish Lack      Encounter Date: 08/26/2017  Check In: Session Check In - 08/26/17 0815      Check-In   Location  [facility]    Staff Present  Diane Angelina Pih, MS, EP, [facility], Exercise Physiologist;Gregory Luther Parody, BS, EP, Exercise Physiologist;Aliese Brannum Wynetta Emery, RN, BSN    Supervising physician immediately available to respond to emergencies  See telemetry face sheet for immediately available MD    Medication changes reported      No    Fall or balance concerns reported     No    Warm-up and Cool-down  Performed as group-led instruction    Resistance Training Performed  Yes    VAD Patient?  No      Pain Assessment   Currently in Pain?  No/denies    Pain Score  0-No pain    Multiple Pain Sites  No       Capillary Blood Glucose: No results found for this or any previous visit (from the past 24 hour(s)).    Social History   Tobacco Use  Smoking Status Former Smoker  . Packs/day: 0.10  . Years: 20.00  . Pack years: 2.00  . Types: Cigarettes  . Last attempt to quit: 04/25/2004  . Years since quitting: 13.3  Smokeless Tobacco Never Used    Goals Met:  Independence with exercise equipment Exercise tolerated well No report of cardiac concerns or symptoms Strength training completed today  Goals Unmet:  Not Applicable  Comments: Check out 915.   Dr. Kate Sable is Medical Director for [facility] Cardiac and Pulmonary Rehab.

## 2017-08-28 ENCOUNTER — Encounter (HOSPITAL_COMMUNITY)
Admission: RE | Admit: 2017-08-28 | Discharge: 2017-08-28 | Disposition: A | Discharge status: Home / Self Care | Payer: Medicare Other | Source: Ambulatory Visit | Attending: Cardiovascular Disease | Admitting: Cardiovascular Disease

## 2017-08-28 DIAGNOSIS — I214 Non-ST elevation (NSTEMI) myocardial infarction: Secondary | ICD-10-CM | POA: Diagnosis not present

## 2017-08-28 DIAGNOSIS — Z9861 Coronary angioplasty status: Secondary | ICD-10-CM

## 2017-08-28 NOTE — Progress Notes (Signed)
Daily Session Note  Patient Details  Name: Kyle Patel. MRN: [id number] Date of Birth: 1945/12/12 Referring Provider:     CARDIAC REHAB PHASE II ORIENTATION from 06/11/2017 in [place]  Referring Provider  Dr. Irish Lack      Encounter Date: 08/28/2017  Check In: Session Check In - 08/28/17 0815      Check-In   Location  [facility]    Staff Present  Diane Angelina Pih, MS, EP, [facility], Exercise Physiologist;Gregory Luther Parody, BS, EP, Exercise Physiologist;Leocadia Idleman Wynetta Emery, RN, BSN    Supervising physician immediately available to respond to emergencies  See telemetry face sheet for immediately available MD    Medication changes reported      No    Fall or balance concerns reported     No    Warm-up and Cool-down  Performed as group-led instruction    Resistance Training Performed  Yes    VAD Patient?  No      Pain Assessment   Currently in Pain?  No/denies    Pain Score  0-No pain    Multiple Pain Sites  No       Capillary Blood Glucose: No results found for this or any previous visit (from the past 24 hour(s)).    Social History   Tobacco Use  Smoking Status Former Smoker  . Packs/day: 0.10  . Years: 20.00  . Pack years: 2.00  . Types: Cigarettes  . Last attempt to quit: 04/25/2004  . Years since quitting: 13.3  Smokeless Tobacco Never Used    Goals Met:  Independence with exercise equipment Exercise tolerated well No report of cardiac concerns or symptoms Strength training completed today  Goals Unmet:  Not Applicable  Comments: Check out 915.   Dr. Kate Sable is Medical Director for [facility] Cardiac and Pulmonary Rehab.

## 2017-08-31 ENCOUNTER — Encounter (HOSPITAL_COMMUNITY)
Admission: RE | Admit: 2017-08-31 | Discharge: 2017-08-31 | Disposition: A | Discharge status: Home / Self Care | Payer: Medicare Other | Source: Ambulatory Visit | Attending: Cardiovascular Disease | Admitting: Cardiovascular Disease

## 2017-08-31 DIAGNOSIS — I214 Non-ST elevation (NSTEMI) myocardial infarction: Secondary | ICD-10-CM | POA: Diagnosis not present

## 2017-08-31 NOTE — Progress Notes (Signed)
Daily Session Note  Patient Details  Name: Travion Ke. MRN: [id number] Date of Birth: February 13, 1946 Referring Provider:     CARDIAC REHAB PHASE II ORIENTATION from 06/11/2017 in [place]  Referring Provider  Dr. Irish Lack      Encounter Date: 08/31/2017  Check In: Session Check In - 08/31/17 0815      Check-In   Location  [facility]    Staff Present  Russella Dar, MS, EP, [facility], Exercise Physiologist;Gregory Luther Parody, BS, EP, Exercise Physiologist    Medication changes reported      No    Fall or balance concerns reported     No    Tobacco Cessation  No Change    Warm-up and Cool-down  Performed as group-led instruction    Resistance Training Performed  Yes    VAD Patient?  No      Pain Assessment   Currently in Pain?  No/denies    Pain Score  0-No pain    Multiple Pain Sites  No       Capillary Blood Glucose: No results found for this or any previous visit (from the past 24 hour(s)).    Social History   Tobacco Use  Smoking Status Former Smoker  . Packs/day: 0.10  . Years: 20.00  . Pack years: 2.00  . Types: Cigarettes  . Last attempt to quit: 04/25/2004  . Years since quitting: 13.3  Smokeless Tobacco Never Used    Goals Met:  Independence with exercise equipment Exercise tolerated well No report of cardiac concerns or symptoms Strength training completed today  Goals Unmet:  Not Applicable  Comments: Check out: 0915   Dr. Kate Sable is Medical Director for [place] and Pulmonary Rehab.

## 2017-09-02 ENCOUNTER — Encounter (HOSPITAL_COMMUNITY): Payer: Medicare Other

## 2017-09-02 ENCOUNTER — Encounter (HOSPITAL_COMMUNITY)
Admission: RE | Admit: 2017-09-02 | Discharge: 2017-09-02 | Disposition: A | Discharge status: Home / Self Care | Payer: Medicare Other | Source: Ambulatory Visit | Attending: Cardiovascular Disease | Admitting: Cardiovascular Disease

## 2017-09-02 DIAGNOSIS — I214 Non-ST elevation (NSTEMI) myocardial infarction: Secondary | ICD-10-CM | POA: Diagnosis not present

## 2017-09-02 DIAGNOSIS — Z9861 Coronary angioplasty status: Secondary | ICD-10-CM

## 2017-09-02 NOTE — Progress Notes (Signed)
Daily Session Note  Patient Details  Name: Kyle Patel. MRN: [id number] Date of Birth: 1946-01-09 Referring Provider:     CARDIAC REHAB PHASE II ORIENTATION from 06/11/2017 in [place]  Referring Provider  Dr. Irish Lack      Encounter Date: 09/02/2017  Check In: Session Check In - 09/02/17 0845      Check-In   Location  [facility]    Staff Present  Diane Angelina Pih, MS, EP, [facility], Exercise Physiologist;Clayton Bosserman Luther Parody, BS, EP, Exercise Physiologist;Debra Wynetta Emery, RN, BSN    Supervising physician immediately available to respond to emergencies  See telemetry face sheet for immediately available MD    Medication changes reported      No    Fall or balance concerns reported     No    Tobacco Cessation  No Change    Warm-up and Cool-down  Performed as group-led instruction    Resistance Training Performed  Yes    VAD Patient?  No      Pain Assessment   Currently in Pain?  No/denies    Pain Score  0-No pain    Multiple Pain Sites  No       Capillary Blood Glucose: No results found for this or any previous visit (from the past 24 hour(s)).    Social History   Tobacco Use  Smoking Status Former Smoker  . Packs/day: 0.10  . Years: 20.00  . Pack years: 2.00  . Types: Cigarettes  . Last attempt to quit: 04/25/2004  . Years since quitting: 13.3  Smokeless Tobacco Never Used    Goals Met:  Independence with exercise equipment Exercise tolerated well No report of cardiac concerns or symptoms Strength training completed today  Goals Unmet:  Not Applicable  Comments: Check out 915   Dr. Kate Sable is Medical Director for [place] and Pulmonary Rehab.

## 2017-09-04 ENCOUNTER — Encounter (HOSPITAL_COMMUNITY)
Admission: RE | Admit: 2017-09-04 | Discharge: 2017-09-04 | Disposition: A | Discharge status: Home / Self Care | Payer: Medicare Other | Source: Ambulatory Visit | Attending: Cardiovascular Disease | Admitting: Cardiovascular Disease

## 2017-09-04 ENCOUNTER — Encounter (HOSPITAL_COMMUNITY): Payer: Medicare Other

## 2017-09-04 DIAGNOSIS — Z9861 Coronary angioplasty status: Secondary | ICD-10-CM

## 2017-09-04 DIAGNOSIS — I214 Non-ST elevation (NSTEMI) myocardial infarction: Secondary | ICD-10-CM | POA: Diagnosis not present

## 2017-09-04 NOTE — Progress Notes (Signed)
Daily Session Note  Patient Details  Name: Kyle Patel. MRN: [id number] Date of Birth: 1946/02/12 Referring Provider:     CARDIAC REHAB PHASE II ORIENTATION from 06/11/2017 in [place]  Referring Provider  Dr. Irish Lack      Encounter Date: 09/04/2017  Check In: Session Check In - 09/04/17 0815      Check-In   Location  [facility]    Staff Present  Aundra Dubin, RN, BSN;Gregory Luther Parody, BS, EP, Exercise Physiologist    Supervising physician immediately available to respond to emergencies  See telemetry face sheet for immediately available MD    Medication changes reported      No    Fall or balance concerns reported     No    Warm-up and Cool-down  Performed as group-led instruction    Resistance Training Performed  Yes    VAD Patient?  No      Pain Assessment   Currently in Pain?  No/denies    Pain Score  0-No pain    Multiple Pain Sites  No       Capillary Blood Glucose: No results found for this or any previous visit (from the past 24 hour(s)).    Social History   Tobacco Use  Smoking Status Former Smoker  . Packs/day: 0.10  . Years: 20.00  . Pack years: 2.00  . Types: Cigarettes  . Last attempt to quit: 04/25/2004  . Years since quitting: 13.3  Smokeless Tobacco Never Used    Goals Met:  Independence with exercise equipment Exercise tolerated well No report of cardiac concerns or symptoms Strength training completed today  Goals Unmet:  Not Applicable  Comments: Check out 915.   Dr. Kate Sable is Medical Director for [facility] Pul[facility] Re[facility].

## 2017-09-07 ENCOUNTER — Encounter (HOSPITAL_COMMUNITY)
Admission: RE | Admit: 2017-09-07 | Discharge: 2017-09-07 | Disposition: A | Discharge status: Home / Self Care | Payer: Medicare Other | Source: Ambulatory Visit | Attending: Cardiovascular Disease | Admitting: Cardiovascular Disease

## 2017-09-07 VITALS — Ht 69.5 in | Wt 185.3 lb

## 2017-09-07 DIAGNOSIS — I214 Non-ST elevation (NSTEMI) myocardial infarction: Secondary | ICD-10-CM

## 2017-09-07 DIAGNOSIS — Z9861 Coronary angioplasty status: Secondary | ICD-10-CM

## 2017-09-07 NOTE — Progress Notes (Signed)
Discharge Progress Report  Patient Details  Name: Kyle Patel. MRN: [id number] Date of Birth: 10/25/1945 Referring Provider:     CARDIAC REHAB PHASE II ORIENTATION from 06/11/2017 in [place]  Referring Provider  Dr. Irish Lack       Number of Visits: 36  Reason for Discharge:  Patient reached a stable level of exercise. Patient independent in their exercise. Patient has met program and personal goals.  Smoking History:  Social History   Tobacco Use  Smoking Status Former Smoker  . Packs/day: 0.10  . Years: 20.00  . Pack years: 2.00  . Types: Cigarettes  . Last attempt to quit: 04/25/2004  . Years since quitting: 13.3  Smokeless Tobacco Never Used    Diagnosis:  NSTEMI (non-ST elevated myocardial infarction) ([place])  S/P PTCA (percutaneous transluminal coronary angioplasty)  ADL [facility]:   Initial Exercise Prescription: Initial Exercise Prescription - 06/11/17 0800      Date of Initial Exercise RX and Referring Provider   Date  06/11/17    Referring Provider  Dr. Irish Lack      Treadmill   MPH  1.5    Grade  0    Minutes  15    METs  2      NuStep   Level  2    SPM  96    Minutes  20    METs  2.1      Prescription Details   Frequency (times per week)  3    Duration  Progress to 30 minutes of continuous aerobic without signs/symptoms of physical distress      Intensity   THRR 40-80% of Max Heartrate  [phone]    Ratings of Perceived Exertion  11-13    Perceived Dyspnea  0-4      Progression   Progression  Continue progressive overload as per policy without signs/symptoms or physical distress.      Resistance Training   Training Prescription  Yes    Weight  1    Reps  10-15       Discharge Exercise Prescription (Final Exercise Prescription Changes): Exercise Prescription Changes - 08/18/17 1200      Response to Exercise   Blood Pressure (Admit)  150/70    Blood Pressure (Exercise)  172/82    Blood Pressure (Exit)   130/78    Heart Rate (Admit)  62 bpm    Heart Rate (Exercise)  115 bpm    Heart Rate (Exit)  71 bpm    Rating of Perceived Exertion (Exercise)  10    Duration  Progress to 30 minutes of  aerobic without signs/symptoms of physical distress    Intensity  THRR New [phone]      Progression   Progression  Continue to progress workloads to maintain intensity without signs/symptoms of physical distress.      Resistance Training   Training Prescription  Yes    Weight  4    Reps  10-15      Treadmill   MPH  2.6    Grade  0    Minutes  15    METs  2.9      NuStep   Level  3    SPM  158    Minutes  20    METs  2      Home Exercise Plan   Plans to continue exercise at  Home (comment)    Frequency  Add 2 additional days to program exercise sessions.  Initial Home Exercises Provided  06/17/17       Functional Capacity: 6 Minute Walk    Row Name 06/11/17 0932 09/07/17 1227       6 Minute Walk   Phase  Initial  Discharge    Distance  1250 feet  1300 feet    Distance % Change  0 %  4 %    Distance Feet Change  0 ft  50 ft    Walk Time  6 minutes  6 minutes    # of Rest Breaks  0  0    MPH  2.36  2.46    METS  2.81  2.88    RPE  11  9    Perceived Dyspnea   11  11    VO2 Peak  11.51  11.98    Symptoms  No  No    Resting HR  93 bpm  64 bpm    Resting BP  144/84  142/70    Resting Oxygen Saturation   95 %  95 %    Exercise Oxygen Saturation  during 6 min walk  93 %  89 %    Max Ex. HR  121 bpm  107 bpm    Max Ex. BP  152/84  172/76    2 Minute Post BP  136/80  144/74       Psychological, QOL, Others - Outcomes: PHQ 2/9: Depression screen [facility] 2/9 09/07/2017 06/11/2017  Decreased Interest 0 0  Down, Depressed, Hopeless 0 0  PHQ - 2 Score 0 0  Altered sleeping 3 2  Tired, decreased energy 3 0  Change in appetite 0 0  Feeling bad or failure about yourself  0 0  Trouble concentrating 0 0  Moving slowly or fidgety/restless 0 0  Suicidal thoughts 0 0  PHQ-9 Score  6 2  Difficult doing work/chores - Somewhat difficult    Quality of Life: Quality of Life - 09/07/17 1229      Quality of Life Scores   Health/Function Pre  24.54 %    Health/Function Post  23.8 %    Health/Function % Change  -3.02 %    Socioeconomic Pre  29.38 %    Socioeconomic Post  27.92 %    Socioeconomic % Change   -4.97 %    Psych/Spiritual Pre  30 %    Psych/Spiritual Post  27.43 %    Psych/Spiritual % Change  -8.57 %    Family Pre  27.6 %    Family Post  27.6 %    Family % Change  0 %    GLOBAL Pre  27.33 %    GLOBAL Post  25.89 %    GLOBAL % Change  -5.27 %       Personal Goals: Goals established at orientation with interventions provided to work toward goal. Personal Goals and Risk Factors at Admission - 06/11/17 1406      Core Components/Risk Factors/Patient Goals on Admission    Weight Management  Weight Maintenance    Personal Goal Other  Yes    Personal Goal  Get better, Live longer and healthier    Intervention  Attend CR 3xweek and supplement with home exercise 2 x week.     Expected Outcomes  Achieve personal goals        Personal Goals Discharge: Goals and Risk Factor Review    Row Name 07/08/17 1447 07/27/17 1446 08/24/17 1458 09/07/17 1420  Core Components/Risk Factors/Patient Goals Review   Personal Goals Review  Weight Management/Obesity Get better; live longer and healthier.   Weight Management/Obesity Get better; live longer and healthier.   Weight Management/Obesity Get better; live longer and healthier.   Weight Management/Obesity Get better; live longer and healthier.     Review  Patient  has completed 10 sessions losing 2 lbs. He is doing well in the program with some progression. His has been hypertensive since he started. He say his cardiologist today 2/13 and h e added Lisinopril 5 mg daily. He says he is feeling somewhat stronger but has not really noticed a big difference yet. Will continue to monitor for progress.   Patient has  completed 18 sessions losing 1 lb. He continues to do well in the program with progression. He says he feels better but still feels weak. His cardiologist has adjusted his antihypertensive medications. His blood pressure readings have improved but continue to be hypertensive. Will continue to monitor.  Patient has completed 30 sessions losing 4 lbs overall. He continues to do well in the program with progression. He says he feels stronger but still feel weak in the morning. I advised him to speak with his physician about this. He was able to work out in his yard recently without difficulty or fatigue and he was pleased about this. He is still hypertensive. We contacted Dr. Bronson Ing and he increased his Lisonopril. We are still monitoring.  Patient graduated with 36 sessions losing 5.2 lbs overall. He did well in the program. His exit measurements improved in grip strength and balance. His exit walk test improved by 4.1%. His medifcts score did not improve at a 4% increase. Patient says he is eating healthier and is pleased with his weight loss. He says the program has helped him feel stronger and have more stamina. His hypertension has improved averaging initial b/p at 135/70. Patient thinks his readings should be lower. He has an appointment with his cardiologist in May and I advised him to discuss this with him. Patient has purchased a treadmill and plans to continue exercising at home.     Expected Outcomes  Patient will continue to attend sessions and complete the program meeting his personal goals.   Patient will continue to attend sessions and complete the program meeting his personal goals.   Patient will continue to attend sessions and complete the program meeting his personal goals.   Patient will continue exercsing at home on his treadmill and continue to meet his personal goals. CR will f/u for one year.        Exercise Goals and Review: Exercise Goals    Row Name 06/11/17 2330              Exercise Goals   Increase Physical Activity  Yes       Intervention  Provide advice, education, support and counseling about physical activity/exercise needs.;Develop an individualized exercise prescription for aerobic and resistive training based on initial evaluation findings, risk stratification, comorbidities and participant's personal goals.       Expected Outcomes  Short Term: Attend rehab on a regular basis to increase amount of physical activity.       Increase Strength and Stamina  Yes       Intervention  Provide advice, education, support and counseling about physical activity/exercise needs.;Develop an individualized exercise prescription for aerobic and resistive training based on initial evaluation findings, risk stratification, comorbidities and participant's personal goals.  Expected Outcomes  Short Term: Increase workloads from initial exercise prescription for resistance, speed, and METs.       Able to understand and use rate of perceived exertion (RPE) scale  Yes       Intervention  Provide education and explanation on how to use RPE scale       Expected Outcomes  Short Term: Able to use RPE daily in rehab to express subjective intensity level;Long Term:  Able to use RPE to guide intensity level when exercising independently       Able to understand and use Dyspnea scale  Yes       Intervention  Provide education and explanation on how to use Dyspnea scale       Expected Outcomes  Short Term: Able to use Dyspnea scale daily in rehab to express subjective sense of shortness of breath during exertion;Long Term: Able to use Dyspnea scale to guide intensity level when exercising independently       Knowledge and understanding of Target Heart Rate Range (THRR)  Yes       Intervention  Provide education and explanation of THRR including how the numbers were predicted and where they are located for reference       Expected Outcomes  Short Term: Able to state/look up THRR       Able to  check pulse independently  Yes       Intervention  Provide education and demonstration on how to check pulse in carotid and radial arteries.;Review the importance of being able to check your own pulse for safety during independent exercise       Expected Outcomes  Long Term: Able to check pulse independently and accurately;Short Term: Able to explain why pulse checking is important during independent exercise       Understanding of Exercise Prescription  Yes       Intervention  Provide education, explanation, and written materials on patient's individual exercise prescription       Expected Outcomes  Short Term: Able to explain program exercise prescription;Long Term: Able to explain home exercise prescription to exercise independently          Nutrition & Weight - Outcomes: Pre Biometrics - 06/11/17 0817      Pre Biometrics   Height  5' 9.5" (1.765 m)    Weight  190 lb 1.6 oz (86.2 kg)    Waist Circumference  40.5 inches    Hip Circumference  40 inches    Waist to Hip Ratio  1.01 %    BMI (Calculated)  27.68    Triceps Skinfold  12 mm    % Body Fat  26.8 %    Grip Strength  58.33 kg    Flexibility  0 in    Single Leg Stand  17 seconds      Post Biometrics - 09/07/17 1228       Post  Biometrics   Height  5' 9.5" (1.765 m)    Weight  185 lb 4.4 oz (84 kg)    Waist Circumference  39 inches    Hip Circumference  37 inches    Waist to Hip Ratio  1.05 %    BMI (Calculated)  26.98    Triceps Skinfold  12 mm    % Body Fat  25.8 %    Grip Strength  58.46 kg    Flexibility  0 in    Single Leg Stand  7 seconds  Nutrition: Nutrition Therapy & Goals - 08/24/17 1457      Nutrition Therapy   RD appointment deferred  Yes      Personal Nutrition Goals   Personal Goal #2  Patient continues to eat baked meats, low sodium, low fat choices.     Additional Goals?  No      Intervention Plan   Intervention  Nutrition handout(s) given to patient.    Expected Outcomes  Short Term  Goal: Understand basic principles of dietary content, such as calories, fat, sodium, cholesterol and nutrients.;Long Term Goal: Adherence to prescribed nutrition plan.       Nutrition Discharge: Nutrition Assessments - 09/07/17 1418      MEDFICTS Scores   Pre Score  38    Post Score  42    Score Difference  4       Education Questionnaire Score: Knowledge Questionnaire Score - 09/07/17 1417      Knowledge Questionnaire Score   Pre Score  23/24    Post Score  23/24       Goals reviewed with patient; copy given to patient.

## 2017-09-07 NOTE — Progress Notes (Signed)
Cardiac Individual Treatment Plan  Patient Details  Name: Kyle Patel. MRN: [id number] Date of Birth: [birth year] Referring Provider:     CARDIAC REHAB PHASE II ORIENTATION from 06/11/2017 in [facility]  Referring Provider  Dr. Irish Lack      Initial Encounter Date:    CARDIAC REHAB PHASE II ORIENTATION from 06/11/2017 in [facility]  Date  06/11/17  Referring Provider  Dr. Irish Lack      Visit Diagnosis: NSTEMI (non-ST elevated myocardial infarction) ([place])  S/P PTCA (percutaneous transluminal coronary angioplasty)  Patient's Home Medications on Admission:  Current Outpatient Medications:  .  aspirin 81 MG EC tablet, Take 81 mg by mouth daily., Disp: , Rfl:  .  cholecalciferol (VITAMIN D) 1000 UNITS tablet, Take 1,000 Units by mouth daily., Disp: , Rfl:  .  Coenzyme Q10-Vitamin E (QUNOL ULTRA COQ10) 100-150 MG-UNIT CAPS, Take 1 capsule by mouth at bedtime., Disp: , Rfl:  .  lisinopril (PRINIVIL,ZESTRIL) 10 MG tablet, Take 1 tablet (10 mg total) by mouth daily., Disp: 90 tablet, Rfl: 3 .  LYRICA 75 MG capsule, Take 75 mg by mouth every evening., Disp: , Rfl:  .  metoprolol succinate (TOPROL-XL) 25 MG 24 hr tablet, Take 1 tablet (25 mg total) by mouth daily., Disp: 90 tablet, Rfl: 1 .  Misc Natural Products (OSTEO BI-FLEX ADV JOINT SHIELD) TABS, Take 1 tablet by mouth daily., Disp: , Rfl:  .  Multiple Vitamins-Minerals (CENTRUM PO), Take 1 tablet by mouth every morning., Disp: , Rfl:  .  nitroGLYCERIN (NITROSTAT) 0.4 MG SL tablet, Place 1 tablet (0.4 mg total) under the tongue every 5 (five) minutes x 3 doses as needed for chest pain., Disp: 25 tablet, Rfl: 2 .  rosuvastatin (CRESTOR) 20 MG tablet, Take 1 tablet (20 mg total) by mouth daily., Disp: 90 tablet, Rfl: 3 .  ticagrelor (BRILINTA) 90 MG TABS tablet, Take 1 tablet (90 mg total) by mouth 2 (two) times daily., Disp: 180 tablet, Rfl: 2 .  trolamine salicylate (ASPERCREME) 10 % cream,  Apply 1 application topically at bedtime as needed (For arthritis pain in knee)., Disp: , Rfl:   Past Medical History: Past Medical History:  Diagnosis Date  . Arthritis   . 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 COPD (chronic obstructive pulmonary disease) ([place])   . 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 Hypercholesteremia   . Hypertension   . Pulmonary embolism ([place]) 10/2011    Tobacco Use: Social History   Tobacco Use  Smoking Status Former Smoker  . Packs/day: 0.10  . Years: 20.00  . Pack years: 2.00  . Types: Cigarettes  . Last attempt to quit: 04/25/2004  . Years since quitting: 13.3  Smokeless Tobacco Never Used    Labs: Recent Chemical engineer    Labs for ITP Cardiac and Pulmonary Rehab Latest Ref Rng & Units 04/24/2017 04/25/2017 07/21/2017   Cholestrol 0 - 200 mg/dL - 130 147   LDLCALC 0 - 99 mg/dL - 72 81   HDL >40 mg/dL - 49 49   Trlycerides <150 mg/dL - 47 83   Hemoglobin A1c 4.8 - 5.6 % 6.4(H) - -      Capillary Blood Glucose: Lab Results  Component Value Date   GLUCAP 114 (H) 11/18/2011   GLUCAP 124 (H) 11/16/2011   GLUCAP 102 (H) 11/10/2011     Exercise Target Goals:    Exercise Program Goal: Individual exercise prescription set  using results from initial 6 min walk test and THRR while considering  patient's activity barriers and safety.   Exercise Prescription Goal: Starting with aerobic activity 30 plus minutes a day, 3 days per week for initial exercise prescription. Provide home exercise prescription and guidelines that participant acknowledges understanding prior to discharge.  Activity Barriers & Risk Stratification: Activity Barriers & Cardiac Risk Stratification - 06/11/17 0818      Activity Barriers & Cardiac Risk Stratification   Cardiac Risk Stratification  High       6 Minute Walk: 6 Minute Walk    Row Name 06/11/17 0932 09/07/17 1227       6 Minute Walk   Phase  Initial  Discharge     Distance  1250 feet  1300 feet    Distance % Change  0 %  4 %    Distance Feet Change  0 ft  50 ft    Walk Time  6 minutes  6 minutes    # of Rest Breaks  0  0    MPH  2.36  2.46    METS  2.81  2.88    RPE  11  9    Perceived Dyspnea   11  11    VO2 Peak  11.51  11.98    Symptoms  No  No    Resting HR  93 bpm  64 bpm    Resting BP  144/84  142/70    Resting Oxygen Saturation   95 %  95 %    Exercise Oxygen Saturation  during 6 min walk  93 %  89 %    Max Ex. HR  121 bpm  107 bpm    Max Ex. BP  152/84  172/76    2 Minute Post BP  136/80  144/74       Oxygen Initial Assessment: Oxygen Initial Assessment - 06/11/17 1403      Home Oxygen   Home Oxygen Device  None    Sleep Oxygen Prescription  None    Home Exercise Oxygen Prescription  None    Home at Rest Exercise Oxygen Prescription  None      Initial 6 min Walk   Oxygen Used  None       Oxygen Re-Evaluation:   Oxygen Discharge (Final Oxygen Re-Evaluation):   Initial Exercise Prescription: Initial Exercise Prescription - 06/11/17 0800      Date of Initial Exercise RX and Referring Provider   Date  06/11/17    Referring Provider  Dr. Irish Lack      Treadmill   MPH  1.5    Grade  0    Minutes  15    METs  2      NuStep   Level  2    SPM  96    Minutes  20    METs  2.1      Prescription Details   Frequency (times per week)  3    Duration  Progress to 30 minutes of continuous aerobic without signs/symptoms of physical distress      Intensity   THRR 40-80% of Max Heartrate  [phone]    Ratings of Perceived Exertion  11-13    Perceived Dyspnea  0-4      Progression   Progression  Continue progressive overload as per policy without signs/symptoms or physical distress.      Resistance Training   Training Prescription  Yes    Weight  1  Reps  10-15       Perform Capillary Blood Glucose checks as needed.  Exercise Prescription Changes:  Exercise Prescription Changes    Row Name 06/17/17 0800  06/23/17 0700 07/08/17 1400 07/23/17 1200 08/11/17 0800     Response to Exercise   Blood Pressure (Admit)  -  150/82  152/80  152/72  160/68   Blood Pressure (Exercise)  -  160/82  162/82  170/84  190/78   Blood Pressure (Exit)  -  142/80  136/86  150/60  140/78   Heart Rate (Admit)  -  68 bpm  67 bpm  63 bpm  65 bpm   Heart Rate (Exercise)  -  104 bpm  113 bpm  121 bpm  107 bpm   Heart Rate (Exit)  -  76 bpm  76 bpm  80 bpm  75 bpm   Rating of Perceived Exertion (Exercise)  -  _0 Duration  -  Progress to 30 minutes of  aerobic without signs/symptoms of physical distress  Progress to 30 minutes of  aerobic without signs/symptoms of physical distress  Progress to 30 minutes of  aerobic without signs/symptoms of physical distress  Progress to 30 minutes of  aerobic without signs/symptoms of physical distress   Intensity  -  THRR New [phone]  THRR New [phone]  THRR New [phone]  THRR New [phone]     Progression   Progression  -  Continue to progress workloads to maintain intensity without signs/symptoms of physical distress.  Continue to progress workloads to maintain intensity without signs/symptoms of physical distress.  Continue to progress workloads to maintain intensity without signs/symptoms of physical distress.  Continue to progress workloads to maintain intensity without signs/symptoms of physical distress.     Resistance Training   Training Prescription  Yes  Yes  Yes  Yes  Yes   Weight  _1 Reps  10-15  10-15  10-15  10-15  10-15     Treadmill   MPH  1.5  1.5  2  2.2  2.4   Grade  0  0  0  0  0   Minutes  _2 METs  2  2.1  2.5  2.7  2.8     NuStep   Level  _3 SPM  96  138  119  169  156   Minutes  _4 METs  2.1  2.1  2  2.1  2     Home Exercise Plan   Plans to continue exercise at  Home (comment)  Home (comment)  Home (comment)  Home (comment)  Home (comment)   Frequency  Add 2 additional  days to program exercise sessions.  Add 2 additional days to program exercise sessions.  Add 2 additional days to program exercise sessions.  Add 2 additional days to program exercise sessions.  Add 2 additional days to program exercise sessions.   Initial Home Exercises Provided  06/17/17  06/17/17  06/17/17  06/17/17  06/17/17   Row Name 08/18/17 1200             Response to Exercise   Blood Pressure (Admit)  150/70       Blood Pressure (Exercise)  172/82  Blood Pressure (Exit)  130/78       Heart Rate (Admit)  62 bpm       Heart Rate (Exercise)  115 bpm       Heart Rate (Exit)  71 bpm       Rating of Perceived Exertion (Exercise)  10       Duration  Progress to 30 minutes of  aerobic without signs/symptoms of physical distress       Intensity  THRR New [phone]         Progression   Progression  Continue to progress workloads to maintain intensity without signs/symptoms of physical distress.         Resistance Training   Training Prescription  Yes       Weight  4       Reps  10-15         Treadmill   MPH  2.6       Grade  0       Minutes  15       METs  2.9         NuStep   Level  3       SPM  158       Minutes  20       METs  2         Home Exercise Plan   Plans to continue exercise at  Home (comment)       Frequency  Add 2 additional days to program exercise sessions.       Initial Home Exercises Provided  06/17/17          Exercise Comments:  Exercise Comments    Row Name 06/17/17 2951 06/23/17 0800 07/08/17 1421 07/23/17 1235 08/11/17 0824   Exercise Comments  Patient recieved the take hom eexercise plan today. THR was addressed as were safety guidelines for exercising when not in CR. Patient demonstrated an understanding and was encouraged to ask any future questions as they arise.   Patient is doing well in CR. He has increased his SPMs on the Nustep machine substantially. He is still new to teh program and will be progressed more in time.   Patient  is doing well in CR and has increased his speed on the treadmill to 2.0. Patient has also maintained high SPMs on the nustep and his level. Pat[place] states that he has not felt much improvement yet although it is only his first 10 sessions. Patient has been to the cardiologist about his high blood pressure.   Patient is doing well in CR and has progressed both on the treadmill speed to 2.2 and the Nustep level to level three. Patient states that he feels better overall but is still weak and is concerned about his BP being too high. Patient will be progressed overtime and will be monitored throughout the program.  Patient is doing well in CR. Patient has had BP scares the last few weeks but the physician is aware and is changing his medication. Patient has increased his speed on the treadmill to 2.4 and has maintained his level on the nustep and is remaining strong during sessions.    [place] Name 08/18/17 1301           Exercise Comments  Patient is doing well in CR and has increased his speed on the treadmill. Patient has also increased SPMs on the Nustep machine while maintaining his level. Patient's BP will continued to be monitored throughout the  remainder of the program.           Exercise Goals and Review:  Exercise Goals    Row Name 06/11/17 0488             Exercise Goals   Increase Physical Activity  Yes       Intervention  Provide advice, education, support and counseling about physical activity/exercise needs.;Develop an individualized exercise prescription for aerobic and resistive training based on initial evaluation findings, risk stratification, comorbidities and participant's personal goals.       Expected Outcomes  Short Term: Attend rehab on a regular basis to increase amount of physical activity.       Increase Strength and Stamina  Yes       Intervention  Provide advice, education, support and counseling about physical activity/exercise needs.;Develop an individualized exercise  prescription for aerobic and resistive training based on initial evaluation findings, risk stratification, comorbidities and participant's personal goals.       Expected Outcomes  Short Term: Increase workloads from initial exercise prescription for resistance, speed, and METs.       Able to understand and use rate of perceived exertion (RPE) scale  Yes       Intervention  Provide education and explanation on how to use RPE scale       Expected Outcomes  Short Term: Able to use RPE daily in rehab to express subjective intensity level;Long Term:  Able to use RPE to guide intensity level when exercising independently       Able to understand and use Dyspnea scale  Yes       Intervention  Provide education and explanation on how to use Dyspnea scale       Expected Outcomes  Short Term: Able to use Dyspnea scale daily in rehab to express subjective sense of shortness of breath during exertion;Long Term: Able to use Dyspnea scale to guide intensity level when exercising independently       Knowledge and understanding of Target Heart Rate Range (THRR)  Yes       Intervention  Provide education and explanation of THRR including how the numbers were predicted and where they are located for reference       Expected Outcomes  Short Term: Able to state/look up THRR       Able to check pulse independently  Yes       Intervention  Provide education and demonstration on how to check pulse in carotid and radial arteries.;Review the importance of being able to check your own pulse for safety during independent exercise       Expected Outcomes  Long Term: Able to check pulse independently and accurately;Short Term: Able to explain why pulse checking is important during independent exercise       Understanding of Exercise Prescription  Yes       Intervention  Provide education, explanation, and written materials on patient's individual exercise prescription       Expected Outcomes  Short Term: Able to explain program  exercise prescription;Long Term: Able to explain home exercise prescription to exercise independently          Exercise Goals Re-Evaluation : Exercise Goals Re-Evaluation    [place] Name 07/08/17 1419 07/23/17 1234 08/18/17 1300         Exercise Goal Re-Evaluation   Exercise Goals Review  Increase Physical Activity;Increase Strength and Stamina;Knowledge and understanding of Target Heart Rate Range (THRR)  Increase Physical Activity;Increase Strength and Stamina;Knowledge and understanding of  Target Heart Rate Range (THRR)  Increase Physical Activity;Increase Strength and Stamina;Knowledge and understanding of Target Heart Rate Range (THRR)     Comments  Patient is doing well in CR and has increased his speed on the treadmill to 2.0. Patient has also maintained high SPMs on the nustep and his level. Patinet states that he has not felt much improvement yet although it is only his first 10 sessions. Patient has been to the cardiologist about his high blood pressure.   Patient is doing well in CR and has progressed both on the treadmill speed to 2.2 and the Nustep level to level three. Patient states that he feels better overall but is still weak and is concerned about his BP being too high. Patient will be progressed overtime and will be monitored throughout the program.   Patient is doing well in CR and has increased his speed on the treadmill. Patient has also increased SPMs on the Nustep machine while maintaining his level. Patient's BP will continued to be monitored throughout the remainder of the program.      Expected Outcomes  Patient wishes to get better and to live a long life.   Patient wishes to get better and to live a long life.   Patient wishes to get better and to live a long life.          Discharge Exercise Prescription (Final Exercise Prescription Changes): Exercise Prescription Changes - 08/18/17 1200      Response to Exercise   Blood Pressure (Admit)  150/70    Blood Pressure  (Exercise)  172/82    Blood Pressure (Exit)  130/78    Heart Rate (Admit)  62 bpm    Heart Rate (Exercise)  115 bpm    Heart Rate (Exit)  71 bpm    Rating of Perceived Exertion (Exercise)  10    Duration  Progress to 30 minutes of  aerobic without signs/symptoms of physical distress    Intensity  THRR New [phone]      Progression   Progression  Continue to progress workloads to maintain intensity without signs/symptoms of physical distress.      Resistance Training   Training Prescription  Yes    Weight  4    Reps  10-15      Treadmill   MPH  2.6    Grade  0    Minutes  15    METs  2.9      NuStep   Level  3    SPM  158    Minutes  20    METs  2      Home Exercise Plan   Plans to continue exercise at  Home (comment)    Frequency  Add 2 additional days to program exercise sessions.    Initial Home Exercises Provided  06/17/17       Nutrition:  Target Goals: Understanding of nutrition guidelines, daily intake of sodium <1573m, cholesterol <2028m calories 30% from fat and 7% or less from saturated fats, daily to have 5 or more servings of fruits and vegetables.  Biometrics: Pre Biometrics - 06/11/17 0817      Pre Biometrics   Height  5' 9.5" (1.765 m)    Weight  190 lb 1.6 oz (86.2 kg)    Waist Circumference  40.5 inches    Hip Circumference  40 inches    Waist to Hip Ratio  1.01 %    BMI (Calculated)  27.68  Triceps Skinfold  12 mm    % Body Fat  26.8 %    Grip Strength  58.33 kg    Flexibility  0 in    Single Leg Stand  17 seconds      Post Biometrics - 09/07/17 1228       Post  Biometrics   Height  5' 9.5" (1.765 m)    Weight  185 lb 4.4 oz (84 kg)    Waist Circumference  39 inches    Hip Circumference  37 inches    Waist to Hip Ratio  1.05 %    BMI (Calculated)  26.98    Triceps Skinfold  12 mm    % Body Fat  25.8 %    Grip Strength  58.46 kg    Flexibility  0 in    Single Leg Stand  7 seconds       Nutrition Therapy Plan and Nutrition  Goals: Nutrition Therapy & Goals - 08/24/17 1457      Nutrition Therapy   RD appointment deferred  Yes      Personal Nutrition Goals   Personal Goal #2  Patient continues to eat baked meats, low sodium, low fat choices.     Additional Goals?  No      Intervention Plan   Intervention  Nutrition handout(s) given to patient.    Expected Outcomes  Short Term Goal: Understand basic principles of dietary content, such as calories, fat, sodium, cholesterol and nutrients.;Long Term Goal: Adherence to prescribed nutrition plan.       Nutrition Assessments: Nutrition Assessments - 09/07/17 1418      MEDFICTS Scores   Pre Score  38    Post Score  42    Score Difference  4       Nutrition Goals Re-Evaluation:   Nutrition Goals Discharge (Final Nutrition Goals Re-Evaluation):   Psychosocial: Target Goals: Acknowledge presence or absence of significant depression and/or stress, maximize coping skills, provide positive support system. Participant is able to verbalize types and ability to use techniques and skills needed for reducing stress and depression.  Initial Review & Psychosocial Screening: Initial Psych Review & Screening - 06/11/17 1343      Initial Review   Current issues with  None Identified      Family Dynamics   Good Support System?  Yes      Barriers   Psychosocial barriers to participate in program  There are no identifiable barriers or psychosocial needs.      Screening Interventions   Interventions  Encouraged to exercise    Expected Outcomes  Short Term goal: Identification and review with participant of any Quality of Life or Depression concerns found by scoring the questionnaire.;Long Term goal: The participant improves quality of Life and PHQ9 Scores as seen by post scores and/or verbalization of changes       Quality of Life Scores: Quality of Life - 09/07/17 1229      Quality of Life Scores   Health/Function Pre  24.54 %    Health/Function Post  23.8  %    Health/Function % Change  -3.02 %    Socioeconomic Pre  29.38 %    Socioeconomic Post  27.92 %    Socioeconomic % Change   -4.97 %    Psych/Spiritual Pre  30 %    Psych/Spiritual Post  27.43 %    Psych/Spiritual % Change  -8.57 %    Family Pre  27.6 %  Family Post  27.6 %    Family % Change  0 %    GLOBAL Pre  27.33 %    GLOBAL Post  25.89 %    GLOBAL % Change  -5.27 %      Scores of 19 and below usually indicate a poorer quality of life in these areas.  A difference of  2-3 points is a clinically meaningful difference.  A difference of 2-3 points in the total score of the Quality of Life Index has been associated with significant improvement in overall quality of life, self-image, physical symptoms, and general health in studies assessing change in quality of life.  PHQ-9: Recent Review Flowsheet Data    Depression screen [facility] 2/9 09/07/2017 06/11/2017   Decreased Interest 0 0   Down, Depressed, Hopeless 0 0   PHQ - 2 Score 0 0   Altered sleeping 3 2   Tired, decreased energy 3 0   Change in appetite 0 0   Feeling bad or failure about yourself  0 0   Trouble concentrating 0 0   Moving slowly or fidgety/restless 0 0   Suicidal thoughts 0 0   PHQ-9 Score 6 2   Difficult doing work/chores - Somewhat difficult     Interpretation of Total Score  Total Score Depression Severity:  1-4 = Minimal depression, 5-9 = Mild depression, 10-14 = Moderate depression, 15-19 = Moderately severe depression, 20-27 = Severe depression   Psychosocial Evaluation and Intervention: Psychosocial Evaluation - 09/07/17 1435      Discharge Psychosocial Assessment & Intervention   Comments  Patient has no psychosocial issues identified at discharge. His exit QOL score was lower at 25.89% decreasing by 5.27% and his PHQ-9 score also increased with exit score of 6 scoring in sleeping concerns and lack of energy.        Psychosocial Re-Evaluation: Psychosocial Re-Evaluation    Row Name 07/08/17  1450 07/27/17 1448 08/24/17 1500         Psychosocial Re-Evaluation   Current issues with  None Identified  None Identified  None Identified     Comments  Patient initial QOL was 27.33 and his PHQ-9 score was 2.   -  Patient initial QOL was 27.33 and his PHQ-9 score was 2.      Expected Outcomes  Patient will have no psychosocial issues identified at discharge.   Patient will have no psychosocial issues identified at discharge.   Patient will have no psychosocial issues identified at discharge.      Interventions  Encouraged to attend Cardiac Rehabilitation for the exercise;Stress management education;Relaxation education  Encouraged to attend Cardiac Rehabilitation for the exercise;Stress management education;Relaxation education  Encouraged to attend Cardiac Rehabilitation for the exercise;Stress management education;Relaxation education     Continue Psychosocial Services   No Follow up required  No Follow up required  No Follow up required        Psychosocial Discharge (Final Psychosocial Re-Evaluation): Psychosocial Re-Evaluation - 08/24/17 1500      Psychosocial Re-Evaluation   Current issues with  None Identified    Comments  Patient initial QOL was 27.33 and his PHQ-9 score was 2.     Expected Outcomes  Patient will have no psychosocial issues identified at discharge.     Interventions  Encouraged to attend Cardiac Rehabilitation for the exercise;Stress management education;Relaxation education    Continue Psychosocial Services   No Follow up required       Vocational Rehabilitation: Provide vocational rehab assistance to  qualifying candidates.   Vocational Rehab Evaluation & Intervention: Vocational Rehab - 06/11/17 1359      Initial Vocational Rehab Evaluation & Intervention   Assessment shows need for Vocational Rehabilitation  No Still works part-time       Education: Education Goals: Education classes will be provided on a weekly basis, covering required topics.  Participant will state understanding/return demonstration of topics presented.  Learning Barriers/Preferences: Learning Barriers/Preferences - 06/11/17 1358      Learning Barriers/Preferences   Learning Barriers  None    Learning Preferences  Group Instruction;Individual Instruction;Pictoral;Skilled Demonstration;Verbal Instruction;Video;Written Material;Computer/Internet;Audio       Education Topics: Hypertension, Hypertension Reduction -Define heart disease and high blood pressure. Discus how high blood pressure affects the body and ways to reduce high blood pressure.   CARDIAC REHAB PHASE II EXERCISE from 09/02/2017 in [place]  Date  07/01/17  Educator  DC  Instruction Review Code  2- Demonstrated Understanding      Exercise and Your Heart -Discuss why it is important to exercise, the FITT principles of exercise, normal and abnormal responses to exercise, and how to exercise safely.   Angina -Discuss definition of angina, causes of angina, treatment of angina, and how to decrease risk of having angina.   CARDIAC REHAB PHASE II EXERCISE from 09/02/2017 in [place]  Date  07/15/17  Educator  DJ  Instruction Review Code  2- Demonstrated Understanding      Cardiac Medications -Review what the following cardiac medications are used for, how they affect the body, and side effects that may occur when taking the medications.  Medications include Aspirin, Beta blockers, calcium channel blockers, ACE Inhibitors, angiotensin receptor blockers, diuretics, digoxin, and antihyperlipidemics.   CARDIAC REHAB PHASE II EXERCISE from 09/02/2017 in [place]  Date  07/22/17  Educator  DC  Instruction Review Code  2- Demonstrated Understanding      Congestive Heart Failure -Discuss the definition of CHF, how to live with CHF, the signs and symptoms of CHF, and how keep track of weight and sodium intake.   CARDIAC REHAB  PHASE II EXERCISE from 09/02/2017 in [place]  Date  07/29/17  Educator  DC  Instruction Review Code  2- Demonstrated Understanding      Heart Disease and Intimacy -Discus the effect sexual activity has on the heart, how changes occur during intimacy as we age, and safety during sexual activity.   CARDIAC REHAB PHASE II EXERCISE from 09/02/2017 in [place]  Date  08/05/17  Educator  DJ  Instruction Review Code  2- Demonstrated Understanding      Smoking Cessation / COPD -Discuss different methods to quit smoking, the health benefits of quitting smoking, and the definition of COPD.   CARDIAC REHAB PHASE II EXERCISE from 09/02/2017 in [place]  Date  08/13/17  Educator  DC  Instruction Review Code  2- Demonstrated Understanding      Nutrition I: Fats -Discuss the types of cholesterol, what cholesterol does to the heart, and how cholesterol levels can be controlled.   CARDIAC REHAB PHASE II EXERCISE from 09/02/2017 in [place]  Date  08/19/17  Educator  DC  Instruction Review Code  2- Demonstrated Understanding      Nutrition II: Labels -Discuss the different components of food labels and how to read food label   [place] from 09/02/2017 in [place]  Date  08/26/17  Educator  DJ  Instruction Review Code  2- Demonstrated Understanding      Heart Parts/Heart Disease and PAD -Discuss the anatomy of the heart, the pathway of blood circulation through the heart, and these are affected by heart disease.   CARDIAC REHAB PHASE II EXERCISE from 09/02/2017 in [place]  Date  09/02/17  Educator  DJ  Instruction Review Code  2- Demonstrated Understanding      Stress I: Signs and Symptoms -Discuss the causes of stress, how stress may lead to anxiety and depression, and ways to limit stress.   Stress II:  Relaxation -Discuss different types of relaxation techniques to limit stress.   CARDIAC REHAB PHASE II EXERCISE from 09/02/2017 in [place]  Date  06/17/17  Educator  DC  Instruction Review Code  2- Demonstrated Understanding      Warning Signs of Stroke / TIA -Discuss definition of a stroke, what the signs and symptoms are of a stroke, and how to identify when someone is having stroke.   CARDIAC REHAB PHASE II EXERCISE from 09/02/2017 in [place]  Date  06/24/17  Educator  DC  Instruction Review Code  2- Demonstrated Understanding      Knowledge Questionnaire Score: Knowledge Questionnaire Score - 09/07/17 1417      Knowledge Questionnaire Score   Pre Score  23/24    Post Score  23/24       Core Components/Risk Factors/Patient Goals at Admission: Personal Goals and Risk Factors at Admission - 06/11/17 1406      Core Components/Risk Factors/Patient Goals on Admission    Weight Management  Weight Maintenance    Personal Goal Other  Yes    Personal Goal  Get better, Live longer and healthier    Intervention  Attend CR 3xweek and supplement with home exercise 2 x week.     Expected Outcomes  Achieve personal goals       Core Components/Risk Factors/Patient Goals Review:  Goals and Risk Factor Review    Row Name 07/08/17 1447 07/27/17 1446 08/24/17 1458 09/07/17 1420       Core Components/Risk Factors/Patient Goals Review   Personal Goals Review  Weight Management/Obesity Get better; live longer and healthier.   Weight Management/Obesity Get better; live longer and healthier.   Weight Management/Obesity Get better; live longer and healthier.   Weight Management/Obesity Get better; live longer and healthier.     Review  Patient  has completed 10 sessions losing 2 lbs. He is doing well in the program with some progression. His has been hypertensive since he started. He say his cardiologist today 2/13 and h e added Lisinopril 5 mg  daily. He says he is feeling somewhat stronger but has not really noticed a big difference yet. Will continue to monitor for progress.   Patient has completed 18 sessions losing 1 lb. He continues to do well in the program with progression. He says he feels better but still feels weak. His cardiologist has adjusted his antihypertensive medications. His blood pressure readings have improved but continue to be hypertensive. Will continue to monitor.  Patient has completed 30 sessions losing 4 lbs overall. He continues to do well in the program with progression. He says he feels stronger but still feel weak in the morning. I advised him to speak with his physician about this. He was able to work out in his yard recently without difficulty or fatigue and he was pleased about this.  He is still hypertensive. We contacted Dr. Bronson Ing and he increased his Lisonopril. We are still monitoring.  Patient graduated with 36 sessions losing 5.2 lbs overall. He did well in the program. His exit measurements improved in grip strength and balance. His exit walk test improved by 4.1%. His medifcts score did not improve at a 4% increase. Patient says he is eating healthier and is pleased with his weight loss. He says the program has helped him feel stronger and have more stamina. His hypertension has improved averaging initial b/p at 135/70. Patient thinks his readings should be lower. He has an appointment with his cardiologist in May and I advised him to discuss this with him. Patient has purchased a treadmill and plans to continue exercising at home.     Expected Outcomes  Patient will continue to attend sessions and complete the program meeting his personal goals.   Patient will continue to attend sessions and complete the program meeting his personal goals.   Patient will continue to attend sessions and complete the program meeting his personal goals.   Patient will continue exercsing at home on his treadmill and continue to  meet his personal goals. CR will f/u for one year.        Core Components/Risk Factors/Patient Goals at Discharge (Final Review):  Goals and Risk Factor Review - 09/07/17 1420      Core Components/Risk Factors/Patient Goals Review   Personal Goals Review  Weight Management/Obesity Get better; live longer and healthier.     Review  Patient graduated with 36 sessions losing 5.2 lbs overall. He did well in the program. His exit measurements improved in grip strength and balance. His exit walk test improved by 4.1%. His medifcts score did not improve at a 4% increase. Patient says he is eating healthier and is pleased with his weight loss. He says the program has helped him feel stronger and have more stamina. His hypertension has improved averaging initial b/p at 135/70. Patient thinks his readings should be lower. He has an appointment with his cardiologist in May and I advised him to discuss this with him. Patient has purchased a treadmill and plans to continue exercising at home.     Expected Outcomes  Patient will continue exercsing at home on his treadmill and continue to meet his personal goals. CR will f/u for one year.        ITP Comments: ITP Comments    Row Name 06/11/17 1400 06/11/17 1528         ITP Comments  Mr. Doyle is tarting our CR program on Monday 06/15/17 at 8:15 on Monday and Wednesday and at 3:45 on Friday. 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 It usually goes away after resting.  Patient is new to the program. He plans to start Monday 06/15/2017.         Comments: Patient graduated from [facility] today on 09/07/17 after completing 36 sessions. He achieved LTG of 30 minutes of aerobic exercise at Max Met level of 3.0. All patients vitals are WNL. Patient has met with dietician. Discharge instruction has been reviewed in detail and patient stated an understanding of material given. Patient plans to continue exercising  at home on his Treadmill and by walking. Cardiac Rehab staff will make f/u calls at 1 month, 6 months, and 1 year. Patient had no complaints of any abnormal S/S or pain on their exit visit.

## 2017-09-07 NOTE — Progress Notes (Signed)
Daily Session Note  Patient Details  Name: Kyle Patel. MRN: [id number] Date of Birth: 1946/04/18 Referring Provider:     CARDIAC REHAB PHASE II ORIENTATION from 06/11/2017 in [place]  Referring Provider  Dr. Irish Lack      Encounter Date: 09/07/2017  Check In: Session Check In - 09/07/17 0815      Check-In   Location  [facility]    Staff Present  Aundra Dubin, RN, BSN;Gregory Luther Parody, BS, EP, Exercise Physiologist    Supervising physician immediately available to respond to emergencies  See telemetry face sheet for immediately available MD    Medication changes reported      No    Fall or balance concerns reported     No    Warm-up and Cool-down  Performed as group-led instruction    Resistance Training Performed  Yes    VAD Patient?  No      Pain Assessment   Currently in Pain?  No/denies    Pain Score  0-No pain    Multiple Pain Sites  No       Capillary Blood Glucose: No results found for this or any previous visit (from the past 24 hour(s)).    Social History   Tobacco Use  Smoking Status Former Smoker  . Packs/day: 0.10  . Years: 20.00  . Pack years: 2.00  . Types: Cigarettes  . Last attempt to quit: 04/25/2004  . Years since quitting: 13.3  Smokeless Tobacco Never Used    Goals Met:  Independence with exercise equipment Exercise tolerated well No report of cardiac concerns or symptoms Strength training completed today  Goals Unmet:  Not Applicable  Comments: Check out 915.   Dr. Kate Sable is Medical Director for [facility] Cardiac [facility] Pul[facility].

## 2017-09-09 ENCOUNTER — Encounter (HOSPITAL_COMMUNITY): Payer: Medicare Other

## 2017-10-07 ENCOUNTER — Ambulatory Visit: Payer: Medicare Other | Admitting: Cardiovascular Disease

## 2017-10-07 ENCOUNTER — Encounter: Payer: Self-pay | Admitting: *Deleted

## 2017-10-07 VITALS — BP 160/78 | HR 59 | Ht 69.5 in | Wt 187.8 lb

## 2017-10-07 DIAGNOSIS — Z79899 Other long term (current) drug therapy: Secondary | ICD-10-CM

## 2017-10-07 DIAGNOSIS — I25118 Atherosclerotic heart disease of native coronary artery with other forms of angina pectoris: Secondary | ICD-10-CM | POA: Diagnosis not present

## 2017-10-07 DIAGNOSIS — E785 Hyperlipidemia, unspecified: Secondary | ICD-10-CM

## 2017-10-07 DIAGNOSIS — I10 Essential (primary) hypertension: Secondary | ICD-10-CM | POA: Diagnosis not present

## 2017-10-07 DIAGNOSIS — I252 Old myocardial infarction: Secondary | ICD-10-CM

## 2017-10-07 MED ORDER — LISINOPRIL 20 MG PO TABS: 20.0000 mg | ORAL_TABLET | Freq: Every day | ORAL | 3 refills | Status: DC

## 2017-10-07 NOTE — Progress Notes (Signed)
SUBJECTIVE: The patient presents for routine follow up.  He has a history of non-STEMI and underwent coronary angiography on 04/24/17.  This demonstrated a distal 100% LAD stenosis.  There was a wire dissection at the lesion due to severe tortuosity.  Serial balloon inflations were performed which restored TIMI II flow and symptoms of chest pain improved.  A stent could not be delivered.  There was no significant remaining epicardial coronary disease.  Troponins peaked at 10.82.  Echocardiogram on 04/24/17 demonstrated normal left ventricular systolic function, LVEF 63-78%, apical dyskinesis, mild LVH, and grade 1 diastolic dysfunction.  He underwent normal ABIs bilaterally on 01/01/12.  Lipids 07/21/17: TC 147, HDL 49, TG 83, LDL 81.  He denies chest pain and shortness of breath. He has some mild joint pains in his elbows but these have decreased since switching from Lipitor to Crestor.  BP has been fluctuating. Yesterday it was 140/60 when checked by his sister who is a Marine scientist.  He exercises on the treadmill 20 minutes daily.   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   Review of Systems: As per "subjective", otherwise negative.  Allergies  Allergen Reactions  . Levaquin [Levofloxacin] Itching    Current Outpatient Medications  Medication Sig Dispense Refill  . aspirin 81 MG EC tablet Take 81 mg by mouth daily.    . cholecalciferol (VITAMIN D) 1000 UNITS tablet Take 1,000 Units by mouth daily.    . Coenzyme Q10-Vitamin E (QUNOL ULTRA COQ10) 100-150 MG-UNIT CAPS Take 1 capsule by mouth at bedtime.    Marland Kitchen lisinopril (PRINIVIL,ZESTRIL) 10 MG tablet Take 1 tablet (10 mg total) by mouth daily. 90 tablet 3  . LYRICA 75 MG capsule Take 75 mg by mouth every evening.    . metoprolol succinate (TOPROL-XL) 25 MG 24 hr tablet Take 1 tablet (25 mg total) by mouth daily. 90 tablet 1  . Misc Natural Products (OSTEO  BI-FLEX ADV JOINT SHIELD) TABS Take 1 tablet by mouth daily.    . Multiple Vitamins-Minerals (CENTRUM PO) Take 1 tablet by mouth every morning.    . nitroGLYCERIN (NITROSTAT) 0.4 MG SL tablet Place 1 tablet (0.4 mg total) under the tongue every 5 (five) minutes x 3 doses as needed for chest pain. 25 tablet 2  . rosuvastatin (CRESTOR) 20 MG tablet Take 1 tablet (20 mg total) by mouth daily. 90 tablet 3  . ticagrelor (BRILINTA) 90 MG TABS tablet Take 1 tablet (90 mg total) by mouth 2 (two) times daily. 180 tablet 2  . trolamine salicylate (ASPERCREME) 10 % cream Apply 1 application topically at bedtime as needed (For arthritis pain in knee).     No current facility-administered medications for this visit.     Past Medical History:  Diagnosis Date  . Arthritis   . 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 COPD (chronic obstructive pulmonary disease) ([place])   . 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 Hypercholesteremia   . Hypertension   . Pulmonary embolism ([place]) 10/2011    Past Surgical History:  Procedure Laterality Date  . ABDOMINAL SURGERY     colon polyp removed 2007- sepsis, coma for 4 months per pt  . COLONOSCOPY  03/22/2010   RMR: 1. Normal rectum 2. Diminutive polyp at the mouth of the ileocecal valve  status post cold biopsy removal. Remaideer of the colonic mucosa and terminal  ileum mucosa appeared normal.   . COLONOSCOPY N/A 06/25/2015   Procedure: COLONOSCOPY;  Surgeon: Daneil Dolin, MD;  Location: [facility];  Service: Endoscopy;  Laterality: N/A;  200-rescheduled 1/30 per [place]   . CORONARY BALLOON ANGIOPLASTY N/A 04/24/2017   Procedure: CORONARY BALLOON ANGIOPLASTY;  Surgeon: Jettie Booze, MD;  Location: [place] CV LAB;  Service: Cardiovascular;  Laterality: N/A;  . LEFT HEART CATH AND CORONARY ANGIOGRAPHY N/A 04/24/2017   Procedure: LEFT HEART CATH AND CORONARY ANGIOGRAPHY;  Surgeon: Jettie Booze, MD;  Location: [place] [facility];  Service: Cardiovascular;  Laterality: N/A;  . LUMBAR LAMINECTOMY  06/02/2012  . LUMBAR LAMINECTOMY/DECOMPRESSION MICRODISCECTOMY  06/02/2012   Procedure: LUMBAR LAMINECTOMY/DECOMPRESSION MICRODISCECTOMY 1 LEVEL;  Surgeon: Floyce Stakes, MD;  Location: [place] [facility]UR[facility] OR[facility];  Service: Neurosurgery;  Laterality: Bilateral;  Bilateral Lumbar five-sacral one Foraminotomy and microdiskectomy     Social History   Socioeconomic History  . Marital status: Married    Spouse name: Not on file  . Number of children: 1  . Years of education: Not on file  . Highest education level: Not on file  Occupational History  . Occupation: retired  Scientific laboratory technician  . Financial resource strain: Not on file  . Food insecurity:    Worry: Not on file    Inability: Not on file  . Transportation needs:    Medical: Not on file    Non-medical: Not on file  Tobacco Use  . Smoking status: Former Smoker    Packs/day: 0.10    Years: 20.00    Pack years: 2.00    Types: Cigarettes    Last attempt to quit: 04/25/2004    Years since quitting: 13.4  . Smokeless tobacco: Never Used  Substance and Sexual Activity  . Alcohol use: No  . Drug use: No  . Sexual activity: Not on file  Lifestyle  . Physical activity:    Days per week: Not on file    Minutes per session: Not on file  . Stress: Not on file  Relationships  . Social connections:    Talks on phone: Not on file    Gets together: Not on file    Attends religious service: Not on file    Active member of club or organization: Not on file    Attends meetings of clubs or organizations: Not on file    Relationship status: Not on file  . Intimate partner violence:    Fear of current or ex partner: Not on file    Emotionally abused: Not on file    Physically abused: Not on file    Forced sexual activity: Not on file  Other Topics Concern  . Not on file  Social History Narrative   Patient is right handed.   Patient drinks caffeine occasionally      Vitals:   10/07/17 0825  BP: (!) 160/78  Pulse: (!) 59  SpO2: 97%  Weight: 187 lb 12.8 oz (85.2 kg)  Height: 5' 9.5" (1.765 m)    Wt Readings from Last 3 Encounters:  10/07/17 187 lb 12.8 oz (85.2 kg)  09/07/17 185 lb 4.4 oz (84 kg)  07/08/17 188 lb 9.6 oz (85.5 kg)     PHYSICAL EXAM General: NAD HEENT: Normal. Neck: No JVD, no thyromegaly. Lungs: Clear to auscultation bilaterally with normal respiratory effort. CV: Regular rate and rhythm, normal S1/S2, no S3/S4, no murmur. No pretibial or periankle edema.  No carotid bruit.   Abdomen:  Soft, nontender, no distention.  Neurologic: Alert and oriented.  Psych: Normal affect. Skin: Normal. Musculoskeletal: No gross deformities.    ECG: Most recent ECG reviewed.   Labs: Lab Results  Component Value Date/Time   K 4.4 07/21/2017 08:10 AM   BUN 18 07/21/2017 08:10 AM   BUN 13 05/12/2017 03:28 PM   CREATININE 1.46 (H) 07/21/2017 08:10 AM   ALT 26 04/24/2017 04:02 AM   HGB 11.6 (L) 04/27/2017 05:48 AM     Lipids: Lab Results  Component Value Date/Time   LDLCALC 81 07/21/2017 08:10 AM   CHOL 147 07/21/2017 08:10 AM   TRIG 83 07/21/2017 08:10 AM   HDL 49 07/21/2017 08:10 AM       ASSESSMENT AND PLAN:  1.  Coronary artery disease with non-STEMI status post balloon angioplasty of distal LAD November 2018: Continue dual antiplatelet therapy for minimum of 1 year with aspirin and Brilinta.  Continue additional medical therapy with metoprolol, lisinopril, and Crestor.   2.  Chronic kidney disease stage III: This appears to be stable.  BUN 18, Cr 1.46 on 07/21/17.  3.  Hyperlipidemia: Most recent lipids reviewed above.  LDL good but not at goal.  Continue Crestor 20 mg.  I will repeat lipids.  4.  Hypertension: Blood pressure is elevated. I will increase lisinopril to 20 mg daily.     Disposition: Follow up 6 months   Kate Sable, M.D., F.A.C.C.

## 2017-10-07 NOTE — Patient Instructions (Signed)
Medication Instructions:  INCREASE LISINOPRIL TO 20 MG DAILY   Labwork: LIPID   Testing/Procedures: NONE  Follow-Up: Your physician wants you to follow-up in: 6 MONTHS .  You will receive a reminder letter in the mail two months in advance. If you don't receive a letter, please call our office to schedule the follow-up appointment.   Any Other Special Instructions Will Be Listed Below (If Applicable).     If you need a refill on your cardiac medications before your next appointment, please call your pharmacy.

## 2017-10-13 ENCOUNTER — Other Ambulatory Visit (HOSPITAL_COMMUNITY)
Admission: RE | Admit: 2017-10-13 | Discharge: 2017-10-13 | Disposition: A | Discharge status: Home / Self Care | Payer: Medicare Other | Source: Ambulatory Visit | Attending: Cardiovascular Disease | Admitting: Cardiovascular Disease

## 2017-10-13 DIAGNOSIS — E785 Hyperlipidemia, unspecified: Secondary | ICD-10-CM | POA: Diagnosis present

## 2017-10-13 LAB — LIPID PANEL
Cholesterol: 144 mg/dL (ref 0–200)
HDL: 53 mg/dL (ref >40)
LDL CALC: 77 mg/dL (ref 0–99)
Total CHOL/HDL Ratio: 2.7 RATIO
Triglycerides: 71 mg/dL (ref <150)
VLDL: 14 mg/dL (ref 0–40)

## 2017-10-23 ENCOUNTER — Other Ambulatory Visit: Payer: Self-pay | Admitting: Cardiology

## 2017-10-24 LAB — GLUCOSE, POCT (MANUAL RESULT ENTRY): POC Glucose: 139 mg/dl — AB (ref 70–99)

## 2018-01-22 ENCOUNTER — Other Ambulatory Visit: Payer: Self-pay | Admitting: Cardiology

## 2018-01-22 NOTE — Telephone Encounter (Signed)
This is Dr. Koneswaran's pt. °

## 2018-04-05 ENCOUNTER — Other Ambulatory Visit: Payer: Self-pay | Admitting: Cardiovascular Disease

## 2018-04-30 ENCOUNTER — Ambulatory Visit: Payer: Medicare Other | Admitting: Cardiovascular Disease

## 2018-04-30 ENCOUNTER — Encounter: Payer: Self-pay | Admitting: Cardiovascular Disease

## 2018-04-30 VITALS — BP 148/74 | HR 71 | Ht 69.5 in | Wt 190.0 lb

## 2018-04-30 DIAGNOSIS — R252 Cramp and spasm: Secondary | ICD-10-CM

## 2018-04-30 DIAGNOSIS — Z79899 Other long term (current) drug therapy: Secondary | ICD-10-CM

## 2018-04-30 DIAGNOSIS — I10 Essential (primary) hypertension: Secondary | ICD-10-CM | POA: Diagnosis not present

## 2018-04-30 DIAGNOSIS — N183 Chronic kidney disease, stage 3 unspecified: Secondary | ICD-10-CM

## 2018-04-30 DIAGNOSIS — I25118 Atherosclerotic heart disease of native coronary artery with other forms of angina pectoris: Secondary | ICD-10-CM

## 2018-04-30 DIAGNOSIS — I252 Old myocardial infarction: Secondary | ICD-10-CM

## 2018-04-30 DIAGNOSIS — E782 Mixed hyperlipidemia: Secondary | ICD-10-CM

## 2018-04-30 MED ORDER — LISINOPRIL-HYDROCHLOROTHIAZIDE 20-12.5 MG PO TABS: 1.0000 | ORAL_TABLET | Freq: Every day | ORAL | 3 refills | Status: DC

## 2018-04-30 MED ORDER — ROSUVASTATIN CALCIUM 20 MG PO TABS: 20.0000 mg | ORAL_TABLET | ORAL | 3 refills | Status: DC

## 2018-04-30 NOTE — Patient Instructions (Signed)
Medication Instructions:  STOP Brilinta  Take Crestor Every OTHER Day  STOP Lisinopril  Take Zestoretic 20/12.5 mg daily   If you need a refill on your cardiac medications before your next appointment, please call your pharmacy.   Lab work: Probation officer and Magnesium in 5 days  05/06/18  Get Fasting Lipids in 3 months (end of March)  If you have labs (blood work) drawn today and your tests are completely normal, you will receive your results only by: Marland Kitchen MyChart Message (if you have MyChart) OR . A paper copy in the mail If you have any lab test that is abnormal or we need to change your treatment, we will call you to review the results.  Testing/Procedures: None  Follow-Up: At [facility], you and your health needs are our priority.  As part of our continuing mission to provide you with exceptional heart care, we have created designated Provider Care Teams.  These Care Teams include your primary Cardiologist (physician) and Advanced Practice Providers (APPs -  Physician Assistants and Nurse Practitioners) who all work together to provide you with the care you need, when you need it. You will need a follow up appointment in 6 months.  Please call our office 2 months in advance to schedule this appointment.  You may see Kate Sable, MD or one of the following Advanced Practice Providers on your designated Care Team:   Bernerd Pho, PA-C [facility]) . Ermalinda Barrios, PA-C ([place])  Any Other Special Instructions Will Be Listed Below (If Applicable). None

## 2018-04-30 NOTE — Progress Notes (Signed)
SUBJECTIVE: The patient presents for routine follow up.  He has a history of non-STEMI and underwent coronary angiography on 04/24/17. This demonstrated a distal 100% LAD stenosis. There was a wire dissection at the lesion due to severe tortuosity. Serial balloon inflations were performed which restored TIMI II flow and symptoms of chest pain improved. A stent could not be delivered. There was no significant remaining epicardial coronary disease. Troponins peaked at 10.82.  Echocardiogram on 04/24/17 demonstrated normal left ventricular systolic function, LVEF 56-81%, apical dyskinesis, mild LVH, and grade 1 diastolic dysfunction.  He underwent normal ABIs bilaterally on 01/01/12.  He has several complaints today.  He said he has joint pains in his elbows and knees.  He also describes bilateral calf cramps.  He said his hands swell every morning and he has occasional bilateral feet swelling.  He avoids the treadmill because of the feet swelling.  He has occasional upper left-sided chest pains lasting seconds.  He denies exertional chest pain.  He is able to climb 15 stairs at home without difficulty.  He seldom has some fatigue when he gets to the top.  Blood pressure is elevated today, 148/74.  ECG performed in the office today which I ordered and personally interpreted demonstrates normal sinus rhythm with right bundle branch block, left anterior fascicular block, and old anterior infarct.   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  Review of Systems: As per "subjective", otherwise negative.  Allergies  Allergen Reactions  . Levaquin [Levofloxacin] Itching    Current Outpatient Medications  Medication Sig Dispense Refill  . aspirin 81 MG EC tablet Take 81 mg by mouth daily.    Marland Kitchen BRILINTA 90 MG TABS tablet TAKE (1) TABLET BY MOUTH TWICE DAILY. 60 tablet 11  . cholecalciferol (VITAMIN D) 1000 UNITS tablet Take  1,000 Units by mouth daily.    . Coenzyme Q10-Vitamin E (QUNOL ULTRA COQ10) 100-150 MG-UNIT CAPS Take 1 capsule by mouth at bedtime.    Marland Kitchen lisinopril (PRINIVIL,ZESTRIL) 20 MG tablet Take 1 tablet (20 mg total) by mouth daily. 90 tablet 3  . LYRICA 75 MG capsule Take 75 mg by mouth every evening.    . metoprolol succinate (TOPROL-XL) 25 MG 24 hr tablet TAKE (1) TABLET BY MOUTH ONCE DAILY. 90 tablet 1  . Misc Natural Products (OSTEO BI-FLEX ADV JOINT SHIELD) TABS Take 1 tablet by mouth daily.    . Multiple Vitamins-Minerals (CENTRUM PO) Take 1 tablet by mouth every morning.    . nitroGLYCERIN (NITROSTAT) 0.4 MG SL tablet Place 1 tablet (0.4 mg total) under the tongue every 5 (five) minutes x 3 doses as needed for chest pain. 25 tablet 2  . rosuvastatin (CRESTOR) 20 MG tablet TAKE (1) TABLET BY MOUTH ONCE DAILY. 90 tablet 3  . trolamine salicylate (ASPERCREME) 10 % cream Apply 1 application topically at bedtime as needed (For arthritis pain in knee).     No current facility-administered medications for this visit.     Past Medical History:  Diagnosis Date  . Arthritis   . 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 COPD (chronic obstructive pulmonary disease) ([place])   . 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 Hypercholesteremia   . Hypertension   . Pulmonary embolism ([place]) 10/2011    Past Surgical History:  Procedure Laterality Date  . ABDOMINAL SURGERY     colon polyp removed 2007-  sepsis, coma for 4 months per pt  . COLONOSCOPY  03/22/2010   RMR: 1. Normal rectum 2. Diminutive polyp at the mouth of the ileocecal valve  status post cold biopsy removal. Remaideer of the colonic mucosa and terminal ileum mucosa appeared normal.   . COLONOSCOPY N/A 06/25/2015   Procedure: COLONOSCOPY;  Surgeon: Daneil Dolin, MD;  [facility];  Service: Endoscopy;  Laterality: N/A;  200-rescheduled 1/30 per [facility]   . CORONARY BALLOON ANGIOPLASTY N/A 04/24/2017    Procedure: CORONARY BALLOON ANGIOPLASTY;  Surgeon: Jettie Booze, MD;  Location: [place] [facility];  Service: Cardiovascular;  Laterality: N/A;  . LEFT HEART CATH AND CORONARY ANGIOGRAPHY N/A 04/24/2017   Procedure: LEFT HEART CATH AND CORONARY ANGIOGRAPHY;  Surgeon: Jettie Booze, MD;  Location: [place] [facility];  Service: Cardiovascular;  Laterality: N/A;  . LUMBAR LAMINECTOMY  06/02/2012  . LUMBAR LAMINECTOMY/DECOMPRESSION MICRODISCECTOMY  06/02/2012   Procedure: LUMBAR LAMINECTOMY/DECOMPRESSION MICRODISCECTOMY 1 LEVEL;  Surgeon: Floyce Stakes, MD;  Location: [facility];  Service: Neurosurgery;  Laterality: Bilateral;  Bilateral Lumbar five-sacral one Foraminotomy and microdiskectomy     Social History   Socioeconomic History  . Marital status: Married    Spouse name: Not on file  . Number of children: 1  . Years of education: Not on file  . Highest education level: Not on file  Occupational History  . Occupation: retired  Scientific laboratory technician  . Financial resource strain: Not on file  . Food insecurity:    Worry: Not on file    Inability: Not on file  . Transportation needs:    Medical: Not on file    Non-medical: Not on file  Tobacco Use  . Smoking status: Former Smoker    Packs/day: 0.10    Years: 20.00    Pack years: 2.00    Types: Cigarettes    Last attempt to quit: 04/25/2004    Years since quitting: 14.0  . Smokeless tobacco: Never Used  Substance and Sexual Activity  . Alcohol use: No  . Drug use: No  . Sexual activity: Not on file  Lifestyle  . Physical activity:    Days per week: Not on file    Minutes per session: Not on file  . Stress: Not on file  Relationships  . Social connections:    Talks on phone: Not on file    Gets together: Not on file    Attends religious service: Not on file    Active member of club or organization: Not on file    Attends meetings of clubs or organizations: Not on file    Relationship status: Not on file  .  Intimate partner violence:    Fear of current or ex partner: Not on file    Emotionally abused: Not on file    Physically abused: Not on file    Forced sexual activity: Not on file  Other Topics Concern  . Not on file  Social History Narrative   Patient is right handed.   Patient drinks caffeine occasionally     Vitals:   04/30/18 0928  BP: (!) 148/74  Pulse: 71  SpO2: 96%  Weight: 190 lb (86.2 kg)  Height: 5' 9.5" (1.765 m)    Wt Readings from Last 3 Encounters:  04/30/18 190 lb (86.2 kg)  10/07/17 187 lb 12.8 oz (85.2 kg)  09/07/17 185 lb 4.4 oz (84 kg)     PHYSICAL EXAM General: NAD HEENT: Normal. Neck:  No JVD, no thyromegaly. Lungs: Clear to auscultation bilaterally with normal respiratory effort. CV: Regular rate and rhythm, normal S1/S2, no S3/S4, no murmur. No pretibial or periankle edema.  No carotid bruit.   Abdomen: Soft, nontender, no distention.  Neurologic: Alert and oriented.  Psych: Normal affect. Skin: Normal. Musculoskeletal: No gross deformities.    ECG: Reviewed above under Subjective   Labs: Lab Results  Component Value Date/Time   K 4.4 07/21/2017 08:10 AM   BUN 18 07/21/2017 08:10 AM   BUN 13 05/12/2017 03:28 PM   CREATININE 1.46 (H) 07/21/2017 08:10 AM   ALT 26 04/24/2017 04:02 AM   HGB 11.6 (L) 04/27/2017 05:48 AM     Lipids: Lab Results  Component Value Date/Time   LDLCALC 77 10/13/2017 08:17 AM   CHOL 144 10/13/2017 08:17 AM   TRIG 71 10/13/2017 08:17 AM   HDL 53 10/13/2017 08:17 AM       ASSESSMENT AND PLAN:  1. Coronary artery disease: Non-STEMI status post balloon angioplasty of distal LAD November 2018. Continue ASA, metoprolol, lisinopril, and Crestor (I will reduce frequency to 20 mg every other day due to diffuse joint pains).  I will discontinue Brilinta.  2. Chronic kidney disease stage III: This appears to be stable. BUN 18, Cr 1.46 on 07/21/17.  I will repeat a basic metabolic panel after antihypertensive  adjustments as noted below.  3. Hyperlipidemia: Most recent lipids reviewed above.  LDL good but not at goal.    Due to diffuse joint pains, I will reduce Crestor frequency to 20 mg every other day.  I will then repeat lipids in 3 months.  4. Hypertension: Blood pressure is elevated.  I will switch lisinopril to Zestoretic 20 mg - 12.5 mg.  I will then check a basic metabolic panel within a few days of initiation.  This should help with hands and feet swelling.  5.  Bilateral leg cramps:  Previously underwent normal ABIs as detailed above.  I will check a basic metabolic panel and magnesium level.   Disposition: Follow up 6 months   Kate Sable, M.D., F.A.C.C.

## 2018-05-06 ENCOUNTER — Other Ambulatory Visit (HOSPITAL_COMMUNITY)
Admission: RE | Admit: 2018-05-06 | Discharge: 2018-05-06 | Disposition: A | Discharge status: Home / Self Care | Payer: Medicare Other | Source: Ambulatory Visit | Attending: Cardiovascular Disease | Admitting: Cardiovascular Disease

## 2018-05-06 DIAGNOSIS — Z79899 Other long term (current) drug therapy: Secondary | ICD-10-CM | POA: Insufficient documentation

## 2018-05-06 LAB — MAGNESIUM: MAGNESIUM: 1.8 mg/dL (ref 1.7–2.4)

## 2018-05-06 LAB — BASIC METABOLIC PANEL
Anion gap: 7 (ref 5–15)
BUN: 24 mg/dL — ABNORMAL HIGH (ref 8–23)
CO2: 25 mmol/L (ref 22–32)
Calcium: 9.5 mg/dL (ref 8.9–10.3)
Chloride: 110 mmol/L (ref 98–111)
Creatinine, Ser: 1.5 mg/dL — ABNORMAL HIGH (ref 0.61–1.24)
GFR calc Af Amer: 54 mL/min — ABNORMAL LOW (ref >60)
GFR calc non Af Amer: 46 mL/min — ABNORMAL LOW (ref >60)
Glucose, Bld: 111 mg/dL — ABNORMAL HIGH (ref 70–99)
Potassium: 4.7 mmol/L (ref 3.5–5.1)
Sodium: 142 mmol/L (ref 135–145)

## 2018-05-10 ENCOUNTER — Telehealth: Payer: Self-pay | Admitting: *Deleted

## 2018-05-10 DIAGNOSIS — Z79899 Other long term (current) drug therapy: Secondary | ICD-10-CM

## 2018-05-10 DIAGNOSIS — I10 Essential (primary) hypertension: Secondary | ICD-10-CM

## 2018-05-10 NOTE — Telephone Encounter (Signed)
-----   Message from Herminio Commons, MD sent at 05/06/2018 11:33 AM EST ----- K and Mg levels are normal, although he could still consider taking OTC magnesium as it was on the low side of normal to help with cramps. Renal function is relatively stable from 9 months ago. Would repeat BMET in 1 month. Is his swelling improved?

## 2018-05-10 NOTE — Telephone Encounter (Signed)
Pt states swelling is better.

## 2018-05-14 ENCOUNTER — Other Ambulatory Visit (HOSPITAL_COMMUNITY)
Admission: RE | Admit: 2018-05-14 | Discharge: 2018-05-14 | Disposition: A | Discharge status: Home / Self Care | Payer: Medicare Other | Source: Ambulatory Visit | Attending: Pulmonary Disease | Admitting: Pulmonary Disease

## 2018-05-14 DIAGNOSIS — I10 Essential (primary) hypertension: Secondary | ICD-10-CM | POA: Insufficient documentation

## 2018-05-14 DIAGNOSIS — Z0000 Encounter for general adult medical examination without abnormal findings: Secondary | ICD-10-CM | POA: Diagnosis present

## 2018-05-14 DIAGNOSIS — Z125 Encounter for screening for malignant neoplasm of prostate: Secondary | ICD-10-CM | POA: Insufficient documentation

## 2018-05-14 DIAGNOSIS — J449 Chronic obstructive pulmonary disease, unspecified: Secondary | ICD-10-CM | POA: Diagnosis present

## 2018-05-14 DIAGNOSIS — D649 Anemia, unspecified: Secondary | ICD-10-CM | POA: Diagnosis present

## 2018-05-14 LAB — COMPREHENSIVE METABOLIC PANEL
ALT: 24 U/L (ref 0–44)
ANION GAP: 6 (ref 5–15)
AST: 34 U/L (ref 15–41)
Albumin: 3.9 g/dL (ref 3.5–5.0)
Alkaline Phosphatase: 56 U/L (ref 38–126)
BUN: 20 mg/dL (ref 8–23)
CO2: 23 mmol/L (ref 22–32)
Calcium: 8.9 mg/dL (ref 8.9–10.3)
Chloride: 108 mmol/L (ref 98–111)
Creatinine, Ser: 1.45 mg/dL — ABNORMAL HIGH (ref 0.61–1.24)
GFR calc non Af Amer: 48 mL/min — ABNORMAL LOW (ref >60)
GFR, EST AFRICAN AMERICAN: 56 mL/min — AB (ref >60)
Glucose, Bld: 102 mg/dL — ABNORMAL HIGH (ref 70–99)
POTASSIUM: 4.2 mmol/L (ref 3.5–5.1)
Sodium: 137 mmol/L (ref 135–145)
Total Bilirubin: 0.6 mg/dL (ref 0.3–1.2)
Total Protein: 7.6 g/dL (ref 6.5–8.1)

## 2018-05-14 LAB — LIPID PANEL
Cholesterol: 192 mg/dL (ref 0–200)
HDL: 62 mg/dL (ref >40)
LDL Cholesterol: 115 mg/dL — ABNORMAL HIGH (ref 0–99)
Total CHOL/HDL Ratio: 3.1 RATIO
Triglycerides: 74 mg/dL (ref <150)
VLDL: 15 mg/dL (ref 0–40)

## 2018-05-14 LAB — CBC
HCT: 43.1 % (ref 39.0–52.0)
Hemoglobin: 13.6 g/dL (ref 13.0–17.0)
MCH: 30.1 pg (ref 26.0–34.0)
MCHC: 31.6 g/dL (ref 30.0–36.0)
MCV: 95.4 fL (ref 80.0–100.0)
Platelets: 188 10*3/uL (ref 150–400)
RBC: 4.52 MIL/uL (ref 4.22–5.81)
RDW: 14.5 % (ref 11.5–15.5)
WBC: 6.9 10*3/uL (ref 4.0–10.5)
nRBC: 0 % (ref 0.0–0.2)

## 2018-05-14 LAB — PSA: Prostatic Specific Antigen: 2.23 ng/mL (ref 0.00–4.00)

## 2018-05-14 LAB — TSH: TSH: 2.56 u[IU]/mL (ref 0.350–4.500)

## 2018-10-08 ENCOUNTER — Other Ambulatory Visit: Payer: Self-pay | Admitting: Cardiology

## 2018-10-25 ENCOUNTER — Other Ambulatory Visit: Payer: Self-pay | Admitting: Cardiology

## 2018-11-10 ENCOUNTER — Other Ambulatory Visit (HOSPITAL_COMMUNITY): Payer: Self-pay | Admitting: Pulmonary Disease

## 2018-11-10 DIAGNOSIS — R609 Edema, unspecified: Secondary | ICD-10-CM

## 2018-11-16 ENCOUNTER — Other Ambulatory Visit (HOSPITAL_COMMUNITY): Payer: Medicare Other

## 2018-11-16 ENCOUNTER — Ambulatory Visit (HOSPITAL_COMMUNITY)
Admission: RE | Admit: 2018-11-16 | Discharge: 2018-11-16 | Disposition: A | Discharge status: Home / Self Care | Payer: Medicare Other | Source: Ambulatory Visit | Attending: Pulmonary Disease | Admitting: Pulmonary Disease

## 2018-11-16 ENCOUNTER — Other Ambulatory Visit: Payer: Self-pay

## 2018-11-16 DIAGNOSIS — I10 Essential (primary) hypertension: Secondary | ICD-10-CM | POA: Diagnosis not present

## 2018-11-16 DIAGNOSIS — J449 Chronic obstructive pulmonary disease, unspecified: Secondary | ICD-10-CM | POA: Insufficient documentation

## 2018-11-16 DIAGNOSIS — R609 Edema, unspecified: Secondary | ICD-10-CM | POA: Insufficient documentation

## 2018-11-16 DIAGNOSIS — E785 Hyperlipidemia, unspecified: Secondary | ICD-10-CM | POA: Insufficient documentation

## 2018-11-16 DIAGNOSIS — I252 Old myocardial infarction: Secondary | ICD-10-CM | POA: Insufficient documentation

## 2018-11-16 DIAGNOSIS — Z7901 Long term (current) use of anticoagulants: Secondary | ICD-10-CM | POA: Insufficient documentation

## 2018-11-16 DIAGNOSIS — Z86711 Personal history of pulmonary embolism: Secondary | ICD-10-CM | POA: Diagnosis not present

## 2018-11-16 NOTE — Progress Notes (Signed)
*  PRELIMINARY RESULTS* Echocardiogram 2D Echocardiogram has been performed.  Samuel Germany 11/16/2018, 4:09 PM

## 2019-01-22 ENCOUNTER — Other Ambulatory Visit: Payer: Self-pay | Admitting: Cardiology

## 2019-02-16 ENCOUNTER — Encounter: Payer: Self-pay | Admitting: Cardiovascular Disease

## 2019-02-16 ENCOUNTER — Ambulatory Visit (INDEPENDENT_AMBULATORY_CARE_PROVIDER_SITE_OTHER): Payer: Medicare Other | Admitting: Cardiovascular Disease

## 2019-02-16 ENCOUNTER — Other Ambulatory Visit: Payer: Self-pay

## 2019-02-16 VITALS — BP 154/77 | HR 79 | Temp 97.7°F | Ht 69.5 in | Wt 202.0 lb

## 2019-02-16 DIAGNOSIS — I25118 Atherosclerotic heart disease of native coronary artery with other forms of angina pectoris: Secondary | ICD-10-CM

## 2019-02-16 DIAGNOSIS — Z79899 Other long term (current) drug therapy: Secondary | ICD-10-CM | POA: Diagnosis not present

## 2019-02-16 DIAGNOSIS — I252 Old myocardial infarction: Secondary | ICD-10-CM

## 2019-02-16 DIAGNOSIS — Z789 Other specified health status: Secondary | ICD-10-CM | POA: Diagnosis not present

## 2019-02-16 DIAGNOSIS — N183 Chronic kidney disease, stage 3 unspecified: Secondary | ICD-10-CM

## 2019-02-16 DIAGNOSIS — I10 Essential (primary) hypertension: Secondary | ICD-10-CM

## 2019-02-16 DIAGNOSIS — E782 Mixed hyperlipidemia: Secondary | ICD-10-CM

## 2019-02-16 MED ORDER — LISINOPRIL-HYDROCHLOROTHIAZIDE 20-12.5 MG PO TABS: 1.0000 | ORAL_TABLET | Freq: Two times a day (BID) | ORAL | 3 refills | Status: DC

## 2019-02-16 NOTE — Patient Instructions (Addendum)
Medication Instructions:  STOP PRAVASTATIN   INCREASE ZEST ORETIC TO 20-12.5 MG - TWO TIMES DAILY   Labwork: NONE  Testing/Procedures: NONE  Follow-Up: Your physician wants you to follow-up in: 6 MONTHS.  You will receive a reminder letter in the mail two months in advance. If you don't receive a letter, please call our office to schedule the follow-up appointment.   Any Other Special Instructions Will Be Listed Below (If Applicable).  You have been referred to lipid clinic. They will contact you.    If you need a refill on your cardiac medications before your next appointment, please call your pharmacy.

## 2019-02-16 NOTE — Progress Notes (Signed)
SUBJECTIVE: The patient presents for routine follow up. He has a history of non-STEMI and underwent coronary angiography on 04/24/17. This demonstrated a distal 100% LAD stenosis. There was a wire dissection at the lesion due to severe tortuosity. Serial balloon inflations were performed which restored TIMI II flow and symptoms of chest pain improved. A stent could not be delivered. There was no significant remaining epicardial coronary disease. Troponins peaked at 10.82.  Echocardiogram on 04/24/17 demonstrated normal left ventricular systolic function, LVEF 0000000, apical dyskinesis, mild LVH, and grade 1 diastolic dysfunction.  He underwent normal ABIs bilaterally on 01/01/12.  His PCP switched him from Crestor 20 mg every other day to pravastatin 80 mg.  He continues to experience bilateral knee and elbow pain.  He then reduce it to 40 mg.  The patient continues to have joint pains.  He has had bilateral hand and feet swelling lately.  He also has bilateral feet neuropathy and has not been walking for the past 4 months.  His blood pressure has remained elevated.  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  Review of Systems: As per "subjective", otherwise negative.  Allergies  Allergen Reactions   Levaquin [Levofloxacin] Itching    Current Outpatient Medications  Medication Sig Dispense Refill   aspirin 81 MG EC tablet Take 81 mg by mouth daily.     cholecalciferol (VITAMIN D) 1000 UNITS tablet Take 1,000 Units by mouth daily.     Coenzyme Q10-Vitamin E (QUNOL ULTRA COQ10) 100-150 MG-UNIT CAPS Take 1 capsule by mouth at bedtime.     lisinopril-hydrochlorothiazide (ZESTORETIC) 20-12.5 MG tablet Take 1 tablet by mouth daily. 90 tablet 3   LYRICA 75 MG capsule Take 75 mg by mouth every evening.     metoprolol succinate (TOPROL-XL) 25 MG 24 hr tablet TAKE (1) TABLET BY MOUTH ONCE DAILY. 90 tablet 3     Misc Natural Products (OSTEO BI-FLEX ADV JOINT SHIELD) TABS Take 1 tablet by mouth daily.     Multiple Vitamins-Minerals (CENTRUM PO) Take 1 tablet by mouth every morning.     nitroGLYCERIN (NITROSTAT) 0.4 MG SL tablet PLACE 1 TAB UNDER TONGUE EVERY 5 MIN IF NEEDED FOR CHEST PAIN. MAY USE 3 TIMES.NO RELIEF CALL 911. 25 tablet 0   pravastatin (PRAVACHOL) 40 MG tablet      No current facility-administered medications for this visit.     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 Normal rectum 2. Diminutive polyp at the mouth of the ileocecal valve  status post cold biopsy removal. Remaideer of the colonic mucosa and terminal ileum mucosa appeared normal.    COLONOSCOPY N/A 06/25/2015   Procedure: COLONOSCOPY;  Surgeon: Daneil Dolin, MD;  [facility];  Service: Endoscopy;  Laterality: N/A;  200-rescheduled 1/30 per Ginger    CORONARY BALLOON ANGIOPLASTY N/A 04/24/2017   Procedure: CORONARY BALLOON ANGIOPLASTY;  Surgeon: Jettie Booze, MD;  Location: [place] [facility];  Service: Cardiovascular;  Laterality: N/A;   LEFT HEART CATH AND CORONARY ANGIOGRAPHY N/A 04/24/2017   Procedure: LEFT HEART CATH AND CORONARY ANGIOGRAPHY;  Surgeon: Jettie Booze, MD;  Location: [place] [facility];  Service: Cardiovascular;  Laterality: N/A;   LUMBAR LAMINECTOMY  06/02/2012   LUMBAR LAMINECTOMY/DECOMPRESSION MICRODISCECTOMY  06/02/2012   Procedure: LUMBAR LAMINECTOMY/DECOMPRESSION MICRODISCECTOMY 1 LEVEL;  Surgeon: Floyce Stakes, MD;  Location: [facility]  [facility];  Service: Neurosurgery;  Laterality: Bilateral;  Bilateral Lumbar five-sacral one Foraminotomy and microdiskectomy     Social History   Socioeconomic History   Marital status: Married    Spouse name: Not on file   Number of children: 1   Years of education: Not on file   Highest education level: Not on file  Occupational History   Occupation: retired  Scientist, product/process development strain: Not on file   Food insecurity    Worry: Not on file    Inability: Not on Lexicographer needs    Medical: Not on file    Non-medical: Not on file  Tobacco Use   Smoking status: Former Smoker    Packs/day: 0.10    Years: 20.00    Pack years: 2.00    Types: Cigarettes    Quit date: 04/25/2004    Years since quitting: 14.8   Smokeless tobacco: Never Used  Substance and Sexual Activity   Alcohol use: No   Drug use: No   Sexual activity: Not on file  Lifestyle   Physical activity    Days per week: Not on file    Minutes per session: Not on file   Stress: Not on file  Relationships   Social connections    Talks on phone: Not on file    Gets together: Not on file    Attends religious service: Not on file    Active member of club or organization: Not on file    Attends meetings of clubs or organizations: Not on file    Relationship status: Not on file   Intimate partner violence    Fear of current or ex partner: Not on file    Emotionally abused: Not on file    Physically abused: Not on file    Forced sexual activity: Not on file  Other Topics Concern   Not on file  Social History Narrative   Patient is right handed.   Patient drinks caffeine occasionally     Vitals:   02/16/19 1545  BP: (!) 154/77  Pulse: 79  Temp: 97.7 F (36.5 C)  Weight: 202 lb (91.6 kg)  Height: 5' 9.5" (1.765 m)    Wt Readings from Last 3 Encounters:  02/16/19 202 lb (91.6 kg)  04/30/18 190 lb (86.2 kg)  10/07/17 187 lb 12.8 oz (85.2 kg)     PHYSICAL  EXAM General: NAD HEENT: Normal. Neck: No JVD, no thyromegaly. Lungs: Clear to auscultation bilaterally with normal respiratory effort. CV: Regular rate and rhythm, normal S1/S2, no S3/S4, no murmur. No pretibial or periankle edema.  No carotid bruit.   Abdomen: Soft, nontender, no distention.  Neurologic: Alert and oriented.  Psych: Normal affect. Skin: Normal. Musculoskeletal: No gross deformities.      Labs: Lab Results  Component Value Date/Time   K 4.2 05/14/2018 08:33 AM   BUN 20 05/14/2018 08:33 AM   BUN 13 05/12/2017 03:28 PM   CREATININE 1.45 (H) 05/14/2018 08:33 AM   ALT 24 05/14/2018 08:33 AM  TSH 2.560 05/14/2018 09:27 AM   HGB 13.6 05/14/2018 09:23 AM     Lipids: Lab Results  Component Value Date/Time   LDLCALC 115 (H) 05/14/2018 09:24 AM   CHOL 192 05/14/2018 09:24 AM   TRIG 74 05/14/2018 09:24 AM   HDL 62 05/14/2018 09:24 AM       ASSESSMENT AND PLAN: 1. Coronary artery disease: Non-STEMI status post balloon angioplasty of distal LAD November 2018. Continue ASA, metoprolol and lisinopril. LDL was 115 on 05/14/2018.  He has not tolerated Crestor 20 mg every other day nor pravastatin at 80 mg or 40 mg.  I will discontinue statin therapy and refer him to the lipid clinic for consideration of a PCSK-9 inhibitor.  2. Chronic kidney disease stage III: This appears to be stable.Basic metabolic panel reviewed above.  3. Hyperlipidemia: Most recent lipids reviewed above.Now on pravastatin 40 mg.  LDL was 115 on 05/14/2018.  He has not tolerated Crestor 20 mg every other day nor pravastatin at 80 mg or 40 mg.  I will discontinue statin therapy and refer him to the lipid clinic for consideration of a PCSK-9 inhibitor.  4. Hypertension: Blood pressure is elevated.  I will increase Zestoretic to 20-12.5 mg twice daily.    Disposition: Follow up 6 months   Kate Sable, M.D., F.A.C.C.

## 2019-02-17 ENCOUNTER — Telehealth: Payer: Self-pay | Admitting: Cardiovascular Disease

## 2019-02-17 NOTE — Telephone Encounter (Signed)
Please give pt a call concerning the medication lisinopril-hydrochlorothiazide (ZESTORETIC) 20-12.5 MG tablet [id number]

## 2019-02-17 NOTE — Telephone Encounter (Signed)
Pt confused as he had lisinopril 40 mg at home already.I told him to stop that and take lisinopril-hctz as directed. He picked it up last night from [place]

## 2019-02-17 NOTE — Telephone Encounter (Signed)
[id number] 

## 2019-02-21 ENCOUNTER — Telehealth: Payer: Self-pay

## 2019-02-21 NOTE — Telephone Encounter (Signed)
Agree 

## 2019-02-21 NOTE — Telephone Encounter (Signed)
Pt came by the office at noon on Friday as we were leaving and wanted to know when his next colonoscopy is due. He said he was not having any problems, he just could not remember when next was due. I told him someone would call and let him know on Monday morning. Pt is aware his next is due February 2022 and that he will be sent a reminder.

## 2019-03-04 ENCOUNTER — Telehealth: Payer: Self-pay | Admitting: Cardiovascular Disease

## 2019-03-04 NOTE — Telephone Encounter (Signed)
Patient has apt with lipid clinic next month.He was told to stop statin as it didn't work, so I told him to not worry as he was meeting with the lipid clinic and they will decide what medication he should be taking.

## 2019-03-04 NOTE — Telephone Encounter (Signed)
Pt is concerned since he stopped taking his cholesterol medication for an upcoming apt, but his apt is not till Nov and he's concerned being without that medication for that long. Would like to speak with someone.

## 2019-04-13 ENCOUNTER — Other Ambulatory Visit: Payer: Self-pay

## 2019-04-13 ENCOUNTER — Encounter: Payer: Self-pay | Admitting: Internal Medicine

## 2019-04-13 ENCOUNTER — Ambulatory Visit: Payer: Medicare Other | Admitting: Internal Medicine

## 2019-04-13 VITALS — BP 140/68 | HR 70 | Ht 69.0 in | Wt 202.8 lb

## 2019-04-13 DIAGNOSIS — I252 Old myocardial infarction: Secondary | ICD-10-CM

## 2019-04-13 DIAGNOSIS — I25118 Atherosclerotic heart disease of native coronary artery with other forms of angina pectoris: Secondary | ICD-10-CM | POA: Diagnosis not present

## 2019-04-13 DIAGNOSIS — I10 Essential (primary) hypertension: Secondary | ICD-10-CM | POA: Diagnosis not present

## 2019-04-13 DIAGNOSIS — T466X5A Adverse effect of antihyperlipidemic and antiarteriosclerotic drugs, initial encounter: Secondary | ICD-10-CM

## 2019-04-13 DIAGNOSIS — Z789 Other specified health status: Secondary | ICD-10-CM

## 2019-04-13 DIAGNOSIS — E782 Mixed hyperlipidemia: Secondary | ICD-10-CM | POA: Diagnosis not present

## 2019-04-13 DIAGNOSIS — M791 Myalgia, unspecified site: Secondary | ICD-10-CM

## 2019-04-13 MED ORDER — EZETIMIBE 10 MG PO TABS: 10.0000 mg | ORAL_TABLET | Freq: Every day | ORAL | 3 refills | Status: DC

## 2019-04-13 NOTE — Patient Instructions (Signed)
Medication Instructions:  Your physician has recommended you make the following change in your medication: START zetia 10mg  daily  Continue other current medications  *If you need a refill on your cardiac medications before your next appointment, please call your pharmacy*  Lab Work: FASTING lab work in 3 months (before next lipid clinic visit) If you have labs (blood work) drawn today and your tests are completely normal, you will receive your results only by: Marland Kitchen MyChart Message (if you have MyChart) OR . A paper copy in the mail If you have any lab test that is abnormal or we need to change your treatment, we will call you to review the results.  Testing/Procedures: NONE  Follow-Up: At [facility], you and your health needs are our priority.  As part of our continuing mission to provide you with exceptional heart care, we have created designated Provider Care Teams.  These Care Teams include your primary Cardiologist (physician) and Advanced Practice Providers (APPs -  Physician Assistants and Nurse Practitioners) who all work together to provide you with the care you need, when you need it.  Your next appointment:   3 month(s) - LIPID CLINIC  The format for your next appointment:   Either In Person or Virtual  Provider:   Raliegh Ip Mali Hilty, MD  Other Instructions

## 2019-04-13 NOTE — Progress Notes (Signed)
LIPID CLINIC CONSULT NOTE  Chief Complaint:  Manage dyslipidemia  Primary Care Physician: Kyle Du, MD  Primary Cardiologist:  Kyle Sable, MD  HPI:  Kyle Patel. is a 73 y.o. male who is being seen today for the evaluation of statin intolerance at the request of Kyle Commons, MD. This is a pleasant 73 year old male with history of non-STEMI who had coronary angiography in November 2018.  He was found to have a distal 100% LAD stenosis.  Ultimately the area could not be stented but was ballooned.  He was treated medically and found to have normal LVEF on echo.  As part of risk factor modification.  He was noted to have persistent dyslipidemia.  He was previously on Crestor 20 mg every other day however had significant achiness in his arms which disappeared after discontinuing the medication.  He was then switched to pravastatin 80mg  and then reduce the dose to 40 mg for persistent symptoms.  After 2 to 4 weeks since stopping the medications his symptoms have resolved.  Based on this he was referred for further evaluation and management options given statin intolerance.  Recent labs just under a year ago showed total cholesterol 192, triglycerides 74, HDL 62 and LDL 115.  Previously his LDL had been close to target on statin.  He reports a variable diet but does not eat a lot of saturated fats.  He generally eats chicken which is baked or barbecue.  PMHx:  Past Medical History:  Diagnosis Date  . Arthritis   . 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 COPD (chronic obstructive pulmonary disease) (Kyle Patel)   . 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 Hypercholesteremia   . Hypertension   . Pulmonary embolism ([place]) 10/2011    Past Surgical History:  Procedure Laterality Date  . ABDOMINAL SURGERY     colon polyp removed 2007- sepsis, coma for 4 months per pt  . COLONOSCOPY  03/22/2010   RMR: 1. Normal rectum 2. Diminutive polyp at the  mouth of the ileocecal valve  status post cold biopsy removal. Remaideer of the colonic mucosa and terminal ileum mucosa appeared normal.   . COLONOSCOPY N/A 06/25/2015   Procedure: COLONOSCOPY;  Surgeon: Kyle Dolin, MD;  [facility];  Service: Endoscopy;  Laterality: N/A;  200-rescheduled 1/30 per G[place]   . CORONARY BALLOON ANGIOPLASTY N/A 04/24/2017   Procedure: CORONARY BALLOON ANGIOPLASTY;  Surgeon: Kyle Booze, MD;  Location: [facility];  Service: Cardiovascular;  Laterality: N/A;  . LEFT HEART CATH AND CORONARY ANGIOGRAPHY N/A 04/24/2017   Procedure: LEFT HEART CATH AND CORONARY ANGIOGRAPHY;  Surgeon: Kyle Booze, MD;  Location: [facility];  Service: Cardiovascular;  Laterality: N/A;  . LUMBAR LAMINECTOMY  06/02/2012  . LUMBAR LAMINECTOMY/DECOMPRESSION MICRODISCECTOMY  06/02/2012   Procedure: LUMBAR LAMINECTOMY/DECOMPRESSION MICRODISCECTOMY 1 LEVEL;  Surgeon: Kyle Stakes, MD;  Location: [facility];  Service: Neurosurgery;  Laterality: Bilateral;  Bilateral Lumbar five-sacral one Foraminotomy and microdiskectomy     FAMHx:  Family History  Problem Relation Age of Onset  . Heart attack Mother   . Diabetes Mother   . Stroke Father   . Heart attack Father   . Arthritis Other   . Colon cancer Sister 24       Estimated age of onset; just found out about it in 72.  Marland Kitchen Neuropathy Neg Hx     SOCHx:   reports  that he quit smoking about 14 years ago. His smoking use included cigarettes. He has a 2.00 pack-year smoking history. He has never used smokeless tobacco. He reports that he does not drink alcohol or use drugs.  ALLERGIES:  Allergies  Allergen Reactions  . Levaquin [Levofloxacin] Itching    ROS: Pertinent items noted in HPI and remainder of comprehensive ROS otherwise negative.  HOME MEDS: Current Outpatient Medications on File Prior to Visit  Medication Sig Dispense Refill  . aspirin 81 MG EC tablet Take 81 mg by  mouth daily.    . Coenzyme Q10-Vitamin E (QUNOL ULTRA COQ10) 100-150 MG-UNIT CAPS Take 1 capsule by mouth at bedtime.    Marland Kitchen lisinopril-hydrochlorothiazide (ZESTORETIC) 20-12.5 MG tablet Take 1 tablet by mouth 2 (two) times daily. 180 tablet 3  . metoprolol succinate (TOPROL-XL) 25 MG 24 hr tablet TAKE (1) TABLET BY MOUTH ONCE DAILY. 90 tablet 3  . Misc Natural Products (OSTEO BI-FLEX ADV JOINT SHIELD) TABS Take 1 tablet by mouth daily.    . Multiple Vitamins-Minerals (CENTRUM PO) Take 1 tablet by mouth every morning.    . nitroGLYCERIN (NITROSTAT) 0.4 MG SL tablet PLACE 1 TAB UNDER TONGUE EVERY 5 MIN IF NEEDED FOR CHEST PAIN. MAY USE 3 TIMES.NO RELIEF CALL 911. 25 tablet 0  . pregabalin (LYRICA) 150 MG capsule Take 150 mg by mouth at bedtime.     No current facility-administered medications on file prior to visit.     LABS/IMAGING: No results found for this or any previous visit (from the past 48 hour(s)). No results found.  LIPID PANEL:    Component Value Date/Time   CHOL 192 05/14/2018 0924   TRIG 74 05/14/2018 0924   HDL 62 05/14/2018 0924   CHOLHDL 3.1 05/14/2018 0924   VLDL 15 05/14/2018 0924   LDLCALC 115 (H) 05/14/2018 0924    WEIGHTS: Wt Readings from Last 3 Encounters:  04/13/19 202 lb 12.8 oz (92 kg)  02/16/19 202 lb (91.6 kg)  04/30/18 190 lb (86.2 kg)    VITALS: BP 140/68   Pulse 70   Ht 5\' 9"  (1.753 m)   Wt 202 lb 12.8 oz (92 kg)   SpO2 96%   BMI 29.95 kg/m   EXAM: General appearance: alert and no distress Lungs: clear to auscultation bilaterally Heart: regular rate and rhythm Extremities: extremities normal, atraumatic, no cyanosis or edema Neurologic: Grossly normal  EKG: Deferred  ASSESSMENT: 1. Mixed dyslipidemia, goal LDL less than 70 2. Statin intolerance-myalgias 3. Coronary artery disease with history of NSTEMI 4. Hypertension  PLAN: 1.   Kyle Patel has a mixed dyslipidemia but has been statin intolerant due to myalgias.  He is failed both  rosuvastatin and pravastatin.  He is not interested in starting any other statins.  I discussed PCSK9 inhibitor therapy and is soon as I mentioned that it was injectable he said "absolutely no way".  Therefore we changed discussion to talk about other possible noninjectable alternatives.  1 option for him would be ezetimibe 10 mg daily.  If he does not quite meet target for that he may be a candidate to add Nexletol.  Cost with that would obviously be higher.  I recommend we start with the ezetimibe and is discussed possible side effects with him to look for today.  We will plan to repeat lipid in about 3 months.  Thanks again for the kind referral.  Pixie Casino, MD, FACC, [place] Director of  the Advanced Lipid Disorders &  Cardiovascular Risk Reduction Clinic Diplomate of the American Board of Clinical Lipidology Attending Cardiologist  Direct Dial: [phone]  Fax: [phone]  Website:  [email or web address] 04/13/2019, 12:39 PM

## 2019-07-04 ENCOUNTER — Other Ambulatory Visit: Payer: Self-pay

## 2019-07-04 ENCOUNTER — Ambulatory Visit: Payer: Medicare Other | Admitting: Internal Medicine

## 2019-07-04 ENCOUNTER — Encounter (INDEPENDENT_AMBULATORY_CARE_PROVIDER_SITE_OTHER): Payer: Self-pay

## 2019-07-04 ENCOUNTER — Encounter: Payer: Self-pay | Admitting: Internal Medicine

## 2019-07-04 VITALS — BP 137/69 | HR 62 | Ht 69.5 in | Wt 202.8 lb

## 2019-07-04 DIAGNOSIS — E782 Mixed hyperlipidemia: Secondary | ICD-10-CM

## 2019-07-04 DIAGNOSIS — T466X5A Adverse effect of antihyperlipidemic and antiarteriosclerotic drugs, initial encounter: Secondary | ICD-10-CM

## 2019-07-04 DIAGNOSIS — T466X5D Adverse effect of antihyperlipidemic and antiarteriosclerotic drugs, subsequent encounter: Secondary | ICD-10-CM

## 2019-07-04 DIAGNOSIS — M791 Myalgia, unspecified site: Secondary | ICD-10-CM

## 2019-07-04 DIAGNOSIS — I25118 Atherosclerotic heart disease of native coronary artery with other forms of angina pectoris: Secondary | ICD-10-CM

## 2019-07-04 MED ORDER — EZETIMIBE 10 MG PO TABS: 10.0000 mg | ORAL_TABLET | Freq: Every day | ORAL | 3 refills | Status: DC

## 2019-07-04 NOTE — Patient Instructions (Signed)
Medication Instructions:  Your physician recommends that you continue on your current medications as directed. Please refer to the Current Medication list given to you today.  If you need a refill on your cardiac medications before your next appointment, please call your pharmacy.   Lab work: Lipids If you have labs (blood work) drawn today and your tests are completely normal, you will receive your results only by: MyChart Message (if you have MyChart) OR A paper copy in the mail If you have any lab test that is abnormal or we need to change your treatment, we will call you to review the results.  Testing/Procedures: NONE  Follow-Up: At [facility], you and your health needs are our priority.  As part of our continuing mission to provide you with exceptional heart care, we have created designated Provider Care Teams.  These Care Teams include your primary Cardiologist (physician) and Advanced Practice Providers (APPs -  Physician Assistants and Nurse Practitioners) who all work together to provide you with the care you need, when you need it. You may see Dr. Hilty or one of the following Advanced Practice Providers on your designated Care Team:    Hao Meng, PA-C  Angela Duke, PA-C or   Krista Kroeger, PA-C  Your physician wants you to follow-up in: 1 year with Dr. Hilty       

## 2019-07-04 NOTE — Progress Notes (Signed)
LIPID CLINIC CONSULT NOTE  Chief Complaint:  Manage dyslipidemia  Primary Care Physician: Maryruth Hancock, MD  Primary Cardiologist:  Kate Sable, MD  HPI:  Kyle Locklar. is a 74 y.o. male who is being seen today for the evaluation of statin intolerance at the request of [name], MD. This is a pleasant 74 year old male with history of non-STEMI who had coronary angiography in November 2018.  He was found to have a distal 100% LAD stenosis.  Ultimately the area could not be stented but was ballooned.  He was treated medically and found to have normal LVEF on echo.  As part of risk factor modification.  He was noted to have persistent dyslipidemia.  He was previously on Crestor 20 mg every other day however had significant achiness in his arms which disappeared after discontinuing the medication.  He was then switched to pravastatin 80mg  and then reduce the dose to 40 mg for persistent symptoms.  After 2 to 4 weeks since stopping the medications his symptoms have resolved.  Based on this he was referred for further evaluation and management options given statin intolerance.  Recent labs just under a year ago showed total cholesterol 192, triglycerides 74, HDL 62 and LDL 115.  Previously his LDL had been close to target on statin.  He reports a variable diet but does not eat a lot of saturated fats.  He generally eats chicken which is baked or barbecue.  07/04/2019  Kyle Patel is seen today in follow-up.  His primary care provider retired.  He will be establishing with a new PCP.  He has been taking ezetimibe but has not had repeat lipids.  He said he went to get it done but there was a problem at the lab.  We will need to get updated lipids.  PMHx:  Past Medical History:  Diagnosis Date  . Arthritis   . 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 COPD (chronic obstructive pulmonary disease) ([place])   . 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  Hypercholesteremia   . Hypertension   . Pulmonary embolism ([place]) 10/2011    Past Surgical History:  Procedure Laterality Date  . ABDOMINAL SURGERY     colon polyp removed 2007- sepsis, coma for 4 months per pt  . COLONOSCOPY  03/22/2010   RMR: 1. Normal rectum 2. Diminutive polyp at the mouth of the ileocecal valve  status post cold biopsy removal. Remaideer of the colonic mucosa and terminal ileum mucosa appeared normal.   . COLONOSCOPY N/A 06/25/2015   Procedure: COLONOSCOPY;  Surgeon: Daneil Dolin, MD;  [facility];  Service: Endoscopy;  Laterality: N/A;  200-rescheduled 1/30 per [place]   . CORONARY BALLOON ANGIOPLASTY N/A 04/24/2017   Procedure: CORONARY BALLOON ANGIOPLASTY;  Surgeon: Jettie Booze, MD;  Location: [facility];  Service: Cardiovascular;  Laterality: N/A;  . LEFT HEART CATH AND CORONARY ANGIOGRAPHY N/A 04/24/2017   Procedure: LEFT HEART CATH AND CORONARY ANGIOGRAPHY;  Surgeon: Jettie Booze, MD;  Location: [facility];  Service: Cardiovascular;  Laterality: N/A;  . LUMBAR LAMINECTOMY  06/02/2012  . LUMBAR LAMINECTOMY/DECOMPRESSION MICRODISCECTOMY  06/02/2012   Procedure: LUMBAR LAMINECTOMY/DECOMPRESSION MICRODISCECTOMY 1 LEVEL;  Surgeon: Floyce Stakes, MD;  Location: [facility];  Service: Neurosurgery;  Laterality: Bilateral;  Bilateral Lumbar five-sacral one Foraminotomy and microdiskectomy     FAMHx:  Family History  Problem Relation Age of Onset  .  Heart attack Mother   . Diabetes Mother   . Stroke Father   . Heart attack Father   . Arthritis Other   . Colon cancer Sister 61       Estimated age of onset; just found out about it in 66.  Marland Kitchen Neuropathy Neg Hx     SOCHx:   reports that he quit smoking about 15 years ago. His smoking use included cigarettes. He has a 2.00 pack-year smoking history. He has never used smokeless tobacco. He reports that he does not drink alcohol or use drugs.  ALLERGIES:  Allergies   Allergen Reactions  . Levaquin [Levofloxacin] Itching    ROS: Pertinent items noted in HPI and remainder of comprehensive ROS otherwise negative.  HOME MEDS: Current Outpatient Medications on File Prior to Visit  Medication Sig Dispense Refill  . aspirin 81 MG EC tablet Take 81 mg by mouth daily.    . Coenzyme Q10-Vitamin E (QUNOL ULTRA COQ10) 100-150 MG-UNIT CAPS Take 1 capsule by mouth at bedtime.    Marland Kitchen lisinopril-hydrochlorothiazide (ZESTORETIC) 20-12.5 MG tablet Take 1 tablet by mouth 2 (two) times daily. 180 tablet 3  . metoprolol succinate (TOPROL-XL) 25 MG 24 hr tablet TAKE (1) TABLET BY MOUTH ONCE DAILY. 90 tablet 3  . Misc Natural Products (OSTEO BI-FLEX ADV JOINT SHIELD) TABS Take 1 tablet by mouth daily.    . Multiple Vitamins-Minerals (CENTRUM PO) Take 1 tablet by mouth every morning.    . nitroGLYCERIN (NITROSTAT) 0.4 MG SL tablet PLACE 1 TAB UNDER TONGUE EVERY 5 MIN IF NEEDED FOR CHEST PAIN. MAY USE 3 TIMES.NO RELIEF CALL 911. 25 tablet 0  . pregabalin (LYRICA) 150 MG capsule Take 150 mg by mouth at bedtime. Pt has been out of medication for 3 weeks     No current facility-administered medications on file prior to visit.    LABS/IMAGING: No results found for this or any previous visit (from the past 48 hour(s)). No results found.  LIPID PANEL:    Component Value Date/Time   CHOL 192 05/14/2018 0924   TRIG 74 05/14/2018 0924   HDL 62 05/14/2018 0924   CHOLHDL 3.1 05/14/2018 0924   VLDL 15 05/14/2018 0924   LDLCALC 115 (H) 05/14/2018 0924    WEIGHTS: Wt Readings from Last 3 Encounters:  07/04/19 202 lb 12.8 oz (92 kg)  04/13/19 202 lb 12.8 oz (92 kg)  02/16/19 202 lb (91.6 kg)    VITALS: BP 137/69   Pulse 62   Ht 5' 9.5" (1.765 m)   Wt 202 lb 12.8 oz (92 kg)   SpO2 97%   BMI 29.52 kg/m   EXAM: Deferred  EKG: Deferred  ASSESSMENT: 1. Mixed dyslipidemia, goal LDL less than 70 2. Statin intolerance-myalgias 3. Coronary artery disease with  history of NSTEMI 4. Hypertension  PLAN: 1.   Kyle Patel needs reassessment of his lipids to target LDL goal less than 70.  He is compliant with ezetimibe.  We will order fasting lipid profile today.  Plan follow-up afterwards and further adjustment of medications as necessary.  Pixie Casino, MD, [facility], [place] Director of the Advanced Lipid Disorders &  Cardiovascular Risk Reduction Clinic Diplomate of the American Board of Clinical Lipidology Attending Cardiologist  Direct Dial: [phone]  Fax: [phone]  Website:  [email or web address] 07/04/2019, 3:23 PM

## 2019-07-05 LAB — LIPID PANEL
Chol/HDL Ratio: 4.5 ratio (ref 0.0–5.0)
Cholesterol, Total: 238 mg/dL — ABNORMAL HIGH (ref 100–199)
HDL: 53 mg/dL (ref >39)
LDL Chol Calc (NIH): 163 mg/dL — ABNORMAL HIGH (ref 0–99)
Triglycerides: 124 mg/dL (ref 0–149)
VLDL Cholesterol Cal: 22 mg/dL (ref 5–40)

## 2019-07-07 ENCOUNTER — Telehealth: Payer: Self-pay | Admitting: Internal Medicine

## 2019-07-07 NOTE — Telephone Encounter (Signed)
PA for repatha sureclick submitted via CMM (Key: [id number])  LM for patient to call back about lab results on 07/06/19  Pixie Casino, MD  07/05/2019 9:43 AM EST    LDL remains above goal of <70 given history of CAD - on zetia. Recommend repatha 140 q2week. Follow-up with repeat lipids in 3-4 months.  Dr Lemmie Evens

## 2019-07-07 NOTE — Telephone Encounter (Signed)
-----   Message from Fidel Levy, RN sent at 07/05/2019  4:19 PM EST ----- Regarding: repatha PA BIN [id number] PCN [id number] COS ID: [id number]  [place]

## 2019-07-13 ENCOUNTER — Ambulatory Visit: Payer: Medicare Other | Attending: Internal Medicine

## 2019-07-13 DIAGNOSIS — Z23 Encounter for immunization: Secondary | ICD-10-CM | POA: Insufficient documentation

## 2019-07-13 NOTE — Progress Notes (Signed)
   [id number]  Name:  Kyle Patel.    MRN: [id number] DOB: 08-03-1945  07/13/2019  Kyle Patel was observed post Covid-19 immunization for 15 minutes without incidence. He was provided with Vaccine Information Sheet and instruction to access the V-Safe system.   Kyle Patel was instructed to call 911 with any severe reactions post vaccine: Marland Kitchen Difficulty breathing  . Swelling of your face and throat  . A fast heartbeat  . A bad rash all over your body  . Dizziness and weakness    Immunizations Administered    Name Date Dose VIS Date Route   Moderna COVID-19 Vaccine 07/13/2019 10:42 AM 0.5 mL 04/26/2019 Intramuscular   Manufacturer: Moderna   Lot: [id number]   [id number]

## 2019-07-14 NOTE — Telephone Encounter (Signed)
Spoke with patient about lab results and PCSK9. He states he was not aware of the possibility of a new medication PCSK9. He is not interested in doing an injection and would prefer an oral medication   Routed to MD

## 2019-07-14 NOTE — Telephone Encounter (Signed)
Request Reference Number: [id number]. REPATHA SURE INJ 140MG /ML is approved through 01/04/2020

## 2019-07-15 ENCOUNTER — Encounter: Payer: Self-pay | Admitting: Student

## 2019-07-15 ENCOUNTER — Telehealth (INDEPENDENT_AMBULATORY_CARE_PROVIDER_SITE_OTHER): Payer: Medicare Other | Admitting: Student

## 2019-07-15 ENCOUNTER — Telehealth: Payer: Self-pay | Admitting: Student

## 2019-07-15 VITALS — BP 137/69 | Wt 202.0 lb

## 2019-07-15 DIAGNOSIS — N183 Chronic kidney disease, stage 3 unspecified: Secondary | ICD-10-CM

## 2019-07-15 DIAGNOSIS — E785 Hyperlipidemia, unspecified: Secondary | ICD-10-CM

## 2019-07-15 DIAGNOSIS — Z79899 Other long term (current) drug therapy: Secondary | ICD-10-CM

## 2019-07-15 DIAGNOSIS — I252 Old myocardial infarction: Secondary | ICD-10-CM | POA: Diagnosis not present

## 2019-07-15 DIAGNOSIS — I10 Essential (primary) hypertension: Secondary | ICD-10-CM

## 2019-07-15 DIAGNOSIS — Z955 Presence of coronary angioplasty implant and graft: Secondary | ICD-10-CM

## 2019-07-15 DIAGNOSIS — I129 Hypertensive chronic kidney disease with stage 1 through stage 4 chronic kidney disease, or unspecified chronic kidney disease: Secondary | ICD-10-CM | POA: Diagnosis not present

## 2019-07-15 DIAGNOSIS — I2510 Atherosclerotic heart disease of native coronary artery without angina pectoris: Secondary | ICD-10-CM

## 2019-07-15 MED ORDER — ROSUVASTATIN CALCIUM 10 MG PO TABS: 10.0000 mg | ORAL_TABLET | Freq: Every day | ORAL | 5 refills | Status: DC

## 2019-07-15 NOTE — Progress Notes (Signed)
Virtual Visit via Telephone Note   This visit type was conducted due to national recommendations for restrictions regarding the COVID-19 Pandemic (e.g. social distancing) in an effort to limit this patient's exposure and mitigate transmission in our community.  Due to his co-morbid illnesses, this patient is at least at moderate risk for complications without adequate follow up.  This format is felt to be most appropriate for this patient at this time.  The patient did not have access to video technology/had technical difficulties with video requiring transitioning to audio format only (telephone).  All issues noted in this document were discussed and addressed.  No physical exam could be performed with this format.  Please refer to the patient's chart for his  consent to telehealth for [facility].   Date:  07/15/2019   ID:  Kyle Charleston., DOB 1945-12-01, MRN [id number]  Patient Location: Home Provider Location: Office  PCP:  Maryruth Hancock, MD  Cardiologist:  Kate Sable, MD  Electrophysiologist:  None   Evaluation Performed:  Follow-Up Visit  Chief Complaint: 6 month visit  History of Present Illness:    Kyle Bardi. is a 74 y.o. male with past medical history of CAD (s/p NSTEMI in 03/2017 with 100% distal LAD stenosis treated with POBA as stent could not be delivered), HTN, HLD, Stage 3 CKD and history of PE who presents for a 74-month follow-up telehealth visit.  He was last examined by Dr. Bronson Ing in 01/2019 and denied any recent chest pain or dyspnea on exertion at that time. He did have neuropathy and chronic joint pain.  He had been intolerant to Crestor and Pravastatin in the past, therefore he was referred to the lipid clinic for consideration of PCSK9 inhibitor therapy. He was continued on ASA 81mg  daily and Toprol-XL 25mg  daily with Lisinopril-HCTZ being titrated to 20-12.5mg  BID in the setting of elevated BP.  He was evaluated by Dr. Debara Pickett on 04/13/2019 and  declined PCSK9 inhibitor therapy due to this being an injectable medication. He was started on Zetia 10 mg daily with consideration of adding Nexletol pending follow-up labs. LDL was obtained on 07/04/2019 and showed total cholesterol 238, triglycerides 124 and LDL 163.  It was recommended that he start Repatha but he had declined this in the past.  In talking with the patient today, he reports overall doing well since his last office visit. He denies any recent chest pain. He does report having dyspnea when climbing stairs but this has been present for a while and he denies any acute changes in his respiratory status. No recent orthopnea, PND, lower extremity edema or palpitations. He was previously going to the [facility] prior to the pandemic. He did receive his first COVID vaccination this past week and reports overall doing well with this. Exprienced arm soreness which has now improved.   He does admit to some dietary indiscretion and says he has been consuming fried foods more regularly instead of baking them. He feels like this is playing a role in his elevated cholesterol levels. He questions if he could go back on Crestor as he feels like his myalgias were minimal at that time and he wishes to avoid any injectable medications.  The patient does not  have symptoms concerning for COVID-19 infection (fever, chills, cough, or new shortness of breath).    Past Medical History:  Diagnosis Date  . Arthritis   . 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 CAD (  coronary artery disease)    a. s/p NSTEMI in 03/2017 with 100% distal LAD stenosis treated with POBA as stent could not be delivered  . COPD (chronic obstructive pulmonary disease) ([place])   . 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 Hypercholesteremia   . Hypertension   . Pulmonary embolism ([place]) 10/2011   Past Surgical History:  Procedure Laterality Date  . ABDOMINAL SURGERY     colon polyp removed 2007- sepsis, coma  for 4 months per pt  . COLONOSCOPY  03/22/2010   RMR: 1. Normal rectum 2. Diminutive polyp at the mouth of the ileocecal valve  status post cold biopsy removal. Remaideer of the colonic mucosa and terminal ileum mucosa appeared normal.   . COLONOSCOPY N/A 06/25/2015   Procedure: COLONOSCOPY;  Surgeon: Daneil Dolin, MD;  [facility];  Service: Endoscopy;  Laterality: N/A;  200-rescheduled 1/30 per Ginger   . CORONARY BALLOON ANGIOPLASTY N/A 04/24/2017   Procedure: CORONARY BALLOON ANGIOPLASTY;  Surgeon: Jettie Booze, MD;  Location: [place] CV LAB;  Service: Cardiovascular;  Laterality: N/A;  . LEFT HEART CATH AND CORONARY ANGIOGRAPHY N/A 04/24/2017   Procedure: LEFT HEART CATH AND CORONARY ANGIOGRAPHY;  Surgeon: Jettie Booze, MD;  Location: [place] CV LAB;  Service: Cardiovascular;  Laterality: N/A;  . LUMBAR LAMINECTOMY  06/02/2012  . LUMBAR LAMINECTOMY/DECOMPRESSION MICRODISCECTOMY  06/02/2012   Procedure: LUMBAR LAMINECTOMY/DECOMPRESSION MICRODISCECTOMY 1 LEVEL;  Surgeon: Floyce Stakes, MD;  Location: [place] [facility]UR[facility] ORS;  Service: Neurosurgery;  Laterality: Bilateral;  Bilateral Lumbar five-sacral one Foraminotomy and microdiskectomy      Current Meds  Medication Sig  . aspirin 81 MG EC tablet Take 81 mg by mouth daily.  . Coenzyme Q10-Vitamin E (QUNOL ULTRA COQ10) 100-150 MG-UNIT CAPS Take 1 capsule by mouth at bedtime.  Marland Kitchen ezetimibe (ZETIA) 10 MG tablet Take 1 tablet (10 mg total) by mouth daily.  Marland Kitchen lisinopril-hydrochlorothiazide (ZESTORETIC) 20-12.5 MG tablet Take 1 tablet by mouth 2 (two) times daily.  . metoprolol succinate (TOPROL-XL) 25 MG 24 hr tablet TAKE (1) TABLET BY MOUTH ONCE DAILY.  Marland Kitchen Misc Natural Products (OSTEO BI-FLEX ADV JOINT SHIELD) TABS Take 1 tablet by mouth daily.  . Multiple Vitamins-Minerals (CENTRUM PO) Take 1 tablet by mouth every morning.  . nitroGLYCERIN (NITROSTAT) 0.4 MG SL tablet PLACE 1 TAB UNDER TONGUE EVERY 5 MIN IF NEEDED  FOR CHEST PAIN. MAY USE 3 TIMES.NO RELIEF CALL 911.  . pregabalin (LYRICA) 150 MG capsule Take 150 mg by mouth at bedtime. Pt has been out of medication for 3 weeks     Allergies:   Levaquin [levofloxacin] and Statins   Social History   Tobacco Use  . Smoking status: Former Smoker    Packs/day: 0.10    Years: 20.00    Pack years: 2.00    Types: Cigarettes    Quit date: 04/25/2004    Years since quitting: 15.2  . Smokeless tobacco: Never Used  Substance Use Topics  . Alcohol use: No  . Drug use: No     Family Hx: The patient's family history includes Arthritis in an other family member; Colon cancer (age of onset: 48) in his sister; Diabetes in his mother; Heart attack in his father and mother; Stroke in his father. There is no history of Neuropathy.  ROS:   Please see the history of present illness.     All other systems reviewed and are negative.   Prior CV studies:   The following studies  were reviewed today:  Cardiac Catheterization: 03/2017  Dist LAD lesion is 100% stenosed. Wire dissection at the lesion due to severe tortuosity. Initial TIMI 0 flow improved to TIMI2 flow after balloon inflations. Chest pain improved at the end of the case. I don't think stent could be delivered.  Mid RCA lesion is 10% stenosed.  LV end diastolic pressure is normal.  There is no aortic valve stenosis.  Balloon angioplasty was performed using a BALLOON MAVERICK OTW 2.0X15.  Post intervention, there is a 90% residual stenosis.   Medical therapy going forward including DAPT for 12 months along with aggressive secondary prevention.  LVEF preserved.  Small apical wall motion abnormality by echo.    Anticipate discharge on Sunday if he is doing well.    Echocardiogram: 10/2018 IMPRESSIONS    1. The left ventricle has normal systolic function with an ejection  fraction of 60-65%. The cavity size was normal. There is mild concentric  left ventricular hypertrophy. Left  ventricular diastolic Doppler  parameters are consistent with impaired  relaxation. Indeterminate filling pressures No evidence of left  ventricular regional wall motion abnormalities.  2. The mitral valve is grossly normal.  3. The tricuspid valve is grossly normal.  4. The aortic valve is tricuspid. Mild thickening of the aortic valve.  5. The aortic root is normal in size and structure.  6. The inferior vena cava was dilated in size with >50% respiratory  variability.  Labs/Other Tests and Data Reviewed:    EKG:  No ECG reviewed.  Recent Labs: No results found for requested labs within last 8760 hours.   Recent Lipid Panel Lab Results  Component Value Date/Time   CHOL 238 (H) 07/04/2019 03:01 PM   TRIG 124 07/04/2019 03:01 PM   HDL 53 07/04/2019 03:01 PM   CHOLHDL 4.5 07/04/2019 03:01 PM   CHOLHDL 3.1 05/14/2018 09:24 AM   LDLCALC 163 (H) 07/04/2019 03:01 PM    Wt Readings from Last 3 Encounters:  07/15/19 202 lb (91.6 kg)  07/04/19 202 lb 12.8 oz (92 kg)  04/13/19 202 lb 12.8 oz (92 kg)     Objective:    Vital Signs:  BP 137/69   Wt 202 lb (91.6 kg)   BMI 29.40 kg/m    General: Pleasant male sounding in NAD Psych: Normal affect. Neuro: Alert and oriented X 3. Lungs:  Resp regular and unlabored while talking on the phone.    ASSESSMENT & PLAN:    1. CAD - s/p NSTEMI in 03/2017 with 100% distal LAD stenosis treated with POBA as stent could not be delivered. He denies any recent chest pain. He does have baseline dyspnea on exertion when climbing stairs which has overall been stable and denies any symptoms when doing his routine activities. - Continue current medication regimen at this time including ASA 81 mg daily and Toprol-XL 25 mg daily. Will try to reinitiate statin therapy as outlined below while continuing Zetia 10 mg daily.   2. HTN - BP was at 137/69 on most recent check at the time of his last office visit 2 weeks ago. He does have a BP cuff at  home but does not check this regularly. I did encourage him to check readings in the ambulatory setting as he reports they are typically elevated when at a medical office.  - Continue current regimen for now including Toprol-XL 25mg  daily and Lisinopril-HCTZ 20-12.5mg  BID.  3. HLD - He was intolerant to Atorvastatin and Crestor 20 mg every other  day in the past. He was started on Zetia last year and most recent lipid panel shows LDL remains elevated at 163. He continues to decline injectable medications and prefers a generic option. He says that his myalgias were minimal on Crestor in the past and he wishes to retry this. Will initiate at 10 mg daily and I informed him he can reduce this to 10 mg every other day if he develops myalgias.  Will forward to Dr. Debara Pickett to make him aware.   4. Stage 3 CKD - baseline creatinine 1. 4 -1.5 by review of prior labs. He is scheduled for repeat labs next month with his PCP per his report.    COVID-19 Education: The signs and symptoms of COVID-19 were discussed with the patient and how to seek care for testing (follow up with PCP or arrange E-visit).  The importance of social distancing was discussed today.  Time:   Today, I have spent 18 minutes with the patient with telehealth technology discussing the above problems.     Medication Adjustments/Labs and Tests Ordered: Current medicines are reviewed at length with the patient today.  Concerns regarding medicines are outlined above.   Tests Ordered: No orders of the defined types were placed in this encounter.   Medication Changes: Meds ordered this encounter  Medications  . rosuvastatin (CRESTOR) 10 MG tablet    Sig: Take 1 tablet (10 mg total) by mouth daily.    Dispense:  30 tablet    Refill:  5    Order Specific Question:   Supervising Provider    Answer:   Dorothy Spark [id number]    Follow Up:  In Person in 6 month(s)  Signed, Erma Heritage, PA-C  07/15/2019 2:32 PM    [place]

## 2019-07-15 NOTE — Telephone Encounter (Signed)
Error

## 2019-07-15 NOTE — Telephone Encounter (Signed)
    Hi Jenna and Dr. Debara Pickett. I just wanted to update you both on this patient. I had a telehealth visit with him today and as previously documented he is not interested in PCSK9 inhibitor therapy.  In reviewing different options, he wished to retry Crestor at a lower dose so I started him back on Crestor 10mg  daily (had myalgias with 20mg  daily and 20mg  every other day in the past).  I reviewed with him he could try taking 10 mg every other day if intolerant to daily dosing or perhaps reduce to 5 mg daily while still continuing Zetia. If you have any additional recommendations, please feel free to reach out to myself or the patient.  Thanks, Erma Heritage, PA-C 07/15/2019, 2:17 PM

## 2019-07-15 NOTE — Patient Instructions (Signed)
Medication Instructions:  Your physician has recommended you make the following change in your medication:   Start Crestor 10 mg Daily   Labwork: Your physician recommends that you return for lab work in: 2 Months    Testing/Procedures: None   Follow-Up: Your physician wants you to follow-up in: 6 Months with Dr.Koneswaran. You will receive a reminder letter in the mail two months in advance. If you don't receive a letter, please call our office to schedule the follow-up appointment.   Any Other Special Instructions Will Be Listed Below (If Applicable).     If you need a refill on your cardiac medications before your next appointment, please call your pharmacy. Thank you for choosing [facility]!  ]

## 2019-07-18 NOTE — Telephone Encounter (Signed)
Thanks [place] - that's all you can do. We'll see how it is tolerated.  Dr Lemmie Evens

## 2019-08-08 ENCOUNTER — Other Ambulatory Visit: Payer: Self-pay

## 2019-08-08 ENCOUNTER — Ambulatory Visit: Payer: Medicare Other | Admitting: Family Medicine

## 2019-08-08 ENCOUNTER — Encounter: Payer: Self-pay | Admitting: Family Medicine

## 2019-08-08 VITALS — BP 156/71 | HR 65 | Temp 97.6°F | Ht 69.5 in | Wt 201.0 lb

## 2019-08-08 DIAGNOSIS — R0600 Dyspnea, unspecified: Secondary | ICD-10-CM

## 2019-08-08 DIAGNOSIS — G609 Hereditary and idiopathic neuropathy, unspecified: Secondary | ICD-10-CM

## 2019-08-08 DIAGNOSIS — N183 Chronic kidney disease, stage 3 unspecified: Secondary | ICD-10-CM | POA: Diagnosis not present

## 2019-08-08 DIAGNOSIS — D649 Anemia, unspecified: Secondary | ICD-10-CM | POA: Diagnosis not present

## 2019-08-08 DIAGNOSIS — I10 Essential (primary) hypertension: Secondary | ICD-10-CM

## 2019-08-08 DIAGNOSIS — I214 Non-ST elevation (NSTEMI) myocardial infarction: Secondary | ICD-10-CM

## 2019-08-08 DIAGNOSIS — Z125 Encounter for screening for malignant neoplasm of prostate: Secondary | ICD-10-CM

## 2019-08-08 MED ORDER — PREGABALIN 150 MG PO CAPS: ORAL_CAPSULE | ORAL | 3 refills | Status: DC

## 2019-08-08 NOTE — Progress Notes (Signed)
New Patient Office Visit  Subjective:  Patient ID: Kyle Folk., male    DOB: 08/20/1945  Age: 74 y.o. MRN: [id number]  CC:  Chief Complaint  Patient presents with  . Medication Refill    Patient need a refill on lyrica medication. Out of med    HPI Kyle Affeldt. presents for peripheral neuropathy-pt with numbness in the feet-multiple specialist with testing and labwork. Last suggestion for stimulator-pt declined  CAD-followed by cardiology-Started on Crestor + Zetia for hyperlipidemia-metoprolol/zestril/HCTZ daily-stable-stents in the past after MI-LAD 100%  Past Medical History:  Diagnosis Date  . Arthritis   . 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 CAD (coronary artery disease)    a. s/p NSTEMI in 03/2017 with 100% distal LAD stenosis treated with POBA as stent could not be delivered  . COPD (chronic obstructive pulmonary disease) ([place])   . 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 Hypercholesteremia   . Hypertension   . Pulmonary embolism ([place]) 10/2011    Past Surgical History:  Procedure Laterality Date  . ABDOMINAL SURGERY     colon polyp removed 2007- sepsis, coma for 4 months per pt  . COLONOSCOPY  03/22/2010   RMR: 1. Normal rectum 2. Diminutive polyp at the mouth of the ileocecal valve  status post cold biopsy removal. Remaideer of the colonic mucosa and terminal ileum mucosa appeared normal.   . COLONOSCOPY N/A 06/25/2015   Procedure: COLONOSCOPY;  Surgeon: Daneil Dolin, MD;  [facility];  Service: Endoscopy;  Laterality: N/A;  200-rescheduled 1/30 per Ginger   . CORONARY BALLOON ANGIOPLASTY N/A 04/24/2017   Procedure: CORONARY BALLOON ANGIOPLASTY;  Surgeon: Jettie Booze, MD;  Location: [place] CV LAB;  Service: Cardiovascular;  Laterality: N/A;  . LEFT HEART CATH AND CORONARY ANGIOGRAPHY N/A 04/24/2017   Procedure: LEFT HEART CATH AND CORONARY ANGIOGRAPHY;  Surgeon: Jettie Booze, MD;   Location: [place] CV LAB;  Service: Cardiovascular;  Laterality: N/A;  . LUMBAR LAMINECTOMY  06/02/2012  . LUMBAR LAMINECTOMY/DECOMPRESSION MICRODISCECTOMY  06/02/2012   Procedure: LUMBAR LAMINECTOMY/DECOMPRESSION MICRODISCECTOMY 1 LEVEL;  Surgeon: Floyce Stakes, MD;  Location: [place] NEURO ORS;  Service: Neurosurgery;  Laterality: Bilateral;  Bilateral Lumbar five-sacral one Foraminotomy and microdiskectomy     Family History  Problem Relation Age of Onset  . Heart attack Mother   . Diabetes Mother   . Stroke Father   . Heart attack Father   . Arthritis Other   . Colon cancer Sister 61       Estimated age of onset; just found out about it in 22.  Marland Kitchen Neuropathy Neg Hx     Social History   Socioeconomic History  . Marital status: Married    Spouse name: Not on file  . Number of children: 1  . Years of education: Not on file  . Highest education level: Not on file  Occupational History  . Occupation: retired  Tobacco Use  . Smoking status: Former Smoker    Packs/day: 0.10    Years: 20.00    Pack years: 2.00    Types: Cigarettes    Quit date: 04/25/2004    Years since quitting: 15.2  . Smokeless tobacco: Never Used  Substance and Sexual Activity  . Alcohol use: No  . Drug use: No  . Sexual activity: Not on file  Other Topics Concern  . Not on file  Social History Narrative   Patient is  right handed.   Patient drinks caffeine occasionally   Social Determinants of Health   Financial Resource Strain:   . Difficulty of Paying Living Expenses:   Food Insecurity:   . Worried About Charity fundraiser in the Last Year:   . Arboriculturist in the Last Year:   Transportation Needs:   . Film/video editor (Medical):   Marland Kitchen Lack of Transportation (Non-Medical):   Physical Activity:   . Days of Exercise per Week:   . Minutes of Exercise per Session:   Stress:   . Feeling of Stress :   Social Connections:   . Frequency of Communication with Friends and Family:   .  Frequency of Social Gatherings with Friends and Family:   . Attends Religious Services:   . Active Member of Clubs or Organizations:   . Attends Archivist Meetings:   Marland Kitchen Marital Status:   Intimate Partner Violence:   . Fear of Current or Ex-Partner:   . Emotionally Abused:   Marland Kitchen Physically Abused:   . Sexually Abused:     ROS Review of Systems  Constitutional: Negative.   Eyes: Negative.   Respiratory: Positive for shortness of breath.        SOB with exertion-last heart test 2020.   Cardiovascular:       CAD  Endocrine: Negative.   Musculoskeletal: Positive for arthralgias.  Allergic/Immunologic: Negative.   Neurological:       Peripheral neuropathy  Psychiatric/Behavioral: Negative.     Objective:   Today's Vitals: BP (!) 156/71 (BP Location: Right Arm, Patient Position: Sitting, Cuff Size: Large)   Pulse 65   Temp 97.6 F (36.4 C) (Temporal)   Ht 5' 9.5" (1.765 m)   Wt 201 lb (91.2 kg)   SpO2 96%   BMI 29.26 kg/m   Physical Exam Constitutional:      Appearance: Normal appearance.  HENT:     Head: Normocephalic and atraumatic.  Eyes:     Conjunctiva/sclera: Conjunctivae normal.  Cardiovascular:     Rate and Rhythm: Normal rate and regular rhythm.     Pulses: Normal pulses.     Heart sounds: Normal heart sounds.  Pulmonary:     Effort: Pulmonary effort is normal.     Breath sounds: Normal breath sounds.  Musculoskeletal:     Cervical back: Normal range of motion and neck supple.  Neurological:     Mental Status: He is alert and oriented to person, place, and time.  Psychiatric:        Mood and Affect: Mood normal.        Behavior: Behavior normal.     Assessment & Plan:    Outpatient Encounter Medications as of 08/08/2019  Medication Sig  . aspirin 81 MG EC tablet Take 81 mg by mouth daily.  . Coenzyme Q10-Vitamin E (QUNOL ULTRA COQ10) 100-150 MG-UNIT CAPS Take 1 capsule by mouth at bedtime.  Marland Kitchen ezetimibe (ZETIA) 10 MG tablet Take 1 tablet  (10 mg total) by mouth daily.  Marland Kitchen lisinopril-hydrochlorothiazide (ZESTORETIC) 20-12.5 MG tablet Take 1 tablet by mouth 2 (two) times daily.  . metoprolol succinate (TOPROL-XL) 25 MG 24 hr tablet TAKE (1) TABLET BY MOUTH ONCE DAILY.  Marland Kitchen Misc Natural Products (OSTEO BI-FLEX ADV JOINT SHIELD) TABS Take 1 tablet by mouth daily.  . Multiple Vitamins-Minerals (CENTRUM PO) Take 1 tablet by mouth every morning.  . nitroGLYCERIN (NITROSTAT) 0.4 MG SL tablet PLACE 1 TAB UNDER TONGUE EVERY 5 MIN IF  NEEDED FOR CHEST PAIN. MAY USE 3 TIMES.NO RELIEF CALL 911.  . pregabalin (LYRICA) 150 MG capsule Take 150 mg by mouth at bedtime. Pt has been out of medication for 3 weeks  . rosuvastatin (CRESTOR) 10 MG tablet Take 1 tablet (10 mg total) by mouth daily.   No facility-administered encounter medications on file as of 08/08/2019.    Follow-up:   Kyle Patel Hannah Beat, MD

## 2019-08-08 NOTE — Patient Instructions (Addendum)
labwork  Pt understands he will need to find a new primary care provider to follow him for the Lyrica since it is a controlled substance and needs monitoring. Pt does not want a referral for stimulator  Pt to see cardiology this month if possible-short of breath while completing yard work

## 2019-08-09 ENCOUNTER — Telehealth: Payer: Self-pay | Admitting: Emergency Medicine

## 2019-08-09 ENCOUNTER — Encounter: Payer: Self-pay | Admitting: Emergency Medicine

## 2019-08-09 LAB — COMPLETE METABOLIC PANEL WITH GFR
AG Ratio: 1.4 (calc) (ref 1.0–2.5)
ALT: 18 U/L (ref 9–46)
AST: 26 U/L (ref 10–35)
Albumin: 4.2 g/dL (ref 3.6–5.1)
Alkaline phosphatase (APISO): 61 U/L (ref 35–144)
BUN/Creatinine Ratio: 13 (calc) (ref 6–22)
BUN: 23 mg/dL (ref 7–25)
CO2: 21 mmol/L (ref 20–32)
Calcium: 9.8 mg/dL (ref 8.6–10.3)
Chloride: 113 mmol/L — ABNORMAL HIGH (ref 98–110)
Creat: 1.77 mg/dL — ABNORMAL HIGH (ref 0.70–1.18)
GFR, Est African American: 44 mL/min/{1.73_m2} — ABNORMAL LOW (ref >=60)
GFR, Est Non African American: 38 mL/min/{1.73_m2} — ABNORMAL LOW (ref >=60)
Globulin: 3.1 g/dL (calc) (ref 1.9–3.7)
Glucose, Bld: 74 mg/dL (ref 65–99)
Potassium: 5.3 mmol/L (ref 3.5–5.3)
Sodium: 141 mmol/L (ref 135–146)
Total Bilirubin: 0.4 mg/dL (ref 0.2–1.2)
Total Protein: 7.3 g/dL (ref 6.1–8.1)

## 2019-08-09 LAB — CBC WITH DIFFERENTIAL/PLATELET
Absolute Monocytes: 1197 cells/uL — ABNORMAL HIGH (ref 200–950)
Basophils Absolute: 27 cells/uL (ref 0–200)
Basophils Relative: 0.3 %
Eosinophils Absolute: 135 cells/uL (ref 15–500)
Eosinophils Relative: 1.5 %
HCT: 40.3 % (ref 38.5–50.0)
Hemoglobin: 13.4 g/dL (ref 13.2–17.1)
Lymphs Abs: 2250 cells/uL (ref 850–3900)
MCH: 30.9 pg (ref 27.0–33.0)
MCHC: 33.3 g/dL (ref 32.0–36.0)
MCV: 92.9 fL (ref 80.0–100.0)
MPV: 11.4 fL (ref 7.5–12.5)
Monocytes Relative: 13.3 %
Neutro Abs: 5391 cells/uL (ref 1500–7800)
Neutrophils Relative %: 59.9 %
Platelets: 198 10*3/uL (ref 140–400)
RBC: 4.34 10*6/uL (ref 4.20–5.80)
RDW: 12.5 % (ref 11.0–15.0)
Total Lymphocyte: 25 %
WBC: 9 10*3/uL (ref 3.8–10.8)

## 2019-08-09 LAB — PSA: PSA: 2.7 ng/mL (ref <=4.0)

## 2019-08-09 LAB — TSH: TSH: 1.63 mIU/L (ref 0.40–4.50)

## 2019-08-09 NOTE — Telephone Encounter (Signed)
-----   Message from Maryruth Hancock, MD sent at 08/09/2019  8:38 AM EDT ----- Your renal function is abnormal and worsened since your last blood work. We need to consider a change in your therapy.  Please schedule an appointment to discuss your labwork and a charge in medication

## 2019-08-10 ENCOUNTER — Telehealth: Payer: Self-pay | Admitting: Emergency Medicine

## 2019-08-10 ENCOUNTER — Ambulatory Visit: Payer: Medicare Other | Attending: Internal Medicine

## 2019-08-10 DIAGNOSIS — Z23 Encounter for immunization: Secondary | ICD-10-CM

## 2019-08-10 NOTE — Progress Notes (Signed)
   [id number]  Name:  Koua Growney.    MRN: [id number] DOB: [birth year]  08/10/2019  Mr. Vroom was observed post Covid-19 immunization for 15 minutes without incident. He was provided with Vaccine Information Sheet and instruction to access the V-Safe system.   Mr. Gerren was instructed to call 911 with any severe reactions post vaccine: Marland Kitchen Difficulty breathing  . Swelling of face and throat  . A fast heartbeat  . A bad rash all over body  . Dizziness and weakness   Immunizations Administered    Name Date Dose VIS Date Route   Moderna COVID-19 Vaccine 08/10/2019 10:18 AM 0.5 mL 04/26/2019 Intramuscular   Manufacturer: Moderna   Lot: [id number]   PittBE:[id number]

## 2019-08-10 NOTE — Telephone Encounter (Signed)
-----   Message from Maryruth Hancock, MD sent at 08/09/2019  8:38 AM EDT ----- Your renal function is abnormal and worsened since your last blood work. We need to consider a change in your therapy.  Please schedule an appointment to discuss your labwork and a charge in medication

## 2019-08-10 NOTE — Telephone Encounter (Signed)
Patient wife was informed to have patient call our office to schedule an office visit to discuss labs

## 2019-08-11 ENCOUNTER — Telehealth: Payer: Self-pay | Admitting: Family Medicine

## 2019-08-11 ENCOUNTER — Ambulatory Visit (INDEPENDENT_AMBULATORY_CARE_PROVIDER_SITE_OTHER): Payer: Medicare Other | Admitting: Family Medicine

## 2019-08-11 DIAGNOSIS — Z712 Person consulting for explanation of examination or test findings: Secondary | ICD-10-CM

## 2019-08-11 MED ORDER — LISINOPRIL 40 MG PO TABS: 40.0000 mg | ORAL_TABLET | Freq: Every day | ORAL | 3 refills | Status: DC

## 2019-08-11 NOTE — Telephone Encounter (Signed)
Discussed labwork with pt by telephone-switched medication form combo Zestril/HCTZ to Zestril with NO HCTZ F/u for labwork -concern for worsening renal function

## 2019-08-18 ENCOUNTER — Encounter: Payer: Self-pay | Admitting: Physician Assistant

## 2019-08-18 ENCOUNTER — Ambulatory Visit (INDEPENDENT_AMBULATORY_CARE_PROVIDER_SITE_OTHER): Payer: Medicare Other | Admitting: Physician Assistant

## 2019-08-18 VITALS — BP 140/76 | HR 82 | Temp 98.2°F | Ht 69.5 in | Wt 203.6 lb

## 2019-08-18 DIAGNOSIS — I10 Essential (primary) hypertension: Secondary | ICD-10-CM | POA: Diagnosis not present

## 2019-08-18 DIAGNOSIS — E875 Hyperkalemia: Secondary | ICD-10-CM

## 2019-08-18 DIAGNOSIS — I2510 Atherosclerotic heart disease of native coronary artery without angina pectoris: Secondary | ICD-10-CM

## 2019-08-18 DIAGNOSIS — E785 Hyperlipidemia, unspecified: Secondary | ICD-10-CM

## 2019-08-18 NOTE — Progress Notes (Signed)
Cardiology Office Note   Date:  08/18/2019   ID:  Kyle Dieleman., DOB 1945/08/02, MRN [id number]  PCP:  Maryruth Hancock, MD Cardiologist:  Kate Sable, MD 02/16/2019 [facility] MD: Dr Pixie Casino Electrphysiologist: None Rosaria Ferries, PA-C   No chief complaint on file.   History of Present Illness: Kyle Ballerini. is a 74 y.o. male with a history of NSTEMI 2018 s/p PTCA dLAD, HLD w/ statin intol, HTN, OA/AVN hips, PE 2013, COPD, neuropathy  Kyle Patel. presents for cardiology follow up.  He has neuropathy in his feet, gets pain with walking. This is the main thing that limits his exertion.   He was raking in the yard the other day and got some SOB and fatigue, but no chest pain.  This is the most strenuous thing he has done recently.  He is able to do the treadmill 18" at level 2. He does not get chest pain or SOB with this. He tolerates the treadmill better than walking outside.   He occasionally has L chest pain, aching, it started at rest, lasted < 10".  He has not gotten this with exertion.  He cannot remember exactly when it happened, and says it was mild, unable to rate it further.  It resolved without intervention.  Has never used nitro.   He is a Futures trader, gets some daytime LE edema, does not wake with it.   Dr Debara Pickett put him back on Crestor 10 mg qd. He is tolerating this, occasional muscle cramps. Bananas help the cramping. He has been eating 1 a day. However, K+ was 5.3 on 08/08/2019.   He has been monitoring his BP. It was 109/60, 139/64, 148/?, 148/68, today 140/76. The higher readings are in the evening.     Past Medical History:  Diagnosis Date  . Arthritis   . 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 CAD (coronary artery disease)    a. s/p NSTEMI in 03/2017 with 100% distal LAD stenosis treated with POBA as stent could not be delivered  . COPD (chronic obstructive pulmonary disease) ([place])   . Hereditary and  idiopathic peripheral neuropathy 05/31/2014  . Hypercholesteremia   . Hypertension   . Pulmonary embolism ([place]) 10/2011    Past Surgical History:  Procedure Laterality Date  . ABDOMINAL SURGERY     colon polyp removed 2007- sepsis, coma for 4 months per pt  . COLONOSCOPY  03/22/2010   RMR: 1. Normal rectum 2. Diminutive polyp at the mouth of the ileocecal valve  status post cold biopsy removal. Remaideer of the colonic mucosa and terminal ileum mucosa appeared normal.   . COLONOSCOPY N/A 06/25/2015   Procedure: COLONOSCOPY;  Surgeon: Daneil Dolin, MD;  Location: [facility];  Service: Endoscopy;  Laterality: N/A;  200-rescheduled 1/30 per [place]   . CORONARY BALLOON ANGIOPLASTY N/A 04/24/2017   Procedure: CORONARY BALLOON ANGIOPLASTY;  Surgeon: Jettie Booze, MD;  Location: [facility];  Service: Cardiovascular;  Laterality: N/A;  . LEFT HEART CATH AND CORONARY ANGIOGRAPHY N/A 04/24/2017   Procedure: LEFT HEART CATH AND CORONARY ANGIOGRAPHY;  Surgeon: Jettie Booze, MD;  Location: [place] [facility];  Service: Cardiovascular;  Laterality: N/A;  . LUMBAR LAMINECTOMY  06/02/2012  . LUMBAR LAMINECTOMY/DECOMPRESSION MICRODISCECTOMY  06/02/2012   Procedure: LUMBAR LAMINECTOMY/DECOMPRESSION MICRODISCECTOMY 1 LEVEL;  Surgeon: Floyce Stakes, MD;  Location: [facility];  Service: Neurosurgery;  Laterality:  Bilateral;  Bilateral Lumbar five-sacral one Foraminotomy and microdiskectomy     Current Outpatient Medications  Medication Sig Dispense Refill  . aspirin 81 MG EC tablet Take 81 mg by mouth daily.    . Coenzyme Q10-Vitamin E (QUNOL ULTRA COQ10) 100-150 MG-UNIT CAPS Take 1 capsule by mouth at bedtime.    Marland Kitchen ezetimibe (ZETIA) 10 MG tablet Take 1 tablet (10 mg total) by mouth daily. 90 tablet 3  . lisinopril (ZESTRIL) 40 MG tablet Take 1 tablet (40 mg total) by mouth daily. 30 tablet 3  . metoprolol succinate (TOPROL-XL) 25 MG 24 hr tablet TAKE (1) TABLET BY MOUTH ONCE  DAILY. 90 tablet 3  . Misc Natural Products (OSTEO BI-FLEX ADV JOINT SHIELD) TABS Take 1 tablet by mouth daily.    . Multiple Vitamins-Minerals (CENTRUM PO) Take 1 tablet by mouth every morning.    . nitroGLYCERIN (NITROSTAT) 0.4 MG SL tablet PLACE 1 TAB UNDER TONGUE EVERY 5 MIN IF NEEDED FOR CHEST PAIN. MAY USE 3 TIMES.NO RELIEF CALL 911. 25 tablet 0  . pregabalin (LYRICA) 150 MG capsule Take one daily at bedtime 30 capsule 3  . rosuvastatin (CRESTOR) 10 MG tablet Take 1 tablet (10 mg total) by mouth daily. 30 tablet 5   No current facility-administered medications for this visit.    Allergies:   Levaquin [levofloxacin] and Statins    Social History:  The patient  reports that he quit smoking about 15 years ago. His smoking use included cigarettes. He has a 2.00 pack-year smoking history. He has never used smokeless tobacco. He reports that he does not drink alcohol or use drugs.   Family History:  The patient's family history includes Arthritis in an other family member; Colon cancer (age of onset: 80) in his sister; Diabetes in his mother; Heart attack in his father and mother; Stroke in his father.  He indicated that his mother is deceased. He indicated that his father is deceased. He indicated that three of his four sisters are alive. He indicated that his brother is deceased. He indicated that his maternal grandmother is deceased. He indicated that his maternal grandfather is deceased. He indicated that his paternal grandmother is deceased. He indicated that his paternal grandfather is deceased. He indicated that the status of his neg hx is unknown. He indicated that the status of his other is unknown.    ROS:  Please see the history of present illness. All other systems are reviewed and negative.    PHYSICAL EXAM: VS:  BP 140/76   Pulse 82   Temp 98.2 F (36.8 C)   Ht 5' 9.5" (1.765 m)   Wt 203 lb 9.6 oz (92.4 kg)   SpO2 97%   BMI 29.64 kg/m  , BMI Body mass index is 29.64  kg/m. GEN: Well nourished, well developed, male in no acute distress HEENT: normal for age  Neck: no JVD, no carotid bruit, no masses Cardiac: RRR; soft murmur, no rubs, or gallops Respiratory:  clear to auscultation bilaterally, normal work of breathing GI: soft, nontender, nondistended, + BS MS: no deformity or atrophy; trace lower extremity edema; distal pulses are 2+ in all 4 extremities  Skin: warm and dry, no rash Neuro:  Strength and sensation are intact Psych: euthymic mood, full affect   EKG:  EKG is not ordered today.  ECHO: 11/16/2018 1. The left ventricle has normal systolic function with an ejection  fraction of 60-65%. The cavity size was normal. There is mild concentric  left  ventricular hypertrophy. Left ventricular diastolic Doppler  parameters are consistent with impaired  relaxation. Indeterminate filling pressures No evidence of left  ventricular regional wall motion abnormalities.  2. The mitral valve is grossly normal.  3. The tricuspid valve is grossly normal.  4. The aortic valve is tricuspid. Mild thickening of the aortic valve.  5. The aortic root is normal in size and structure.  6. The inferior vena cava was dilated in size with >50% respiratory  variability.   CATH: 04/24/2017  Dist LAD lesion is 100% stenosed. Wire dissection at the lesion due to severe tortuosity. Initial TIMI 0 flow improved to TIMI2 flow after balloon inflations. Chest pain improved at the end of the case. I don't think stent could be delivered.  Mid RCA lesion is 10% stenosed.  LV end diastolic pressure is normal.  There is no aortic valve stenosis.  Balloon angioplasty was performed using a BALLOON MAVERICK OTW 2.0X15.  Post intervention, there is a 90% residual stenosis.    Recent Labs: 08/08/2019: ALT 18; BUN 23; Creat 1.77; Hemoglobin 13.4; Platelets 198; Potassium 5.3; Sodium 141; TSH 1.63  CBC    Component Value Date/Time   WBC 9.0 08/08/2019 1238   RBC  4.34 08/08/2019 1238   HGB 13.4 08/08/2019 1238   HCT 40.3 08/08/2019 1238   PLT 198 08/08/2019 1238   MCV 92.9 08/08/2019 1238   MCH 30.9 08/08/2019 1238   MCHC 33.3 08/08/2019 1238   RDW 12.5 08/08/2019 1238   LYMPHSABS 2,250 08/08/2019 1238   MONOABS 1.7 (H) 11/10/2011 2024   EOSABS 135 08/08/2019 1238   BASOSABS 27 08/08/2019 1238   CMP Latest Ref Rng & Units 08/08/2019 05/14/2018 05/06/2018  Glucose 65 - 99 mg/dL 74 102(H) 111(H)  BUN 7 - 25 mg/dL 23 20 24(H)  Creatinine 0.70 - 1.18 mg/dL 1.77(H) 1.45(H) 1.50(H)  Sodium 135 - 146 mmol/L 141 137 142  Potassium 3.5 - 5.3 mmol/L 5.3 4.2 4.7  Chloride 98 - 110 mmol/L 113(H) 108 110  CO2 20 - 32 mmol/L 21 23 25   Calcium 8.6 - 10.3 mg/dL 9.8 8.9 9.5  Total Protein 6.1 - 8.1 g/dL 7.3 7.6 -  Total Bilirubin 0.2 - 1.2 mg/dL 0.4 0.6 -  Alkaline Phos 38 - 126 U/L - 56 -  AST 10 - 35 U/L 26 34 -  ALT 9 - 46 U/L 18 24 -     Lipid Panel Lab Results  Component Value Date   CHOL 238 (H) 07/04/2019   HDL 53 07/04/2019   LDLCALC 163 (H) 07/04/2019   TRIG 124 07/04/2019   CHOLHDL 4.5 07/04/2019      Wt Readings from Last 3 Encounters:  08/18/19 203 lb 9.6 oz (92.4 kg)  08/08/19 201 lb (91.2 kg)  07/15/19 202 lb (91.6 kg)     Other studies Reviewed: Additional studies/ records that were reviewed today include: Office notes, hospital records and testing.  ASSESSMENT AND PLAN:  1.  CAD:  -Continue ASA, beta-blocker, statin -He is encouraged to increase activity as his neuropathy will allow.  He may be able to do this by doing shorter intervals on the treadmill -No ischemic symptoms, no testing at this time -I reviewed his cath diagram with him and explained that because of the small vessel that they were able to balloon but not stent, he could have some angina.  He understands that if he gets chest pain that is not severe and resolves quickly or resolves with 1 nitro, he  should let us know but not worry about it.  He was also  advised that if he gets more severe chest pain or it does not resolve with nitro, call 911  2.  Hypertension: -His blood pressure readings that were above target, were all in the evening. -He is encouraged to take the Toprol-XL in the evening, start tonight. -If this does not control his blood pressure, increase the metoprolol to twice daily.  He is not bradycardic.  3. Hyperlipidemia: -He is tolerating the Crestor 10 mg daily and the Zetia -Keep follow-up appointments and get labs as scheduled  4.  Hyperkalemia -He was having cramping with the statins, but this got better when he started eating a banana a day. -His last potassium level was 5.3, he is requested to cut the bananas back to every other day.   Current medicines are reviewed at length with the patient today.  The patient does not have concerns regarding medicines.  The following changes have been made: Start taking the Toprol-XL at bedtime  Labs/ tests ordered today include:  No orders of the defined types were placed in this encounter.    Disposition:   FU with Kate Sable, MD  Signed, Rosaria Ferries, PA-C  08/18/2019 3:04 PM    [place] Phone: [phone]; Fax: [phone]

## 2019-08-18 NOTE — Patient Instructions (Signed)
Medication Instructions:  Your physician recommends that you continue on your current medications as directed. Please refer to the Current Medication list given to you today.  Take your Metoprolol when you take your Crestor in the evening.  Continue to record your blood pressure with date and time.   *If you need a refill on your cardiac medications before your next appointment, please call your pharmacy*   Lab Work: NONE  If you have labs (blood work) drawn today and your tests are completely normal, you will receive your results only by: Marland Kitchen MyChart Message (if you have MyChart) OR . A paper copy in the mail If you have any lab test that is abnormal or we need to change your treatment, we will call you to review the results.   Testing/Procedures: NONE    Follow-Up: At [facility], you and your health needs are our priority.  As part of our continuing mission to provide you with exceptional heart care, we have created designated Provider Care Teams.  These Care Teams include your primary Cardiologist (physician) and Advanced Practice Providers (APPs -  Physician Assistants and Nurse Practitioners) who all work together to provide you with the care you need, when you need it.  We recommend signing up for the patient portal called "MyChart".  Sign up information is provided on this After Visit Summary.  MyChart is used to connect with patients for Virtual Visits (Telemedicine).  Patients are able to view lab/test results, encounter notes, upcoming appointments, etc.  Non-urgent messages can be sent to your provider as well.   To learn more about what you can do with MyChart, go to [email or web address].    Your next appointment:   4 month(s)  The format for your next appointment:   In Person  Provider:   Kate Sable, MD   Other Instructions .Thank you for choosing [facility]!

## 2019-09-05 ENCOUNTER — Ambulatory Visit: Payer: Medicare Other | Admitting: Family Medicine

## 2019-12-15 ENCOUNTER — Other Ambulatory Visit: Payer: Self-pay | Admitting: Family Medicine

## 2020-01-09 ENCOUNTER — Other Ambulatory Visit (HOSPITAL_COMMUNITY): Payer: Self-pay | Admitting: Internal Medicine

## 2020-01-09 ENCOUNTER — Other Ambulatory Visit: Payer: Self-pay | Admitting: Internal Medicine

## 2020-01-09 DIAGNOSIS — N179 Acute kidney failure, unspecified: Secondary | ICD-10-CM

## 2020-01-12 ENCOUNTER — Telehealth: Payer: Self-pay | Admitting: Student

## 2020-01-12 DIAGNOSIS — I2510 Atherosclerotic heart disease of native coronary artery without angina pectoris: Secondary | ICD-10-CM

## 2020-01-12 NOTE — Telephone Encounter (Signed)
New message     /Dr Theador Hawthorne office called , [facility] is coming in to see [place] on 01/20/20 and they are requesting that patient have a echocardiogram please

## 2020-01-12 NOTE — Telephone Encounter (Signed)
I spoke with patient.He prefers to wait to schedule echo apt until he is seen on 01/20/20

## 2020-01-12 NOTE — Telephone Encounter (Signed)
Wife will have pt call back.Order placed for echo.

## 2020-01-13 ENCOUNTER — Ambulatory Visit: Payer: Medicare Other | Admitting: Cardiovascular Disease

## 2020-01-16 ENCOUNTER — Ambulatory Visit (HOSPITAL_COMMUNITY)
Admission: RE | Admit: 2020-01-16 | Discharge: 2020-01-16 | Disposition: A | Discharge status: Home / Self Care | Payer: Medicare Other | Source: Ambulatory Visit | Attending: Internal Medicine | Admitting: Internal Medicine

## 2020-01-16 ENCOUNTER — Other Ambulatory Visit: Payer: Self-pay

## 2020-01-16 DIAGNOSIS — N179 Acute kidney failure, unspecified: Secondary | ICD-10-CM | POA: Diagnosis not present

## 2020-01-17 ENCOUNTER — Other Ambulatory Visit: Payer: Self-pay | Admitting: Student

## 2020-01-20 ENCOUNTER — Encounter: Payer: Self-pay | Admitting: Student

## 2020-01-20 ENCOUNTER — Ambulatory Visit: Payer: Medicare Other | Admitting: Student

## 2020-01-20 ENCOUNTER — Other Ambulatory Visit: Payer: Self-pay

## 2020-01-20 VITALS — BP 122/70 | HR 72 | Ht 69.5 in | Wt 201.0 lb

## 2020-01-20 DIAGNOSIS — I2510 Atherosclerotic heart disease of native coronary artery without angina pectoris: Secondary | ICD-10-CM | POA: Diagnosis not present

## 2020-01-20 DIAGNOSIS — R0609 Other forms of dyspnea: Secondary | ICD-10-CM

## 2020-01-20 DIAGNOSIS — E785 Hyperlipidemia, unspecified: Secondary | ICD-10-CM | POA: Diagnosis not present

## 2020-01-20 DIAGNOSIS — I10 Essential (primary) hypertension: Secondary | ICD-10-CM

## 2020-01-20 DIAGNOSIS — N183 Chronic kidney disease, stage 3 unspecified: Secondary | ICD-10-CM

## 2020-01-20 DIAGNOSIS — R0600 Dyspnea, unspecified: Secondary | ICD-10-CM

## 2020-01-20 MED ORDER — ROSUVASTATIN CALCIUM 10 MG PO TABS: 10.0000 mg | ORAL_TABLET | Freq: Every day | ORAL | 3 refills | Status: DC

## 2020-01-20 MED ORDER — LISINOPRIL 40 MG PO TABS: 40.0000 mg | ORAL_TABLET | Freq: Every day | ORAL | 3 refills | Status: DC

## 2020-01-20 MED ORDER — FUROSEMIDE 20 MG PO TABS
20.0000 mg | ORAL_TABLET | Freq: Every day | ORAL | 3 refills | Status: DC
Start: 2020-01-20 — End: 2020-07-20

## 2020-01-20 NOTE — Progress Notes (Signed)
Cardiology Office Note    Date:  01/21/2020  ID:  Kyle Patel., DOB 1945-09-03, MRN [id number]  PCP:  Celene Squibb, MD  Cardiologist: Kate Sable, MD (Inactive)  --> will switch to Dr. Domenic Polite  Chief Complaint  Patient presents with  . Follow-up    6 month visit    History of Present Illness:    Kyle Patel. is a 74 y.o. male with past medical history of CAD (s/p NSTEMI in 03/2017 with 100% distal LAD stenosis treated with POBA as stent could not be delivered), HTN, HLD, Stage 3 CKD and history of PE who presents to the office today for 59-month follow-up.  He was last examined by Rosaria Ferries, PA-C in 07/2019 and reported neuropathy in his feet but had been walking on the treadmill without anginal symptoms. He had restarted Crestor 10 mg daily and was overall tolerating this well. He was continued on his current medication regimen at that time including ASA 81 mg daily, Zetia 10 mg daily, Lisinopril 40 mg daily, Toprol-XL 25 mg daily and Crestor 10 mg daily.  In talking with the patient today, he reports overall doing well from a cardiac perspective since his last visit. He does have baseline dyspnea on exertion but denies any associated exertional chest pain or palpitations. No recent orthopnea or PND. He is now being followed by Dr. Theador Hawthorne for his CKD and is confused about his current medication regimen as he has prescription pill bottles for Lisinopril-HCTZ, Lisinopril 40 mg daily and Lasix 20 mg daily. He says that he was informed by Nephrology to stop Lisinopril-HCTZ but has remained on this.   Past Medical History:  Diagnosis Date  . Arthritis   . 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 CAD (coronary artery disease)    a. s/p NSTEMI in 03/2017 with 100% distal LAD stenosis treated with POBA as stent could not be delivered  . COPD (chronic obstructive pulmonary disease) ([place])   . 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  Hypercholesteremia   . Hypertension   . Pulmonary embolism ([place]) 10/2011    Past Surgical History:  Procedure Laterality Date  . ABDOMINAL SURGERY     colon polyp removed 2007- sepsis, coma for 4 months per pt  . COLONOSCOPY  03/22/2010   RMR: 1. Normal rectum 2. Diminutive polyp at the mouth of the ileocecal valve  status post cold biopsy removal. Remaideer of the colonic mucosa and terminal ileum mucosa appeared normal.   . COLONOSCOPY N/A 06/25/2015   Procedure: COLONOSCOPY;  Surgeon: Daneil Dolin, MD;  Location: [facility];  Service: Endoscopy;  Laterality: N/A;  200-rescheduled 1/30 per [place]   . CORONARY BALLOON ANGIOPLASTY N/A 04/24/2017   Procedure: CORONARY BALLOON ANGIOPLASTY;  Surgeon: Jettie Booze, MD;  Location: [facility];  Service: Cardiovascular;  Laterality: N/A;  . LEFT HEART CATH AND CORONARY ANGIOGRAPHY N/A 04/24/2017   Procedure: LEFT HEART CATH AND CORONARY ANGIOGRAPHY;  Surgeon: Jettie Booze, MD;  Location: [place] [facility];  Service: Cardiovascular;  Laterality: N/A;  . LUMBAR LAMINECTOMY  06/02/2012  . LUMBAR LAMINECTOMY/DECOMPRESSION MICRODISCECTOMY  06/02/2012   Procedure: LUMBAR LAMINECTOMY/DECOMPRESSION MICRODISCECTOMY 1 LEVEL;  Surgeon: Floyce Stakes, MD;  Location: [facility];  Service: Neurosurgery;  Laterality: Bilateral;  Bilateral Lumbar five-sacral one Foraminotomy and microdiskectomy     Current Medications: Outpatient Medications Prior to Visit  Medication Sig Dispense Refill  .  aspirin 81 MG EC tablet Take 81 mg by mouth daily.    . Coenzyme Q10-Vitamin E (QUNOL ULTRA COQ10) 100-150 MG-UNIT CAPS Take 1 capsule by mouth at bedtime.    Marland Kitchen ezetimibe (ZETIA) 10 MG tablet Take 1 tablet (10 mg total) by mouth daily. 90 tablet 3  . metoprolol succinate (TOPROL-XL) 25 MG 24 hr tablet TAKE (1) TABLET BY MOUTH ONCE DAILY. 90 tablet 3  . Misc Natural Products (OSTEO BI-FLEX ADV JOINT SHIELD) TABS Take 1 tablet by mouth daily.     . Multiple Vitamins-Minerals (CENTRUM PO) Take 1 tablet by mouth every morning.    . pregabalin (LYRICA) 150 MG capsule Take one daily at bedtime 30 capsule 3  . lisinopril (ZESTRIL) 40 MG tablet Take 1 tablet (40 mg total) by mouth daily. 30 tablet 3  . rosuvastatin (CRESTOR) 10 MG tablet Take 1 tablet (10 mg total) by mouth daily. 30 tablet 5  . nitroGLYCERIN (NITROSTAT) 0.4 MG SL tablet PLACE 1 TAB UNDER TONGUE EVERY 5 MIN IF NEEDED FOR CHEST PAIN. MAY USE 3 TIMES.NO RELIEF CALL 911. 25 tablet 0   No facility-administered medications prior to visit.     Allergies:   Levaquin [levofloxacin] and Statins   Social History   Socioeconomic History  . Marital status: Married    Spouse name: Not on file  . Number of children: 1  . Years of education: Not on file  . Highest education level: Not on file  Occupational History  . Occupation: retired  Tobacco Use  . Smoking status: Former Smoker    Packs/day: 0.10    Years: 20.00    Pack years: 2.00    Types: Cigarettes    Quit date: 04/25/2004    Years since quitting: 15.7  . Smokeless tobacco: Never Used  Vaping Use  . Vaping Use: Never used  Substance and Sexual Activity  . Alcohol use: No  . Drug use: No  . Sexual activity: Not on file  Other Topics Concern  . Not on file  Social History Narrative   Patient is right handed.   Patient drinks caffeine occasionally   Social Determinants of Health   Financial Resource Strain:   . Difficulty of Paying Living Expenses: Not on file  Food Insecurity:   . Worried About Charity fundraiser in the Last Year: Not on file  . Ran Out of Food in the Last Year: Not on file  Transportation Needs:   . Lack of Transportation (Medical): Not on file  . Lack of Transportation (Non-Medical): Not on file  Physical Activity:   . Days of Exercise per Week: Not on file  . Minutes of Exercise per Session: Not on file  Stress:   . Feeling of Stress : Not on file  Social Connections:   .  Frequency of Communication with Friends and Family: Not on file  . Frequency of Social Gatherings with Friends and Family: Not on file  . Attends Religious Services: Not on file  . Active Member of Clubs or Organizations: Not on file  . Attends Archivist Meetings: Not on file  . Marital Status: Not on file     Family History:  The patient's family history includes Arthritis in an other family member; Colon cancer (age of onset: 54) in his sister; Diabetes in his mother; Heart attack in his father and mother; Stroke in his father.   Review of Systems:   Please see the history of present illness.  General:  No chills, fever, night sweats or weight changes.  Cardiovascular:  No chest pain, edema, orthopnea, palpitations, paroxysmal nocturnal dyspnea. Positive for dyspnea on exertion (at baseline).  Dermatological: No rash, lesions/masses Respiratory: No cough, dyspnea Urologic: No hematuria, dysuria Abdominal:   No nausea, vomiting, diarrhea, bright red blood per rectum, melena, or hematemesis Neurologic:  No visual changes, wkns, changes in mental status. All other systems reviewed and are otherwise negative except as noted above.   Physical Exam:    VS:  BP 122/70   Pulse 72   Ht 5' 9.5" (1.765 m)   Wt 201 lb (91.2 kg)   SpO2 96%   BMI 29.26 kg/m    General: Well developed, well nourished,male appearing in no acute distress. Head: Normocephalic, atraumatic. Neck: No carotid bruits. JVD not elevated.  Lungs: Respirations regular and unlabored, without wheezes or rales.  Heart: Regular rate and rhythm. No S3 or S4.  No murmur, no rubs, or gallops appreciated. Abdomen: Appears non-distended. No obvious abdominal masses. Msk:  Strength and tone appear normal for age. No obvious joint deformities or effusions. Extremities: No clubbing or cyanosis. Trace lower extremity edema bilaterally.  Distal pedal pulses are 2+ bilaterally. Neuro: Alert and oriented X 3. Moves all  extremities spontaneously. No focal deficits noted. Psych:  Responds to questions appropriately with a normal affect. Skin: No rashes or lesions noted  Wt Readings from Last 3 Encounters:  01/20/20 201 lb (91.2 kg)  08/18/19 203 lb 9.6 oz (92.4 kg)  08/08/19 201 lb (91.2 kg)     Studies/Labs Reviewed:   EKG:  EKG is ordered today. 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  No acute ST abnormalities were compared to prior tracings.  Recent Labs: 08/08/2019: ALT 18; BUN 23; Creat 1.77; Hemoglobin 13.4; Platelets 198; Potassium 5.3; Sodium 141; TSH 1.63   Lipid Panel    Component Value Date/Time   CHOL 238 (H) 07/04/2019 1501   TRIG 124 07/04/2019 1501   HDL 53 07/04/2019 1501   CHOLHDL 4.5 07/04/2019 1501   CHOLHDL 3.1 05/14/2018 0924   VLDL 15 05/14/2018 0924   LDLCALC 163 (H) 07/04/2019 1501    Additional studies/ records that were reviewed today include:   Echocardiogram: 10/2018 IMPRESSIONS    1. The left ventricle has normal systolic function with an ejection  fraction of 60-65%. The cavity size was normal. There is mild concentric  left ventricular hypertrophy. Left ventricular diastolic Doppler  parameters are consistent with impaired  relaxation. Indeterminate filling pressures No evidence of left  ventricular regional wall motion abnormalities.  2. The mitral valve is grossly normal.  3. The tricuspid valve is grossly normal.  4. The aortic valve is tricuspid. Mild thickening of the aortic valve.  5. The aortic root is normal in size and structure.  6. The inferior vena cava was dilated in size with >50% respiratory  variability.   Assessment:    1. Coronary artery disease involving native coronary artery of native heart without angina pectoris   2. DOE (dyspnea on exertion)   3. Essential hypertension   4. Hyperlipidemia LDL goal <70   5. Stage 3 chronic kidney disease, unspecified whether stage 3a or  3b CKD      Plan:   In order of problems listed above:  1. CAD/Dyspnea on Exertion - He is s/p NSTEMI in 03/2017 with 100% distal LAD stenosis treated with POBA as stent could not be delivered.  He denies any recent chest pain but does have dyspnea on exertion which has been at baseline. Nephrology recently requested a repeat echocardiogram and this will be ordered for assessment of any structural abnormalities.  - Continue ASA 81mg  daily, Toprol-XL 25mg  daily, Crestor 10mg  daily and Zetia 10mg  daily.   2. HTN - BP is well-controlled at 122/70 during today's visit. There is some confusion surrounding his medications and he has been taking Lisinopril-HCTZ, Lisinopril by itself and Lasix. I went through today and labeled the medications he should not be taking as Lisinopril-HCTZ was discontinued by Nephrology. Remains on Lisinopril 40mg  daily, Toprol-XL 25mg  daily and Lasix 20mg  daily.   3. HLD - Followed by his PCP. Reports having labs last week and I will request a copy of these. He was previously intolerant to high-intensity statin therapy but has tolerated Crestor 10mg  daily. Continue this along with Zetia 10mg  daily.   4. Stage 3 CKD - Creatinine was at 1.77 in 07/2019. Will request his most recent labs as outlined above. Followed by Dr. Theador Hawthorne.     Medication Adjustments/Labs and Tests Ordered: Current medicines are reviewed at length with the patient today.  Concerns regarding medicines are outlined above.  Medication changes, Labs and Tests ordered today are listed in the Patient Instructions below. Patient Instructions  Medication Instructions:  Your physician has recommended you make the following change in your medication:  Stop Taking Lisinopril-HCTZ  Start Taking Lisinopril 40 mg Daily  Start Taking Lasix 20 mg Daily   *If you need a refill on your cardiac medications before your next appointment, please call your pharmacy*   Lab Work: NONE   If you have labs (blood  work) drawn today and your tests are completely normal, you will receive your results only by: Marland Kitchen MyChart Message (if you have MyChart) OR . A paper copy in the mail If you have any lab test that is abnormal or we need to change your treatment, we will call you to review the results.   Testing/Procedures: Your physician has requested that you have an echocardiogram. Echocardiography is a painless test that uses sound waves to create images of your heart. It provides your doctor with information about the size and shape of your heart and how well your heart's chambers and valves are working. This procedure takes approximately one hour. There are no restrictions for this procedure.     Follow-Up: At [facility], you and your health needs are our priority.  As part of our continuing mission to provide you with exceptional heart care, we have created designated Provider Care Teams.  These Care Teams include your primary Cardiologist (physician) and Advanced Practice Providers (APPs -  Physician Assistants and Nurse Practitioners) who all work together to provide you with the care you need, when you need it.  We recommend signing up for the patient portal called "MyChart".  Sign up information is provided on this After Visit Summary.  MyChart is used to connect with patients for Virtual Visits (Telemedicine).  Patients are able to view lab/test results, encounter notes, upcoming appointments, etc.  Non-urgent messages can be sent to your provider as well.   To learn more about what you can do with MyChart, go to [email or web address].    Your next appointment:   6 month(s)  The format for your next appointment:   In Person  Provider:   Rozann Lesches, MD   Other Instructions Thank you for choosing [facility]!  Signed, Erma Heritage, PA-C  01/21/2020 8:42 AM    [facility] [place] [address] [place], [place] [address] Phone: [phone] Fax: [phone]

## 2020-01-20 NOTE — Patient Instructions (Signed)
Medication Instructions:  Your physician has recommended you make the following change in your medication:  Stop Taking Lisinopril-HCTZ  Start Taking Lisinopril 40 mg Daily  Start Taking Lasix 20 mg Daily   *If you need a refill on your cardiac medications before your next appointment, please call your pharmacy*   Lab Work: NONE   If you have labs (blood work) drawn today and your tests are completely normal, you will receive your results only by:  [place] (if you have MyChart) OR  A paper copy in the mail If you have any lab test that is abnormal or we need to change your treatment, we will call you to review the results.   Testing/Procedures: Your physician has requested that you have an echocardiogram. Echocardiography is a painless test that uses sound waves to create images of your heart. It provides your doctor with information about the size and shape of your heart and how well your hearts chambers and valves are working. This procedure takes approximately one hour. There are no restrictions for this procedure.     Follow-Up: At [facility], you and your health needs are our priority.  As part of our continuing mission to provide you with exceptional heart care, we have created designated Provider Care Teams.  These Care Teams include your primary Cardiologist (physician) and Advanced Practice Providers (APPs -  Physician Assistants and Nurse Practitioners) who all work together to provide you with the care you need, when you need it.  We recommend signing up for the patient portal called "MyChart".  Sign up information is provided on this After Visit Summary.  MyChart is used to connect with patients for Virtual Visits (Telemedicine).  Patients are able to view lab/test results, encounter notes, upcoming appointments, etc.  Non-urgent messages can be sent to your provider as well.   To learn more about what you can do with MyChart, go to [email or web address].     Your next appointment:   6 month(s)  The format for your next appointment:   In Person  Provider:   Rozann Lesches, MD   Other Instructions Thank you for choosing [facility]!

## 2020-01-21 ENCOUNTER — Encounter: Payer: Self-pay | Admitting: Student

## 2020-01-21 MED ORDER — NITROGLYCERIN 0.4 MG SL SUBL: SUBLINGUAL_TABLET | SUBLINGUAL | 3 refills | Status: DC

## 2020-01-23 ENCOUNTER — Ambulatory Visit (HOSPITAL_COMMUNITY)
Admission: RE | Admit: 2020-01-23 | Discharge: 2020-01-23 | Disposition: A | Discharge status: Home / Self Care | Payer: Medicare Other | Source: Ambulatory Visit | Attending: Student | Admitting: Student

## 2020-01-23 DIAGNOSIS — R0609 Other forms of dyspnea: Secondary | ICD-10-CM

## 2020-01-23 DIAGNOSIS — R0600 Dyspnea, unspecified: Secondary | ICD-10-CM | POA: Diagnosis not present

## 2020-01-23 LAB — ECHOCARDIOGRAM COMPLETE
AR max vel: 1.48 cm2
AV Area VTI: 1.37 cm2
AV Area mean vel: 1.41 cm2
AV Mean grad: 3.4 mmHg
AV Peak grad: 6.9 mmHg
Ao pk vel: 1.32 m/s
Area-P 1/2: 4.21 cm2
S' Lateral: 2.74 cm

## 2020-01-23 NOTE — Progress Notes (Signed)
*  PRELIMINARY RESULTS* Echocardiogram 2D Echocardiogram has been performed.  Kyle Patel 01/23/2020, 12:09 PM

## 2020-02-13 ENCOUNTER — Other Ambulatory Visit: Payer: Self-pay | Admitting: Cardiology

## 2020-06-15 DIAGNOSIS — R7303 Prediabetes: Secondary | ICD-10-CM | POA: Insufficient documentation

## 2020-06-27 ENCOUNTER — Encounter: Payer: Self-pay | Admitting: Internal Medicine

## 2020-07-18 ENCOUNTER — Other Ambulatory Visit: Payer: Self-pay | Admitting: Internal Medicine

## 2020-07-20 ENCOUNTER — Encounter: Payer: Self-pay | Admitting: Cardiology

## 2020-07-20 ENCOUNTER — Other Ambulatory Visit: Payer: Self-pay

## 2020-07-20 ENCOUNTER — Ambulatory Visit: Payer: Medicare Other | Admitting: Cardiology

## 2020-07-20 VITALS — BP 138/66 | HR 74 | Ht 69.5 in | Wt 201.0 lb

## 2020-07-20 DIAGNOSIS — R079 Chest pain, unspecified: Secondary | ICD-10-CM | POA: Diagnosis not present

## 2020-07-20 DIAGNOSIS — N1832 Chronic kidney disease, stage 3b: Secondary | ICD-10-CM | POA: Diagnosis not present

## 2020-07-20 DIAGNOSIS — I10 Essential (primary) hypertension: Secondary | ICD-10-CM

## 2020-07-20 DIAGNOSIS — I25119 Atherosclerotic heart disease of native coronary artery with unspecified angina pectoris: Secondary | ICD-10-CM

## 2020-07-20 DIAGNOSIS — E782 Mixed hyperlipidemia: Secondary | ICD-10-CM

## 2020-07-20 NOTE — Patient Instructions (Signed)
Medication Instructions:  Your physician recommends that you continue on your current medications as directed. Please refer to the Current Medication list given to you today.  *If you need a refill on your cardiac medications before your next appointment, please call your pharmacy*   Lab Work: None today If you have labs (blood work) drawn today and your tests are completely normal, you will receive your results only by: Marland Kitchen MyChart Message (if you have MyChart) OR . A paper copy in the mail If you have any lab test that is abnormal or we need to change your treatment, we will call you to review the results.   Testing/Procedures: Your physician has requested that you have a lexiscan myoview. For further information please visit [email or web address]. Please follow instruction sheet, as given.     Follow-Up: At [facility], you and your health needs are our priority.  As part of our continuing mission to provide you with exceptional heart care, we have created designated Provider Care Teams.  These Care Teams include your primary Cardiologist (physician) and Advanced Practice Providers (APPs -  Physician Assistants and Nurse Practitioners) who all work together to provide you with the care you need, when you need it.  We recommend signing up for the patient portal called "MyChart".  Sign up information is provided on this After Visit Summary.  MyChart is used to connect with patients for Virtual Visits (Telemedicine).  Patients are able to view lab/test results, encounter notes, upcoming appointments, etc.  Non-urgent messages can be sent to your provider as well.   To learn more about what you can do with MyChart, go to [email or web address].    Your next appointment:   6 month(s)  The format for your next appointment:   In Person  Provider:   Rozann Lesches, MD   Other Instructions None      Thank you for choosing [facility] !

## 2020-07-20 NOTE — Progress Notes (Addendum)
Cardiology Office Note  Date: 07/20/2020   ID: Kyle Patel., DOB [birth year], MRN [id number]  PCP:  Celene Squibb, MD  Cardiologist:  Rozann Lesches, MD Electrophysiologist:  None   Chief Complaint  Patient presents with  . Cardiac follow-up    History of Present Illness: Kyle Patel. is a 75 y.o. male former patient of Dr. Bronson Ing now presenting to establish follow-up with me.  I reviewed his records and updated the chart.  He was last seen in August 2021 by Ms. Strader PA-C.  He presents today for routine visit.  He states that he has been having recent episodes of a mild, dull discomfort in his left chest, not precipitated by exertion.  Symptoms last anywhere from a few seconds up to a few minutes.  He has not taken any nitroglycerin.  Feels reportedly somewhat different from symptoms back in 2018 with distal LAD intervention.  I reviewed his medications which are outlined below.  He reports compliance.  We are requesting his most recent lab work from Dr. Nevada Crane.  He has nephrology follow-up with Dr. Theador Hawthorne, was seen in January.  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  I personally reviewed his ECG today which shows normal sinus rhythm with right bundle branch block and left anterior fascicular block.  Past Medical History:  Diagnosis Date  . Arthritis   . 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 CAD (coronary artery disease)    a. s/p NSTEMI in 03/2017 with 100% distal LAD stenosis treated with POBA as stent could not be delivered  . COPD (chronic obstructive pulmonary disease) ([place])   . Essential hypertension   . 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 Hyperlipidemia   . Pulmonary embolism ([place]) 10/2011    Past Surgical History:  Procedure Laterality Date  . ABDOMINAL SURGERY     colon polyp removed 2007- sepsis, coma for 4 months per pt  . COLONOSCOPY  03/22/2010   RMR: 1. Normal rectum 2. Diminutive polyp at the mouth of the ileocecal valve  status  post cold biopsy removal. Remaideer of the colonic mucosa and terminal ileum mucosa appeared normal.   . COLONOSCOPY N/A 06/25/2015   Procedure: COLONOSCOPY;  Surgeon: Daneil Dolin, MD;  [facility]: [facility];  Service: Endoscopy;  Laterality: N/A;  200-rescheduled 1/30 per [place]   . CORONARY BALLOON ANGIOPLASTY N/A 04/24/2017   Procedure: CORONARY BALLOON ANGIOPLASTY;  Surgeon: Jettie Booze, MD;  Location: [facility];  Service: Cardiovascular;  Laterality: N/A;  . LEFT HEART CATH AND CORONARY ANGIOGRAPHY N/A 04/24/2017   Procedure: LEFT HEART CATH AND CORONARY ANGIOGRAPHY;  Surgeon: Jettie Booze, MD;  Location: [facility];  Service: Cardiovascular;  Laterality: N/A;  . LUMBAR LAMINECTOMY  06/02/2012  . LUMBAR LAMINECTOMY/DECOMPRESSION MICRODISCECTOMY  06/02/2012   Procedure: LUMBAR LAMINECTOMY/DECOMPRESSION MICRODISCECTOMY 1 LEVEL;  Surgeon: Floyce Stakes, MD;  Location: [facility];  Service: Neurosurgery;  Laterality: Bilateral;  Bilateral Lumbar five-sacral one Foraminotomy and microdiskectomy     Current Outpatient Medications  Medication Sig Dispense Refill  . aspirin 81 MG EC tablet Take 81 mg by mouth daily.    . calcitRIOL (ROCALTROL) 0.25 MCG capsule Take 0.25 mcg by mouth 3 (three) times a week.    . Coenzyme Q10-Vitamin E 100-150 MG-UNIT CAPS Take 1 capsule by mouth at bedtime.    Marland Kitchen ezetimibe (ZETIA) 10 MG tablet TAKE (1) TABLET BY MOUTH ONCE DAILY. 90 tablet 1  .  furosemide (LASIX) 20 MG tablet Take 20 mg by mouth 2 (two) times daily.    Marland Kitchen lisinopril (ZESTRIL) 40 MG tablet Take 1 tablet (40 mg total) by mouth daily. 90 tablet 3  . losartan (COZAAR) 25 MG tablet Take 12.5 mg by mouth daily.    . metoprolol succinate (TOPROL-XL) 25 MG 24 hr tablet TAKE (1) TABLET BY MOUTH ONCE DAILY. 90 tablet 3  . Misc Natural Products (OSTEO BI-FLEX ADV JOINT SHIELD) TABS Take 1 tablet by mouth daily.    . Multiple Vitamins-Minerals (CENTRUM PO) Take 1 tablet  by mouth every morning.    . nitroGLYCERIN (NITROSTAT) 0.4 MG SL tablet PLACE 1 TAB UNDER TONGUE EVERY 5 MIN IF NEEDED FOR CHEST PAIN. MAY USE 3 TIMES.NO RELIEF CALL 911. 25 tablet 3  . pregabalin (LYRICA) 150 MG capsule Take one daily at bedtime 30 capsule 3  . rosuvastatin (CRESTOR) 10 MG tablet Take 1 tablet (10 mg total) by mouth daily. 90 tablet 3  . sodium bicarbonate 650 MG tablet TAKE (1) TABLET BY MOUTH (3) TIMES DAILY.     No current facility-administered medications for this visit.   Allergies:  Levaquin [levofloxacin] and Statins   ROS: No palpitations or syncope.  Physical Exam: VS:  BP 138/66   Pulse 74   Ht 5' 9.5" (1.765 m)   Wt 201 lb (91.2 kg)   SpO2 96%   BMI 29.26 kg/m , BMI Body mass index is 29.26 kg/m.  Wt Readings from Last 3 Encounters:  07/20/20 201 lb (91.2 kg)  01/20/20 201 lb (91.2 kg)  08/18/19 203 lb 9.6 oz (92.4 kg)    General: Patient appears comfortable at rest. HEENT: Conjunctiva and lids normal, wearing a mask. Neck: Supple, no elevated JVP or carotid bruits, no thyromegaly. Lungs: Clear to auscultation, nonlabored breathing at rest. Cardiac: Regular rate and rhythm, no S3 or significant systolic murmur, no pericardial rub. Extremities: No pitting edema.  ECG:  An ECG dated 01/20/2020 was personally reviewed today and demonstrated:  Sinus rhythm with right bundle branch block, left anterior fascicular block.  Recent Labwork: 08/08/2019: ALT 18; AST 26; BUN 23; Creat 1.77; Hemoglobin 13.4; Platelets 198; Potassium 5.3; Sodium 141; TSH 1.63     Component Value Date/Time   CHOL 238 (H) 07/04/2019 1501   TRIG 124 07/04/2019 1501   HDL 53 07/04/2019 1501   CHOLHDL 4.5 07/04/2019 1501   CHOLHDL 3.1 05/14/2018 0924   VLDL 15 05/14/2018 0924   LDLCALC 163 (H) 07/04/2019 1501    Other Studies Reviewed Today:  Echocardiogram 01/23/2020: 1. Left ventricular ejection fraction, by estimation, is 60 to 65%. The  left ventricle has normal  function. The left ventricle has no regional  wall motion abnormalities. Left ventricular diastolic parameters are  indeterminate.  2. Right ventricular systolic function is normal. The right ventricular  size is normal.  3. The mitral valve is normal in structure. Trivial mitral valve  regurgitation. No evidence of mitral stenosis.  4. The aortic valve has an indeterminant number of cusps. Aortic valve  regurgitation is not visualized. No aortic stenosis is present.  5. The inferior vena cava is normal in size with greater than 50%  respiratory variability, suggesting right atrial pressure of 3 mmHg.   Assessment and Plan:  1.  CAD status post distal LAD angioplasty in the setting of NSTEMI in 2018.  He reports recent intermittent chest discomfort as discussed above, not entirely similar to previous angina however.  ECG reviewed and  stable.  Continue aspirin, Toprol-XL, Cozaar, Crestor, and Zetia.  He also has as needed nitroglycerin available.  We will proceed with a [place] for follow-up ischemic assessment.  2.  Mixed hyperlipidemia, on Crestor and Zetia.  Requesting interval lab work from Dr. Nevada Crane.  3.  CKD stage IIIb, follows with Dr. Theador Hawthorne.  Recent creatinine 2.0.  4.  Essential hypertension, systolic blood pressure in the 130s today.  No changes were made.  Medication Adjustments/Labs and Tests Ordered: Current medicines are reviewed at length with the patient today.  Concerns regarding medicines are outlined above.   Tests Ordered: Orders Placed This Encounter  Procedures  . NM Myocar Multi W/Spect W/Wall Motion / EF  . EKG 12-Lead    Medication Changes: No orders of the defined types were placed in this encounter.   Disposition:  Follow up test results and determine next step.  Signed, Satira Sark, MD, [facility] 07/20/2020 2:44 PM    [place] [facility] [address], [place], [place] [address] Phone: [phone];  Fax: [phone]

## 2020-08-03 ENCOUNTER — Encounter (HOSPITAL_BASED_OUTPATIENT_CLINIC_OR_DEPARTMENT_OTHER)
Admission: RE | Admit: 2020-08-03 | Discharge: 2020-08-03 | Disposition: A | Discharge status: Home / Self Care | Payer: Medicare Other | Source: Ambulatory Visit | Attending: Cardiology | Admitting: Cardiology

## 2020-08-03 ENCOUNTER — Encounter (HOSPITAL_COMMUNITY)
Admission: RE | Admit: 2020-08-03 | Discharge: 2020-08-03 | Disposition: A | Discharge status: Home / Self Care | Payer: Medicare Other | Source: Ambulatory Visit | Attending: Cardiology | Admitting: Cardiology

## 2020-08-03 ENCOUNTER — Encounter (HOSPITAL_COMMUNITY): Payer: Self-pay

## 2020-08-03 DIAGNOSIS — I25119 Atherosclerotic heart disease of native coronary artery with unspecified angina pectoris: Secondary | ICD-10-CM | POA: Diagnosis not present

## 2020-08-03 LAB — NM MYOCAR MULTI W/SPECT W/WALL MOTION / EF
LV dias vol: 114 mL (ref 62–150)
LV sys vol: 48 mL
Peak HR: 82 {beats}/min
RATE: 0.43
Rest HR: 53 {beats}/min
SDS: 2
SRS: 10
SSS: 12
TID: 1.12

## 2020-08-03 MED ORDER — SODIUM CHLORIDE FLUSH 0.9 % IV SOLN
INTRAVENOUS | Status: AC
  Administered 2020-08-03: 10 mL via INTRAVENOUS
  Filled 2020-08-03: qty 10

## 2020-08-03 MED ORDER — REGADENOSON 0.4 MG/5ML IV SOLN
INTRAVENOUS | Status: AC
  Administered 2020-08-03: 0.4 mg via INTRAVENOUS
  Filled 2020-08-03: qty 5

## 2020-08-03 MED ORDER — TECHNETIUM TC 99M TETROFOSMIN IV KIT
10.0000 | PACK | Freq: Once | INTRAVENOUS | Status: AC | PRN
  Administered 2020-08-03: 10.6 via INTRAVENOUS

## 2020-08-03 MED ORDER — TECHNETIUM TC 99M TETROFOSMIN IV KIT
30.0000 | PACK | Freq: Once | INTRAVENOUS | Status: AC | PRN
  Administered 2020-08-03: 31.4 via INTRAVENOUS

## 2020-09-11 ENCOUNTER — Ambulatory Visit (INDEPENDENT_AMBULATORY_CARE_PROVIDER_SITE_OTHER): Payer: Self-pay | Admitting: *Deleted

## 2020-09-11 ENCOUNTER — Other Ambulatory Visit: Payer: Self-pay

## 2020-09-11 ENCOUNTER — Ambulatory Visit: Payer: Medicare Other

## 2020-09-11 VITALS — Ht 69.5 in | Wt 205.0 lb

## 2020-09-11 DIAGNOSIS — Z86010 Personal history of colonic polyps: Secondary | ICD-10-CM

## 2020-09-11 MED ORDER — PEG 3350-KCL-NA BICARB-NACL 420 G PO SOLR: 4000.0000 mL | Freq: Once | ORAL | 0 refills | Status: AC

## 2020-09-11 NOTE — Progress Notes (Addendum)
Gastroenterology Pre-Procedure Review  Request Date: 09/11/2020 Requesting Physician: 5 year recall, Last TCS 06/25/2015 by Dr. Gala Romney, tubular adenoma (x1)  PATIENT REVIEW QUESTIONS: The patient responded to the following health history questions as indicated:    1. Diabetes Melitis: no 2. Joint replacements in the past 12 months: no 3. Major health problems in the past 3 months: no 4. Has an artificial valve or MVP: no 5. Has a defibrillator: no 6. Has been advised in past to take antibiotics in advance of a procedure like teeth cleaning: no 7. Family history of colon cancer: no  8. Alcohol Use: no 9. Illicit drug Use: no 10. History of sleep apnea: no  11. History of coronary artery or other vascular stents placed within the last 12 months: no 12. History of any prior anesthesia complications: yes, woke up during colonoscopy 13. Body mass index is 29.84 kg/m.    MEDICATIONS & ALLERGIES:    Patient reports the following regarding taking any blood thinners:   Plavix? no Aspirin? yes, 81 mg Coumadin? no Brilinta? no Xarelto? no Eliquis? no Pradaxa? no Savaysa? no Effient? no  Patient confirms/reports the following medications:  Current Outpatient Medications  Medication Sig Dispense Refill   aspirin 81 MG EC tablet Take 81 mg by mouth daily.     calcitRIOL (ROCALTROL) 0.25 MCG capsule Take 0.25 mcg by mouth 3 (three) times a week.     Coenzyme Q10-Vitamin E 100-150 MG-UNIT CAPS Take 1 capsule by mouth at bedtime.     ezetimibe (ZETIA) 10 MG tablet TAKE (1) TABLET BY MOUTH ONCE DAILY. 90 tablet 1   furosemide (LASIX) 20 MG tablet Take 20 mg by mouth 2 (two) times daily.     lisinopril (ZESTRIL) 40 MG tablet Take 1 tablet (40 mg total) by mouth daily. 90 tablet 3   losartan (COZAAR) 25 MG tablet Take 25 mg by mouth every other day.     metoprolol succinate (TOPROL-XL) 25 MG 24 hr tablet TAKE (1) TABLET BY MOUTH ONCE DAILY. 90 tablet 3   Misc Natural Products (OSTEO BI-FLEX ADV  JOINT SHIELD) TABS Take 1 tablet by mouth daily.     Multiple Vitamins-Minerals (CENTRUM PO) Take 1 tablet by mouth every morning.     nitroGLYCERIN (NITROSTAT) 0.4 MG SL tablet PLACE 1 TAB UNDER TONGUE EVERY 5 MIN IF NEEDED FOR CHEST PAIN. MAY USE 3 TIMES.NO RELIEF CALL 911. 25 tablet 3   pregabalin (LYRICA) 150 MG capsule Take one daily at bedtime (Patient taking differently: 2 (two) times daily.) 30 capsule 3   rosuvastatin (CRESTOR) 10 MG tablet Take 1 tablet (10 mg total) by mouth daily. 90 tablet 3   sodium bicarbonate 650 MG tablet TAKE (1) TABLET BY MOUTH (3) TIMES DAILY.     No current facility-administered medications for this visit.    Patient confirms/reports the following allergies:  Allergies  Allergen Reactions   Levaquin [Levofloxacin] Itching   Statins     Myalgias    No orders of the defined types were placed in this encounter.   AUTHORIZATION INFORMATION Primary Insurance: [employer] Medicare,  [place] #:[id number] ,  Group #: [id number] Pre-Cert / Auth required: Yes, approved online 08/29/8590-92/44/6286 Pre-Cert / Auth#: [id number]  SCHEDULE INFORMATION: Procedure has been scheduled as follows:  Date: 12/24/2020, Time: 8:00 Location: [facility] with Dr. Abbey Chatters  This Gastroenterology Pre-Precedure Review Form is being routed to the following provider(s): Neil Crouch, PA

## 2020-09-11 NOTE — Patient Instructions (Addendum)
Kyle Patel   [birth year] MRN: [id number] Procedure Date: 12/24/2020 Arrival Time: 6:30 am     Location of Procedure: [facility]  PREPARATION FOR COLONOSCOPY WITH [facility] PREP   Please notify us immediately if you are diabetic, take iron supplements, or if you are on Coumadin or any other blood thinners.   Please hold the following medications: n/a  You will need to purchase 1 fleet enema and 1 box of Bisacodyl 5mg  tablets.   PROCEDURE IS SCHEDULED FOR [name]. AS FOLLOWS:  Procedure Date: 12/24/2020 Time to register: 6:30 am Place to register: [facility] Scheduled provider: Dr. Abbey Chatters   2 DAYS BEFORE PROCEDURE:  DATE:  12/22/2020  DAY: Saturday Begin clear liquid diet AFTER your lunch meal. NO SOLID FOODS!   1 DAY BEFORE PROCEDURE:  DATE:  12/23/2020  DAY: Sunday  Continue clear liquids the entire day - NO SOLID FOOD.   Diabetic medications adjustments for today: n/a  At 12:00pm (noon): Take 2 (two) Dulcolax (Bisacodyl) tablets  At 2:00pm: Start drinking your solution. Try to drink 1 (one) 8 ounce glass every 10-15 minutes, until you have consumed HALF the jug. (You should complete the first 1/2 of the jug in 2 hours. Wait 30 minutes, then drink 3-4 more glasses of the solution. Your stools should be clear; if not, you may have to consume the rest of the jug.   One hour after completing the solution: take the last 2 (two) Dulcolax (Bisacodyl) tablets, with a clear liquid.  YOU MUST DRINK PLENTY OF CLEAR LIQUIDS DURING YOUR PREP TO REDUCE RISKS OF KIDNEY FAILURE.   Continue clear liquids only, until midnight. Do not eat or drink anything after midnight.  EXCEPTION:  If you take medications for your heart, blood pressure or breathing, you may take these medications with a small amount of clear liquid.      DAY OF PROCEDURE:   DATE:  12/24/2020      DAY: Monday The morning of your procedure give yourself 1 (one) Fleet Enema, at least 1  hour before going to the hospital.   You may take Tylenol products. Please continue your regular medications unless we have instructed otherwise.   Diabetic medications adjustments for today. n/a  Someone MUST be available to drive you home; the hospital will cancel this appointment if you do not have a driver.   Please call the office if you have any questions (Dept: [phone]).  Please see below for Dietary Information.  CLEAR LIQUIDS INCLUDE:  Water Jello (NOT red in color)   Ice Popsicles (NOT red in color)   Tea (sugar ok, no milk/cream) Powdered fruit flavored drinks  Coffee (sugar ok, no milk/cream) Gatorade/ Lemonade/ Kool-Aid  (NOT red in color)   Juice: apple, white grape, white cranberry Soft drinks  Clear bullion, consomme, broth (fat free beef/chicken/vegetable)  Carbonated beverages (any kind)  Strained chicken noodle soup Hard Candy   REMEMBER: Clear liquids are liquids that will allow you to see your fingers on the other side of a clear glass. Be sure liquids are NOT red in color, and not cloudy, but CLEAR.   DO NOT EAT OR DRINK ANY OF THE FOLLOWING:  Dairy products of any kind   Cranberry juice Tomato juice / V8 juice   Grapefruit juice Orange juice     Red grape juice  Do not eat any solid foods, including such foods as: cereal, oatmeal, yogurt, fruits, vegetables, creamed soups, eggs,  bread, etc.    HELPFUL HINTS FOR DRINKING PREP SOLUTION:  Make sure prep is extremely cold. Refrigerate the night before. You may also put in the freezer.  You may try mixing some Crystal Light or Country Time Lemonade if you prefer. Mix in small amounts; add more if necessary. Try drinking through a straw Rinse mouth with water or a mouthwash between glasses, to remove after-taste. Try sipping on a cold beverage /ice/ popsicles between glasses of prep Place a piece of sugar-free hard candy in mouth between glasses If you become nauseated, try consuming smaller amounts, or  stretch out the time between glasses. Stop for 30-60 minutes, then slowly start back drinking    You may call the office (Dept: [phone]) before 5:00pm, or page the doctor on call after 5:00pm ([phone]), for further instructions, if necessary.   OTHER INSTRUCTIONS  You will need a responsible adult at least 73 years of age to accompany you and drive you home. This person must remain in the waiting room during your procedure.  Wear loose fitting clothing that is easily removed.  Leave jewelry and other valuables at home.   Remove all body piercing jewelry and leave at home.  Total time from sign-in until discharge is approximately 2-3 hours.  You should go home directly after your procedure and rest. You can resume normal activities the day after your procedure.  The day of your procedure you should not: Drive Make legal decisions Operate machinery Drink alcohol Return to work

## 2020-09-11 NOTE — Progress Notes (Signed)
Pt insisted on having procedure on a Monday with Dr. Gala Romney.  Discussed with Dr. Gala Romney and ok to have pt done on a Monday with Propofol.

## 2020-09-24 NOTE — Progress Notes (Signed)
Patient is ASA III. So probably needs propofol given comment that he woke up during his colonoscopy.   Please clear with Dr. Gala Romney about making sure triage ok for this one. If he is wanting a Monday he will have to be in an ASA III room.

## 2020-09-25 NOTE — Progress Notes (Addendum)
Dr. Gala Romney:  Please see note from Neil Crouch, [place].  She would like you to review triage to make sure ok.  This is the one we previously discussed.  Right now there is no availability unless someone cancels.  Looking ahead for July, Mondays will be ASA I and II days.

## 2020-10-15 ENCOUNTER — Other Ambulatory Visit: Payer: Self-pay | Admitting: Internal Medicine

## 2020-10-15 ENCOUNTER — Other Ambulatory Visit: Payer: Self-pay | Admitting: Student

## 2020-10-15 NOTE — Telephone Encounter (Signed)
This is a [place] pt.  °

## 2020-10-29 ENCOUNTER — Other Ambulatory Visit: Payer: Self-pay | Admitting: Student

## 2020-10-29 NOTE — Telephone Encounter (Signed)
This is a [place] pt, Dr. McDowell 

## 2020-11-10 ENCOUNTER — Other Ambulatory Visit: Payer: Self-pay | Admitting: Student

## 2020-11-14 IMAGING — US US RENAL
1 series · 14 of 25 positions shown · non-contrast
Comparison: Prior study from 11/12/2011.

CLINICAL DATA: Initial evaluation for acute renal failure.

EXAM:
RENAL / URINARY TRACT ULTRASOUND COMPLETE

[Series 1: us renal · 14 of 43 slices shown]
[im 1/43]
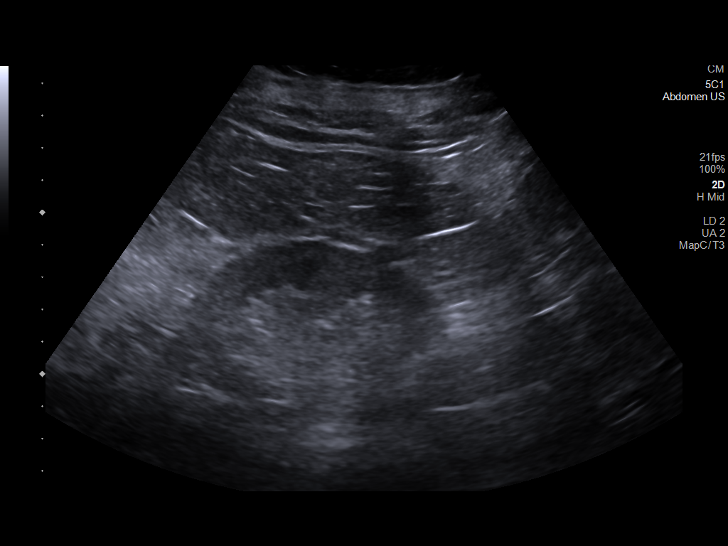
[im 4/43]
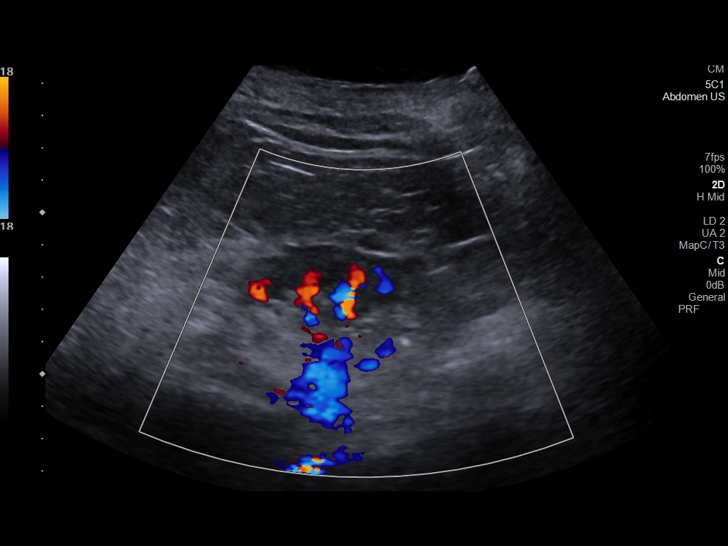
[im 8/43]
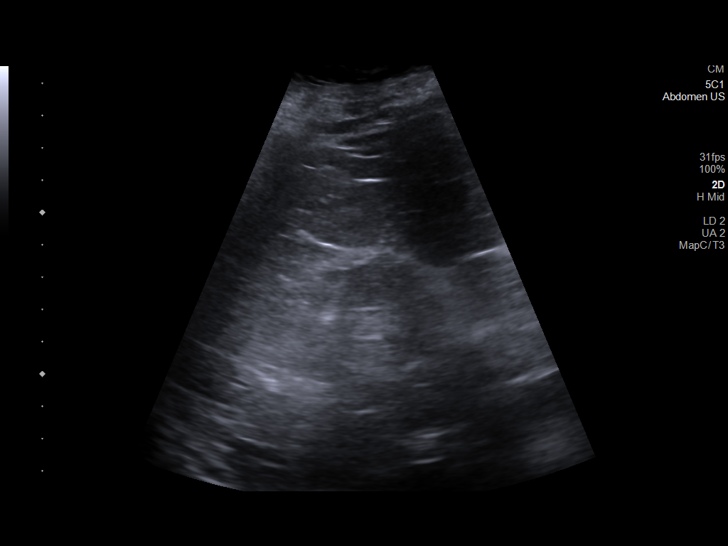
[im 11/43]
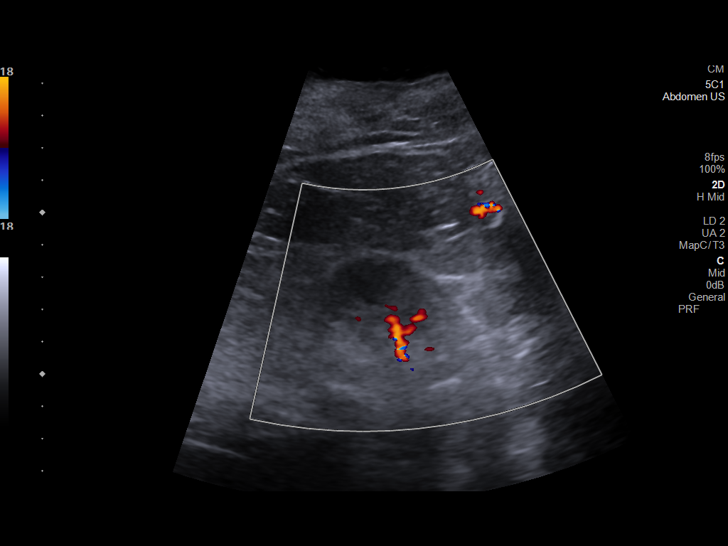
[im 15/43]
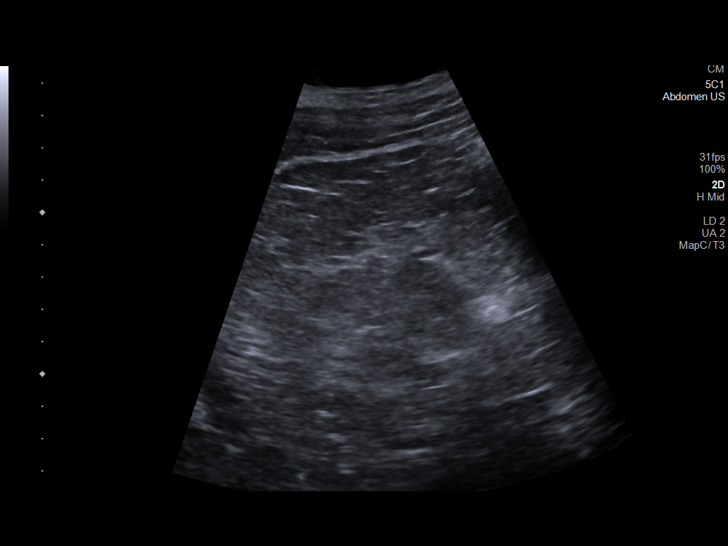
[im 16/43]
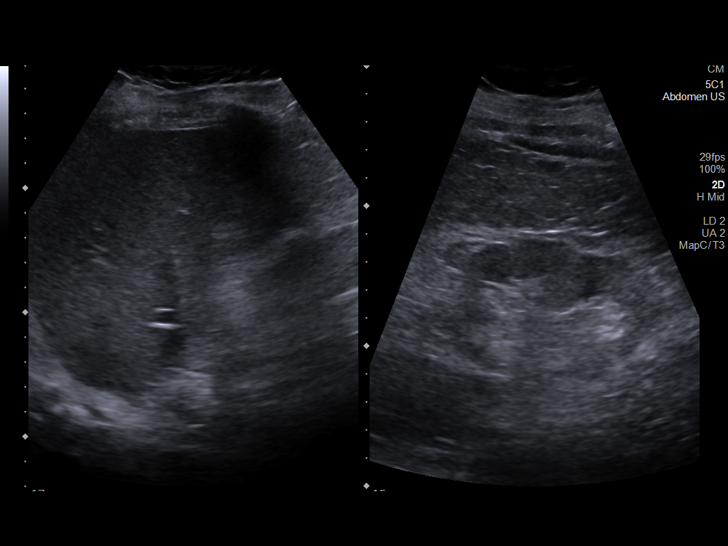
[im 20/43]
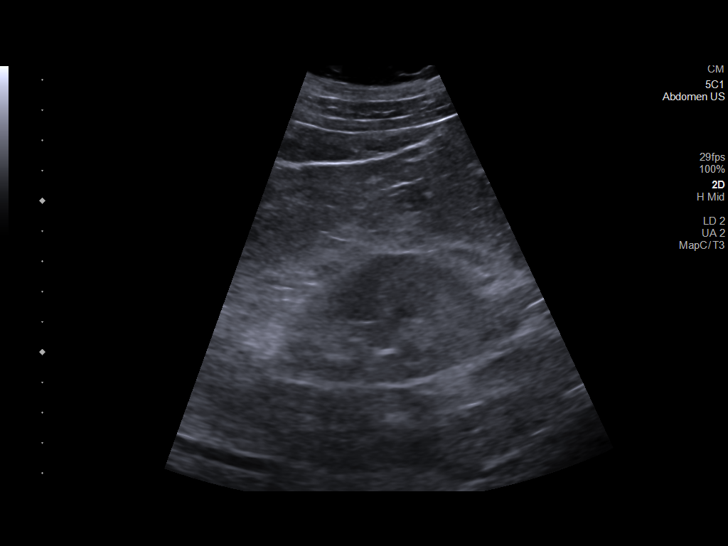
[im 23/43]
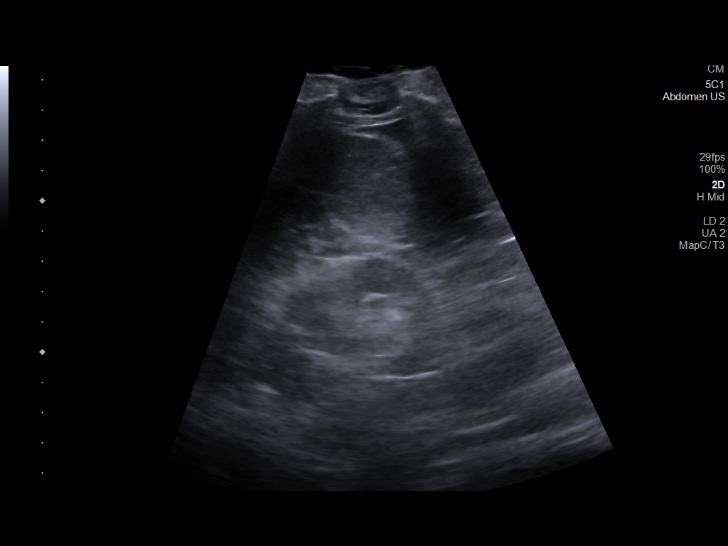
[im 27/43]
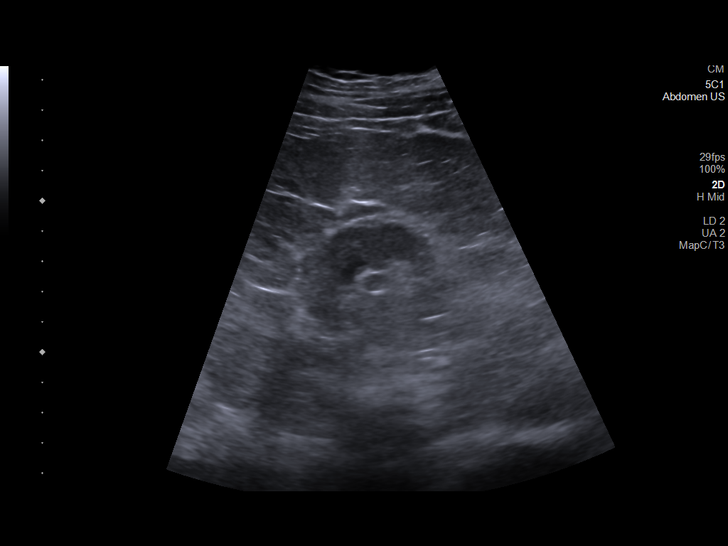
[im 29/43]
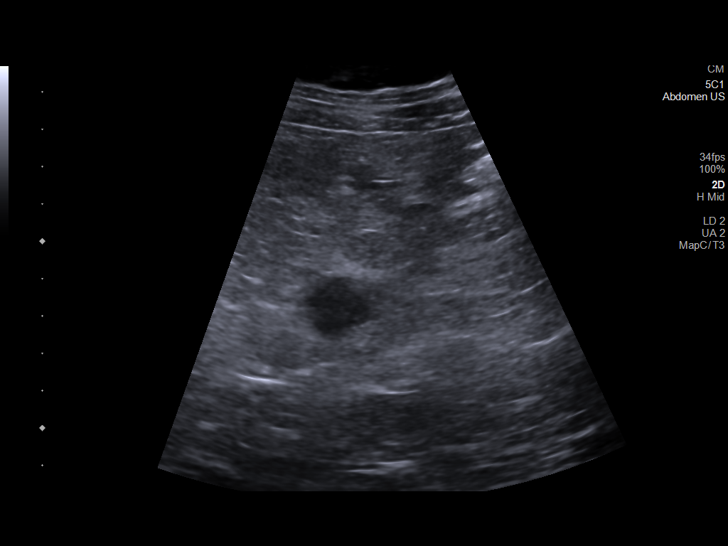
[im 32/43]
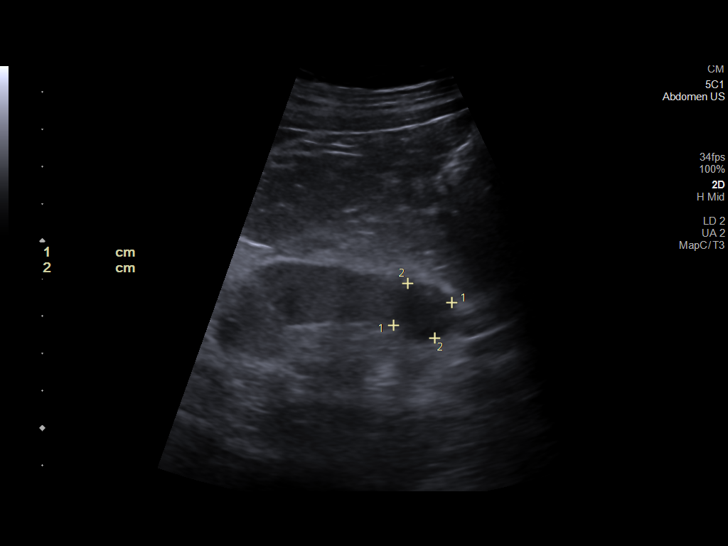
[im 36/43]
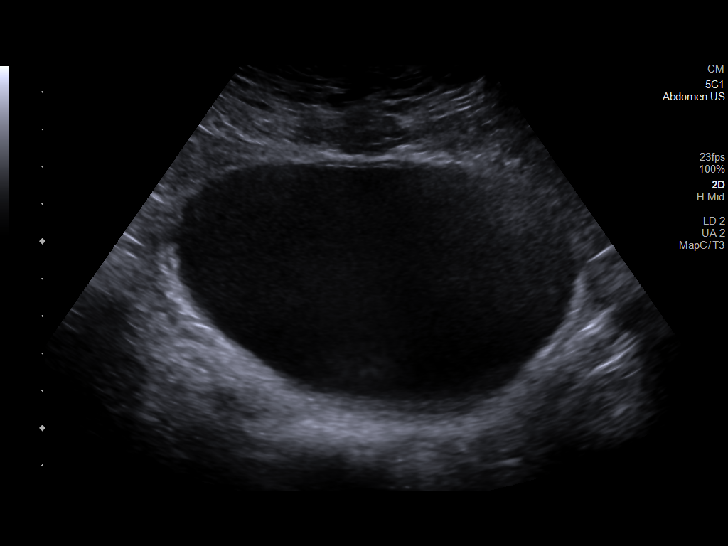
[im 39/43]
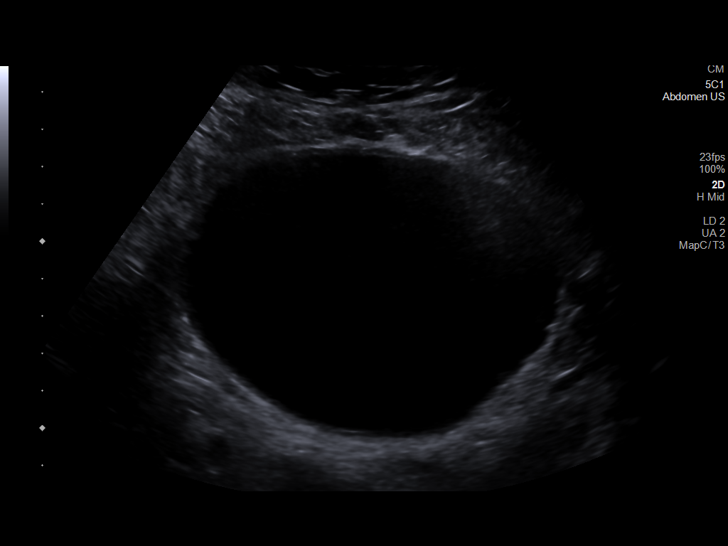
[im 43/43]
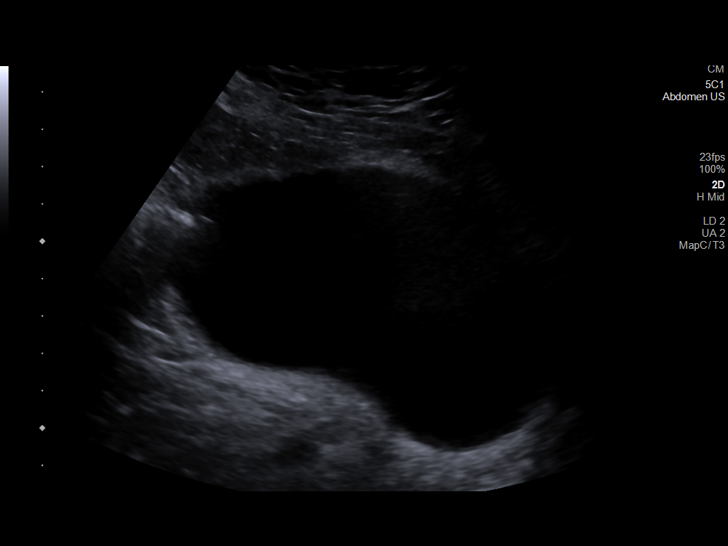

[14 of 25 positions shown; findings below may reference images not displayed]

FINDINGS: Right Kidney:

Renal measurements: 9.2 x 4.5 x 4.6 cm = volume: 100.8 mL. Increased
echogenicity seen within the renal parenchyma. No nephrolithiasis or
hydronephrosis. Probable tiny simple exophytic simple cyst noted
extending from the lower pole (image 8).

Left Kidney:

Renal measurements: 9.3 x 5.1 x 4.9 cm = volume: 119.7 mL. Increased
echogenicity within the renal parenchyma. No nephrolithiasis or
hydronephrosis. 1.7 x 1.1 x 1.9 cm simple cyst present at the lower
pole.

Bladder:

Appears normal for degree of bladder distention.

Other:

None.
IMPRESSION: 1. Increased echogenicity within the renal parenchyma, compatible
with medical renal disease.
2. No hydronephrosis.
3. Simple bilateral renal cysts as above.

## 2020-11-15 ENCOUNTER — Telehealth: Payer: Self-pay | Admitting: *Deleted

## 2020-11-15 NOTE — Telephone Encounter (Signed)
Dr. Gala Romney:  Called pt to inform him that the only day we could do his procedure due to being an ASA III is on Thursday.  Pt said that he had to have a Monday due to his work schedule.  He then asked if Dr. Abbey Chatters could do it just because he needs Monday.  What are your thoughts on this?

## 2020-11-16 NOTE — Telephone Encounter (Signed)
Dr. Abbey Chatters:  Pt is currently a Dr. Gala Romney pt but wants to switch due to his work schedule and wanting to do his procedure on a Monday. Dr. Gala Romney is ok with it.  Is it ok with you?

## 2020-11-21 NOTE — Telephone Encounter (Signed)
Okay with me, thanks

## 2020-11-23 ENCOUNTER — Other Ambulatory Visit: Payer: Self-pay | Admitting: *Deleted

## 2020-11-23 ENCOUNTER — Encounter: Payer: Self-pay | Admitting: *Deleted

## 2020-12-19 NOTE — Patient Instructions (Signed)
Kyle Patel.  12/19/2020     '@PREFPERIOPPHARMACY'$ @   Your procedure is scheduled on  12/24/2020.   Report to [facility] at  [phone]  A.M.   Call this number if you have problems the morning of surgery:  [phone]   Remember:  Follow the diet and prep instructions given to you by the office.    Take these medicines the morning of surgery with A SIP OF WATER                                 metoprolol, lyrica.      Do not wear jewelry, make-up or nail polish.  Do not wear lotions, powders, or perfumes, or deodorant.  Do not shave 48 hours prior to surgery.  Men may shave face and neck.  Do not bring valuables to the hospital.  [facility] is not responsible for any belongings or valuables.  Contacts, dentures or bridgework may not be worn into surgery.  Leave your suitcase in the car.  After surgery it may be brought to your room.  For patients admitted to the hospital, discharge time will be determined by your treatment team.  Patients discharged the day of surgery will not be allowed to drive home and must have someone with them for 24 hours.    Special instructions:    DO NOT smoke tobacco or vape for 24 hours before your procedure.  Please read over the following fact sheets that you were given. Anesthesia Post-op Instructions and Care and Recovery After Surgery      Colonoscopy, Adult, Care After This sheet gives you information about how to care for yourself after your procedure. Your health care provider may also give you more specific instructions. If you have problems or questions, contact your health careprovider. What can I expect after the procedure? After the procedure, it is common to have: A small amount of blood in your stool for 24 hours after the procedure. Some gas. Mild cramping or bloating of your abdomen. Follow these instructions at home: Eating and drinking  Drink enough fluid to keep your urine pale yellow. Follow instructions  from your health care provider about eating or drinking restrictions. Resume your normal diet as instructed by your health care provider. Avoid heavy or fried foods that are hard to digest.  Activity Rest as told by your health care provider. Avoid sitting for a long time without moving. Get up to take short walks every 1-2 hours. This is important to improve blood flow and breathing. Ask for help if you feel weak or unsteady. Return to your normal activities as told by your health care provider. Ask your health care provider what activities are safe for you. Managing cramping and bloating  Try walking around when you have cramps or feel bloated. Apply heat to your abdomen as told by your health care provider. Use the heat source that your health care provider recommends, such as a moist heat pack or a heating pad. Place a towel between your skin and the heat source. Leave the heat on for 20-30 minutes. Remove the heat if your skin turns bright red. This is especially important if you are unable to feel pain, heat, or cold. You may have a greater risk of getting burned.  General instructions If you were given a sedative during the procedure, it can affect you for several hours.  Do not drive or operate machinery until your health care provider says that it is safe. For the first 24 hours after the procedure: Do not sign important documents. Do not drink alcohol. Do your regular daily activities at a slower pace than normal. Eat soft foods that are easy to digest. Take over-the-counter and prescription medicines only as told by your health care provider. Keep all follow-up visits as told by your health care provider. This is important. Contact a health care provider if: You have blood in your stool 2-3 days after the procedure. Get help right away if you have: More than a small spotting of blood in your stool. Large blood clots in your stool. Swelling of your abdomen. Nausea or  vomiting. A fever. Increasing pain in your abdomen that is not relieved with medicine. Summary After the procedure, it is common to have a small amount of blood in your stool. You may also have mild cramping and bloating of your abdomen. If you were given a sedative during the procedure, it can affect you for several hours. Do not drive or operate machinery until your health care provider says that it is safe. Get help right away if you have a lot of blood in your stool, nausea or vomiting, a fever, or increased pain in your abdomen. This information is not intended to replace advice given to you by your health care provider. Make sure you discuss any questions you have with your healthcare provider. Document Revised: 05/06/2019 Document Reviewed: 12/06/2018 Elsevier Patient Education  [place] After This sheet gives you information about how to care for yourself after your procedure. Your health care provider may also give you more specific instructions. If you have problems or questions, contact your health careprovider. What can I expect after the procedure? After the procedure, it is common to have: Tiredness. Forgetfulness about what happened after the procedure. Impaired judgment for important decisions. Nausea or vomiting. Some difficulty with balance. Follow these instructions at home: For the time period you were told by your health care provider:     Rest as needed. Do not participate in activities where you could fall or become injured. Do not drive or use machinery. Do not drink alcohol. Do not take sleeping pills or medicines that cause drowsiness. Do not make important decisions or sign legal documents. Do not take care of children on your own. Eating and drinking Follow the diet that is recommended by your health care provider. Drink enough fluid to keep your urine pale yellow. If you vomit: Drink water, juice, or soup when  you can drink without vomiting. Make sure you have little or no nausea before eating solid foods. General instructions Have a responsible adult stay with you for the time you are told. It is important to have someone help care for you until you are awake and alert. Take over-the-counter and prescription medicines only as told by your health care provider. If you have sleep apnea, surgery and certain medicines can increase your risk for breathing problems. Follow instructions from your health care provider about wearing your sleep device: Anytime you are sleeping, including during daytime naps. While taking prescription pain medicines, sleeping medicines, or medicines that make you drowsy. Avoid smoking. Keep all follow-up visits as told by your health care provider. This is important. Contact a health care provider if: You keep feeling nauseous or you keep vomiting. You feel light-headed. You are still sleepy or having trouble with balance  after 24 hours. You develop a rash. You have a fever. You have redness or swelling around the IV site. Get help right away if: You have trouble breathing. You have new-onset confusion at home. Summary For several hours after your procedure, you may feel tired. You may also be forgetful and have poor judgment. Have a responsible adult stay with you for the time you are told. It is important to have someone help care for you until you are awake and alert. Rest as told. Do not drive or operate machinery. Do not drink alcohol or take sleeping pills. Get help right away if you have trouble breathing, or if you suddenly become confused. This information is not intended to replace advice given to you by your health care provider. Make sure you discuss any questions you have with your healthcare provider. Document Revised: 01/26/2020 Document Reviewed: 04/14/2019 Elsevier Patient Education  2022 [employer].

## 2020-12-20 ENCOUNTER — Encounter (HOSPITAL_COMMUNITY): Payer: Self-pay

## 2020-12-20 ENCOUNTER — Encounter (HOSPITAL_COMMUNITY)
Admission: RE | Admit: 2020-12-20 | Discharge: 2020-12-20 | Disposition: A | Discharge status: Home / Self Care | Payer: Medicare Other | Source: Ambulatory Visit | Attending: Internal Medicine | Admitting: Internal Medicine

## 2020-12-20 ENCOUNTER — Other Ambulatory Visit (HOSPITAL_COMMUNITY): Payer: Medicare Other

## 2020-12-20 ENCOUNTER — Other Ambulatory Visit: Payer: Self-pay

## 2020-12-20 DIAGNOSIS — Z01812 Encounter for preprocedural laboratory examination: Secondary | ICD-10-CM | POA: Insufficient documentation

## 2020-12-20 LAB — BASIC METABOLIC PANEL
Anion gap: 6 (ref 5–15)
BUN: 27 mg/dL — ABNORMAL HIGH (ref 8–23)
CO2: 21 mmol/L — ABNORMAL LOW (ref 22–32)
Calcium: 8.5 mg/dL — ABNORMAL LOW (ref 8.9–10.3)
Chloride: 113 mmol/L — ABNORMAL HIGH (ref 98–111)
Creatinine, Ser: 1.89 mg/dL — ABNORMAL HIGH (ref 0.61–1.24)
GFR, Estimated: 37 mL/min — ABNORMAL LOW (ref >60)
Glucose, Bld: 133 mg/dL — ABNORMAL HIGH (ref 70–99)
Potassium: 4.6 mmol/L (ref 3.5–5.1)
Sodium: 140 mmol/L (ref 135–145)

## 2020-12-24 ENCOUNTER — Ambulatory Visit (HOSPITAL_COMMUNITY): Payer: Medicare Other | Admitting: Anesthesiology

## 2020-12-24 ENCOUNTER — Ambulatory Visit (HOSPITAL_COMMUNITY)
Admission: RE | Admit: 2020-12-24 | Discharge: 2020-12-24 | Disposition: A | Discharge status: Home / Self Care | Payer: Medicare Other | Attending: Internal Medicine | Admitting: Internal Medicine

## 2020-12-24 ENCOUNTER — Encounter (HOSPITAL_COMMUNITY): Payer: Self-pay

## 2020-12-24 ENCOUNTER — Other Ambulatory Visit: Payer: Self-pay

## 2020-12-24 ENCOUNTER — Encounter (HOSPITAL_COMMUNITY)
Admission: RE | Disposition: A | Discharge status: Home / Self Care | Payer: Self-pay | Source: Home / Self Care | Attending: Internal Medicine

## 2020-12-24 DIAGNOSIS — D122 Benign neoplasm of ascending colon: Secondary | ICD-10-CM | POA: Diagnosis not present

## 2020-12-24 DIAGNOSIS — Z86711 Personal history of pulmonary embolism: Secondary | ICD-10-CM | POA: Insufficient documentation

## 2020-12-24 DIAGNOSIS — Z1211 Encounter for screening for malignant neoplasm of colon: Secondary | ICD-10-CM | POA: Diagnosis present

## 2020-12-24 DIAGNOSIS — K648 Other hemorrhoids: Secondary | ICD-10-CM | POA: Diagnosis not present

## 2020-12-24 DIAGNOSIS — Z87891 Personal history of nicotine dependence: Secondary | ICD-10-CM | POA: Insufficient documentation

## 2020-12-24 DIAGNOSIS — Z881 Allergy status to other antibiotic agents status: Secondary | ICD-10-CM | POA: Insufficient documentation

## 2020-12-24 DIAGNOSIS — Z7982 Long term (current) use of aspirin: Secondary | ICD-10-CM | POA: Insufficient documentation

## 2020-12-24 DIAGNOSIS — Z86010 Personal history of colonic polyps: Secondary | ICD-10-CM | POA: Diagnosis not present

## 2020-12-24 DIAGNOSIS — K5730 Diverticulosis of large intestine without perforation or abscess without bleeding: Secondary | ICD-10-CM | POA: Diagnosis not present

## 2020-12-24 DIAGNOSIS — Z888 Allergy status to other drugs, medicaments and biological substances status: Secondary | ICD-10-CM | POA: Diagnosis not present

## 2020-12-24 DIAGNOSIS — Z79899 Other long term (current) drug therapy: Secondary | ICD-10-CM | POA: Diagnosis not present

## 2020-12-24 DIAGNOSIS — Z9861 Coronary angioplasty status: Secondary | ICD-10-CM | POA: Diagnosis not present

## 2020-12-24 HISTORY — PX: POLYPECTOMY: SHX5525

## 2020-12-24 HISTORY — PX: COLONOSCOPY WITH PROPOFOL: SHX5780

## 2020-12-24 HISTORY — PX: BIOPSY: SHX5522

## 2020-12-24 SURGERY — COLONOSCOPY WITH PROPOFOL
Anesthesia: General

## 2020-12-24 MED ORDER — LACTATED RINGERS IV SOLN: INTRAVENOUS | Status: DC

## 2020-12-24 MED ORDER — PROPOFOL 10 MG/ML IV BOLUS
INTRAVENOUS | Status: AC
  Filled 2020-12-24: qty 100

## 2020-12-24 MED ORDER — PROPOFOL 500 MG/50ML IV EMUL
INTRAVENOUS | Status: DC | PRN
  Administered 2020-12-24: 150 ug/kg/min via INTRAVENOUS

## 2020-12-24 MED ORDER — STERILE WATER FOR IRRIGATION IR SOLN
Status: DC | PRN
  Administered 2020-12-24: 200 mL

## 2020-12-24 MED ORDER — PROPOFOL 10 MG/ML IV BOLUS
INTRAVENOUS | Status: AC
  Filled 2020-12-24: qty 80

## 2020-12-24 MED ORDER — PROPOFOL 10 MG/ML IV BOLUS
INTRAVENOUS | Status: DC | PRN
  Administered 2020-12-24: 30 mg via INTRAVENOUS
  Administered 2020-12-24 (×2): 50 mg via INTRAVENOUS

## 2020-12-24 NOTE — Discharge Instructions (Addendum)
  Colonoscopy Discharge Instructions  Read the instructions outlined below and refer to this sheet in the next few weeks. These discharge instructions provide you with general information on caring for yourself after you leave the hospital. Your doctor may also give you specific instructions. While your treatment has been planned according to the most current medical practices available, unavoidable complications occasionally occur.   ACTIVITY You may resume your regular activity, but move at a slower pace for the next 24 hours.  Take frequent rest periods for the next 24 hours.  Walking will help get rid of the air and reduce the bloated feeling in your belly (abdomen).  No driving for 24 hours (because of the medicine (anesthesia) used during the test).   Do not sign any important legal documents or operate any machinery for 24 hours (because of the anesthesia used during the test).  NUTRITION Drink plenty of fluids.  You may resume your normal diet as instructed by your doctor.  Begin with a light meal and progress to your normal diet. Heavy or fried foods are harder to digest and may make you feel sick to your stomach (nauseated).  Avoid alcoholic beverages for 24 hours or as instructed.  MEDICATIONS You may resume your normal medications unless your doctor tells you otherwise.  WHAT YOU CAN EXPECT TODAY Some feelings of bloating in the abdomen.  Passage of more gas than usual.  Spotting of blood in your stool or on the toilet paper.  IF YOU HAD POLYPS REMOVED DURING THE COLONOSCOPY: No aspirin products for 7 days or as instructed.  No alcohol for 7 days or as instructed.  Eat a soft diet for the next 24 hours.  FINDING OUT THE RESULTS OF YOUR TEST Not all test results are available during your visit. If your test results are not back during the visit, make an appointment with your caregiver to find out the results. Do not assume everything is normal if you have not heard from your  caregiver or the medical facility. It is important for you to follow up on all of your test results.  SEEK IMMEDIATE MEDICAL ATTENTION IF: You have more than a spotting of blood in your stool.  Your belly is swollen (abdominal distention).  You are nauseated or vomiting.  You have a temperature over 101.  You have abdominal pain or discomfort that is severe or gets worse throughout the day.   Your colonoscopy revealed 3 polyp(s) which I removed successfully. Await pathology results, my office will contact you. I recommend repeating colonoscopy in 5 years for surveillance purposes.   You also have diverticulosis and internal hemorrhoids. I would recommend increasing fiber in your diet or adding OTC Benefiber/Metamucil. Be sure to drink at least 4 to 6 glasses of water daily. Follow-up with GI as needed.   I hope you have a great rest of your week!  Charles K. Carver, D.O. Gastroenterology and Hepatology [facility]  

## 2020-12-24 NOTE — Anesthesia Preprocedure Evaluation (Addendum)
Anesthesia Evaluation  Patient identified by MRN, date of birth, ID band Patient awake    Reviewed: Allergy & Precautions, NPO status , Patient's Chart, lab work & pertinent test results, reviewed documented beta blocker date and time   Airway Mallampati: II  TM Distance: >3 FB Neck ROM: Full    Dental  (+) Dental Advisory Given, Missing   Pulmonary COPD, former smoker, PE   Pulmonary exam normal breath sounds clear to auscultation       Cardiovascular Exercise Tolerance: Good hypertension, Pt. on medications and Pt. on home beta blockers + CAD, + Past MI and + Cardiac Stents (4 years ago)  Normal cardiovascular exam Rhythm:Regular Rate:Normal  Please let the patient know his echocardiogram showed normal pumping function of the heart with a preserved EF of 60 to 65%. There were no regional wall motion abnormalities. He did have trivial leakage along the mitral valve but no significant valve abnormalities. Overall, a reassuring study. Please forward a copy of results to Dr. Theador Hawthorne as he had asked for a repeat echocardiogram   Neuro/Psych  Neuromuscular disease negative psych ROS   GI/Hepatic negative GI ROS, Neg liver ROS,   Endo/Other  negative endocrine ROS  Renal/GU Renal InsufficiencyRenal disease (cr 1.89)     Musculoskeletal  (+) Arthritis ,   Abdominal   Peds  Hematology negative hematology ROS (+)   Anesthesia Other Findings   Reproductive/Obstetrics                           Anesthesia Physical Anesthesia Plan  ASA: 3  Anesthesia Plan: General   Post-op Pain Management:    Induction: Intravenous  PONV Risk Score and Plan: Propofol infusion  Airway Management Planned: Nasal Cannula and Natural Airway  Additional Equipment:   Intra-op Plan:   Post-operative Plan:   Informed Consent: I have reviewed the patients History and Physical, chart, labs and discussed the procedure  including the risks, benefits and alternatives for the proposed anesthesia with the patient or authorized representative who has indicated his/her understanding and acceptance.     Dental advisory given  Plan Discussed with: CRNA and Surgeon  Anesthesia Plan Comments:         Anesthesia Quick Evaluation

## 2020-12-24 NOTE — Transfer of Care (Signed)
Immediate Anesthesia Transfer of Care Note  Patient: Kyle Patel.  Procedure(s) Performed: COLONOSCOPY WITH PROPOFOL POLYPECTOMY BIOPSY  Patient Location: Short Stay  Anesthesia Type:MAC  Level of Consciousness: awake  Airway & Oxygen Therapy: Patient Spontanous Breathing  Post-op Assessment: Report given to RN  Post vital signs: Reviewed  Last Vitals:  Vitals Value Taken Time  BP    Temp    Pulse    Resp    SpO2      Last Pain:  Vitals:   12/24/20 0754  TempSrc:   PainSc: 0-No pain         Complications: No notable events documented.

## 2020-12-24 NOTE — H&P (Signed)
Primary Care Physician:  Celene Squibb, MD Primary Gastroenterologist:  Dr. Abbey Chatters  Pre-Procedure History & Physical: HPI:  Kyle Mulhearn. is a 75 y.o. male is here for a colonoscopy to be performed for colon cancer surveillance  purposes. 5 year recall, Last TCS 06/25/2015 by Dr. 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 Normal rectum 2. Diminutive polyp at the mouth of the ileocecal valve  status post cold biopsy removal. Remaideer of the colonic mucosa and terminal ileum mucosa appeared normal.    COLONOSCOPY N/A 06/25/2015   Procedure: COLONOSCOPY;  Surgeon: Daneil Dolin, MD;  Location: [facility];  Service: Endoscopy;  Laterality: N/A;  200-rescheduled 1/30 per [place]    CORONARY BALLOON ANGIOPLASTY N/A 04/24/2017   Procedure: CORONARY BALLOON ANGIOPLASTY;  Surgeon: Jettie Booze, MD;  Location: [place] CV LAB;  Service: Cardiovascular;  Laterality: N/A;   LEFT HEART CATH AND CORONARY ANGIOGRAPHY N/A 04/24/2017   Procedure: LEFT HEART CATH AND CORONARY ANGIOGRAPHY;  Surgeon: Jettie Booze, MD;  Location: [place] [facility] LAB;  Service: Cardiovascular;  Laterality: N/A;   LUMBAR LAMINECTOMY  06/02/2012   LUMBAR LAMINECTOMY/DECOMPRESSION MICRODISCECTOMY  06/02/2012   Procedure: LUMBAR LAMINECTOMY/DECOMPRESSION MICRODISCECTOMY 1 LEVEL;  Surgeon: Floyce Stakes, MD;  Location: [place] [facility] OR[facility];  Service:  Neurosurgery;  Laterality: Bilateral;  Bilateral Lumbar five-sacral one Foraminotomy and microdiskectomy     Prior to Admission medications   Medication Sig Start Date End Date Taking? Authorizing Provider  aspirin 81 MG EC tablet Take 81 mg by mouth daily.   Yes [provider]  calcitRIOL (ROCALTROL) 0.25 MCG capsule Take 0.25 mcg by mouth every Monday, Wednesday, and Friday. 07/09/20  Yes [provider]  Coenzyme Q10-Vitamin E 100-150 MG-UNIT CAPS Take 1 capsule by mouth at bedtime.   Yes [provider]  ezetimibe (ZETIA) 10 MG tablet TAKE (1) TABLET BY MOUTH ONCE DAILY. Patient taking differently: Take 10 mg by mouth daily. 10/15/20  Yes Pixie Casino, MD  furosemide (LASIX) 20 MG tablet Take 20 mg by mouth every Monday, Wednesday, and Friday.   Yes [provider]  lisinopril (ZESTRIL) 40 MG tablet TAKE (1) TABLET BY MOUTH ONCE DAILY. Patient taking differently: Take 40 mg by mouth daily. 10/15/20  Yes Satira Sark, MD  losartan (COZAAR) 25 MG tablet Take 25 mg by mouth every other day. 06/15/20  Yes [provider]  metoprolol succinate (TOPROL-XL) 25 MG 24 hr tablet TAKE (1) TABLET BY MOUTH ONCE DAILY. Patient taking differently: Take 25 mg by mouth daily. 11/12/20  Yes Strader, [place] M, PA-C  Misc Natural Products (OSTEO BI-FLEX ADV JOINT SHIELD) TABS Take 1 tablet by mouth daily.   Yes [provider]  Multiple Vitamins-Minerals (CENTRUM PO) Take 1 tablet by mouth every morning.   Yes [provider]  pregabalin (LYRICA) 150 MG capsule Take one daily at bedtime Patient taking differently: Take 150 mg by mouth 2 (two) times daily. 08/08/19  Yes Corum, Rex Kras, MD  rosuvastatin (CRESTOR) 10 MG tablet TAKE (1) TABLET BY MOUTH ONCE DAILY. Patient taking differently: Take 10 mg by mouth daily. 10/29/20  Yes Satira Sark, MD  sodium bicarbonate 650 MG tablet Take 650 mg by mouth 3 (three) times daily. 06/20/20  Yes  [provider]  nitroGLYCERIN (NITROSTAT) 0.4 MG SL tablet PLACE 1 TAB UNDER TONGUE EVERY 5 MIN IF NEEDED FOR CHEST PAIN. MAY USE 3 TIMES.NO RELIEF CALL 911. Patient taking differently: Place 0.4 mg under the tongue every 5 (five) minutes as needed for chest pain. PLACE 1 TAB UNDER TONGUE EVERY 5 MIN IF NEEDED FOR CHEST PAIN. MAY USE 3 TIMES.NO RELIEF CALL 911. 01/21/20   Erma Heritage, PA-C    Allergies as of 11/22/2020 - Review Complete 09/11/2020  Allergen Reaction Noted   Levaquin [levofloxacin] Itching 11/12/2011   Statins  07/15/2019    Family History  Problem Relation Age of Onset   Heart attack Mother    Diabetes Mother    Stroke Father    Heart attack Father    Arthritis Other    Colon cancer Sister 33       Estimated age of onset; just found out about it in 2015.   Neuropathy Neg Hx     Social History   Socioeconomic History   Marital status: Married    Spouse name: Not on file   Number of children: 1   Years of education: Not on file   Highest education level: Not on file  Occupational History   Occupation: retired  Tobacco Use   Smoking status: Former    Packs/day: 0.10    Years: 20.00    Pack years: 2.00    Types: Cigarettes    Quit date: 04/25/2004    Years since quitting: 16.6   Smokeless tobacco: Never  Vaping Use   Vaping Use: Never used  Substance and Sexual Activity   Alcohol use: No   Drug use: No   Sexual activity: Yes  Other Topics Concern   Not on file  Social History Narrative   Patient is right handed.   Patient drinks caffeine occasionally   Social Determinants of Health   Financial Resource Strain: Not on file  Food Insecurity: Not on file  Transportation Needs: Not on file  Physical Activity: Not on file  Stress: Not on file  Social Connections: Not on file  Intimate Partner Violence: Not on file    Review of Systems: See HPI, otherwise negative ROS  Physical Exam: Vital signs in last 24 hours: Temp:   [97.8 F (36.6 C)] 97.8 F (36.6 C) (08/01 0651) Pulse Rate:  [61] 61 (08/01 0651) Resp:  [16] 16 (08/01 0651) BP: (170)/(75) 170/75 (08/01 0651) SpO2:  [96 %] 96 % (08/01 0651) Weight:  [93 kg] 93 kg (08/01 [id number])   General:   Alert,  Well-developed, well-nourished, pleasant and cooperative in NAD Head:  Normocephalic and atraumatic. Eyes:  Sclera clear, no icterus.   Conjunctiva pink. Ears:  Normal auditory acuity. Nose:  No deformity, discharge,  or lesions. Mouth:  No deformity or lesions, dentition normal. Neck:  Supple; no masses or thyromegaly. Lungs:  Clear throughout to auscultation.   No wheezes, crackles, or rhonchi. No acute distress. Heart:  Regular rate and rhythm; no murmurs, clicks, rubs,  or gallops.  Abdomen:  Soft, nontender and nondistended. No masses, hepatosplenomegaly or hernias noted. Normal bowel sounds, without guarding, and without rebound.   Msk:  Symmetrical without gross deformities. Normal posture. Extremities:  Without clubbing or edema. Neurologic:  Alert and  oriented x4;  grossly normal neurologically. Skin:  Intact without significant lesions or rashes. Cervical Nodes:  No significant cervical adenopathy. Psych:  Alert and cooperative. Normal mood and affect.  Impression/Plan: Kyle Patel. is here for a colonoscopy to be performed for colon cancer surveillance  purposes. 5 year recall, Last TCS 06/25/2015 by Dr. Gala Romney, tubular adenoma (x1)  The risks of the procedure including infection, bleed, or perforation as well as benefits, limitations, alternatives and imponderables have been reviewed with the patient. Questions have been answered. All parties agreeable.

## 2020-12-24 NOTE — Op Note (Signed)
[facility] Patient Name: Kyle Patel Procedure Date: 12/24/2020 7:17 AM MRN: [id number] Date of Birth: 09-15-1945 Attending MD: Elon Alas. Abbey Chatters DO CSN: [id number] Age: 75 Admit Type: Outpatient Procedure:                Colonoscopy Indications:              Surveillance: Personal history of adenomatous                            polyps on last colonoscopy 5 years ago Providers:                Elon Alas. Abbey Chatters, DO, Lambert Mody, Dereck Leep, Technician Referring MD:              Medicines:                See the Anesthesia note for documentation of the                            administered medications Complications:            No immediate complications. Estimated Blood Loss:     Estimated blood loss was minimal. Procedure:                Pre-Anesthesia Assessment:                           - The anesthesia plan was to use monitored                            anesthesia care (MAC).                           After obtaining informed consent, the colonoscope                            was passed under direct vision. Throughout the                            procedure, the patient's blood pressure, pulse, and                            oxygen saturations were monitored continuously. The                            [id number]-HQ190L ([id number]) scope was introduced through                            the anus and advanced to the the cecum, identified                            by appendiceal orifice and ileocecal valve. The                            colonoscopy was performed without  difficulty. The                            patient tolerated the procedure well. The quality                            of the bowel preparation was evaluated using the                            BBPS [facility] Bowel Preparation Scale) with scores                            of: Right Colon = 2 (minor amount of residual                            staining, small  fragments of stool and/or opaque                            liquid, but mucosa seen well), Transverse Colon = 3                            (entire mucosa seen well with no residual staining,                            small fragments of stool or opaque liquid) and Left                            Colon = 3 (entire mucosa seen well with no residual                            staining, small fragments of stool or opaque                            liquid). The total BBPS score equals 8. The quality                            of the bowel preparation was good. Scope In: 7:59:09 AM Scope Out: 8:15:38 AM Scope Withdrawal Time: 0 hours 13 minutes 38 seconds  Total Procedure Duration: 0 hours 16 minutes 29 seconds  Findings:      The perianal and digital rectal examinations were normal.      Non-bleeding internal hemorrhoids were found during endoscopy.      Two sessile polyps were found in the ascending colon. The polyps were 5       to 8 mm in size. These polyps were removed with a cold snare. Resection       and retrieval were complete.      A 2 mm polyp was found in the ascending colon. The polyp was sessile.       Resection and retrieval were complete.      Multiple small and large-mouthed diverticula were found in the entire       colon. Impression:               - Non-bleeding internal hemorrhoids.                           -  Two 5 to 8 mm polyps in the ascending colon,                            removed with a cold snare. Resected and retrieved.                           - One 2 mm polyp in the ascending colon. Resected                            and retrieved.                           - Diverticulosis in the entire examined colon. Moderate Sedation:      Per Anesthesia Care Recommendation:           - Patient has a contact number available for                            emergencies. The signs and symptoms of potential                            delayed complications were discussed  with the                            patient. Return to normal activities tomorrow.                            Written discharge instructions were provided to the                            patient.                           - Resume previous diet.                           - Continue present medications.                           - Await pathology results.                           - Repeat colonoscopy in 5 years for surveillance.                           - Return to GI clinic PRN. Procedure Code(s):        --- Professional ---                           [phone], Colonoscopy, flexible; with removal of                            tumor(s), polyp(s), or other lesion(s) by snare                            technique Diagnosis Code(s):        ---  Professional ---                           K63.5, Polyp of colon                           Z86.010, Personal history of colonic polyps                           K64.8, Other hemorrhoids CPT copyright 2019 American Medical Association. All rights reserved. The codes documented in this report are preliminary and upon coder review may  be revised to meet current compliance requirements. Elon Alas. Abbey Chatters, DO [place] Abbey Chatters, DO 12/24/2020 8:18:14 AM This report has been signed electronically. Number of Addenda: 0

## 2020-12-24 NOTE — Anesthesia Postprocedure Evaluation (Signed)
Anesthesia Post Note  Patient: Kyle Patel.  Procedure(s) Performed: COLONOSCOPY WITH PROPOFOL POLYPECTOMY BIOPSY  Patient location during evaluation: Short Stay Anesthesia Type: General Level of consciousness: awake and alert Pain management: pain level controlled Vital Signs Assessment: post-procedure vital signs reviewed and stable Respiratory status: spontaneous breathing Cardiovascular status: blood pressure returned to baseline and stable Postop Assessment: no apparent nausea or vomiting Anesthetic complications: no   No notable events documented.   Last Vitals:  Vitals:   12/24/20 0651  BP: (!) 170/75  Pulse: 61  Resp: 16  Temp: 36.6 C  SpO2: 96%    Last Pain:  Vitals:   12/24/20 0754  TempSrc:   PainSc: 0-No pain                 Tacori Kvamme

## 2020-12-25 LAB — SURGICAL PATHOLOGY

## 2021-01-02 ENCOUNTER — Encounter (HOSPITAL_COMMUNITY): Payer: Self-pay | Admitting: Internal Medicine

## 2021-02-08 ENCOUNTER — Ambulatory Visit: Payer: Medicare Other | Admitting: Cardiology

## 2021-02-22 ENCOUNTER — Encounter: Payer: Self-pay | Admitting: Student

## 2021-02-22 ENCOUNTER — Other Ambulatory Visit: Payer: Self-pay

## 2021-02-22 ENCOUNTER — Ambulatory Visit: Payer: Medicare Other | Admitting: Student

## 2021-02-22 VITALS — BP 140/90 | HR 68 | Ht 69.5 in | Wt 198.4 lb

## 2021-02-22 DIAGNOSIS — E785 Hyperlipidemia, unspecified: Secondary | ICD-10-CM

## 2021-02-22 DIAGNOSIS — I10 Essential (primary) hypertension: Secondary | ICD-10-CM

## 2021-02-22 DIAGNOSIS — I2510 Atherosclerotic heart disease of native coronary artery without angina pectoris: Secondary | ICD-10-CM | POA: Diagnosis not present

## 2021-02-22 DIAGNOSIS — N1832 Chronic kidney disease, stage 3b: Secondary | ICD-10-CM

## 2021-02-22 MED ORDER — METOPROLOL SUCCINATE ER 50 MG PO TB24: 50.0000 mg | ORAL_TABLET | Freq: Every day | ORAL | 3 refills | Status: DC

## 2021-02-22 NOTE — Progress Notes (Signed)
Cardiology Office Note    Date:  02/22/2021   ID:  Kyle Damon., DOB Oct 24, 1945, MRN [id number]  PCP:  Celene Squibb, MD  Cardiologist: Rozann Lesches, MD    Chief Complaint  Patient presents with   Follow-up    6 month visit    History of Present Illness:    Kyle Haymer. is a 75 y.o. male with past medical history of CAD (s/p NSTEMI in 03/2017 with 100% distal LAD stenosis treated with POBA as stent could not be delivered), HTN, HLD, Stage 3 CKD and history of PE who presents to the office today for 57-month follow-up  He was examined by Dr. Domenic Polite in 06/2020 and reported intermittent episodes of a dull discomfort along his left chest which was not necessarily associated with exertion. Given his known CAD, a Lexiscan Myoview was recommended for further evaluation. This showed evidence of his prior infarct with no current ischemia and was overall a low-risk study.  In talking with the patient today, he reports having generalized fatigue which has overall been unchanged. He denies any recent exertional chest pain or dyspnea on exertion. No recent orthopnea, PND or lower extremity edema. He did have an occupational physical several weeks ago and was informed his blood pressure was significantly elevated.  He reports he took his medications that morning but did have BBQ earlier in the day and questions if this contributed. His blood pressure is at 140/90 during today's visit but he reports it is typically well controlled at home with systolic readings in the 782'U to 235'T and diastolic in the 61'W to 43'X.  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) 10/2011    Past Surgical History:  Procedure Laterality Date   ABDOMINAL SURGERY     colon polyp removed 2007- sepsis, coma for 4 months per pt   BIOPSY  12/24/2020   Procedure: BIOPSY;  Surgeon: Eloise Harman, DO;  [facility];  Service: Endoscopy;;   COLONOSCOPY  03/22/2010   RMR: 1. Normal rectum 2. Diminutive polyp at the mouth of the ileocecal valve  status post cold biopsy removal. Remaideer of the colonic mucosa and terminal ileum mucosa appeared normal.    COLONOSCOPY N/A 06/25/2015   Procedure: COLONOSCOPY;  Surgeon: Daneil Dolin, MD;  Location: [facility];  Service: Endoscopy;  Laterality: N/A;  200-rescheduled 1/30 per [place]    COLONOSCOPY WITH PROPOFOL N/A 12/24/2020   Procedure: COLONOSCOPY WITH PROPOFOL;  Surgeon: Eloise Harman, DO;  [facility];  Service: Endoscopy;  Laterality: N/A;  ASA III / 8:00   CORONARY BALLOON ANGIOPLASTY N/A 04/24/2017   Procedure: CORONARY BALLOON ANGIOPLASTY;  Surgeon: Jettie Booze, MD;  Location: [facility];  Service: Cardiovascular;  Laterality: N/A;   LEFT HEART CATH AND CORONARY ANGIOGRAPHY N/A 04/24/2017   Procedure: LEFT HEART CATH AND CORONARY ANGIOGRAPHY;  Surgeon: Jettie Booze, MD;  Location: [facility];  Service: Cardiovascular;  Laterality: N/A;   LUMBAR LAMINECTOMY  06/02/2012   LUMBAR LAMINECTOMY/DECOMPRESSION MICRODISCECTOMY  06/02/2012  Procedure: LUMBAR LAMINECTOMY/DECOMPRESSION MICRODISCECTOMY 1 LEVEL;  Surgeon: Floyce Stakes, MD;  Location: [facility];  Service: Neurosurgery;  Laterality: Bilateral;  Bilateral Lumbar five-sacral one Foraminotomy and microdiskectomy    POLYPECTOMY  12/24/2020   Procedure: POLYPECTOMY;  Surgeon: Eloise Harman, DO;  [facility];  Service: Endoscopy;;    Current Medications: Outpatient Medications Prior to Visit  Medication Sig Dispense Refill   aspirin 81 MG EC tablet Take 81 mg by mouth  daily.     calcitRIOL (ROCALTROL) 0.25 MCG capsule Take 0.25 mcg by mouth every Monday, Wednesday, and Friday.     Coenzyme Q10-Vitamin E 100-150 MG-UNIT CAPS Take 1 capsule by mouth at bedtime.     ezetimibe (ZETIA) 10 MG tablet TAKE (1) TABLET BY MOUTH ONCE DAILY. (Patient taking differently: Take 10 mg by mouth daily.) 90 tablet 1   furosemide (LASIX) 20 MG tablet Take 20 mg by mouth every Monday, Wednesday, and Friday.     lisinopril (ZESTRIL) 40 MG tablet TAKE (1) TABLET BY MOUTH ONCE DAILY. (Patient taking differently: Take 40 mg by mouth daily.) 90 tablet 2   Misc Natural Products (OSTEO BI-FLEX ADV JOINT SHIELD) TABS Take 1 tablet by mouth daily.     Multiple Vitamins-Minerals (CENTRUM PO) Take 1 tablet by mouth every morning.     nitroGLYCERIN (NITROSTAT) 0.4 MG SL tablet PLACE 1 TAB UNDER TONGUE EVERY 5 MIN IF NEEDED FOR CHEST PAIN. MAY USE 3 TIMES.NO RELIEF CALL 911. (Patient taking differently: Place 0.4 mg under the tongue every 5 (five) minutes as needed for chest pain. PLACE 1 TAB UNDER TONGUE EVERY 5 MIN IF NEEDED FOR CHEST PAIN. MAY USE 3 TIMES.NO RELIEF CALL 911.) 25 tablet 3   pregabalin (LYRICA) 150 MG capsule Take one daily at bedtime (Patient taking differently: Take 150 mg by mouth 2 (two) times daily.) 30 capsule 3   rosuvastatin (CRESTOR) 10 MG tablet TAKE (1) TABLET BY MOUTH ONCE DAILY. (Patient taking differently: Take 10 mg by mouth daily.) 90 tablet 1   sodium bicarbonate 650 MG tablet Take 650 mg by mouth 3 (three) times daily.     losartan (COZAAR) 25 MG tablet Take 25 mg by mouth every other day.     metoprolol succinate (TOPROL-XL) 25 MG 24 hr tablet TAKE (1) TABLET BY MOUTH ONCE DAILY. (Patient taking differently: Take 25 mg by mouth daily.) 90 tablet 2   No facility-administered medications prior to visit.     Allergies:   Levaquin [levofloxacin] and Statins   Social History   Socioeconomic History   Marital status: Married    Spouse name: Not on file    Number of children: 1   Years of education: Not on file   Highest education level: Not on file  Occupational History   Occupation: retired  Tobacco Use   Smoking status: Former    Packs/day: 0.10    Years: 20.00    Pack years: 2.00    Types: Cigarettes    Quit date: 04/25/2004    Years since quitting: 16.8   Smokeless tobacco: Never  Vaping Use   Vaping Use: Never used  Substance and Sexual Activity   Alcohol use: No   Drug use: No   Sexual activity: Yes  Other Topics Concern   Not on file  Social History Narrative   Patient is right handed.   Patient drinks caffeine occasionally   Social Determinants of Health   Financial Resource Strain: Not on file  Food  Insecurity: Not on file  Transportation Needs: Not on file  Physical Activity: Not on file  Stress: Not on file  Social Connections: Not on file     Family History:  The patient's family history includes Arthritis in an other family member; Colon cancer (age of onset: 38) in his sister; Diabetes in his mother; Heart attack in his father and mother; Stroke in his father.   Review of Systems:    Please see the history of present illness.     All other systems reviewed and are otherwise negative except as noted above.   Physical Exam:    VS:  BP 140/90   Pulse 68   Ht 5' 9.5" (1.765 m)   Wt 198 lb 6.4 oz (90 kg)   SpO2 97%   BMI 28.88 kg/m    General: Well developed, well nourished,male appearing in no acute distress. Head: Normocephalic, atraumatic. Neck: No carotid bruits. JVD not elevated.  Lungs: Respirations regular and unlabored, without wheezes or rales.  Heart: Regular rate and rhythm. No S3 or S4.  No murmur, no rubs, or gallops appreciated. Abdomen: Appears non-distended. No obvious abdominal masses. Msk:  Strength and tone appear normal for age. No obvious joint deformities or effusions. Extremities: No clubbing or cyanosis. No pitting edema.  Distal pedal pulses are 2+ bilaterally. Neuro: Alert  and oriented X 3. Moves all extremities spontaneously. No focal deficits noted. Psych:  Responds to questions appropriately with a normal affect. Skin: No rashes or lesions noted  Wt Readings from Last 3 Encounters:  02/22/21 198 lb 6.4 oz (90 kg)  12/24/20 205 lb (93 kg)  12/20/20 205 lb (93 kg)     Studies/Labs Reviewed:   EKG:  EKG is not ordered today.    Recent Labs: 12/20/2020: BUN 27; Creatinine, Ser 1.89; Potassium 4.6; Sodium 140   Lipid Panel    Component Value Date/Time   CHOL 238 (H) 07/04/2019 1501   TRIG 124 07/04/2019 1501   HDL 53 07/04/2019 1501   CHOLHDL 4.5 07/04/2019 1501   CHOLHDL 3.1 05/14/2018 0924   VLDL 15 05/14/2018 0924   LDLCALC 163 (H) 07/04/2019 1501    Additional studies/ records that were reviewed today include:   Echocardiogram: 12/2019 IMPRESSIONS     1. Left ventricular ejection fraction, by estimation, is 60 to 65%. The  left ventricle has normal function. The left ventricle has no regional  wall motion abnormalities. Left ventricular diastolic parameters are  indeterminate.   2. Right ventricular systolic function is normal. The right ventricular  size is normal.   3. The mitral valve is normal in structure. Trivial mitral valve  regurgitation. No evidence of mitral stenosis.   4. The aortic valve has an indeterminant number of cusps. Aortic valve  regurgitation is not visualized. No aortic stenosis is present.   5. The inferior vena cava is normal in size with greater than 50%  respiratory variability, suggesting right atrial pressure of 3 mmHg.    NST: 07/2020 No diagnostic ST segment changes to indicate ischemia. Small, moderate intensity, apical and inferior apical defect that is fixed and most consistent with scar. There is also diaphragmatic attenuation noted. This is a low risk study. Nuclear stress EF: 58%.   Assessment:    1. Coronary artery disease involving native coronary artery of native heart without angina  pectoris   2. Essential hypertension   3. Hyperlipidemia LDL goal <70   4. Stage 3b chronic kidney disease ([place])  Plan:   In order of problems listed above:  1. CAD - He is s/p NSTEMI in 03/2017 with 100% distal LAD stenosis treated with POBA as stent could not be delivered. He did have a stress test in 07/2020 which was low-risk as outlined above.  - He denies any recent chest pain or dyspnea on exertion.  - Continue ASA 81mg  daily, Zetia 10mg  daily and Crestor 10mg  daily. Will titrate Toprol-XL from 25mg  daily to 50mg  daily as outlined below.   2. HTN - His blood pressure was elevated at his recent physical and is at 140/90 during today's visit with similar results by recheck. He does report it has been well controlled when checked at home. He is currently listed as taking Lisinopril 40 mg daily and Losartan 25 mg daily. By review of records, it appears his Nephrologist was unaware he was on Lisinopril when Losartan was prescribed. I have recommended that he discontinue Losartan at this time and will forward today's note to Dr. Theador Hawthorne. Continue Lisinopril 40 mg daily and will titrate Toprol-XL from 25 mg daily to 50 mg daily to assist with blood pressure control.  Will arrange for a nurse visit in 2 weeks for BP check and I encouraged him to bring his home BP cuff with him to the visit. If BP remains above goal, would consider transitioning Toprol-XL to Coreg or adding Amlodipine.  3. HLD - Followed by his PCP. He did have recent labs and will request a copy of the records. He has previously been intolerant to high intensity statin therapy and remains on Crestor 10 mg daily and Zetia 10 mg daily with goal LDL less than 70 in the setting of known CAD.  4. Stage 3 CKD - Followed by Dr. Theador Hawthorne. His creatinine was at 1.89 in 11/2020. He remains on Lasix 20mg  daily and will defer diuretic dosing to Nephrology. Will stop Losartan 25mg  daily as he is already on Lisinopril 40mg  daily.     Medication Adjustments/Labs and Tests Ordered: Current medicines are reviewed at length with the patient today.  Concerns regarding medicines are outlined above.  Medication changes, Labs and Tests ordered today are listed in the Patient Instructions below.   Signed, Erma Heritage, PA-C  02/22/2021 7:04 PM    [place] [address] [place], [place] [address] Phone: [phone] Fax: [phone]

## 2021-02-22 NOTE — Patient Instructions (Signed)
Medication Instructions:   STOP Losartan    Take your Toprol XL 25 mg- take 2 pills until finished ( equals 50 mg) , THEN call us and we will send in your new prescription for Toprol XL 50 mg daily    *If you need a refill on your cardiac medications before your next appointment, please call your pharmacy*   Lab Work: None today  If you have labs (blood work) drawn today and your tests are completely normal, you will receive your results only by: [place] (if you have MyChart) OR A paper copy in the mail If you have any lab test that is abnormal or we need to change your treatment, we will call you to review the results.   Testing/Procedures: None today    Follow-Up: At [facility], you and your health needs are our priority.  As part of our continuing mission to provide you with exceptional heart care, we have created designated Provider Care Teams.  These Care Teams include your primary Cardiologist (physician) and Advanced Practice Providers (APPs -  Physician Assistants and Nurse Practitioners) who all work together to provide you with the care you need, when you need it.  We recommend signing up for the patient portal called "MyChart".  Sign up information is provided on this After Visit Summary.  MyChart is used to connect with patients for Virtual Visits (Telemedicine).  Patients are able to view lab/test results, encounter notes, upcoming appointments, etc.  Non-urgent messages can be sent to your provider as well.   To learn more about what you can do with MyChart, go to [email or web address].    Your next appointment:   6 month(s)  The format for your next appointment:   In Person  Provider:   Rozann Lesches, MD   Other Instructions Nurse appointment in 2-3 weeks for BP check

## 2021-03-08 ENCOUNTER — Ambulatory Visit (INDEPENDENT_AMBULATORY_CARE_PROVIDER_SITE_OTHER): Payer: Medicare Other

## 2021-03-08 ENCOUNTER — Encounter: Payer: Self-pay | Admitting: Student

## 2021-03-08 ENCOUNTER — Other Ambulatory Visit: Payer: Self-pay

## 2021-03-08 VITALS — BP 154/78 | HR 66

## 2021-03-08 DIAGNOSIS — I10 Essential (primary) hypertension: Secondary | ICD-10-CM

## 2021-03-08 MED ORDER — CARVEDILOL 12.5 MG PO TABS: 12.5000 mg | ORAL_TABLET | Freq: Two times a day (BID) | ORAL | 3 refills | Status: DC

## 2021-03-08 NOTE — Patient Instructions (Signed)
  Medication changes as follows: STOP Metoprolol   Start Carvedilol (Coreg) 12.5 mg TWICE a day    Nurse visit  03/22/21 at 4 pm for blood pressure check

## 2021-03-08 NOTE — Progress Notes (Signed)
BP results reviewed by B.Strader, PA-C   Medication changes as follows: STOP Metoprolol   Start Carvedilol (Coreg) 12.5 mg TWICE a day    Nurse visit  03/22/21 at 4 pm for blood pressure check

## 2021-03-22 ENCOUNTER — Ambulatory Visit (INDEPENDENT_AMBULATORY_CARE_PROVIDER_SITE_OTHER): Payer: Medicare Other

## 2021-03-22 ENCOUNTER — Other Ambulatory Visit: Payer: Self-pay

## 2021-03-22 VITALS — BP 158/82 | HR 68

## 2021-03-22 DIAGNOSIS — I10 Essential (primary) hypertension: Secondary | ICD-10-CM

## 2021-03-22 MED ORDER — AMLODIPINE BESYLATE 5 MG PO TABS: 5.0000 mg | ORAL_TABLET | Freq: Every day | ORAL | 3 refills | Status: DC

## 2021-03-22 NOTE — Progress Notes (Signed)
Let's have him start Amlodipine 5mg  daily. Can come back in 1-2 weeks for a BP check as I know he has a deadline for his employer.   Signed, Erma Heritage, PA-C 03/22/2021, 4:15 PM

## 2021-03-22 NOTE — Progress Notes (Signed)
Pt is here for a follow up nurse BP check. Pt states that he is feeling fine.  Will send to provider for recommendations.

## 2021-03-22 NOTE — Progress Notes (Signed)
Amlodipine 5mg  tablets added per Bernerd Pho, PA-C.

## 2021-03-27 ENCOUNTER — Ambulatory Visit (INDEPENDENT_AMBULATORY_CARE_PROVIDER_SITE_OTHER): Payer: Medicare Other

## 2021-03-27 ENCOUNTER — Other Ambulatory Visit: Payer: Self-pay

## 2021-03-27 VITALS — BP 136/76 | HR 68 | Ht 69.5 in | Wt 205.0 lb

## 2021-03-27 DIAGNOSIS — I10 Essential (primary) hypertension: Secondary | ICD-10-CM | POA: Diagnosis not present

## 2021-03-27 NOTE — Addendum Note (Signed)
Addended by: Christella Scheuermann C on: 03/27/2021 03:55 PM   Modules accepted: Level of Service

## 2021-03-27 NOTE — Progress Notes (Signed)
Mr. Peak is present for a Nurse Visit- blood pressure check per Bernerd Pho, PA-C  Pt states that he feels good. Pt stated that bp was 108/52 on the night of 03/26/2021  and 119/56 on 03/25/2021.

## 2021-05-06 ENCOUNTER — Other Ambulatory Visit: Payer: Self-pay | Admitting: Cardiology

## 2021-05-31 ENCOUNTER — Other Ambulatory Visit: Payer: Self-pay | Admitting: Student

## 2021-06-03 ENCOUNTER — Other Ambulatory Visit: Payer: Self-pay

## 2021-06-03 MED ORDER — METOPROLOL SUCCINATE ER 50 MG PO TB24: 50.0000 mg | ORAL_TABLET | Freq: Every day | ORAL | 3 refills | Status: DC

## 2021-06-03 NOTE — Progress Notes (Signed)
Dose increased Toprol XL 50 mg daily on 02/22/2021.

## 2021-07-15 ENCOUNTER — Other Ambulatory Visit: Payer: Self-pay | Admitting: Internal Medicine

## 2021-07-15 ENCOUNTER — Other Ambulatory Visit: Payer: Self-pay | Admitting: Cardiology

## 2021-07-30 ENCOUNTER — Encounter (HOSPITAL_COMMUNITY)
Admission: RE | Admit: 2021-07-30 | Discharge: 2021-07-30 | Disposition: A | Discharge status: Home / Self Care | Payer: Medicare Other | Source: Ambulatory Visit | Attending: Ophthalmology | Admitting: Ophthalmology

## 2021-07-30 ENCOUNTER — Encounter (HOSPITAL_COMMUNITY): Payer: Self-pay

## 2021-07-30 ENCOUNTER — Other Ambulatory Visit: Payer: Self-pay

## 2021-07-31 NOTE — H&P (Signed)
Surgical History & Physical ? ?Patient Name: Kyle Patel DOB: [birth year] ? ?Surgery: Cataract extraction with intraocular lens implant phacoemulsification; Right Eye ? ?Surgeon: Baruch Goldmann MD ?Surgery Date:  08-05-21 ?Pre-Op Date:  07-29-21 ? ?HPI: ?A 20 Yr. old male patient 1. The patient is returning for a HVF 24-2 and DFE (cataract check) 2 months follow-up of both eyes. Since the last visit, the affected area is worsening. The patient's vision is blurry. Patient is following medication instructions. The patient experiences no eye pain and no flashes, floater, shadow, curtain or veil. The patient states that the vision in his right eye is very blurry and getting worse. He has trouble driving and does not feel safe doing so. This is negatively affecting the patient's quality of life and the patient is unable to function adequately in life with the current level of vision. HPI was performed by Baruch Goldmann . ? ?Medical History: ?Glaucoma ?Cataracts ?Macula Degeneration ?Glaucoma ?Heart Problem ?High Blood Pressure ?LDL ? ?Review of Systems ?Negative Allergic/Immunologic ?Negative Cardiovascular ?Negative Constitutional ?Negative Ear, Nose, Mouth & Throat ?Negative Endocrine ?Negative Eyes ?Negative Gastrointestinal ?Negative Genitourinary ?Negative Hemotologic/Lymphatic ?Negative Integumentary ?Negative Musculoskeletal ?Negative Neurological ?Negative Psychiatry ?Negative Respiratory ? ?Social ?  Former smoker  ? ?Medication ?Cosopt, Lumigan,  ?Aspirin, Multivitamin, Calcitriol, Coenzyme Q10-vitamin E, Ezetimibe, Furosemide, Lisinopril, Metoprolol succinate, Nitroglycerin, Osteo bi flex, Pregabalin, Rosuvastatin, Sodium bicarbonate,  ? ?Sx/Procedures ?Laser Trabeculoplasty, Laser Trabeculoplasty, Laser Trabeculoplasty,  ? ?Drug Allergies  ? NKDA ? ?History & Physical: ?Heent: Cataract, Right Eye ?NECK: supple without bruits ?LUNGS: lungs clear to auscultation ?CV: regular rate and rhythm ?Abdomen: soft and  non-tender ? ?Impression & Plan: ?Assessment: ?1.  COMBINED FORMS AGE RELATED CATARACT; Both Eyes (H25.813) ?2.  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  Pinguecula; Both Eyes (H11.153) ?5.  NUCLEAR SCLEROSIS AGE RELATED; Both Eyes (H25.13) ? ?Plan: 1.  Cataract accounts for the patient's decreased vision. This visual impairment is not correctable with a tolerable change in glasses or contact lenses. Cataract surgery with an implantation of a new lens should significantly improve the visual and functional status of the patient. Discussed all risks, benefits, alternatives, and potential complications. Discussed the procedures and recovery. Patient desires to have surgery. A-scan ordered and performed today for intra-ocular lens calculations. The surgery will be performed in order to improve vision for driving, reading, and for eye examinations. Recommend phacoemulsification with intra-ocular lens. Recommend Dextenza for post-operative pain and inflammation. ?Right Eye worse - first. ?Dilates well - shugarcaine by protocol. ?[place]. ?Recommend iAccess goniotomy both eyes at the time of cataract surgery to improve compliance and help lower eye pressure. ? ?2.  S/P SLT in 2017. ?gonio shows open angles. ?Thick corneas. ?IOPs well under control today. ? ?HVF 24-2 shows depressions OU 1/23 ?OCT rNFL WNL OD, borderline OS 1/23 ? ?Continue Cosopt generic 1 drop every 12 hours OD. ?Continue Lumigan 1 drop every night at bedtime, right eye. ? ?Patient has difficulty with remembering drops - compliance is an issue. ?Recommend iAccess goniotomy both eyes at the time of cataract surgery to improve compliance and help lower eye pressure. ? ?3.  Recommend regular lid cleaning. ? ?4.  Observe; Artificial tears as needed for irritation. ? ?5.  ?

## 2021-08-05 ENCOUNTER — Encounter (HOSPITAL_COMMUNITY)
Admission: RE | Disposition: A | Discharge status: Home / Self Care | Payer: Self-pay | Source: Home / Self Care | Attending: Ophthalmology

## 2021-08-05 ENCOUNTER — Other Ambulatory Visit: Payer: Self-pay

## 2021-08-05 ENCOUNTER — Ambulatory Visit (HOSPITAL_COMMUNITY): Payer: Medicare Other | Admitting: Certified Registered"

## 2021-08-05 ENCOUNTER — Encounter (HOSPITAL_COMMUNITY): Payer: Self-pay | Admitting: Ophthalmology

## 2021-08-05 ENCOUNTER — Ambulatory Visit (HOSPITAL_COMMUNITY)
Admission: RE | Admit: 2021-08-05 | Discharge: 2021-08-05 | Disposition: A | Discharge status: Home / Self Care | Payer: Medicare Other | Attending: Ophthalmology | Admitting: Ophthalmology

## 2021-08-05 SURGERY — PHACOEMULSIFICATION, CATARACT, WITH IOL INSERTION
Anesthesia: Monitor Anesthesia Care | Laterality: Right

## 2021-08-05 MED ORDER — TRYPAN BLUE 0.06 % IO SOSY
PREFILLED_SYRINGE | INTRAOCULAR | Status: AC
  Filled 2021-08-05: qty 0.5

## 2021-08-05 MED ORDER — PHENYLEPHRINE HCL 2.5 % OP SOLN: 1.0000 [drp] | OPHTHALMIC | Status: DC | PRN

## 2021-08-05 MED ORDER — TETRACAINE HCL 0.5 % OP SOLN: 1.0000 [drp] | OPHTHALMIC | Status: DC | PRN

## 2021-08-05 MED ORDER — TROPICAMIDE 1 % OP SOLN
1.0000 [drp] | OPHTHALMIC | Status: DC | PRN
  Filled 2021-08-05: qty 3

## 2021-08-05 MED ORDER — EPINEPHRINE PF 1 MG/ML IJ SOLN
INTRAMUSCULAR | Status: AC
Start: 2021-08-05 — End: ?
  Filled 2021-08-05: qty 2

## 2021-08-05 MED ORDER — LIDOCAINE HCL 3.5 % OP GEL: 1.0000 "application " | Freq: Once | OPHTHALMIC | Status: DC

## 2021-08-05 SURGICAL SUPPLY — 9 items
CATARACT SUITE SIGHTPATH (MISCELLANEOUS) ×2 IMPLANT
CLOTH BEACON ORANGE TIMEOUT ST (SAFETY) ×2 IMPLANT
GLOVE SURG UNDER POLY LF SZ6.5 (GLOVE) IMPLANT
GLOVE SURG UNDER POLY LF SZ7 (GLOVE) IMPLANT
NEEDLE HYPO 18GX1.5 BLUNT FILL (NEEDLE) ×2 IMPLANT
PAD ARMBOARD 7.5X6 YLW CONV (MISCELLANEOUS) ×2 IMPLANT
RING MALYGIN 7.0 (MISCELLANEOUS) IMPLANT
SYR TB 1ML LL NO SAFETY (SYRINGE) ×2 IMPLANT
WATER STERILE IRR 250ML POUR (IV SOLUTION) ×2 IMPLANT

## 2021-08-05 NOTE — Anesthesia Preprocedure Evaluation (Signed)
Anesthesia Evaluation  ?Patient identified by MRN, date of birth, ID band ?Patient awake ? ? ? ?Reviewed: ?Allergy & Precautions, H&P , NPO status , Patient's Chart, lab work & pertinent test results, reviewed documented beta blocker date and time  ? ?Airway ?Mallampati: II ? ?TM Distance: >3 FB ?Neck ROM: full ? ? ? Dental ?no notable dental hx. ? ?  ?Pulmonary ?COPD, former smoker,  ?  ?Pulmonary exam normal ?breath sounds clear to auscultation ? ? ? ? ? ? Cardiovascular ?Exercise Tolerance: Good ?hypertension, + CAD and + Past MI  ? ?Rhythm:regular Rate:Normal ? ? ?  ?Neuro/Psych ? Neuromuscular disease negative psych ROS  ? GI/Hepatic ?negative GI ROS, Neg liver ROS,   ?Endo/Other  ?negative endocrine ROS ? 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  ?Musculoskeletal ? ? Abdominal ?  ?Peds ? Hematology ?negative hematology ROS ?(+)   ?Anesthesia Other Findings ? ? Reproductive/Obstetrics ?negative OB ROS ? ?  ? ? ? ? ? ? ? ? ? ? ? ? ? ?  ?  ? ? ? ? ? ? ? ? ?Anesthesia Physical ?Anesthesia Plan ? ?ASA: 3 ? ?Anesthesia Plan: MAC  ? ?Post-op Pain Management:   ? ?Induction:  ? ?PONV Risk Score and Plan:  ? ?Airway Management Planned:  ? ?Additional Equipment:  ? ?Intra-op Plan:  ? ?Post-operative Plan:  ? ?Informed Consent: I have reviewed the patients History and Physical, chart, labs and discussed the procedure including the risks, benefits and alternatives for the proposed anesthesia with the patient or authorized representative who has indicated his/her understanding and acceptance.  ? ? ? ?Dental Advisory Given ? ?Plan Discussed with: CRNA ? ?Anesthesia Plan Comments:   ? ? ? ? ? ? ?Anesthesia Quick Evaluation ? ?

## 2021-08-09 ENCOUNTER — Ambulatory Visit: Payer: Medicare Other | Admitting: Internal Medicine

## 2021-08-14 ENCOUNTER — Encounter (HOSPITAL_COMMUNITY)
Admission: RE | Admit: 2021-08-14 | Discharge: 2021-08-14 | Disposition: A | Discharge status: Home / Self Care | Payer: Medicare Other | Source: Ambulatory Visit | Attending: Ophthalmology | Admitting: Ophthalmology

## 2021-08-14 NOTE — H&P (Signed)
Surgical History & Physical ? ?Patient Name: Kyle Patel DOB: 09-10-1945 ? ?Surgery: Cataract extraction with intraocular lens implant phacoemulsification & goniotomy (iAccess); Right Eye ? ?Surgeon: Baruch Goldmann MD ?Surgery Date:  08-19-21 ?Pre-Op Date:  08-12-21 ? ?HPI: ?A 38 Yr. old male patient 1. The patient is returning for a HVF 24-2 and DFE (cataract check) 2 months follow-up of both eyes. Since the last visit, the affected area is worsening. The patient's vision is blurry. Patient is following medication instructions. The patient experiences no eye pain and no flashes, floater, shadow, curtain or veil. The patient states that the vision in his right eye is very blurry and getting worse. He has trouble driving and does not feel safe doing so. This is negatively affecting the patient's quality of life and the patient is unable to function adequately in life with the current level of vision. HPI was performed by Baruch Goldmann . ? ?Medical History: ?Glaucoma ?Cataracts ?Macula Degeneration ?Glaucoma ?Heart Problem ?High Blood Pressure ?LDL ? ?Review of Systems ?Negative Allergic/Immunologic ?Negative Cardiovascular ?Negative Constitutional ?Negative Ear, Nose, Mouth & Throat ?Negative Endocrine ?Negative Eyes ?Negative Gastrointestinal ?Negative Genitourinary ?Negative Hemotologic/Lymphatic ?Negative Integumentary ?Negative Musculoskeletal ?Negative Neurological ?Negative Psychiatry ?Negative Respiratory ? ?Social ?  Former smoker  ? ?Medication ?Cosopt, Lumigan,  ?Aspirin, Multivitamin, Calcitriol, Coenzyme Q10-vitamin E, Ezetimibe, Furosemide, Lisinopril, Metoprolol succinate, Nitroglycerin, Osteo bi flex, Pregabalin, Rosuvastatin, Sodium bicarbonate,  ? ?Sx/Procedures ?Laser Trabeculoplasty, Laser Trabeculoplasty, Laser Trabeculoplasty,  ? ?Drug Allergies  ? NKDA ? ?History & Physical: ?Heent: Cataract, Right; Glaucoma, Right ?NECK: supple without bruits ?LUNGS: lungs clear to auscultation ?CV: regular  rate and rhythm ?Abdomen: soft and non-tender ? ?Impression & Plan: ?Assessment: ?1.  COMBINED FORMS AGE RELATED CATARACT; Both Eyes (H25.813) ?2.  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  Pinguecula; Both Eyes (H11.153) ?5.  NUCLEAR SCLEROSIS AGE RELATED; Both Eyes (H25.13) ? ?Plan: 1.  Cataract accounts for the patient's decreased vision. This visual impairment is not correctable with a tolerable change in glasses or contact lenses. Cataract surgery with an implantation of a new lens should significantly improve the visual and functional status of the patient. Discussed all risks, benefits, alternatives, and potential complications. Discussed the procedures and recovery. Patient desires to have surgery. A-scan ordered and performed today for intra-ocular lens calculations. The surgery will be performed in order to improve vision for driving, reading, and for eye examinations. Recommend phacoemulsification with intra-ocular lens. Recommend Dextenza for post-operative pain and inflammation. ?Right Eye worse - first. ?Dilates well - shugarcaine by protocol. ?Vision [place]. ?Recommend iAccess goniotomy both eyes at the time of cataract surgery to improve compliance and help lower eye pressure. ? ?2.  S/P SLT in 2017. ?gonio shows open angles. ?Thick corneas. ?IOPs well under control today. ? ?HVF 24-2 shows depressions OU 1/23 ?OCT rNFL WNL OD, borderline OS 1/23 ? ?Continue Cosopt generic 1 drop every 12 hours OD. ?Continue Lumigan 1 drop every night at bedtime, right eye. ? ?Patient has difficulty with remembering drops - compliance is an issue. ?Recommend iAccess goniotomy both eyes at the time of cataract surgery to improve compliance and help lower eye pressure. ? ?3.  Recommend regular lid cleaning. ? ?4.  Observe; Artificial tears as needed for irritation. ? ?5.  ?

## 2021-08-15 ENCOUNTER — Ambulatory Visit: Payer: Medicare Other | Admitting: Cardiology

## 2021-08-19 ENCOUNTER — Ambulatory Visit (HOSPITAL_COMMUNITY)
Admission: RE | Admit: 2021-08-19 | Discharge: 2021-08-19 | Disposition: A | Discharge status: Home / Self Care | Payer: Medicare Other | Attending: Ophthalmology | Admitting: Ophthalmology

## 2021-08-19 ENCOUNTER — Encounter (HOSPITAL_COMMUNITY)
Admission: RE | Disposition: A | Discharge status: Home / Self Care | Payer: Self-pay | Source: Home / Self Care | Attending: Ophthalmology

## 2021-08-19 ENCOUNTER — Ambulatory Visit (HOSPITAL_COMMUNITY): Payer: Medicare Other | Admitting: Certified Registered"

## 2021-08-19 ENCOUNTER — Encounter (HOSPITAL_COMMUNITY): Payer: Self-pay | Admitting: Ophthalmology

## 2021-08-19 ENCOUNTER — Ambulatory Visit (HOSPITAL_BASED_OUTPATIENT_CLINIC_OR_DEPARTMENT_OTHER): Payer: Medicare Other | Admitting: Certified Registered"

## 2021-08-19 DIAGNOSIS — I2510 Atherosclerotic heart disease of native coronary artery without angina pectoris: Secondary | ICD-10-CM | POA: Insufficient documentation

## 2021-08-19 DIAGNOSIS — H25811 Combined forms of age-related cataract, right eye: Secondary | ICD-10-CM | POA: Insufficient documentation

## 2021-08-19 DIAGNOSIS — G709 Myoneural disorder, unspecified: Secondary | ICD-10-CM | POA: Diagnosis not present

## 2021-08-19 DIAGNOSIS — H01001 Unspecified blepharitis right upper eyelid: Secondary | ICD-10-CM | POA: Insufficient documentation

## 2021-08-19 DIAGNOSIS — I252 Old myocardial infarction: Secondary | ICD-10-CM

## 2021-08-19 DIAGNOSIS — H11153 Pinguecula, bilateral: Secondary | ICD-10-CM | POA: Insufficient documentation

## 2021-08-19 DIAGNOSIS — J449 Chronic obstructive pulmonary disease, unspecified: Secondary | ICD-10-CM | POA: Insufficient documentation

## 2021-08-19 DIAGNOSIS — Z87891 Personal history of nicotine dependence: Secondary | ICD-10-CM | POA: Diagnosis not present

## 2021-08-19 DIAGNOSIS — H401111 Primary open-angle glaucoma, right eye, mild stage: Secondary | ICD-10-CM | POA: Insufficient documentation

## 2021-08-19 DIAGNOSIS — H01004 Unspecified blepharitis left upper eyelid: Secondary | ICD-10-CM | POA: Insufficient documentation

## 2021-08-19 DIAGNOSIS — N189 Chronic kidney disease, unspecified: Secondary | ICD-10-CM | POA: Diagnosis not present

## 2021-08-19 DIAGNOSIS — I129 Hypertensive chronic kidney disease with stage 1 through stage 4 chronic kidney disease, or unspecified chronic kidney disease: Secondary | ICD-10-CM | POA: Diagnosis not present

## 2021-08-19 HISTORY — PX: CATARACT EXTRACTION W/PHACO: SHX586

## 2021-08-19 SURGERY — PHACOEMULSIFICATION, CATARACT, WITH IOL INSERTION
Anesthesia: Monitor Anesthesia Care | Site: Eye | Laterality: Right

## 2021-08-19 MED ORDER — BSS IO SOLN
INTRAOCULAR | Status: DC | PRN
  Administered 2021-08-19: 15 mL via INTRAOCULAR

## 2021-08-19 MED ORDER — TROPICAMIDE 1 % OP SOLN
1.0000 [drp] | OPHTHALMIC | Status: AC | PRN
  Administered 2021-08-19 (×3): 1 [drp] via OPHTHALMIC

## 2021-08-19 MED ORDER — TRYPAN BLUE 0.06 % IO SOSY
PREFILLED_SYRINGE | INTRAOCULAR | Status: AC
  Filled 2021-08-19: qty 0.5

## 2021-08-19 MED ORDER — EPINEPHRINE PF 1 MG/ML IJ SOLN
INTRAOCULAR | Status: DC | PRN
  Administered 2021-08-19: 500 mL

## 2021-08-19 MED ORDER — SODIUM HYALURONATE 10 MG/ML IO SOLUTION
PREFILLED_SYRINGE | INTRAOCULAR | Status: DC | PRN
  Administered 2021-08-19: 0.85 mL via INTRAOCULAR

## 2021-08-19 MED ORDER — PHENYLEPHRINE HCL 2.5 % OP SOLN
1.0000 [drp] | OPHTHALMIC | Status: AC | PRN
  Administered 2021-08-19 (×3): 1 [drp] via OPHTHALMIC

## 2021-08-19 MED ORDER — LIDOCAINE HCL 3.5 % OP GEL
1.0000 "application " | Freq: Once | OPHTHALMIC | Status: AC
  Administered 2021-08-19: 1 via OPHTHALMIC

## 2021-08-19 MED ORDER — EPINEPHRINE PF 1 MG/ML IJ SOLN
INTRAMUSCULAR | Status: AC
  Filled 2021-08-19: qty 2

## 2021-08-19 MED ORDER — TETRACAINE HCL 0.5 % OP SOLN
OPHTHALMIC | Status: AC
  Filled 2021-08-19: qty 4

## 2021-08-19 MED ORDER — SODIUM HYALURONATE 23MG/ML IO SOSY
PREFILLED_SYRINGE | INTRAOCULAR | Status: DC | PRN
  Administered 2021-08-19: 0.6 mL via INTRAOCULAR

## 2021-08-19 MED ORDER — STERILE WATER FOR IRRIGATION IR SOLN
Status: DC | PRN
  Administered 2021-08-19: 250 mL

## 2021-08-19 MED ORDER — POVIDONE-IODINE 5 % OP SOLN
OPHTHALMIC | Status: DC | PRN
  Administered 2021-08-19: 1 via OPHTHALMIC

## 2021-08-19 MED ORDER — LIDOCAINE HCL (PF) 1 % IJ SOLN
INTRAMUSCULAR | Status: DC | PRN
  Administered 2021-08-19: 1 mL via OPHTHALMIC

## 2021-08-19 MED ORDER — NEOMYCIN-POLYMYXIN-DEXAMETH 3.5-10000-0.1 OP SUSP
OPHTHALMIC | Status: DC | PRN
  Administered 2021-08-19: 1 [drp] via OPHTHALMIC

## 2021-08-19 MED ORDER — TETRACAINE HCL 0.5 % OP SOLN
1.0000 [drp] | OPHTHALMIC | Status: AC | PRN
  Administered 2021-08-19 (×3): 1 [drp] via OPHTHALMIC

## 2021-08-19 SURGICAL SUPPLY — 18 items
CATARACT SUITE SIGHTPATH (MISCELLANEOUS) ×2 IMPLANT
CLIP IPRISM (KITS) ×1 IMPLANT
CLOTH BEACON ORANGE TIMEOUT ST (SAFETY) ×2 IMPLANT
EYE SHIELD UNIVERSAL CLEAR (GAUZE/BANDAGES/DRESSINGS) ×1 IMPLANT
FEE CATARACT SUITE SIGHTPATH (MISCELLANEOUS) ×1 IMPLANT
GLOVE SURG UNDER POLY LF SZ6.5 (GLOVE) IMPLANT
GLOVE SURG UNDER POLY LF SZ7 (GLOVE) ×1 IMPLANT
LENS IOL RAYNER 20.0 (Intraocular Lens) ×2 IMPLANT
LENS IOL RAYONE EMV 20.0 (Intraocular Lens) IMPLANT
NDL HYPO 18GX1.5 BLUNT FILL (NEEDLE) ×1 IMPLANT
NEEDLE HYPO 18GX1.5 BLUNT FILL (NEEDLE) ×2 IMPLANT
PAD ARMBOARD 7.5X6 YLW CONV (MISCELLANEOUS) ×2 IMPLANT
RING MALYGIN 7.0 (MISCELLANEOUS) IMPLANT
SYR TB 1ML LL NO SAFETY (SYRINGE) ×2 IMPLANT
TAPE SURG TRANSPORE 1 IN (GAUZE/BANDAGES/DRESSINGS) IMPLANT
TAPE SURGICAL TRANSPORE 1 IN (GAUZE/BANDAGES/DRESSINGS) ×2
TREPHINE GLAUKO IACCESS TRBCLR (OPHTHALMIC) ×1 IMPLANT
WATER STERILE IRR 250ML POUR (IV SOLUTION) ×2 IMPLANT

## 2021-08-19 NOTE — Discharge Instructions (Signed)
Please discharge patient when stable, will follow up today with Dr. Advith Martine at the [facility] office immediately following discharge.  Leave shield in place until visit.  All paperwork with discharge instructions will be given at the office.  [facility] Address:  [address]  [place], [place] [address]  

## 2021-08-19 NOTE — Op Note (Signed)
Date of procedure: 08/19/21 ? ?Pre-operative diagnosis:  ?1) Visually significant age-related combined cataract, Right Eye (H25.811) ?2) Primary Open Angle Glaucoma, mild Stage, Right Eye H40.1111 ? ?Post-operative diagnosis: ?1) Visually significant age-related cataract, Right Eye ?2) Primary Open Angle Glaucoma, mild Stage, Right Eye ? ?Procedure:  ?1) Removal of cataract via phacoemulsification and insertion of intra-ocular lens Rayner [id number] +20.0D into the capsular bag of the Right Eye ?2) Goniotomy treatment with iAccess of the trabecular meshwork of the right eye ? ?Attending surgeon: Gerda Diss. Marisa Hua, MD, MA ? ?Anesthesia: MAC, Topical [place] ? ?Complications: None ? ?Estimated Blood Loss: <24m (minimal) ? ?Specimens: None ? ?Implants: As above ? ?Indications:  Visually significant age-related cataract, Right Eye; Glaucoma, Right Eye ? ?Procedure:  ?The patient was seen and identified in the pre-operative area. The operative eye was identified and dilated.  The operative eye was marked.  Topical anesthesia was administered to the operative eye.    ? ?The patient was then to the operative suite and placed in the supine position.  A timeout was performed confirming the patient, procedure to be performed, and all other relevant information.   The patient's face was prepped and draped in the usual fashion for intra-ocular surgery.  A lid speculum was placed into the operative eye and the surgical microscope moved into place and focused.  A superotemporal paracentesis was created using a 20 gauge paracentesis blade.  Shugarcaine was injected into the anterior chamber.  Viscoelastic was injected into the anterior chamber.  A temporal clear-corneal main wound incision was created using a 2.440mmicrokeratome.  A continuous curvilinear capsulorrhexis was initiated using an irrigating cystitome and completed using capsulorrhexis forceps.  Hydrodissection and hydrodeliniation were performed.  Viscoelastic was injected  into the anterior chamber.  A phacoemulsification handpiece and a chopper as a second instrument were used to remove the nucleus and epinucleus. The irrigation/aspiration handpiece was used to remove any remaining cortical material.  ? ?The capsular bag was reinflated with viscoelastic, checked, and found to be intact.  The intraocular lens was inserted into the capsular bag and dialed into place using a Kuglen hook.   ? ?The patient's head was repositioned.  90 degrees of the nasal trabecular meshwork was treated extensively with the iAccess goniotomy handpiece.  ? ?The patient's head was returned to neutral.  The irrigation/aspiration handpiece was used to remove any remaining viscoelastic.  The clear corneal wound and paracentesis wounds were then hydrated and checked with Weck-Cels to be watertight.  The lid-speculum was removed.  The drape was removed.  The patient's face was cleaned with a wet and dry 4x4.  Maxitrol was instilled in the eye before a clear shield was taped over the eye. The patient was taken to the post-operative care unit in good condition, having tolerated the procedure well. ? ?[facility]-Op Instructions: [facility] and will receive all other orders and instructions. ? ?

## 2021-08-19 NOTE — Anesthesia Procedure Notes (Addendum)
Procedure Name: [place] ?Date/Time: 08/19/2021 1:19 PM ?Performed by: Orlie Dakin, CRNA ?Pre-anesthesia Checklist: Patient identified, Emergency Drugs available, Suction available and Patient being monitored ?Patient Re-evaluated:Patient Re-evaluated prior to induction ?Oxygen Delivery Method: Nasal cannula ?Placement Confirmation: positive ETCO2 ? ? ? ? ?

## 2021-08-19 NOTE — Transfer of Care (Signed)
Immediate Anesthesia Transfer of Care Note ? ?Patient: Kyle Patel. ? ?Procedure(s) Performed: CATARACT EXTRACTION PHACO AND INTRAOCULAR LENS PLACEMENT (IOC) (Right: Eye) ?Goniotomy (Right: Eye) ? ?Patient Location: Short Stay ? ?Anesthesia Type:MAC ? ?Level of Consciousness: awake, alert  and oriented ? ?Airway & Oxygen Therapy: Patient Spontanous Breathing ? ?Post-op Assessment: Report given to RN and Post -op Vital signs reviewed and stable ? ?Post vital signs: Reviewed and stable ? ?Last Vitals:  ?Vitals Value Taken Time  ?BP    ?Temp    ?Pulse    ?Resp    ?SpO2    ? ? ?Last Pain:  ?Vitals:  ? 08/19/21 1258  ?TempSrc: Oral  ?PainSc: 3   ?   ? ?Patients Stated Pain Goal: 8 (08/19/21 1258) ? ?Complications: No notable events documented. ?

## 2021-08-19 NOTE — Interval H&P Note (Signed)
History and Physical Interval Note: ? ?08/19/2021 ?1:07 PM ? ?Kyle Patel.  has presented today for surgery, with the diagnosis of combined forms age related cataract; right ?glaucoma; right.  The various methods of treatment have been discussed with the patient and family. After consideration of risks, benefits and other options for treatment, the patient has consented to  Procedure(s) with comments: ?CATARACT EXTRACTION PHACO AND INTRAOCULAR LENS PLACEMENT (IOC) (Right) - last case ?Goniotomy (Right) as a surgical intervention.  The patient's history has been reviewed, patient examined, no change in status, stable for surgery.  I have reviewed the patient's chart and labs.  Questions were answered to the patient's satisfaction.   ? ? ?Baruch Goldmann ? ? ?

## 2021-08-19 NOTE — Anesthesia Preprocedure Evaluation (Signed)
Anesthesia Evaluation  ?Patient identified by MRN, date of birth, ID band ?Patient awake ? ? ? ?Reviewed: ?Allergy & Precautions, H&P , NPO status , Patient's Chart, lab work & pertinent test results, reviewed documented beta blocker date and time  ? ?Airway ?Mallampati: II ? ?TM Distance: >3 FB ?Neck ROM: full ? ? ? Dental ?no notable dental hx. ? ?  ?Pulmonary ?COPD, former smoker,  ?  ?Pulmonary exam normal ?breath sounds clear to auscultation ? ? ? ? ? ? Cardiovascular ?Exercise Tolerance: Good ?hypertension, + CAD and + Past MI  ? ?Rhythm:regular Rate:Normal ? ? ?  ?Neuro/Psych ? Neuromuscular disease negative psych ROS  ? GI/Hepatic ?negative GI ROS, Neg liver ROS,   ?Endo/Other  ?negative endocrine ROS ? 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  ?Musculoskeletal ? ? Abdominal ?  ?Peds ? Hematology ?negative hematology ROS ?(+)   ?Anesthesia Other Findings ? ? Reproductive/Obstetrics ?negative OB ROS ? ?  ? ? ? ? ? ? ? ? ? ? ? ? ? ?  ?  ? ? ? ? ? ? ? ? ?Anesthesia Physical ? ?Anesthesia Plan ? ?ASA: 3 ? ?Anesthesia Plan: MAC  ? ?Post-op Pain Management:   ? ?Induction:  ? ?PONV Risk Score and Plan:  ? ?Airway Management Planned:  ? ?Additional Equipment:  ? ?Intra-op Plan:  ? ?Post-operative Plan:  ? ?Informed Consent: I have reviewed the patients History and Physical, chart, labs and discussed the procedure including the risks, benefits and alternatives for the proposed anesthesia with the patient or authorized representative who has indicated his/her understanding and acceptance.  ? ? ? ?Dental Advisory Given ? ?Plan Discussed with: CRNA ? ?Anesthesia Plan Comments:   ? ? ? ? ? ? ?Anesthesia Quick Evaluation ? ?

## 2021-08-20 NOTE — Anesthesia Postprocedure Evaluation (Signed)
Anesthesia Post Note ? ?Patient: Kyle Patel. ? ?Procedure(s) Performed: CATARACT EXTRACTION PHACO AND INTRAOCULAR LENS PLACEMENT (IOC) (Right: Eye) ?Goniotomy (Right: Eye) ? ?Patient location during evaluation: Phase II ?Anesthesia Type: MAC ?Level of consciousness: awake ?Pain management: pain level controlled ?Vital Signs Assessment: post-procedure vital signs reviewed and stable ?Respiratory status: spontaneous breathing and respiratory function stable ?Cardiovascular status: blood pressure returned to baseline and stable ?Postop Assessment: no headache and no apparent nausea or vomiting ?Anesthetic complications: no ?Comments: Late entry ? ? ?No notable events documented. ? ? ?Last Vitals:  ?Vitals:  ? 08/19/21 1258 08/19/21 1340  ?BP: 140/64 (!) 150/72  ?Pulse: (!) 49 (!) 51  ?Resp: 14 14  ?Temp: 36.9 ?C 37.1 ?C  ?SpO2: 98% 96%  ?  ?Last Pain:  ?Vitals:  ? 08/19/21 1340  ?TempSrc: Oral  ?PainSc: 0-No pain  ? ? ?  ?  ?  ?  ?  ?  ? ?Louann Sjogren ? ? ? ? ?

## 2021-08-21 ENCOUNTER — Encounter (HOSPITAL_COMMUNITY): Payer: Self-pay | Admitting: Ophthalmology

## 2021-09-02 ENCOUNTER — Ambulatory Visit: Payer: Medicare Other | Admitting: Internal Medicine

## 2021-09-02 ENCOUNTER — Telehealth: Payer: Self-pay | Admitting: Cardiology

## 2021-09-02 ENCOUNTER — Encounter: Payer: Self-pay | Admitting: Internal Medicine

## 2021-09-02 ENCOUNTER — Encounter (INDEPENDENT_AMBULATORY_CARE_PROVIDER_SITE_OTHER): Payer: Self-pay

## 2021-09-02 VITALS — BP 132/82 | HR 61 | Resp 18 | Ht 69.5 in | Wt 204.8 lb

## 2021-09-02 DIAGNOSIS — I25110 Atherosclerotic heart disease of native coronary artery with unstable angina pectoris: Secondary | ICD-10-CM | POA: Diagnosis not present

## 2021-09-02 DIAGNOSIS — G609 Hereditary and idiopathic neuropathy, unspecified: Secondary | ICD-10-CM

## 2021-09-02 DIAGNOSIS — N1832 Chronic kidney disease, stage 3b: Secondary | ICD-10-CM | POA: Diagnosis not present

## 2021-09-02 DIAGNOSIS — I2510 Atherosclerotic heart disease of native coronary artery without angina pectoris: Secondary | ICD-10-CM | POA: Insufficient documentation

## 2021-09-02 DIAGNOSIS — E782 Mixed hyperlipidemia: Secondary | ICD-10-CM

## 2021-09-02 DIAGNOSIS — Z23 Encounter for immunization: Secondary | ICD-10-CM

## 2021-09-02 DIAGNOSIS — I10 Essential (primary) hypertension: Secondary | ICD-10-CM

## 2021-09-02 NOTE — Telephone Encounter (Signed)
Looks like Metoprolol was supposed to be stopped but was not. Please advise.  ?

## 2021-09-02 NOTE — Assessment & Plan Note (Signed)
Followed by nephrology - Dr. Theador Hawthorne ?On lisinopril and Farxiga ?On Lasix every other day ?On calcitriol and sodium bicarb ?

## 2021-09-02 NOTE — Assessment & Plan Note (Signed)
On Lyrica 150 mg BID, would prefer to avoid increasing dose for now ?

## 2021-09-02 NOTE — Telephone Encounter (Signed)
Kyle Patel walks in following is appt with Dr. Posey Pronto. He said Posey Pronto is questioning why he is taking both metoprolol and, carvedilol. Please advise  ?

## 2021-09-02 NOTE — Telephone Encounter (Signed)
? ? ?  We switched from Toprol-XL to Coreg at the time of his nurse visit on 03/08/2021. For some reason, there is a note on 06/03/2021 saying his dose was increased to '50mg'$  daily on 02/22/2021 but after that visit, this was stopped and switched to [place]. He should only be on Coreg. Please verify this with his pharmacy so they quit filling his Toprol-XL.  ? ?Signed, ?Erma Heritage, PA-C ?09/02/2021, 4:54 PM ? ? ?

## 2021-09-02 NOTE — Progress Notes (Signed)
? ?New Patient Office Visit ? ?Subjective:  ?Patient ID: Kyle Lege., male    DOB: Oct 10, 1945  Age: 76 y.o. MRN: [id number] ? ?CC:  ?Chief Complaint  ?Patient presents with  ? New Patient (Initial Visit)  ?  New patient was seeing dr hall pt stays sore in legs all the time this is on and off   ? ? ?HPI ?Kyle Patel. is a 76 y.o. male with past medical history of CAD, HTN, CKD, peripheral neuropathy and HLD who presents for establishing care. ? ?HTN and CAD: He has had stent placed for history of NSTEMI in 2018.  He is on aspirin and statin currently. BP is well-controlled. Takes medications regularly. Patient denies headache, dizziness, chest pain, dyspnea or palpitations. ? ?CKD stage IIIb: Followed by Dr. Theador Hawthorne.  His last BMP showed GFR of 44.  He currently denies any dysuria, hematuria, urinary hesitance or resistance. ? ?He has history of uncontrolled peripheral neuropathy, for which he takes Lyrica 150 mg twice daily.  He has chronic burning pain in his feet and also complains of intermittent weakness of the LE.  Denies any recent fall. ? ?He received PCV20 in the office today. ? ?Past Medical History:  ?Diagnosis Date  ? Acute anterior wall MI ([place]) 04/24/2017  ? Arthritis   ? AVN of femur ([place])   ? CAD (coronary artery disease)   ? a. s/p NSTEMI in 03/2017 with 100% distal LAD stenosis treated with POBA as stent could not be delivered  ? COPD (chronic obstructive pulmonary disease) ([place])   ? Essential hypertension   ? Hereditary and idiopathic peripheral neuropathy 05/2014  ? Hyperlipidemia   ? Pulmonary embolism ([place]) 10/2011  ? ? ?Past Surgical History:  ?Procedure Laterality Date  ? ABDOMINAL SURGERY    ? colon polyp removed 2007- sepsis, coma for 4 months per pt  ? BIOPSY  12/24/2020  ? Procedure: BIOPSY;  Surgeon: Eloise Harman, DO;  [facility];  Service: Endoscopy;;  ? CATARACT EXTRACTION W/PHACO Right 08/19/2021  ? Procedure: CATARACT EXTRACTION PHACO AND INTRAOCULAR LENS  PLACEMENT (IOC);  Surgeon: Baruch Goldmann, MD;  Location: [facility];  Service: Ophthalmology;  Laterality: Right;  CDE: 7.02 ?  ? COLONOSCOPY  03/22/2010  ? RMR: 1. Normal rectum 2. Diminutive polyp at the mouth of the ileocecal valve  status post cold biopsy removal. Remaideer of the colonic mucosa and terminal ileum mucosa appeared normal.   ? COLONOSCOPY N/A 06/25/2015  ? Procedure: COLONOSCOPY;  Surgeon: Daneil Dolin, MD;  [facility];  Service: Endoscopy;  Laterality: N/A;  200-rescheduled 1/30 per [place]   ? COLONOSCOPY WITH PROPOFOL N/A 12/24/2020  ? Procedure: COLONOSCOPY WITH PROPOFOL;  Surgeon: Eloise Harman, DO;  [facility];  Service: Endoscopy;  Laterality: N/A;  ASA III / 8:00  ? CORONARY BALLOON ANGIOPLASTY N/A 04/24/2017  ? Procedure: CORONARY BALLOON ANGIOPLASTY;  Surgeon: Jettie Booze, MD;  Location: [facility];  Service: Cardiovascular;  Laterality: N/A;  ? LEFT HEART CATH AND CORONARY ANGIOGRAPHY N/A 04/24/2017  ? Procedure: LEFT HEART CATH AND CORONARY ANGIOGRAPHY;  Surgeon: Jettie Booze, MD;  Location: [facility];  Service: Cardiovascular;  Laterality: N/A;  ? LUMBAR LAMINECTOMY  06/02/2012  ? LUMBAR LAMINECTOMY/DECOMPRESSION MICRODISCECTOMY  06/02/2012  ? Procedure: LUMBAR LAMINECTOMY/DECOMPRESSION MICRODISCECTOMY 1 LEVEL;  Surgeon: Floyce Stakes, MD;  Location: [facility];  Service: Neurosurgery;  Laterality: Bilateral;  Bilateral Lumbar five-sacral one  Foraminotomy and microdiskectomy ?  ? POLYPECTOMY  12/24/2020  ? Procedure: POLYPECTOMY;  Surgeon: Eloise Harman, DO;  [facility];  Service: Endoscopy;;  ? ? ?Family History  ?Problem Relation Age of Onset  ? Heart attack Mother   ? Diabetes Mother   ? Stroke Father   ? Heart attack Father   ? Arthritis Other   ? Colon cancer Sister 70  ?     Estimated age of onset; just found out about it in 2015.  ? Neuropathy Neg Hx   ? ? ?Social History  ? ?Socioeconomic History  ?  Marital status: Married  ?  Spouse name: Not on file  ? Number of children: 1  ? Years of education: Not on file  ? Highest education level: Not on file  ?Occupational History  ? Occupation: retired  ?Tobacco Use  ? Smoking status: Former  ?  Packs/day: 0.10  ?  Years: 20.00  ?  Pack years: 2.00  ?  Types: Cigarettes  ?  Quit date: 05/08/1999  ?  Years since quitting: 22.3  ? Smokeless tobacco: Never  ?Vaping Use  ? Vaping Use: Never used  ?Substance and Sexual Activity  ? Alcohol use: No  ? Drug use: No  ? Sexual activity: Yes  ?Other Topics Concern  ? Not on file  ?Social History Narrative  ? Patient is right handed.  ? Patient drinks caffeine occasionally  ? ?Social Determinants of Health  ? ?Financial Resource Strain: Not on file  ?Food Insecurity: Not on file  ?Transportation Needs: Not on file  ?Physical Activity: Not on file  ?Stress: Not on file  ?Social Connections: Not on file  ?Intimate Partner Violence: Not on file  ? ? ?ROS ?Review of Systems  ?Constitutional:  Negative for chills and fever.  ?HENT:  Negative for congestion and sore throat.   ?Eyes:  Negative for pain and discharge.  ?Respiratory:  Negative for cough and shortness of breath.   ?Cardiovascular:  Negative for chest pain and palpitations.  ?Gastrointestinal:  Negative for diarrhea, nausea and vomiting.  ?Endocrine: Negative for polydipsia and polyuria.  ?Genitourinary:  Negative for dysuria and hematuria.  ?Musculoskeletal:  Negative for neck pain and neck stiffness.  ?Skin:  Negative for rash.  ?Neurological:  Positive for weakness and numbness. Negative for dizziness and headaches.  ?Psychiatric/Behavioral:  Negative for agitation and behavioral problems.   ? ?Objective:  ? ?Today's Vitals: BP 132/82 (BP Location: Left Arm, Patient Position: Sitting, Cuff Size: Normal)   Pulse 61   Resp 18   Ht 5' 9.5" (1.765 m)   Wt 204 lb 12.8 oz (92.9 kg)   SpO2 92%   BMI 29.81 kg/m?  ? ?Physical Exam ?Vitals reviewed.  ?Constitutional:   ?    General: He is not in acute distress. ?   Appearance: He is not diaphoretic.  ?HENT:  ?   Head: Normocephalic and atraumatic.  ?   Nose: Nose normal.  ?   Mouth/Throat:  ?   Mouth: Mucous membranes are moist.  ?Eyes:  ?   General: No scleral icterus. ?   Extraocular Movements: Extraocular movements intact.  ?Cardiovascular:  ?   Rate and Rhythm: Normal rate and regular rhythm.  ?   Pulses: Normal pulses.  ?   Heart sounds: Normal heart sounds. No murmur heard. ?Pulmonary:  ?   Breath sounds: Normal breath sounds. No wheezing or rales.  ?Musculoskeletal:  ?   Cervical back: Neck supple.  No tenderness.  ?   Right lower leg: No edema.  ?   Left lower leg: No edema.  ?Skin: ?   General: Skin is warm.  ?   Findings: No rash.  ?Neurological:  ?   General: No focal deficit present.  ?   Mental Status: He is alert and oriented to person, place, and time.  ?   Sensory: No sensory deficit.  ?   Motor: Weakness (4/5 in b/l LE) present.  ?Psychiatric:     ?   Mood and Affect: Mood normal.     ?   Behavior: Behavior normal.  ? ? ?Assessment & Plan:  ? ?Problem List Items Addressed This Visit   ? ?  ? Cardiovascular and Mediastinum  ? Hypertension  ?  BP Readings from Last 1 Encounters:  ?09/02/21 132/82  ?Well-controlled ?Counseled for compliance with the medications ?Advised DASH diet and moderate exercise/walking as tolerated ?  ?  ? CAD (coronary artery disease)  ?  S/p stent placement ?On aspirin and statin ?Needs to clarify with cardiology as chart review suggest that he is on metoprolol and Coreg currently ?  ?  ?  ? Nervous and Auditory  ? Idiopathic peripheral neuropathy  ?  On Lyrica 150 mg BID, would prefer to avoid increasing dose for now ?  ?  ?  ? Genitourinary  ? CKD (chronic kidney disease) - Primary  ?  Followed by nephrology - Dr. Theador Hawthorne ?On lisinopril and Farxiga ?On Lasix every other day ?On calcitriol and sodium bicarb ?  ?  ?  ? Other  ? Hyperlipidemia  ?  On statin and Zetia currently ?  ?  ? ? ?Outpatient  Encounter Medications as of 09/02/2021  ?Medication Sig  ? amLODipine (NORVASC) 5 MG tablet Take 1 tablet (5 mg total) by mouth daily.  ? aspirin 81 MG EC tablet Take 81 mg by mouth daily.  ? calcitRIOL (ROCALTROL)

## 2021-09-02 NOTE — Assessment & Plan Note (Signed)
On statin and Zetia currently 

## 2021-09-02 NOTE — Assessment & Plan Note (Signed)
S/p stent placement ?On aspirin and statin ?Needs to clarify with cardiology as chart review suggest that he is on metoprolol and Coreg currently ?

## 2021-09-02 NOTE — Assessment & Plan Note (Signed)
BP Readings from Last 1 Encounters:  ?09/02/21 132/82  ? ?Well-controlled ?Counseled for compliance with the medications ?Advised DASH diet and moderate exercise/walking as tolerated ?

## 2021-09-02 NOTE — Patient Instructions (Signed)
Please check with your Cardiologist about Carvedilol and Metoprolol. ? ?Please continue taking other medications as prescribed. ? ?Please continue to follow low salt diet and ambulate as tolerated. ?

## 2021-09-03 DIAGNOSIS — Z23 Encounter for immunization: Secondary | ICD-10-CM | POA: Diagnosis not present

## 2021-09-03 NOTE — Addendum Note (Signed)
Addended by: Zacarias Pontes R on: 09/03/2021 08:28 AM ? ? Modules accepted: Orders ? ?

## 2021-09-03 NOTE — Telephone Encounter (Signed)
Patient notified and verbalized understanding. Pt had no further questions or concerns at this time.  ?

## 2021-09-03 NOTE — Telephone Encounter (Signed)
Left a message for pt to call our office regarding medications. Toprol-XL removed from pt's chart. Will update pharmacy when they open at 9 am.  ?

## 2021-09-20 ENCOUNTER — Telehealth: Payer: Self-pay | Admitting: Internal Medicine

## 2021-09-20 NOTE — Telephone Encounter (Signed)
[facility] Nurse with [facility] [phone])  ? ?Called in on patient behalf in regard to visit. ? ?Patient had PAD testing done  ? ?L leg is good ?R led show mild / moderate PAD  ?

## 2021-09-23 NOTE — Telephone Encounter (Signed)
LVM for Kyle Patel to call the office  ?

## 2021-09-23 NOTE — H&P (Signed)
Surgical History & Physical ? ?Patient Name: Kyle Patel DOB: January 23, 1946 ? ?Surgery: Cataract extraction with intraocular lens implant phacoemulsification & Goniotomy Treatment (iAccess); Left Eye ? ?Surgeon: Baruch Goldmann MD ?Surgery Date:  09-30-21 ?Pre-Op Date:  09-23-21 ? ?HPI: ?A 50 Yr. old male patient 1. The patient is returning after cataract post-op. The right eye is affected. Status post cataract post-op, which began 1 week ago: Since the last visit, the affected area is doing well. The patient's vision is improved. Patient is following medication instructions. The patient complains of cloudiness in left eye, which began 2 years ago. The episode is gradual. The condition's severity is worsening. The complaint is associated with blurry vision and glare. This is negatively affecting the patient's quality of life and the patient is unable to function adequately in life with the current level of vision. HPI Completed by Dr. Baruch Goldmann ? ?Medical History: ?Glaucoma ?Cataracts ?Macula Degeneration ?Heart Problem ?High Blood Pressure ?LDL ? ?Review of Systems ?Negative Allergic/Immunologic ?Negative Cardiovascular ?Negative Constitutional ?Negative Ear, Nose, Mouth & Throat ?Negative Endocrine ?Negative Eyes ?Negative Gastrointestinal ?Negative Genitourinary ?Negative Hemotologic/Lymphatic ?Negative Integumentary ?Negative Musculoskeletal ?Negative Neurological ?Negative Psychiatry ?Negative Respiratory ? ?Social ?  Former smoker  ? ?Medication ?Cosopt, Lumigan, Prednisolone-Moxifloxacin-Bromfenac,  ?Aspirin, Multivitamin, Calcitriol, Coenzyme Q10-vitamin E, Ezetimibe, Furosemide, Lisinopril, Metoprolol succinate, Nitroglycerin, Osteo bi flex, Pregabalin, Rosuvastatin, Sodium bicarbonate,  ? ?Sx/Procedures ?Laser Trabeculoplasty, Laser Trabeculoplasty, Laser Trabeculoplasty, Phaco c IOL OD,  ?  ?Drug Allergies  ? NKDA ? ?History & Physical: ?Heent: Cataract, Left Eye & Glaucoma, Left Eye ?NECK: supple  without bruits ?LUNGS: lungs clear to auscultation ?CV: regular rate and rhythm ?Abdomen: soft and non-tender ?Impression & Plan: ?Assessment: ?1.  CATARACT EXTRACTION STATUS; Right Eye (Z98.41) ?2.  COMBINED FORMS AGE RELATED CATARACT; Both Eyes (H25.813) ?3.  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  NUCLEAR SCLEROSIS AGE RELATED; Left Eye (H25.12) ? ?Plan: 1.  1 week after cataract surgery. Doing well with improved vision. Call with any problems or concerns. ?IOP slightly elevated - to be expected after iAccess goniotomy. ?Continue Pred-Moxi-Brom 2x/day for 3 more weeks. ? ?2.  Cataract accounts for the patient's decreased vision. This visual impairment is not correctable with a tolerable change in glasses or contact lenses. Cataract surgery with an implantation of a new lens should significantly improve the visual and functional status of the patient. Discussed all risks, benefits, alternatives, and potential complications. Discussed the procedures and recovery. Patient desires to have surgery. A-scan ordered and performed today for intra-ocular lens calculations. The surgery will be performed in order to improve vision for driving, reading, and for eye examinations. Recommend phacoemulsification with intra-ocular lens. Recommend Dextenza for post-operative pain and inflammation. ?Left eye. ?Surgery required to balance vision. ?Dilates well - shugarcaine by protocol. ?Vision [place]. ?Recommend iAccess goniotomy both eyes at the time of cataract surgery to improve compliance and help lower eye pressure. ? ?3.  S/P SLT in 2017. ?gonio shows open angles. ?Thick corneas. ?IOPs well under control today. ? ?HVF 24-2 shows depressions OU 1/23 ?OCT rNFL WNL OD, borderline OS 1/23 ? ?Continue Cosopt generic 1 drop every 12 hours OD. ?Continue Lumigan 1 drop every night at bedtime, right eye. ? ?Patient has difficulty with remembering drops - compliance is an issue. ?Recommend iAccess goniotomy left eye at  the time of cataract surgery to improve compliance and help lower eye pressure. ? ?4.  ?

## 2021-09-24 ENCOUNTER — Encounter (HOSPITAL_COMMUNITY): Payer: Self-pay

## 2021-09-24 ENCOUNTER — Encounter (HOSPITAL_COMMUNITY)
Admission: RE | Admit: 2021-09-24 | Discharge: 2021-09-24 | Disposition: A | Discharge status: Home / Self Care | Payer: Medicare Other | Source: Ambulatory Visit | Attending: Ophthalmology | Admitting: Ophthalmology

## 2021-09-24 ENCOUNTER — Other Ambulatory Visit: Payer: Self-pay

## 2021-09-30 ENCOUNTER — Ambulatory Visit (HOSPITAL_COMMUNITY)
Admission: RE | Admit: 2021-09-30 | Discharge: 2021-09-30 | Disposition: A | Discharge status: Home / Self Care | Payer: Medicare Other | Attending: Ophthalmology | Admitting: Ophthalmology

## 2021-09-30 ENCOUNTER — Ambulatory Visit (HOSPITAL_BASED_OUTPATIENT_CLINIC_OR_DEPARTMENT_OTHER): Payer: Medicare Other | Admitting: Certified Registered Nurse Anesthetist

## 2021-09-30 ENCOUNTER — Encounter (HOSPITAL_COMMUNITY)
Admission: RE | Disposition: A | Discharge status: Home / Self Care | Payer: Self-pay | Source: Home / Self Care | Attending: Ophthalmology

## 2021-09-30 ENCOUNTER — Encounter (HOSPITAL_COMMUNITY): Payer: Self-pay | Admitting: Ophthalmology

## 2021-09-30 ENCOUNTER — Ambulatory Visit (HOSPITAL_COMMUNITY): Payer: Medicare Other | Admitting: Certified Registered Nurse Anesthetist

## 2021-09-30 DIAGNOSIS — H401121 Primary open-angle glaucoma, left eye, mild stage: Secondary | ICD-10-CM | POA: Insufficient documentation

## 2021-09-30 DIAGNOSIS — I10 Essential (primary) hypertension: Secondary | ICD-10-CM | POA: Diagnosis not present

## 2021-09-30 DIAGNOSIS — Z87891 Personal history of nicotine dependence: Secondary | ICD-10-CM | POA: Diagnosis not present

## 2021-09-30 DIAGNOSIS — I2510 Atherosclerotic heart disease of native coronary artery without angina pectoris: Secondary | ICD-10-CM

## 2021-09-30 DIAGNOSIS — I129 Hypertensive chronic kidney disease with stage 1 through stage 4 chronic kidney disease, or unspecified chronic kidney disease: Secondary | ICD-10-CM

## 2021-09-30 DIAGNOSIS — J449 Chronic obstructive pulmonary disease, unspecified: Secondary | ICD-10-CM | POA: Insufficient documentation

## 2021-09-30 DIAGNOSIS — H25812 Combined forms of age-related cataract, left eye: Secondary | ICD-10-CM | POA: Diagnosis not present

## 2021-09-30 DIAGNOSIS — Z9841 Cataract extraction status, right eye: Secondary | ICD-10-CM | POA: Diagnosis not present

## 2021-09-30 DIAGNOSIS — I252 Old myocardial infarction: Secondary | ICD-10-CM | POA: Insufficient documentation

## 2021-09-30 DIAGNOSIS — H2512 Age-related nuclear cataract, left eye: Secondary | ICD-10-CM | POA: Insufficient documentation

## 2021-09-30 DIAGNOSIS — N1832 Chronic kidney disease, stage 3b: Secondary | ICD-10-CM

## 2021-09-30 HISTORY — PX: CATARACT EXTRACTION W/PHACO: SHX586

## 2021-09-30 SURGERY — PHACOEMULSIFICATION, CATARACT, WITH IOL INSERTION
Anesthesia: Monitor Anesthesia Care | Site: Eye | Laterality: Left

## 2021-09-30 MED ORDER — STERILE WATER FOR IRRIGATION IR SOLN
Status: DC | PRN
  Administered 2021-09-30: 250 mL

## 2021-09-30 MED ORDER — EPINEPHRINE PF 1 MG/ML IJ SOLN
INTRAMUSCULAR | Status: AC
  Filled 2021-09-30: qty 2

## 2021-09-30 MED ORDER — LIDOCAINE HCL 3.5 % OP GEL
1.0000 "application " | Freq: Once | OPHTHALMIC | Status: AC
  Administered 2021-09-30: 1 via OPHTHALMIC

## 2021-09-30 MED ORDER — PHENYLEPHRINE HCL 2.5 % OP SOLN
1.0000 [drp] | OPHTHALMIC | Status: AC | PRN
  Administered 2021-09-30 (×3): 1 [drp] via OPHTHALMIC

## 2021-09-30 MED ORDER — BSS IO SOLN
INTRAOCULAR | Status: DC | PRN
  Administered 2021-09-30: 15 mL via INTRAOCULAR

## 2021-09-30 MED ORDER — EPINEPHRINE PF 1 MG/ML IJ SOLN
INTRAOCULAR | Status: DC | PRN
  Administered 2021-09-30: 1 mL via OPHTHALMIC

## 2021-09-30 MED ORDER — TRYPAN BLUE 0.06 % IO SOSY
PREFILLED_SYRINGE | INTRAOCULAR | Status: AC
  Filled 2021-09-30: qty 0.5

## 2021-09-30 MED ORDER — TROPICAMIDE 1 % OP SOLN
1.0000 [drp] | OPHTHALMIC | Status: AC | PRN
  Administered 2021-09-30 (×3): 1 [drp] via OPHTHALMIC

## 2021-09-30 MED ORDER — SODIUM HYALURONATE 23MG/ML IO SOSY
PREFILLED_SYRINGE | INTRAOCULAR | Status: DC | PRN
  Administered 2021-09-30: 0.6 mL via INTRAOCULAR

## 2021-09-30 MED ORDER — TETRACAINE HCL 0.5 % OP SOLN
1.0000 [drp] | OPHTHALMIC | Status: AC | PRN
  Administered 2021-09-30 (×3): 1 [drp] via OPHTHALMIC

## 2021-09-30 MED ORDER — EPINEPHRINE PF 1 MG/ML IJ SOLN
INTRAOCULAR | Status: DC | PRN
  Administered 2021-09-30: 500 mL

## 2021-09-30 MED ORDER — SODIUM HYALURONATE 10 MG/ML IO SOLUTION
PREFILLED_SYRINGE | INTRAOCULAR | Status: DC | PRN
  Administered 2021-09-30: 0.85 mL via INTRAOCULAR

## 2021-09-30 MED ORDER — NEOMYCIN-POLYMYXIN-DEXAMETH 3.5-10000-0.1 OP SUSP
OPHTHALMIC | Status: DC | PRN
  Administered 2021-09-30: 1 [drp] via OPHTHALMIC

## 2021-09-30 MED ORDER — POVIDONE-IODINE 5 % OP SOLN
OPHTHALMIC | Status: DC | PRN
Start: 2021-09-30 — End: 2021-09-30
  Administered 2021-09-30: 1 via OPHTHALMIC

## 2021-09-30 SURGICAL SUPPLY — 16 items
CATARACT SUITE SIGHTPATH (MISCELLANEOUS) ×2 IMPLANT
CLIP IPRISM (KITS) ×1 IMPLANT
CLOTH BEACON ORANGE TIMEOUT ST (SAFETY) ×2 IMPLANT
EYE SHIELD UNIVERSAL CLEAR (GAUZE/BANDAGES/DRESSINGS) ×1 IMPLANT
FEE CATARACT SUITE SIGHTPATH (MISCELLANEOUS) ×1 IMPLANT
GLOVE BIOGEL PI IND STRL 7.0 (GLOVE) ×2 IMPLANT
GLOVE BIOGEL PI INDICATOR 7.0 (GLOVE) ×2
LENS GONIO REUSABLE (MISCELLANEOUS) IMPLANT
LENS IOL RAYNER 22.5 (Intraocular Lens) ×2 IMPLANT
LENS IOL RAYONE EMV 22.5 (Intraocular Lens) IMPLANT
PAD ARMBOARD 7.5X6 YLW CONV (MISCELLANEOUS) ×2 IMPLANT
SYR TB 1ML LL NO SAFETY (SYRINGE) ×2 IMPLANT
TAPE SURG TRANSPORE 1 IN (GAUZE/BANDAGES/DRESSINGS) IMPLANT
TAPE SURGICAL TRANSPORE 1 IN (GAUZE/BANDAGES/DRESSINGS) ×2
TREPHINE GLAUKO IACCESS TRBCLR (OPHTHALMIC) ×1 IMPLANT
WATER STERILE IRR 250ML POUR (IV SOLUTION) ×2 IMPLANT

## 2021-09-30 NOTE — Anesthesia Postprocedure Evaluation (Signed)
Anesthesia Post Note ? ?Patient: Kyle Patel. ? ?Procedure(s) Performed: CATARACT EXTRACTION PHACO AND INTRAOCULAR LENS PLACEMENT (IOC) (Left: Eye) ?Goniotomy (Left: Eye) ? ?Patient location during evaluation: Phase II ?Anesthesia Type: MAC ?Level of consciousness: awake ?Pain management: pain level controlled ?Vital Signs Assessment: post-procedure vital signs reviewed and stable ?Respiratory status: spontaneous breathing and respiratory function stable ?Cardiovascular status: blood pressure returned to baseline and stable ?Postop Assessment: no headache and no apparent nausea or vomiting ?Anesthetic complications: no ?Comments: Late entry ? ? ?No notable events documented. ? ? ?Last Vitals:  ?Vitals:  ? 09/30/21 0924 09/30/21 1000  ?BP: (!) 136/58 (!) 153/70  ?Pulse: (!) 48 (!) 50  ?Resp: 18 16  ?Temp: 36.6 ?C 36.7 ?C  ?SpO2: 94% 99%  ?  ?Last Pain:  ?Vitals:  ? 09/30/21 1000  ?TempSrc: Oral  ?PainSc: 0-No pain  ? ? ?  ?  ?  ?  ?  ?  ? ?Louann Sjogren ? ? ? ? ?

## 2021-09-30 NOTE — Interval H&P Note (Signed)
History and Physical Interval Note: ? ?09/30/2021 ?9:28 AM ? ?Kyle Patel.  has presented today for surgery, with the diagnosis of combined forms age related cataract; left ?glaucoma; left.  The various methods of treatment have been discussed with the patient and family. After consideration of risks, benefits and other options for treatment, the patient has consented to  Procedure(s): ?CATARACT EXTRACTION PHACO AND INTRAOCULAR LENS PLACEMENT (IOC) (Left) ?Goniotomy (Left) as a surgical intervention.  The patient's history has been reviewed, patient examined, no change in status, stable for surgery.  I have reviewed the patient's chart and labs.  Questions were answered to the patient's satisfaction.   ? ? ?Baruch Goldmann ? ? ?

## 2021-09-30 NOTE — Anesthesia Preprocedure Evaluation (Signed)
Anesthesia Evaluation  ?Patient identified by MRN, date of birth, ID band ?Patient awake ? ? ? ?Reviewed: ?Allergy & Precautions, H&P , NPO status , Patient's Chart, lab work & pertinent test results, reviewed documented beta blocker date and time  ? ?Airway ?Mallampati: II ? ?TM Distance: >3 FB ?Neck ROM: full ? ? ? Dental ?no notable dental hx. ? ?  ?Pulmonary ?COPD, former smoker,  ?  ?Pulmonary exam normal ?breath sounds clear to auscultation ? ? ? ? ? ? Cardiovascular ?Exercise Tolerance: Good ?hypertension, + CAD and + Past MI  ? ?Rhythm:regular Rate:Normal ? ? ?  ?Neuro/Psych ? Neuromuscular disease negative psych ROS  ? GI/Hepatic ?negative GI ROS, Neg liver ROS,   ?Endo/Other  ?negative endocrine ROS ? 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  ?Musculoskeletal ? ? Abdominal ?  ?Peds ? Hematology ?negative hematology ROS ?(+)   ?Anesthesia Other Findings ? ? Reproductive/Obstetrics ?negative OB ROS ? ?  ? ? ? ? ? ? ? ? ? ? ? ? ? ?  ?  ? ? ? ? ? ? ? ? ?Anesthesia Physical ?Anesthesia Plan ? ?ASA: 3 ? ?Anesthesia Plan: MAC  ? ?Post-op Pain Management:   ? ?Induction:  ? ?PONV Risk Score and Plan:  ? ?Airway Management Planned:  ? ?Additional Equipment:  ? ?Intra-op Plan:  ? ?Post-operative Plan:  ? ?Informed Consent: I have reviewed the patients History and Physical, chart, labs and discussed the procedure including the risks, benefits and alternatives for the proposed anesthesia with the patient or authorized representative who has indicated his/her understanding and acceptance.  ? ? ? ?Dental Advisory Given ? ?Plan Discussed with: CRNA ? ?Anesthesia Plan Comments:   ? ? ? ? ? ? ?Anesthesia Quick Evaluation ? ?

## 2021-09-30 NOTE — Discharge Instructions (Addendum)
Please discharge patient when stable, will follow up today with Dr. Wrzosek at the [facility] office immediately following discharge.  Leave shield in place until visit.  All paperwork with discharge instructions will be given at the office.  [facility] Address:  [address]  [place], [place] [address]  

## 2021-09-30 NOTE — Transfer of Care (Signed)
Immediate Anesthesia Transfer of Care Note ? ?Patient: Kyle Patel. ? ?Procedure(s) Performed: CATARACT EXTRACTION PHACO AND INTRAOCULAR LENS PLACEMENT (IOC) (Left: Eye) ?Goniotomy (Left: Eye) ? ?Patient Location: PACU ? ?Anesthesia Type:MAC ? ?Level of Consciousness: awake, alert  and oriented ? ?Airway & Oxygen Therapy: Patient Spontanous Breathing ? ?Post-op Assessment: Report given to RN and Post -op Vital signs reviewed and stable ? ?Post vital signs: Reviewed and stable ? ?Last Vitals:  ?Vitals Value Taken Time  ?BP 153/70   ?Temp    ?Pulse 48   ?Resp 16   ?SpO2 97%   ? ? ?Last Pain:  ?Vitals:  ? 09/30/21 0924  ?TempSrc: Oral  ?PainSc: 0-No pain  ?   ? ?Patients Stated Pain Goal: 0 (09/30/21 9169) ? ?Complications: No notable events documented. ?

## 2021-09-30 NOTE — Op Note (Signed)
Date of procedure: 09/30/21 ? ?Pre-operative diagnosis:  ?1) Visually significant age-related combined cataract, Left Eye (H25.812) ?2) Primary Open Angle Glaucoma, mild Stage, Left Eye ? ?Post-operative diagnosis: ?1) Visually significant age-related cataract, Left Eye ?2) Primary Open Angle Glaucoma, mild Stage, Left Eye ? ?Procedure:  ?1) Removal of cataract via phacoemulsification and insertion of intra-ocular lens Rayner [id number] +22.5D into the capsular bag of the Left Eye ?2) Goniotomy treatment of nasal trabecular meshwork with iAccess, Left Eye ? ?Attending surgeon: Gerda Diss. Marisa Hua, MD, MA ? ?Anesthesia: MAC, Topical [place] ? ?Complications: None ? ?Estimated Blood Loss: <11m (minimal) ? ?Specimens: None ? ?Implants: As above ? ?Indications:  Visually significant age-related cataract, Left Eye; Glaucoma, Left Eye ? ?Procedure:  ?The patient was seen and identified in the pre-operative area. The operative eye was identified and dilated.  The operative eye was marked.  Topical anesthesia was administered to the operative eye.    ? ?The patient was then to the operative suite and placed in the supine position.  A timeout was performed confirming the patient, procedure to be performed, and all other relevant information.   The patient's face was prepped and draped in the usual fashion for intra-ocular surgery.  A lid speculum was placed into the operative eye and the surgical microscope moved into place and focused. An inferotemporal paracentesis was created using a 20 gauge paracentesis blade.  Shugarcaine was injected into the anterior chamber.  Viscoelastic was injected into the anterior chamber.  A temporal clear-corneal main wound incision was created using a 2.469mmicrokeratome.  A continuous curvilinear capsulorrhexis was initiated using an irrigating cystitome and completed using capsulorrhexis forceps.  Hydrodissection and hydrodeliniation were performed.  Viscoelastic was injected into the anterior  chamber.  A phacoemulsification handpiece and a chopper as a second instrument were used to remove the nucleus and epinucleus. The irrigation/aspiration handpiece was used to remove any remaining cortical material.  ? ?The capsular bag was reinflated with viscoelastic, checked, and found to be intact.  The intraocular lens was inserted into the capsular bag and dialed into place using a Kuglen hook.   ? ?The patient's head was repositioned.  The iAccess was used to extensively treat the nasal trabecular meshwork for 90 degrees for a total of 5 goniotomies under gonioscopy. ? ?The patient's head was returned to neutral.  The irrigation/aspiration handpiece was used to remove any remaining viscoelastic.  The clear corneal wound and paracentesis wounds were then hydrated and checked with Weck-Cels to be watertight.  The lid-speculum was removed.  The drape was removed.  The patient's face was cleaned with a wet and dry 4x4.  Maxitrol was instilled in the eye before a clear shield was taped over the eye. The patient was taken to the post-operative care unit in good condition, having tolerated the procedure well. ? ?Post-Op Instructions: [facility] and will receive all other orders and instructions. ? ?

## 2021-10-01 ENCOUNTER — Encounter (HOSPITAL_COMMUNITY): Payer: Self-pay | Admitting: Ophthalmology

## 2021-10-07 ENCOUNTER — Other Ambulatory Visit: Payer: Self-pay | Admitting: Student

## 2021-10-22 ENCOUNTER — Ambulatory Visit: Payer: Medicare Other | Admitting: Physician Assistant

## 2021-10-22 ENCOUNTER — Encounter: Payer: Self-pay | Admitting: Physician Assistant

## 2021-10-22 ENCOUNTER — Encounter (INDEPENDENT_AMBULATORY_CARE_PROVIDER_SITE_OTHER): Payer: Self-pay

## 2021-10-22 VITALS — BP 148/78 | HR 59 | Ht 69.5 in | Wt 207.0 lb

## 2021-10-22 DIAGNOSIS — R5383 Other fatigue: Secondary | ICD-10-CM

## 2021-10-22 DIAGNOSIS — I2510 Atherosclerotic heart disease of native coronary artery without angina pectoris: Secondary | ICD-10-CM | POA: Diagnosis not present

## 2021-10-22 DIAGNOSIS — E785 Hyperlipidemia, unspecified: Secondary | ICD-10-CM

## 2021-10-22 DIAGNOSIS — I10 Essential (primary) hypertension: Secondary | ICD-10-CM

## 2021-10-22 DIAGNOSIS — R0609 Other forms of dyspnea: Secondary | ICD-10-CM

## 2021-10-22 NOTE — Patient Instructions (Addendum)
Medication Instructions:  Your physician has recommended you make the following change in your medication:   Increase Amlodipine to 10 mg Daily. You may take 2 of the 5 mg tablets until gone.   Take your Blood Pressure Daily   Follow up with PCP regarding referral to pain clinic.   *If you need a refill on your cardiac medications before your next appointment, please call your pharmacy*   Lab Work: Have labs done with your PCP. If not let us know and we will place the orders.   If you have labs (blood work) drawn today and your tests are completely normal, you will receive your results only by: [place] (if you have MyChart) OR A paper copy in the mail If you have any lab test that is abnormal or we need to change your treatment, we will call you to review the results.   Testing/Procedures: NONE   Follow-Up: At [facility], you and your health needs are our priority.  As part of our continuing mission to provide you with exceptional heart care, we have created designated Provider Care Teams.  These Care Teams include your primary Cardiologist (physician) and Advanced Practice Providers (APPs -  Physician Assistants and Nurse Practitioners) who all work together to provide you with the care you need, when you need it.  We recommend signing up for the patient portal called "MyChart".  Sign up information is provided on this After Visit Summary.  MyChart is used to connect with patients for Virtual Visits (Telemedicine).  Patients are able to view lab/test results, encounter notes, upcoming appointments, etc.  Non-urgent messages can be sent to your provider as well.   To learn more about what you can do with MyChart, go to [email or web address].    Your next appointment:   1 year(s)  The format for your next appointment:   In Person  Provider:   Rozann Lesches, MD    Other Instructions Thank you for choosing [facility]!    Important Information About  [place]

## 2021-10-22 NOTE — Progress Notes (Signed)
Cardiology Office Note   Date:  10/22/2021   ID:  Kyle Gibler., DOB 09/05/1945, MRN [id number]  PCP:  Kyle Spar, MD Cardiologist:  Kyle Lesches, MD 06/30/2020 Kyle Patel, [facility] 03/22/2021 tele visit Electrphysiologist: None Kyle Hughson, PA-C   No chief complaint on file.   History of Present Illness: Kyle Caldron. is a 76 y.o. 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 Cataract surgeries 03/27 and 05/08  Kyle Patel. 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  He struggles with chronic pain issues, he has multiple joint pains, but the worst ones are in his lower legs and feet.  He tries to do a little something around the house, but admits that he is extremely inactive, because of the pain.  This has been going on for more than a year.  He recently had cataract surgery and says it worked Oceanographer on his vision.  He has not had chest pain with exertion.  However, he does feel like he gets short of breath more easily than he used to.  He does feel tired all the time, this is not recent and no significant change.   Past Medical History:  Diagnosis Date   Acute anterior wall MI (Kyle Patel) 04/24/2017   Arthritis    AVN of femur (Kyle Patel)    CAD (coronary artery disease)    a. 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   Hyperlipidemia    Pulmonary embolism (Kyle Patel) 10/2011    Past Surgical  History:  Procedure Laterality Date   ABDOMINAL SURGERY     colon polyp removed 2007- sepsis, Patel for 4 months per pt   BIOPSY  12/24/2020   Procedure: BIOPSY;  Surgeon: Kyle Harman, DO;  [facility];  Service: Endoscopy;;   CATARACT EXTRACTION W/PHACO Right 08/19/2021   Procedure: CATARACT EXTRACTION PHACO AND INTRAOCULAR LENS PLACEMENT ([place]);  Surgeon: Kyle Goldmann, MD;  Location: [facility];  Service: Ophthalmology;  Laterality: Right;  CDE: 7.02    CATARACT EXTRACTION W/PHACO Left 09/30/2021   Procedure: CATARACT EXTRACTION PHACO AND INTRAOCULAR LENS PLACEMENT ([facility]);  Surgeon: Kyle Goldmann, MD;  Location: [facility];  Service: Ophthalmology;  Laterality: Left;  CDE 4.61   COLONOSCOPY  03/22/2010   RMR: 1. Normal rectum 2. Diminutive polyp at the mouth of the ileocecal valve  status post cold biopsy removal. Remaideer of the colonic mucosa and terminal ileum mucosa appeared normal.    COLONOSCOPY N/A 06/25/2015   Procedure: COLONOSCOPY;  Surgeon: Kyle Dolin, MD;  [facility];  Service: Endoscopy;  Laterality: N/A;  200-rescheduled 1/30 per Kyle Patel    COLONOSCOPY WITH PROPOFOL N/A 12/24/2020   Procedure: COLONOSCOPY WITH PROPOFOL;  Surgeon: Kyle Harman, DO;  [facility];  Service: Endoscopy;  Laterality: N/A;  ASA III / 8:00   CORONARY BALLOON  ANGIOPLASTY N/A 04/24/2017   Procedure: CORONARY BALLOON ANGIOPLASTY;  Surgeon: Kyle Booze, MD;  Location: [place] [facility];  Service: Cardiovascular;  Laterality: N/A;   LEFT HEART CATH AND CORONARY ANGIOGRAPHY N/A 04/24/2017   Procedure: LEFT HEART CATH AND CORONARY ANGIOGRAPHY;  Surgeon: Kyle Booze, MD;  Location: [place] [facility];  Service: Cardiovascular;  Laterality: N/A;   LUMBAR LAMINECTOMY  06/02/2012   LUMBAR LAMINECTOMY/DECOMPRESSION MICRODISCECTOMY  06/02/2012   Procedure: LUMBAR LAMINECTOMY/DECOMPRESSION MICRODISCECTOMY 1 LEVEL;  Surgeon: Kyle Stakes, MD;  Location: [facility];  Service: Neurosurgery;  Laterality: Bilateral;  Bilateral Lumbar five-sacral one Foraminotomy and microdiskectomy    POLYPECTOMY  12/24/2020   Procedure: POLYPECTOMY;  Surgeon: Kyle Harman, DO;  [facility];  Service: Endoscopy;;    Current Outpatient Medications  Medication Sig Dispense Refill   amLODipine (NORVASC) 5 MG tablet Take 1 tablet (5 mg total) by mouth daily. 90 tablet 3   aspirin 81 MG EC tablet Take 81 mg by mouth daily.     calcitRIOL (ROCALTROL) 0.25 MCG capsule Take 0.25 mcg by mouth every Monday, Wednesday, and Friday.     Coenzyme Q10-Vitamin E 100-150 MG-UNIT CAPS Take 1 capsule by mouth at bedtime.     ezetimibe (ZETIA) 10 MG tablet TAKE (1) TABLET BY MOUTH ONCE DAILY. 90 tablet 3   FARXIGA 5 MG TABS tablet Take 5 mg by mouth every morning.     furosemide (LASIX) 20 MG tablet Take 20 mg by mouth daily.     lisinopril (ZESTRIL) 40 MG tablet TAKE (1) TABLET BY MOUTH ONCE DAILY. 90 tablet 1   metoprolol succinate (TOPROL-XL) 50 MG 24 hr tablet Take 50 mg by mouth daily.     Misc Natural Products (OSTEO BI-FLEX ADV JOINT SHIELD) TABS Take 1 tablet by mouth daily.     Multiple Vitamins-Minerals (CENTRUM PO) Take 1 tablet by mouth every morning.     nitroGLYCERIN (NITROSTAT) 0.4 MG SL tablet PLACE 1 TAB UNDER TONGUE EVERY 5 MIN IF NEEDED FOR CHEST PAIN. MAY USE 3 TIMES.NO RELIEF CALL 911. (Patient taking differently: Place 0.4 mg under the tongue every 5 (five) minutes as needed for chest pain. PLACE 1 TAB UNDER TONGUE EVERY 5 MIN IF NEEDED FOR CHEST PAIN. MAY USE 3 TIMES.NO RELIEF CALL 911.) 25 tablet 3   pregabalin (LYRICA) 150 MG capsule Take one daily at bedtime (Patient taking differently: Take 150 mg by mouth 2 (two) times daily.) 30 capsule 3   rosuvastatin (CRESTOR) 10 MG tablet TAKE (1) TABLET BY MOUTH ONCE DAILY. 90 tablet 0   sodium bicarbonate 650 MG tablet Take 650 mg by mouth 3 (three) times daily.     No current facility-administered  medications for this visit.    Allergies:   Levaquin [levofloxacin] and Statins    Social History:  The patient  reports that he quit smoking about 22 years ago. His smoking use included cigarettes. He has a 2.00 pack-year smoking history. He has never used smokeless tobacco. He reports that he does not drink alcohol and does not use drugs.   Family History:  The patient's family history includes Arthritis in an other family member; Colon cancer (age of onset: 53) in his sister; Diabetes in his mother; Heart attack in his father and mother; Stroke in his father.  He indicated that his mother is deceased. He indicated that his father is deceased. He indicated that three of his four sisters are alive. He indicated  that his brother is deceased. He indicated that his maternal grandmother is deceased. He indicated that his maternal grandfather is deceased. He indicated that his paternal grandmother is deceased. He indicated that his paternal grandfather is deceased. He indicated that the status of his neg hx is unknown. He indicated that the status of his other is unknown.    ROS:  Please see the history of present illness. All other systems are reviewed and negative.    PHYSICAL EXAM: VS:  BP (!) 148/78   Pulse (!) 59   Ht 5' 9.5" (1.765 m)   Wt 207 lb (93.9 kg)   SpO2 93%   BMI 30.13 kg/m  , BMI Body mass index is 30.13 kg/m. GEN: Well nourished, well developed, male in no acute distress HEENT: normal for age  Neck: no JVD, no carotid bruit, no masses Cardiac: RRR; no murmur, no rubs, or gallops Respiratory:  clear to auscultation bilaterally, normal work of breathing GI: soft, nontender, nondistended, + BS MS: no deformity or atrophy; no edema; distal pulses are 2+ in all 4 extremities  Skin: warm and dry, no rash Neuro:  Strength and sensation are intact Psych: euthymic mood, full affect   EKG:  EKG is ordered today. The ekg ordered today demonstrates sinus brady HR 59, RBBB &  LAFB are old  ECHO: 01/23/2020  1. Left ventricular ejection fraction, by estimation, is 60 to 65%. The  left ventricle has normal function. The left ventricle has no regional  wall motion abnormalities. Left ventricular diastolic parameters are  indeterminate.   2. Right ventricular systolic function is normal. The right ventricular  size is normal.   3. The mitral valve is normal in structure. Trivial mitral valve  regurgitation. No evidence of mitral stenosis.   4. The aortic valve has an indeterminant number of cusps. Aortic valve  regurgitation is not visualized. No aortic stenosis is present.   5. The inferior vena cava is normal in size with greater than 50%  respiratory variability, suggesting right atrial pressure of 3 mmHg.    CATH: 04/24/2017 Dist LAD lesion is 100% stenosed. Wire dissection at the lesion due to severe tortuosity. Initial TIMI 0 flow improved to TIMI2 flow after balloon inflations. Chest pain improved at the end of the case. I don't think stent could be delivered. Mid RCA lesion is 10% stenosed. LV end diastolic pressure is normal. There is no aortic valve stenosis. Balloon angioplasty was performed using a BALLOON MAVERICK OTW 2.0X15. Post intervention, there is a 90% residual stenosis.   Medical therapy going forward including DAPT for 12 months along with aggressive secondary prevention.  LVEF preserved.  Small apical wall motion abnormality by echo.    Intervention   MOVIEW: 08/03/2020 No diagnostic ST segment changes to indicate ischemia. Small, moderate intensity, apical and inferior apical defect that is fixed and most consistent with scar. There is also diaphragmatic attenuation noted. This is a low risk study. Nuclear stress EF: 58%.   Recent Labs: 12/20/2020: BUN 27; Creatinine, Ser 1.89; Potassium 4.6; Sodium 140  CBC    Component Value Date/Time   WBC 9.0 08/08/2019 1238   RBC 4.34 08/08/2019 1238   HGB 13.4 08/08/2019 1238   HCT 40.3  08/08/2019 1238   PLT 198 08/08/2019 1238   MCV 92.9 08/08/2019 1238   MCH 30.9 08/08/2019 1238   MCHC 33.3 08/08/2019 1238   RDW 12.5 08/08/2019 1238   LYMPHSABS 2,250 08/08/2019 1238   MONOABS 1.7 (H) 11/10/2011 2024  EOSABS 135 08/08/2019 1238   BASOSABS 27 08/08/2019 1238      Latest Ref Rng & Units 12/20/2020    3:27 PM 08/08/2019   12:38 PM 05/14/2018    8:33 AM  CMP  Glucose 70 - 99 mg/dL 133   74   102    BUN 8 - 23 mg/dL '27   23   20    '$ Creatinine 0.61 - 1.24 mg/dL 1.89   1.77   1.45    Sodium 135 - 145 mmol/L 140   141   137    Potassium 3.5 - 5.1 mmol/L 4.6   5.3   4.2    Chloride 98 - 111 mmol/L 113   113   108    CO2 22 - 32 mmol/L '21   21   23    '$ Calcium 8.9 - 10.3 mg/dL 8.5   9.8   8.9    Total Protein 6.1 - 8.1 g/dL  7.3   7.6    Total Bilirubin 0.2 - 1.2 mg/dL  0.4   0.6    Alkaline Phos 38 - 126 U/L   56    AST 10 - 35 U/L  26   34    ALT 9 - 46 U/L  18   24       Lipid Panel Lab Results  Component Value Date   CHOL 238 (H) 07/04/2019   HDL 53 07/04/2019   LDLCALC 163 (H) 07/04/2019   TRIG 124 07/04/2019   CHOLHDL 4.5 07/04/2019      Wt Readings from Last 3 Encounters:  10/22/21 207 lb (93.9 kg)  09/24/21 204 lb 12.9 oz (92.9 kg)  09/02/21 204 lb 12.8 oz (92.9 kg)     Other studies Reviewed: Additional studies/ records that were reviewed today include: Office notes, hospital records and testing.  ASSESSMENT AND PLAN:  1.  Fatigue, DOE: - I explained that it is important for his heart for him to stay on the beta-blocker.  Other medications can also cause fatigue. -Because of his musculoskeletal pain, he is extremely limited in what he can do and has been this way for a long time.  I am concerned that deconditioning is playing a role. -He is encouraged to do everything that he can -I suggested he use Silver sneakers to join a gym that has water aerobics or recumbent bikes so that he can do exercise that does not cause his joint pain to be  worse. -He may also benefit from referral to pain clinic, he should discuss this with his PCP  2.  CAD: -His MI was in 2018, but he had a Myoview in 2022 that showed scar and some diaphragmatic attenuation, but no ischemia. -His EF is normal - I Do not feel that he is having any truly ischemic symptoms -If his dyspnea on exertion worsens, consider cardiac cath  3.  Hypertension: - He is compliant with Toprol-XL 50 mg daily but he has resting bradycardia, no dose increase - He is also on lisinopril 40 mg daily, maximum dose -He is on Lasix 20 mg daily, but his creatinine was 1.89 at last labs so no dose change.  He does not have volume overload on exam - He is on amlodipine 5 mg daily, increase this to 10 mg daily - He is encouraged to take his blood pressure once a day, and bring the records to office appointments.  4.  Hyperlipidemia: -10/2020 lipid profile showed a total cholesterol 183,  triglycerides 91, HDL 61, LDL 105 -He has had problems tolerating statins, and is on Zetia 10 mg daily and rosuvastatin 10 mg daily -Discuss with MD referring him to the lipid clinic for [id number]    Current medicines are reviewed at length with the patient today.  The patient does not have concerns regarding medicines.  The following changes have been made: Increase amlodipine from 5 mg to 10 mg daily  Labs/ tests ordered today include:   Orders Placed This Encounter  Procedures   EKG 12-Lead     Disposition:   FU with Kyle Lesches, MD  Signed, Rosaria Ferries, PA-C  10/22/2021 3:33 PM    [place] Phone: [phone]; Fax: [phone]

## 2021-10-29 ENCOUNTER — Ambulatory Visit (INDEPENDENT_AMBULATORY_CARE_PROVIDER_SITE_OTHER): Payer: Medicare Other | Admitting: *Deleted

## 2021-10-29 DIAGNOSIS — Z0000 Encounter for general adult medical examination without abnormal findings: Secondary | ICD-10-CM

## 2021-10-29 DIAGNOSIS — Z1159 Encounter for screening for other viral diseases: Secondary | ICD-10-CM

## 2021-10-29 NOTE — Progress Notes (Signed)
Subjective:   Kyle Patel. is a 77 y.o. male who presents for an Initial Medicare Annual Wellness Visit.  I connected with  Kyle Patel. on 10/29/21 by a audio enabled telemedicine application and verified that I am speaking with the correct person using two identifiers.  Patient Location: Home  Provider Location: Home Office  I discussed the limitations of evaluation and management by telemedicine. The patient expressed understanding and agreed to proceed.   Review of Systems     Kyle Patel , Thank you for taking time to come for your Medicare Wellness Visit. I appreciate your ongoing commitment to your health goals. Please review the following plan we discussed and let me know if I can assist you in the future.   These are the goals we discussed:  Goals   None     This is a list of the screening recommended for you and due dates:  Health Maintenance  Topic Date Due   Hepatitis C Screening: USPSTF Recommendation to screen - Ages 59-79 yo.  Never done   Tetanus Vaccine  Never done   Zoster (Shingles) Vaccine (1 of 2) Never done   COVID-19 Vaccine (3 - Moderna risk series) 09/07/2019   Flu Shot  12/24/2021   Colon Cancer Screening  12/25/2030   Pneumonia Vaccine  Completed   HPV Vaccine  Aged Out          Objective:    There were no vitals filed for this visit. There is no height or weight on file to calculate BMI.     09/30/2021    8:50 AM 09/24/2021    9:10 AM 08/19/2021   12:55 PM 08/05/2021   12:27 PM 07/30/2021   10:15 AM 12/24/2020    6:40 AM 12/20/2020    3:27 PM  Advanced Directives  Does Patient Have a Medical Advance Directive? No No No No No No No  Would patient like information on creating a medical advance directive?  No - Patient declined No - Patient declined No - Patient declined No - Patient declined No - Patient declined No - Patient declined    Current Medications (verified) Outpatient Encounter Medications as of 10/29/2021  Medication Sig    amLODipine (NORVASC) 5 MG tablet Take 1 tablet (5 mg total) by mouth daily.   aspirin 81 MG EC tablet Take 81 mg by mouth daily.   calcitRIOL (ROCALTROL) 0.25 MCG capsule Take 0.25 mcg by mouth every Monday, Wednesday, and Friday.   Coenzyme Q10-Vitamin E 100-150 MG-UNIT CAPS Take 1 capsule by mouth at bedtime.   ezetimibe (ZETIA) 10 MG tablet TAKE (1) TABLET BY MOUTH ONCE DAILY.   FARXIGA 5 MG TABS tablet Take 5 mg by mouth every morning.   furosemide (LASIX) 20 MG tablet Take 20 mg by mouth daily.   lisinopril (ZESTRIL) 40 MG tablet TAKE (1) TABLET BY MOUTH ONCE DAILY.   metoprolol succinate (TOPROL-XL) 50 MG 24 hr tablet Take 50 mg by mouth daily.   Misc Natural Products (OSTEO BI-FLEX ADV JOINT SHIELD) TABS Take 1 tablet by mouth daily.   Multiple Vitamins-Minerals (CENTRUM PO) Take 1 tablet by mouth every morning.   nitroGLYCERIN (NITROSTAT) 0.4 MG SL tablet PLACE 1 TAB UNDER TONGUE EVERY 5 MIN IF NEEDED FOR CHEST PAIN. MAY USE 3 TIMES.NO RELIEF CALL 911. (Patient taking differently: Place 0.4 mg under the tongue every 5 (five) minutes as needed for chest pain. PLACE 1 TAB UNDER TONGUE EVERY 5 MIN IF  NEEDED FOR CHEST PAIN. MAY USE 3 TIMES.NO RELIEF CALL 911.)   pregabalin (LYRICA) 150 MG capsule Take one daily at bedtime (Patient taking differently: Take 150 mg by mouth 2 (two) times daily.)   rosuvastatin (CRESTOR) 10 MG tablet TAKE (1) TABLET BY MOUTH ONCE DAILY.   sodium bicarbonate 650 MG tablet Take 650 mg by mouth 3 (three) times daily.   No facility-administered encounter medications on file as of 10/29/2021.    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   Hyperlipidemia    Pulmonary embolism ([place]) 10/2011   Past Surgical History:  Procedure Laterality Date   ABDOMINAL SURGERY     colon polyp removed 2007- sepsis, coma for 4 months per pt   BIOPSY  12/24/2020   Procedure: BIOPSY;  Surgeon: Eloise Harman, DO;  [facility];  Service: Endoscopy;;   CATARACT EXTRACTION W/PHACO Right 08/19/2021   Procedure: CATARACT EXTRACTION PHACO AND INTRAOCULAR LENS PLACEMENT ([place]);  Surgeon: Baruch Goldmann, MD;  Location: [facility];  Service: Ophthalmology;  Laterality: Right;  CDE: 7.02    CATARACT EXTRACTION W/PHACO Left 09/30/2021   Procedure: CATARACT EXTRACTION PHACO AND INTRAOCULAR LENS PLACEMENT ([facility]);  Surgeon: Baruch Goldmann, MD;  Location: [facility];  Service: Ophthalmology;  Laterality: Left;  CDE 4.61   COLONOSCOPY  03/22/2010   RMR: 1. Normal rectum 2. Diminutive polyp at the mouth of the ileocecal valve  status post cold biopsy removal. Remaideer of the colonic mucosa and terminal ileum mucosa appeared normal.    COLONOSCOPY N/A 06/25/2015   Procedure: COLONOSCOPY;  Surgeon: Daneil Dolin, MD;  [facility];  Service: Endoscopy;  Laterality: N/A;  200-rescheduled 1/30 per [place]    COLONOSCOPY WITH PROPOFOL N/A 12/24/2020   Procedure: COLONOSCOPY WITH PROPOFOL;  Surgeon: Eloise Harman, DO;  [facility];  Service: Endoscopy;  Laterality: N/A;  ASA III / 8:00   CORONARY BALLOON ANGIOPLASTY N/A 04/24/2017   Procedure: CORONARY BALLOON ANGIOPLASTY;  Surgeon: Jettie Booze, MD;  Location: [place] [facility];  Service: Cardiovascular;  Laterality: N/A;   LEFT HEART CATH AND CORONARY ANGIOGRAPHY N/A 04/24/2017   Procedure: LEFT HEART CATH AND CORONARY ANGIOGRAPHY;  Surgeon: Jettie Booze, MD;  Location: [place] [facility];  Service: Cardiovascular;  Laterality: N/A;   LUMBAR LAMINECTOMY  06/02/2012   LUMBAR  LAMINECTOMY/DECOMPRESSION MICRODISCECTOMY  06/02/2012   Procedure: LUMBAR LAMINECTOMY/DECOMPRESSION MICRODISCECTOMY 1 LEVEL;  Surgeon: Floyce Stakes, MD;  Location: [facility];  Service: Neurosurgery;  Laterality: Bilateral;  Bilateral Lumbar five-sacral one Foraminotomy and microdiskectomy    POLYPECTOMY  12/24/2020   Procedure: POLYPECTOMY;  Surgeon: Eloise Harman, DO;  [facility];  Service: Endoscopy;;   Family History  Problem Relation Age of Onset   Heart attack Mother    Diabetes Mother    Stroke Father    Heart attack Father    Arthritis Other    Colon cancer Sister 40       Estimated age of onset; just found out about  it in 2015.   Neuropathy Neg Hx    Social History   Socioeconomic History   Marital status: Married    Spouse name: Not on file   Number of children: 1   Years of education: Not on file   Highest education level: Not on file  Occupational History   Occupation: retired  Tobacco Use   Smoking status: Former    Packs/day: 0.10    Years: 20.00    Pack years: 2.00    Types: Cigarettes    Quit date: 05/08/1999    Years since quitting: 22.4   Smokeless tobacco: Never  Vaping Use   Vaping Use: Never used  Substance and Sexual Activity   Alcohol use: No   Drug use: No   Sexual activity: Yes  Other Topics Concern   Not on file  Social History Narrative   Patient is right handed.   Patient drinks caffeine occasionally   Social Determinants of Health   Financial Resource Strain: Not on file  Food Insecurity: Not on file  Transportation Needs: Not on file  Physical Activity: Not on file  Stress: Not on file  Social Connections: Not on file    Tobacco Counseling Counseling given: Not Answered   Clinical Intake:                 Diabetic?no         Activities of Daily Living    09/24/2021    9:10 AM 07/30/2021   10:17 AM  In your present state of health, do you have any difficulty performing the following  activities:  Hearing? 0   Vision? 1   Difficulty concentrating or making decisions? 0   Walking or climbing stairs? 0   Dressing or bathing? 0   Doing errands, shopping?  0    Patient Care Team: Lindell Spar, MD as PCP - General (Internal Medicine) Satira Sark, MD as PCP - Cardiology (Cardiology) Leeroy Cha, MD as Consulting Physician (Neurosurgery) Gala Romney Cristopher Estimable, MD as Consulting Physician (Gastroenterology)  Indicate any recent Medical Services you may have received from other than [facility] providers in the past year (date may be approximate).     Assessment:   This is a routine wellness examination for Kyle Patel.  Hearing/Vision screen No results found.  Dietary issues and exercise activities discussed:     Goals Addressed   None   Depression Screen    09/02/2021    3:25 PM 08/08/2019   11:15 AM 09/07/2017    2:16 PM 06/11/2017    1:57 PM  PHQ 2/9 Scores  PHQ - 2 Score 0 0 0 0  PHQ- 9 Score   6 2    Fall Risk    09/02/2021    3:25 PM 08/08/2019   11:15 AM 06/11/2017    9:47 AM  Fall Risk   Falls in the past year? 0 0 Yes  Number falls in past yr: 0 0 1  Injury with Fall? 0 0 No  Risk for fall due to : No Fall Risks    Risk for fall due to: Comment   No history of falls  Follow up Falls evaluation completed Falls evaluation completed Falls evaluation completed  Comment   No history of falls    FALL RISK PREVENTION PERTAINING TO THE HOME:  Any stairs in or around the home? Yes  If so, are there any without handrails? Yes  Home free of loose throw rugs in walkways, pet beds,  electrical cords, etc? Yes  Adequate lighting in your home to reduce risk of falls? Yes   ASSISTIVE DEVICES UTILIZED TO PREVENT FALLS:  Life alert? No  Use of a cane, walker or w/c? No  Grab bars in the bathroom? No  Shower chair or bench in shower? Yes  Elevated toilet seat or a handicapped toilet? No    Cognitive Function:        Immunizations Immunization  History  Administered Date(s) Administered   Influenza Whole 02/24/2011, 02/24/2012   Influenza,inj,Quad PF,6+ Mos 03/12/2019   Influenza-Unspecified 03/26/2018   Moderna Sars-Covid-2 Vaccination 07/13/2019, 08/10/2019   PNEUMOCOCCAL CONJUGATE-20 09/03/2021   Pneumococcal Conjugate-13 01/18/2014    TDAP status: Due, Education has been provided regarding the importance of this vaccine. Advised may receive this vaccine at local pharmacy or Health Dept. Aware to provide a copy of the vaccination record if obtained from local pharmacy or Health Dept. Verbalized acceptance and understanding.  Flu Vaccine status: Due, Education has been provided regarding the importance of this vaccine. Advised may receive this vaccine at local pharmacy or Health Dept. Aware to provide a copy of the vaccination record if obtained from local pharmacy or Health Dept. Verbalized acceptance and understanding.  Pneumococcal vaccine status: Up to date  Covid-19 vaccine status: Completed vaccines  Qualifies for Shingles Vaccine? Yes   Zostavax completed No   Shingrix Completed?: No.    Education has been provided regarding the importance of this vaccine. Patient has been advised to call insurance company to determine out of pocket expense if they have not yet received this vaccine. Advised may also receive vaccine at local pharmacy or Health Dept. Verbalized acceptance and understanding.  Screening Tests Health Maintenance  Topic Date Due   Hepatitis C Screening  Never done   TETANUS/TDAP  Never done   Zoster Vaccines- Shingrix (1 of 2) Never done   COVID-19 Vaccine (3 - Moderna risk series) 09/07/2019   INFLUENZA VACCINE  12/24/2021   COLONOSCOPY (Pts 45-19yr Insurance coverage will need to be confirmed)  12/25/2030   Pneumonia Vaccine 74 Years old  Completed   HPV VACCINES  Aged Out    Health Maintenance  Health Maintenance Due  Topic Date Due   Hepatitis C Screening  Never done   TETANUS/TDAP  Never  done   Zoster Vaccines- Shingrix (1 of 2) Never done   COVID-19 Vaccine (3 - Moderna risk series) 09/07/2019    Colorectal cancer screening: Type of screening: Colonoscopy. Completed 12-24-20. Repeat every 10 years  Lung Cancer Screening: (Low Dose CT Chest recommended if Age 74-80years, 30 pack-year currently smoking OR have quit w/in 15years.) does not qualify.     Additional Screening:  Hepatitis C Screening: does qualify; ordered 10-29-21  Vision Screening: Recommended annual ophthalmology exams for early detection of glaucoma and other disorders of the eye. Is the patient up to date with their annual eye exam?  Yes  Who is the provider or what is the name of the [facility] which the patient attends [facility] exams[facility], would they like to be referred to a provider to establish care? No .   Dental Screening: Recommended annual dental exams for proper oral hygiene  Community Resource Referral / Chronic Care Management: CRR required this visit?  No   CCM required this visit?  No      Plan:     I have personally reviewed and noted the following in  the patient's chart:   Medical and social history Use of alcohol, tobacco or illicit drugs  Current medications and supplements including opioid prescriptions. Patient is not currently taking opioid prescriptions. Functional abPatient is not currently taking opioid prescriptions.ility and status Nutritional status Physical activity Advanced directives List of other physicians Hospitalizations, surgeries, and ER visits in previous 12 months Vitals Screenings to include cognitive, depression, and falls Referrals and appointments  In addition, I have reviewed and discussed with patient certain preventive protocols, quality metrics, and best practice recommendations. A written personalized care plan for preventive services as well as general preventive health recommendations  were provided to patient.     Shelda Altes, CMA   10/29/2021   Nurse Notes:  Kyle Patel , Thank you for taking time to come for your Medicare Wellness Visit. I appreciate your ongoing commitment to your health goals. Please review the following plan we discussed and let me know if I can assist you in the future.   These are the goals we discussed:  Goals   None     This is a list of the screening recommended for you and due dates:  Health Maintenance  Topic Date Due   Hepatitis C Screening: USPSTF Recommendation to screen - Ages 70-79 yo.  Never done   Tetanus Vaccine  Never done   Zoster (Shingles) Vaccine (1 of 2) Never done   COVID-19 Vaccine (3 - Moderna risk series) 09/07/2019   Flu Shot  12/24/2021   Colon Cancer Screening  12/25/2030   Pneumonia Vaccine  Completed   HPV Vaccine  Aged Out

## 2021-10-29 NOTE — Patient Instructions (Signed)
Mr. Kyle Patel , Thank you for taking time to come for your Medicare Wellness Visit. I appreciate your ongoing commitment to your health goals. Please review the following plan we discussed and let me know if I can assist you in the future.   Screening recommendations/referrals: Colonoscopy: Completed  Recommended yearly ophthalmology/optometry visit for glaucoma screening and checkup Recommended yearly dental visit for hygiene and checkup  Vaccinations: Influenza vaccine: Due now  Pneumococcal vaccine: Completed Tdap vaccine: Due now  Shingles vaccine: Due now    Advanced directives: Information provided  Conditions/risks identified: Hypertension   Next appointment: 1 year   Preventive Care 74 Years and Older, Male Preventive care refers to lifestyle choices and visits with your health care provider that can promote health and wellness. What does preventive care include? A yearly physical exam. This is also called an annual well check. Dental exams once or twice a year. Routine eye exams. Ask your health care provider how often you should have your eyes checked. Personal lifestyle choices, including: Daily care of your teeth and gums. Regular physical activity. Eating a healthy diet. Avoiding tobacco and drug use. Limiting alcohol use. Practicing safe sex. Taking low doses of aspirin every day. Taking vitamin and mineral supplements as recommended by your health care provider. What happens during an annual well check? The services and screenings done by your health care provider during your annual well check will depend on your age, overall health, lifestyle risk factors, and family history of disease. Counseling  Your health care provider may ask you questions about your: Alcohol use. Tobacco use. Drug use. Emotional well-being. Home and relationship well-being. Sexual activity. Eating habits. History of falls. Memory and ability to understand (cognition). Work and work  Statistician. Screening  You may have the following tests or measurements: Height, weight, and BMI. Blood pressure. Lipid and cholesterol levels. These may be checked every 5 years, or more frequently if you are over 88 years old. Skin check. Lung cancer screening. You may have this screening every year starting at age 6 if you have a 30-pack-year history of smoking and currently smoke or have quit within the past 15 years. Fecal occult blood test (FOBT) of the stool. You may have this test every year starting at age 30. Flexible sigmoidoscopy or colonoscopy. You may have a sigmoidoscopy every 5 years or a colonoscopy every 10 years starting at age 59. Prostate cancer screening. Recommendations will vary depending on your family history and other risks. Hepatitis C blood test. Hepatitis B blood test. Sexually transmitted disease (STD) testing. Diabetes screening. This is done by checking your blood sugar (glucose) after you have not eaten for a while (fasting). You may have this done every 1-3 years. Abdominal aortic aneurysm (AAA) screening. You may need this if you are a current or former smoker. Osteoporosis. You may be screened starting at age 73 if you are at high risk. Talk with your health care provider about your test results, treatment options, and if necessary, the need for more tests. Vaccines  Your health care provider may recommend certain vaccines, such as: Influenza vaccine. This is recommended every year. Tetanus, diphtheria, and acellular pertussis (Tdap, Td) vaccine. You may need a Td booster every 10 years. Zoster vaccine. You may need this after age 71. Pneumococcal 13-valent conjugate (PCV13) vaccine. One dose is recommended after age 41. Pneumococcal polysaccharide (PPSV23) vaccine. One dose is recommended after age 3. Talk to your health care provider about which screenings and vaccines you need and  how often you need them. This information is not intended to replace  advice given to you by your health care provider. Make sure you discuss any questions you have with your health care provider. Document Released: 06/08/2015 Document Revised: 01/30/2016 Document Reviewed: 03/13/2015 Elsevier Interactive Patient Education  2017 [place] Prevention in the Home Falls can cause injuries. They can happen to people of all ages. There are many things you can do to make your home safe and to help prevent falls. What can I do on the outside of my home? Regularly fix the edges of walkways and driveways and fix any cracks. Remove anything that might make you trip as you walk through a door, such as a raised step or threshold. Trim any bushes or trees on the path to your home. Use bright outdoor lighting. Clear any walking paths of anything that might make someone trip, such as rocks or tools. Regularly check to see if handrails are loose or broken. Make sure that both sides of any steps have handrails. Any raised decks and porches should have guardrails on the edges. Have any leaves, snow, or ice cleared regularly. Use sand or salt on walking paths during winter. Clean up any spills in your garage right away. This includes oil or grease spills. What can I do in the bathroom? Use night lights. Install grab bars by the toilet and in the tub and shower. Do not use towel bars as grab bars. Use non-skid mats or decals in the tub or shower. If you need to sit down in the shower, use a plastic, non-slip stool. Keep the floor dry. Clean up any water that spills on the floor as soon as it happens. Remove soap buildup in the tub or shower regularly. Attach bath mats securely with double-sided non-slip rug tape. Do not have throw rugs and other things on the floor that can make you trip. What can I do in the bedroom? Use night lights. Make sure that you have a light by your bed that is easy to reach. Do not use any sheets or blankets that are too big for your bed.  They should not hang down onto the floor. Have a firm chair that has side arms. You can use this for support while you get dressed. Do not have throw rugs and other things on the floor that can make you trip. What can I do in the kitchen? Clean up any spills right away. Avoid walking on wet floors. Keep items that you use a lot in easy-to-reach places. If you need to reach something above you, use a strong step stool that has a grab bar. Keep electrical cords out of the way. Do not use floor polish or wax that makes floors slippery. If you must use wax, use non-skid floor wax. Do not have throw rugs and other things on the floor that can make you trip. What can I do with my stairs? Do not leave any items on the stairs. Make sure that there are handrails on both sides of the stairs and use them. Fix handrails that are broken or loose. Make sure that handrails are as long as the stairways. Check any carpeting to make sure that it is firmly attached to the stairs. Fix any carpet that is loose or worn. Avoid having throw rugs at the top or bottom of the stairs. If you do have throw rugs, attach them to the floor with carpet tape. Make sure that you have  a light switch at the top of the stairs and the bottom of the stairs. If you do not have them, ask someone to add them for you. What else can I do to help prevent falls? Wear shoes that: Do not have high heels. Have rubber bottoms. Are comfortable and fit you well. Are closed at the toe. Do not wear sandals. If you use a stepladder: Make sure that it is fully opened. Do not climb a closed stepladder. Make sure that both sides of the stepladder are locked into place. Ask someone to hold it for you, if possible. Clearly mark and make sure that you can see: Any grab bars or handrails. First and last steps. Where the edge of each step is. Use tools that help you move around (mobility aids) if they are needed. These  include: Canes. Walkers. Scooters. Crutches. Turn on the lights when you go into a dark area. Replace any light bulbs as soon as they burn out. Set up your furniture so you have a clear path. Avoid moving your furniture around. If any of your floors are uneven, fix them. If there are any pets around you, be aware of where they are. Review your medicines with your doctor. Some medicines can make you feel dizzy. This can increase your chance of falling. Ask your doctor what other things that you can do to help prevent falls. This information is not intended to replace advice given to you by your health care provider. Make sure you discuss any questions you have with your health care provider. Document Released: 03/08/2009 Document Revised: 10/18/2015 Document Reviewed: 06/16/2014 Elsevier Interactive Patient Education  2017 [employer].

## 2021-11-01 ENCOUNTER — Encounter (HOSPITAL_COMMUNITY): Payer: Self-pay | Admitting: Ophthalmology

## 2021-11-16 ENCOUNTER — Other Ambulatory Visit: Payer: Self-pay | Admitting: Cardiology

## 2021-11-19 ENCOUNTER — Telehealth: Payer: Self-pay | Admitting: Physician Assistant

## 2021-11-19 MED ORDER — AMLODIPINE BESYLATE 10 MG PO TABS: 10.0000 mg | ORAL_TABLET | Freq: Every day | ORAL | 3 refills | Status: DC

## 2021-11-19 NOTE — Telephone Encounter (Signed)
Pt notified that Norvasc was called in.

## 2021-11-29 ENCOUNTER — Other Ambulatory Visit: Payer: Self-pay | Admitting: Internal Medicine

## 2021-11-29 DIAGNOSIS — G609 Hereditary and idiopathic neuropathy, unspecified: Secondary | ICD-10-CM

## 2021-12-04 LAB — PROTEIN / CREATININE RATIO, URINE: Creatinine, Urine: 57

## 2021-12-15 ENCOUNTER — Ambulatory Visit
Admission: EM | Admit: 2021-12-15 | Discharge: 2021-12-15 | Disposition: A | Discharge status: Home / Self Care | Payer: Medicare Other | Attending: Nurse Practitioner | Admitting: Nurse Practitioner

## 2021-12-15 ENCOUNTER — Encounter: Payer: Self-pay | Admitting: Emergency Medicine

## 2021-12-15 ENCOUNTER — Ambulatory Visit (INDEPENDENT_AMBULATORY_CARE_PROVIDER_SITE_OTHER): Payer: Medicare Other

## 2021-12-15 DIAGNOSIS — M25552 Pain in left hip: Secondary | ICD-10-CM

## 2021-12-15 MED ORDER — TRAMADOL HCL 50 MG PO TABS: 50.0000 mg | ORAL_TABLET | Freq: Two times a day (BID) | ORAL | 0 refills | Status: DC | PRN

## 2021-12-15 MED ORDER — ACETAMINOPHEN ER 650 MG PO TBCR: 650.0000 mg | EXTENDED_RELEASE_TABLET | Freq: Three times a day (TID) | ORAL | 0 refills | Status: DC | PRN

## 2021-12-15 NOTE — Discharge Instructions (Addendum)
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 Take medication as prescribed.  I have prescribed arthritis strength Tylenol and tramadol for your symptoms.  Take the Tylenol first then take the tramadol for any breakthrough pain.  Do not take this medication with the pregabalin that you currently are prescribed. You will need to follow-up with your primary care physician or orthopedics for further evaluation.   Recommend following up with orthopedics.  You can follow-up with [facility] at [phone] or [facility] in [place] at [phone].  Recommend following up this week for further evaluation.

## 2021-12-15 NOTE — ED Triage Notes (Signed)
Left hip pain that radiates down left leg since yesterday.  States it's hard to walk

## 2021-12-15 NOTE — ED Provider Notes (Signed)
[facility]    CSN: [id number] Arrival date & time: 12/15/21  1344      History   Chief Complaint No chief complaint on file.   HPI Kyle Patel. is a 76 y.o. male.   The history is provided by the patient.   Patient presents for complaints of left hip pain that started over the past 24 to 48 hours.  Patient states pain is in the left hip and radiates into the left thigh.  States when he moves the left leg, the pain radiates down into the left thigh.He denies fall, injury, trauma, numbness, tingling, or weakness in the leg.  Patient states that he has pain with ambulating.  He denies previous history of hip problems.  He also denies previous history of sciatica.  States he has taken Tylenol for his symptoms without good relief.    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   Hyperlipidemia    Pulmonary embolism ([place]) 10/2011    Patient Active Problem List   Diagnosis Date Noted   CAD (coronary artery disease) 09/02/2021   Prediabetes 06/15/2020   Hyperlipidemia 04/27/2017   NSTEMI (non-ST elevated myocardial infarction) ([place]) 04/24/2017   History of colonic polyps    Diverticulosis of colon without hemorrhage    Family history of colon cancer 06/11/2015   Chronic foot pain 03/08/2015   Idiopathic peripheral neuropathy 05/31/2014   Lumbar radiculopathy 11/21/2013   Pain in joint, shoulder region 07/21/2012   Nocturnal muscle cramps 01/19/2012   Hypoxemia 11/10/2011   Hypertension 11/10/2011   CKD (chronic kidney disease) 11/10/2011   Osteoarthritis of knee 09/16/2011    Past Surgical History:  Procedure  Laterality Date   ABDOMINAL SURGERY     colon polyp removed 2007- sepsis, coma for 4 months per pt   BIOPSY  12/24/2020   Procedure: BIOPSY;  Surgeon: Eloise Harman, DO;  [facility];  Service: Endoscopy;;   CATARACT EXTRACTION W/PHACO Right 08/19/2021   Procedure: CATARACT EXTRACTION PHACO AND INTRAOCULAR LENS PLACEMENT ([place]);  Surgeon: Baruch Goldmann, MD;  Location: [facility];  Service: Ophthalmology;  Laterality: Right;  CDE: 7.02    CATARACT EXTRACTION W/PHACO Left 09/30/2021   Procedure: CATARACT EXTRACTION PHACO AND INTRAOCULAR LENS PLACEMENT ([facility]);  Surgeon: Baruch Goldmann, MD;  Location: [facility];  Service: Ophthalmology;  Laterality: Left;  CDE 4.61   COLONOSCOPY  03/22/2010   RMR: 1. Normal rectum 2. Diminutive polyp at the mouth of the ileocecal valve  status post cold biopsy removal. Remaideer of the colonic mucosa and terminal ileum mucosa appeared normal.    COLONOSCOPY N/A 06/25/2015   Procedure: COLONOSCOPY;  Surgeon: Daneil Dolin, MD;  [facility];  Service: Endoscopy;  Laterality: N/A;  200-rescheduled 1/30 per [place]    COLONOSCOPY WITH PROPOFOL N/A 12/24/2020   Procedure: COLONOSCOPY WITH PROPOFOL;  Surgeon: Eloise Harman, DO;  [facility];  Service: Endoscopy;  Laterality: N/A;  ASA III / 8:00   CORONARY BALLOON ANGIOPLASTY N/A 04/24/2017   Procedure: CORONARY BALLOON ANGIOPLASTY;  Surgeon: Jettie Booze, MD;  Location: [facility];  Service: Cardiovascular;  Laterality: N/A;   LEFT HEART CATH AND CORONARY ANGIOGRAPHY N/A 04/24/2017   Procedure: LEFT HEART CATH AND CORONARY ANGIOGRAPHY;  Surgeon: Jettie Booze, MD;  Location: [facility];  Service: Cardiovascular;  Laterality: N/A;   LUMBAR LAMINECTOMY  06/02/2012   LUMBAR LAMINECTOMY/DECOMPRESSION MICRODISCECTOMY  06/02/2012   Procedure: LUMBAR LAMINECTOMY/DECOMPRESSION MICRODISCECTOMY 1 LEVEL;  Surgeon: Floyce Stakes, MD;  Location: [place] [facility];  Service:  Neurosurgery;  Laterality: Bilateral;  Bilateral Lumbar five-sacral one Foraminotomy and microdiskectomy    POLYPECTOMY  12/24/2020   Procedure: POLYPECTOMY;  Surgeon: Eloise Harman, DO;  [facility];  Service: Endoscopy;;       Home Medications    Prior to Admission medications   Medication Sig Start Date End Date Taking? Authorizing Provider  acetaminophen (TYLENOL 8 HOUR) 650 MG CR tablet Take 1 tablet (650 mg total) by mouth every 8 (eight) hours as needed for pain. 12/15/21  Yes Tyriq Moragne-Warren, Alda Lea, NP  traMADol (ULTRAM) 50 MG tablet Take 1 tablet (50 mg total) by mouth every 12 (twelve) hours as needed. 12/15/21  Yes Soha Thorup-Warren, Alda Lea, NP  amLODipine (NORVASC) 10 MG tablet Take 1 tablet (10 mg total) by mouth daily. 11/19/21 11/14/22  Barrett, Evelene Croon, PA-C  aspirin 81 MG EC tablet Take 81 mg by mouth daily.    [provider]  calcitRIOL (ROCALTROL) 0.25 MCG capsule Take 0.25 mcg by mouth every Monday, Wednesday, and Friday. 07/09/20   [provider]  Coenzyme Q10-Vitamin E 100-150 MG-UNIT CAPS Take 1 capsule by mouth at bedtime.    [provider]  ezetimibe (ZETIA) 10 MG tablet TAKE (1) TABLET BY MOUTH ONCE DAILY. 07/17/21   Hilty, Nadean Corwin, MD  FARXIGA 5 MG TABS tablet Take 5 mg by mouth every morning. 08/24/21   [provider]  furosemide (LASIX) 20 MG tablet Take 20 mg by mouth daily.    [provider]  lisinopril (ZESTRIL) 40 MG tablet TAKE (1) TABLET BY MOUTH ONCE DAILY. 07/15/21   Satira Sark, MD  metoprolol succinate (TOPROL-XL) 50 MG 24 hr tablet Take 50 mg by mouth daily. 10/07/21   [provider]  Misc Natural Products (OSTEO BI-FLEX ADV JOINT SHIELD) TABS Take 1 tablet by mouth daily.    [provider]  Multiple Vitamins-Minerals (CENTRUM PO) Take 1 tablet by mouth every morning.    [provider]  nitroGLYCERIN (NITROSTAT) 0.4 MG SL tablet PLACE 1 TAB UNDER TONGUE  EVERY 5 MIN IF NEEDED FOR CHEST PAIN. MAY USE 3 TIMES.NO RELIEF CALL 911. Patient taking differently: Place 0.4 mg under the tongue every 5 (five) minutes as needed for chest pain. PLACE 1 TAB UNDER TONGUE EVERY 5 MIN IF NEEDED FOR CHEST PAIN. MAY USE 3 TIMES.NO RELIEF CALL 911. 01/21/20   Ahmed Prima, Fransisco Hertz, PA-C  pregabalin (LYRICA) 150 MG capsule Take 1 capsule (150 mg total) by mouth 2 (two) times daily. 11/29/21   Lindell Spar, MD  rosuvastatin (CRESTOR) 10 MG tablet TAKE (1) TABLET BY MOUTH ONCE DAILY. 11/18/21   Satira Sark, MD  sodium bicarbonate 650 MG tablet Take 650 mg by mouth 3 (three) times daily. 06/20/20   [provider]    Family History Family History  Problem Relation Age of Onset   Heart attack Mother    Diabetes Mother    Stroke Father    Heart attack Father  Arthritis Other    Colon cancer Sister 69       Estimated age of onset; just found out about it in 2015.   Neuropathy Neg Hx     Social History Social History   Tobacco Use   Smoking status: Former    Packs/day: 0.10    Years: 20.00    Total pack years: 2.00    Types: Cigarettes    Quit date: 05/08/1999    Years since quitting: 22.6   Smokeless tobacco: Never  Vaping Use   Vaping Use: Never used  Substance Use Topics   Alcohol use: No   Drug use: No     Allergies   Levaquin [levofloxacin] and Statins   Review of Systems Review of Systems Per HPI  Physical Exam Triage Vital Signs ED Triage Vitals  Enc Vitals Group     BP 12/15/21 1352 (!) 144/64     Pulse Rate 12/15/21 1352 (!) 59     Resp 12/15/21 1352 18     Temp 12/15/21 1352 (!) 97.5 F (36.4 C)     Temp Source 12/15/21 1352 Oral     SpO2 12/15/21 1352 91 %     Weight --      Height --      Head Circumference --      Peak Flow --      Pain Score 12/15/21 1353 10     Pain Loc --      Pain Edu? --      Excl. in [place]? --    No data found.  Updated Vital Signs BP (!) 144/64 (BP Location: Right Arm)    Pulse (!) 59   Temp (!) 97.5 F (36.4 C) (Oral)   Resp 18   SpO2 91%   Visual Acuity Right Eye Distance:   Left Eye Distance:   Bilateral Distance:    Right Eye Near:   Left Eye Near:    Bilateral Near:     Physical Exam Vitals and nursing note reviewed.  Constitutional:      General: He is not in acute distress.    Appearance: He is well-developed.  HENT:     Head: Normocephalic and atraumatic.  Eyes:     Conjunctiva/sclera: Conjunctivae normal.  Cardiovascular:     Rate and Rhythm: Normal rate and regular rhythm.     Heart sounds: No murmur heard. Pulmonary:     Effort: Pulmonary effort is normal. No respiratory distress.     Breath sounds: Normal breath sounds.  Abdominal:     Palpations: Abdomen is soft.     Tenderness: There is no abdominal tenderness.  Musculoskeletal:        General: No swelling.     Cervical back: Neck supple.     Left hip: Tenderness present. No deformity or crepitus. Decreased range of motion. Normal strength.     Comments: Tenderness noted to the greater trochanter of the left hip.  There is no obvious deformity, ecchymosis, or swelling present.  Skin:    General: Skin is warm and dry.     Capillary Refill: Capillary refill takes less than 2 seconds.  Neurological:     Mental Status: He is alert.  Psychiatric:        Mood and Affect: Mood normal.      UC Treatments / Results  Labs (all labs ordered are listed, but only abnormal results are displayed) Labs Reviewed - No data to display  EKG   Radiology DG  Hip Unilat With Pelvis 2-3 Views Left  Result Date: 12/15/2021 CLINICAL DATA:  Left hip pain for 1 day.  No known injury. EXAM: DG HIP (WITH OR WITHOUT PELVIS) 2-3V LEFT COMPARISON:  CT abdomen and pelvis 11/11/2011 and MRI left hip 10/07/2010 FINDINGS: There is again patchy sclerosis and lucency within the left-greater-than-right superior femoral heads consistent with the chronic avascular necrosis seen on prior remote  11/11/2011 CT. Minimal cortical depression of the superolateral left femoral head appears similar to 2013 CT. Moderate left inferior femoral head-neck junction degenerative osteophytosis. Severe superior left femoroacetabular joint space narrowing with bone-on-bone contact. Moderate superior right femoroacetabular joint space narrowing. Mild bilateral sacroiliac joint space narrowing. The pubic symphysis joint space is maintained. No acute fracture is seen.  No dislocation. IMPRESSION: 1. 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 2. 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 Electronically Signed   By: Yvonne Kendall M.D.   On: 12/15/2021 14:37    Procedures Procedures (including critical care time)  Medications Ordered in UC Medications - No data to display  Initial Impression / Assessment and Plan / UC Course  I have reviewed the triage vital signs and the nursing notes.  Pertinent labs & imaging results that were available during my care of the patient were reviewed by me and considered in my medical decision making (see chart for details).  Patient presents for complaints of left hip pain that started over the past 2 days.  He denies any injury, fall, or trauma.  On exam, patient has tenderness to the greater trochanter.  He also has decreased range of motion.  Patient reports difficulty ambulating.  X-rays show avascular necrosis bilaterally which is chronic, symptoms are greater on the left.  He also has osteoarthritis in the femoral acetabular region.  Will prescribe patient tramadol for his symptoms.  We will advised patient that he will need to follow-up with his primary care physician or orthopedics for further evaluation of the avascular necrosis.  Supportive care recommendations were provided to the patient.   Final Clinical Impressions(s) / UC  Diagnoses   Final diagnoses:  Left hip pain     Discharge Instructions      Your x-ray shows symptoms of avascular necrosis in both hips, but is worse on the left side.  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 Take medication as prescribed.  I have prescribed arthritis strength Tylenol and tramadol for your symptoms.  Take the Tylenol first then take the tramadol for any breakthrough pain.  Do not take this medication with the pregabalin that you currently are prescribed. You will need to follow-up with your primary care physician or orthopedics for further evaluation.   Recommend following up with orthopedics.  You can follow-up with [facility] at [phone] or [facility] in [place] at [phone].  Recommend following up this week for further evaluation.     ED Prescriptions     Medication Sig Dispense Auth. Provider   acetaminophen (TYLENOL 8 HOUR) 650 MG CR tablet Take 1 tablet (650 mg total) by mouth every 8 (eight) hours as needed for pain. 30 tablet Oktober Glazer-Warren, Alda Lea, NP   traMADol (ULTRAM) 50 MG tablet Take 1 tablet (50 mg total) by mouth every 12 (twelve) hours as needed. 10 tablet Jaecob Lowden-Warren, Alda Lea, NP      I have reviewed the PDMP during this encounter.  Tish Men, NP 12/15/21 1452

## 2021-12-19 ENCOUNTER — Other Ambulatory Visit: Payer: Self-pay | Admitting: Student

## 2022-01-06 ENCOUNTER — Ambulatory Visit: Payer: Medicare Other | Admitting: Internal Medicine

## 2022-01-06 ENCOUNTER — Encounter: Payer: Self-pay | Admitting: Internal Medicine

## 2022-01-06 ENCOUNTER — Encounter: Payer: Self-pay | Admitting: *Deleted

## 2022-01-06 VITALS — BP 124/56 | HR 70 | Resp 18 | Ht 69.5 in | Wt 204.2 lb

## 2022-01-06 DIAGNOSIS — I25110 Atherosclerotic heart disease of native coronary artery with unstable angina pectoris: Secondary | ICD-10-CM

## 2022-01-06 DIAGNOSIS — I10 Essential (primary) hypertension: Secondary | ICD-10-CM | POA: Diagnosis not present

## 2022-01-06 DIAGNOSIS — N1832 Chronic kidney disease, stage 3b: Secondary | ICD-10-CM | POA: Diagnosis not present

## 2022-01-06 DIAGNOSIS — G609 Hereditary and idiopathic neuropathy, unspecified: Secondary | ICD-10-CM

## 2022-01-06 DIAGNOSIS — E782 Mixed hyperlipidemia: Secondary | ICD-10-CM

## 2022-01-06 DIAGNOSIS — M170 Bilateral primary osteoarthritis of knee: Secondary | ICD-10-CM | POA: Diagnosis not present

## 2022-01-06 MED ORDER — PREGABALIN 200 MG PO CAPS: 200.0000 mg | ORAL_CAPSULE | Freq: Two times a day (BID) | ORAL | 5 refills | Status: DC

## 2022-01-06 NOTE — Patient Instructions (Addendum)
Please take Lyrica 200 mg twice daily for neuropathy. Okay to take 1 capsule of 150 mg in the morning and 2 capsules at nighttime.  Okay to take Tylenol arthritis for knee pain.  Please confirm dose of Metoprolol at home.  Please continue to follow low salt diet and ambulate as tolerated.

## 2022-01-10 NOTE — Assessment & Plan Note (Addendum)
Followed by nephrology - Dr. Theador Hawthorne, last visit note reviewed Recently had AKI on CKD Decreased dose of Lisinopril to 10 mg QD On lisinopril On Lasix every other day On calcitriol and sodium bicarb

## 2022-01-10 NOTE — Assessment & Plan Note (Signed)
On Lyrica 150 mg BID Uncontrolled Increased dose of Lyrica to 200 mg twice daily

## 2022-01-10 NOTE — Assessment & Plan Note (Signed)
BP Readings from Last 1 Encounters:  01/06/22 (!) 124/56   Well-controlled with lisinopril and amlodipine Counseled for compliance with the medications Advised DASH diet and moderate exercise/walking as tolerated

## 2022-01-10 NOTE — Assessment & Plan Note (Signed)
S/p stent placement On aspirin and statin On Metoprolol Denies chest pain or dyspnea currently 

## 2022-01-10 NOTE — Progress Notes (Signed)
Established Patient Office Visit  Subjective:  Patient ID: Kyle Baston., male    DOB: 1945-12-27  Age: 76 y.o. MRN: [id number]  CC:  Chief Complaint  Patient presents with   Follow-up    4 month follow up HTN and neuropathy neuropathy is no better and knees are also bothering him     HPI Kyle Patel. is a 76 y.o. male with past medical history of CAD, HTN, CKD, peripheral neuropathy and HLD who presents for f/u of his chronic medical conditions.  HTN and CAD: He has had stent placed for history of NSTEMI in 2018.  He is on aspirin and statin currently. BP is well-controlled. Takes medications regularly. Patient denies headache, dizziness, chest pain, dyspnea or palpitations.   CKD stage IIIb: Followed by Dr. Theador Hawthorne.  His last BMP showed GFR of 35, which was lower compared to his baseline.  He currently denies any dysuria, hematuria, urinary hesitance or resistance.  He has history of uncontrolled peripheral neuropathy, for which he takes Lyrica 150 mg twice daily.  He has chronic burning pain in his feet and also complains of intermittent weakness of the LE.  Denies any recent fall.     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/2014   Hyperlipidemia    Pulmonary embolism ([place]) 10/2011    Past Surgical History:  Procedure Laterality Date   ABDOMINAL SURGERY     colon polyp removed 2007- sepsis, coma for 4 months per pt   BIOPSY  12/24/2020   Procedure: BIOPSY;  Surgeon: Eloise Harman, DO;  [facility];  Service: Endoscopy;;   CATARACT EXTRACTION W/PHACO Right 08/19/2021   Procedure: CATARACT EXTRACTION PHACO  AND INTRAOCULAR LENS PLACEMENT ([place]);  Surgeon: Baruch Goldmann, MD;  Location: [facility];  Service: Ophthalmology;  Laterality: Right;  CDE: 7.02    CATARACT EXTRACTION W/PHACO Left 09/30/2021   Procedure: CATARACT EXTRACTION PHACO AND INTRAOCULAR LENS PLACEMENT ([facility]);  Surgeon: Baruch Goldmann, MD;  Location: [facility];  Service: Ophthalmology;  Laterality: Left;  CDE 4.61   COLONOSCOPY  03/22/2010   RMR: 1. Normal rectum 2. Diminutive polyp at the mouth of the ileocecal valve  status post cold biopsy removal. Remaideer of the colonic mucosa and terminal ileum mucosa appeared normal.    COLONOSCOPY N/A 06/25/2015   Procedure: COLONOSCOPY;  Surgeon: Daneil Dolin, MD;  [facility];  Service: Endoscopy;  Laterality: N/A;  200-rescheduled 1/30 per [place]    COLONOSCOPY WITH PROPOFOL N/A 12/24/2020   Procedure: COLONOSCOPY WITH PROPOFOL;  Surgeon: Eloise Harman, DO;  [facility];  Service: Endoscopy;  Laterality: N/A;  ASA III / 8:00   CORONARY BALLOON ANGIOPLASTY N/A 04/24/2017   Procedure: CORONARY BALLOON ANGIOPLASTY;  Surgeon: Jettie Booze, MD;  Location: [facility];  Service: Cardiovascular;  Laterality: N/A;   LEFT HEART CATH AND CORONARY ANGIOGRAPHY N/A 04/24/2017   Procedure: LEFT HEART CATH AND CORONARY ANGIOGRAPHY;  Surgeon: Jettie Booze, MD;  Location: [facility];  Service: Cardiovascular;  Laterality: N/A;   LUMBAR LAMINECTOMY  06/02/2012   LUMBAR LAMINECTOMY/DECOMPRESSION MICRODISCECTOMY  06/02/2012   Procedure: LUMBAR LAMINECTOMY/DECOMPRESSION MICRODISCECTOMY 1 LEVEL;  Surgeon: Floyce Stakes, MD;  Location: [facility];  Service: Neurosurgery;  Laterality: Bilateral;  Bilateral Lumbar five-sacral one Foraminotomy and microdiskectomy    POLYPECTOMY  12/24/2020   Procedure: POLYPECTOMY;  Surgeon: Eloise Harman, DO;  [facility];  Service: Endoscopy;;    Family History  Problem Relation Age of Onset   Heart attack Mother     Diabetes Mother    Stroke Father    Heart attack Father    Arthritis Other    Colon cancer Sister 65       Estimated age of onset; just found out about it in 2015.   Neuropathy Neg Hx     Social History   Socioeconomic History   Marital status: Married    Spouse name: Not on file   Number of children: 1   Years of education: Not on file   Highest education level: Not on file  Occupational History   Occupation: retired  Tobacco Use   Smoking status: Former    Packs/day: 0.10    Years: 20.00    Total pack years: 2.00    Types: Cigarettes    Quit date: 05/08/1999    Years since quitting: 22.6   Smokeless tobacco: Never  Vaping Use   Vaping Use: Never used  Substance and Sexual Activity   Alcohol use: No   Drug use: No   Sexual activity: Yes  Other Topics Concern   Not on file  Social History Narrative   Patient is right handed.   Patient drinks caffeine occasionally   Social Determinants of Health   Financial Resource Strain: Low Risk  (10/29/2021)   Overall Financial Resource Strain (CARDIA)    Difficulty of Paying Living Expenses: Not hard at all  Food Insecurity: No Food Insecurity (10/29/2021)   Hunger Vital Sign    Worried About Running Out of Food in the Last Year: Never true    Ran Out of Food in the Last Year: Never true  Transportation Needs: No Transportation Needs (10/29/2021)   PRAPARE - Hydrologist (Medical): No    Lack of Transportation (Non-Medical): No  Physical Activity: Inactive (10/29/2021)   Exercise Vital Sign    Days of Exercise per Week: 0 days    Minutes of Exercise per Session: 0 min  Stress: No Stress Concern Present (10/29/2021)   [place]    Feeling of Stress : Not at all  Social Connections: [place] (10/29/2021)   Social Connection and Isolation Panel [NHANES]    Frequency of Communication with Friends and Family: More than three  times a week    Frequency of Social Gatherings with Friends and Family: More than three times a week    Attends Religious Services: More than 4 times per year    Active Member of [employer] or Organizations: Yes    Attends Music therapist: More than 4 times per year    Marital Status: Married  Human resources officer Violence: Not At Risk (10/29/2021)   Humiliation, Afraid, Rape, and Kick questionnaire    Fear of Current or Ex-Partner: No    Emotionally Abused: No    Physically Abused: No    Sexually Abused: No    Outpatient Medications Prior to Visit  Medication Sig Dispense Refill   acetaminophen (TYLENOL 8 HOUR) 650 MG  CR tablet Take 1 tablet (650 mg total) by mouth every 8 (eight) hours as needed for pain. 30 tablet 0   amLODipine (NORVASC) 10 MG tablet Take 1 tablet (10 mg total) by mouth daily. 90 tablet 3   aspirin 81 MG EC tablet Take 81 mg by mouth daily.     calcitRIOL (ROCALTROL) 0.25 MCG capsule Take 0.25 mcg by mouth every Monday, Wednesday, and Friday.     Coenzyme Q10-Vitamin E 100-150 MG-UNIT CAPS Take 1 capsule by mouth at bedtime.     ezetimibe (ZETIA) 10 MG tablet TAKE (1) TABLET BY MOUTH ONCE DAILY. 90 tablet 3   furosemide (LASIX) 20 MG tablet TAKE 1 TABLET BY MOUTH ONCE DAILY. 90 tablet 2   lisinopril (ZESTRIL) 10 MG tablet Take 10 mg by mouth daily.     metoprolol succinate (TOPROL-XL) 50 MG 24 hr tablet Take 50 mg by mouth daily.     Misc Natural Products (OSTEO BI-FLEX ADV JOINT SHIELD) TABS Take 1 tablet by mouth daily.     Multiple Vitamins-Minerals (CENTRUM PO) Take 1 tablet by mouth every morning.     nitroGLYCERIN (NITROSTAT) 0.4 MG SL tablet PLACE 1 TAB UNDER TONGUE EVERY 5 MIN IF NEEDED FOR CHEST PAIN. MAY USE 3 TIMES.NO RELIEF CALL 911. (Patient taking differently: Place 0.4 mg under the tongue every 5 (five) minutes as needed for chest pain. PLACE 1 TAB UNDER TONGUE EVERY 5 MIN IF NEEDED FOR CHEST PAIN. MAY USE 3 TIMES.NO RELIEF CALL 911.) 25 tablet 3    rosuvastatin (CRESTOR) 10 MG tablet TAKE (1) TABLET BY MOUTH ONCE DAILY. 90 tablet 0   sodium bicarbonate 650 MG tablet Take 650 mg by mouth 3 (three) times daily.     FARXIGA 5 MG TABS tablet Take 5 mg by mouth every morning.     lisinopril (ZESTRIL) 40 MG tablet TAKE (1) TABLET BY MOUTH ONCE DAILY. 90 tablet 1   metoprolol succinate (TOPROL-XL) 25 MG 24 hr tablet Take 25 mg by mouth daily.     pregabalin (LYRICA) 150 MG capsule Take 1 capsule (150 mg total) by mouth 2 (two) times daily. 60 capsule 5   traMADol (ULTRAM) 50 MG tablet Take 1 tablet (50 mg total) by mouth every 12 (twelve) hours as needed. (Patient not taking: Reported on 01/06/2022) 10 tablet 0   No facility-administered medications prior to visit.    Allergies  Allergen Reactions   Levaquin [Levofloxacin] Itching   Statins     Myalgias    ROS Review of Systems  Constitutional:  Negative for chills and fever.  HENT:  Negative for congestion and sore throat.   Eyes:  Negative for pain and discharge.  Respiratory:  Negative for cough and shortness of breath.   Cardiovascular:  Negative for chest pain and palpitations.  Gastrointestinal:  Negative for diarrhea, nausea and vomiting.  Endocrine: Negative for polydipsia and polyuria.  Genitourinary:  Negative for dysuria and hematuria.  Musculoskeletal:  Negative for neck pain and neck stiffness.  Skin:  Negative for rash.  Neurological:  Positive for weakness and numbness. Negative for dizziness and headaches.  Psychiatric/Behavioral:  Negative for agitation and behavioral problems.       Objective:    Physical Exam Vitals reviewed.  Constitutional:      General: He is not in acute distress.    Appearance: He is not diaphoretic.  HENT:     Head: Normocephalic and atraumatic.     Nose: Nose normal.  Mouth/Throat:     Mouth: Mucous membranes are moist.  Eyes:     General: No scleral icterus.    Extraocular Movements: Extraocular movements intact.   Cardiovascular:     Rate and Rhythm: Normal rate and regular rhythm.     Pulses: Normal pulses.     Heart sounds: Normal heart sounds. No murmur heard. Pulmonary:     Breath sounds: Normal breath sounds. No wheezing or rales.  Musculoskeletal:     Cervical back: Neck supple. No tenderness.     Right lower leg: No edema.     Left lower leg: No edema.  Skin:    General: Skin is warm.     Findings: No rash.  Neurological:     General: No focal deficit present.     Mental Status: He is alert and oriented to person, place, and time.     Sensory: Sensory deficit (B/l feet) present.     Motor: Weakness (4/5 in b/l LE) present.  Psychiatric:        Mood and Affect: Mood normal.        Behavior: Behavior normal.     BP (!) 124/56 (BP Location: Right Arm, Patient Position: Sitting, Cuff Size: Normal)   Pulse 70   Resp 18   Ht 5' 9.5" (1.765 m)   Wt 204 lb 3.2 oz (92.6 kg)   SpO2 93%   BMI 29.72 kg/m  Wt Readings from Last 3 Encounters:  01/06/22 204 lb 3.2 oz (92.6 kg)  10/22/21 207 lb (93.9 kg)  09/24/21 204 lb 12.9 oz (92.9 kg)    Lab Results  Component Value Date   TSH 1.63 08/08/2019   Lab Results  Component Value Date   WBC 9.0 08/08/2019   HGB 13.4 08/08/2019   HCT 40.3 08/08/2019   MCV 92.9 08/08/2019   PLT 198 08/08/2019   Lab Results  Component Value Date   NA 140 12/20/2020   K 4.6 12/20/2020   CO2 21 (L) 12/20/2020   GLUCOSE 133 (H) 12/20/2020   BUN 27 (H) 12/20/2020   CREATININE 1.89 (H) 12/20/2020   BILITOT 0.4 08/08/2019   ALKPHOS 56 05/14/2018   AST 26 08/08/2019   ALT 18 08/08/2019   PROT 7.3 08/08/2019   ALBUMIN 3.9 05/14/2018   CALCIUM 8.5 (L) 12/20/2020   ANIONGAP 6 12/20/2020   Lab Results  Component Value Date   CHOL 238 (H) 07/04/2019   Lab Results  Component Value Date   HDL 53 07/04/2019   Lab Results  Component Value Date   LDLCALC 163 (H) 07/04/2019   Lab Results  Component Value Date   TRIG 124 07/04/2019   Lab  Results  Component Value Date   CHOLHDL 4.5 07/04/2019   Lab Results  Component Value Date   HGBA1C 6.4 (H) 04/24/2017      Assessment & Plan:   Problem List Items Addressed This Visit       Cardiovascular and Mediastinum   Hypertension - Primary    BP Readings from Last 1 Encounters:  01/06/22 (!) 124/56  Well-controlled with lisinopril and amlodipine Counseled for compliance with the medications Advised DASH diet and moderate exercise/walking as tolerated      Relevant Medications   lisinopril (ZESTRIL) 10 MG tablet   CAD (coronary artery disease)    S/p stent placement On aspirin and statin On Metoprolol Denies chest pain or dyspnea currently      Relevant Medications   lisinopril (ZESTRIL) 10 MG tablet  Nervous and Auditory   Idiopathic peripheral neuropathy    On Lyrica 150 mg BID Uncontrolled Increased dose of Lyrica to 200 mg twice daily      Relevant Medications   pregabalin (LYRICA) 200 MG capsule     Musculoskeletal and Integument   Osteoarthritis of knee    Advised to take Tylenol arthritis as needed for knee pain Avoid oral NSAIDs due to CKD        Genitourinary   CKD (chronic kidney disease)    Followed by nephrology - Dr. Theador Hawthorne, last visit note reviewed Recently had AKI on CKD Decreased dose of Lisinopril to 10 mg QD On lisinopril On Lasix every other day On calcitriol and sodium bicarb        Other   Hyperlipidemia    On statin and Zetia currently      Relevant Medications   lisinopril (ZESTRIL) 10 MG tablet    Meds ordered this encounter  Medications   pregabalin (LYRICA) 200 MG capsule    Sig: Take 1 capsule (200 mg total) by mouth 2 (two) times daily.    Dispense:  60 capsule    Refill:  5    Dose change    Follow-up: Return in about 4 months (around 05/08/2022) for Annual physical.    Lindell Spar, MD

## 2022-01-10 NOTE — Assessment & Plan Note (Signed)
On statin and Zetia currently

## 2022-01-10 NOTE — Assessment & Plan Note (Signed)
Advised to take Tylenol arthritis as needed for knee pain Avoid oral NSAIDs due to CKD

## 2022-02-18 ENCOUNTER — Telehealth: Payer: Self-pay | Admitting: Internal Medicine

## 2022-02-18 ENCOUNTER — Telehealth: Payer: Self-pay | Admitting: *Deleted

## 2022-02-18 NOTE — Telephone Encounter (Signed)
Surgical clearance forms   Noted  Copied Sleeved   Original in provider box Copy in brown folder   Pt appt 9/28

## 2022-02-18 NOTE — Telephone Encounter (Signed)
   Name: Kyle Patel.  DOB: 09-30-1945  MRN: [id number]  Primary Cardiologist: Rozann Lesches, MD  Chart reviewed as part of pre-operative protocol coverage. Because of Kyle FINIGAN Jr.'s past medical history and time since last visit, he will require a follow-up virtual visit in order to better assess preoperative cardiovascular risk.  Pre-op covering staff: - Please schedule appointment and call patient to inform them. If patient already had an upcoming appointment within acceptable timeframe, please add "pre-op clearance" to the appointment notes so provider is aware. - Please contact requesting surgeon's office via preferred method (i.e, phone, fax) to inform them of need for appointment prior to surgery.  Will route to surgical team so they are aware.   Loel Dubonnet, NP  02/18/2022, 5:53 PM

## 2022-02-18 NOTE — Telephone Encounter (Signed)
   Pre-operative Risk Assessment    Patient Name: Kyle Patel.  DOB: [birth year] MRN: [id number]      Request for Surgical Clearance    Procedure:   RIGHT TOTAL KNEE REPLACEMENT  Date of Surgery:  Clearance TBD                                 Surgeon:  Earlie Server, MD Surgeon's Group or Practice Name:  [facility] Phone number:  [phone] XT: [place] Fax number:  [phone]   Type of Clearance Requested:   - Medical  - Pharmacy:  Hold Aspirin NOT INDICATED HOW LONG   Type of Anesthesia:  Spinal W/NERVE [facility]   Additional requests/questions:    Astrid Divine   02/18/2022, 1:19 PM

## 2022-02-20 ENCOUNTER — Telehealth: Payer: Self-pay

## 2022-02-20 ENCOUNTER — Ambulatory Visit: Payer: Medicare Other | Admitting: Internal Medicine

## 2022-02-20 NOTE — Telephone Encounter (Signed)
  Patient Consent for Virtual Visit        Kyle Patel. has provided verbal consent on 02/20/2022 for a virtual visit (video or telephone).   CONSENT FOR VIRTUAL VISIT FOR:  Kyle Patel.  By participating in this virtual visit I agree to the following:  I hereby voluntarily request, consent and authorize [facility] and its employed or contracted physicians, physician assistants, nurse practitioners or other licensed health care professionals (the Practitioner), to provide me with telemedicine health care services (the "Services") as deemed necessary by the treating Practitioner. I acknowledge and consent to receive the Services by the Practitioner via telemedicine. I understand that the telemedicine visit will involve communicating with the Practitioner through live audiovisual communication technology and the disclosure of certain medical information by electronic transmission. I acknowledge that I have been given the opportunity to request an in-person assessment or other available alternative prior to the telemedicine visit and am voluntarily participating in the telemedicine visit.  I understand that I have the right to withhold or withdraw my consent to the use of telemedicine in the course of my care at any time, without affecting my right to future care or treatment, and that the Practitioner or I may terminate the telemedicine visit at any time. I understand that I have the right to inspect all information obtained and/or recorded in the course of the telemedicine visit and may receive copies of available information for a reasonable fee.  I understand that some of the potential risks of receiving the Services via telemedicine include:  Delay or interruption in medical evaluation due to technological equipment failure or disruption; Information transmitted may not be sufficient (e.g. poor resolution of images) to allow for appropriate medical decision making by the  Practitioner; and/or  In rare instances, security protocols could fail, causing a breach of personal health information.  Furthermore, I acknowledge that it is my responsibility to provide information about my medical history, conditions and care that is complete and accurate to the best of my ability. I acknowledge that Practitioner's advice, recommendations, and/or decision may be based on factors not within their control, such as incomplete or inaccurate data provided by me or distortions of diagnostic images or specimens that may result from electronic transmissions. I understand that the practice of medicine is not an exact science and that Practitioner makes no warranties or guarantees regarding treatment outcomes. I acknowledge that a copy of this consent can be made available to me via my patient portal ([facility]), or I can request a printed copy by calling the office of [facility].    I understand that my insurance will be billed for this visit.   I have read or had this consent read to me. I understand the contents of this consent, which adequately explains the benefits and risks of the Services being provided via telemedicine.  I have been provided ample opportunity to ask questions regarding this consent and the Services and have had my questions answered to my satisfaction. I give my informed consent for the services to be provided through the use of telemedicine in my medical care

## 2022-02-20 NOTE — Telephone Encounter (Signed)
Spoke with patient who is agreeable to a tele visit on 11/10 at 9 am. Consent is completed but med rec needs to be done.

## 2022-02-21 ENCOUNTER — Ambulatory Visit: Payer: Medicare Other | Admitting: Internal Medicine

## 2022-02-21 ENCOUNTER — Encounter: Payer: Self-pay | Admitting: Internal Medicine

## 2022-02-21 VITALS — BP 134/60 | HR 58 | Resp 16 | Ht 69.5 in | Wt 212.0 lb

## 2022-02-21 DIAGNOSIS — Z01818 Encounter for other preprocedural examination: Secondary | ICD-10-CM | POA: Insufficient documentation

## 2022-02-21 DIAGNOSIS — M170 Bilateral primary osteoarthritis of knee: Secondary | ICD-10-CM

## 2022-02-21 DIAGNOSIS — I25110 Atherosclerotic heart disease of native coronary artery with unstable angina pectoris: Secondary | ICD-10-CM | POA: Diagnosis not present

## 2022-02-21 DIAGNOSIS — I10 Essential (primary) hypertension: Secondary | ICD-10-CM | POA: Diagnosis not present

## 2022-02-21 DIAGNOSIS — M7989 Other specified soft tissue disorders: Secondary | ICD-10-CM

## 2022-02-21 NOTE — Progress Notes (Signed)
Acute Office Visit  Subjective:    Patient ID: Kyle Patel., male    DOB: January 16, 1946, 76 y.o.   MRN: [id number]  Chief Complaint  Patient presents with   Pre-op Exam    Surgical clearance     HPI Patient is in today for preop evaluation for right TKA. BP is well-controlled. Takes medications regularly. Patient denies headache, dizziness, chest pain, dyspnea or palpitations. He is able to walk up to 100 meter without dyspnea. He can get up to 1 flight of stairs.  He reports chronic left ankle swelling, but denies any recent injury. Of note, he has h/o OA of knee b/l. He is on Amlodipine for HTN. He reports good urine output with Lasix.   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   Hyperlipidemia    Pulmonary embolism ([place]) 10/2011    Past Surgical History:  Procedure Laterality Date   ABDOMINAL SURGERY     colon polyp removed 2007- sepsis, coma for 4 months per pt   BIOPSY  12/24/2020   Procedure: BIOPSY;  Surgeon: Eloise Harman, DO;  [facility];  Service: Endoscopy;;   CATARACT EXTRACTION W/PHACO Right 08/19/2021   Procedure: CATARACT EXTRACTION PHACO AND INTRAOCULAR LENS PLACEMENT ([place]);  Surgeon: Baruch Goldmann, MD;  Location: [facility];  Service: Ophthalmology;  Laterality: Right;  CDE: 7.02    CATARACT EXTRACTION W/PHACO Left 09/30/2021   Procedure: CATARACT EXTRACTION PHACO AND INTRAOCULAR LENS PLACEMENT (I[facility]);  Surgeon: Baruch Goldmann, MD;  Location: [facility];  Service: Ophthalmology;  Laterality: Left;  CDE 4.61   COLONOSCOPY  03/22/2010   RMR: 1. Normal rectum 2. Diminutive polyp at the mouth of the  ileocecal valve  status post cold biopsy removal. Remaideer of the colonic mucosa and terminal ileum mucosa appeared normal.    COLONOSCOPY N/A 06/25/2015   Procedure: COLONOSCOPY;  Surgeon: Daneil Dolin, MD;  [facility];  Service: Endoscopy;  Laterality: N/A;  200-rescheduled 1/30 per [place]    COLONOSCOPY WITH PROPOFOL N/A 12/24/2020   Procedure: COLONOSCOPY WITH PROPOFOL;  Surgeon: Eloise Harman, DO;  [facility];  Service: Endoscopy;  Laterality: N/A;  ASA III / 8:00   CORONARY BALLOON ANGIOPLASTY N/A 04/24/2017   Procedure: CORONARY BALLOON ANGIOPLASTY;  Surgeon: Jettie Booze, MD;  Location: [facility];  Service: Cardiovascular;  Laterality: N/A;   LEFT HEART CATH AND CORONARY ANGIOGRAPHY N/A 04/24/2017   Procedure: LEFT HEART CATH AND CORONARY ANGIOGRAPHY;  Surgeon: Jettie Booze, MD;  Location: [facility];  Service: Cardiovascular;  Laterality: N/A;   LUMBAR LAMINECTOMY  06/02/2012   LUMBAR LAMINECTOMY/DECOMPRESSION MICRODISCECTOMY  06/02/2012   Procedure: LUMBAR LAMINECTOMY/DECOMPRESSION MICRODISCECTOMY 1 LEVEL;  Surgeon: Floyce Stakes, MD;  Location: [facility];  Service: Neurosurgery;  Laterality: Bilateral;  Bilateral Lumbar five-sacral one Foraminotomy and microdiskectomy    POLYPECTOMY  12/24/2020   Procedure: POLYPECTOMY;  Surgeon: Eloise Harman, DO;  [facility];  Service: Endoscopy;;    Family History  Problem Relation Age of Onset   Heart attack Mother  Diabetes Mother    Stroke Father    Heart attack Father    Arthritis Other    Colon cancer Sister 57       Estimated age of onset; just found out about it in 67.   Neuropathy Neg Hx     Social History   Socioeconomic History   Marital status: Married    Spouse name: Not on file   Number of children: 1   Years of education: Not on file   Highest education level: Not on file  Occupational History   Occupation: retired  Tobacco Use    Smoking status: Former    Packs/day: 0.10    Years: 20.00    Total pack years: 2.00    Types: Cigarettes    Quit date: 05/08/1999    Years since quitting: 22.8   Smokeless tobacco: Never  Vaping Use   Vaping Use: Never used  Substance and Sexual Activity   Alcohol use: No   Drug use: No   Sexual activity: Yes  Other Topics Concern   Not on file  Social History Narrative   Patient is right handed.   Patient drinks caffeine occasionally   Social Determinants of Health   Financial Resource Strain: Low Risk  (10/29/2021)   Overall Financial Resource Strain (CARDIA)    Difficulty of Paying Living Expenses: Not hard at all  Food Insecurity: No Food Insecurity (10/29/2021)   Hunger Vital Sign    Worried About Running Out of Food in the Last Year: Never true    Ran Out of Food in the Last Year: Never true  Transportation Needs: No Transportation Needs (10/29/2021)   PRAPARE - Hydrologist (Medical): No    Lack of Transportation (Non-Medical): No  Physical Activity: Inactive (10/29/2021)   Exercise Vital Sign    Days of Exercise per Week: 0 days    Minutes of Exercise per Session: 0 min  Stress: No Stress Concern Present (10/29/2021)   [place]    Feeling of Stress : Not at all  Social Connections: [place] (10/29/2021)   Social Connection and Isolation Panel [NHANES]    Frequency of Communication with Friends and Family: More than three times a week    Frequency of Social Gatherings with Friends and Family: More than three times a week    Attends Religious Services: More than 4 times per year    Active Member of [employer] or Organizations: Yes    Attends Music therapist: More than 4 times per year    Marital Status: Married  Human resources officer Violence: Not At Risk (10/29/2021)   Humiliation, Afraid, Rape, and Kick questionnaire    Fear of Current or Ex-Partner: No     Emotionally Abused: No    Physically Abused: No    Sexually Abused: No    Outpatient Medications Prior to Visit  Medication Sig Dispense Refill   acetaminophen (TYLENOL 8 HOUR) 650 MG CR tablet Take 1 tablet (650 mg total) by mouth every 8 (eight) hours as needed for pain. 30 tablet 0   amLODipine (NORVASC) 10 MG tablet Take 1 tablet (10 mg total) by mouth daily. 90 tablet 3   aspirin 81 MG EC tablet Take 81 mg by mouth daily.     calcitRIOL (ROCALTROL) 0.25 MCG capsule Take 0.25 mcg by mouth every Monday, Wednesday, and Friday.     Coenzyme Q10-Vitamin E 100-150 MG-UNIT CAPS Take 1  capsule by mouth at bedtime.     ezetimibe (ZETIA) 10 MG tablet TAKE (1) TABLET BY MOUTH ONCE DAILY. 90 tablet 3   furosemide (LASIX) 20 MG tablet TAKE 1 TABLET BY MOUTH ONCE DAILY. 90 tablet 2   lisinopril (ZESTRIL) 10 MG tablet Take 10 mg by mouth daily.     metoprolol succinate (TOPROL-XL) 50 MG 24 hr tablet Take 50 mg by mouth daily.     Misc Natural Products (OSTEO BI-FLEX ADV JOINT SHIELD) TABS Take 1 tablet by mouth daily.     Multiple Vitamins-Minerals (CENTRUM PO) Take 1 tablet by mouth every morning.     nitroGLYCERIN (NITROSTAT) 0.4 MG SL tablet PLACE 1 TAB UNDER TONGUE EVERY 5 MIN IF NEEDED FOR CHEST PAIN. MAY USE 3 TIMES.NO RELIEF CALL 911. (Patient taking differently: Place 0.4 mg under the tongue every 5 (five) minutes as needed for chest pain. PLACE 1 TAB UNDER TONGUE EVERY 5 MIN IF NEEDED FOR CHEST PAIN. MAY USE 3 TIMES.NO RELIEF CALL 911.) 25 tablet 3   pregabalin (LYRICA) 200 MG capsule Take 1 capsule (200 mg total) by mouth 2 (two) times daily. 60 capsule 5   rosuvastatin (CRESTOR) 10 MG tablet TAKE (1) TABLET BY MOUTH ONCE DAILY. 90 tablet 0   sodium bicarbonate 650 MG tablet Take 650 mg by mouth 3 (three) times daily.     traMADol (ULTRAM) 50 MG tablet Take 1 tablet (50 mg total) by mouth every 12 (twelve) hours as needed. (Patient not taking: Reported on 01/06/2022) 10 tablet 0   No  facility-administered medications prior to visit.    Allergies  Allergen Reactions   Levaquin [Levofloxacin] Itching   Statins     Myalgias    Review of Systems  Constitutional:  Negative for chills and fever.  HENT:  Negative for congestion and sore throat.   Eyes:  Negative for pain and discharge.  Respiratory:  Negative for cough and shortness of breath.   Cardiovascular:  Positive for leg swelling. Negative for chest pain and palpitations.  Gastrointestinal:  Negative for diarrhea, nausea and vomiting.  Endocrine: Negative for polydipsia and polyuria.  Genitourinary:  Negative for dysuria and hematuria.  Musculoskeletal:  Positive for arthralgias and joint swelling. Negative for neck pain and neck stiffness.  Skin:  Negative for rash.  Neurological:  Positive for weakness and numbness. Negative for dizziness and headaches.  Psychiatric/Behavioral:  Negative for agitation and behavioral problems.        Objective:    Physical Exam Vitals reviewed.  Constitutional:      General: He is not in acute distress.    Appearance: He is not diaphoretic.  HENT:     Head: Normocephalic and atraumatic.     Nose: Nose normal.     Mouth/Throat:     Mouth: Mucous membranes are moist.  Eyes:     General: No scleral icterus.    Extraocular Movements: Extraocular movements intact.  Cardiovascular:     Rate and Rhythm: Normal rate and regular rhythm.     Pulses: Normal pulses.     Heart sounds: Normal heart sounds. No murmur heard. Pulmonary:     Breath sounds: Normal breath sounds. No wheezing or rales.  Musculoskeletal:     Cervical back: Neck supple. No tenderness.     Right lower leg: No edema.     Left lower leg: Edema (1+) present.  Skin:    General: Skin is warm.     Findings: No rash.  Neurological:  General: No focal deficit present.     Mental Status: He is alert and oriented to person, place, and time.     Sensory: Sensory deficit (B/l feet) present.     Motor:  Weakness (4/5 in b/l LE) present.  Psychiatric:        Mood and Affect: Mood normal.        Behavior: Behavior normal.     BP 134/60 (BP Location: Left Arm, Cuff Size: Normal)   Pulse (!) 58   Resp 16   Ht 5' 9.5" (1.765 m)   Wt 212 lb (96.2 kg)   SpO2 95%   BMI 30.86 kg/m  Wt Readings from Last 3 Encounters:  02/21/22 212 lb (96.2 kg)  01/06/22 204 lb 3.2 oz (92.6 kg)  10/22/21 207 lb (93.9 kg)        Assessment & Plan:   Problem List Items Addressed This Visit       Cardiovascular and Mediastinum   Hypertension    BP Readings from Last 1 Encounters:  02/21/22 134/60  Well-controlled with lisinopril and amlodipine Counseled for compliance with the medications Advised DASH diet and moderate exercise/walking as tolerated      CAD (coronary artery disease)    S/p stent placement On aspirin and statin On Metoprolol Denies chest pain or dyspnea currently        Musculoskeletal and Integument   Osteoarthritis of knee    Advised to take Tylenol arthritis as needed for knee pain Avoid oral NSAIDs due to CKD Planned to get right TKA        Other   Preop examination - Primary    RCRI: 1 Able to walk at ground level and up to 1 flight of stairs without overt angina BP wnl Last EKG reviewed from chart, has RBBB Medically optimized with moderate risk for right TKA      Leg swelling    Likely due to chronic venous insufficiency and/or due to amlodipine Advised to try leg elevation and compression stocking On Lasix currently        No orders of the defined types were placed in this encounter.    Lindell Spar, MD

## 2022-02-21 NOTE — Patient Instructions (Signed)
Please continue taking medications as prescribed.  Please keep legs elevated and use compression stocking for leg swelling.  Please follow low salt diet and ambulate as tolerated.

## 2022-02-21 NOTE — Assessment & Plan Note (Signed)
BP Readings from Last 1 Encounters:  02/21/22 134/60   Well-controlled with lisinopril and amlodipine Counseled for compliance with the medications Advised DASH diet and moderate exercise/walking as tolerated

## 2022-02-21 NOTE — Telephone Encounter (Signed)
Forms were faxed. [facility].  Copy in front folder

## 2022-02-21 NOTE — Assessment & Plan Note (Signed)
RCRI: 1 Able to walk at ground level and up to 1 flight of stairs without overt angina BP wnl Last EKG reviewed from chart, has RBBB Medically optimized with moderate risk for right TKA

## 2022-02-21 NOTE — Assessment & Plan Note (Signed)
Likely due to chronic venous insufficiency and/or due to amlodipine Advised to try leg elevation and compression stocking On Lasix currently

## 2022-02-21 NOTE — Assessment & Plan Note (Signed)
S/p stent placement On aspirin and statin On Metoprolol Denies chest pain or dyspnea currently 

## 2022-02-21 NOTE — Assessment & Plan Note (Signed)
Advised to take Tylenol arthritis as needed for knee pain Avoid oral NSAIDs due to CKD Planned to get right TKA 

## 2022-02-22 ENCOUNTER — Other Ambulatory Visit: Payer: Self-pay | Admitting: Cardiology

## 2022-04-01 ENCOUNTER — Telehealth: Payer: Self-pay | Admitting: *Deleted

## 2022-04-01 NOTE — Telephone Encounter (Signed)
I s/w the pt and he said he got a reminder call for his tele visit 04/04/22 that he was going to need a smart phone. I assured the pt that he does not need a smart phone. I assured the pt that this is just a regular phone call, no video. Pt said thank you for the help.   Med rec is done.

## 2022-04-02 NOTE — Progress Notes (Signed)
Virtual Visit via Telephone Note   Because of Kyle CARRENO Jr.'s co-morbid illnesses, he is at least at moderate risk for complications without adequate follow up.  This format is felt to be most appropriate for this patient at this time.  The patient did not have access to video technology/had technical difficulties with video requiring transitioning to audio format only (telephone).  All issues noted in this document were discussed and addressed.  No physical exam could be performed with this format.  Please refer to the patient's chart for his consent to telehealth for [facility].  Evaluation Performed:  Preoperative cardiovascular risk assessment _____________   Date:  04/02/2022   Patient ID:  Kyle Kathol., DOB April 16, 1946, MRN [id number] Patient Location:  Home Provider location:   Office  Primary Care Provider:  Lindell Spar, MD Primary Cardiologist:  Rozann Lesches, MD  Chief Complaint / Patient Profile   76 y.o. y/o male with a h/o CAD s/p NSTEMI 2018 with PTCA dLAD, with nuclear stress test 2022 with scar evident, no ischemia, HTN, HLD with statin intolerance, PE 2013 off warfarin x 10 years, COPD, CKD,  who is pending right total knee replacement and presents today for telephonic preoperative cardiovascular risk assessment.  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   Hyperlipidemia    Pulmonary embolism ([place]) 10/2011   Past Surgical History:  Procedure Laterality Date   ABDOMINAL SURGERY     colon polyp removed 2007- sepsis, coma for 4 months per pt   BIOPSY  12/24/2020    Procedure: BIOPSY;  Surgeon: Eloise Harman, DO;  [facility];  Service: Endoscopy;;   CATARACT EXTRACTION W/PHACO Right 08/19/2021   Procedure: CATARACT EXTRACTION PHACO AND INTRAOCULAR LENS PLACEMENT ([place]);  Surgeon: Baruch Goldmann, MD;  Location: [facility];  Service: Ophthalmology;  Laterality: Right;  CDE: 7.02    CATARACT EXTRACTION W/PHACO Left 09/30/2021   Procedure: CATARACT EXTRACTION PHACO AND INTRAOCULAR LENS PLACEMENT ([facility]);  Surgeon: Baruch Goldmann, MD;  Location: [facility];  Service: Ophthalmology;  Laterality: Left;  CDE 4.61   COLONOSCOPY  03/22/2010   RMR: 1. Normal rectum 2. Diminutive polyp at the mouth of the ileocecal valve  status post cold biopsy removal. Remaideer of the colonic mucosa and terminal ileum mucosa appeared normal.    COLONOSCOPY N/A 06/25/2015   Procedure: COLONOSCOPY;  Surgeon: Daneil Dolin, MD;  [facility];  Service: Endoscopy;  Laterality: N/A;  200-rescheduled 1/30 per [place]    COLONOSCOPY WITH PROPOFOL N/A 12/24/2020   Procedure: COLONOSCOPY WITH PROPOFOL;  Surgeon: Eloise Harman, DO;  [facility];  Service: Endoscopy;  Laterality: N/A;  ASA III / 8:00   CORONARY BALLOON ANGIOPLASTY N/A 04/24/2017   Procedure: CORONARY BALLOON ANGIOPLASTY;  Surgeon: Jettie Booze, MD;  Location: [facility];  Service: Cardiovascular;  Laterality: N/A;   LEFT HEART CATH AND CORONARY ANGIOGRAPHY N/A 04/24/2017   Procedure: LEFT HEART CATH AND CORONARY ANGIOGRAPHY;  Surgeon: Jettie Booze, MD;  Location: [place] [facility];  Service: Cardiovascular;  Laterality: N/A;   LUMBAR LAMINECTOMY  06/02/2012   LUMBAR LAMINECTOMY/DECOMPRESSION MICRODISCECTOMY  06/02/2012   Procedure: LUMBAR LAMINECTOMY/DECOMPRESSION MICRODISCECTOMY 1 LEVEL;  Surgeon: Floyce Stakes, MD;  Location: [place] [facility];  Service: Neurosurgery;  Laterality: Bilateral;  Bilateral Lumbar five-sacral one Foraminotomy and microdiskectomy    POLYPECTOMY   12/24/2020   Procedure: POLYPECTOMY;  Surgeon: Eloise Harman, DO;  [facility];  Service: Endoscopy;;    Allergies  Allergies  Allergen Reactions   Levaquin [Levofloxacin] Itching   Statins     Myalgias    History of Present Illness    Kyle Patel. is a 76 y.o. male in cardiology clinic on 10/22/21 by Rosaria Ferries, PA.  At that time Kyle Charleston. Was doing well.  Amlodipine was increased to 10 mg daily due to elevated BP.  The patient is now pending procedure as outlined above. Since his last visit, he denies chest pain, shortness of breath, lower extremity edema, fatigue, palpitations, melena, hematuria, hemoptysis, diaphoresis, weakness, presyncope, syncope, orthopnea, and PND.  He is limited by knee pain but can walk any distance with the assistance of his cane.  He reports he gets short of breath after long walk, however this has been stable for years. BP has improved at recent office visits with other providers.    Home Medications    Prior to Admission medications   Medication Sig Start Date End Date Taking? Authorizing Provider  acetaminophen (TYLENOL 8 HOUR) 650 MG CR tablet Take 1 tablet (650 mg total) by mouth every 8 (eight) hours as needed for pain. 12/15/21   Leath-Warren, Alda Lea, NP  amLODipine (NORVASC) 10 MG tablet Take 1 tablet (10 mg total) by mouth daily. 11/19/21 11/14/22  Barrett, Evelene Croon, PA-C  aspirin 81 MG EC tablet Take 81 mg by mouth daily.    [provider]  calcitRIOL (ROCALTROL) 0.25 MCG capsule Take 0.25 mcg by mouth every Monday, Wednesday, and Friday. 07/09/20   [provider]  Coenzyme Q10-Vitamin E 100-150 MG-UNIT CAPS Take 1 capsule by mouth at bedtime.    [provider]  ezetimibe (ZETIA) 10 MG tablet TAKE (1) TABLET BY MOUTH ONCE DAILY. 07/17/21   Hilty, Nadean Corwin, MD  furosemide (LASIX) 20 MG tablet TAKE 1 TABLET BY MOUTH ONCE DAILY. 12/20/21   Satira Sark, MD  lisinopril (ZESTRIL) 10 MG  tablet Take 10 mg by mouth daily.    [provider]  metoprolol succinate (TOPROL-XL) 50 MG 24 hr tablet Take 50 mg by mouth daily. 10/07/21   [provider]  Misc Natural Products (OSTEO BI-FLEX ADV JOINT SHIELD) TABS Take 1 tablet by mouth daily.    [provider]  Multiple Vitamins-Minerals (CENTRUM PO) Take 1 tablet by mouth every morning.    [provider]  nitroGLYCERIN (NITROSTAT) 0.4 MG SL tablet PLACE 1 TAB UNDER TONGUE EVERY 5 MIN IF NEEDED FOR CHEST PAIN. MAY USE 3 TIMES.NO RELIEF CALL 911. Patient not taking: Reported on 04/01/2022 01/21/20   Erma Heritage, PA-C  pregabalin (LYRICA) 200 MG capsule Take 1 capsule (200 mg total) by mouth 2 (two) times daily. 01/06/22   Lindell Spar, MD  rosuvastatin (CRESTOR) 10 MG tablet TAKE (1) TABLET BY MOUTH ONCE DAILY. 02/24/22   Satira Sark, MD  sodium bicarbonate 650 MG tablet Take 650 mg by mouth 3 (three) times daily. 06/20/20   [provider]  traMADol Veatrice Bourbon) 50  MG tablet Take 1 tablet (50 mg total) by mouth every 12 (twelve) hours as needed. Patient not taking: Reported on 01/06/2022 12/15/21   Leath-Warren, Alda Lea, NP    Physical Exam    Vital Signs:  Kyle [place]. does not have vital signs available for review today.  Given telephonic nature of communication, physical exam is limited. AAOx3. NAD. Normal affect.  Speech and respirations are unlabored.  Accessory Clinical Findings    None  Assessment & Plan    1.  Preoperative Cardiovascular Risk Assessment: The patient is doing well from a cardiac perspective. Therefore, based on ACC/AHA guidelines, the patient would be at acceptable risk for the planned procedure without further cardiovascular testing. According to the Revised Cardiac Risk Index (RCRI), his Perioperative Risk of Major Cardiac Event is (%): 6.6. His Functional Capacity in METs is: 5.62 according to the Duke Activity Status Index (DASI).  The patient  was advised that if he develops new symptoms prior to surgery to contact our office to arrange for a follow-up visit, and he verbalized understanding.  He may hold aspirin for 5-7 days prior to procedure and should resume as soon as hemodynamically stable following the procedure.   A copy of this note will be routed to requesting surgeon.  Time:   Today, I have spent 10 minutes with the patient with telehealth technology discussing medical history, symptoms, and management plan.    Emmaline Life, NP-C  04/04/2022, 8:55 AM [address] Office [phone] Fax [phone]

## 2022-04-04 ENCOUNTER — Encounter: Payer: Self-pay | Admitting: Nurse Practitioner

## 2022-04-04 ENCOUNTER — Ambulatory Visit: Payer: Medicare Other | Attending: Interventional Cardiology | Admitting: Nurse Practitioner

## 2022-04-04 ENCOUNTER — Telehealth: Payer: Self-pay | Admitting: Nurse Practitioner

## 2022-04-04 DIAGNOSIS — Z01810 Encounter for preprocedural cardiovascular examination: Secondary | ICD-10-CM

## 2022-04-04 NOTE — Telephone Encounter (Signed)
Had virtual visit with patient for preop clearance for knee surgery. He asked when he is due to return to see Dr. Domenic Polite and I advised recall is for May. He requests to see him sooner. No specific concerns and does not need to be prior to knee surgery. Please contact patient to schedule.  Thank you, Sharyn Lull

## 2022-05-01 ENCOUNTER — Ambulatory Visit: Payer: Self-pay | Admitting: Physician Assistant

## 2022-05-01 DIAGNOSIS — G8929 Other chronic pain: Secondary | ICD-10-CM

## 2022-05-01 NOTE — H&P (View-Only) (Signed)
TOTAL KNEE ADMISSION H&P  Patient is being admitted for right total knee arthroplasty.  Subjective:  Chief Complaint:right knee pain.  HPI: Kyle Patel., 76 y.o. male, has a history of pain and functional disability in the right knee due to arthritis and has failed non-surgical conservative treatments for greater than 12 weeks to includeNSAID's and/or analgesics, corticosteriod injections, viscosupplementation injections, use of assistive devices, and activity modification.  Onset of symptoms was gradual, starting >10 years ago with gradually worsening course since that time. The patient noted no past surgery on the right knee(s).  Patient currently rates pain in the right knee(s) at 8 out of 10 with activity. Patient has night pain, worsening of pain with activity and weight bearing, pain that interferes with activities of daily living, pain with passive range of motion, crepitus, and joint swelling.  Patient has evidence of periarticular osteophytes and joint space narrowing by imaging studies.  There is no active infection.  Patient Active Problem List   Diagnosis Date Noted  . Preop examination 02/21/2022  . Leg swelling 02/21/2022  . CAD (coronary artery disease) 09/02/2021  . Prediabetes 06/15/2020  . Hyperlipidemia 04/27/2017  . NSTEMI (non-ST elevated myocardial infarction) ([place]) 04/24/2017  . History of colonic polyps   . Diverticulosis of colon without hemorrhage   . Family history of colon cancer 06/11/2015  . Chronic foot pain 03/08/2015  . Idiopathic peripheral neuropathy 05/31/2014  . Lumbar radiculopathy 11/21/2013  . Pain in joint, shoulder region 07/21/2012  . Nocturnal muscle cramps 01/19/2012  . Hypertension 11/10/2011  . CKD (chronic kidney disease) 11/10/2011  . Osteoarthritis of knee 09/16/2011   Past Medical History:  Diagnosis Date  . Acute anterior wall MI ([place]) 04/24/2017  . Arthritis   . 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 CAD (coronary artery disease)    a. s/p  NSTEMI in 03/2017 with 100% distal LAD stenosis treated with POBA as stent could not be delivered  . COPD (chronic obstructive pulmonary disease) ([place])   . Essential hypertension   . 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 Hyperlipidemia   . Pulmonary embolism ([place]) 10/2011    Past Surgical History:  Procedure Laterality Date  . ABDOMINAL SURGERY     colon polyp removed 2007- sepsis, coma for 4 months per pt  . BIOPSY  12/24/2020   Procedure: BIOPSY;  Surgeon: Eloise Harman, DO;  [facility];  Service: Endoscopy;;  . CATARACT EXTRACTION W/PHACO Right 08/19/2021   Procedure: CATARACT EXTRACTION PHACO AND INTRAOCULAR LENS PLACEMENT (IOC);  Surgeon: Baruch Goldmann, MD;  Location: [facility];  Service: Ophthalmology;  Laterality: Right;  CDE: 7.02   . CATARACT EXTRACTION W/PHACO Left 09/30/2021   Procedure: CATARACT EXTRACTION PHACO AND INTRAOCULAR LENS PLACEMENT (IOC);  Surgeon: Baruch Goldmann, MD;  Location: [facility];  Service: Ophthalmology;  Laterality: Left;  CDE 4.61  . COLONOSCOPY  03/22/2010   RMR: 1. Normal rectum 2. Diminutive polyp at the mouth of the ileocecal valve  status post cold biopsy removal. Remaideer of the colonic mucosa and terminal ileum mucosa appeared normal.   . COLONOSCOPY N/A 06/25/2015   Procedure: COLONOSCOPY;  Surgeon: Daneil Dolin, MD;  [facility];  Service: Endoscopy;  Laterality: N/A;  200-rescheduled 1/30 per [place]   . COLONOSCOPY WITH PROPOFOL N/A 12/24/2020   Procedure: COLONOSCOPY WITH PROPOFOL;  Surgeon: Eloise Harman, DO;  [facility];  Service: Endoscopy;  Laterality: N/A;  ASA III / 8:00  .  CORONARY BALLOON ANGIOPLASTY N/A 04/24/2017   Procedure: CORONARY BALLOON ANGIOPLASTY;  Surgeon: Jettie Booze, MD;  Location: [place] [facility];  Service: Cardiovascular;  Laterality: N/A;  . LEFT HEART CATH AND CORONARY ANGIOGRAPHY N/A 04/24/2017   Procedure: LEFT HEART CATH AND CORONARY ANGIOGRAPHY;   Surgeon: Jettie Booze, MD;  Location: [place] [facility];  Service: Cardiovascular;  Laterality: N/A;  . LUMBAR LAMINECTOMY  06/02/2012  . LUMBAR LAMINECTOMY/DECOMPRESSION MICRODISCECTOMY  06/02/2012   Procedure: LUMBAR LAMINECTOMY/DECOMPRESSION MICRODISCECTOMY 1 LEVEL;  Surgeon: Floyce Stakes, MD;  Location: [place] [facility];  Service: Neurosurgery;  Laterality: Bilateral;  Bilateral Lumbar five-sacral one Foraminotomy and microdiskectomy   . POLYPECTOMY  12/24/2020   Procedure: POLYPECTOMY;  Surgeon: Eloise Harman, DO;  [facility];  Service: Endoscopy;;    Current Outpatient Medications  Medication Sig Dispense Refill Last Dose  . acetaminophen (TYLENOL 8 HOUR) 650 MG CR tablet Take 1 tablet (650 mg total) by mouth every 8 (eight) hours as needed for pain. 30 tablet 0   . amLODipine (NORVASC) 10 MG tablet Take 1 tablet (10 mg total) by mouth daily. 90 tablet 3   . aspirin 81 MG EC tablet Take 81 mg by mouth daily.     . calcitRIOL (ROCALTROL) 0.25 MCG capsule Take 0.25 mcg by mouth every Monday, Wednesday, and Friday.     . Coenzyme Q10-Vitamin E 100-150 MG-UNIT CAPS Take 1 capsule by mouth at bedtime.     Marland Kitchen ezetimibe (ZETIA) 10 MG tablet TAKE (1) TABLET BY MOUTH ONCE DAILY. 90 tablet 3   . furosemide (LASIX) 20 MG tablet TAKE 1 TABLET BY MOUTH ONCE DAILY. 90 tablet 2   . lisinopril (ZESTRIL) 10 MG tablet Take 10 mg by mouth daily.     . metoprolol succinate (TOPROL-XL) 50 MG 24 hr tablet Take 50 mg by mouth daily.     . Misc Natural Products (OSTEO BI-FLEX ADV JOINT SHIELD) TABS Take 1 tablet by mouth daily.     . Multiple Vitamins-Minerals (CENTRUM PO) Take 1 tablet by mouth every morning.     . nitroGLYCERIN (NITROSTAT) 0.4 MG SL tablet PLACE 1 TAB UNDER TONGUE EVERY 5 MIN IF NEEDED FOR CHEST PAIN. MAY USE 3 TIMES.NO RELIEF CALL 911. 25 tablet 3   . pregabalin (LYRICA) 200 MG capsule Take 1 capsule (200 mg total) by mouth 2 (two) times daily. 60 capsule 5   .  rosuvastatin (CRESTOR) 10 MG tablet TAKE (1) TABLET BY MOUTH ONCE DAILY. 90 tablet 1   . sodium bicarbonate 650 MG tablet Take 650 mg by mouth 3 (three) times daily.      No current facility-administered medications for this visit.   Allergies  Allergen Reactions  . Levaquin [Levofloxacin] Itching  . Statins     Myalgias    Social History   Tobacco Use  . Smoking status: Former    Packs/day: 0.10    Years: 20.00    Total pack years: 2.00    Types: Cigarettes    Quit date: 05/08/1999    Years since quitting: 22.9  . Smokeless tobacco: Never  Substance Use Topics  . Alcohol use: No    Family History  Problem Relation Age of Onset  . Heart attack Mother   . Diabetes Mother   . Stroke Father   . Heart attack Father   . Arthritis Other   . Colon cancer Sister 72       Estimated age of onset; just  found out about it in 2015.  Marland Kitchen Neuropathy Neg Hx      Review of Systems  Cardiovascular:  Positive for leg swelling.  Musculoskeletal:  Positive for arthralgias.  All other systems reviewed and are negative.  Objective:  Physical Exam Constitutional:      General: He is not in acute distress.    Appearance: Normal appearance.  HENT:     Head: Normocephalic and atraumatic.  Eyes:     Extraocular Movements: Extraocular movements intact.     Pupils: Pupils are equal, round, and reactive to light.  Cardiovascular:     Rate and Rhythm: Normal rate and regular rhythm.     Pulses: Normal pulses.     Heart sounds: Normal heart sounds.  Pulmonary:     Effort: Pulmonary effort is normal. No respiratory distress.     Breath sounds: Normal breath sounds. No wheezing.  Abdominal:     General: Abdomen is flat. Bowel sounds are normal. There is no distension.     Palpations: Abdomen is soft.     Tenderness: There is no abdominal tenderness.  Musculoskeletal:     Cervical back: Normal range of motion and neck supple.     Comments: He is seated in the examination room. Alert and  oriented.  He appears the appropriate age. On he is neurovascularly intact.  Intact dorsiflexion and plantarflexion at the ankle there is a valgus inclination of approximately 10 degrees which is correctable. He has diffuse tenderness, more laterally with the knee. Full extension with flexion past 90 degrees actively.   Lymphadenopathy:     Cervical: No cervical adenopathy.  Skin:    General: Skin is warm and dry.     Findings: No erythema or rash.  Neurological:     General: No focal deficit present.     Mental Status: He is alert and oriented to person, place, and time.  Psychiatric:        Mood and Affect: Mood normal.        Behavior: Behavior normal.   Vital signs in last 24 hours: '@VSRANGES'$ @  Labs:   Estimated body mass index is 30.86 kg/m as calculated from the following:   Height as of 02/21/22: 5' 9.5" (1.765 m).   Weight as of 02/21/22: 96.2 kg.   Imaging Review Plain radiographs demonstrate moderate degenerative joint disease of the right knee(s). The overall alignment ismild valgus. The bone quality appears to be good for age and reported activity level.      Assessment/Plan:  End stage arthritis, right knee   The patient history, physical examination, clinical judgment of the provider and imaging studies are consistent with end stage degenerative joint disease of the right knee(s) and total knee arthroplasty is deemed medically necessary. The treatment options including medical management, injection therapy arthroscopy and arthroplasty were discussed at length. The risks and benefits of total knee arthroplasty were presented and reviewed. The risks due to aseptic loosening, infection, stiffness, patella tracking problems, thromboembolic complications and other imponderables were discussed. The patient acknowledged the explanation, agreed to proceed with the plan and consent was signed. Patient is being admitted for inpatient treatment for surgery, pain control, PT, OT,  prophylactic antibiotics, VTE prophylaxis, progressive ambulation and ADL's and discharge planning. The patient is planning to be discharged home with home health services    Anticipated LOS equal to or greater than 2 midnights due to - Age 62 and older with one or more of the following:  - Obesity  -  Expected need for hospital services (PT, OT, Nursing) required for safe  discharge  - Anticipated need for postoperative skilled nursing care or inpatient rehab  - Active co-morbidities: Diabetes, Coronary Artery Disease, and Respiratory Failure/COPD OR   - Unanticipated findings during/Post Surgery: None  - Patient is a high risk of re-admission due to: None

## 2022-05-01 NOTE — H&P (Signed)
TOTAL KNEE ADMISSION H&P  Patient is being admitted for right total knee arthroplasty.  Subjective:  Chief Complaint:right knee pain.  HPI: Kyle Patel., 76 y.o. male, has a history of pain and functional disability in the right knee due to arthritis and has failed non-surgical conservative treatments for greater than 12 weeks to includeNSAID's and/or analgesics, corticosteriod injections, viscosupplementation injections, use of assistive devices, and activity modification.  Onset of symptoms was gradual, starting >10 years ago with gradually worsening course since that time. The patient noted no past surgery on the right knee(s).  Patient currently rates pain in the right knee(s) at 8 out of 10 with activity. Patient has night pain, worsening of pain with activity and weight bearing, pain that interferes with activities of daily living, pain with passive range of motion, crepitus, and joint swelling.  Patient has evidence of periarticular osteophytes and joint space narrowing by imaging studies.  There is no active infection.  Patient Active Problem List   Diagnosis Date Noted  . Preop examination 02/21/2022  . Leg swelling 02/21/2022  . CAD (coronary artery disease) 09/02/2021  . Prediabetes 06/15/2020  . Hyperlipidemia 04/27/2017  . NSTEMI (non-ST elevated myocardial infarction) ([place] Patel) 04/24/2017  . History of colonic polyps   . Diverticulosis of colon without hemorrhage   . Family history of colon cancer 06/11/2015  . Chronic foot pain 03/08/2015  . Idiopathic peripheral neuropathy 05/31/2014  . Lumbar radiculopathy 11/21/2013  . Pain in joint, shoulder region 07/21/2012  . Nocturnal muscle cramps 01/19/2012  . Hypertension 11/10/2011  . CKD (chronic kidney disease) 11/10/2011  . Osteoarthritis of knee 09/16/2011   Past Medical History:  Diagnosis Date  . Acute anterior wall MI ([place]) 04/24/2017  . Arthritis   . 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 CAD (coronary artery disease)    a. s/p  NSTEMI in 03/2017 with 100% distal LAD stenosis treated with POBA as stent could not be delivered  . COPD (chronic obstructive pulmonary disease) ([place])   . Essential hypertension   . 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 Hyperlipidemia   . Pulmonary embolism ([place]) 10/2011    Past Surgical History:  Procedure Laterality Date  . ABDOMINAL SURGERY     colon polyp removed 2007- sepsis, coma for 4 months per pt  . BIOPSY  12/24/2020   Procedure: BIOPSY;  Surgeon: Eloise Harman, DO;  [facility];  Service: Endoscopy;;  . CATARACT EXTRACTION W/PHACO Right 08/19/2021   Procedure: CATARACT EXTRACTION PHACO AND INTRAOCULAR LENS PLACEMENT (IOC);  Surgeon: Baruch Goldmann, MD;  [facility];  Service: Ophthalmology;  Laterality: Right;  CDE: 7.02   . CATARACT EXTRACTION W/PHACO Left 09/30/2021   Procedure: CATARACT EXTRACTION PHACO AND INTRAOCULAR LENS PLACEMENT (IOC);  Surgeon: Baruch Goldmann, MD;  [facility];  Service: Ophthalmology;  Laterality: Left;  CDE 4.61  . COLONOSCOPY  03/22/2010   RMR: 1. Normal rectum 2. Diminutive polyp at the mouth of the ileocecal valve  status post cold biopsy removal. Remaideer of the colonic mucosa and terminal ileum mucosa appeared normal.   . COLONOSCOPY N/A 06/25/2015   Procedure: COLONOSCOPY;  Surgeon: Daneil Dolin, MD;  [facility];  Service: Endoscopy;  Laterality: N/A;  200-rescheduled 1/30 per [place]   . COLONOSCOPY WITH PROPOFOL N/A 12/24/2020   Procedure: COLONOSCOPY WITH PROPOFOL;  Surgeon: Eloise Harman, DO;  [facility];  Service: Endoscopy;  Laterality: N/A;  ASA III / 8:00  .  CORONARY BALLOON ANGIOPLASTY N/A 04/24/2017   Procedure: CORONARY BALLOON ANGIOPLASTY;  Surgeon: Jettie Booze, MD;  Location: [place] [facility];  Service: Cardiovascular;  Laterality: N/A;  . LEFT HEART CATH AND CORONARY ANGIOGRAPHY N/A 04/24/2017   Procedure: LEFT HEART CATH AND CORONARY ANGIOGRAPHY;   Surgeon: Jettie Booze, MD;  Location: [place] [facility];  Service: Cardiovascular;  Laterality: N/A;  . LUMBAR LAMINECTOMY  06/02/2012  . LUMBAR LAMINECTOMY/DECOMPRESSION MICRODISCECTOMY  06/02/2012   Procedure: LUMBAR LAMINECTOMY/DECOMPRESSION MICRODISCECTOMY 1 LEVEL;  Surgeon: Floyce Stakes, MD;  Location: [place] [facility];  Service: Neurosurgery;  Laterality: Bilateral;  Bilateral Lumbar five-sacral one Foraminotomy and microdiskectomy   . POLYPECTOMY  12/24/2020   Procedure: POLYPECTOMY;  Surgeon: Eloise Harman, DO;  [facility];  Service: Endoscopy;;    Current Outpatient Medications  Medication Sig Dispense Refill Last Dose  . acetaminophen (TYLENOL 8 HOUR) 650 MG CR tablet Take 1 tablet (650 mg total) by mouth every 8 (eight) hours as needed for pain. 30 tablet 0   . amLODipine (NORVASC) 10 MG tablet Take 1 tablet (10 mg total) by mouth daily. 90 tablet 3   . aspirin 81 MG EC tablet Take 81 mg by mouth daily.     . calcitRIOL (ROCALTROL) 0.25 MCG capsule Take 0.25 mcg by mouth every Monday, Wednesday, and Friday.     . Coenzyme Q10-Vitamin E 100-150 MG-UNIT CAPS Take 1 capsule by mouth at bedtime.     Marland Kitchen ezetimibe (ZETIA) 10 MG tablet TAKE (1) TABLET BY MOUTH ONCE DAILY. 90 tablet 3   . furosemide (LASIX) 20 MG tablet TAKE 1 TABLET BY MOUTH ONCE DAILY. 90 tablet 2   . lisinopril (ZESTRIL) 10 MG tablet Take 10 mg by mouth daily.     . metoprolol succinate (TOPROL-XL) 50 MG 24 hr tablet Take 50 mg by mouth daily.     . Misc Natural Products (OSTEO BI-FLEX ADV JOINT SHIELD) TABS Take 1 tablet by mouth daily.     . Multiple Vitamins-Minerals (CENTRUM PO) Take 1 tablet by mouth every morning.     . nitroGLYCERIN (NITROSTAT) 0.4 MG SL tablet PLACE 1 TAB UNDER TONGUE EVERY 5 MIN IF NEEDED FOR CHEST PAIN. MAY USE 3 TIMES.NO RELIEF CALL 911. 25 tablet 3   . pregabalin (LYRICA) 200 MG capsule Take 1 capsule (200 mg total) by mouth 2 (two) times daily. 60 capsule 5   .  rosuvastatin (CRESTOR) 10 MG tablet TAKE (1) TABLET BY MOUTH ONCE DAILY. 90 tablet 1   . sodium bicarbonate 650 MG tablet Take 650 mg by mouth 3 (three) times daily.      No current facility-administered medications for this visit.   Allergies  Allergen Reactions  . Levaquin [Levofloxacin] Itching  . Statins     Myalgias    Social History   Tobacco Use  . Smoking status: Former    Packs/day: 0.10    Years: 20.00    Total pack years: 2.00    Types: Cigarettes    Quit date: 05/08/1999    Years since quitting: 22.9  . Smokeless tobacco: Never  Substance Use Topics  . Alcohol use: No    Family History  Problem Relation Age of Onset  . Heart attack Mother   . Diabetes Mother   . Stroke Father   . Heart attack Father   . Arthritis Other   . Colon cancer Sister 19       Estimated age of onset; just  found out about it in 2015.  Marland Kitchen Neuropathy Neg Hx      Review of Systems  Cardiovascular:  Positive for leg swelling.  Musculoskeletal:  Positive for arthralgias.  All other systems reviewed and are negative.  Objective:  Physical Exam Constitutional:      General: He is not in acute distress.    Appearance: Normal appearance.  HENT:     Head: Normocephalic and atraumatic.  Eyes:     Extraocular Movements: Extraocular movements intact.     Pupils: Pupils are equal, round, and reactive to light.  Cardiovascular:     Rate and Rhythm: Normal rate and regular rhythm.     Pulses: Normal pulses.     Heart sounds: Normal heart sounds.  Pulmonary:     Effort: Pulmonary effort is normal. No respiratory distress.     Breath sounds: Normal breath sounds. No wheezing.  Abdominal:     General: Abdomen is flat. Bowel sounds are normal. There is no distension.     Palpations: Abdomen is soft.     Tenderness: There is no abdominal tenderness.  Musculoskeletal:     Cervical back: Normal range of motion and neck supple.     Comments: He is seated in the examination room. Alert and  oriented.  He appears the appropriate age. On he is neurovascularly intact.  Intact dorsiflexion and plantarflexion at the ankle there is a valgus inclination of approximately 10 degrees which is correctable. He has diffuse tenderness, more laterally with the knee. Full extension with flexion past 90 degrees actively.   Lymphadenopathy:     Cervical: No cervical adenopathy.  Skin:    General: Skin is warm and dry.     Findings: No erythema or rash.  Neurological:     General: No focal deficit present.     Mental Status: He is alert and oriented to person, place, and time.  Psychiatric:        Mood and Affect: Mood normal.        Behavior: Behavior normal.   Vital signs in last 24 hours: '@VSRANGES'$ @  Labs:   Estimated body mass index is 30.86 kg/m as calculated from the following:   Height as of 02/21/22: 5' 9.5" (1.765 m).   Weight as of 02/21/22: 96.2 kg.   Imaging Review Plain radiographs demonstrate moderate degenerative joint disease of the right knee(s). The overall alignment ismild valgus. The bone quality appears to be good for age and reported activity level.      Assessment/Plan:  End stage arthritis, right knee   The patient history, physical examination, clinical judgment of the provider and imaging studies are consistent with end stage degenerative joint disease of the right knee(s) and total knee arthroplasty is deemed medically necessary. The treatment options including medical management, injection therapy arthroscopy and arthroplasty were discussed at length. The risks and benefits of total knee arthroplasty were presented and reviewed. The risks due to aseptic loosening, infection, stiffness, patella tracking problems, thromboembolic complications and other imponderables were discussed. The patient acknowledged the explanation, agreed to proceed with the plan and consent was signed. Patient is being admitted for inpatient treatment for surgery, pain control, PT, OT,  prophylactic antibiotics, VTE prophylaxis, progressive ambulation and ADL's and discharge planning. The patient is planning to be discharged home with home health services    Anticipated LOS equal to or greater than 2 midnights due to - Age 80 and older with one or more of the following:  - Obesity  -  Expected need for hospital services (PT, OT, Nursing) required for safe  discharge  - Anticipated need for postoperative skilled nursing care or inpatient rehab  - Active co-morbidities: Diabetes, Coronary Artery Disease, and Respiratory Failure/COPD OR   - Unanticipated findings during/Post Surgery: None  - Patient is a high risk of re-admission due to: None

## 2022-05-01 NOTE — Progress Notes (Signed)
Sent message, via epic in basket, requesting orders in epic from surgeon.  

## 2022-05-06 NOTE — Progress Notes (Addendum)
Anesthesia Review:  PCP: Ihor Dow- [facility] 02/21/22  Cardiologist : Christen Bame, NP [place] 04/14/22- cardiac clearance  Nephrology- DR Theador Hawthorne- [place] 04/04/22  Chest x-ray : EKG : 10/22/21  Echo : 2021  Stress test: 08/03/20  Cardiac Cath  2018   Activity level:  Sleep Study/ CPAP : Fasting Blood Sugar :      / Checks Blood Sugar -- times a day:   Blood Thinner/ Instructions /Last Dose: ASA / Instructions/ Last Dose :

## 2022-05-07 NOTE — Patient Instructions (Signed)
SURGICAL WAITING ROOM VISITATION Patients having surgery or a procedure may have no more than 2 support people in the waiting area - these visitors may rotate.   Children under the age of 35 must have an adult with them who is not the patient. If the patient needs to stay at the hospital during part of their recovery, the visitor guidelines for inpatient rooms apply. Pre-op nurse will coordinate an appropriate time for 1 support person to accompany patient in pre-op.  This support person may not rotate.    Please refer to the [facility] website for the visitor guidelines for Inpatients (after your surgery is over and you are in a regular room).       Your procedure is scheduled on:  05/16/22    Report to [facility] Main Entrance    Report to admitting at  1100 AM   Call this number if you have problems the morning of surgery [phone]   Do not eat food :After Midnight.   After Midnight you may have the following liquids until __ 1030____ A m  DAY OF SURGERY  Water Non-Citrus Juices (without pulp, NO RED) Carbonated Beverages Black Coffee (NO MILK/CREAM OR CREAMERS, sugar ok)  Clear Tea (NO MILK/CREAM OR CREAMERS, sugar ok) regular and decaf                             Plain Jell-O (NO RED)                                           Fruit ices (not with fruit pulp, NO RED)                                     Popsicles (NO RED)                                                               Sports drinks like Gatorade (NO RED)                     The day of surgery:  Drink ONE (1) Pre-Surgery Clear Ensure or G2 at   ( have completed by )  the morning of surgery. Drink in one sitting. Do not sip.  This drink was given to you during your hospital  pre-op appointment visit. Nothing else to drink after completing the  Pre-Surgery Clear Ensure or G2.          If you have questions, please contact your surgeon's office.        Oral Hygiene is also important to  reduce your risk of infection.                                    Remember - BRUSH YOUR TEETH THE MORNING OF SURGERY WITH YOUR REGULAR TOOTHPASTE  DENTURES WILL BE REMOVED PRIOR TO SURGERY PLEASE DO NOT APPLY "Poly grip" OR ADHESIVES!!!   Do NOT smoke after Midnight  Take these medicines the morning of surgery with A SIP OF WATER:  amlodpine, toprol, lyrica, sodium bicarb   DO NOT TAKE ANY ORAL DIABETIC MEDICATIONS DAY OF YOUR SURGERY  Bring CPAP mask and tubing day of surgery.                              You may not have any metal on your body including hair pins, jewelry, and body piercing             Do not wear make-up, lotions, powders, perfumes/cologne, or deodorant  Do not wear nail polish including gel and S&S, artificial/acrylic nails, or any other type of covering on natural nails including finger and toenails. If you have artificial nails, gel coating, etc. that needs to be removed by a nail salon please have this removed prior to surgery or surgery may need to be canceled/ delayed if the surgeon/ anesthesia feels like they are unable to be safely monitored.   Do not shave  48 hours prior to surgery.               Men may shave face and neck.   Do not bring valuables to the hospital. [place].   Contacts, glasses, dentures or bridgework may not be worn into surgery.   Bring small overnight bag day of surgery.   DO NOT [place]. PHARMACY WILL DISPENSE MEDICATIONS LISTED ON YOUR MEDICATION LIST TO YOU DURING YOUR ADMISSION [place]!    Patients discharged on the day of surgery will not be allowed to drive home.  Someone NEEDS to stay with you for the first 24 hours after anesthesia.   Special Instructions: Bring a copy of your healthcare power of attorney and living will documents the day of surgery if you haven't scanned them before.              Please read over the following  fact sheets you were given: IF [place] [phone]   If you received a COVID test during your pre-op visit  it is requested that you wear a mask when out in public, stay away from anyone that may not be feeling well and notify your surgeon if you develop symptoms. If you test positive for Covid or have been in contact with anyone that has tested positive in the last 10 days please notify you surgeon.     - Preparing for Surgery Before surgery, you can play an important role.  Because skin is not sterile, your skin needs to be as free of germs as possible.  You can reduce the number of germs on your skin by washing with CHG (chlorahexidine gluconate) soap before surgery.  CHG is an antiseptic cleaner which kills germs and bonds with the skin to continue killing germs even after washing. Please DO NOT use if you have an allergy to CHG or antibacterial soaps.  If your skin becomes reddened/irritated stop using the CHG and inform your nurse when you arrive at Short Stay. Do not shave (including legs and underarms) for at least 48 hours prior to the first CHG shower.  You may shave your face/neck. Please follow these instructions carefully:  1.  Shower with CHG Soap the night before surgery and the  morning of Surgery.  2.  If you choose to wash your hair, wash your hair first as usual with your  normal  shampoo.  3.  After you shampoo, rinse your hair and body thoroughly to remove the  shampoo.                           4.  Use CHG as you would any other liquid soap.  You can apply chg directly  to the skin and wash                       Gently with a scrungie or clean washcloth.  5.  Apply the CHG Soap to your body ONLY FROM THE NECK DOWN.   Do not use on face/ open                           Wound or open sores. Avoid contact with eyes, ears mouth and genitals (private parts).                       Wash face,  Genitals (private parts)  with your normal soap.             6.  Wash thoroughly, paying special attention to the area where your surgery  will be performed.  7.  Thoroughly rinse your body with warm water from the neck down.  8.  DO NOT shower/wash with your normal soap after using and rinsing off  the CHG Soap.                9.  Pat yourself dry with a clean towel.            10.  Wear clean pajamas.            11.  Place clean sheets on your bed the night of your first shower and do not  sleep with pets. Day of Surgery : Do not apply any lotions/deodorants the morning of surgery.  Please wear clean clothes to the hospital/surgery center.  FAILURE TO FOLLOW THESE INSTRUCTIONS MAY RESULT IN THE CANCELLATION OF YOUR SURGERY PATIENT SIGNATURE_________________________________  NURSE SIGNATURE__________________________________  ________________________________________________________________________

## 2022-05-08 ENCOUNTER — Other Ambulatory Visit: Payer: Self-pay

## 2022-05-08 ENCOUNTER — Encounter (HOSPITAL_COMMUNITY): Payer: Self-pay

## 2022-05-08 ENCOUNTER — Encounter (HOSPITAL_COMMUNITY)
Admission: RE | Admit: 2022-05-08 | Discharge: 2022-05-08 | Disposition: A | Discharge status: Home / Self Care | Payer: Medicare Other | Source: Ambulatory Visit | Attending: Orthopedic Surgery | Admitting: Orthopedic Surgery

## 2022-05-08 VITALS — BP 136/73 | HR 65 | Temp 98.2°F | Resp 16 | Ht 68.0 in | Wt 195.0 lb

## 2022-05-08 DIAGNOSIS — Z01818 Encounter for other preprocedural examination: Secondary | ICD-10-CM | POA: Insufficient documentation

## 2022-05-08 DIAGNOSIS — G8929 Other chronic pain: Secondary | ICD-10-CM | POA: Diagnosis not present

## 2022-05-08 DIAGNOSIS — I4510 Unspecified right bundle-branch block: Secondary | ICD-10-CM | POA: Insufficient documentation

## 2022-05-08 DIAGNOSIS — M25561 Pain in right knee: Secondary | ICD-10-CM | POA: Insufficient documentation

## 2022-05-08 HISTORY — DX: Chronic kidney disease, unspecified: N18.9

## 2022-05-08 LAB — SURGICAL PCR SCREEN
MRSA, PCR: NEGATIVE
Staphylococcus aureus: NEGATIVE

## 2022-05-08 LAB — CBC WITH DIFFERENTIAL/PLATELET
Abs Immature Granulocytes: 0.02 10*3/uL (ref 0.00–0.07)
Basophils Absolute: 0 10*3/uL (ref 0.0–0.1)
Basophils Relative: 0 %
Eosinophils Absolute: 0.2 10*3/uL (ref 0.0–0.5)
Eosinophils Relative: 2 %
HCT: 44.6 % (ref 39.0–52.0)
Hemoglobin: 14 g/dL (ref 13.0–17.0)
Immature Granulocytes: 0 %
Lymphocytes Relative: 22 %
Lymphs Abs: 1.9 10*3/uL (ref 0.7–4.0)
MCH: 29.4 pg (ref 26.0–34.0)
MCHC: 31.4 g/dL (ref 30.0–36.0)
MCV: 93.7 fL (ref 80.0–100.0)
Monocytes Absolute: 1 10*3/uL (ref 0.1–1.0)
Monocytes Relative: 12 %
Neutro Abs: 5.6 10*3/uL (ref 1.7–7.7)
Neutrophils Relative %: 64 %
Platelets: 193 10*3/uL (ref 150–400)
RBC: 4.76 MIL/uL (ref 4.22–5.81)
RDW: 14.6 % (ref 11.5–15.5)
WBC: 8.7 10*3/uL (ref 4.0–10.5)
nRBC: 0 % (ref 0.0–0.2)

## 2022-05-08 LAB — COMPREHENSIVE METABOLIC PANEL
ALT: 16 U/L (ref 0–44)
AST: 30 U/L (ref 15–41)
Albumin: 4 g/dL (ref 3.5–5.0)
Alkaline Phosphatase: 54 U/L (ref 38–126)
Anion gap: 8 (ref 5–15)
BUN: 34 mg/dL — ABNORMAL HIGH (ref 8–23)
CO2: 22 mmol/L (ref 22–32)
Calcium: 9.2 mg/dL (ref 8.9–10.3)
Chloride: 112 mmol/L — ABNORMAL HIGH (ref 98–111)
Creatinine, Ser: 2.1 mg/dL — ABNORMAL HIGH (ref 0.61–1.24)
GFR, Estimated: 32 mL/min — ABNORMAL LOW (ref >60)
Glucose, Bld: 99 mg/dL (ref 70–99)
Potassium: 4.6 mmol/L (ref 3.5–5.1)
Sodium: 142 mmol/L (ref 135–145)
Total Bilirubin: 0.6 mg/dL (ref 0.3–1.2)
Total Protein: 8.2 g/dL — ABNORMAL HIGH (ref 6.5–8.1)

## 2022-05-08 NOTE — Progress Notes (Deleted)
Vital signs on 05/08/22 at   1327 pm      time where entered in error.

## 2022-05-09 NOTE — Progress Notes (Signed)
Anesthesia Chart Review   Case: [id number] Date/Time: 05/16/22 1018   Procedure: TOTAL KNEE ARTHROPLASTY (Right: Knee)   Anesthesia type: Choice   Pre-op diagnosis: OA RIGHT KNEE   Location: [facility] ROOM 07 / [facility]   Surgeons: Earlie Server, MD       DISCUSSION:76 y.o. former smoker with h/o HTN, COPD, CAD NSTEMI 2018 with PTCA dLAD, CKD Stage III, right knee OA scheduled for above procedure 05/16/2022 with Dr. Earlie Server.   Pt followed by nephrology. Last OV 04/04/2022.  Creatinine trend 2022 1.7-- 2.3 2021 1.9--2.2 1.6 baseline==> 1.9--2.2 with a peak creatinine of 3.2   Pt seen by cardiology 04/04/2022. Per OV note, " Preoperative Cardiovascular Risk Assessment: The patient is doing well from a cardiac perspective. Therefore, based on ACC/AHA guidelines, the patient would be at acceptable risk for the planned procedure without further cardiovascular testing. According to the Revised Cardiac Risk Index (RCRI), his Perioperative Risk of Major Cardiac Event is (%): 6.6. His Functional Capacity in METs is: 5.62 according to the Duke Activity Status Index (DASI).   The patient was advised that if he develops new symptoms prior to surgery to contact our office to arrange for a follow-up visit, and he verbalized understanding.   He may hold aspirin for 5-7 days prior to procedure and should resume as soon as hemodynamically stable following the procedure. "  Anticipate pt can proceed with planned procedure barring acute status change.   VS: BP 136/73   Pulse 65   Temp 36.8 C (Oral)   Resp 16   Ht '5\' 8"'$  (1.727 m)   Wt 88.5 kg   SpO2 100%   BMI 29.65 kg/m   PROVIDERS: Lindell Spar, MD is PCP   Rozann Lesches, MD is Cardiologist  LABS: Labs reviewed: Acceptable for surgery. (all labs ordered are listed, but only abnormal results are displayed)  Labs Reviewed  COMPREHENSIVE METABOLIC PANEL - Abnormal; Notable for the following components:      Result Value   Chloride 112  (*)    BUN 34 (*)    Creatinine, Ser 2.10 (*)    Total Protein 8.2 (*)    GFR, Estimated 32 (*)    All other components within normal limits  SURGICAL PCR SCREEN  CBC WITH DIFFERENTIAL/PLATELET  TYPE AND SCREEN     IMAGES:   EKG:   CV: Myocardial Perfusion 08/03/2020 No diagnostic ST segment changes to indicate ischemia. Small, moderate intensity, apical and inferior apical defect that is fixed and most consistent with scar. There is also diaphragmatic attenuation noted. This is a low risk study. Nuclear stress EF: 58%.  Echo 01/23/2020 1. Left ventricular ejection fraction, by estimation, is 60 to 65%. The  left ventricle has normal function. The left ventricle has no regional  wall motion abnormalities. Left ventricular diastolic parameters are  indeterminate.   2. Right ventricular systolic function is normal. The right ventricular  size is normal.   3. The mitral valve is normal in structure. Trivial mitral valve  regurgitation. No evidence of mitral stenosis.   4. The aortic valve has an indeterminant number of cusps. Aortic valve  regurgitation is not visualized. No aortic stenosis is present.   5. The inferior vena cava is normal in size with greater than 50%  respiratory variability, suggesting right atrial pressure of 3 mmHg.  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   Hyperlipidemia    Pulmonary embolism ([place]) 10/2011    Past Surgical History:  Procedure Laterality Date   ABDOMINAL SURGERY     colon polyp removed 2007- sepsis, coma for 4 months per pt   BIOPSY  12/24/2020    Procedure: BIOPSY;  Surgeon: Eloise Harman, DO;  [facility];  Service: Endoscopy;;   CATARACT EXTRACTION W/PHACO Right 08/19/2021   Procedure: CATARACT EXTRACTION PHACO AND INTRAOCULAR LENS PLACEMENT ([place]);  Surgeon: Baruch Goldmann, MD;  Location: [facility];  Service: Ophthalmology;  Laterality: Right;  CDE: 7.02    CATARACT EXTRACTION W/PHACO Left 09/30/2021   Procedure: CATARACT EXTRACTION PHACO AND INTRAOCULAR LENS PLACEMENT (I[facility]);  Surgeon: Baruch Goldmann, MD;  Location: [facility];  Service: Ophthalmology;  Laterality: Left;  CDE 4.61   COLONOSCOPY  03/22/2010   RMR: 1. Normal rectum 2. Diminutive polyp at the mouth of the ileocecal valve  status post cold biopsy removal. Remaideer of the colonic mucosa and terminal ileum mucosa appeared normal.    COLONOSCOPY N/A 06/25/2015   Procedure: COLONOSCOPY;  Surgeon: Daneil Dolin, MD;  [facility];  Service: Endoscopy;  Laterality: N/A;  200-rescheduled 1/30 per [place]    COLONOSCOPY WITH PROPOFOL N/A 12/24/2020   Procedure: COLONOSCOPY WITH PROPOFOL;  Surgeon: Eloise Harman, DO;  [facility];  Service: Endoscopy;  Laterality: N/A;  ASA III / 8:00   CORONARY BALLOON ANGIOPLASTY N/A 04/24/2017   Procedure: CORONARY BALLOON ANGIOPLASTY;  Surgeon: Jettie Booze, MD;  Location: [place] CV LAB;  Service: Cardiovascular;  Laterality: N/A;   LEFT HEART CATH AND CORONARY ANGIOGRAPHY N/A 04/24/2017   Procedure: LEFT HEART CATH AND CORONARY ANGIOGRAPHY;  Surgeon: Jettie Booze, MD;  Location: [place] CV LAB;  Service: Cardiovascular;  Laterality: N/A;   LUMBAR LAMINECTOMY  06/02/2012   LUMBAR LAMINECTOMY/DECOMPRESSION MICRODISCECTOMY  06/02/2012   Procedure: LUMBAR LAMINECTOMY/DECOMPRESSION MICRODISCECTOMY 1 LEVEL;  Surgeon: Floyce Stakes, MD;  Location: [place] [facility]URO ORS;  Service: Neurosurgery;  Laterality: Bilateral;  Bilateral Lumbar five-sacral one Foraminotomy and microdiskectomy    POLYPECTOMY   12/24/2020   Procedure: POLYPECTOMY;  Surgeon: Eloise Harman, DO;  [facility];  Service: Endoscopy;;    MEDICATIONS:  acetaminophen (TYLENOL 8 HOUR) 650 MG CR tablet   amLODipine (NORVASC) 10 MG tablet   aspirin 81 MG EC tablet   calcitRIOL (ROCALTROL) 0.25 MCG capsule   Coenzyme Q10-Vitamin E 100-150 MG-UNIT CAPS   ezetimibe (ZETIA) 10 MG tablet   furosemide (LASIX) 20 MG tablet   lisinopril (ZESTRIL) 10 MG tablet   metoprolol succinate (TOPROL-XL) 50 MG 24 hr tablet   Misc Natural Products (OSTEO BI-FLEX ADV JOINT SHIELD) TABS   Multiple Vitamins-Minerals (CENTRUM PO)   nitroGLYCERIN (NITROSTAT) 0.4 MG SL tablet   pregabalin (LYRICA) 200 MG capsule   rosuvastatin (CRESTOR) 10 MG tablet   sodium bicarbonate 650 MG tablet   No current facility-administered medications for this encounter.     Konrad Felix Ward, PA-C [facility]Surgical Testing [phone]

## 2022-05-09 NOTE — Anesthesia Preprocedure Evaluation (Addendum)
Anesthesia Evaluation  Patient identified by MRN, date of birth, ID band Patient awake    Reviewed: Allergy & Precautions, NPO status , Patient's Chart, lab work & pertinent test results  History of Anesthesia Complications Negative for: history of anesthetic complications  Airway Mallampati: II  TM Distance: >3 FB Neck ROM: Full    Dental  (+) Missing,    Pulmonary COPD, former smoker   Pulmonary exam normal        Cardiovascular hypertension, Pt. on medications + CAD and + Past MI (2018)  Normal cardiovascular exam     Neuro/Psych    GI/Hepatic   Endo/Other    Renal/GU Renal InsufficiencyRenal disease     Musculoskeletal  (+) Arthritis ,    Abdominal   Peds  Hematology   Anesthesia Other Findings   Reproductive/Obstetrics                              Anesthesia Physical Anesthesia Plan  ASA: 3  Anesthesia Plan: Spinal   Post-op Pain Management: Tylenol PO (pre-op)* and Regional block*   Induction:   PONV Risk Score and Plan: 2 and Treatment may vary due to age or medical condition, Ondansetron, Propofol infusion and Dexamethasone  Airway Management Planned: Natural Airway and Simple Face Mask  Additional Equipment: None  Intra-op Plan:   Post-operative Plan:   Informed Consent: I have reviewed the patients History and Physical, chart, labs and discussed the procedure including the risks, benefits and alternatives for the proposed anesthesia with the patient or authorized representative who has indicated his/her understanding and acceptance.       Plan Discussed with: CRNA  Anesthesia Plan Comments: (See PAT note 05/08/2022)        Anesthesia Quick Evaluation

## 2022-05-12 ENCOUNTER — Ambulatory Visit (INDEPENDENT_AMBULATORY_CARE_PROVIDER_SITE_OTHER): Payer: Medicare Other | Admitting: Internal Medicine

## 2022-05-12 ENCOUNTER — Encounter: Payer: Self-pay | Admitting: Internal Medicine

## 2022-05-12 VITALS — BP 122/68 | HR 69 | Ht 69.5 in | Wt 204.4 lb

## 2022-05-12 DIAGNOSIS — Z0001 Encounter for general adult medical examination with abnormal findings: Secondary | ICD-10-CM

## 2022-05-12 DIAGNOSIS — N1832 Chronic kidney disease, stage 3b: Secondary | ICD-10-CM | POA: Diagnosis not present

## 2022-05-12 DIAGNOSIS — M170 Bilateral primary osteoarthritis of knee: Secondary | ICD-10-CM

## 2022-05-12 DIAGNOSIS — Z23 Encounter for immunization: Secondary | ICD-10-CM | POA: Diagnosis not present

## 2022-05-12 DIAGNOSIS — I25110 Atherosclerotic heart disease of native coronary artery with unstable angina pectoris: Secondary | ICD-10-CM

## 2022-05-12 DIAGNOSIS — I10 Essential (primary) hypertension: Secondary | ICD-10-CM | POA: Diagnosis not present

## 2022-05-12 DIAGNOSIS — G609 Hereditary and idiopathic neuropathy, unspecified: Secondary | ICD-10-CM

## 2022-05-12 DIAGNOSIS — M7989 Other specified soft tissue disorders: Secondary | ICD-10-CM

## 2022-05-12 NOTE — Assessment & Plan Note (Signed)
Followed by nephrology - Dr. Theador Hawthorne, last visit note reviewed Recently had AKI on CKD On lisinopril and Farxiga for proteinuria On Lasix every other day On calcitriol and sodium bicarb

## 2022-05-12 NOTE — Assessment & Plan Note (Signed)
S/p stent placement On aspirin and statin On Metoprolol Denies chest pain or dyspnea currently

## 2022-05-12 NOTE — Patient Instructions (Signed)
Please continue to take medications as prescribed.  Please continue to follow low salt diet and ambulate as tolerated.  Please try to keep legs elevated for leg swelling. Okay to wear compression socks for it as well.  Please consider getting Shingrix and Tdap vaccine at local pharmacy.

## 2022-05-12 NOTE — Assessment & Plan Note (Signed)
Physical exam as documented. Recent blood tests reviewed from chart. Flu vaccine today. Advised to get Shingrix and Tdap vaccine at local pharmacy.

## 2022-05-12 NOTE — Assessment & Plan Note (Signed)
Advised to take Tylenol arthritis as needed for knee pain Avoid oral NSAIDs due to CKD Planned to get right TKA

## 2022-05-12 NOTE — Assessment & Plan Note (Signed)
BP Readings from Last 1 Encounters:  05/12/22 122/68   Well-controlled with lisinopril and amlodipine Counseled for compliance with the medications Advised DASH diet and moderate exercise/walking as tolerated

## 2022-05-12 NOTE — Progress Notes (Signed)
Established Patient Office Visit  Subjective:  Patient ID: Gwyn Mehring., male    DOB: Jul 24, 1945  Age: 76 y.o. MRN: [id number]  CC:  Chief Complaint  Patient presents with   Annual Exam    Patient states he has swelling in his left ankle and leg    HPI Advith Martine. is a 76 y.o. male with past medical history of CAD, HTN, CKD, peripheral neuropathy and HLD who presents for annual physical.  HTN and CAD: He has had stent placed for history of NSTEMI in 2018.  He is on aspirin and statin currently. BP is well-controlled. Takes medications regularly. Patient denies headache, dizziness, chest pain, dyspnea or palpitations.   CKD stage IIIb: Followed by Dr. Theador Hawthorne.  His last BMP showed GFR of 34, which was lower compared to his baseline.  He currently denies any dysuria, hematuria, urinary hesitance or resistance.  He was recently placed back on Farxiga for proteinuria.  He complains of chronic left leg swelling, which usually improves after lying down.  He has noticed resolution of leg swelling in the morning.  He is currently taking Lasix 20 mg QD.  Denies any dyspnea, orthopnea or PND.  He has history of OA of knee, and is going to get right TKA in this week.  He has history of uncontrolled peripheral neuropathy, for which he takes Lyrica 200 mg twice daily.  He has chronic burning pain in his feet and also complains of intermittent weakness of the LE.  Denies any recent fall.  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   Hyperlipidemia    Pulmonary embolism ([place]) 10/2011    Past Surgical History:   Procedure Laterality Date   ABDOMINAL SURGERY     colon polyp removed 2007- sepsis, coma for 4 months per pt   BIOPSY  12/24/2020   Procedure: BIOPSY;  Surgeon: Eloise Harman, DO;  [facility];  Service: Endoscopy;;   CATARACT EXTRACTION W/PHACO Right 08/19/2021   Procedure: CATARACT EXTRACTION PHACO AND INTRAOCULAR LENS PLACEMENT ([place]);  Surgeon: Baruch Goldmann, MD;  Location: [facility];  Service: Ophthalmology;  Laterality: Right;  CDE: 7.02    CATARACT EXTRACTION W/PHACO Left 09/30/2021   Procedure: CATARACT EXTRACTION PHACO AND INTRAOCULAR LENS PLACEMENT ([facility]);  Surgeon: Baruch Goldmann, MD;  Location: [facility];  Service: Ophthalmology;  Laterality: Left;  CDE 4.61   COLONOSCOPY  03/22/2010   RMR: 1. Normal rectum 2. Diminutive polyp at the mouth of the ileocecal valve  status post cold biopsy removal. Remaideer of the colonic mucosa and terminal ileum mucosa appeared normal.    COLONOSCOPY N/A 06/25/2015   Procedure: COLONOSCOPY;  Surgeon: Daneil Dolin, MD;  [facility];  Service: Endoscopy;  Laterality: N/A;  200-rescheduled 1/30 per [place]    COLONOSCOPY WITH PROPOFOL N/A 12/24/2020   Procedure: COLONOSCOPY WITH PROPOFOL;  Surgeon: Eloise Harman, DO;  [facility];  Service: Endoscopy;  Laterality: N/A;  ASA III / 8:00   CORONARY BALLOON ANGIOPLASTY N/A 04/24/2017   Procedure: CORONARY  BALLOON ANGIOPLASTY;  Surgeon: Jettie Booze, MD;  Location: [facility];  Service: Cardiovascular;  Laterality: N/A;   LEFT HEART CATH AND CORONARY ANGIOGRAPHY N/A 04/24/2017   Procedure: LEFT HEART CATH AND CORONARY ANGIOGRAPHY;  Surgeon: Jettie Booze, MD;  Location: [facility];  Service: Cardiovascular;  Laterality: N/A;   LUMBAR LAMINECTOMY  06/02/2012   LUMBAR LAMINECTOMY/DECOMPRESSION MICRODISCECTOMY  06/02/2012   Procedure: LUMBAR LAMINECTOMY/DECOMPRESSION MICRODISCECTOMY 1 LEVEL;  Surgeon: Floyce Stakes, MD;  Location: [facility];   Service: Neurosurgery;  Laterality: Bilateral;  Bilateral Lumbar five-sacral one Foraminotomy and microdiskectomy    POLYPECTOMY  12/24/2020   Procedure: POLYPECTOMY;  Surgeon: Eloise Harman, DO;  [facility];  Service: Endoscopy;;    Family History  Problem Relation Age of Onset   Heart attack Mother    Diabetes Mother    Stroke Father    Heart attack Father    Arthritis Other    Colon cancer Sister 69       Estimated age of onset; just found out about it in 2015.   Neuropathy Neg Hx     Social History   Socioeconomic History   Marital status: Married    Spouse name: Not on file   Number of children: 1   Years of education: Not on file   Highest education level: Not on file  Occupational History   Occupation: retired  Tobacco Use   Smoking status: Former    Packs/day: 0.10    Years: 20.00    Total pack years: 2.00    Types: Cigarettes    Quit date: 05/08/1999    Years since quitting: 23.0   Smokeless tobacco: Never  Vaping Use   Vaping Use: Never used  Substance and Sexual Activity   Alcohol use: No   Drug use: No   Sexual activity: Yes  Other Topics Concern   Not on file  Social History Narrative   Patient is right handed.   Patient drinks caffeine occasionally   Social Determinants of Health   Financial Resource Strain: Low Risk  (10/29/2021)   Overall Financial Resource Strain (CARDIA)    Difficulty of Paying Living Expenses: Not hard at all  Food Insecurity: No Food Insecurity (10/29/2021)   Hunger Vital Sign    Worried About Running Out of Food in the Last Year: Never true    Ran Out of Food in the Last Year: Never true  Transportation Needs: No Transportation Needs (10/29/2021)   PRAPARE - Hydrologist (Medical): No    Lack of Transportation (Non-Medical): No  Physical Activity: Inactive (10/29/2021)   Exercise Vital Sign    Days of Exercise per Week: 0 days    Minutes of Exercise per Session: 0 min  Stress: No  Stress Concern Present (10/29/2021)   [place]    Feeling of Stress : Not at all  Social Connections: [place] (10/29/2021)   Social Connection and Isolation Panel [NHANES]    Frequency of Communication with Friends and Family: More than three times a week    Frequency of Social Gatherings with Friends and Family: More than three times a week    Attends Religious Services: More than 4 times per year    Active Member of [employer] or Organizations: Yes    Attends Music therapist: More than 4 times per year    Marital Status: Married  Intimate  Partner Violence: Not At Risk (10/29/2021)   Humiliation, Afraid, Rape, and Kick questionnaire    Fear of Current or Ex-Partner: No    Emotionally Abused: No    Physically Abused: No    Sexually Abused: No    Outpatient Medications Prior to Visit  Medication Sig Dispense Refill   dapagliflozin propanediol (FARXIGA) 5 MG TABS tablet Take by mouth.     acetaminophen (TYLENOL 8 HOUR) 650 MG CR tablet Take 1 tablet (650 mg total) by mouth every 8 (eight) hours as needed for pain. 30 tablet 0   amLODipine (NORVASC) 10 MG tablet Take 1 tablet (10 mg total) by mouth daily. 90 tablet 3   aspirin 81 MG EC tablet Take 81 mg by mouth daily.     calcitRIOL (ROCALTROL) 0.25 MCG capsule Take 0.25 mcg by mouth every Monday, Wednesday, and Friday.     Coenzyme Q10-Vitamin E 100-150 MG-UNIT CAPS Take 1 capsule by mouth at bedtime.     ezetimibe (ZETIA) 10 MG tablet TAKE (1) TABLET BY MOUTH ONCE DAILY. 90 tablet 3   furosemide (LASIX) 20 MG tablet TAKE 1 TABLET BY MOUTH ONCE DAILY. 90 tablet 2   lisinopril (ZESTRIL) 10 MG tablet Take 10 mg by mouth daily.     metoprolol succinate (TOPROL-XL) 50 MG 24 hr tablet Take 50 mg by mouth daily.     Misc Natural Products (OSTEO BI-FLEX ADV JOINT SHIELD) TABS Take 1 tablet by mouth daily.     Multiple Vitamins-Minerals (CENTRUM PO) Take 1  tablet by mouth every morning.     nitroGLYCERIN (NITROSTAT) 0.4 MG SL tablet PLACE 1 TAB UNDER TONGUE EVERY 5 MIN IF NEEDED FOR CHEST PAIN. MAY USE 3 TIMES.NO RELIEF CALL 911. 25 tablet 3   pregabalin (LYRICA) 200 MG capsule Take 1 capsule (200 mg total) by mouth 2 (two) times daily. 60 capsule 5   rosuvastatin (CRESTOR) 10 MG tablet TAKE (1) TABLET BY MOUTH ONCE DAILY. 90 tablet 1   sodium bicarbonate 650 MG tablet Take 650 mg by mouth 3 (three) times daily.     No facility-administered medications prior to visit.    Allergies  Allergen Reactions   Levaquin [Levofloxacin] Itching   Statins     Myalgias    ROS Review of Systems  Constitutional:  Negative for chills and fever.  HENT:  Negative for congestion and sore throat.   Eyes:  Negative for pain and discharge.  Respiratory:  Negative for cough and shortness of breath.   Cardiovascular:  Positive for leg swelling. Negative for chest pain and palpitations.  Gastrointestinal:  Negative for diarrhea, nausea and vomiting.  Endocrine: Negative for polydipsia and polyuria.  Genitourinary:  Negative for dysuria and hematuria.  Musculoskeletal:  Positive for arthralgias and joint swelling. Negative for neck pain and neck stiffness.  Skin:  Negative for rash.  Neurological:  Positive for weakness and numbness. Negative for dizziness and headaches.  Psychiatric/Behavioral:  Negative for agitation and behavioral problems.       Objective:    Physical Exam Vitals reviewed.  Constitutional:      General: He is not in acute distress.    Appearance: He is not diaphoretic.  HENT:     Head: Normocephalic and atraumatic.     Nose: Nose normal.     Mouth/Throat:     Mouth: Mucous membranes are moist.  Eyes:     General: No scleral icterus.    Extraocular Movements: Extraocular movements intact.  Cardiovascular:  Rate and Rhythm: Normal rate and regular rhythm.     Pulses: Normal pulses.     Heart sounds: Normal heart sounds.  No murmur heard. Pulmonary:     Breath sounds: Normal breath sounds. No wheezing or rales.  Abdominal:     Palpations: Abdomen is soft.     Tenderness: There is no abdominal tenderness.  Musculoskeletal:     Cervical back: Neck supple. No tenderness.     Right lower leg: No edema.     Left lower leg: Edema (1+) present.  Skin:    General: Skin is warm.     Findings: No rash.  Neurological:     General: No focal deficit present.     Mental Status: He is alert and oriented to person, place, and time.     Sensory: Sensory deficit (B/l feet) present.     Motor: Weakness (4/5 in b/l LE) present.  Psychiatric:        Mood and Affect: Mood normal.        Behavior: Behavior normal.     BP 122/68 (BP Location: Left Arm, Patient Position: Sitting, Cuff Size: Normal)   Pulse 69   Ht 5' 9.5" (1.765 m)   Wt 204 lb 6.4 oz (92.7 kg)   SpO2 93%   BMI 29.75 kg/m  Wt Readings from Last 3 Encounters:  05/12/22 204 lb 6.4 oz (92.7 kg)  05/08/22 195 lb (88.5 kg)  02/21/22 212 lb (96.2 kg)    Lab Results  Component Value Date   TSH 1.63 08/08/2019   Lab Results  Component Value Date   WBC 8.7 05/08/2022   HGB 14.0 05/08/2022   HCT 44.6 05/08/2022   MCV 93.7 05/08/2022   PLT 193 05/08/2022   Lab Results  Component Value Date   NA 142 05/08/2022   K 4.6 05/08/2022   CO2 22 05/08/2022   GLUCOSE 99 05/08/2022   BUN 34 (H) 05/08/2022   CREATININE 2.10 (H) 05/08/2022   BILITOT 0.6 05/08/2022   ALKPHOS 54 05/08/2022   AST 30 05/08/2022   ALT 16 05/08/2022   PROT 8.2 (H) 05/08/2022   ALBUMIN 4.0 05/08/2022   CALCIUM 9.2 05/08/2022   ANIONGAP 8 05/08/2022   Lab Results  Component Value Date   CHOL 238 (H) 07/04/2019   Lab Results  Component Value Date   HDL 53 07/04/2019   Lab Results  Component Value Date   LDLCALC 163 (H) 07/04/2019   Lab Results  Component Value Date   TRIG 124 07/04/2019   Lab Results  Component Value Date   CHOLHDL 4.5 07/04/2019   Lab  Results  Component Value Date   HGBA1C 6.4 (H) 04/24/2017      Assessment & Plan:   Problem List Items Addressed This Visit       Cardiovascular and Mediastinum   Hypertension    BP Readings from Last 1 Encounters:  05/12/22 122/68  Well-controlled with lisinopril and amlodipine Counseled for compliance with the medications Advised DASH diet and moderate exercise/walking as tolerated      CAD (coronary artery disease)    S/p stent placement On aspirin and statin On Metoprolol Denies chest pain or dyspnea currently        Nervous and Auditory   Idiopathic peripheral neuropathy    On Lyrica 200 mg BID Better controlled now        Musculoskeletal and Integument   Osteoarthritis of knee    Advised to take Tylenol arthritis as  needed for knee pain Avoid oral NSAIDs due to CKD Planned to get right TKA        Genitourinary   CKD (chronic kidney disease)    Followed by nephrology - Dr. Theador Hawthorne, last visit note reviewed Recently had AKI on CKD On lisinopril and Farxiga for proteinuria On Lasix every other day On calcitriol and sodium bicarb        Other   Leg swelling    Likely due to chronic venous insufficiency Advised to try leg elevation and compression stocking On Lasix currently      Encounter for general adult medical examination with abnormal findings - Primary    Physical exam as documented. Recent blood tests reviewed from chart. Flu vaccine today. Advised to get Shingrix and Tdap vaccine at local pharmacy.      Other Visit Diagnoses     Need for immunization against influenza       Relevant Orders   Flu Vaccine QUAD High Dose(Fluad) (Completed)       No orders of the defined types were placed in this encounter.   Follow-up: Return in about 4 months (around 09/11/2022) for HTN and CKD.    Lindell Spar, MD

## 2022-05-12 NOTE — Assessment & Plan Note (Signed)
On Lyrica 200 mg BID Better controlled now

## 2022-05-12 NOTE — Assessment & Plan Note (Addendum)
Likely due to chronic venous insufficiency Advised to try leg elevation and compression stocking On Lasix currently

## 2022-05-15 MED ORDER — TRANEXAMIC ACID 1000 MG/10ML IV SOLN
2000.0000 mg | INTRAVENOUS | Status: DC
  Filled 2022-05-15: qty 20

## 2022-05-16 ENCOUNTER — Other Ambulatory Visit: Payer: Self-pay

## 2022-05-16 ENCOUNTER — Encounter (HOSPITAL_COMMUNITY): Payer: Self-pay | Admitting: Orthopedic Surgery

## 2022-05-16 ENCOUNTER — Ambulatory Visit (HOSPITAL_COMMUNITY)
Admission: RE | Admit: 2022-05-16 | Discharge: 2022-05-17 | Disposition: A | Discharge status: Home / Self Care | Payer: Medicare Other | Attending: Orthopedic Surgery | Admitting: Orthopedic Surgery

## 2022-05-16 ENCOUNTER — Encounter (HOSPITAL_COMMUNITY)
Admission: RE | Disposition: A | Discharge status: Home / Self Care | Payer: Self-pay | Source: Home / Self Care | Attending: Orthopedic Surgery

## 2022-05-16 ENCOUNTER — Ambulatory Visit (HOSPITAL_COMMUNITY): Payer: Medicare Other | Admitting: Physician Assistant

## 2022-05-16 ENCOUNTER — Ambulatory Visit (HOSPITAL_BASED_OUTPATIENT_CLINIC_OR_DEPARTMENT_OTHER): Payer: Medicare Other | Admitting: Anesthesiology

## 2022-05-16 DIAGNOSIS — I2510 Atherosclerotic heart disease of native coronary artery without angina pectoris: Secondary | ICD-10-CM | POA: Insufficient documentation

## 2022-05-16 DIAGNOSIS — Z87891 Personal history of nicotine dependence: Secondary | ICD-10-CM | POA: Insufficient documentation

## 2022-05-16 DIAGNOSIS — I129 Hypertensive chronic kidney disease with stage 1 through stage 4 chronic kidney disease, or unspecified chronic kidney disease: Secondary | ICD-10-CM | POA: Diagnosis not present

## 2022-05-16 DIAGNOSIS — M1711 Unilateral primary osteoarthritis, right knee: Secondary | ICD-10-CM

## 2022-05-16 DIAGNOSIS — J449 Chronic obstructive pulmonary disease, unspecified: Secondary | ICD-10-CM | POA: Insufficient documentation

## 2022-05-16 DIAGNOSIS — N189 Chronic kidney disease, unspecified: Secondary | ICD-10-CM | POA: Insufficient documentation

## 2022-05-16 DIAGNOSIS — I252 Old myocardial infarction: Secondary | ICD-10-CM | POA: Diagnosis not present

## 2022-05-16 DIAGNOSIS — M21061 Valgus deformity, not elsewhere classified, right knee: Secondary | ICD-10-CM | POA: Insufficient documentation

## 2022-05-16 DIAGNOSIS — I10 Essential (primary) hypertension: Secondary | ICD-10-CM

## 2022-05-16 HISTORY — PX: TOTAL KNEE ARTHROPLASTY: SHX125

## 2022-05-16 LAB — TYPE AND SCREEN
ABO/RH(D): B POS
Antibody Screen: NEGATIVE

## 2022-05-16 SURGERY — ARTHROPLASTY, KNEE, TOTAL
Anesthesia: Spinal | Site: Knee | Laterality: Right

## 2022-05-16 MED ORDER — AMISULPRIDE (ANTIEMETIC) 5 MG/2ML IV SOLN: 10.0000 mg | Freq: Once | INTRAVENOUS | Status: DC | PRN

## 2022-05-16 MED ORDER — SODIUM CHLORIDE (PF) 0.9 % IJ SOLN
INTRAMUSCULAR | Status: AC
  Filled 2022-05-16: qty 50

## 2022-05-16 MED ORDER — METOCLOPRAMIDE HCL 5 MG/ML IJ SOLN: 5.0000 mg | Freq: Three times a day (TID) | INTRAMUSCULAR | Status: DC | PRN

## 2022-05-16 MED ORDER — OXYCODONE HCL 5 MG/5ML PO SOLN: 5.0000 mg | Freq: Once | ORAL | Status: DC | PRN

## 2022-05-16 MED ORDER — BUPIVACAINE-EPINEPHRINE (PF) 0.5% -1:200000 IJ SOLN
INTRAMUSCULAR | Status: DC | PRN
  Administered 2022-05-16: 15 mL via PERINEURAL

## 2022-05-16 MED ORDER — ACETAMINOPHEN 500 MG PO TABS
1000.0000 mg | ORAL_TABLET | Freq: Four times a day (QID) | ORAL | Status: AC
  Administered 2022-05-16 – 2022-05-17 (×4): 1000 mg via ORAL
  Filled 2022-05-16 (×4): qty 2

## 2022-05-16 MED ORDER — ASPIRIN 81 MG PO TBEC: 81.0000 mg | DELAYED_RELEASE_TABLET | Freq: Two times a day (BID) | ORAL | 0 refills | Status: DC

## 2022-05-16 MED ORDER — OXYCODONE HCL 5 MG PO TABS
5.0000 mg | ORAL_TABLET | ORAL | Status: DC | PRN
  Administered 2022-05-16: 10 mg via ORAL
  Administered 2022-05-16: 5 mg via ORAL
  Administered 2022-05-17 (×3): 10 mg via ORAL
  Filled 2022-05-16: qty 1
  Filled 2022-05-16 (×4): qty 2

## 2022-05-16 MED ORDER — HYDROMORPHONE HCL 1 MG/ML IJ SOLN
0.5000 mg | INTRAMUSCULAR | Status: DC | PRN
  Administered 2022-05-16: 1 mg via INTRAVENOUS
  Filled 2022-05-16: qty 1

## 2022-05-16 MED ORDER — ACETAMINOPHEN 500 MG PO TABS
1000.0000 mg | ORAL_TABLET | Freq: Once | ORAL | Status: AC
  Administered 2022-05-16: 1000 mg via ORAL
  Filled 2022-05-16: qty 2

## 2022-05-16 MED ORDER — SORBITOL 70 % SOLN: 30.0000 mL | Freq: Every day | Status: DC | PRN

## 2022-05-16 MED ORDER — POLYETHYLENE GLYCOL 3350 17 G PO PACK: 17.0000 g | PACK | Freq: Every day | ORAL | Status: DC | PRN

## 2022-05-16 MED ORDER — DOCUSATE SODIUM 100 MG PO CAPS
100.0000 mg | ORAL_CAPSULE | Freq: Two times a day (BID) | ORAL | Status: DC
  Administered 2022-05-16 – 2022-05-17 (×2): 100 mg via ORAL
  Filled 2022-05-16 (×2): qty 1

## 2022-05-16 MED ORDER — BUPIVACAINE IN DEXTROSE 0.75-8.25 % IT SOLN
INTRATHECAL | Status: DC | PRN
  Administered 2022-05-16: 1.6 mL via INTRATHECAL

## 2022-05-16 MED ORDER — SODIUM CHLORIDE 0.9% FLUSH
INTRAVENOUS | Status: DC | PRN
  Administered 2022-05-16: 50 mL

## 2022-05-16 MED ORDER — OXYCODONE HCL 5 MG PO TABS: 5.0000 mg | ORAL_TABLET | Freq: Once | ORAL | Status: DC | PRN

## 2022-05-16 MED ORDER — ASPIRIN 81 MG PO CHEW
81.0000 mg | CHEWABLE_TABLET | Freq: Two times a day (BID) | ORAL | Status: DC
  Administered 2022-05-16 – 2022-05-17 (×2): 81 mg via ORAL
  Filled 2022-05-16 (×2): qty 1

## 2022-05-16 MED ORDER — SODIUM BICARBONATE 650 MG PO TABS
650.0000 mg | ORAL_TABLET | Freq: Three times a day (TID) | ORAL | Status: DC
  Administered 2022-05-16 – 2022-05-17 (×4): 650 mg via ORAL
  Filled 2022-05-16 (×4): qty 1

## 2022-05-16 MED ORDER — ONDANSETRON HCL 4 MG/2ML IJ SOLN
INTRAMUSCULAR | Status: DC | PRN
  Administered 2022-05-16: 4 mg via INTRAVENOUS

## 2022-05-16 MED ORDER — CLONIDINE HCL (ANALGESIA) 100 MCG/ML EP SOLN
EPIDURAL | Status: DC | PRN
  Administered 2022-05-16: 100 ug

## 2022-05-16 MED ORDER — ONDANSETRON HCL 4 MG PO TABS: 4.0000 mg | ORAL_TABLET | Freq: Four times a day (QID) | ORAL | Status: DC | PRN

## 2022-05-16 MED ORDER — ONDANSETRON HCL 4 MG/2ML IJ SOLN: 4.0000 mg | Freq: Four times a day (QID) | INTRAMUSCULAR | Status: DC | PRN

## 2022-05-16 MED ORDER — TRANEXAMIC ACID-NACL 1000-0.7 MG/100ML-% IV SOLN
1000.0000 mg | INTRAVENOUS | Status: AC
  Administered 2022-05-16: 1000 mg via INTRAVENOUS
  Filled 2022-05-16: qty 100

## 2022-05-16 MED ORDER — POVIDONE-IODINE 10 % EX SWAB
2.0000 | Freq: Once | CUTANEOUS | Status: AC
  Administered 2022-05-16: 2 via TOPICAL

## 2022-05-16 MED ORDER — DOCUSATE SODIUM 100 MG PO CAPS: 100.0000 mg | ORAL_CAPSULE | Freq: Every day | ORAL | 2 refills | Status: DC | PRN

## 2022-05-16 MED ORDER — AMLODIPINE BESYLATE 10 MG PO TABS
10.0000 mg | ORAL_TABLET | Freq: Every day | ORAL | Status: DC
  Administered 2022-05-17: 10 mg via ORAL
  Filled 2022-05-16: qty 1

## 2022-05-16 MED ORDER — SODIUM CHLORIDE 0.9 % IR SOLN
Status: DC | PRN
  Administered 2022-05-16: 1000 mL

## 2022-05-16 MED ORDER — MIDAZOLAM HCL 2 MG/2ML IJ SOLN
2.0000 mg | INTRAMUSCULAR | Status: DC
  Filled 2022-05-16: qty 2

## 2022-05-16 MED ORDER — METOPROLOL SUCCINATE ER 50 MG PO TB24
50.0000 mg | ORAL_TABLET | Freq: Every day | ORAL | Status: DC
  Administered 2022-05-17: 50 mg via ORAL
  Filled 2022-05-16: qty 1

## 2022-05-16 MED ORDER — PHENYLEPHRINE HCL-NACL 20-0.9 MG/250ML-% IV SOLN
INTRAVENOUS | Status: DC | PRN
  Administered 2022-05-16: 50 ug/min via INTRAVENOUS

## 2022-05-16 MED ORDER — OXYCODONE HCL 5 MG PO TABS: ORAL_TABLET | ORAL | 0 refills | Status: DC

## 2022-05-16 MED ORDER — CEFAZOLIN SODIUM-DEXTROSE 1-4 GM/50ML-% IV SOLN
1.0000 g | Freq: Four times a day (QID) | INTRAVENOUS | Status: AC
  Administered 2022-05-16 (×2): 1 g via INTRAVENOUS
  Filled 2022-05-16 (×2): qty 50

## 2022-05-16 MED ORDER — CEFAZOLIN SODIUM-DEXTROSE 2-4 GM/100ML-% IV SOLN
2.0000 g | INTRAVENOUS | Status: AC
  Administered 2022-05-16: 2 g via INTRAVENOUS
  Filled 2022-05-16: qty 100

## 2022-05-16 MED ORDER — NITROGLYCERIN 0.4 MG SL SUBL: 0.4000 mg | SUBLINGUAL_TABLET | SUBLINGUAL | Status: DC | PRN

## 2022-05-16 MED ORDER — BUPIVACAINE-EPINEPHRINE 0.5% -1:200000 IJ SOLN
INTRAMUSCULAR | Status: DC | PRN
  Administered 2022-05-16: 15 mL

## 2022-05-16 MED ORDER — METOCLOPRAMIDE HCL 5 MG PO TABS: 5.0000 mg | ORAL_TABLET | Freq: Three times a day (TID) | ORAL | Status: DC | PRN

## 2022-05-16 MED ORDER — PHENYLEPHRINE HCL (PRESSORS) 10 MG/ML IV SOLN
INTRAVENOUS | Status: DC | PRN
  Administered 2022-05-16: 80 ug via INTRAVENOUS

## 2022-05-16 MED ORDER — WATER FOR IRRIGATION, STERILE IR SOLN
Status: DC | PRN
  Administered 2022-05-16: 2000 mL

## 2022-05-16 MED ORDER — FENTANYL CITRATE PF 50 MCG/ML IJ SOSY: 25.0000 ug | PREFILLED_SYRINGE | INTRAMUSCULAR | Status: DC | PRN

## 2022-05-16 MED ORDER — FENTANYL CITRATE PF 50 MCG/ML IJ SOSY
100.0000 ug | PREFILLED_SYRINGE | INTRAMUSCULAR | Status: DC
  Administered 2022-05-16: 50 ug via INTRAVENOUS
  Filled 2022-05-16: qty 2

## 2022-05-16 MED ORDER — BUPIVACAINE-EPINEPHRINE (PF) 0.5% -1:200000 IJ SOLN
INTRAMUSCULAR | Status: AC
  Filled 2022-05-16: qty 30

## 2022-05-16 MED ORDER — LISINOPRIL 10 MG PO TABS
10.0000 mg | ORAL_TABLET | Freq: Every day | ORAL | Status: DC
  Administered 2022-05-17: 10 mg via ORAL
  Filled 2022-05-16: qty 1

## 2022-05-16 MED ORDER — DIPHENHYDRAMINE HCL 12.5 MG/5ML PO ELIX: 12.5000 mg | ORAL_SOLUTION | ORAL | Status: DC | PRN

## 2022-05-16 MED ORDER — BUPIVACAINE LIPOSOME 1.3 % IJ SUSP
INTRAMUSCULAR | Status: DC | PRN
  Administered 2022-05-16: 20 mL

## 2022-05-16 MED ORDER — FLEET ENEMA 7-19 GM/118ML RE ENEM: 1.0000 | ENEMA | Freq: Once | RECTAL | Status: DC | PRN

## 2022-05-16 MED ORDER — DEXAMETHASONE SODIUM PHOSPHATE 10 MG/ML IJ SOLN
INTRAMUSCULAR | Status: DC | PRN
  Administered 2022-05-16: 10 mg via INTRAVENOUS

## 2022-05-16 MED ORDER — MENTHOL 3 MG MT LOZG: 1.0000 | LOZENGE | OROMUCOSAL | Status: DC | PRN

## 2022-05-16 MED ORDER — FUROSEMIDE 20 MG PO TABS
20.0000 mg | ORAL_TABLET | Freq: Every day | ORAL | Status: DC
  Administered 2022-05-17: 20 mg via ORAL
  Filled 2022-05-16: qty 1

## 2022-05-16 MED ORDER — PHENOL 1.4 % MT LIQD: 1.0000 | OROMUCOSAL | Status: DC | PRN

## 2022-05-16 MED ORDER — BUPIVACAINE LIPOSOME 1.3 % IJ SUSP: 20.0000 mL | Freq: Once | INTRAMUSCULAR | Status: DC

## 2022-05-16 MED ORDER — TRANEXAMIC ACID-NACL 1000-0.7 MG/100ML-% IV SOLN
1000.0000 mg | Freq: Once | INTRAVENOUS | Status: AC
  Administered 2022-05-16: 1000 mg via INTRAVENOUS
  Filled 2022-05-16: qty 100

## 2022-05-16 MED ORDER — ACETAMINOPHEN 325 MG PO TABS: 325.0000 mg | ORAL_TABLET | Freq: Four times a day (QID) | ORAL | Status: DC | PRN

## 2022-05-16 MED ORDER — TRANEXAMIC ACID 1000 MG/10ML IV SOLN
INTRAVENOUS | Status: DC | PRN
  Administered 2022-05-16: 2000 mg via TOPICAL

## 2022-05-16 MED ORDER — PROPOFOL 500 MG/50ML IV EMUL
INTRAVENOUS | Status: DC | PRN
  Administered 2022-05-16: 75 ug/kg/min via INTRAVENOUS

## 2022-05-16 MED ORDER — PREGABALIN 100 MG PO CAPS
200.0000 mg | ORAL_CAPSULE | Freq: Two times a day (BID) | ORAL | Status: DC
  Administered 2022-05-16 – 2022-05-17 (×2): 200 mg via ORAL
  Filled 2022-05-16 (×2): qty 2

## 2022-05-16 MED ORDER — SODIUM CHLORIDE 0.9 % IV SOLN: INTRAVENOUS | Status: DC

## 2022-05-16 MED ORDER — LACTATED RINGERS IV SOLN: INTRAVENOUS | Status: DC | PRN

## 2022-05-16 MED ORDER — ROSUVASTATIN CALCIUM 10 MG PO TABS
10.0000 mg | ORAL_TABLET | Freq: Every day | ORAL | Status: DC
  Administered 2022-05-16 – 2022-05-17 (×2): 10 mg via ORAL
  Filled 2022-05-16 (×2): qty 1

## 2022-05-16 MED ORDER — EZETIMIBE 10 MG PO TABS
10.0000 mg | ORAL_TABLET | Freq: Every day | ORAL | Status: DC
  Administered 2022-05-16 – 2022-05-17 (×2): 10 mg via ORAL
  Filled 2022-05-16 (×2): qty 1

## 2022-05-16 MED ORDER — BUPIVACAINE LIPOSOME 1.3 % IJ SUSP
INTRAMUSCULAR | Status: AC
  Filled 2022-05-16: qty 20

## 2022-05-16 SURGICAL SUPPLY — 65 items
APL SKNCLS STERI-STRIP NONHPOA (GAUZE/BANDAGES/DRESSINGS) ×1
ATTUNE MED ANAT PAT 38 KNEE (Knees) IMPLANT
ATTUNE PS FEM RT SZ 6 CEM KNEE (Femur) IMPLANT
ATTUNE PSRP INSR SZ6 5 KNEE (Insert) IMPLANT
BAG COUNTER SPONGE SURGICOUNT (BAG) ×1 IMPLANT
BAG DECANTER FOR FLEXI CONT (MISCELLANEOUS) ×1 IMPLANT
BAG SPEC THK2 15X12 ZIP CLS (MISCELLANEOUS) ×1
BAG SPNG CNTER NS LX DISP (BAG) ×1
BAG ZIPLOCK 12X15 (MISCELLANEOUS) ×1 IMPLANT
BASE TIBIA ATTUNE KNEE SYS SZ6 (Knees) IMPLANT
BENZOIN TINCTURE PRP APPL 2/3 (GAUZE/BANDAGES/DRESSINGS) ×1 IMPLANT
BLADE SAGITTAL 25.0X1.19X90 (BLADE) ×1 IMPLANT
BLADE SAW SGTL 13X75X1.27 (BLADE) ×1 IMPLANT
BLADE SURG 15 STRL LF DISP TIS (BLADE) ×1 IMPLANT
BLADE SURG 15 STRL SS (BLADE) ×1
BLADE SURG SZ10 CARB STEEL (BLADE) ×2 IMPLANT
BNDG CMPR MED 10X6 ELC LF (GAUZE/BANDAGES/DRESSINGS) ×1
BNDG CMPR MED 15X6 ELC VLCR LF (GAUZE/BANDAGES/DRESSINGS) ×1
BNDG ELASTIC 6X10 VLCR STRL LF (GAUZE/BANDAGES/DRESSINGS) IMPLANT
BNDG ELASTIC 6X15 VLCR STRL LF (GAUZE/BANDAGES/DRESSINGS) ×1 IMPLANT
BOWL SMART MIX CTS (DISPOSABLE) ×1 IMPLANT
BSPLAT TIB 6 CMNT ROT PLAT STR (Knees) ×1 IMPLANT
CEMENT HV SMART SET (Cement) IMPLANT
CLSR STERI-STRIP ANTIMIC 1/2X4 (GAUZE/BANDAGES/DRESSINGS) ×2 IMPLANT
COVER SURGICAL LIGHT HANDLE (MISCELLANEOUS) ×1 IMPLANT
CUFF TOURN SGL QUICK 34 (TOURNIQUET CUFF) ×1
CUFF TRNQT CYL 34X4.125X (TOURNIQUET CUFF) ×1 IMPLANT
DRAPE INCISE IOBAN 66X45 STRL (DRAPES) ×1 IMPLANT
DRAPE U-SHAPE 47X51 STRL (DRAPES) ×1 IMPLANT
DRESSING AQUACEL AG SP 3.5X10 (GAUZE/BANDAGES/DRESSINGS) ×1 IMPLANT
DRSG AQUACEL AG ADV 3.5X10 (GAUZE/BANDAGES/DRESSINGS) IMPLANT
DRSG AQUACEL AG SP 3.5X10 (GAUZE/BANDAGES/DRESSINGS) ×1
DURAPREP 26ML APPLICATOR (WOUND CARE) ×2 IMPLANT
ELECT REM PT RETURN 15FT ADLT (MISCELLANEOUS) ×1 IMPLANT
GLOVE BIOGEL PI IND STRL 8 (GLOVE) ×2 IMPLANT
GLOVE SURG ORTHO 8.0 STRL STRW (GLOVE) ×1 IMPLANT
GLOVE SURG POLYISO LF SZ7.5 (GLOVE) ×1 IMPLANT
GOWN STRL REUS W/ TWL XL LVL3 (GOWN DISPOSABLE) ×2 IMPLANT
GOWN STRL REUS W/TWL XL LVL3 (GOWN DISPOSABLE) ×2
HANDPIECE INTERPULSE COAX TIP (DISPOSABLE) ×1
HOLDER FOLEY CATH W/STRAP (MISCELLANEOUS) IMPLANT
HOOD PEEL AWAY T7 (MISCELLANEOUS) ×1 IMPLANT
IMMOBILIZER KNEE 20 (SOFTGOODS) ×1
IMMOBILIZER KNEE 20 THIGH 36 (SOFTGOODS) ×1 IMPLANT
KIT TURNOVER KIT A (KITS) IMPLANT
MANIFOLD NEPTUNE II (INSTRUMENTS) ×1 IMPLANT
NEEDLE HYPO 22GX1.5 SAFETY (NEEDLE) ×2 IMPLANT
NS IRRIG 1000ML POUR BTL (IV SOLUTION) ×1 IMPLANT
PACK TOTAL KNEE CUSTOM (KITS) ×1 IMPLANT
PIN STEINMAN FIXATION KNEE (PIN) IMPLANT
PROTECTOR NERVE ULNAR (MISCELLANEOUS) ×1 IMPLANT
SET HNDPC FAN SPRY TIP SCT (DISPOSABLE) ×1 IMPLANT
SPIKE FLUID TRANSFER (MISCELLANEOUS) ×2 IMPLANT
SUT ETHIBOND NAB CT1 #1 30IN (SUTURE) ×2 IMPLANT
SUT MNCRL AB 3-0 PS2 18 (SUTURE) ×1 IMPLANT
SUT VIC AB 0 CT1 36 (SUTURE) ×1 IMPLANT
SUT VIC AB 2-0 CT1 27 (SUTURE) ×2
SUT VIC AB 2-0 CT1 TAPERPNT 27 (SUTURE) ×2 IMPLANT
SYR CONTROL 10ML LL (SYRINGE) ×3 IMPLANT
TIBIA ATTUNE KNEE SYS BASE SZ6 (Knees) ×1 IMPLANT
TOWEL OR 17X26 10 PK STRL BLUE (TOWEL DISPOSABLE) ×1 IMPLANT
TRAY FOLEY MTR SLVR 16FR STAT (SET/KITS/TRAYS/PACK) ×1 IMPLANT
TUBE SUCTION HIGH CAP CLEAR NV (SUCTIONS) ×1 IMPLANT
WATER STERILE IRR 1000ML POUR (IV SOLUTION) ×2 IMPLANT
WRAP KNEE MAXI GEL POST OP (GAUZE/BANDAGES/DRESSINGS) ×1 IMPLANT

## 2022-05-16 NOTE — Anesthesia Procedure Notes (Signed)
Spinal  Patient location during procedure: OR Start time: 05/16/2022 10:29 AM End time: 05/16/2022 10:32 AM Reason for block: surgical anesthesia Staffing Performed: anesthesiologist  Anesthesiologist: Brennan Bailey, MD Performed by: Brennan Bailey, MD Authorized by: Brennan Bailey, MD   Preanesthetic Checklist Completed: patient identified, IV checked, risks and benefits discussed, surgical consent, monitors and equipment checked, pre-op evaluation and timeout performed Spinal Block Patient position: sitting Prep: DuraPrep and site prepped and draped Patient monitoring: continuous pulse ox, blood pressure and heart rate Approach: midline Location: L3-4 Injection technique: single-shot Needle Needle type: Pencan  Needle gauge: 24 G Needle length: 9 cm Assessment Events: CSF return Additional Notes Risks, benefits, and alternative discussed. Patient gave consent to procedure. Prepped and draped in sitting position. Patient sedated but responsive to voice. Clear CSF obtained on second attempt. Positive terminal aspiration. No pain or paraesthesias with injection. Patient tolerated procedure well. Vital signs stable. Tawny Asal, MD

## 2022-05-16 NOTE — Op Note (Signed)
NAME: Kyle Patel, BENZEL MEDICAL RECORD NO: [id number] ACCOUNT NO: [id number] DATE OF BIRTH: 10-10-1945 FACILITY: [facility] LOCATION: [facility] PHYSICIAN: W D. Valeta Harms., MD  Operative Report   DATE OF PROCEDURE: 05/16/2022  PREOPERATIVE DIAGNOSIS:  Severe osteoarthritis, right knee with valgus deformity.  POSTOPERATIVE DIAGNOSIS:  Severe osteoarthritis, right knee with valgus deformity.  PROCEDURE:  Right total knee replacement (DePuy Attune total knee) size 6 femur, 6 tibia, 5 mm tibial insert with 38 mm anatomic patella.  SURGEON:  W D. Valeta Harms., MD.  Terrence DupontMarjo Bicker.  ANESTHESIA:  Spinal with block.  TOURNIQUET TIME:  58 minutes.  DESCRIPTION OF PROCEDURE:  Supine position, exsanguination of leg, inflation of thigh tourniquet 350.  Straight skin incision with medial parapatellar approach to the knee was made.  We did a 5 degree valgus, 10 mm cut on the femur, followed by cutting  about 4 mm below the most diseased lateral compartment due to the valgus deformity with extension gap measured at 5 mm.  The femur was sized to be a size 6 femur with placement of all in one cutting block, appropriate degree of external rotation  accomplishing the anterior, posterior chamfer cuts.  PCL was released.  Small osteophytes were removed from the posterior aspect of the knee.  The subcutaneous tissues and capsular tissues infiltrated with Marcaine with Exparel.  We sized the tibia to be  a size #6 as well with placement of the keel cut for the tibia followed by cutting 9-1/2 mm patella.  Placement of a 38 mm anatomic trial.  The patient was noted to have good alignment, excellent balance of the ligaments with full extension.  Cement was  prepared on the back table, inserted into doughy state, tibia followed by femur, patella.  Cement was allowed to harden.  Small trial bearing was removed.  Small bits of cement were removed from the posterior aspect of the knee.  Tourniquet was  released.  No  excessive bleeding was noted.  Some small bleeders were coagulated.  We then placed the final 5 mm bearing to match the 6 femur.  Closure was affected with #1 Ethibond, 2-0 Vicryl and Monocryl.  Taken to recovery room in stable condition.   PUS D: 05/16/2022 12:12:23 pm T: 05/16/2022 12:50:00 pm  JOB: [id number]/ [id number]

## 2022-05-16 NOTE — Progress Notes (Signed)
Orthopedic Tech Progress Note Patient Details:  Kyle Patel. September 13, 1945 [id number]   0-90 CPM was applied in PACU. CPM will be removed at 4:55.  Post Interventions Patient Tolerated: Well Ortho Devices Type of Ortho Device: Bone foam zero knee Ortho Device/Splint Location: Right knee Ortho Device/Splint Interventions: Application   Post Interventions Patient Tolerated: Well  [name] 05/16/2022, 12:55 PM

## 2022-05-16 NOTE — Discharge Instructions (Signed)

## 2022-05-16 NOTE — TOC Transition Note (Signed)
Transition of Care [facility]) - CM/SW Discharge Note  Patient Details  Name: Kyle Patel. MRN: [id number] Date of Birth: 18-Apr-1946  [facility] CM/SW Contact:  Sherie Don, LCSW Phone Number: 05/16/2022, 3:55 PM  Clinical Narrative: Patient is expected to discharge home after passing with PT. CSW met with patient and family to confirm discharge plan. Patient will discharge home with HHPT, which was prearranged with [facility]. Patient has a rolling walker at home and family has private paid for a hospital bed as the bedrooms are upstairs in patient's home, so there are no DME needs at this time. TOC signing off.  Final next level of care: Home w Home Health Services Barriers to Discharge: No Barriers Identified  Patient Goals and CMS Choice CMS Medicare.gov Compare Post Acute Care list provided to:: Patient Choice offered to / list presented to : Patient  Discharge Plan and Services Additional resources added to the After Visit Summary for N/A     DME Arranged: N/A DME Agency: NA HH Agency: [place] Representative spoke with at [place] in orthopedist's office  Social Determinants of Health ([place]) Interventions SDOH Screenings   Food Insecurity: No Food Insecurity (10/29/2021)  Housing: Low Risk  (10/29/2021)  Transportation Needs: No Transportation Needs (10/29/2021)  Alcohol Screen: Low Risk  (10/29/2021)  Depression (PHQ2-9): Low Risk  (05/12/2022)  Financial Resource Strain: Low Risk  (10/29/2021)  Physical Activity: Inactive (10/29/2021)  Social Connections: Socially Integrated (10/29/2021)  Stress: No Stress Concern Present (10/29/2021)  Tobacco Use: Medium Risk (05/16/2022)   Readmission Risk Interventions     No data to display

## 2022-05-16 NOTE — Transfer of Care (Signed)
Immediate Anesthesia Transfer of Care Note  Patient: Kyle Patel.  Procedure(s) Performed: TOTAL KNEE ARTHROPLASTY (Right: Knee)  Patient Location: PACU  Anesthesia Type:Spinal  Level of Consciousness: sedated, patient cooperative, and responds to stimulation  Airway & Oxygen Therapy: Patient Spontanous Breathing and Patient connected to face mask oxygen  Post-op Assessment: Report given to RN and Post -op Vital signs reviewed and stable  Post vital signs: Reviewed and stable  Last Vitals:  Vitals Value Taken Time  BP    Temp    Pulse 54 05/16/22 1237  Resp 8 05/16/22 1237  SpO2 95 % 05/16/22 1237  Vitals shown include unvalidated device data.  Last Pain:  Vitals:   05/16/22 0903  TempSrc:   PainSc: 0-No pain         Complications: No notable events documented.

## 2022-05-16 NOTE — Anesthesia Procedure Notes (Signed)
Anesthesia Regional Block: Adductor canal block   Pre-Anesthetic Checklist: , timeout performed,  Correct Patient, Correct Site, Correct Laterality,  Correct Procedure, Correct Position, site marked,  Risks and benefits discussed,  Pre-op evaluation,  At surgeon's request and post-op pain management  Laterality: Right  Prep: Maximum Sterile Barrier Precautions used, chloraprep       Needles:  Injection technique: Single-shot  Needle Type: Echogenic Stimulator Needle     Needle Length: 9cm  Needle Gauge: 22     Additional Needles:   Procedures:,,,, ultrasound used (permanent image in chart),,    Narrative:  Start time: 05/16/2022 8:55 AM End time: 05/16/2022 8:58 AM Injection made incrementally with aspirations every 5 mL.  Performed by: Personally  Anesthesiologist: Brennan Bailey, MD  Additional Notes: Risks, benefits, and alternative discussed. Patient gave consent for procedure. Patient prepped and draped in sterile fashion. Sedation administered, patient remains easily responsive to voice. Relevant anatomy identified with ultrasound guidance. Local anesthetic given in 5cc increments with no signs or symptoms of intravascular injection. No pain or paraesthesias with injection. Patient monitored throughout procedure with signs of LAST or immediate complications. Tolerated well. Ultrasound image placed in chart.  Tawny Asal, MD

## 2022-05-16 NOTE — Interval H&P Note (Signed)
History and Physical Interval Note:  05/16/2022 9:13 AM  Kyle Patel.  has presented today for surgery, with the diagnosis of OA RIGHT KNEE.  The various methods of treatment have been discussed with the patient and family. After consideration of risks, benefits and other options for treatment, the patient has consented to  Procedure(s): TOTAL KNEE ARTHROPLASTY (Right) as a surgical intervention.  The patient's history has been reviewed, patient examined, no change in status, stable for surgery.  I have reviewed the patient's chart and labs.  Questions were answered to the patient's satisfaction.     Yvette Rack

## 2022-05-16 NOTE — Anesthesia Postprocedure Evaluation (Signed)
Anesthesia Post Note  Patient: Kyle Patel.  Procedure(s) Performed: TOTAL KNEE ARTHROPLASTY (Right: Knee)     Patient location during evaluation: PACU Anesthesia Type: Spinal Level of consciousness: awake and alert Pain management: pain level controlled Vital Signs Assessment: post-procedure vital signs reviewed and stable Respiratory status: spontaneous breathing, nonlabored ventilation and respiratory function stable Cardiovascular status: blood pressure returned to baseline Postop Assessment: no apparent nausea or vomiting, spinal receding, no headache and no backache Anesthetic complications: no   No notable events documented.  Last Vitals:  Vitals:   05/16/22 1315 05/16/22 1330  BP: 95/64 101/62  Pulse: (!) 48 (!) 50  Resp: 12 11  Temp:    SpO2: 94% 96%    Last Pain:  Vitals:   05/16/22 1300  TempSrc:   PainSc: 0-No pain                 Marthenia Rolling

## 2022-05-17 ENCOUNTER — Other Ambulatory Visit: Payer: Self-pay | Admitting: Orthopedic Surgery

## 2022-05-17 DIAGNOSIS — M1711 Unilateral primary osteoarthritis, right knee: Secondary | ICD-10-CM | POA: Diagnosis not present

## 2022-05-17 NOTE — Progress Notes (Signed)
Physical Therapy Treatment Patient Details Name: Kyle Patel. MRN: [id number] DOB: [birth year] Today's Date: 05/17/2022   History of Present Illness Pt s/p R TKR and with hx of back surgeries, MI, COPD and peripheral neuropathy    PT Comments    Pt very motivated and progressing well with mobility.  Pt up to ambulate increased distance in hall, negotiated stairs in multiple different ways and reviewed bed mobility and transfers.  Pt has spoken to spouse who advised HHPT will be to see him tomorrow.  Pt much more comfortable with dc home this date.   Recommendations for follow up therapy are one component of a multi-disciplinary discharge planning process, led by the attending physician.  Recommendations may be updated based on patient status, additional functional criteria and insurance authorization.  Follow Up Recommendations  Follow physician's recommendations for discharge plan and follow up therapies     Assistance Recommended at Discharge Intermittent Supervision/Assistance  Patient can return home with the following A little help with walking and/or transfers;A little help with bathing/dressing/bathroom;Assistance with cooking/housework;Assist for transportation;Help with stairs or ramp for entrance   Equipment Recommendations  None recommended by PT    Recommendations for Other Services       Precautions / Restrictions Precautions Precautions: Knee;Fall Restrictions Weight Bearing Restrictions: No Other Position/Activity Restrictions: WBAT     Mobility  Bed Mobility Overal bed mobility: Needs Assistance Bed Mobility: Supine to Sit, Sit to Supine     Supine to sit: Supervision Sit to supine: Supervision   General bed mobility comments: min cues for sequence and use of L LE to self assist    Transfers Overall transfer level: Needs assistance Equipment used: Rolling walker (2 wheels) Transfers: Sit to/from Stand Sit to Stand: Min guard, Supervision            General transfer comment: min cues for LE management and use of UEs to self assist    Ambulation/Gait Ambulation/Gait assistance: Min guard, Supervision Gait Distance (Feet): 100 Feet Assistive device: Rolling walker (2 wheels) Gait Pattern/deviations: Step-to pattern, Step-through pattern, Shuffle, Trunk flexed Gait velocity: decr     General Gait Details: min cues for sequence, posture and position from RW   Stairs Stairs: Yes Stairs assistance: Min assist Stair Management: No rails, One rail Right, Step to pattern, Backwards, Forwards, With walker, With cane Number of Stairs: 10 General stair comments: single step bkwd to use stool into high bed; 2 steps twice bkwd to enter home with no rails, 5 steps fwd with rail and cane to access second level; cues for sequence and foot/cane, RW placement   Wheelchair Mobility    Modified Rankin (Stroke Patients Only)       Balance Overall balance assessment: Needs assistance Sitting-balance support: No upper extremity supported, Feet supported Sitting balance-Leahy Scale: Good     Standing balance support: No upper extremity supported Standing balance-Leahy Scale: Fair                              Cognition Arousal/Alertness: Awake/alert Behavior During Therapy: WFL for tasks assessed/performed Overall Cognitive Status: Within Functional Limits for tasks assessed                                          Exercises Total Joint Exercises Ankle Circles/Pumps: AROM, Both, 15 reps, Supine Quad Sets: AROM,  Both, 10 reps, Supine Heel Slides: AAROM, Right, Supine, 15 reps Hip ABduction/ADduction: AROM, Right, 10 reps, Supine Straight Leg Raises: AAROM, AROM, Right, 15 reps, Supine    General Comments        Pertinent Vitals/Pain Pain Assessment Pain Assessment: 0-10 Pain Score: 4  Pain Location: R knee Pain Descriptors / Indicators: Aching, Sore Pain Intervention(s): Limited activity  within patient's tolerance, Monitored during session, Premedicated before session, Ice applied    Home Living Family/patient expects to be discharged to:: Private residence Living Arrangements: Spouse/significant other Available Help at Discharge: Family;Available 24 hours/day Type of Home: House Home Access: Stairs to enter Entrance Stairs-Rails: None Entrance Stairs-Number of Steps: 3 Alternate Level Stairs-Number of Steps: flight Home Layout: Two level Home Equipment: Conservation officer, nature (2 wheels);Kasandra Knudsen - single point Additional Comments: Pt states he has arranged to rent a hospital bed but will not be delivered until Tuesday    Prior Function            PT Goals (current goals can now be found in the care plan section) Acute Rehab PT Goals Patient Stated Goal: Regain IND PT Goal Formulation: With patient Time For Goal Achievement: 05/24/22 Potential to Achieve Goals: Good Progress towards PT goals: Progressing toward goals    Frequency    7X/week      PT Plan Current plan remains appropriate    Co-evaluation              AM-PAC PT "6 Clicks" Mobility   Outcome Measure  Help needed turning from your back to your side while in a flat bed without using bedrails?: A Little Help needed moving from lying on your back to sitting on the side of a flat bed without using bedrails?: A Little Help needed moving to and from a bed to a chair (including a wheelchair)?: A Little Help needed standing up from a chair using your arms (e.g., wheelchair or bedside chair)?: A Little Help needed to walk in hospital room?: A Little Help needed climbing 3-5 steps with a railing? : A Little 6 Click Score: 18    End of Session Equipment Utilized During Treatment: Gait belt Activity Tolerance: Patient tolerated treatment well Patient left: in bed;with bed alarm set;with call bell/phone within reach Nurse Communication: Mobility status PT Visit Diagnosis: Difficulty in walking, not  elsewhere classified (R26.2)     Time: 1400-1431 PT Time Calculation (min) (ACUTE ONLY): 31 min  Charges:  $Gait Training: 8-22 mins $Therapeutic Exercise: 23-37 mins $Therapeutic Activity: 8-22 mins                     [place] Pager [phone] Office [phone]    Nadie Fiumara 05/17/2022, 3:59 PM

## 2022-05-17 NOTE — Progress Notes (Signed)
The patient is alert and oriented and has been seen by his physician. The orders for discharge were written. IV has been removed. Went over discharge instructions with patient. He is being discharged via wheelchair with all of his belongings.

## 2022-05-17 NOTE — Progress Notes (Signed)
Physical Therapy Treatment Patient Details Name: Kyle Patel. MRN: [id number] DOB: Mar 02, 1946 Today's Date: 05/17/2022   History of Present Illness Pt s/p R TKR and with hx of back surgeries, MI, COPD and peripheral neuropathy    PT Comments    Pt performed HEP with written instruction provided and reviewed.  Pt with multiple questions asked and answered.  Will follow up shortly for stair training.   Recommendations for follow up therapy are one component of a multi-disciplinary discharge planning process, led by the attending physician.  Recommendations may be updated based on patient status, additional functional criteria and insurance authorization.  Follow Up Recommendations  Follow physician's recommendations for discharge plan and follow up therapies     Assistance Recommended at Discharge Intermittent Supervision/Assistance  Patient can return home with the following A little help with walking and/or transfers;A little help with bathing/dressing/bathroom;Assistance with cooking/housework;Assist for transportation;Help with stairs or ramp for entrance   Equipment Recommendations  None recommended by PT    Recommendations for Other Services       Precautions / Restrictions Precautions Precautions: Knee;Fall Restrictions Weight Bearing Restrictions: No Other Position/Activity Restrictions: WBAT     Mobility  Bed Mobility Overal bed mobility: Needs Assistance Bed Mobility: Supine to Sit     Supine to sit: Min assist     General bed mobility comments: cues for sequence and use of L LE to self assist    Transfers Overall transfer level: Needs assistance Equipment used: Rolling walker (2 wheels) Transfers: Sit to/from Stand Sit to Stand: Min assist, Min guard           General transfer comment: cues for LE management and use of UEs to self assist    Ambulation/Gait Ambulation/Gait assistance: Min assist, Min guard Gait Distance (Feet): 90  Feet Assistive device: Rolling walker (2 wheels) Gait Pattern/deviations: Step-to pattern, Step-through pattern, Shuffle, Trunk flexed       General Gait Details: cues for sequence, posture and position from [employer]             Wheelchair Mobility    Modified Rankin (Stroke Patients Only)       Balance Overall balance assessment: Needs assistance Sitting-balance support: No upper extremity supported, Feet supported Sitting balance-Leahy Scale: Good     Standing balance support: Bilateral upper extremity supported Standing balance-Leahy Scale: Poor                              Cognition Arousal/Alertness: Awake/alert Behavior During Therapy: WFL for tasks assessed/performed Overall Cognitive Status: Within Functional Limits for tasks assessed                                          Exercises Total Joint Exercises Ankle Circles/Pumps: AROM, Both, 15 reps, Supine Quad Sets: AROM, Both, 10 reps, Supine Heel Slides: AAROM, Right, Supine, 15 reps Hip ABduction/ADduction: AROM, Right, 10 reps, Supine Straight Leg Raises: AAROM, AROM, Right, 15 reps, Supine    General Comments        Pertinent Vitals/Pain Pain Assessment Pain Assessment: 0-10 Pain Score: 4  Pain Location: R knee Pain Descriptors / Indicators: Aching, Sore Pain Intervention(s): Limited activity within patient's tolerance, Monitored during session, Premedicated before session, Ice applied    Home Living Family/patient expects to be discharged to:: Private residence Living Arrangements: Spouse/significant  other Available Help at Discharge: Family;Available 24 hours/day Type of Home: House Home Access: Stairs to enter Entrance Stairs-Rails: None Entrance Stairs-Number of Steps: 3 Alternate Level Stairs-Number of Steps: flight Home Layout: Two level Home Equipment: Conservation officer, nature (2 wheels);Kasandra Knudsen - single point Additional Comments: Pt states he has arranged  to rent a hospital bed but will not be delivered until Tuesday    Prior Function            PT Goals (current goals can now be found in the care plan section) Acute Rehab PT Goals Patient Stated Goal: Regain IND PT Goal Formulation: With patient Time For Goal Achievement: 05/24/22 Potential to Achieve Goals: Good Progress towards PT goals: Progressing toward goals    Frequency    7X/week      PT Plan Current plan remains appropriate    Co-evaluation              AM-PAC PT "6 Clicks" Mobility   Outcome Measure  Help needed turning from your back to your side while in a flat bed without using bedrails?: A Little Help needed moving from lying on your back to sitting on the side of a flat bed without using bedrails?: A Little Help needed moving to and from a bed to a chair (including a wheelchair)?: A Little Help needed standing up from a chair using your arms (e.g., wheelchair or bedside chair)?: A Little Help needed to walk in hospital room?: A Little Help needed climbing 3-5 steps with a railing? : A Little 6 Click Score: 18    End of Session Equipment Utilized During Treatment: Gait belt Activity Tolerance: Patient tolerated treatment well Patient left: in bed;with bed alarm set;with call bell/phone within reach Nurse Communication: Mobility status PT Visit Diagnosis: Difficulty in walking, not elsewhere classified (R26.2)     Time: 8882-8003 PT Time Calculation (min) (ACUTE ONLY): 24 min  Charges:  $Therapeutic Exercise: 23-37 mins                     [place] Pager [phone] Office [phone]    Kyle Patel 05/17/2022, 3:53 PM

## 2022-05-17 NOTE — Plan of Care (Signed)
  Problem: Education: Goal: Knowledge of General Education information will improve Description: Including pain rating scale, medication(s)/side effects and non-pharmacologic comfort measures Outcome: Progressing   Problem: Health Behavior/Discharge Planning: Goal: Ability to manage health-related needs will improve Outcome: Progressing   Problem: Pain Managment: Goal: General experience of comfort will improve Outcome: Progressing   

## 2022-05-17 NOTE — Evaluation (Signed)
Physical Therapy Evaluation Patient Details Name: Kyle Patel. MRN: [id number] DOB: Oct 29, 1945 Today's Date: 05/17/2022  History of Present Illness  Pt s/p R TKR and with hx of back surgeries, MI, COPD and peripheral neuropathy  Clinical Impression  Pt s/p R TKR and presents with decreased R LE strength/ROM and post op pain limiting functional mobility.  Pt motivated and should progress well to dc home with family assist.  Pt reports HHPT to start 05/20/22     Recommendations for follow up therapy are one component of a multi-disciplinary discharge planning process, led by the attending physician.  Recommendations may be updated based on patient status, additional functional criteria and insurance authorization.  Follow Up Recommendations Follow physician's recommendations for discharge plan and follow up therapies      Assistance Recommended at Discharge Intermittent Supervision/Assistance  Patient can return home with the following  A little help with walking and/or transfers;A little help with bathing/dressing/bathroom;Assistance with cooking/housework;Assist for transportation;Help with stairs or ramp for entrance    Equipment Recommendations None recommended by PT  Recommendations for Other Services       Functional Status Assessment Patient has had a recent decline in their functional status and demonstrates the ability to make significant improvements in function in a reasonable and predictable amount of time.     Precautions / Restrictions Precautions Precautions: Knee;Fall Restrictions Weight Bearing Restrictions: No      Mobility  Bed Mobility Overal bed mobility: Needs Assistance Bed Mobility: Supine to Sit     Supine to sit: Min assist     General bed mobility comments: cues for sequence and use of L LE to self assist    Transfers Overall transfer level: Needs assistance Equipment used: Rolling walker (2 wheels) Transfers: Sit to/from Stand Sit to  Stand: Min assist, Min guard           General transfer comment: cues for LE management and use of UEs to self assist    Ambulation/Gait Ambulation/Gait assistance: Min assist, Min guard Gait Distance (Feet): 90 Feet Assistive device: Rolling walker (2 wheels) Gait Pattern/deviations: Step-to pattern, Step-through pattern, Shuffle, Trunk flexed       General Gait Details: cues for sequence, posture and position from [employer]            Wheelchair Mobility    Modified Rankin (Stroke Patients Only)       Balance Overall balance assessment: Needs assistance Sitting-balance support: No upper extremity supported, Feet supported Sitting balance-Leahy Scale: Good     Standing balance support: Bilateral upper extremity supported Standing balance-Leahy Scale: Poor                               Pertinent Vitals/Pain Pain Assessment Pain Assessment: 0-10 Pain Score: 4  Pain Location: R knee Pain Descriptors / Indicators: Aching, Sore Pain Intervention(s): Limited activity within patient's tolerance, Monitored during session, Premedicated before session, Ice applied    Home Living Family/patient expects to be discharged to:: Private residence Living Arrangements: Spouse/significant other Available Help at Discharge: Family;Available 24 hours/day Type of Home: House Home Access: Stairs to enter Entrance Stairs-Rails: None Entrance Stairs-Number of Steps: 3 Alternate Level Stairs-Number of Steps: flight Home Layout: Two level Home Equipment: Conservation officer, nature (2 wheels);Kasandra Knudsen - single point Additional Comments: Pt states he has arranged to rent a hospital bed but will not be delivered until Tuesday    Prior Function Prior Level of Function :  Independent/Modified Independent                     Hand Dominance   Dominant Hand: Right    Extremity/Trunk Assessment   Upper Extremity Assessment Upper Extremity Assessment: Overall WFL for tasks  assessed    Lower Extremity Assessment Lower Extremity Assessment: RLE deficits/detail RLE Deficits / Details: AAROM at knee -5 - 80 with IND SLR    Cervical / Trunk Assessment Cervical / Trunk Assessment: Normal  Communication   Communication: No difficulties  Cognition Arousal/Alertness: Awake/alert Behavior During Therapy: WFL for tasks assessed/performed Overall Cognitive Status: Within Functional Limits for tasks assessed                                          General Comments      Exercises Total Joint Exercises Ankle Circles/Pumps: AROM, Both, 15 reps, Supine Quad Sets: AROM, Both, 10 reps, Supine Heel Slides: AAROM, Right, 10 reps, Supine Straight Leg Raises: AAROM, AROM, Right, 15 reps, Supine   Assessment/Plan    PT Assessment Patient needs continued PT services  PT Problem List Decreased strength;Decreased range of motion;Decreased activity tolerance;Decreased balance;Decreased mobility;Decreased knowledge of use of DME;Pain       PT Treatment Interventions DME instruction;Gait training;Stair training;Functional mobility training;Therapeutic activities;Therapeutic exercise;Patient/family education    PT Goals (Current goals can be found in the Care Plan section)  Acute Rehab PT Goals Patient Stated Goal: Regain IND PT Goal Formulation: With patient Time For Goal Achievement: 05/24/22 Potential to Achieve Goals: Good    Frequency 7X/week     Co-evaluation               AM-PAC PT "6 Clicks" Mobility  Outcome Measure Help needed turning from your back to your side while in a flat bed without using bedrails?: A Little Help needed moving from lying on your back to sitting on the side of a flat bed without using bedrails?: A Little Help needed moving to and from a bed to a chair (including a wheelchair)?: A Little Help needed standing up from a chair using your arms (e.g., wheelchair or bedside chair)?: A Little Help needed to walk  in hospital room?: A Little Help needed climbing 3-5 steps with a railing? : A Little 6 Click Score: 18    End of Session Equipment Utilized During Treatment: Gait belt Activity Tolerance: Patient tolerated treatment well Patient left: in chair;with call bell/phone within reach;with chair alarm set Nurse Communication: Mobility status PT Visit Diagnosis: Difficulty in walking, not elsewhere classified (R26.2)    Time: 0947-0962 PT Time Calculation (min) (ACUTE ONLY): 36 min   Charges:   PT Evaluation $PT Eval Low Complexity: 1 Low PT Treatments $Therapeutic Exercise: 8-22 mins        Debe Coder PT Acute Rehabilitation Services Pager [phone] Office [phone]   Jamespaul Secrist 05/17/2022, 12:16 PM

## 2022-05-17 NOTE — Discharge Summary (Signed)
Physician Discharge Summary  Patient ID: Kyle Patel. MRN: [id number] DOB/AGE: 09/29/1945 76 y.o.  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 is an 76 y.o. male who was admitted 05/16/2022 with a diagnosis of Primary localized osteoarthritis of right knee and went to the operating room on 05/16/2022 and underwent the above named procedures.    He was given perioperative antibiotics:  Anti-infectives (From admission, onward)    Start     Dose/Rate Route Frequency Ordered Stop   05/16/22 1600  ceFAZolin (ANCEF) IVPB 1 g/50 mL premix        1 g 100 mL/hr over 30 Minutes Intravenous Every 6 hours 05/16/22 1529 05/16/22 2334   05/16/22 0815  ceFAZolin (ANCEF) IVPB 2g/100 mL premix        2 g 200 mL/hr over 30 Minutes Intravenous On call to O.R. 05/16/22 5681 05/16/22 1040     .  He was given sequential compression devices, early ambulation, and ASA for DVT prophylaxis.  He benefited maximally from the hospital stay and there were no complications.    Recent vital signs:  Vitals:   05/17/22 1201 05/17/22 1218  BP: (!) 142/60   Pulse: 71 74  Resp:  20  Temp:  97.9 F  (36.6 C)  SpO2: 93% 96%    Recent laboratory studies:  Lab Results  Component Value Date   HGB 14.0 05/08/2022   HGB 13.4 08/08/2019   HGB 13.6 05/14/2018   Lab Results  Component Value Date   WBC 8.7 05/08/2022   PLT 193 05/08/2022   Lab Results  Component Value Date   INR 1.04 04/24/2017   Lab Results  Component Value Date   NA 142 05/08/2022   K 4.6 05/08/2022   CL 112 (H) 05/08/2022   CO2 22 05/08/2022   BUN 34 (H) 05/08/2022   CREATININE 2.10 (H) 05/08/2022   GLUCOSE 99 05/08/2022    Discharge Medications:   Allergies as of 05/17/2022       Reactions   Levaquin [levofloxacin] Itching   Statins    Myalgias        Medication List     TAKE these medications    acetaminophen 650 MG CR tablet Commonly known as: Tylenol 8 Hour Take 1 tablet (650 mg total) by mouth every 8 (eight) hours as needed for pain.   amLODipine 10 MG tablet Commonly known as: NORVASC Take 1 tablet (10 mg total) by mouth daily.   aspirin EC 81 MG tablet Take 1 tablet (81 mg total) by mouth 2 (two) times daily. What changed: when to take this   calcitRIOL 0.25 MCG capsule Commonly known as: ROCALTROL  Take 0.25 mcg by mouth every Monday, Wednesday, and Friday.   CENTRUM PO Take 1 tablet by mouth every morning.   Coenzyme Q10-Vitamin E 100-150 MG-UNIT Caps Take 1 capsule by mouth at bedtime.   dapagliflozin propanediol 5 MG Tabs tablet Commonly known as: FARXIGA Take by mouth.   docusate sodium 100 MG capsule Commonly known as: Colace Take 1 capsule (100 mg total) by mouth daily as needed.   ezetimibe 10 MG tablet Commonly known as: ZETIA TAKE (1) TABLET BY MOUTH ONCE DAILY.   furosemide 20 MG tablet Commonly known as: LASIX TAKE 1 TABLET BY MOUTH ONCE DAILY.   lisinopril 10 MG tablet Commonly known as: ZESTRIL Take 10 mg by mouth daily.   metoprolol succinate 50 MG 24 hr tablet Commonly known as: TOPROL-XL Take 50 mg by mouth daily.   nitroGLYCERIN 0.4  MG SL tablet Commonly known as: NITROSTAT PLACE 1 TAB UNDER TONGUE EVERY 5 MIN IF NEEDED FOR CHEST PAIN. MAY USE 3 TIMES.NO RELIEF CALL 911.   Osteo Bi-Flex Adv Joint Shield Tabs Take 1 tablet by mouth daily.   oxyCODONE 5 MG immediate release tablet Commonly known as: Oxy IR/ROXICODONE Take one tab po q4-6hrs prn pain   pregabalin 200 MG capsule Commonly known as: LYRICA Take 1 capsule (200 mg total) by mouth 2 (two) times daily.   rosuvastatin 10 MG tablet Commonly known as: CRESTOR TAKE (1) TABLET BY MOUTH ONCE DAILY.   sodium bicarbonate 650 MG tablet Take 650 mg by mouth 3 (three) times daily.               Durable Medical Equipment  (From admission, onward)           Start     Ordered   05/16/22 1530  DME Walker rolling  Once       Question Answer Comment  Walker: With 5 Inch Wheels   Patient needs a walker to treat with the following condition Primary localized osteoarthritis of right knee      05/16/22 1529            Diagnostic Studies: No results found.  Disposition: Discharge disposition: 01-Home or Self Care          Follow-up Information     Earlie Server, MD. Schedule an appointment as soon as possible for a visit in 2 week(s).   Specialty: Orthopedic Surgery Contact information: [address] [place] [address] [phone]         [facility] Follow up.   Specialty: Home Health Services Why: [facility] will provide PT in the home after discharge. Contact information: [address] [place] [address] [phone]                  Signed: Britt Bottom PA-C Office [phone] 05/17/2022, 3:29 PM

## 2022-05-18 ENCOUNTER — Observation Stay (HOSPITAL_COMMUNITY)
Admission: EM | Admit: 2022-05-18 | Discharge: 2022-05-19 | Disposition: A | Discharge status: Home Health | Payer: Medicare Other | Attending: Family Medicine | Admitting: Family Medicine

## 2022-05-18 ENCOUNTER — Emergency Department (HOSPITAL_COMMUNITY): Payer: Medicare Other

## 2022-05-18 DIAGNOSIS — Z79899 Other long term (current) drug therapy: Secondary | ICD-10-CM | POA: Insufficient documentation

## 2022-05-18 DIAGNOSIS — Z1152 Encounter for screening for COVID-19: Secondary | ICD-10-CM | POA: Insufficient documentation

## 2022-05-18 DIAGNOSIS — Z96651 Presence of right artificial knee joint: Secondary | ICD-10-CM

## 2022-05-18 DIAGNOSIS — E1151 Type 2 diabetes mellitus with diabetic peripheral angiopathy without gangrene: Secondary | ICD-10-CM | POA: Insufficient documentation

## 2022-05-18 DIAGNOSIS — Z86711 Personal history of pulmonary embolism: Secondary | ICD-10-CM | POA: Diagnosis not present

## 2022-05-18 DIAGNOSIS — Z87891 Personal history of nicotine dependence: Secondary | ICD-10-CM | POA: Diagnosis not present

## 2022-05-18 DIAGNOSIS — R0602 Shortness of breath: Secondary | ICD-10-CM | POA: Diagnosis present

## 2022-05-18 DIAGNOSIS — E875 Hyperkalemia: Secondary | ICD-10-CM | POA: Diagnosis present

## 2022-05-18 DIAGNOSIS — J441 Chronic obstructive pulmonary disease with (acute) exacerbation: Secondary | ICD-10-CM | POA: Diagnosis not present

## 2022-05-18 DIAGNOSIS — J9601 Acute respiratory failure with hypoxia: Principal | ICD-10-CM | POA: Diagnosis present

## 2022-05-18 DIAGNOSIS — I129 Hypertensive chronic kidney disease with stage 1 through stage 4 chronic kidney disease, or unspecified chronic kidney disease: Secondary | ICD-10-CM | POA: Insufficient documentation

## 2022-05-18 DIAGNOSIS — N1832 Chronic kidney disease, stage 3b: Secondary | ICD-10-CM | POA: Diagnosis not present

## 2022-05-18 DIAGNOSIS — N183 Chronic kidney disease, stage 3 unspecified: Secondary | ICD-10-CM | POA: Diagnosis present

## 2022-05-18 DIAGNOSIS — N189 Chronic kidney disease, unspecified: Secondary | ICD-10-CM | POA: Diagnosis present

## 2022-05-18 DIAGNOSIS — G609 Hereditary and idiopathic neuropathy, unspecified: Secondary | ICD-10-CM | POA: Diagnosis present

## 2022-05-18 DIAGNOSIS — I10 Essential (primary) hypertension: Secondary | ICD-10-CM | POA: Diagnosis present

## 2022-05-18 DIAGNOSIS — R7303 Prediabetes: Secondary | ICD-10-CM | POA: Diagnosis present

## 2022-05-18 DIAGNOSIS — I2510 Atherosclerotic heart disease of native coronary artery without angina pectoris: Secondary | ICD-10-CM | POA: Diagnosis present

## 2022-05-18 LAB — CBC WITH DIFFERENTIAL/PLATELET
Abs Immature Granulocytes: 0.12 10*3/uL — ABNORMAL HIGH (ref 0.00–0.07)
Basophils Absolute: 0 10*3/uL (ref 0.0–0.1)
Basophils Relative: 0 %
Eosinophils Absolute: 0 10*3/uL (ref 0.0–0.5)
Eosinophils Relative: 0 %
HCT: 39.6 % (ref 39.0–52.0)
Hemoglobin: 12.5 g/dL — ABNORMAL LOW (ref 13.0–17.0)
Immature Granulocytes: 1 %
Lymphocytes Relative: 11 %
Lymphs Abs: 1.7 10*3/uL (ref 0.7–4.0)
MCH: 29.7 pg (ref 26.0–34.0)
MCHC: 31.6 g/dL (ref 30.0–36.0)
MCV: 94.1 fL (ref 80.0–100.0)
Monocytes Absolute: 2.9 10*3/uL — ABNORMAL HIGH (ref 0.1–1.0)
Monocytes Relative: 19 %
Neutro Abs: 10.8 10*3/uL — ABNORMAL HIGH (ref 1.7–7.7)
Neutrophils Relative %: 69 %
Platelets: 146 10*3/uL — ABNORMAL LOW (ref 150–400)
RBC: 4.21 MIL/uL — ABNORMAL LOW (ref 4.22–5.81)
RDW: 14.9 % (ref 11.5–15.5)
WBC: 15.6 10*3/uL — ABNORMAL HIGH (ref 4.0–10.5)
nRBC: 0 % (ref 0.0–0.2)

## 2022-05-18 LAB — COMPREHENSIVE METABOLIC PANEL
ALT: 12 U/L (ref 0–44)
AST: 30 U/L (ref 15–41)
Albumin: 3.5 g/dL (ref 3.5–5.0)
Alkaline Phosphatase: 54 U/L (ref 38–126)
Anion gap: 7 (ref 5–15)
BUN: 46 mg/dL — ABNORMAL HIGH (ref 8–23)
CO2: 19 mmol/L — ABNORMAL LOW (ref 22–32)
Calcium: 8.4 mg/dL — ABNORMAL LOW (ref 8.9–10.3)
Chloride: 109 mmol/L (ref 98–111)
Creatinine, Ser: 2.04 mg/dL — ABNORMAL HIGH (ref 0.61–1.24)
GFR, Estimated: 34 mL/min — ABNORMAL LOW (ref >60)
Glucose, Bld: 120 mg/dL — ABNORMAL HIGH (ref 70–99)
Potassium: 5.3 mmol/L — ABNORMAL HIGH (ref 3.5–5.1)
Sodium: 135 mmol/L (ref 135–145)
Total Bilirubin: 0.6 mg/dL (ref 0.3–1.2)
Total Protein: 7.6 g/dL (ref 6.5–8.1)

## 2022-05-18 LAB — BLOOD GAS, VENOUS
Acid-base deficit: 7.4 mmol/L — ABNORMAL HIGH (ref 0.0–2.0)
Bicarbonate: 17.3 mmol/L — ABNORMAL LOW (ref 20.0–28.0)
Drawn by: 66480
O2 Saturation: 91.4 %
Patient temperature: 36.8
pCO2, Ven: 32 mmHg — ABNORMAL LOW (ref 44–60)
pH, Ven: 7.34 (ref 7.25–7.43)
pO2, Ven: 58 mmHg — ABNORMAL HIGH (ref 32–45)

## 2022-05-18 LAB — RESP PANEL BY RT-PCR (RSV, FLU A&B, COVID)  RVPGX2
Influenza A by PCR: NEGATIVE
Influenza B by PCR: NEGATIVE
Resp Syncytial Virus by PCR: NEGATIVE
SARS Coronavirus 2 by RT PCR: NEGATIVE

## 2022-05-18 LAB — GLUCOSE, CAPILLARY: Glucose-Capillary: 158 mg/dL — ABNORMAL HIGH (ref 70–99)

## 2022-05-18 LAB — MAGNESIUM: Magnesium: 2.2 mg/dL (ref 1.7–2.4)

## 2022-05-18 LAB — D-DIMER, QUANTITATIVE: D-Dimer, Quant: 1.91 ug/mL-FEU — ABNORMAL HIGH (ref 0.00–0.50)

## 2022-05-18 LAB — BRAIN NATRIURETIC PEPTIDE: B Natriuretic Peptide: 696 pg/mL — ABNORMAL HIGH (ref 0.0–100.0)

## 2022-05-18 LAB — TROPONIN I (HIGH SENSITIVITY)
Troponin I (High Sensitivity): 23 ng/L — ABNORMAL HIGH (ref <18)
Troponin I (High Sensitivity): 28 ng/L — ABNORMAL HIGH (ref <18)

## 2022-05-18 MED ORDER — ACETAMINOPHEN 650 MG RE SUPP: 650.0000 mg | Freq: Four times a day (QID) | RECTAL | Status: DC | PRN

## 2022-05-18 MED ORDER — SODIUM BICARBONATE 650 MG PO TABS
650.0000 mg | ORAL_TABLET | Freq: Three times a day (TID) | ORAL | Status: DC
  Administered 2022-05-18 – 2022-05-19 (×2): 650 mg via ORAL
  Filled 2022-05-18 (×2): qty 1

## 2022-05-18 MED ORDER — AMLODIPINE BESYLATE 5 MG PO TABS
10.0000 mg | ORAL_TABLET | Freq: Every day | ORAL | Status: DC
  Administered 2022-05-18 – 2022-05-19 (×2): 10 mg via ORAL
  Filled 2022-05-18 (×2): qty 2

## 2022-05-18 MED ORDER — IOHEXOL 350 MG/ML SOLN
60.0000 mL | Freq: Once | INTRAVENOUS | Status: AC | PRN
  Administered 2022-05-18: 60 mL via INTRAVENOUS

## 2022-05-18 MED ORDER — METOPROLOL SUCCINATE ER 50 MG PO TB24
50.0000 mg | ORAL_TABLET | Freq: Every day | ORAL | Status: DC
  Administered 2022-05-18 – 2022-05-19 (×2): 50 mg via ORAL
  Filled 2022-05-18 (×2): qty 1

## 2022-05-18 MED ORDER — IPRATROPIUM-ALBUTEROL 0.5-2.5 (3) MG/3ML IN SOLN: 3.0000 mL | RESPIRATORY_TRACT | Status: DC | PRN

## 2022-05-18 MED ORDER — METHYLPREDNISOLONE SODIUM SUCC 125 MG IJ SOLR
60.0000 mg | Freq: Two times a day (BID) | INTRAMUSCULAR | Status: AC
  Administered 2022-05-18 – 2022-05-19 (×2): 60 mg via INTRAVENOUS
  Filled 2022-05-18 (×2): qty 2

## 2022-05-18 MED ORDER — FUROSEMIDE 10 MG/ML IJ SOLN
40.0000 mg | Freq: Once | INTRAMUSCULAR | Status: AC
  Administered 2022-05-18: 40 mg via INTRAVENOUS
  Filled 2022-05-18: qty 4

## 2022-05-18 MED ORDER — PREGABALIN 75 MG PO CAPS
200.0000 mg | ORAL_CAPSULE | Freq: Two times a day (BID) | ORAL | Status: DC
  Administered 2022-05-18 – 2022-05-19 (×2): 200 mg via ORAL
  Filled 2022-05-18 (×2): qty 1

## 2022-05-18 MED ORDER — POLYETHYLENE GLYCOL 3350 17 G PO PACK: 17.0000 g | PACK | Freq: Every day | ORAL | Status: DC | PRN

## 2022-05-18 MED ORDER — OXYCODONE-ACETAMINOPHEN 5-325 MG PO TABS
1.0000 | ORAL_TABLET | Freq: Four times a day (QID) | ORAL | Status: DC | PRN
  Administered 2022-05-19: 1 via ORAL
  Filled 2022-05-18: qty 1

## 2022-05-18 MED ORDER — PREDNISONE 20 MG PO TABS: 40.0000 mg | ORAL_TABLET | Freq: Every day | ORAL | Status: DC

## 2022-05-18 MED ORDER — ONDANSETRON HCL 4 MG/2ML IJ SOLN: 4.0000 mg | Freq: Four times a day (QID) | INTRAMUSCULAR | Status: DC | PRN

## 2022-05-18 MED ORDER — CALCITRIOL 0.25 MCG PO CAPS
0.2500 ug | ORAL_CAPSULE | ORAL | Status: DC
  Administered 2022-05-19: 0.25 ug via ORAL
  Filled 2022-05-18: qty 1

## 2022-05-18 MED ORDER — OXYCODONE-ACETAMINOPHEN 5-325 MG PO TABS
1.0000 | ORAL_TABLET | ORAL | Status: DC | PRN
  Administered 2022-05-18: 1 via ORAL
  Filled 2022-05-18: qty 1

## 2022-05-18 MED ORDER — ACETAMINOPHEN 325 MG PO TABS
650.0000 mg | ORAL_TABLET | Freq: Four times a day (QID) | ORAL | Status: DC | PRN
  Administered 2022-05-18 – 2022-05-19 (×2): 650 mg via ORAL
  Filled 2022-05-18 (×2): qty 2

## 2022-05-18 MED ORDER — OXYCODONE HCL 5 MG PO TABS
10.0000 mg | ORAL_TABLET | Freq: Once | ORAL | Status: AC
  Administered 2022-05-18: 10 mg via ORAL
  Filled 2022-05-18: qty 2

## 2022-05-18 MED ORDER — OXYCODONE HCL 5 MG PO TABS
5.0000 mg | ORAL_TABLET | Freq: Once | ORAL | Status: AC
  Administered 2022-05-18: 5 mg via ORAL
  Filled 2022-05-18: qty 1

## 2022-05-18 MED ORDER — IPRATROPIUM-ALBUTEROL 0.5-2.5 (3) MG/3ML IN SOLN
3.0000 mL | Freq: Four times a day (QID) | RESPIRATORY_TRACT | Status: DC
  Administered 2022-05-18: 3 mL via RESPIRATORY_TRACT
  Filled 2022-05-18: qty 3

## 2022-05-18 MED ORDER — FUROSEMIDE 20 MG PO TABS
20.0000 mg | ORAL_TABLET | Freq: Every day | ORAL | Status: DC
  Administered 2022-05-19: 20 mg via ORAL
  Filled 2022-05-18: qty 1

## 2022-05-18 MED ORDER — HEPARIN SODIUM (PORCINE) 5000 UNIT/ML IJ SOLN
5000.0000 [IU] | Freq: Three times a day (TID) | INTRAMUSCULAR | Status: DC
  Administered 2022-05-18 – 2022-05-19 (×2): 5000 [IU] via SUBCUTANEOUS
  Filled 2022-05-18 (×2): qty 1

## 2022-05-18 MED ORDER — INSULIN ASPART 100 UNIT/ML IJ SOLN
0.0000 [IU] | Freq: Three times a day (TID) | INTRAMUSCULAR | Status: DC
  Administered 2022-05-19: 3 [IU] via SUBCUTANEOUS
  Administered 2022-05-19: 2 [IU] via SUBCUTANEOUS

## 2022-05-18 MED ORDER — ONDANSETRON HCL 4 MG PO TABS: 4.0000 mg | ORAL_TABLET | Freq: Four times a day (QID) | ORAL | Status: DC | PRN

## 2022-05-18 MED ORDER — INSULIN ASPART 100 UNIT/ML IJ SOLN: 0.0000 [IU] | Freq: Every day | INTRAMUSCULAR | Status: DC

## 2022-05-18 MED ORDER — ROSUVASTATIN CALCIUM 10 MG PO TABS
10.0000 mg | ORAL_TABLET | Freq: Every day | ORAL | Status: DC
  Administered 2022-05-19: 10 mg via ORAL
  Filled 2022-05-18: qty 1

## 2022-05-18 NOTE — H&P (Signed)
History and Physical    Kyle Patel. GPQ:[id number] DOB: Oct 17, 1945 DOA: 05/18/2022  PCP: Lindell Spar, MD   Patient coming from: Home  I have personally briefly reviewed patient's old medical records in [place]  Chief Complaint: Low O2 sats  HPI: Kyle Patel. is a 76 y.o. male with medical history significant for  HTN, CAD, CKD, COPD patient was brought to the ED reports of low O2 sats. Patient was admitted 12/22 to 12/23, for right total knee arthroplasty by Dr. French Ana, which was done 11/22, he was discharged yesterday.   Physical therapy came to work with him today,and he was found to be hypoxic with O2 sats in the 80s on room air.  EMS was called, they placed him on 3 L.  Patient denies any respiratory symptoms, he has not had any difficulty breathing, no cough, no chest pain.  He has swelling to his right lower extremity.  At this time his only complaint is significant pain in his right lower extremity, as he had some challenges picking up his prescription on discharge.  Quit smoking cigarettes about 40 years ago.  ED Course: O2 sats dropping to 88% on room air.  Placed on 4 L nasal cannula.  Sats greater than 94%.  Heart rate 68-74.  Temperature 98.3.  Respirate rate 14-26.  Blood pressure systolic 094M to 768G. D-dimer 1.9.  WBC 15.6.  BNP 696.  VBG shows pH of 7.4, pCO2 of 32.   CT a chest negative for PE, suggest possible bronchitis. Hospitalist to admit for acute hypoxic respiratory failure  Review of Systems: As per HPI all other systems reviewed and negative.  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   Hyperlipidemia    Pulmonary embolism ([place]) 10/2011    Past Surgical History:  Procedure Laterality Date   ABDOMINAL SURGERY     colon polyp removed 2007- sepsis, coma for 4 months per pt   BIOPSY  12/24/2020   Procedure: BIOPSY;  Surgeon: Eloise Harman, DO;  Location[facility];  Service: Endoscopy;;   CATARACT EXTRACTION W/PHACO Right 08/19/2021   Procedure: CATARACT EXTRACTION PHACO AND INTRAOCULAR LENS PLACEMENT ([place]);  Surgeon: Baruch Goldmann, MD;  Location: [facility];  Service: Ophthalmology;  Laterality: Right;  CDE: 7.02    CATARACT EXTRACTION W/PHACO Left 09/30/2021   Procedure: CATARACT EXTRACTION PHACO AND INTRAOCULAR LENS PLACEMENT ([facility]);  Surgeon: Baruch Goldmann, MD;  Location: [facility];  Service: Ophthalmology;  Laterality: Left;  CDE 4.61   COLONOSCOPY  03/22/2010   RMR: 1. Normal rectum 2. Diminutive polyp at the mouth of the ileocecal valve  status post cold biopsy removal. Remaideer of the colonic mucosa and terminal ileum mucosa appeared normal.    COLONOSCOPY N/A 06/25/2015   Procedure: COLONOSCOPY;  Surgeon: Daneil Dolin, MD;  [facility];  Service: Endoscopy;  Laterality: N/A;  200-rescheduled 1/30 per [place]    COLONOSCOPY WITH PROPOFOL N/A 12/24/2020   Procedure: COLONOSCOPY WITH PROPOFOL;  Surgeon: Eloise Harman, DO;  [facility];  Service: Endoscopy;  Laterality: N/A;  ASA III / 8:00   CORONARY BALLOON ANGIOPLASTY N/A 04/24/2017   Procedure: CORONARY BALLOON ANGIOPLASTY;  Surgeon: Jettie Booze, MD;  Location: [facility];  Service: Cardiovascular;  Laterality: N/A;   LEFT HEART CATH AND CORONARY ANGIOGRAPHY N/A 04/24/2017   Procedure: LEFT HEART CATH AND CORONARY ANGIOGRAPHY;  Surgeon: Jettie Booze, MD;  Location: [facility];  Service: Cardiovascular;  Laterality: N/A;   LUMBAR LAMINECTOMY  06/02/2012   LUMBAR LAMINECTOMY/DECOMPRESSION MICRODISCECTOMY  06/02/2012   Procedure:  LUMBAR LAMINECTOMY/DECOMPRESSION MICRODISCECTOMY 1 LEVEL;  Surgeon: Floyce Stakes, MD;  Location: [facility];  Service: Neurosurgery;  Laterality: Bilateral;  Bilateral Lumbar five-sacral one Foraminotomy and microdiskectomy    POLYPECTOMY  12/24/2020   Procedure: POLYPECTOMY;  Surgeon: Eloise Harman, DO;  [facility];  Service: Endoscopy;;     reports that he quit smoking about 23 years ago. His smoking use included cigarettes. He has a 2.00 pack-year smoking history. He has never used smokeless tobacco. He reports that he does not drink alcohol and does not use drugs.  Allergies  Allergen Reactions   Levaquin [Levofloxacin] Itching   Statins     Myalgias    Family History  Problem Relation Age of Onset   Heart attack Mother    Diabetes Mother    Stroke Father    Heart attack Father    Arthritis Other    Colon cancer Sister 56       Estimated age of onset; just found out about it in 23.   Neuropathy Neg Hx    Prior to Admission medications   Medication Sig Start Date End Date Taking? Authorizing Provider  acetaminophen (TYLENOL 8 HOUR) 650 MG CR tablet Take 1 tablet (650 mg total) by mouth every 8 (eight) hours as needed for pain. Patient not taking: Reported on 05/17/2022 12/15/21   Leath-Warren, Alda Lea, NP  amLODipine (NORVASC) 10 MG tablet Take 1 tablet (10 mg total) by mouth daily. 11/19/21 11/14/22  Barrett, Evelene Croon, PA-C  calcitRIOL (ROCALTROL) 0.25 MCG capsule Take 0.25 mcg by mouth every Monday, Wednesday, and Friday. 07/09/20   [provider]  Coenzyme Q10-Vitamin E 100-150 MG-UNIT CAPS Take 1 capsule by mouth at bedtime.    [provider]  dapagliflozin propanediol (FARXIGA) 5 MG TABS tablet Take 5 mg by mouth See admin instructions. Take 5 mg by mouth on Monday's and Thursday's. 04/04/22 04/04/23  [provider]  ezetimibe (ZETIA) 10 MG tablet TAKE (1) TABLET BY MOUTH ONCE DAILY. Patient taking differently: Take 10 mg  by mouth daily. 07/17/21   Hilty, Nadean Corwin, MD  furosemide (LASIX) 20 MG tablet TAKE 1 TABLET BY MOUTH ONCE DAILY. 12/20/21   Satira Sark, MD  lisinopril (ZESTRIL) 10 MG tablet Take 10 mg by mouth daily.    [provider]  metoprolol succinate (TOPROL-XL) 50 MG 24 hr tablet Take 50 mg by mouth daily. 10/07/21   [provider]  Misc Natural Products (OSTEO BI-FLEX ADV JOINT SHIELD) TABS Take 1 tablet by mouth daily.    [provider]  Multiple Vitamins-Minerals (CENTRUM PO) Take 1 tablet by mouth every morning.    [provider]  nitroGLYCERIN (NITROSTAT) 0.4 MG SL tablet PLACE 1 TAB UNDER TONGUE EVERY 5 MIN IF NEEDED FOR CHEST PAIN. MAY USE 3 TIMES.NO RELIEF CALL 911. Patient taking differently: Place 0.4 mg under the tongue every 5 (five) minutes as needed for  chest pain. 01/21/20   Strader, Fransisco Hertz, PA-C  pregabalin (LYRICA) 200 MG capsule Take 1 capsule (200 mg total) by mouth 2 (two) times daily. 01/06/22   Lindell Spar, MD  rosuvastatin (CRESTOR) 10 MG tablet TAKE (1) TABLET BY MOUTH ONCE DAILY. Patient taking differently: Take 10 mg by mouth daily. 02/24/22   Satira Sark, MD  sodium bicarbonate 650 MG tablet Take 650 mg by mouth 3 (three) times daily. 06/20/20   [provider]    Physical Exam: Vitals:   05/18/22 1235 05/18/22 1330 05/18/22 1419 05/18/22 1500  BP: (!) 153/71 (!) 140/77 (!) 164/69 138/60  Pulse: 74 68 76 74  Resp: 18 14 (!) 26 (!) 25  Temp: 98.3 F (36.8 C)     TempSrc: Oral     SpO2: 92% 94% (!) 88% 94%  Weight:      Height:        Constitutional: NAD, calm, comfortable Vitals:   05/18/22 1235 05/18/22 1330 05/18/22 1419 05/18/22 1500  BP: (!) 153/71 (!) 140/77 (!) 164/69 138/60  Pulse: 74 68 76 74  Resp: 18 14 (!) 26 (!) 25  Temp: 98.3 F (36.8 C)     TempSrc: Oral     SpO2: 92% 94% (!) 88% 94%  Weight:      Height:       Eyes: PERRL, lids and conjunctivae normal ENMT: Mucous membranes  are moist. Posterior pharynx clear of any exudate or lesions.Normal dentition.  Neck: normal, supple, no masses, no thyromegaly Respiratory: Faint bilateral lower expiratory wheezing, no crackles. Normal respiratory effort. No accessory muscle use.  Cardiovascular: Regular rate and rhythm, no murmurs / rubs / gallops.  1+ pitting extremity edema- Right lower extremity only, wearing bilateral compression stockings, lower extremities warm.  Clean dressing to right knee  Abdomen: no tenderness, no masses palpated. No hepatosplenomegaly. Bowel sounds positive.  Musculoskeletal: no clubbing / cyanosis. No joint deformity upper and lower extremities.  Skin: no rashes, lesions, ulcers. No induration Neurologic: No apparent cranial nerve abnormality, moving extremities spontaneously. Psychiatric: Normal judgment and insight. Alert and oriented x 3. Normal mood.   Labs on Admission: I have personally reviewed following labs and imaging studies  CBC: Recent Labs  Lab 05/18/22 1116  WBC 15.6*  NEUTROABS 10.8*  HGB 12.5*  HCT 39.6  MCV 94.1  PLT 742*   Basic Metabolic Panel: Recent Labs  Lab 05/18/22 1116  NA 135  K 5.3*  CL 109  CO2 19*  GLUCOSE 120*  BUN 46*  CREATININE 2.04*  CALCIUM 8.4*  MG 2.2   GFR: Estimated Creatinine Clearance: 35.3 mL/min (A) (by C-G formula based on SCr of 2.04 mg/dL (H)). Liver Function Tests: Recent Labs  Lab 05/18/22 1116  AST 30  ALT 12  ALKPHOS 54  BILITOT 0.6  PROT 7.6  ALBUMIN 3.5   Radiological Exams on Admission: CT Angio Chest PE W and/or Wo Contrast  Result Date: 05/18/2022 CLINICAL DATA:  Pulmonary embolism (PE) suspected, high prob EXAM: CT ANGIOGRAPHY CHEST WITH CONTRAST TECHNIQUE: Multidetector CT imaging of the chest was performed using the standard protocol during bolus administration of intravenous contrast. Multiplanar CT image reconstructions and MIPs were obtained to evaluate the vascular anatomy. RADIATION DOSE REDUCTION:  This exam was performed according to the departmental dose-optimization program which includes automated exposure control, adjustment of the mA and/or kV according to patient size and/or use of iterative reconstruction technique. CONTRAST:  26m OMNIPAQUE IOHEXOL 350 MG/ML SOLN COMPARISON:  CT 11/10/2011 FINDINGS: Cardiovascular: Satisfactory opacification of the pulmonary arteries to the segmental level. No evidence of pulmonary embolism. Thoracic aorta is nonaneurysmal. Scattered atherosclerotic vascular calcifications of the aorta and coronary arteries. Normal heart size. No pericardial effusion. Mediastinum/Nodes: Multiple mildly enlarged mediastinal lymph nodes including right paratracheal node measuring 12 mm (series 5, image 70), precarinal node measuring 14 mm (series 5, image 86), and AP window node measuring 13 mm (series 5, image 93). Bilateral hilar lymph nodes are upper limits of normal in size. No axillary lymphadenopathy. Thyroid, trachea, and esophagus demonstrate no significant findings. Lungs/Pleura: Slightly low lung volumes with hypoventilatory changes including mild dependent atelectasis. Mild bronchial wall thickening. No focal airspace consolidation. No pleural effusion or pneumothorax. Upper Abdomen: No acute abnormality. Musculoskeletal: Mild gynecomastia, right greater than left. No acute or significant osseous findings. Review of the MIP images confirms the above findings. IMPRESSION: 1. No evidence of pulmonary embolism. 2. Mild bronchial wall thickening, which can be seen in the setting of bronchitis. No focal airspace consolidation. 3. Multiple mildly enlarged mediastinal lymph nodes, which may be reactive. 4. Aortic and coronary artery atherosclerosis (ICD10-I70.0). Electronically Signed   By: Davina Poke D.O.   On: 05/18/2022 15:23   DG Chest Port 1 View  Result Date: 05/18/2022 CLINICAL DATA:  Hypoxia.  Status post right knee arthroplasty. EXAM: PORTABLE CHEST 1 VIEW  COMPARISON:  04/24/2017 FINDINGS: Mild cardiac enlargement, stable. The costophrenic angles are indistinct. Asymmetric opacity in the left base may represent airspace disease or atelectasis. Pulmonary vascular congestion. IMPRESSION: 1. Asymmetric opacity in the left base may represent airspace disease or atelectasis. 2. Pulmonary vascular congestion.  T 3. The CP angles are indistinct. Small pleural effusions are not excluded. Electronically Signed   By: Kerby Moors M.D.   On: 05/18/2022 11:12    EKG: Independently reviewed.  Sinus rhythm rate 76, QTc 454.  Old RBBB.  No significant change from prior.  Assessment/Plan Principal Problem:   Acute respiratory failure with hypoxia (HCC) Active Problems:   CKD (chronic kidney disease)   S/P total knee arthroplasty, right   Hypertension   Idiopathic peripheral neuropathy   CAD (coronary artery disease)   COPD with acute exacerbation (HCC)   Assessment and Plan: * Acute respiratory failure with hypoxia (HCC) O2 sats dropping to 88% on room air here.  Currently on 4 L -sats greater than 94%.  Underwent spinal anesthesia for recent right total knee arthroplasty- 12/22.  Faint bilateral wheezing.  Denies dyspnea or other respiratory complaints.  BNP - 696. CTA chest negative for PE, suggest possible bronchitis. -Will treat as bronchitis for now, steroids, DuoNebs -Hold off on antibiotics for now -IV Lasix 40 mg x 1 given, resume home Lasix 20 mg in a.m.  S/P total knee arthroplasty, right 12/22 by Dr. Earlie Server at [facility].  On discharge- he was given -sequential compression devices, early ambulation, and ASA for DVT prophylaxis.  Has had challenges picking up his prescription from the pharmacy. -Oxycodone 5 mg as needed -WBC 15.6, possibly stress reaction, trend  CKD (chronic kidney disease) CKD 3 B.  Cr stable : 2. -Hold lisinopril 10 mg -Contrast exposure today for CTA, monitor for creatinine -Resume home oral bicarb,  calcitriol  Hypertension Stable. -Resume Norvasc, Lasix '20mg'$  in a.m.  COPD with acute exacerbation ([place]) Resenting with hypoxia without significant respiratory complaints. Hx of COPD, quit 40 years ago.  Faint bilateral lower lung wheezing. -Nebs, steroids  CAD (coronary artery disease) Stable.  History of  NSTEMI 2018 with PTCA  dLAD.   Prediabetes - Hold faxiga - Hgba1c - SSi- s    DVT prophylaxis: Heparin Code Status:  FULL_ confirmed with patient and spouse at bedside Family Communication: Spouse at bedside Disposition Plan: ~ 1 - 2 days Consults called: None  Admission status:  Obs tele     Author: Bethena Roys, MD 05/18/2022 6:14 PM  For on call review [email or web address].

## 2022-05-18 NOTE — Assessment & Plan Note (Addendum)
O2 sats dropping to 88% on room air here.  WEAN oxygen as able.  Underwent spinal anesthesia for recent right total knee arthroplasty- 12/22.  Faint bilateral wheezing.  Denies dyspnea or other respiratory complaints.  BNP - 696. CTA chest negative for PE, suggest possible bronchitis. -suspect atelectasis from recent surgery; he responded well to using incentive spirometry at bedside;  he has no symptoms of SOB. Will do a home O2 eval screen prior to DC and provide home oxygen as needed. -he is stable to discharge home today

## 2022-05-18 NOTE — Progress Notes (Signed)
Patient temperature is 101.1. Patient given tylenol 650 mg PO. Will recheck temperature.

## 2022-05-18 NOTE — Assessment & Plan Note (Addendum)
-   resume home meds at discharge

## 2022-05-18 NOTE — Assessment & Plan Note (Addendum)
12/22 by Dr. Earlie Server at [facility].  On discharge- he was given -sequential compression devices, early ambulation, and ASA for DVT prophylaxis.  Has had challenges picking up his prescription from the pharmacy. -Oxycodone 5 mg as needed -WBC 15.6, possibly stress reaction, trend

## 2022-05-18 NOTE — Progress Notes (Signed)
Patient given incentive spirometer. Patient teaching provided. Patient aware to use the spirometer every hour while awake.  Patient was able to achieve a initial volume of 1000 ml x5.

## 2022-05-18 NOTE — Assessment & Plan Note (Addendum)
CKD 3 B.  Cr stable : 2. -stable; resume all home meds -Resume home oral bicarb, calcitriol

## 2022-05-18 NOTE — ED Provider Notes (Addendum)
[facility] EMERGENCY DEPARTMENT Provider Note   CSN: [id number] Arrival date & time: 05/18/22  1022     History  Chief Complaint  Patient presents with   Shortness of Breath    Kyle Patel. is a 76 y.o. male.  HPI Patient presents for hypoxia.  Medical history includes HTN, CKD, CAD, HLD, chronic pain, prediabetes, COPD, and a remote history of PE.  He underwent right total knee replacement 2 days ago.  He was discharged from the hospital yesterday.  Earlier today, a home health aide came out to his home.  When checking his vital signs, he was found to be hypoxic with SpO2 in the low 80s on room air.  He is not on supplemental oxygen at baseline.  EMS was called to the home.  They placed him on 3 L of supplemental oxygen.  When taken off of oxygen, patient rapidly drops back to the low 80s.  Patient currently denies any symptoms other than expected postoperative pain to his right leg.    Home Medications Prior to Admission medications   Medication Sig Start Date End Date Taking? Authorizing Provider  acetaminophen (TYLENOL 8 HOUR) 650 MG CR tablet Take 1 tablet (650 mg total) by mouth every 8 (eight) hours as needed for pain. Patient not taking: Reported on 05/17/2022 12/15/21   Leath-Warren, Alda Lea, NP  amLODipine (NORVASC) 10 MG tablet Take 1 tablet (10 mg total) by mouth daily. 11/19/21 11/14/22  Barrett, Evelene Croon, PA-C  calcitRIOL (ROCALTROL) 0.25 MCG capsule Take 0.25 mcg by mouth every Monday, Wednesday, and Friday. 07/09/20   [provider]  Coenzyme Q10-Vitamin E 100-150 MG-UNIT CAPS Take 1 capsule by mouth at bedtime.    [provider]  dapagliflozin propanediol (FARXIGA) 5 MG TABS tablet Take 5 mg by mouth See admin instructions. Take 5 mg by mouth on Monday's and Thursday's. 04/04/22 04/04/23  [provider]  ezetimibe (ZETIA) 10 MG tablet TAKE (1) TABLET BY MOUTH ONCE DAILY. Patient taking differently: Take 10 mg by mouth daily. 07/17/21    Hilty, Nadean Corwin, MD  furosemide (LASIX) 20 MG tablet TAKE 1 TABLET BY MOUTH ONCE DAILY. 12/20/21   Satira Sark, MD  lisinopril (ZESTRIL) 10 MG tablet Take 10 mg by mouth daily.    [provider]  metoprolol succinate (TOPROL-XL) 50 MG 24 hr tablet Take 50 mg by mouth daily. 10/07/21   [provider]  Misc Natural Products (OSTEO BI-FLEX ADV JOINT SHIELD) TABS Take 1 tablet by mouth daily.    [provider]  Multiple Vitamins-Minerals (CENTRUM PO) Take 1 tablet by mouth every morning.    [provider]  nitroGLYCERIN (NITROSTAT) 0.4 MG SL tablet PLACE 1 TAB UNDER TONGUE EVERY 5 MIN IF NEEDED FOR CHEST PAIN. MAY USE 3 TIMES.NO RELIEF CALL 911. Patient taking differently: Place 0.4 mg under the tongue every 5 (five) minutes as needed for chest pain. 01/21/20   Strader, Fransisco Hertz, PA-C  pregabalin (LYRICA) 200 MG capsule Take 1 capsule (200 mg total) by mouth 2 (two) times daily. 01/06/22   Lindell Spar, MD  rosuvastatin (CRESTOR) 10 MG tablet TAKE (1) TABLET BY MOUTH ONCE DAILY. Patient taking differently: Take 10 mg by mouth daily. 02/24/22   Satira Sark, MD  sodium bicarbonate 650 MG tablet Take 650 mg by mouth 3 (three) times daily. 06/20/20   [provider]      Allergies    Levaquin [levofloxacin] and Statins  Review of Systems   Review of Systems  Musculoskeletal:  Positive for arthralgias.  All other systems reviewed and are negative.   Physical Exam Updated Vital Signs BP (!) 164/69   Pulse 76   Temp 98.3 F (36.8 C) (Oral)   Resp (!) 26   Ht 5' 9.5" (1.765 m)   Wt 88.5 kg   SpO2 (!) 88%   BMI 28.38 kg/m  Physical Exam Vitals and nursing note reviewed.  Constitutional:      General: He is not in acute distress.    Appearance: Normal appearance. He is well-developed. He is not ill-appearing, toxic-appearing or diaphoretic.  HENT:     Head: Normocephalic and atraumatic.     Right Ear: External ear normal.      Nose: Nose normal.     Mouth/Throat:     Mouth: Mucous membranes are moist.     Pharynx: Oropharynx is clear.  Eyes:     Extraocular Movements: Extraocular movements intact.     Conjunctiva/sclera: Conjunctivae normal.  Cardiovascular:     Rate and Rhythm: Normal rate and regular rhythm.     Heart sounds: No murmur heard. Pulmonary:     Effort: Pulmonary effort is normal. No respiratory distress.     Breath sounds: Normal breath sounds. No wheezing or rales.  Chest:     Chest wall: No tenderness.  Abdominal:     General: There is no distension.     Palpations: Abdomen is soft.     Tenderness: There is no abdominal tenderness.  Musculoskeletal:        General: Swelling present.     Cervical back: Normal range of motion and neck supple.  Skin:    General: Skin is warm and dry.     Coloration: Skin is not jaundiced.     Findings: No bruising.  Neurological:     General: No focal deficit present.     Mental Status: He is alert and oriented to person, place, and time.     Cranial Nerves: No cranial nerve deficit.     Sensory: No sensory deficit.     Motor: No weakness.     Coordination: Coordination normal.  Psychiatric:        Mood and Affect: Mood normal.        Behavior: Behavior normal.     ED Results / Procedures / Treatments   Labs (all labs ordered are listed, but only abnormal results are displayed) Labs Reviewed  COMPREHENSIVE METABOLIC PANEL - Abnormal; Notable for the following components:      Result Value   Potassium 5.3 (*)    CO2 19 (*)    Glucose, Bld 120 (*)    BUN 46 (*)    Creatinine, Ser 2.04 (*)    Calcium 8.4 (*)    GFR, Estimated 34 (*)    All other components within normal limits  BRAIN NATRIURETIC PEPTIDE - Abnormal; Notable for the following components:   B Natriuretic Peptide 696.0 (*)    All other components within normal limits  BLOOD GAS, VENOUS - Abnormal; Notable for the following components:   pCO2, Ven 32 (*)    pO2, Ven 58 (*)     Bicarbonate 17.3 (*)    Acid-base deficit 7.4 (*)    All other components within normal limits  CBC WITH DIFFERENTIAL/PLATELET - Abnormal; Notable for the following components:   WBC 15.6 (*)    RBC 4.21 (*)    Hemoglobin 12.5 (*)  Platelets 146 (*)    Neutro Abs 10.8 (*)    Monocytes Absolute 2.9 (*)    Abs Immature Granulocytes 0.12 (*)    All other components within normal limits  D-DIMER, QUANTITATIVE - Abnormal; Notable for the following components:   D-Dimer, Quant 1.91 (*)    All other components within normal limits  TROPONIN I (HIGH SENSITIVITY) - Abnormal; Notable for the following components:   Troponin I (High Sensitivity) 23 (*)    All other components within normal limits  TROPONIN I (HIGH SENSITIVITY) - Abnormal; Notable for the following components:   Troponin I (High Sensitivity) 28 (*)    All other components within normal limits  RESP PANEL BY RT-PCR (RSV, FLU A&B, COVID)  RVPGX2  MAGNESIUM    EKG EKG Interpretation  Date/Time:  Sunday May 18 2022 10:31:35 EST Ventricular Rate:  76 PR Interval:  169 QRS Duration: 142 QT Interval:  403 QTC Calculation: 454 R Axis:   -61 Text Interpretation: Sinus rhythm Atrial premature complex Right bundle branch block Anterior infarct, old Confirmed by Godfrey Pick [phone]) on 05/18/2022 10:54:46 AM  Radiology DG Chest Port 1 View  Result Date: 05/18/2022 CLINICAL DATA:  Hypoxia.  Status post right knee arthroplasty. EXAM: PORTABLE CHEST 1 VIEW COMPARISON:  04/24/2017 FINDINGS: Mild cardiac enlargement, stable. The costophrenic angles are indistinct. Asymmetric opacity in the left base may represent airspace disease or atelectasis. Pulmonary vascular congestion. IMPRESSION: 1. Asymmetric opacity in the left base may represent airspace disease or atelectasis. 2. Pulmonary vascular congestion.  T 3. The CP angles are indistinct. Small pleural effusions are not excluded. Electronically Signed   By: Kerby Moors M.D.    On: 05/18/2022 11:12    Procedures Procedures    Medications Ordered in ED Medications  oxyCODONE-acetaminophen (PERCOCET/ROXICET) 5-325 MG per tablet 1 tablet (has no administration in time range)  furosemide (LASIX) injection 40 mg (40 mg Intravenous Given 05/18/22 1244)  oxyCODONE (Oxy IR/ROXICODONE) immediate release tablet 5 mg (5 mg Oral Given 05/18/22 1249)    ED Course/ Medical Decision Making/ A&P                           Medical Decision Making Amount and/or Complexity of Data Reviewed Labs: ordered. Radiology: ordered.  Risk Prescription drug management.   This patient presents to the ED for concern of hypoxia, this involves an extensive number of treatment options, and is a complaint that carries with it a high risk of complications and morbidity.  The differential diagnosis includes pulmonary edema, PE, pneumothorax, pneumonia   Co morbidities that complicate the patient evaluation  HTN, CKD, CAD, HLD, chronic pain, prediabetes, COPD, and a remote history of PE   Additional history obtained:  Additional history obtained from EMS External records from outside source obtained and reviewed including EMR   Lab Tests:  I Ordered, and personally interpreted labs.  The pertinent results include: Cytosis and decreased hemoglobin consistent with recent orthopedic surgery; mild elevation in D-dimer, elevated BNP, mild elevation in troponin with stable repeat, baseline CKD, mild hyperkalemia   Imaging Studies ordered:  I ordered imaging studies including chest x-ray, CTA chest I independently visualized and interpreted imaging which showed pulmonary vascular congestion and asymmetric left basilar opacity; CTA showed no evidence of PE or focal pneumonia. I agree with the radiologist interpretation   Cardiac Monitoring: / EKG:  The patient was maintained on a cardiac monitor.  I personally viewed and interpreted the  cardiac monitored which showed an underlying  rhythm of: Sinus rhythm  Problem List / ED Course / Critical interventions / Medication management  76 year old male, 2 days postop from total right knee replacement, presenting for incidental finding of hypoxia.  This was identified when home health aide came to his home and checked his vital signs.  Patient denies any recent shortness of breath.  He does have a history of COPD.  He denies prior diagnosis of CHF but has been prescribed Lasix in the past.  He has a remote history of PE and is not currently on any blood thinners.  On exam, patient's breathing is unlabored.  Lungs are clear to auscultation.  Patient was kept on supplemental oxygen and placed on bedside cardiac monitor.  Diagnostic workup was initiated.  Lab results are notable for elevated BNP.  This raises concern of possible iatrogenic pulmonary edema causing his hypoxia.  Dose of Lasix was ordered.  A d-dimer was ordered and was modestly elevated.  PE remains on differential. CTA was ordered. Patient also has a leukocytosis and decrease in hemoglobin from baseline which is expected following his orthopedic surgery.  Chest x-ray showed some asymmetric basilar opacity.  CTA showed no evidence of PE and no focal opacification.  Hypoxia likely secondary to pulmonary edema.  Patient had good urine output while in the ED.  He remained on 3.5 L of supplemental oxygen with SpO2 in the low 90s.  Patient to be admitted for ongoing management. I ordered medication including Lasix for diuresis; oxycodone for postoperative knee pain Reevaluation of the patient after these medicines showed that the patient improved I have reviewed the patients home medicines and have made adjustments as needed   Social Determinants of Health:  Has access to outpatient care  CRITICAL CARE Performed by: Godfrey Pick   Total critical care time: 34 minutes  Critical care time was exclusive of separately billable procedures and treating other patients.  Critical  care was necessary to treat or prevent imminent or life-threatening deterioration.  Critical care was time spent personally by me on the following activities: development of treatment plan with patient and/or surrogate as well as nursing, discussions with consultants, evaluation of patient's response to treatment, examination of patient, obtaining history from patient or surrogate, ordering and performing treatments and interventions, ordering and review of laboratory studies, ordering and review of radiographic studies, pulse oximetry and re-evaluation of patient's condition.         Final Clinical Impression(s) / ED Diagnoses Final diagnoses:  Acute respiratory failure with hypoxia [facility])    Rx / DC Orders ED Discharge Orders     None          Godfrey Pick, MD 05/18/22 1533

## 2022-05-18 NOTE — ED Notes (Signed)
Pt changed into a clean gown and a depend placed at this time.

## 2022-05-18 NOTE — Assessment & Plan Note (Signed)
Stable.  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

## 2022-05-18 NOTE — ED Triage Notes (Signed)
BIB EMS- pt had right knee replacement yesterday at [facility] and d/c home. When [facility] came to home today pt had low SPO2. Pt states he didn't feel short of breath. History of PE years ago

## 2022-05-18 NOTE — Assessment & Plan Note (Signed)
Resenting with hypoxia without significant respiratory complaints. Hx of COPD, quit 40 years ago.  Faint bilateral lower lung wheezing. -Nebs, steroids

## 2022-05-18 NOTE — Assessment & Plan Note (Signed)
Stable. -Resume Norvasc, Lasix '20mg'$  in a.m.

## 2022-05-19 DIAGNOSIS — J9601 Acute respiratory failure with hypoxia: Secondary | ICD-10-CM | POA: Diagnosis not present

## 2022-05-19 DIAGNOSIS — E875 Hyperkalemia: Secondary | ICD-10-CM | POA: Diagnosis not present

## 2022-05-19 DIAGNOSIS — R7303 Prediabetes: Secondary | ICD-10-CM

## 2022-05-19 DIAGNOSIS — J441 Chronic obstructive pulmonary disease with (acute) exacerbation: Secondary | ICD-10-CM | POA: Diagnosis not present

## 2022-05-19 LAB — GLUCOSE, CAPILLARY
Glucose-Capillary: 164 mg/dL — ABNORMAL HIGH (ref 70–99)
Glucose-Capillary: 237 mg/dL — ABNORMAL HIGH (ref 70–99)

## 2022-05-19 LAB — BASIC METABOLIC PANEL
Anion gap: 6 (ref 5–15)
BUN: 40 mg/dL — ABNORMAL HIGH (ref 8–23)
CO2: 21 mmol/L — ABNORMAL LOW (ref 22–32)
Calcium: 8.8 mg/dL — ABNORMAL LOW (ref 8.9–10.3)
Chloride: 110 mmol/L (ref 98–111)
Creatinine, Ser: 1.98 mg/dL — ABNORMAL HIGH (ref 0.61–1.24)
GFR, Estimated: 35 mL/min — ABNORMAL LOW (ref >60)
Glucose, Bld: 181 mg/dL — ABNORMAL HIGH (ref 70–99)
Potassium: 5.4 mmol/L — ABNORMAL HIGH (ref 3.5–5.1)
Sodium: 137 mmol/L (ref 135–145)

## 2022-05-19 LAB — CBC
HCT: 38.9 % — ABNORMAL LOW (ref 39.0–52.0)
Hemoglobin: 12.6 g/dL — ABNORMAL LOW (ref 13.0–17.0)
MCH: 29.6 pg (ref 26.0–34.0)
MCHC: 32.4 g/dL (ref 30.0–36.0)
MCV: 91.5 fL (ref 80.0–100.0)
Platelets: 145 10*3/uL — ABNORMAL LOW (ref 150–400)
RBC: 4.25 MIL/uL (ref 4.22–5.81)
RDW: 14.6 % (ref 11.5–15.5)
WBC: 12.2 10*3/uL — ABNORMAL HIGH (ref 4.0–10.5)
nRBC: 0 % (ref 0.0–0.2)

## 2022-05-19 MED ORDER — PANTOPRAZOLE SODIUM 40 MG PO TBEC: 40.0000 mg | DELAYED_RELEASE_TABLET | Freq: Every day | ORAL | 0 refills | Status: DC

## 2022-05-19 MED ORDER — SODIUM ZIRCONIUM CYCLOSILICATE 10 G PO PACK
10.0000 g | PACK | Freq: Three times a day (TID) | ORAL | Status: DC
  Administered 2022-05-19: 10 g via ORAL
  Filled 2022-05-19: qty 1

## 2022-05-19 MED ORDER — ASPIRIN 81 MG PO TBEC: 81.0000 mg | DELAYED_RELEASE_TABLET | Freq: Two times a day (BID) | ORAL | 0 refills | Status: AC

## 2022-05-19 MED ORDER — OXYCODONE HCL 5 MG PO TABS: 5.0000 mg | ORAL_TABLET | Freq: Four times a day (QID) | ORAL | 0 refills | Status: AC | PRN

## 2022-05-19 MED ORDER — LISINOPRIL 10 MG PO TABS: 10.0000 mg | ORAL_TABLET | Freq: Every day | ORAL | Status: DC

## 2022-05-19 NOTE — Care Management Obs Status (Signed)
[facility] NOTIFICATION   Patient Details  Name: Kyle Patel. MRN: [id number] Date of Birth: 26-Oct-1945   Medicare Observation Status Notification Given:  Yes    Boneta Lucks, RN 05/19/2022, 8:31 AM

## 2022-05-19 NOTE — Progress Notes (Signed)
Ambulated in hallway about 50 feet with no difficulty for walk test.

## 2022-05-19 NOTE — Assessment & Plan Note (Addendum)
--   treated with lasix and lokelma and sliding scale insulin

## 2022-05-19 NOTE — Discharge Instructions (Signed)
IMPORTANT INFORMATION: PAY CLOSE ATTENTION   PHYSICIAN DISCHARGE INSTRUCTIONS  Follow with Primary care provider  Patel, Rutwik K, MD  and other consultants as instructed by your Hospitalist Physician  SEEK MEDICAL CARE OR RETURN TO EMERGENCY ROOM IF SYMPTOMS COME BACK, WORSEN OR NEW PROBLEM DEVELOPS   Please note: You were cared for by a hospitalist during your hospital stay. Every effort will be made to forward records to your primary care provider.  You can request that your primary care provider send for your hospital records if they have not received them.  Once you are discharged, your primary care physician will handle any further medical issues. Please note that NO REFILLS for any discharge medications will be authorized once you are discharged, as it is imperative that you return to your primary care physician (or establish a relationship with a primary care physician if you do not have one) for your post hospital discharge needs so that they can reassess your need for medications and monitor your lab values.  Please get a complete blood count and chemistry panel checked by your Primary MD at your next visit, and again as instructed by your Primary MD.  Get Medicines reviewed and adjusted: Please take all your medications with you for your next visit with your Primary MD  Laboratory/radiological data: Please request your Primary MD to go over all hospital tests and procedure/radiological results at the follow up, please ask your primary care provider to get all Hospital records sent to his/her office.  In some cases, they will be blood work, cultures and biopsy results pending at the time of your discharge. Please request that your primary care provider follow up on these results.  If you are diabetic, please bring your blood sugar readings with you to your follow up appointment with primary care.    Please call and make your follow up appointments as soon as possible.    Also Note  the following: If you experience worsening of your admission symptoms, develop shortness of breath, life threatening emergency, suicidal or homicidal thoughts you must seek medical attention immediately by calling 911 or calling your MD immediately  if symptoms less severe.  You must read complete instructions/literature along with all the possible adverse reactions/side effects for all the Medicines you take and that have been prescribed to you. Take any new Medicines after you have completely understood and accpet all the possible adverse reactions/side effects.   Do not drive when taking Pain medications or sleeping medications (Benzodiazepines)  Do not take more than prescribed Pain, Sleep and Anxiety Medications. It is not advisable to combine anxiety,sleep and pain medications without talking with your primary care practitioner  Special Instructions: If you have smoked or chewed Tobacco  in the last 2 yrs please stop smoking, stop any regular Alcohol  and or any Recreational drug use.  Wear Seat belts while driving.  Do not drive if taking any narcotic, mind altering or controlled substances or recreational drugs or alcohol.       

## 2022-05-19 NOTE — Progress Notes (Signed)
SATURATION QUALIFICATIONS: (This note is used to comply with regulatory documentation for home oxygen)  Patient Saturations on Room Air at Rest = 95%  Patient Saturations on Room Air while Ambulating = 87%  Patient Saturations on 3 Liters of oxygen while Ambulating = 92%  Please briefly explain why patient needs home oxygen: Desats on room air

## 2022-05-19 NOTE — Discharge Summary (Signed)
Physician Discharge Summary  Kyle Patel. OVF:[id number] DOB: 11/07/1945 DOA: 05/18/2022  PCP: Lindell Spar, MD OrthoFrench Ana   Admit date: 05/18/2022 Discharge date: 05/19/2022  Admitted From:  Home  Disposition: Home with [facility]   Recommendations for Outpatient Follow-up:  Follow up with PCP in 1 weeks Follow up with orthopedics in 2 weeks Continue using incentive spirometer for next 7 days  Home Health:  PT   Discharge Condition: STABLE   CODE STATUS: FULL DIET: resume prior home diet    Brief Hospitalization Summary: Please see all hospital notes, images, labs for full details of the hospitalization. ADMISSION HPI:  76 y.o. male with medical history significant for  HTN, CAD, CKD, COPD patient was brought to the ED reports of low O2 sats. Patient was admitted 12/22 to 12/23, for right total knee arthroplasty by Dr. French Ana, which was done 11/22, he was discharged yesterday.    Physical therapy came to work with him today,and he was found to be hypoxic with O2 sats in the 80s on room air.  EMS was called, they placed him on 3 L.   Patient denies any respiratory symptoms, he has not had any difficulty breathing, no cough, no chest pain.  He has swelling to his right lower extremity.  At this time his only complaint is significant pain in his right lower extremity, as he had some challenges picking up his prescription on discharge.  Quit smoking cigarettes about 40 years ago.   ED Course: O2 sats dropping to 88% on room air.  Placed on 4 L nasal cannula.  Sats greater than 94%.  Heart rate 68-74.  Temperature 98.3.  Respirate rate 14-26.  Blood pressure systolic 841Y to 606T.  D-dimer 1.9.  WBC 15.6.  BNP 696.  VBG shows pH of 7.4, pCO2 of 32.   CT a chest negative for PE, suggest possible bronchitis.Hospitalist to admit for acute hypoxic respiratory failure    HOSPITAL COURSE BY PROBLEM   Assessment and Plan: * Acute respiratory failure with hypoxia (HCC) O2 sats  dropping to 88% on room air here.  WEAN oxygen as able.  Underwent spinal anesthesia for recent right total knee arthroplasty- 12/22.  Faint bilateral wheezing.  Denies dyspnea or other respiratory complaints.  BNP - 696. CTA chest negative for PE, suggest possible bronchitis. -suspect atelectasis from recent surgery; he responded well to using incentive spirometry at bedside;  he has no symptoms of SOB. Will do a home O2 eval screen prior to DC and provide home oxygen as needed. -he is stable to discharge home today  Hyperkalemia -- treated with lasix and lokelma and sliding scale insulin   COPD with acute exacerbation ([place]) Resenting with hypoxia without significant respiratory complaints. Hx of COPD, quit 40 years ago.  Faint bilateral lower lung wheezing. 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 Earlie Server at [facility].  On discharge- he was given -sequential compression devices, early ambulation, and ASA for DVT prophylaxis.  Has had challenges picking up his prescription from the pharmacy. -Oxycodone 5 mg as needed -WBC 15.6, possibly stress reaction, trend  CAD (coronary artery disease) Stable.  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   Prediabetes - resume home meds at discharge  Idiopathic peripheral neuropathy -stable on home meds  CKD (chronic kidney disease) CKD 3 B.  Cr stable : 2. -stable; resume all home meds -Resume home oral bicarb, calcitriol  Hypertension Stable. -Resume Norvasc, Lasix '20mg'$   in a.m.   Discharge Diagnoses:  Principal Problem:   Acute respiratory failure with hypoxia (HCC) Active Problems:   Hypertension   CKD (chronic kidney disease)   Idiopathic peripheral neuropathy   Prediabetes   CAD (coronary artery disease)   S/P total knee arthroplasty, right   COPD with acute exacerbation (HCC)   Hyperkalemia   Discharge Instructions:  Allergies as of 05/19/2022       Reactions   Levaquin [levofloxacin]  Itching   Statins    Myalgias        Medication List     STOP taking these medications    acetaminophen 650 MG CR tablet Commonly known as: Tylenol 8 Hour       TAKE these medications    amLODipine 10 MG tablet Commonly known as: NORVASC Take 1 tablet (10 mg total) by mouth daily.   aspirin EC 81 MG tablet Take 1 tablet (81 mg total) by mouth 2 (two) times daily. Swallow whole.   calcitRIOL 0.25 MCG capsule Commonly known as: ROCALTROL Take 0.25 mcg by mouth every Monday, Wednesday, and Friday.   CENTRUM PO Take 1 tablet by mouth every morning.   Coenzyme Q10-Vitamin E 100-150 MG-UNIT Caps Take 1 capsule by mouth at bedtime.   dapagliflozin propanediol 5 MG Tabs tablet Commonly known as: FARXIGA Take 5 mg by mouth See admin instructions. Take 5 mg by mouth on Monday's and Thursday's.   ezetimibe 10 MG tablet Commonly known as: ZETIA TAKE (1) TABLET BY MOUTH ONCE DAILY. What changed: See the new instructions.   furosemide 20 MG tablet Commonly known as: LASIX TAKE 1 TABLET BY MOUTH ONCE DAILY.   lisinopril 10 MG tablet Commonly known as: ZESTRIL Take 1 tablet (10 mg total) by mouth daily. Start taking on: May 21, 2022 What changed: These instructions start on May 21, 2022. If you are unsure what to do until then, ask your doctor or other care provider.   metoprolol succinate 50 MG 24 hr tablet Commonly known as: TOPROL-XL Take 50 mg by mouth daily.   nitroGLYCERIN 0.4 MG SL tablet Commonly known as: NITROSTAT PLACE 1 TAB UNDER TONGUE EVERY 5 MIN IF NEEDED FOR CHEST PAIN. MAY USE 3 TIMES.NO RELIEF CALL 911. What changed:  how much to take how to take this when to take this reasons to take this additional instructions   Osteo Bi-Flex Adv Joint Shield Tabs Take 1 tablet by mouth daily.   oxyCODONE 5 MG immediate release tablet Commonly known as: Oxy IR/ROXICODONE Take 1 tablet (5 mg total) by mouth every 6 (six) hours as needed for up  to 3 days for severe pain.   pantoprazole 40 MG tablet Commonly known as: Protonix Take 1 tablet (40 mg total) by mouth daily.   pregabalin 200 MG capsule Commonly known as: LYRICA Take 1 capsule (200 mg total) by mouth 2 (two) times daily.   rosuvastatin 10 MG tablet Commonly known as: CRESTOR TAKE (1) TABLET BY MOUTH ONCE DAILY. What changed: See the new instructions.   sodium bicarbonate 650 MG tablet Take 650 mg by mouth 3 (three) times daily.               Durable Medical Equipment  (From admission, onward)           Start     Ordered   05/19/22 0911  For home use only DME oxygen  Once       Question Answer Comment  Length of Need Lifetime  Mode or (Route) Nasal cannula   Liters per Minute 2   Frequency Continuous (stationary and portable oxygen unit needed)   Oxygen conserving device Yes   Oxygen delivery system Gas      05/19/22 0910            Follow-up Information     Lindell Spar, MD. Schedule an appointment as soon as possible for a visit in 1 week(s).   Specialty: Internal Medicine Why: Hospital Follow Up Contact information: [address] [place] [address] [phone]         Earlie Server, MD. Schedule an appointment as soon as possible for a visit in 2 week(s).   Specialty: Orthopedic Surgery Why: Hospital Follow Up Contact information: [place] [address] [phone]                Allergies  Allergen Reactions   Levaquin [Levofloxacin] Itching   Statins     Myalgias   Allergies as of 05/19/2022       Reactions   Levaquin [levofloxacin] Itching   Statins    Myalgias        Medication List     STOP taking these medications    acetaminophen 650 MG CR tablet Commonly known as: Tylenol 8 Hour       TAKE these medications    amLODipine 10 MG tablet Commonly known as: NORVASC Take 1 tablet (10 mg total) by mouth daily.   aspirin EC 81 MG  tablet Take 1 tablet (81 mg total) by mouth 2 (two) times daily. Swallow whole.   calcitRIOL 0.25 MCG capsule Commonly known as: ROCALTROL Take 0.25 mcg by mouth every Monday, Wednesday, and Friday.   CENTRUM PO Take 1 tablet by mouth every morning.   Coenzyme Q10-Vitamin E 100-150 MG-UNIT Caps Take 1 capsule by mouth at bedtime.   dapagliflozin propanediol 5 MG Tabs tablet Commonly known as: FARXIGA Take 5 mg by mouth See admin instructions. Take 5 mg by mouth on Monday's and Thursday's.   ezetimibe 10 MG tablet Commonly known as: ZETIA TAKE (1) TABLET BY MOUTH ONCE DAILY. What changed: See the new instructions.   furosemide 20 MG tablet Commonly known as: LASIX TAKE 1 TABLET BY MOUTH ONCE DAILY.   lisinopril 10 MG tablet Commonly known as: ZESTRIL Take 1 tablet (10 mg total) by mouth daily. Start taking on: May 21, 2022 What changed: These instructions start on May 21, 2022. If you are unsure what to do until then, ask your doctor or other care provider.   metoprolol succinate 50 MG 24 hr tablet Commonly known as: TOPROL-XL Take 50 mg by mouth daily.   nitroGLYCERIN 0.4 MG SL tablet Commonly known as: NITROSTAT PLACE 1 TAB UNDER TONGUE EVERY 5 MIN IF NEEDED FOR CHEST PAIN. MAY USE 3 TIMES.NO RELIEF CALL 911. What changed:  how much to take how to take this when to take this reasons to take this additional instructions   Osteo Bi-Flex Adv Joint Shield Tabs Take 1 tablet by mouth daily.   oxyCODONE 5 MG immediate release tablet Commonly known as: Oxy IR/ROXICODONE Take 1 tablet (5 mg total) by mouth every 6 (six) hours as needed for up to 3 days for severe pain.   pantoprazole 40 MG tablet Commonly known as: Protonix Take 1 tablet (40 mg total) by mouth daily.   pregabalin 200 MG capsule Commonly known as: LYRICA Take 1 capsule (200 mg total) by mouth 2 (  two) times daily.   rosuvastatin 10 MG tablet Commonly known as: CRESTOR TAKE (1) TABLET BY  MOUTH ONCE DAILY. What changed: See the new instructions.   sodium bicarbonate 650 MG tablet Take 650 mg by mouth 3 (three) times daily.               Durable Medical Equipment  (From admission, onward)           Start     Ordered   05/19/22 0911  For home use only DME oxygen  Once       Question Answer Comment  Length of Need Lifetime   Mode or (Route) Nasal cannula   Liters per Minute 2   Frequency Continuous (stationary and portable oxygen unit needed)   Oxygen conserving device Yes   Oxygen delivery system Gas      05/19/22 0910            Procedures/Studies: CT Angio Chest PE W and/or Wo Contrast  Result Date: 05/18/2022 CLINICAL DATA:  Pulmonary embolism (PE) suspected, high prob EXAM: CT ANGIOGRAPHY CHEST WITH CONTRAST TECHNIQUE: Multidetector CT imaging of the chest was performed using the standard protocol during bolus administration of intravenous contrast. Multiplanar CT image reconstructions and MIPs were obtained to evaluate the vascular anatomy. RADIATION DOSE REDUCTION: This exam was performed according to the departmental dose-optimization program which includes automated exposure control, adjustment of the mA and/or kV according to patient size and/or use of iterative reconstruction technique. CONTRAST:  33m OMNIPAQUE IOHEXOL 350 MG/ML SOLN COMPARISON:  CT 11/10/2011 FINDINGS: Cardiovascular: Satisfactory opacification of the pulmonary arteries to the segmental level. No evidence of pulmonary embolism. Thoracic aorta is nonaneurysmal. Scattered atherosclerotic vascular calcifications of the aorta and coronary arteries. Normal heart size. No pericardial effusion. Mediastinum/Nodes: Multiple mildly enlarged mediastinal lymph nodes including right paratracheal node measuring 12 mm (series 5, image 70), precarinal node measuring 14 mm (series 5, image 86), and AP window node measuring 13 mm (series 5, image 93). Bilateral hilar lymph nodes are upper limits of  normal in size. No axillary lymphadenopathy. Thyroid, trachea, and esophagus demonstrate no significant findings. Lungs/Pleura: Slightly low lung volumes with hypoventilatory changes including mild dependent atelectasis. Mild bronchial wall thickening. No focal airspace consolidation. No pleural effusion or pneumothorax. Upper Abdomen: No acute abnormality. Musculoskeletal: Mild gynecomastia, right greater than left. No acute or significant osseous findings. Review of the MIP images confirms the above findings. IMPRESSION: 1. No evidence of pulmonary embolism. 2. Mild bronchial wall thickening, which can be seen in the setting of bronchitis. No focal airspace consolidation. 3. Multiple mildly enlarged mediastinal lymph nodes, which may be reactive. 4. Aortic and coronary artery atherosclerosis (ICD10-I70.0). Electronically Signed   By: NDavina PokeD.O.   On: 05/18/2022 15:23   DG Chest Port 1 View  Result Date: 05/18/2022 CLINICAL DATA:  Hypoxia.  Status post right knee arthroplasty. EXAM: PORTABLE CHEST 1 VIEW COMPARISON:  04/24/2017 FINDINGS: Mild cardiac enlargement, stable. The costophrenic angles are indistinct. Asymmetric opacity in the left base may represent airspace disease or atelectasis. Pulmonary vascular congestion. IMPRESSION: 1. Asymmetric opacity in the left base may represent airspace disease or atelectasis. 2. Pulmonary vascular congestion.  T 3. The CP angles are indistinct. Small pleural effusions are not excluded. Electronically Signed   By: TKerby MoorsM.D.   On: 05/18/2022 11:12     Subjective: Pt reports that he is not having any shortness of breath.  He wants to go home.  He has home health  PT.  No complaints.     Discharge Exam: Vitals:   05/19/22 0223 05/19/22 0537  BP: 128/71 127/63  Pulse: 76 71  Resp: 20 18  Temp: 98.6 F (37 C) 98.3 F (36.8 C)  SpO2: 94% 92%   Vitals:   05/18/22 2117 05/18/22 2135 05/19/22 0223 05/19/22 0537  BP:  (!) 154/66 128/71  127/63  Pulse:  97 76 71  Resp:  '20 20 18  '$ Temp:  (!) 101.1 F (38.4 C) 98.6 F (37 C) 98.3 F (36.8 C)  TempSrc:  Oral Oral   SpO2: 93% 92% 94% 92%  Weight:      Height:       General: Pt is alert, awake, not in acute distress Cardiovascular: RRR, S1/S2 +, no rubs, no gallops Respiratory: CTA bilaterally, no wheezing, no rhonchi Abdominal: Soft, NT, ND, bowel sounds + Extremities: no edema, no cyanosis   The results of significant diagnostics from this hospitalization (including imaging, microbiology, ancillary and laboratory) are listed below for reference.     Microbiology: Recent Results (from the past 240 hour(s))  Resp panel by RT-PCR (RSV, Flu A&B, Covid) Anterior Nasal Swab     Status: None   Collection Time: 05/18/22 10:35 AM   Specimen: Anterior Nasal Swab  Result Value Ref Range Status   SARS Coronavirus 2 by RT PCR NEGATIVE NEGATIVE Final    Comment: (NOTE) SARS-CoV-2 target nucleic acids are NOT DETECTED.  The SARS-CoV-2 RNA is generally detectable in upper respiratory specimens during the acute phase of infection. The lowest concentration of SARS-CoV-2 viral copies this assay can detect is 138 copies/mL. A negative result does not preclude SARS-Cov-2 infection and should not be used as the sole basis for treatment or other patient management decisions. A negative result may occur with  improper specimen collection/handling, submission of specimen other than nasopharyngeal swab, presence of viral mutation(s) within the areas targeted by this assay, and inadequate number of viral copies(<138 copies/mL). A negative result must be combined with clinical observations, patient history, and epidemiological information. The expected result is Negative.  Fact Sheet for Patients:  EntrepreneurPulse.com.au  Fact Sheet for Healthcare Providers:  IncredibleEmployment.be  This test is no t yet approved or cleared by the [place] FDA and  has been authorized for detection and/or diagnosis of SARS-CoV-2 by FDA under an Emergency Use Authorization (EUA). This EUA will remain  in effect (meaning this test can be used) for the duration of the COVID-19 declaration under Section 564(b)(1) of the Act, 21 U.S.C.section 360bbb-3(b)(1), unless the authorization is terminated  or revoked sooner.       Influenza A by PCR NEGATIVE NEGATIVE Final   Influenza B by PCR NEGATIVE NEGATIVE Final    Comment: (NOTE) The Xpert Xpress SARS-CoV-2/FLU/RSV plus assay is intended as an aid in the diagnosis of influenza from Nasopharyngeal swab specimens and should not be used as a sole basis for treatment. Nasal washings and aspirates are unacceptable for Xpert Xpress SARS-CoV-2/FLU/RSV testing.  Fact Sheet for Patients: EntrepreneurPulse.com.au  Fact Sheet for Healthcare Providers: IncredibleEmployment.be  This test is not yet approved or cleared by the [place] FDA and has been authorized for detection and/or diagnosis of SARS-CoV-2 by FDA under an Emergency Use Authorization (EUA). This EUA will remain in effect (meaning this test can be used) for the duration of the COVID-19 declaration under Section 564(b)(1) of the Act, 21 U.S.C. section 360bbb-3(b)(1), unless the authorization is terminated or revoked.     Resp Syncytial  Virus by PCR NEGATIVE NEGATIVE Final    Comment: (NOTE) Fact Sheet for Patients: EntrepreneurPulse.com.au  Fact Sheet for Healthcare Providers: IncredibleEmployment.be  This test is not yet approved or cleared by the [place] FDA and has been authorized for detection and/or diagnosis of SARS-CoV-2 by FDA under an Emergency Use Authorization (EUA). This EUA will remain in effect (meaning this test can be used) for the duration of the COVID-19 declaration under Section 564(b)(1) of the Act, 21 U.S.C. section  360bbb-3(b)(1), unless the authorization is terminated or revoked.  Performed at [facility], [address], [place], [place] [address]      Labs: BNP (last 3 results) Recent Labs    05/18/22 1116  BNP 295.2*   Basic Metabolic Panel: Recent Labs  Lab 05/18/22 1116 05/19/22 0435  NA 135 137  K 5.3* 5.4*  CL 109 110  CO2 19* 21*  GLUCOSE 120* 181*  BUN 46* 40*  CREATININE 2.04* 1.98*  CALCIUM 8.4* 8.8*  MG 2.2  --    Liver Function Tests: Recent Labs  Lab 05/18/22 1116  AST 30  ALT 12  ALKPHOS 54  BILITOT 0.6  PROT 7.6  ALBUMIN 3.5   No results for input(s): "LIPASE", "AMYLASE" in the last 168 hours. No results for input(s): "AMMONIA" in the last 168 hours. CBC: Recent Labs  Lab 05/18/22 1116 05/19/22 0435  WBC 15.6* 12.2*  NEUTROABS 10.8*  --   HGB 12.5* 12.6*  HCT 39.6 38.9*  MCV 94.1 91.5  PLT 146* 145*   Cardiac Enzymes: No results for input(s): "CKTOTAL", "CKMB", "CKMBINDEX", "TROPONINI" in the last 168 hours. BNP: Invalid input(s): "POCBNP" CBG: Recent Labs  Lab 05/18/22 2151 05/19/22 0744  GLUCAP 158* 164*   D-Dimer Recent Labs    05/18/22 1116  DDIMER 1.91*   Hgb A1c No results for input(s): "HGBA1C" in the last 72 hours. Lipid Profile No results for input(s): "CHOL", "HDL", "LDLCALC", "TRIG", "CHOLHDL", "LDLDIRECT" in the last 72 hours. Thyroid function studies No results for input(s): "TSH", "T4TOTAL", "T3FREE", "THYROIDAB" in the last 72 hours.  Invalid input(s): "FREET3" Anemia work up No results for input(s): "VITAMINB12", "FOLATE", "FERRITIN", "TIBC", "IRON", "RETICCTPCT" in the last 72 hours. Urinalysis    Component Value Date/Time   COLORURINE YELLOW 04/24/2017 0412   APPEARANCEUR CLEAR 04/24/2017 0412   LABSPEC 1.018 04/24/2017 0412   PHURINE 5.0 04/24/2017 0412   GLUCOSEU NEGATIVE 04/24/2017 0412   HGBUR SMALL (A) 04/24/2017 0412   BILIRUBINUR NEGATIVE 04/24/2017 0412   KETONESUR NEGATIVE 04/24/2017 0412    PROTEINUR 100 (A) 04/24/2017 0412   UROBILINOGEN 1.0 11/12/2011 1357   NITRITE NEGATIVE 04/24/2017 0412   LEUKOCYTESUR NEGATIVE 04/24/2017 0412   Sepsis Labs Recent Labs  Lab 05/18/22 1116 05/19/22 0435  WBC 15.6* 12.2*   Microbiology Recent Results (from the past 240 hour(s))  Resp panel by RT-PCR (RSV, Flu A&B, Covid) Anterior Nasal Swab     Status: None   Collection Time: 05/18/22 10:35 AM   Specimen: Anterior Nasal Swab  Result Value Ref Range Status   SARS Coronavirus 2 by RT PCR NEGATIVE NEGATIVE Final    Comment: (NOTE) SARS-CoV-2 target nucleic acids are NOT DETECTED.  The SARS-CoV-2 RNA is generally detectable in upper respiratory specimens during the acute phase of infection. The lowest concentration of SARS-CoV-2 viral copies this assay can detect is 138 copies/mL. A negative result does not preclude SARS-Cov-2 infection and should not be used as the sole basis for treatment or other patient management  decisions. A negative result may occur with  improper specimen collection/handling, submission of specimen other than nasopharyngeal swab, presence of viral mutation(s) within the areas targeted by this assay, and inadequate number of viral copies(<138 copies/mL). A negative result must be combined with clinical observations, patient history, and epidemiological information. The expected result is Negative.  Fact Sheet for Patients:  EntrepreneurPulse.com.au  Fact Sheet for Healthcare Providers:  IncredibleEmployment.be  This test is no t yet approved or cleared by the [place] FDA and  has been authorized for detection and/or diagnosis of SARS-CoV-2 by FDA under an Emergency Use Authorization (EUA). This EUA will remain  in effect (meaning this test can be used) for the duration of the COVID-19 declaration under Section 564(b)(1) of the Act, 21 U.S.C.section 360bbb-3(b)(1), unless the authorization is terminated  or  revoked sooner.       Influenza A by PCR NEGATIVE NEGATIVE Final   Influenza B by PCR NEGATIVE NEGATIVE Final    Comment: (NOTE) The Xpert Xpress SARS-CoV-2/FLU/RSV plus assay is intended as an aid in the diagnosis of influenza from Nasopharyngeal swab specimens and should not be used as a sole basis for treatment. Nasal washings and aspirates are unacceptable for Xpert Xpress SARS-CoV-2/FLU/RSV testing.  Fact Sheet for Patients: EntrepreneurPulse.com.au  Fact Sheet for Healthcare Providers: IncredibleEmployment.be  This test is not yet approved or cleared by the [place] FDA and has been authorized for detection and/or diagnosis of SARS-CoV-2 by FDA under an Emergency Use Authorization (EUA). This EUA will remain in effect (meaning this test can be used) for the duration of the COVID-19 declaration under Section 564(b)(1) of the Act, 21 U.S.C. section 360bbb-3(b)(1), unless the authorization is terminated or revoked.     Resp Syncytial Virus by PCR NEGATIVE NEGATIVE Final    Comment: (NOTE) Fact Sheet for Patients: EntrepreneurPulse.com.au  Fact Sheet for Healthcare Providers: IncredibleEmployment.be  This test is not yet approved or cleared by the [place] FDA and has been authorized for detection and/or diagnosis of SARS-CoV-2 by FDA under an Emergency Use Authorization (EUA). This EUA will remain in effect (meaning this test can be used) for the duration of the COVID-19 declaration under Section 564(b)(1) of the Act, 21 U.S.C. section 360bbb-3(b)(1), unless the authorization is terminated or revoked.  Performed at [facility], [address], [place], [place] [address]    Time coordinating discharge: 35 mins  SIGNED:  Irwin Brakeman, MD  [facility] 05/19/2022, 9:56 AM How to contact the [facility] Attending or Consulting provider [place] or covering provider during after hours  [place], for this patient?  Check the care team in [facility] and look for a) attending/consulting [facility] provider listed and b) the [facility] team listed Log into [email or web address] and use [place]'s universal password to access. If you do not have the password, please contact the hospital operator. Locate the [facility] provider you are looking for under [facility] and page to a number that you can be directly reached. If you still have difficulty reaching the provider, please page the [facility] (Director on Call) for the Hospitalists listed on amion for assistance.

## 2022-05-19 NOTE — TOC Transition Note (Signed)
Transition of Care [facility]) - CM/SW Discharge Note   Patient Details  Name: Kyle Patel. MRN: [id number] Date of Birth: [birth year]  Transition of Care [facility]) CM/SW Contact:  Shade Flood, LCSW Phone Number: 05/19/2022, 11:30 AM   Clinical Narrative:     Pt stable for dc per MD. Pt needs home O2. Met with pt to assess and review dc planning. CMS provider options reviewed. Referred to [facility] for Home o2. Pt already has other DME and HH arranged through orthopedics as pt had recent TKA.   [facility] [place] [facility] at [facility].  No further TOC needs.  Expected Discharge Plan: [place] Barriers to Discharge: Barriers Resolved   Patient Goals and CMS Choice Patient states their goals for this hospitalization and ongoing recovery are:: return home CMS Medicare.gov Compare Post Acute Care list provided to:: Patient Choice offered to / list presented to : Patient  Expected Discharge Plan and Services Expected Discharge Plan: [place] In-house Referral: Clinical Social Work   Post Acute Care Choice: Durable Medical Equipment Living arrangements for the past 2 months: Single Family Home Expected Discharge Date: 05/19/22               DME Arranged: Oxygen DME Agency: [employer] [facility] Date DME Agency Contacted: 05/19/22   Representative spoke with at DME Agency: Caryl Pina            Prior Living Arrangements/Services Living arrangements for the past 2 months: Single Family Home Lives with:: Self Patient language and need for interpreter reviewed:: Yes Do you feel safe going back to the place where you live?: Yes      Need for Family Participation in Patient Care: No (Comment) Care giver support system in place?: Yes (comment) Current home services: DME, Home PT Criminal Activity/Legal Involvement Pertinent to Current Situation/Hospitalization: No - Comment as needed  Activities of Daily Living      Permission  Sought/Granted Permission sought to share information with : Facility Art therapist granted to share information with : Yes, Verbal Permission Granted     Permission granted to share info w AGENCY: [employer]        Emotional Assessment Appearance:: Appears stated age Attitude/Demeanor/Rapport: Engaged Affect (typically observed): Pleasant Orientation: : Oriented to Self, Oriented to Place, Oriented to  Time, Oriented to Situation Alcohol / Substance Use: Not Applicable Psych Involvement: No (comment)  Admission diagnosis:  Acute respiratory failure with hypoxia ([place]) [J96.01] Patient Active Problem List   Diagnosis Date Noted   Hyperkalemia 05/19/2022   Acute respiratory failure with hypoxia ([place]) 05/18/2022   S/P total knee arthroplasty, right 05/18/2022   COPD with acute exacerbation ([place]) 05/18/2022   Primary localized osteoarthritis of right knee 05/16/2022   Encounter for general adult medical examination with abnormal findings 05/12/2022   Preop examination 02/21/2022   Leg swelling 02/21/2022   CAD (coronary artery disease) 09/02/2021   Prediabetes 06/15/2020   Hyperlipidemia 04/27/2017   NSTEMI (non-ST elevated myocardial infarction) ([place]) 04/24/2017   History of colonic polyps    Diverticulosis of colon without hemorrhage    Family history of colon cancer 06/11/2015   Chronic foot pain 03/08/2015   Idiopathic peripheral neuropathy 05/31/2014   Lumbar radiculopathy 11/21/2013   Pain in joint, shoulder region 07/21/2012   Nocturnal muscle cramps 01/19/2012   Hypertension 11/10/2011   CKD (chronic kidney disease) 11/10/2011   Osteoarthritis of knee 09/16/2011   PCP:  Ihor Dow  Raliegh Ip, MD Pharmacy:   [facility] [place] [place] [address] [place] [address] Phone: [phone] Fax: [phone]  [facility] [phone] - [place], [place] [address] [place] [address] Phone: [phone] Fax: [phone]     Social Determinants of Health (SDOH) Interventions    Readmission Risk Interventions     No data to display           Final next level of care: Home w Home Health Services Barriers to Discharge: Barriers Resolved   Patient Goals and CMS Choice CMS Medicare.gov Compare Post Acute Care list provided to:: Patient Choice offered to / list presented to : Patient  Discharge Placement                         Discharge Plan and Services Additional resources added to the After Visit Summary for   In-house Referral: Clinical Social Work   Post Acute Care Choice: Durable Medical Equipment          DME Arranged: Oxygen DME Agency: [facility] Date DME Agency Contacted: 05/19/22   Representative spoke with at DME Agency: [place] Determinants of Health ([place]) Interventions SDOH Screenings   Food Insecurity: Unknown (05/16/2022)  Housing: Low Risk  (05/16/2022)  Transportation Needs: No Transportation Needs (05/16/2022)  Utilities: Not At Risk (05/16/2022)  Alcohol Screen: Low Risk  (10/29/2021)  Depression (PHQ2-9): Low Risk  (05/12/2022)  Financial Resource Strain: Low Risk  (10/29/2021)  Physical Activity: Inactive (10/29/2021)  Social Connections: Socially Integrated (10/29/2021)  Stress: No Stress Concern Present (10/29/2021)  Tobacco Use: Medium Risk (05/16/2022)     Readmission Risk Interventions     No data to display

## 2022-05-19 NOTE — Progress Notes (Signed)
IV removed and discharge instructions reviewed.  Given hard script for oxycodone and [facility] [place] [facility] and they will be able to fill it. O2 tank delivered to room and wife driving patient home.  Follow up appt made with ortho for knee and to call prima;ry MD for follow up.

## 2022-05-19 NOTE — Assessment & Plan Note (Signed)
-  stable on home meds

## 2022-05-19 NOTE — TOC Progression Note (Signed)
  Transition of Care [facility]) Screening Note   Patient Details  Name: Kyle Patel. Date of Birth: [birth year]   Transition of Care [facility]) CM/SW Contact:    Boneta Lucks, RN Phone Number: 05/19/2022, 8:30 AM    Transition of Care Department [facility]) has reviewed patient and no TOC needs have been identified at this time. We will continue to monitor patient advancement through interdisciplinary progression rounds. If new patient transition needs arise, please place a TOC consult.     Expected Discharge Plan: Home/Self Care Barriers to Discharge: Continued Medical Work up  Expected Discharge Plan and Services       Living arrangements for the past 2 months: Single Family Home                    Social Determinants of Health (SDOH) Interventions SDOH Screenings   Food Insecurity: Unknown (05/16/2022)  Housing: Low Risk  (05/16/2022)  Transportation Needs: No Transportation Needs (05/16/2022)  Utilities: Not At Risk (05/16/2022)  Alcohol Screen: Low Risk  (10/29/2021)  Depression (PHQ2-9): Low Risk  (05/12/2022)  Financial Resource Strain: Low Risk  (10/29/2021)  Physical Activity: Inactive (10/29/2021)  Social Connections: Socially Integrated (10/29/2021)  Stress: No Stress Concern Present (10/29/2021)  Tobacco Use: Medium Risk (05/16/2022)

## 2022-05-20 ENCOUNTER — Telehealth: Payer: Self-pay

## 2022-05-20 LAB — HEMOGLOBIN A1C
Hgb A1c MFr Bld: 6.7 % — ABNORMAL HIGH (ref 4.8–5.6)
Mean Plasma Glucose: 146 mg/dL

## 2022-05-20 NOTE — Telephone Encounter (Signed)
Transition Care Management Follow-up Telephone Call Date of discharge and from where: 05/18/22 How have you been since you were released from the hospital? well Any questions or concerns? No Had knee replacement 05/16/22  Items Reviewed: Did the pt receive and understand the discharge instructions provided? Yes  Medications obtained and verified? Yes  Other? No  Any new allergies since your discharge? No  Dietary orders reviewed? Yes Do you have support at home? Yes   Home Care and Equipment/Supplies: Were home health services ordered? no If so, what is the name of the agency? NA  Has the agency set up a time to come to the patient's home? no Were any new equipment or medical supplies ordered?  No What is the name of the medical supply agency? NA Were you able to get the supplies/equipment? not applicable Do you have any questions related to the use of the equipment or supplies? No  Functional Questionnaire: (I = Independent and D = Dependent) ADLs: I  Bathing/Dressing- I  Meal Prep- I  Eating- I  Maintaining continence- I  Transferring/Ambulation- I  Managing Meds- I  Follow up appointments reviewed:  PCP Hospital f/u appt confirmed? Yes  Scheduled to see Dr. Posey Pronto on 1/16 @ 2:40. [place] f/u appt confirmed? No   Are transportation arrangements needed? No  If their condition worsens, is the pt aware to call PCP or go to the Emergency Dept.? Yes Was the patient provided with contact information for the PCP's office or ED? Yes Was to pt encouraged to call back with questions or concerns? Yes

## 2022-05-21 ENCOUNTER — Encounter (HOSPITAL_COMMUNITY): Payer: Self-pay | Admitting: Orthopedic Surgery

## 2022-05-27 ENCOUNTER — Telehealth: Payer: Self-pay | Admitting: Internal Medicine

## 2022-05-27 NOTE — Telephone Encounter (Signed)
Patient cal,led in regard to  oxygen tank.  Patient was dc from hospital with oxygen tank and never used it. Was informed by company that they need letter from provider to have tank picked up.  Patient wants a call back.

## 2022-05-29 ENCOUNTER — Encounter: Payer: Self-pay | Admitting: Internal Medicine

## 2022-05-29 ENCOUNTER — Ambulatory Visit: Payer: Medicare Other | Admitting: Internal Medicine

## 2022-05-29 VITALS — BP 102/62 | HR 85 | Ht 69.5 in | Wt 199.4 lb

## 2022-05-29 DIAGNOSIS — Z09 Encounter for follow-up examination after completed treatment for conditions other than malignant neoplasm: Secondary | ICD-10-CM

## 2022-05-29 DIAGNOSIS — E1122 Type 2 diabetes mellitus with diabetic chronic kidney disease: Secondary | ICD-10-CM | POA: Diagnosis not present

## 2022-05-29 DIAGNOSIS — J9601 Acute respiratory failure with hypoxia: Secondary | ICD-10-CM | POA: Diagnosis not present

## 2022-05-29 DIAGNOSIS — N1832 Chronic kidney disease, stage 3b: Secondary | ICD-10-CM | POA: Diagnosis not present

## 2022-05-29 NOTE — Assessment & Plan Note (Signed)
Hospital chart reviewed, including discharge summary Had acute hypoxic respiratory failure, resolved now Had AKI on CKD and hyperkalemia, could be due to dehydration, recheck BMP in the next week

## 2022-05-29 NOTE — Assessment & Plan Note (Signed)
Lab Results  Component Value Date   HGBA1C 6.7 (H) 05/19/2022    New onset, has history of prediabetes, could be worse due to IV steroids On Farxiga twice in a week Advised to follow diabetic diet On statin and ACEi F/u CMP and lipid panel Diabetic foot exam: Today Diabetic eye exam: Advised to follow up with Ophthalmology for diabetic eye exam

## 2022-05-29 NOTE — Assessment & Plan Note (Signed)
Had AKI on CKD Had hyperkalemia, treated with Lasix and insulin Planned to get repeat BMP in the next week and see nephrologist On sodium bicarb, ACEi and Lasix On Farxiga for proteinuria

## 2022-05-29 NOTE — Progress Notes (Signed)
Established Patient Office Visit  Subjective:  Patient ID: Kyle Emily., male    DOB: [birth year]  Age: 77 y.o. MRN: [id number]  CC:  Chief Complaint  Patient presents with   [facility] follow up for knee surgery     HPI Kyle Schnoor. is a 77 y.o. male with past medical history of CAD, HTN, CKD, peripheral neuropathy and HLD who presents for f/u after recent hospitalization.  He was admitted on 05/18/2022 for acute hypoxic respiratory failure.  He had right TKA on 12/22 and was found to be hypoxic on the next day of discharge while doing PT.  CT chest was negative for PE.  He was found to have acute bronchitis and was given IV steroids.  He was weaned off of oxygen during the hospitalization.  He has not required home O2, although he did get it.  He has been using incentive spirometry as well.  He has resumed home PT.     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   Hyperlipidemia    Pulmonary embolism ([place]) 10/2011    Past Surgical History:  Procedure Laterality Date   ABDOMINAL SURGERY     colon polyp removed 2007- sepsis, coma for 4 months per pt   BIOPSY  12/24/2020   Procedure: BIOPSY;  Surgeon: Eloise Harman, DO;  [facility];  Service: Endoscopy;;   CATARACT EXTRACTION W/PHACO Right 08/19/2021   Procedure: CATARACT EXTRACTION PHACO AND INTRAOCULAR LENS PLACEMENT ([place]);  Surgeon: Baruch Goldmann, MD;  Location: [facility] ORS;  Service: Ophthalmology;  Laterality: Right;  CDE: 7.02    CATARACT EXTRACTION W/PHACO Left 09/30/2021   Procedure: CATARACT EXTRACTION  PHACO AND INTRAOCULAR LENS PLACEMENT ([facility]);  Surgeon: Baruch Goldmann, MD;  Location: [facility] ORS;  Service: Ophthalmology;  Laterality: Left;  CDE 4.61   COLONOSCOPY  03/22/2010   RMR: 1. Normal rectum 2. Diminutive polyp at the mouth of the ileocecal valve  status post cold biopsy removal. Remaideer of the colonic mucosa and terminal ileum mucosa appeared normal.    COLONOSCOPY N/A 06/25/2015   Procedure: COLONOSCOPY;  Surgeon: Daneil Dolin, MD;  Location: [facility];  Service: Endoscopy;  Laterality: N/A;  200-rescheduled 1/30 per [place]    COLONOSCOPY WITH PROPOFOL N/A 12/24/2020   Procedure: COLONOSCOPY WITH PROPOFOL;  Surgeon: Eloise Harman, DO;  Location: [facility];  Service: Endoscopy;  Laterality: N/A;  ASA III / 8:00   CORONARY BALLOON ANGIOPLASTY N/A 04/24/2017   Procedure: CORONARY BALLOON ANGIOPLASTY;  Surgeon: Jettie Booze, MD;  Location: [place] CV LAB;  Service: Cardiovascular;  Laterality: N/A;   LEFT HEART CATH AND CORONARY ANGIOGRAPHY N/A 04/24/2017   Procedure: LEFT HEART CATH AND CORONARY ANGIOGRAPHY;  Surgeon: Jettie Booze, MD;  Location: [place] CV LAB;  Service: Cardiovascular;  Laterality: N/A;   LUMBAR LAMINECTOMY  06/02/2012   LUMBAR LAMINECTOMY/DECOMPRESSION MICRODISCECTOMY  06/02/2012   Procedure: LUMBAR LAMINECTOMY/DECOMPRESSION MICRODISCECTOMY 1 LEVEL;  Surgeon: Floyce Stakes, MD;  Location: [facility];  Service: Neurosurgery;  Laterality: Bilateral;  Bilateral Lumbar five-sacral one Foraminotomy and microdiskectomy    POLYPECTOMY  12/24/2020   Procedure: POLYPECTOMY;  Surgeon: Eloise Harman, DO;  [facility];  Service: Endoscopy;;   TOTAL KNEE ARTHROPLASTY Right 05/16/2022   Procedure: TOTAL KNEE ARTHROPLASTY;  Surgeon: Earlie Server, MD;  Location: [facility] ORS;  Service: Orthopedics;  Laterality: Right;    Family History  Problem Relation Age of Onset   Heart attack Mother    Diabetes Mother    Stroke Father    Heart  attack Father    Arthritis Other    Colon cancer Sister 6       Estimated age of onset; just found out about it in 22.   Neuropathy Neg Hx     Social History   Socioeconomic History   Marital status: Married    Spouse name: Not on file   Number of children: 1   Years of education: Not on file   Highest education level: Not on file  Occupational History   Occupation: retired  Tobacco Use   Smoking status: Former    Packs/day: 0.10    Years: 20.00    Total pack years: 2.00    Types: Cigarettes    Quit date: 05/08/1999    Years since quitting: 23.0   Smokeless tobacco: Never  Vaping Use   Vaping Use: Never used  Substance and Sexual Activity   Alcohol use: No   Drug use: No   Sexual activity: Yes  Other Topics Concern   Not on file  Social History Narrative   Patient is right handed.   Patient drinks caffeine occasionally   Social Determinants of Health   Financial Resource Strain: Low Risk  (10/29/2021)   Overall Financial Resource Strain (CARDIA)    Difficulty of Paying Living Expenses: Not hard at all  Food Insecurity: Unknown (05/16/2022)   Hunger Vital Sign    Worried About Running Out of Food in the Last Year: Patient refused    [place] in the Last Year: Patient refused  Transportation Needs: No Transportation Needs (05/16/2022)   PRAPARE - Hydrologist (Medical): No    Lack of Transportation (Non-Medical): No  Physical Activity: Inactive (10/29/2021)   Exercise Vital Sign    Days of Exercise per Week: 0 days    Minutes of Exercise per Session: 0 min  Stress: No Stress Concern Present (10/29/2021)   [place]    Feeling of Stress : Not at all  Social Connections: [place] (10/29/2021)   Social Connection and Isolation Panel [NHANES]    Frequency of Communication with Friends and Family: More than three times a week    Frequency of Social  Gatherings with Friends and Family: More than three times a week    Attends Religious Services: More than 4 times per year    Active Member of [employer] or Organizations: Yes    Attends Music therapist: More than 4 times per year    Marital Status: Married  Human resources officer Violence: Not At Risk (05/16/2022)   Humiliation, Afraid, Rape, and Kick questionnaire    Fear of Current or Ex-Partner: No    Emotionally Abused: No    Physically Abused: No    Sexually Abused: No    Outpatient Medications Prior to Visit  Medication Sig Dispense Refill   COLACE 100 MG capsule SMARTSIG:1-3 Capsule(s) By Mouth  PRN     oxyCODONE (OXY IR/ROXICODONE) 5 MG immediate release tablet Take 5 mg by mouth 4 (four) times daily as needed.     amLODipine (NORVASC) 10 MG tablet Take 1 tablet (10 mg total) by mouth daily. 90 tablet 3   aspirin EC 81 MG tablet Take 1 tablet (81 mg total) by mouth 2 (two) times daily. Swallow whole. 60 tablet 0   calcitRIOL (ROCALTROL) 0.25 MCG capsule Take 0.25 mcg by mouth every Monday, Wednesday, and Friday.     Coenzyme Q10-Vitamin E 100-150 MG-UNIT CAPS Take 1 capsule by mouth at bedtime.     dapagliflozin propanediol (FARXIGA) 5 MG TABS tablet Take 5 mg by mouth See admin instructions. Take 5 mg by mouth on Monday's and Thursday's.     ezetimibe (ZETIA) 10 MG tablet TAKE (1) TABLET BY MOUTH ONCE DAILY. (Patient taking differently: Take 10 mg by mouth daily.) 90 tablet 3   furosemide (LASIX) 20 MG tablet TAKE 1 TABLET BY MOUTH ONCE DAILY. 90 tablet 2   lisinopril (ZESTRIL) 10 MG tablet Take 1 tablet (10 mg total) by mouth daily.     metoprolol succinate (TOPROL-XL) 50 MG 24 hr tablet Take 50 mg by mouth daily.     Misc Natural Products (OSTEO BI-FLEX ADV JOINT SHIELD) TABS Take 1 tablet by mouth daily.     Multiple Vitamins-Minerals (CENTRUM PO) Take 1 tablet by mouth every morning.     nitroGLYCERIN (NITROSTAT) 0.4 MG SL tablet PLACE 1 TAB UNDER TONGUE EVERY 5 MIN IF  NEEDED FOR CHEST PAIN. MAY USE 3 TIMES.NO RELIEF CALL 911. (Patient taking differently: Place 0.4 mg under the tongue every 5 (five) minutes as needed for chest pain.) 25 tablet 3   pantoprazole (PROTONIX) 40 MG tablet Take 1 tablet (40 mg total) by mouth daily. 30 tablet 0   pregabalin (LYRICA) 200 MG capsule Take 1 capsule (200 mg total) by mouth 2 (two) times daily. 60 capsule 5   rosuvastatin (CRESTOR) 10 MG tablet TAKE (1) TABLET BY MOUTH ONCE DAILY. (Patient taking differently: Take 10 mg by mouth daily.) 90 tablet 1   sodium bicarbonate 650 MG tablet Take 650 mg by mouth 3 (three) times daily.     No facility-administered medications prior to visit.    Allergies  Allergen Reactions   Levaquin [Levofloxacin] Itching   Statins     Myalgias    ROS Review of Systems  Constitutional:  Negative for chills and fever.  HENT:  Negative for congestion and sore throat.   Eyes:  Negative for pain and discharge.  Respiratory:  Negative for cough and shortness of breath.   Cardiovascular:  Positive for leg swelling. Negative for chest pain and palpitations.  Gastrointestinal:  Negative for diarrhea, nausea and vomiting.  Endocrine: Negative for polydipsia and polyuria.  Genitourinary:  Negative for dysuria and hematuria.  Musculoskeletal:  Positive for arthralgias and joint swelling. Negative for neck pain and neck stiffness.  Skin:  Negative for rash.  Neurological:  Positive for weakness and numbness. Negative for dizziness and headaches.  Psychiatric/Behavioral:  Negative for agitation and behavioral problems.       Objective:    Physical Exam Vitals reviewed.  Constitutional:      General: He is not in acute distress.    Appearance: He is not diaphoretic.     Comments: Has a rolling walker  HENT:     Head: Normocephalic and atraumatic.     Nose: Nose normal.  Mouth/Throat:     Mouth: Mucous membranes are moist.  Eyes:     General: No scleral icterus.    Extraocular  Movements: Extraocular movements intact.  Cardiovascular:     Rate and Rhythm: Normal rate and regular rhythm.     Pulses: Normal pulses.     Heart sounds: Normal heart sounds. No murmur heard. Pulmonary:     Breath sounds: Normal breath sounds. No wheezing or rales.  Abdominal:     Palpations: Abdomen is soft.     Tenderness: There is no abdominal tenderness.  Musculoskeletal:     Cervical back: Neck supple. No tenderness.     Right lower leg: No edema.     Left lower leg: No edema.  Skin:    General: Skin is warm.     Findings: No rash.  Neurological:     General: No focal deficit present.     Mental Status: He is alert and oriented to person, place, and time.     Sensory: Sensory deficit (B/l feet) present.     Motor: Weakness (4/5 in b/l LE) present.  Psychiatric:        Mood and Affect: Mood normal.        Behavior: Behavior normal.     BP 102/62 (BP Location: Right Arm, Patient Position: Sitting, Cuff Size: Large)   Pulse 85   Ht 5' 9.5" (1.765 m)   Wt 199 lb 6.4 oz (90.4 kg)   SpO2 90%   BMI 29.02 kg/m  Wt Readings from Last 3 Encounters:  05/29/22 199 lb 6.4 oz (90.4 kg)  05/18/22 203 lb (92.1 kg)  05/16/22 204 lb 6.4 oz (92.7 kg)    Lab Results  Component Value Date   TSH 1.63 08/08/2019   Lab Results  Component Value Date   WBC 12.2 (H) 05/19/2022   HGB 12.6 (L) 05/19/2022   HCT 38.9 (L) 05/19/2022   MCV 91.5 05/19/2022   PLT 145 (L) 05/19/2022   Lab Results  Component Value Date   NA 137 05/19/2022   K 5.4 (H) 05/19/2022   CO2 21 (L) 05/19/2022   GLUCOSE 181 (H) 05/19/2022   BUN 40 (H) 05/19/2022   CREATININE 1.98 (H) 05/19/2022   BILITOT 0.6 05/18/2022   ALKPHOS 54 05/18/2022   AST 30 05/18/2022   ALT 12 05/18/2022   PROT 7.6 05/18/2022   ALBUMIN 3.5 05/18/2022   CALCIUM 8.8 (L) 05/19/2022   ANIONGAP 6 05/19/2022   Lab Results  Component Value Date   CHOL 238 (H) 07/04/2019   Lab Results  Component Value Date   HDL 53 07/04/2019    Lab Results  Component Value Date   LDLCALC 163 (H) 07/04/2019   Lab Results  Component Value Date   TRIG 124 07/04/2019   Lab Results  Component Value Date   CHOLHDL 4.5 07/04/2019   Lab Results  Component Value Date   HGBA1C 6.7 (H) 05/19/2022      Assessment & Plan:   Problem List Items Addressed This Visit       Respiratory   Acute respiratory failure with hypoxia ([place]) - Primary    Likely from postop atelectasis and/or acute bronchitis CT chest was negative for PE Had IV steroids Lung sounds clear today, no wheezing Does not need home O2        Endocrine   Type 2 diabetes mellitus with stage 3b chronic kidney disease, without long-term current use of insulin (HCC)    Lab Results  Component Value Date   HGBA1C 6.7 (H) 05/19/2022    New onset, has history of prediabetes, could be worse due to IV steroids On Farxiga twice in a week Advised to follow diabetic diet On statin and ACEi F/u CMP and lipid panel Diabetic foot exam: Today Diabetic eye exam: Advised to follow up with Ophthalmology for diabetic eye exam        Genitourinary   CKD (chronic kidney disease)    Had AKI on CKD Had hyperkalemia, treated with Lasix and insulin Planned to get repeat BMP in the next week and see nephrologist On sodium bicarb, ACEi and Lasix On Farxiga for proteinuria        Other   Hospital discharge follow-up    Hospital chart reviewed, including discharge summary Had acute hypoxic respiratory failure, resolved now Had AKI on CKD and hyperkalemia, could be due to dehydration, recheck BMP in the next week       No orders of the defined types were placed in this encounter.   Follow-up: Return if symptoms worsen or fail to improve.    Lindell Spar, MD

## 2022-05-29 NOTE — Patient Instructions (Addendum)
Please continue taking medications as prescribed.  Please get blood tests done as scheduled and ask Dr Theador Hawthorne about Lisinopril.  Please continue to follow low salt diet and ambulate as tolerated.

## 2022-05-29 NOTE — Assessment & Plan Note (Signed)
Likely from postop atelectasis and/or acute bronchitis CT chest was negative for PE Had IV steroids Lung sounds clear today, no wheezing Does not need home O2

## 2022-06-05 ENCOUNTER — Ambulatory Visit (HOSPITAL_COMMUNITY): Payer: Medicare Other | Attending: Orthopedic Surgery | Admitting: Physical Therapy

## 2022-06-05 DIAGNOSIS — M25661 Stiffness of right knee, not elsewhere classified: Secondary | ICD-10-CM | POA: Diagnosis present

## 2022-06-05 DIAGNOSIS — R2689 Other abnormalities of gait and mobility: Secondary | ICD-10-CM | POA: Diagnosis present

## 2022-06-05 DIAGNOSIS — M25561 Pain in right knee: Secondary | ICD-10-CM | POA: Insufficient documentation

## 2022-06-05 NOTE — Therapy (Signed)
OUTPATIENT PHYSICAL THERAPY LOWER EXTREMITY EVALUATION   Patient Name: Kyle Patel. MRN: [id number] DOB:11-27-1945, 77 y.o., male Today's Date: 06/05/2022  END OF SESSION:  PT End of Session - 06/05/22 1346     Visit Number 1    Number of Visits 12    Date for PT Re-Evaluation 07/17/22    Authorization Type [employer] Medicare    Progress Note Due on Visit 10    PT Start Time 0905    PT Stop Time 0945    PT Time Calculation (min) 40 min    Activity Tolerance Patient tolerated treatment well    Behavior During Therapy [facility] for tasks assessed/performed             Past Medical History:  Diagnosis Date   Acute anterior wall MI ([place]) 04/24/2017   Arthritis    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 s/p NSTEMI in 03/2017 with 100% distal LAD stenosis treated with POBA as stent could not be delivered   Chronic kidney disease    COPD (chronic obstructive pulmonary disease) ([place])    Essential hypertension    Hereditary and idiopathic peripheral neuropathy 05/2014   Hyperlipidemia    Pulmonary embolism ([place]) 10/2011   Past Surgical History:  Procedure Laterality Date   ABDOMINAL SURGERY     colon polyp removed 2007- sepsis, coma for 4 months per pt   BIOPSY  12/24/2020   Procedure: BIOPSY;  Surgeon: Eloise Harman, DO;  [facility];  Service: Endoscopy;;   CATARACT EXTRACTION W/PHACO Right 08/19/2021   Procedure: CATARACT EXTRACTION PHACO AND INTRAOCULAR LENS PLACEMENT ([place]);  Surgeon: Baruch Goldmann, MD;  Location: [facility];  Service: Ophthalmology;  Laterality: Right;  CDE: 7.02    CATARACT EXTRACTION W/PHACO Left 09/30/2021   Procedure: CATARACT EXTRACTION PHACO AND INTRAOCULAR LENS PLACEMENT (IOC);  Surgeon: Baruch Goldmann, MD;  Location: [facility];  Service: Ophthalmology;  Laterality: Left;  CDE 4.61   COLONOSCOPY  03/22/2010   RMR: 1. Normal rectum 2. Diminutive polyp at the mouth of the ileocecal valve  status post cold biopsy removal. Remaideer  of the colonic mucosa and terminal ileum mucosa appeared normal.    COLONOSCOPY N/A 06/25/2015   Procedure: COLONOSCOPY;  Surgeon: Daneil Dolin, MD;  [facility];  Service: Endoscopy;  Laterality: N/A;  200-rescheduled 1/30 per [place]    COLONOSCOPY WITH PROPOFOL N/A 12/24/2020   Procedure: COLONOSCOPY WITH PROPOFOL;  Surgeon: Eloise Harman, DO;  [facility];  Service: Endoscopy;  Laterality: N/A;  ASA III / 8:00   CORONARY BALLOON ANGIOPLASTY N/A 04/24/2017   Procedure: CORONARY BALLOON ANGIOPLASTY;  Surgeon: Jettie Booze, MD;  Location: [place] CV LAB;  Service: Cardiovascular;  Laterality: N/A;   LEFT HEART CATH AND CORONARY ANGIOGRAPHY N/A 04/24/2017   Procedure: LEFT HEART CATH AND CORONARY ANGIOGRAPHY;  Surgeon: Jettie Booze, MD;  Location: [place] CV LAB;  Service: Cardiovascular;  Laterality: N/A;   LUMBAR LAMINECTOMY  06/02/2012   LUMBAR LAMINECTOMY/DECOMPRESSION MICRODISCECTOMY  06/02/2012   Procedure: LUMBAR LAMINECTOMY/DECOMPRESSION MICRODISCECTOMY 1 LEVEL;  Surgeon: Floyce Stakes, MD;  Location: [place] [facility] OR[facility];  Service: Neurosurgery;  Laterality: Bilateral;  Bilateral Lumbar five-sacral one Foraminotomy and microdiskectomy    POLYPECTOMY  12/24/2020   Procedure: POLYPECTOMY;  Surgeon: Eloise Harman, DO;  [facility];  Service: Endoscopy;;   TOTAL KNEE ARTHROPLASTY Right 05/16/2022   Procedure: TOTAL KNEE ARTHROPLASTY;  Surgeon: Earlie Server, MD;  [facility];  Service: Orthopedics;  Laterality: Right;   Patient Active Problem List   Diagnosis Date Noted   Type 2 diabetes mellitus with stage 3b chronic kidney disease, without long-term current use of insulin ([place]) 05/29/2022   Hospital discharge follow-up 05/29/2022   Hyperkalemia 05/19/2022   Acute respiratory failure with hypoxia ([place]) 05/18/2022   S/P total knee arthroplasty, right 05/18/2022   COPD with acute exacerbation ([place]) 05/18/2022   Primary  localized osteoarthritis of right knee 05/16/2022   Encounter for general adult medical examination with abnormal findings 05/12/2022   Preop examination 02/21/2022   Leg swelling 02/21/2022   CAD (coronary artery disease) 09/02/2021   Prediabetes 06/15/2020   Hyperlipidemia 04/27/2017   NSTEMI (non-ST elevated myocardial infarction) ([place]) 04/24/2017   History of colonic polyps    Diverticulosis of colon without hemorrhage    Family history of colon cancer 06/11/2015   Chronic foot pain 03/08/2015   Idiopathic peripheral neuropathy 05/31/2014   Lumbar radiculopathy 11/21/2013   Pain in joint, shoulder region 07/21/2012   Nocturnal muscle cramps 01/19/2012   Hypertension 11/10/2011   CKD (chronic kidney disease) 11/10/2011   Osteoarthritis of knee 09/16/2011    PCP: Ihor Dow MD  REFERRING PROVIDER: Earlie Server, MD  REFERRING DIAG: right total knee replacement  THERAPY DIAG:  Right knee pain, unspecified chronicity  Stiffness of right knee, not elsewhere classified  Other abnormalities of gait and mobility  Rationale for Evaluation and Treatment: Rehabilitation  ONSET DATE: 05/16/22  SUBJECTIVE:   SUBJECTIVE STATEMENT: Patient presents to therapy with complaint of RT knee pain s/p RT TKA on 05/16/22. He reports ongoing pain, some days are better than others. He had home health therapy and is using CPM. He currently has CPM up to 105 degrees. He is currently walking with RW. Tapering pain meds down as able.   PERTINENT HISTORY: RT TKA   PAIN:  Are you having pain? Yes: NPRS scale: 6-7/10 at worst; at rest 0/10 Pain location: RT knee  Pain description: sore, stiff, aching Aggravating factors: bending, walking, standing Relieving factors: rest, med, ice   PRECAUTIONS: None  WEIGHT BEARING RESTRICTIONS: No  FALLS:  Has patient fallen in last 6 months? No  LIVING ENVIRONMENT: Lives with: lives with their family and lives with their spouse Lives in:  House/apartment Stairs: Yes: External: 2 steps; none Has following equipment at home: Single point cane and Environmental consultant - 2 wheeled  OCCUPATION: Retired, part time driver   PLOF: Independent  PATIENT GOALS: To get better   NEXT MD VISIT:   OBJECTIVE:   DIAGNOSTIC FINDINGS: NA  PATIENT SURVEYS:  FOTO 47% function   COGNITION: Overall cognitive status: Within functional limits for tasks assessed     SENSATION: WFL  EDEMA:  Minimal    LOWER EXTREMITY ROM:  Active ROM Right eval Left eval  Hip flexion    Hip extension    Hip abduction    Hip adduction    Hip internal rotation    Hip external rotation    Knee flexion 92 115  Knee extension -5 0  Ankle dorsiflexion    Ankle plantarflexion    Ankle inversion    Ankle eversion     (Blank rows = not tested)  LOWER EXTREMITY MMT:  MMT Right eval Left eval  Hip flexion 4+ 5  Hip extension    Hip abduction    Hip adduction    Hip internal rotation    Hip  external rotation    Knee flexion 4+ 5  Knee extension 4 5  Ankle dorsiflexion 5 5  Ankle plantarflexion    Ankle inversion    Ankle eversion     (Blank rows = not tested)   FUNCTIONAL TESTS:  2 minute walk test: 280 feet with RW   GAIT: Decreased stride, decreased hip and knee flexion of RT, using RW  TODAY'S TREATMENT:                                                                                                                              DATE:  06/05/22  Eval   PATIENT EDUCATION:  Education details: on eval findings, POC and HEP  Person educated: Patient Education method: Explanation Education comprehension: verbalized understanding  HOME EXERCISE PROGRAM: Access Code: [email or web address] URL: [email or web address] Date: 06/05/2022 Prepared by: Josue Hector  Exercises - Supine Quad Set  - 3 x daily - 7 x weekly - 2 sets - 10 reps - 5 second hold - Supine Active Straight Leg Raise  - 3 x daily - 7 x weekly - 2 sets - 10 reps -  Supine Heel Slide  - 3 x daily - 7 x weekly - 2 sets - 10 reps - 5 second hold - Supine Bridge  - 3 x daily - 7 x weekly - 2 sets - 10 reps  ASSESSMENT:  CLINICAL IMPRESSION: Patient is a 77 y.o. male who presents to physical therapy with complaint of RT knee pain s/p RT TKA. Patient demonstrates muscle weakness, reduced ROM, and fascial restrictions which are likely contributing to symptoms of pain and are negatively impacting patient ability to perform ADLs and functional mobility tasks. Patient will benefit from skilled physical therapy services to address these deficits to reduce pain and improve level of function with ADLs and functional mobility tasks.   OBJECTIVE IMPAIRMENTS: Abnormal gait, decreased activity tolerance, decreased mobility, difficulty walking, decreased ROM, decreased strength, hypomobility, increased edema, impaired flexibility, improper body mechanics, and pain.   ACTIVITY LIMITATIONS: carrying, lifting, bending, sitting, standing, squatting, sleeping, stairs, transfers, bed mobility, and locomotion level  PARTICIPATION LIMITATIONS: meal prep, cleaning, laundry, driving, shopping, community activity, occupation, and yard work  PERSONAL FACTORS:  NA  are also affecting patient's functional outcome.   REHAB POTENTIAL: Good  CLINICAL DECISION MAKING: Stable/uncomplicated  EVALUATION COMPLEXITY: Low   GOALS: SHORT TERM GOALS: Target date: 06/26/2022  Patient will be independent with initial HEP and self-management strategies to improve functional outcomes Baseline:  Goal status: INITIAL   LONG TERM GOALS: Target date: 07/17/2022  Patient will be independent with advanced HEP and self-management strategies to improve functional outcomes Baseline:  Goal status: INITIAL  2.  Patient will improve FOTO score to predicted value to indicate improvement in functional outcomes Baseline: 47% function  Goal status: INITIAL  3.  Patient will have RT knee AROM 0-120  degrees to improve functional mobility and facilitate squatting to pick up items from  floor. Baseline: -5 to 92 deg Goal status: INITIAL  4. Patient will have equal to or > 4+/5 MMT throughout BLE to improve ability to perform functional mobility, stair ambulation and ADLs.  Baseline: See MMT Goal status: INITIAL  5. Patient will be able to ambulate at least 350 feet during 2MWT with LRAD to demonstrate improved ability to perform functional mobility and associated tasks. Baseline: 280 feet with RW Goal status: INITIAL  PLAN:  PT FREQUENCY: 2x/week  PT DURATION: 6 weeks  PLANNED INTERVENTIONS: Therapeutic exercises, Therapeutic activity, Neuromuscular re-education, Balance training, Gait training, Patient/Family education, Joint manipulation, Joint mobilization, Stair training, Aquatic Therapy, Dry Needling, Electrical stimulation, Spinal manipulation, Spinal mobilization, Cryotherapy, Moist heat, scar mobilization, Taping, Traction, Ultrasound, Biofeedback, Ionotophoresis '4mg'$ /ml Dexamethasone, and Manual therapy.   PLAN FOR NEXT SESSION: Progress knee ROM, glute and quad strengthening as tolerated. Balance, gait and stairs when ready.   1:47 PM, 06/05/22 Josue Hector PT DPT  Physical Therapist with [facility]  [phone]

## 2022-06-10 ENCOUNTER — Encounter (HOSPITAL_COMMUNITY): Payer: Medicare Other | Admitting: Physical Therapy

## 2022-06-10 ENCOUNTER — Ambulatory Visit (HOSPITAL_COMMUNITY): Payer: Medicare Other | Admitting: Physical Therapy

## 2022-06-10 ENCOUNTER — Ambulatory Visit: Payer: Medicare Other | Admitting: Internal Medicine

## 2022-06-10 DIAGNOSIS — M25561 Pain in right knee: Secondary | ICD-10-CM | POA: Diagnosis not present

## 2022-06-10 DIAGNOSIS — R2689 Other abnormalities of gait and mobility: Secondary | ICD-10-CM

## 2022-06-10 DIAGNOSIS — M25661 Stiffness of right knee, not elsewhere classified: Secondary | ICD-10-CM

## 2022-06-10 NOTE — Therapy (Signed)
OUTPATIENT PHYSICAL THERAPY TREATMENT   Patient Name: Kyle Patel. 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 s/p NSTEMI in 03/2017 with 100% distal LAD stenosis treated with POBA as stent could not be delivered   Chronic kidney disease    COPD (chronic obstructive pulmonary disease) ([place])    Essential hypertension    Hereditary and idiopathic peripheral neuropathy 05/2014   Hyperlipidemia    Pulmonary embolism ([place]) 10/2011   Past Surgical History:  Procedure Laterality Date   ABDOMINAL SURGERY     colon polyp removed 2007- sepsis, coma for 4 months per pt   BIOPSY  12/24/2020   Procedure: BIOPSY;  Surgeon: Eloise Harman, DO;  [facility];  Service: Endoscopy;;   CATARACT EXTRACTION W/PHACO Right 08/19/2021   Procedure: CATARACT EXTRACTION PHACO AND INTRAOCULAR LENS PLACEMENT ([place]);  Surgeon: Baruch Goldmann, MD;  [facility];  Service: Ophthalmology;  Laterality: Right;  CDE: 7.02    CATARACT EXTRACTION W/PHACO Left 09/30/2021   Procedure: CATARACT EXTRACTION PHACO AND INTRAOCULAR LENS PLACEMENT (IOC);  Surgeon: Baruch Goldmann, MD;  [facility];  Service: Ophthalmology;  Laterality: Left;  CDE 4.61   COLONOSCOPY  03/22/2010   RMR: 1. Normal rectum 2. Diminutive polyp at the mouth of the ileocecal valve  status post cold biopsy removal. Remaideer of the colonic  mucosa and terminal ileum mucosa appeared normal.    COLONOSCOPY N/A 06/25/2015   Procedure: COLONOSCOPY;  Surgeon: Daneil Dolin, MD;  [facility];  Service: Endoscopy;  Laterality: N/A;  200-rescheduled 1/30 per [place]    COLONOSCOPY WITH PROPOFOL N/A 12/24/2020   Procedure: COLONOSCOPY WITH PROPOFOL;  Surgeon: Eloise Harman, DO;  [facility];  Service: Endoscopy;  Laterality: N/A;  ASA III / 8:00   CORONARY BALLOON ANGIOPLASTY N/A 04/24/2017   Procedure: CORONARY BALLOON ANGIOPLASTY;  Surgeon: Jettie Booze, MD;  Location: [place] [facility];  Service: Cardiovascular;  Laterality: N/A;   LEFT HEART CATH AND CORONARY ANGIOGRAPHY N/A 04/24/2017   Procedure: LEFT HEART CATH AND CORONARY ANGIOGRAPHY;  Surgeon: Jettie Booze, MD;  Location: [place] CV LAB;  Service: Cardiovascular;  Laterality: N/A;   LUMBAR LAMINECTOMY  06/02/2012   LUMBAR LAMINECTOMY/DECOMPRESSION MICRODISCECTOMY  06/02/2012   Procedure: LUMBAR LAMINECTOMY/DECOMPRESSION MICRODISCECTOMY 1 LEVEL;  Surgeon: Floyce Stakes, MD;  Location: [place] [facility] OR[facility];  Service: Neurosurgery;  Laterality: Bilateral;  Bilateral Lumbar five-sacral one Foraminotomy and microdiskectomy    POLYPECTOMY  12/24/2020   Procedure: POLYPECTOMY;  Surgeon: Eloise Harman, DO;  [facility];  Service: Endoscopy;;   TOTAL KNEE ARTHROPLASTY Right 05/16/2022   Procedure: TOTAL KNEE ARTHROPLASTY;  Surgeon:  Earlie Server, MD;  [facility];  Service: Orthopedics;  Laterality: Right;   Patient Active Problem List   Diagnosis Date Noted   Type 2 diabetes mellitus with stage 3b chronic kidney disease, without long-term current use of insulin ([place]) 05/29/2022   Hospital discharge follow-up 05/29/2022   Hyperkalemia 05/19/2022   Acute respiratory failure with hypoxia ([place]) 05/18/2022   S/P total knee arthroplasty, right 05/18/2022   COPD with acute exacerbation ([place]) 05/18/2022   Primary localized  osteoarthritis of right knee 05/16/2022   Encounter for general adult medical examination with abnormal findings 05/12/2022   Preop examination 02/21/2022   Leg swelling 02/21/2022   CAD (coronary artery disease) 09/02/2021   Prediabetes 06/15/2020   Hyperlipidemia 04/27/2017   NSTEMI (non-ST elevated myocardial infarction) ([place]) 04/24/2017   History of colonic polyps    Diverticulosis of colon without hemorrhage    Family history of colon cancer 06/11/2015   Chronic foot pain 03/08/2015   Idiopathic peripheral neuropathy 05/31/2014   Lumbar radiculopathy 11/21/2013   Pain in joint, shoulder region 07/21/2012   Nocturnal muscle cramps 01/19/2012   Hypertension 11/10/2011   CKD (chronic kidney disease) 11/10/2011   Osteoarthritis of knee 09/16/2011    PCP: Ihor Dow MD  REFERRING PROVIDER: Earlie Server, MD  REFERRING DIAG: right total knee replacement  THERAPY DIAG:  Right knee pain, unspecified chronicity  Stiffness of right knee, not elsewhere classified  Other abnormalities of gait and mobility  Rationale for Evaluation and Treatment: Rehabilitation  ONSET DATE: 05/16/22  SUBJECTIVE:   SUBJECTIVE STATEMENT: Patient comes to therapy today using RW for ambulation.  Reports currently no pain or issues.   PERTINENT HISTORY: RT TKA   PAIN:  Are you having pain? Yes: NPRS scale: 6-7/10 at worst; at rest 0/10 Pain location: RT knee  Pain description: sore, stiff, aching Aggravating factors: bending, walking, standing Relieving factors: rest, med, ice   PRECAUTIONS: None  WEIGHT BEARING RESTRICTIONS: No  FALLS:  Has patient fallen in last 6 months? No  LIVING ENVIRONMENT: Lives with: lives with their family and lives with their spouse Lives in: House/apartment Stairs: Yes: External: 2 steps; none Has following equipment at home: Single point cane and Environmental consultant - 2 wheeled  OCCUPATION: Retired, part time driver   PLOF: Independent  PATIENT GOALS: To  get better   NEXT MD VISIT:   OBJECTIVE:   DIAGNOSTIC FINDINGS: NA  PATIENT SURVEYS:  FOTO 47% function   COGNITION: Overall cognitive status: Within functional limits for tasks assessed     SENSATION: WFL  EDEMA:  Minimal    LOWER EXTREMITY ROM:  Active ROM Right eval Left eval Right 06/10/22  Hip flexion     Hip extension     Hip abduction     Hip adduction     Hip internal rotation     Hip external rotation     Knee flexion 92 115 105  Knee extension -5 0 -3  Ankle dorsiflexion     Ankle plantarflexion     Ankle inversion     Ankle eversion      (Blank rows = not tested)  LOWER EXTREMITY MMT:  MMT Right eval Left eval  Hip flexion 4+ 5  Hip extension    Hip abduction    Hip adduction    Hip internal rotation    Hip external rotation    Knee flexion 4+ 5  Knee extension 4 5  Ankle dorsiflexion 5 5  Ankle plantarflexion  Ankle inversion    Ankle eversion     (Blank rows = not tested)   FUNCTIONAL TESTS:  2 minute walk test: 280 feet with RW   GAIT: Decreased stride, decreased hip and knee flexion of RT, using RW  TODAY'S TREATMENT:                                                                                                                              DATE:  06/10/22 Bike seat 13 full revolutions 5 minutes Standing: knee flexion stretch 10X10" with 12" step  Heelraises 10X   Toeraises 10X  Rt knee flexion 10X  Hip abduction 10X each Ambulation using SPC in clinic, good cadence/sequencing min cues Supine:  heelslides 10X  SLR 10X Manual:  Soft tissue mobilization and scar massage f/b ROM measurement -3 to 105  06/05/22  Eval   PATIENT EDUCATION:  Education details: on eval findings, POC and HEP  Person educated: Patient Education method: Explanation Education comprehension: verbalized understanding  HOME EXERCISE PROGRAM: Access Code: [email or web address] URL: [email or web address] Date: 06/05/2022 Prepared by: Josue Hector  Exercises - Supine Quad Set  - 3 x daily - 7 x weekly - 2 sets - 10 reps - 5 second hold - Supine Active Straight Leg Raise  - 3 x daily - 7 x weekly - 2 sets - 10 reps - Supine Heel Slide  - 3 x daily - 7 x weekly - 2 sets - 10 reps - 5 second hold - Supine Bridge  - 3 x daily - 7 x weekly - 2 sets - 10 reps  ASSESSMENT:  CLINICAL IMPRESSION: Reviewed goals and POC moving forward.  Began on bike with ability to make full revolutions following a few repetitions of rocking.  Progressed to standing exercises for strengthening and stretching with ability to complete without issues.  Gait training with SPC with good sequencing and cadence requiring minimal cues.  No LOB using SPC.  Encouraged to use this solely at home as we will transition away from walker.  Soft tissue massage completed to reduce scar tissue and tightness perimeter of knee.  ROM increased today to -3 to 105 following.   Patient will benefit from skilled physical therapy services to address these deficits to reduce pain and improve level of function with ADLs and functional mobility tasks.   OBJECTIVE IMPAIRMENTS: Abnormal gait, decreased activity tolerance, decreased mobility, difficulty walking, decreased ROM, decreased strength, hypomobility, increased edema, impaired flexibility, improper body mechanics, and pain.   ACTIVITY LIMITATIONS: carrying, lifting, bending, sitting, standing, squatting, sleeping, stairs, transfers, bed mobility, and locomotion level  PARTICIPATION LIMITATIONS: meal prep, cleaning, laundry, driving, shopping, community activity, occupation, and yard work  PERSONAL FACTORS:  NA  are also affecting patient's functional outcome.   REHAB POTENTIAL: Good  CLINICAL DECISION MAKING: Stable/uncomplicated  EVALUATION COMPLEXITY: Low   GOALS: SHORT TERM GOALS: Target date: 06/26/2022  Patient will be independent with initial HEP and self-management strategies to  improve functional  outcomes Baseline:  Goal status: IN PROGRESS   LONG TERM GOALS: Target date: 07/17/2022  Patient will be independent with advanced HEP and self-management strategies to improve functional outcomes Baseline:  Goal status: IN PROGRESS  2.  Patient will improve FOTO score to predicted value to indicate improvement in functional outcomes Baseline: 47% function  Goal status: IN PROGRESS  3.  Patient will have RT knee AROM 0-120 degrees to improve functional mobility and facilitate squatting to pick up items from floor. Baseline: -5 to 92 deg Goal status: IN PROGRESS  4. Patient will have equal to or > 4+/5 MMT throughout BLE to improve ability to perform functional mobility, stair ambulation and ADLs.  Baseline: See MMT Goal status: IN PROGRESS  5. Patient will be able to ambulate at least 350 feet during 2MWT with LRAD to demonstrate improved ability to perform functional mobility and associated tasks. Baseline: 280 feet with RW Goal status: IN PROGRESS  PLAN:  PT FREQUENCY: 2x/week  PT DURATION: 6 weeks  PLANNED INTERVENTIONS: Therapeutic exercises, Therapeutic activity, Neuromuscular re-education, Balance training, Gait training, Patient/Family education, Joint manipulation, Joint mobilization, Stair training, Aquatic Therapy, Dry Needling, Electrical stimulation, Spinal manipulation, Spinal mobilization, Cryotherapy, Moist heat, scar mobilization, Taping, Traction, Ultrasound, Biofeedback, Ionotophoresis '4mg'$ /ml Dexamethasone, and Manual therapy.   PLAN FOR NEXT SESSION: Progress knee ROM, glute and quad strengthening as tolerated. Stairs when ready.   9:56 AM, 06/10/22 Teena Irani, PTA/CLT [place] Ph: [phone]

## 2022-06-12 ENCOUNTER — Ambulatory Visit (HOSPITAL_COMMUNITY): Payer: Medicare Other | Admitting: Physical Therapy

## 2022-06-12 DIAGNOSIS — M25661 Stiffness of right knee, not elsewhere classified: Secondary | ICD-10-CM

## 2022-06-12 DIAGNOSIS — R2689 Other abnormalities of gait and mobility: Secondary | ICD-10-CM

## 2022-06-12 DIAGNOSIS — M25561 Pain in right knee: Secondary | ICD-10-CM | POA: Diagnosis not present

## 2022-06-12 NOTE — Therapy (Signed)
OUTPATIENT PHYSICAL THERAPY TREATMENT   Patient Name: Kyle Patel. 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 s/p NSTEMI in 03/2017 with 100% distal LAD stenosis treated with POBA as stent could not be delivered   Chronic kidney disease    COPD (chronic obstructive pulmonary disease) ([place])    Essential hypertension    Hereditary and idiopathic peripheral neuropathy 05/2014   Hyperlipidemia    Pulmonary embolism ([place]) 10/2011   Past Surgical History:  Procedure Laterality Date   ABDOMINAL SURGERY     colon polyp removed 2007- sepsis, coma for 4 months per pt   BIOPSY  12/24/2020   Procedure: BIOPSY;  Surgeon: Eloise Harman, DO;  [facility];  Service: Endoscopy;;   CATARACT EXTRACTION W/PHACO Right 08/19/2021   Procedure: CATARACT EXTRACTION PHACO AND INTRAOCULAR LENS PLACEMENT ([place]);  Surgeon: Baruch Goldmann, MD;  Location: [facility];  Service: Ophthalmology;  Laterality: Right;  CDE: 7.02    CATARACT EXTRACTION W/PHACO Left 09/30/2021   Procedure: CATARACT EXTRACTION PHACO AND INTRAOCULAR LENS PLACEMENT ([facility]);  Surgeon: Baruch Goldmann, MD;  Location: [facility];  Service: Ophthalmology;  Laterality: Left;  CDE 4.61   COLONOSCOPY  03/22/2010   RMR: 1. Normal rectum 2. Diminutive polyp at the mouth of the ileocecal valve  status post cold biopsy removal. Remaideer of the colonic  mucosa and terminal ileum mucosa appeared normal.    COLONOSCOPY N/A 06/25/2015   Procedure: COLONOSCOPY;  Surgeon: Daneil Dolin, MD;  [facility];  Service: Endoscopy;  Laterality: N/A;  200-rescheduled 1/30 per [place]    COLONOSCOPY WITH PROPOFOL N/A 12/24/2020   Procedure: COLONOSCOPY WITH PROPOFOL;  Surgeon: Eloise Harman, DO;  [facility];  Service: Endoscopy;  Laterality: N/A;  ASA III / 8:00   CORONARY BALLOON ANGIOPLASTY N/A 04/24/2017   Procedure: CORONARY BALLOON ANGIOPLASTY;  Surgeon: Jettie Booze, MD;  Location: [facility];  Service: Cardiovascular;  Laterality: N/A;   LEFT HEART CATH AND CORONARY ANGIOGRAPHY N/A 04/24/2017   Procedure: LEFT HEART CATH AND CORONARY ANGIOGRAPHY;  Surgeon: Jettie Booze, MD;  Location: [facility];  Service: Cardiovascular;  Laterality: N/A;   LUMBAR LAMINECTOMY  06/02/2012   LUMBAR LAMINECTOMY/DECOMPRESSION MICRODISCECTOMY  06/02/2012   Procedure: LUMBAR LAMINECTOMY/DECOMPRESSION MICRODISCECTOMY 1 LEVEL;  Surgeon: Floyce Stakes, MD;  Location: [facility];  Service: Neurosurgery;  Laterality: Bilateral;  Bilateral Lumbar five-sacral one Foraminotomy and microdiskectomy    POLYPECTOMY  12/24/2020   Procedure: POLYPECTOMY;  Surgeon: Eloise Harman, DO;  [facility];  Service: Endoscopy;;   TOTAL KNEE ARTHROPLASTY Right 05/16/2022   Procedure: TOTAL KNEE ARTHROPLASTY;  Surgeon:  Earlie Server, MD;  Location: [facility];  Service: Orthopedics;  Laterality: Right;   Patient Active Problem List   Diagnosis Date Noted   Type 2 diabetes mellitus with stage 3b chronic kidney disease, without long-term current use of insulin ([place]) 05/29/2022   Hospital discharge follow-up 05/29/2022   Hyperkalemia 05/19/2022   Acute respiratory failure with hypoxia ([place]) 05/18/2022   S/P total knee arthroplasty, right 05/18/2022   COPD with acute exacerbation ([place]) 05/18/2022   Primary localized  osteoarthritis of right knee 05/16/2022   Encounter for general adult medical examination with abnormal findings 05/12/2022   Preop examination 02/21/2022   Leg swelling 02/21/2022   CAD (coronary artery disease) 09/02/2021   Prediabetes 06/15/2020   Hyperlipidemia 04/27/2017   NSTEMI (non-ST elevated myocardial infarction) ([place]) 04/24/2017   History of colonic polyps    Diverticulosis of colon without hemorrhage    Family history of colon cancer 06/11/2015   Chronic foot pain 03/08/2015   Idiopathic peripheral neuropathy 05/31/2014   Lumbar radiculopathy 11/21/2013   Pain in joint, shoulder region 07/21/2012   Nocturnal muscle cramps 01/19/2012   Hypertension 11/10/2011   CKD (chronic kidney disease) 11/10/2011   Osteoarthritis of knee 09/16/2011    PCP: Ihor Dow MD  REFERRING PROVIDER: Earlie Server, MD  REFERRING DIAG: right total knee replacement  THERAPY DIAG:  Right knee pain, unspecified chronicity  Stiffness of right knee, not elsewhere classified  Other abnormalities of gait and mobility  Rationale for Evaluation and Treatment: Rehabilitation  ONSET DATE: 05/16/22  SUBJECTIVE:   SUBJECTIVE STATEMENT: Patient comes to therapy today using SPC for ambulation. States they picked up his CPM today and he had went all the way to 120 on it.   Reports currently no pain or issues other than knee soreness, some stiffness.   PERTINENT HISTORY: RT TKA  05/16/22  PAIN:  Are you having pain? No  PRECAUTIONS: None  WEIGHT BEARING RESTRICTIONS: No  FALLS:  Has patient fallen in last 6 months? No  LIVING ENVIRONMENT: Lives with: lives with their family and lives with their spouse Lives in: House/apartment Stairs: Yes: External: 2 steps; none Has following equipment at home: Single point cane and Environmental consultant - 2 wheeled  OCCUPATION: Retired, part time driver   PLOF: Independent  PATIENT GOALS: To get better   NEXT MD VISIT:   OBJECTIVE:   DIAGNOSTIC  FINDINGS: NA  PATIENT SURVEYS:  FOTO 47% function   COGNITION: Overall cognitive status: Within functional limits for tasks assessed     SENSATION: WFL  EDEMA:  Minimal    LOWER EXTREMITY ROM:  Active ROM Right eval Left eval Right 06/10/22  Hip flexion     Hip extension     Hip abduction     Hip adduction     Hip internal rotation     Hip external rotation     Knee flexion 92 115 105  Knee extension -5 0 -3  Ankle dorsiflexion     Ankle plantarflexion     Ankle inversion     Ankle eversion      (Blank rows = not tested)  LOWER EXTREMITY MMT:  MMT Right eval Left eval  Hip flexion 4+ 5  Hip extension    Hip abduction    Hip adduction    Hip internal rotation    Hip external rotation    Knee flexion 4+ 5  Knee extension 4 5  Ankle dorsiflexion 5 5  Ankle plantarflexion    Ankle inversion  Ankle eversion     (Blank rows = not tested)   FUNCTIONAL TESTS:  2 minute walk test: 280 feet with RW   GAIT: Decreased stride, decreased hip and knee flexion of RT, using RW  TODAY'S TREATMENT:                                                                                                                              DATE:  06/12/22 Bike seat 12 full revolutions 5 minutes Standing: knee flexion stretch 10X10" with 12" step  Heelraises 15X   Toeraises 15X  Rt knee flexion 15X  Hip abduction 15X each  4" forward step ups with bi UE assist 10X Sit to stands no UE from standard chair 5X  LAQ 10X5" Supine:  heelslides 10X  SLR 10X  SAQ 10X5" Manual:  Soft tissue mobilization and scar massage f/b ROM measurement -3 to 107   06/10/22 Bike seat 13 full revolutions 5 minutes Standing: knee flexion stretch 10X10" with 12" step  Heelraises 10X   Toeraises 10X  Rt knee flexion 10X  Hip abduction 10X each Ambulation using SPC in clinic, good cadence/sequencing min cues Supine:  heelslides 10X  SLR 10X Manual:  Soft tissue mobilization and scar massage f/b  ROM measurement -3 to 105  06/05/22  Eval   PATIENT EDUCATION:  Education details: on eval findings, POC and HEP  Person educated: Patient Education method: Explanation Education comprehension: verbalized understanding  HOME EXERCISE PROGRAM: Access Code: [email or web address] URL: [email or web address] Date: 06/05/2022 Prepared by: Josue Hector  Exercises - Supine Quad Set  - 3 x daily - 7 x weekly - 2 sets - 10 reps - 5 second hold - Supine Active Straight Leg Raise  - 3 x daily - 7 x weekly - 2 sets - 10 reps - Supine Heel Slide  - 3 x daily - 7 x weekly - 2 sets - 10 reps - 5 second hold - Supine Bridge  - 3 x daily - 7 x weekly - 2 sets - 10 reps  ASSESSMENT:  CLINICAL IMPRESSION: Continued focus on improving Rt knee ROM and strength.  Began forward step ups and added short and long arc quads to improve extension.  ROM continues to improve for flexion with additional 2 degrees gained actively.  Scar tissue remains at proximal end of scar but reduced with manual techniques.  Encouraged to continue established POC and focus mainly on return of ROM. Pt with questions regarding driving, use of Cane and going up and down steps.  Informed we would progress to those things and he could discuss the driving with this MD when he returns.  ROM increased today to -3 to 107 following.   Patient will benefit from skilled physical therapy services to address these deficits to reduce pain and improve level of function with ADLs and functional mobility tasks.   OBJECTIVE IMPAIRMENTS: Abnormal gait, decreased activity tolerance, decreased mobility, difficulty walking, decreased ROM, decreased strength,  hypomobility, increased edema, impaired flexibility, improper body mechanics, and pain.   ACTIVITY LIMITATIONS: carrying, lifting, bending, sitting, standing, squatting, sleeping, stairs, transfers, bed mobility, and locomotion level  PARTICIPATION LIMITATIONS: meal prep, cleaning, laundry,  driving, shopping, community activity, occupation, and yard work  PERSONAL FACTORS:  NA  are also affecting patient's functional outcome.   REHAB POTENTIAL: Good  CLINICAL DECISION MAKING: Stable/uncomplicated  EVALUATION COMPLEXITY: Low   GOALS: SHORT TERM GOALS: Target date: 06/26/2022  Patient will be independent with initial HEP and self-management strategies to improve functional outcomes Baseline:  Goal status: IN PROGRESS   LONG TERM GOALS: Target date: 07/17/2022  Patient will be independent with advanced HEP and self-management strategies to improve functional outcomes Baseline:  Goal status: IN PROGRESS  2.  Patient will improve FOTO score to predicted value to indicate improvement in functional outcomes Baseline: 47% function  Goal status: IN PROGRESS  3.  Patient will have RT knee AROM 0-120 degrees to improve functional mobility and facilitate squatting to pick up items from floor. Baseline: -5 to 92 deg Goal status: IN PROGRESS  4. Patient will have equal to or > 4+/5 MMT throughout BLE to improve ability to perform functional mobility, stair ambulation and ADLs.  Baseline: See MMT Goal status: IN PROGRESS  5. Patient will be able to ambulate at least 350 feet during 2MWT with LRAD to demonstrate improved ability to perform functional mobility and associated tasks. Baseline: 280 feet with RW Goal status: IN PROGRESS  PLAN:  PT FREQUENCY: 2x/week  PT DURATION: 6 weeks  PLANNED INTERVENTIONS: Therapeutic exercises, Therapeutic activity, Neuromuscular re-education, Balance training, Gait training, Patient/Family education, Joint manipulation, Joint mobilization, Stair training, Aquatic Therapy, Dry Needling, Electrical stimulation, Spinal manipulation, Spinal mobilization, Cryotherapy, Moist heat, scar mobilization, Taping, Traction, Ultrasound, Biofeedback, Ionotophoresis '4mg'$ /ml Dexamethasone, and Manual therapy.   PLAN FOR NEXT SESSION: Progress knee ROM,  glute and quad strengthening as tolerated. Stairs when ready.   4:00 PM, 06/12/22 Teena Irani, PTA/CLT [facility] Ph: [phone]

## 2022-06-13 ENCOUNTER — Other Ambulatory Visit: Payer: Self-pay | Admitting: Internal Medicine

## 2022-06-17 ENCOUNTER — Ambulatory Visit (HOSPITAL_COMMUNITY): Payer: Medicare Other | Admitting: Physical Therapy

## 2022-06-17 DIAGNOSIS — M25661 Stiffness of right knee, not elsewhere classified: Secondary | ICD-10-CM

## 2022-06-17 DIAGNOSIS — M25561 Pain in right knee: Secondary | ICD-10-CM

## 2022-06-17 NOTE — Therapy (Signed)
OUTPATIENT PHYSICAL THERAPY TREATMENT   Patient Name: Kyle Patel. 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 s/p NSTEMI in 03/2017 with 100% distal LAD stenosis treated with POBA as stent could not be delivered   Chronic kidney disease    COPD (chronic obstructive pulmonary disease) ([place])    Essential hypertension    Hereditary and idiopathic peripheral neuropathy 05/2014   Hyperlipidemia    Pulmonary embolism ([place]) 10/2011   Past Surgical History:  Procedure Laterality Date   ABDOMINAL SURGERY     colon polyp removed 2007- sepsis, coma for 4 months per pt   BIOPSY  12/24/2020   Procedure: BIOPSY;  Surgeon: Eloise Harman, DO;  [facility];  Service: Endoscopy;;   CATARACT EXTRACTION W/PHACO Right 08/19/2021   Procedure: CATARACT EXTRACTION PHACO AND INTRAOCULAR LENS PLACEMENT ([place]);  Surgeon: Baruch Goldmann, MD;  Location: [facility];  Service: Ophthalmology;  Laterality: Right;  CDE: 7.02    CATARACT EXTRACTION W/PHACO Left 09/30/2021   Procedure: CATARACT EXTRACTION PHACO AND INTRAOCULAR LENS PLACEMENT ([facility]);  Surgeon: Baruch Goldmann, MD;  Location: [facility];  Service: Ophthalmology;  Laterality: Left;  CDE 4.61   COLONOSCOPY  03/22/2010   RMR: 1. Normal rectum 2. Diminutive polyp at the mouth of the ileocecal valve  status post cold biopsy removal. Remaideer of the colonic  mucosa and terminal ileum mucosa appeared normal.    COLONOSCOPY N/A 06/25/2015   Procedure: COLONOSCOPY;  Surgeon: Daneil Dolin, MD;  [facility];  Service: Endoscopy;  Laterality: N/A;  200-rescheduled 1/30 per [place]    COLONOSCOPY WITH PROPOFOL N/A 12/24/2020   Procedure: COLONOSCOPY WITH PROPOFOL;  Surgeon: Eloise Harman, DO;  [facility];  Service: Endoscopy;  Laterality: N/A;  ASA III / 8:00   CORONARY BALLOON ANGIOPLASTY N/A 04/24/2017   Procedure: CORONARY BALLOON ANGIOPLASTY;  Surgeon: Jettie Booze, MD;  Location: [facility];  Service: Cardiovascular;  Laterality: N/A;   LEFT HEART CATH AND CORONARY ANGIOGRAPHY N/A 04/24/2017   Procedure: LEFT HEART CATH AND CORONARY ANGIOGRAPHY;  Surgeon: Jettie Booze, MD;  Location: [facility];  Service: Cardiovascular;  Laterality: N/A;   LUMBAR LAMINECTOMY  06/02/2012   LUMBAR LAMINECTOMY/DECOMPRESSION MICRODISCECTOMY  06/02/2012   Procedure: LUMBAR LAMINECTOMY/DECOMPRESSION MICRODISCECTOMY 1 LEVEL;  Surgeon: Floyce Stakes, MD;  Location: [facility];  Service: Neurosurgery;  Laterality: Bilateral;  Bilateral Lumbar five-sacral one Foraminotomy and microdiskectomy    POLYPECTOMY  12/24/2020   Procedure: POLYPECTOMY;  Surgeon: Eloise Harman, DO;  [facility];  Service: Endoscopy;;   TOTAL KNEE ARTHROPLASTY Right 05/16/2022   Procedure: TOTAL KNEE ARTHROPLASTY;  Surgeon:  Earlie Server, MD;  [facility];  Service: Orthopedics;  Laterality: Right;   Patient Active Problem List   Diagnosis Date Noted   Type 2 diabetes mellitus with stage 3b chronic kidney disease, without long-term current use of insulin ([place]) 05/29/2022   Hospital discharge follow-up 05/29/2022   Hyperkalemia 05/19/2022   Acute respiratory failure with hypoxia ([place]) 05/18/2022   S/P total knee arthroplasty, right 05/18/2022   COPD with acute exacerbation ([employer]) 05/18/2022   Primary localized  osteoarthritis of right knee 05/16/2022   Encounter for general adult medical examination with abnormal findings 05/12/2022   Preop examination 02/21/2022   Leg swelling 02/21/2022   CAD (coronary artery disease) 09/02/2021   Prediabetes 06/15/2020   Hyperlipidemia 04/27/2017   NSTEMI (non-ST elevated myocardial infarction) ([place]) 04/24/2017   History of colonic polyps    Diverticulosis of colon without hemorrhage    Family history of colon cancer 06/11/2015   Chronic foot pain 03/08/2015   Idiopathic peripheral neuropathy 05/31/2014   Lumbar radiculopathy 11/21/2013   Pain in joint, shoulder region 07/21/2012   Nocturnal muscle cramps 01/19/2012   Hypertension 11/10/2011   CKD (chronic kidney disease) 11/10/2011   Osteoarthritis of knee 09/16/2011    PCP: Ihor Dow MD  REFERRING PROVIDER: Earlie Server, MD  REFERRING DIAG: right total knee replacement  THERAPY DIAG:  Right knee pain, unspecified chronicity  Stiffness of right knee, not elsewhere classified  Rationale for Evaluation and Treatment: Rehabilitation  ONSET DATE: 05/16/22  SUBJECTIVE:   SUBJECTIVE STATEMENT: Has good days and bad days. Knee is stiff today sore from ankle up to knee. "No pain, just sore"  PERTINENT HISTORY: RT TKA  05/16/22  PAIN:  Are you having pain? No  PRECAUTIONS: None  WEIGHT BEARING RESTRICTIONS: No  FALLS:  Has patient fallen in last 6 months? No  LIVING ENVIRONMENT: Lives with: lives with their family and lives with their spouse Lives in: House/apartment Stairs: Yes: External: 2 steps; none Has following equipment at home: Single point cane and Environmental consultant - 2 wheeled  OCCUPATION: Retired, part time driver   PLOF: Independent  PATIENT GOALS: To get better   NEXT MD VISIT:   OBJECTIVE:   DIAGNOSTIC FINDINGS: NA  PATIENT SURVEYS:  FOTO 47% function   COGNITION: Overall cognitive status: Within functional limits for tasks  assessed     SENSATION: WFL  EDEMA:  Minimal    LOWER EXTREMITY ROM:  Active ROM Right eval Left eval Right 06/10/22  Hip flexion     Hip extension     Hip abduction     Hip adduction     Hip internal rotation     Hip external rotation     Knee flexion 92 115 105  Knee extension -5 0 -3  Ankle dorsiflexion     Ankle plantarflexion     Ankle inversion     Ankle eversion      (Blank rows = not tested)  LOWER EXTREMITY MMT:  MMT Right eval Left eval  Hip flexion 4+ 5  Hip extension    Hip abduction    Hip adduction    Hip internal rotation    Hip external rotation    Knee flexion 4+ 5  Knee extension 4 5  Ankle dorsiflexion 5 5  Ankle plantarflexion    Ankle inversion    Ankle eversion     (Blank rows = not tested)   FUNCTIONAL TESTS:  2 minute walk test: 280 feet with RW   GAIT:  Decreased stride, decreased hip and knee flexion of RT, using RW  TODAY'S TREATMENT:                                                                                                                              DATE:  06/17/22 Rec bike lv 2 5 min (seat 14)  Slant board 3 x 30" HR x 20 TR x 20  Sit to stand x 10 4 inch step up 2 x 10  Standing hip abduction 2 x 10 Tandem stance 3 x 20"  Clinic amb 150 feet with no AD    Manual PROM RT knee flexion/ extension   RT knee AROM (-3 to 107 deg)  06/12/22 Bike seat 12 full revolutions 5 minutes Standing: knee flexion stretch 10X10" with 12" step  Heelraises 15X   Toeraises 15X  Rt knee flexion 15X  Hip abduction 15X each  4" forward step ups with bi UE assist 10X Sit to stands no UE from standard chair 5X  LAQ 10X5" Supine:  heelslides 10X  SLR 10X  SAQ 10X5" Manual:  Soft tissue mobilization and scar massage f/b ROM measurement -3 to 107   06/10/22 Bike seat 13 full revolutions 5 minutes Standing: knee flexion stretch 10X10" with 12" step  Heelraises 10X   Toeraises 10X  Rt knee flexion 10X  Hip abduction 10X  each Ambulation using SPC in clinic, good cadence/sequencing min cues Supine:  heelslides 10X  SLR 10X Manual:  Soft tissue mobilization and scar massage f/b ROM measurement -3 to 105  06/05/22  Eval   PATIENT EDUCATION:  Education details: on eval findings, POC and HEP  Person educated: Patient Education method: Explanation Education comprehension: verbalized understanding  HOME EXERCISE PROGRAM: Access Code: [email or web address] URL: [email or web address] Date: 06/05/2022 Prepared by: Josue Hector  Exercises - Supine Quad Set  - 3 x daily - 7 x weekly - 2 sets - 10 reps - 5 second hold - Supine Active Straight Leg Raise  - 3 x daily - 7 x weekly - 2 sets - 10 reps - Supine Heel Slide  - 3 x daily - 7 x weekly - 2 sets - 10 reps - 5 second hold - Supine Bridge  - 3 x daily - 7 x weekly - 2 sets - 10 reps  ASSESSMENT:  CLINICAL IMPRESSION: Patient tolerated session well today. Continued with established POC for improved LE strength and knee ROM. Patient continues to be limited in RT knee flexion. Performed manual PROM to improve knee mobility. Patient challenged with static balance activity. Performed gait with no AD. Patient with ongoing gait deviations, but demos good stability without using AD. Patient will continue to benefit from skilled therapy services to reduce remaining deficits and improve functional ability.     OBJECTIVE IMPAIRMENTS: Abnormal gait, decreased activity tolerance, decreased mobility, difficulty walking, decreased ROM, decreased strength, hypomobility, increased edema, impaired flexibility, improper body mechanics, and pain.   ACTIVITY LIMITATIONS: carrying,  lifting, bending, sitting, standing, squatting, sleeping, stairs, transfers, bed mobility, and locomotion level  PARTICIPATION LIMITATIONS: meal prep, cleaning, laundry, driving, shopping, community activity, occupation, and yard work  PERSONAL FACTORS:  NA  are also affecting patient's  functional outcome.   REHAB POTENTIAL: Good  CLINICAL DECISION MAKING: Stable/uncomplicated  EVALUATION COMPLEXITY: Low   GOALS: SHORT TERM GOALS: Target date: 06/26/2022  Patient will be independent with initial HEP and self-management strategies to improve functional outcomes Baseline:  Goal status: IN PROGRESS   LONG TERM GOALS: Target date: 07/17/2022  Patient will be independent with advanced HEP and self-management strategies to improve functional outcomes Baseline:  Goal status: IN PROGRESS  2.  Patient will improve FOTO score to predicted value to indicate improvement in functional outcomes Baseline: 47% function  Goal status: IN PROGRESS  3.  Patient will have RT knee AROM 0-120 degrees to improve functional mobility and facilitate squatting to pick up items from floor. Baseline: -5 to 92 deg Goal status: IN PROGRESS  4. Patient will have equal to or > 4+/5 MMT throughout BLE to improve ability to perform functional mobility, stair ambulation and ADLs.  Baseline: See MMT Goal status: IN PROGRESS  5. Patient will be able to ambulate at least 350 feet during 2MWT with LRAD to demonstrate improved ability to perform functional mobility and associated tasks. Baseline: 280 feet with RW Goal status: IN PROGRESS  PLAN:  PT FREQUENCY: 2x/week  PT DURATION: 6 weeks  PLANNED INTERVENTIONS: Therapeutic exercises, Therapeutic activity, Neuromuscular re-education, Balance training, Gait training, Patient/Family education, Joint manipulation, Joint mobilization, Stair training, Aquatic Therapy, Dry Needling, Electrical stimulation, Spinal manipulation, Spinal mobilization, Cryotherapy, Moist heat, scar mobilization, Taping, Traction, Ultrasound, Biofeedback, Ionotophoresis '4mg'$ /ml Dexamethasone, and Manual therapy.   PLAN FOR NEXT SESSION: Progress knee ROM, glute and quad strengthening as tolerated. Stairs when ready.  3:55 PM, 06/17/22 Josue Hector PT DPT  Physical  Therapist with [facility]  [phone]

## 2022-06-19 ENCOUNTER — Ambulatory Visit (HOSPITAL_COMMUNITY): Payer: Medicare Other | Admitting: Physical Therapy

## 2022-06-19 DIAGNOSIS — M25661 Stiffness of right knee, not elsewhere classified: Secondary | ICD-10-CM

## 2022-06-19 DIAGNOSIS — R2689 Other abnormalities of gait and mobility: Secondary | ICD-10-CM

## 2022-06-19 DIAGNOSIS — M25561 Pain in right knee: Secondary | ICD-10-CM

## 2022-06-19 NOTE — Therapy (Signed)
OUTPATIENT PHYSICAL THERAPY TREATMENT   Patient Name: Kyle Patel. 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 s/p NSTEMI in 03/2017 with 100% distal LAD stenosis treated with POBA as stent could not be delivered   Chronic kidney disease    COPD (chronic obstructive pulmonary disease) ([place])    Essential hypertension    Hereditary and idiopathic peripheral neuropathy 05/2014   Hyperlipidemia    Pulmonary embolism ([place]) 10/2011   Past Surgical History:  Procedure Laterality Date   ABDOMINAL SURGERY     colon polyp removed 2007- sepsis, coma for 4 months per pt   BIOPSY  12/24/2020   Procedure: BIOPSY;  Surgeon: Eloise Harman, DO;  [facility];  Service: Endoscopy;;   CATARACT EXTRACTION W/PHACO Right 08/19/2021   Procedure: CATARACT EXTRACTION PHACO AND INTRAOCULAR LENS PLACEMENT ([place]);  Surgeon: Baruch Goldmann, MD;  Location: [facility];  Service: Ophthalmology;  Laterality: Right;  CDE: 7.02    CATARACT EXTRACTION W/PHACO Left 09/30/2021   Procedure: CATARACT EXTRACTION PHACO AND INTRAOCULAR LENS PLACEMENT ([facility]);  Surgeon: Baruch Goldmann, MD;  Location: [facility];  Service: Ophthalmology;  Laterality: Left;  CDE 4.61   COLONOSCOPY  03/22/2010   RMR: 1. Normal rectum 2. Diminutive polyp at the mouth of the ileocecal valve  status post cold biopsy removal. Remaideer of the colonic  mucosa and terminal ileum mucosa appeared normal.    COLONOSCOPY N/A 06/25/2015   Procedure: COLONOSCOPY;  Surgeon: Daneil Dolin, MD;  [facility];  Service: Endoscopy;  Laterality: N/A;  200-rescheduled 1/30 per [place]    COLONOSCOPY WITH PROPOFOL N/A 12/24/2020   Procedure: COLONOSCOPY WITH PROPOFOL;  Surgeon: Eloise Harman, DO;  [facility];  Service: Endoscopy;  Laterality: N/A;  ASA III / 8:00   CORONARY BALLOON ANGIOPLASTY N/A 04/24/2017   Procedure: CORONARY BALLOON ANGIOPLASTY;  Surgeon: Jettie Booze, MD;  Location: [place];  Service: Cardiovascular;  Laterality: N/A;   LEFT HEART CATH AND CORONARY ANGIOGRAPHY N/A 04/24/2017   Procedure: LEFT HEART CATH AND CORONARY ANGIOGRAPHY;  Surgeon: Jettie Booze, MD;  Location: [facility];  Service: Cardiovascular;  Laterality: N/A;   LUMBAR LAMINECTOMY  06/02/2012   LUMBAR LAMINECTOMY/DECOMPRESSION MICRODISCECTOMY  06/02/2012   Procedure: LUMBAR LAMINECTOMY/DECOMPRESSION MICRODISCECTOMY 1 LEVEL;  Surgeon: Floyce Stakes, MD;  Location: [place] [facility];  Service: Neurosurgery;  Laterality: Bilateral;  Bilateral Lumbar five-sacral one Foraminotomy and microdiskectomy    POLYPECTOMY  12/24/2020   Procedure: POLYPECTOMY;  Surgeon: Eloise Harman, DO;  [facility];  Service: Endoscopy;;   TOTAL KNEE ARTHROPLASTY Right 05/16/2022   Procedure: TOTAL KNEE ARTHROPLASTY;  Surgeon:  Earlie Server, MD;  [facility];  Service: Orthopedics;  Laterality: Right;   Patient Active Problem List   Diagnosis Date Noted   Type 2 diabetes mellitus with stage 3b chronic kidney disease, without long-term current use of insulin ([place]) 05/29/2022   Hospital discharge follow-up 05/29/2022   Hyperkalemia 05/19/2022   Acute respiratory failure with hypoxia ([place]) 05/18/2022   S/P total knee arthroplasty, right 05/18/2022   COPD with acute exacerbation ([place]) 05/18/2022   Primary localized  osteoarthritis of right knee 05/16/2022   Encounter for general adult medical examination with abnormal findings 05/12/2022   Preop examination 02/21/2022   Leg swelling 02/21/2022   CAD (coronary artery disease) 09/02/2021   Prediabetes 06/15/2020   Hyperlipidemia 04/27/2017   NSTEMI (non-ST elevated myocardial infarction) ([place]) 04/24/2017   History of colonic polyps    Diverticulosis of colon without hemorrhage    Family history of colon cancer 06/11/2015   Chronic foot pain 03/08/2015   Idiopathic peripheral neuropathy 05/31/2014   Lumbar radiculopathy 11/21/2013   Pain in joint, shoulder region 07/21/2012   Nocturnal muscle cramps 01/19/2012   Hypertension 11/10/2011   CKD (chronic kidney disease) 11/10/2011   Osteoarthritis of knee 09/16/2011    PCP: Ihor Dow MD  REFERRING PROVIDER: Earlie Server, MD  REFERRING DIAG: right total knee replacement  THERAPY DIAG:  Right knee pain, unspecified chronicity  Stiffness of right knee, not elsewhere classified  Other abnormalities of gait and mobility  Rationale for Evaluation and Treatment: Rehabilitation  ONSET DATE: 05/16/22  SUBJECTIVE:   SUBJECTIVE STATEMENT: Knee was very stiff this morning. Ankle feels swollen and hurts a little bit today.   PERTINENT HISTORY: RT TKA  05/16/22  PAIN:  Are you having pain? Yes: NPRS scale: 3-4/10 Pain location: Right knee Pain description: stiff, aching  Aggravating factors: bending, prolonged position Relieving factors: getting moving  PRECAUTIONS: None  WEIGHT BEARING RESTRICTIONS: No  FALLS:  Has patient fallen in last 6 months? No  LIVING ENVIRONMENT: Lives with: lives with their family and lives with their spouse Lives in: House/apartment Stairs: Yes: External: 2 steps; none Has following equipment at home: Single point cane and Environmental consultant - 2 wheeled  OCCUPATION: Retired, part time driver   PLOF: Independent  PATIENT GOALS: To get better   NEXT MD  VISIT:   OBJECTIVE:   DIAGNOSTIC FINDINGS: NA  PATIENT SURVEYS:  FOTO 47% function   COGNITION: Overall cognitive status: Within functional limits for tasks assessed     SENSATION: WFL  EDEMA:  Minimal    LOWER EXTREMITY ROM:  Active ROM Right eval Left eval Right 06/10/22  Hip flexion     Hip extension     Hip abduction     Hip adduction     Hip internal rotation     Hip external rotation     Knee flexion 92 115 105  Knee extension -5 0 -3  Ankle dorsiflexion     Ankle plantarflexion     Ankle inversion     Ankle eversion      (Blank rows = not tested)  LOWER EXTREMITY MMT:  MMT Right eval Left eval  Hip flexion 4+ 5  Hip extension    Hip abduction    Hip adduction    Hip internal rotation    Hip external rotation    Knee flexion 4+ 5  Knee extension 4 5  Ankle dorsiflexion 5 5  Ankle plantarflexion    Ankle inversion    Ankle eversion     (  Blank rows = not tested)   FUNCTIONAL TESTS:  2 minute walk test: 280 feet with RW   GAIT: Decreased stride, decreased hip and knee flexion of RT, using RW  TODAY'S TREATMENT:                                                                                                                              DATE:  06/19/22 Rec bike lv 2 5 min (seat 15)  Slant board 3 x 30" Knee drives 8 inch step 10 x 10"   HR x 20 TR x 20  Sit to stand 2 x 10 6 inch step up 2 x 10    Standing hip abduction RTB 2 x 10 Standing hip extension RTB 2 x 10 Tandem stance 3 x 20"  Supine heel slide AAROM with strap 10 x 10" Supine hamstring curl 3# x10   RT knee AROM -3 to 105 deg (achieved 112 deg AAROM)  06/17/22 Rec bike lv 2 5 min (seat 14)  Slant board 3 x 30" HR x 20 TR x 20  Sit to stand x 10 4 inch step up 2 x 10  Standing hip abduction 2 x 10 Tandem stance 3 x 20"  Clinic amb 150 feet with no AD    Manual PROM RT knee flexion/ extension   RT knee AROM (-3 to 107 deg)   PATIENT EDUCATION:  Education  details: on eval findings, POC and HEP  Person educated: Patient Education method: Explanation Education comprehension: verbalized understanding  HOME EXERCISE PROGRAM: Access Code: [email or web address] URL: [email or web address]  06/19/22 - Supine Heel Slide with Strap  - 3 x daily - 7 x weekly - 2 sets - 10 reps - 5 second hold - Heel Raises with Counter Support  - 3 x daily - 7 x weekly - 2 sets - 10 reps - Sit to Stand with Counter Support  - 3 x daily - 7 x weekly - 1-2 sets - 10 reps - Standing Tandem Balance with Counter Support  - 3 x daily - 7 x weekly - 1 sets - 3 reps - 30 second hold - Standing Knee Flexion with Counter Support  - 3 x daily - 7 x weekly - 2 sets - 10 reps   Date: 06/05/2022 Prepared by: Josue Hector  Exercises - Supine Quad Set  - 3 x daily - 7 x weekly - 2 sets - 10 reps - 5 second hold - Supine Active Straight Leg Raise  - 3 x daily - 7 x weekly - 2 sets - 10 reps - Supine Heel Slide  - 3 x daily - 7 x weekly - 2 sets - 10 reps - 5 second hold - Supine Bridge  - 3 x daily - 7 x weekly - 2 sets - 10 reps  ASSESSMENT:  CLINICAL IMPRESSION: Patient struggling with knee ROM. Progressed mobility exercises as well as LE strengthening. Educated patient on importance of  regular compliance with stretching and HEP. Added to HEP and issued updated handout. Patient will continue to benefit from skilled therapy services to reduce remaining deficits and improve functional ability.    OBJECTIVE IMPAIRMENTS: Abnormal gait, decreased activity tolerance, decreased mobility, difficulty walking, decreased ROM, decreased strength, hypomobility, increased edema, impaired flexibility, improper body mechanics, and pain.   ACTIVITY LIMITATIONS: carrying, lifting, bending, sitting, standing, squatting, sleeping, stairs, transfers, bed mobility, and locomotion level  PARTICIPATION LIMITATIONS: meal prep, cleaning, laundry, driving, shopping, community activity, occupation,  and yard work  PERSONAL FACTORS:  NA  are also affecting patient's functional outcome.   REHAB POTENTIAL: Good  CLINICAL DECISION MAKING: Stable/uncomplicated  EVALUATION COMPLEXITY: Low   GOALS: SHORT TERM GOALS: Target date: 06/26/2022  Patient will be independent with initial HEP and self-management strategies to improve functional outcomes Baseline:  Goal status: IN PROGRESS   LONG TERM GOALS: Target date: 07/17/2022  Patient will be independent with advanced HEP and self-management strategies to improve functional outcomes Baseline:  Goal status: IN PROGRESS  2.  Patient will improve FOTO score to predicted value to indicate improvement in functional outcomes Baseline: 47% function  Goal status: IN PROGRESS  3.  Patient will have RT knee AROM 0-120 degrees to improve functional mobility and facilitate squatting to pick up items from floor. Baseline: -5 to 92 deg Goal status: IN PROGRESS  4. Patient will have equal to or > 4+/5 MMT throughout BLE to improve ability to perform functional mobility, stair ambulation and ADLs.  Baseline: See MMT Goal status: IN PROGRESS  5. Patient will be able to ambulate at least 350 feet during 2MWT with LRAD to demonstrate improved ability to perform functional mobility and associated tasks. Baseline: 280 feet with RW Goal status: IN PROGRESS  PLAN:  PT FREQUENCY: 2x/week  PT DURATION: 6 weeks  PLANNED INTERVENTIONS: Therapeutic exercises, Therapeutic activity, Neuromuscular re-education, Balance training, Gait training, Patient/Family education, Joint manipulation, Joint mobilization, Stair training, Aquatic Therapy, Dry Needling, Electrical stimulation, Spinal manipulation, Spinal mobilization, Cryotherapy, Moist heat, scar mobilization, Taping, Traction, Ultrasound, Biofeedback, Ionotophoresis '4mg'$ /ml Dexamethasone, and Manual therapy.   PLAN FOR NEXT SESSION: Progress knee ROM, glute and quad strengthening as tolerated.  3:47 PM,  06/19/22 Josue Hector PT DPT  Physical Therapist with [facility]  [phone]

## 2022-06-24 ENCOUNTER — Ambulatory Visit (HOSPITAL_COMMUNITY): Payer: Medicare Other | Admitting: Physical Therapy

## 2022-06-24 DIAGNOSIS — M25561 Pain in right knee: Secondary | ICD-10-CM

## 2022-06-24 DIAGNOSIS — M25661 Stiffness of right knee, not elsewhere classified: Secondary | ICD-10-CM

## 2022-06-24 DIAGNOSIS — R2689 Other abnormalities of gait and mobility: Secondary | ICD-10-CM

## 2022-06-24 NOTE — Therapy (Signed)
OUTPATIENT PHYSICAL THERAPY TREATMENT   Patient Name: Kyle Patel. MRN: [id number] DOB:Jul 23, 1945, 77 y.o., male Today's Date: 06/24/2022  END OF SESSION:  PT End of Session - 06/24/22 1311     Visit Number 6    Number of Visits 12    Date for PT Re-Evaluation 07/17/22    Authorization Type [employer] Medicare    Progress Note Due on Visit 10    PT Start Time 1305   had to wait on check-in   PT Stop Time 1345    PT Time Calculation (min) 40 min    Activity Tolerance Patient tolerated treatment well    Behavior During Therapy WFL for tasks assessed/performed             Past Medical History:  Diagnosis Date   Acute anterior wall MI ([place]) 04/24/2017   Arthritis    AVN of femur ([place])    CAD (coronary artery disease)    a. s/p NSTEMI in 03/2017 with 100% distal LAD stenosis treated with POBA as stent could not be delivered   Chronic kidney disease    COPD (chronic obstructive pulmonary disease) ([place])    Essential hypertension    Hereditary and idiopathic peripheral neuropathy 05/2014   Hyperlipidemia    Pulmonary embolism ([place]) 10/2011   Past Surgical History:  Procedure Laterality Date   ABDOMINAL SURGERY     colon polyp removed 2007- sepsis, coma for 4 months per pt   BIOPSY  12/24/2020   Procedure: BIOPSY;  Surgeon: Eloise Harman, DO;  [facility];  Service: Endoscopy;;   CATARACT EXTRACTION W/PHACO Right 08/19/2021   Procedure: CATARACT EXTRACTION PHACO AND INTRAOCULAR LENS PLACEMENT ([place]);  Surgeon: Baruch Goldmann, MD;  [facility];  Service: Ophthalmology;  Laterality: Right;  CDE: 7.02    CATARACT EXTRACTION W/PHACO Left 09/30/2021   Procedure: CATARACT EXTRACTION PHACO AND INTRAOCULAR LENS PLACEMENT ([facility]);  Surgeon: Baruch Goldmann, MD;  [facility];  Service: Ophthalmology;  Laterality: Left;  CDE 4.61   COLONOSCOPY  03/22/2010   RMR: 1. Normal rectum 2. Diminutive polyp at the mouth of the ileocecal valve  status post cold biopsy removal.  Remaideer of the colonic mucosa and terminal ileum mucosa appeared normal.    COLONOSCOPY N/A 06/25/2015   Procedure: COLONOSCOPY;  Surgeon: Daneil Dolin, MD;  [facility];  Service: Endoscopy;  Laterality: N/A;  200-rescheduled 1/30 per [place]    COLONOSCOPY WITH PROPOFOL N/A 12/24/2020   Procedure: COLONOSCOPY WITH PROPOFOL;  Surgeon: Eloise Harman, DO;  [facility];  Service: Endoscopy;  Laterality: N/A;  ASA III / 8:00   CORONARY BALLOON ANGIOPLASTY N/A 04/24/2017   Procedure: CORONARY BALLOON ANGIOPLASTY;  Surgeon: Jettie Booze, MD;  Location: [facility];  Service: Cardiovascular;  Laterality: N/A;   LEFT HEART CATH AND CORONARY ANGIOGRAPHY N/A 04/24/2017   Procedure: LEFT HEART CATH AND CORONARY ANGIOGRAPHY;  Surgeon: Jettie Booze, MD;  Location: [facility];  Service: Cardiovascular;  Laterality: N/A;   LUMBAR LAMINECTOMY  06/02/2012   LUMBAR LAMINECTOMY/DECOMPRESSION MICRODISCECTOMY  06/02/2012   Procedure: LUMBAR LAMINECTOMY/DECOMPRESSION MICRODISCECTOMY 1 LEVEL;  Surgeon: Floyce Stakes, MD;  Location: [facility];  Service: Neurosurgery;  Laterality: Bilateral;  Bilateral Lumbar five-sacral one Foraminotomy and microdiskectomy    POLYPECTOMY  12/24/2020   Procedure: POLYPECTOMY;  Surgeon: Eloise Harman, DO;  [facility];  Service: Endoscopy;;   TOTAL KNEE ARTHROPLASTY Right 05/16/2022  Procedure: TOTAL KNEE ARTHROPLASTY;  Surgeon: Earlie Server, MD;  [facility];  Service: Orthopedics;  Laterality: Right;   Patient Active Problem List   Diagnosis Date Noted   Type 2 diabetes mellitus with stage 3b chronic kidney disease, without long-term current use of insulin ([place]) 05/29/2022   Hospital discharge follow-up 05/29/2022   Hyperkalemia 05/19/2022   Acute respiratory failure with hypoxia ([place]) 05/18/2022   S/P total knee arthroplasty, right 05/18/2022   COPD with acute exacerbation ([place]) 05/18/2022    Primary localized osteoarthritis of right knee 05/16/2022   Encounter for general adult medical examination with abnormal findings 05/12/2022   Preop examination 02/21/2022   Leg swelling 02/21/2022   CAD (coronary artery disease) 09/02/2021   Prediabetes 06/15/2020   Hyperlipidemia 04/27/2017   NSTEMI (non-ST elevated myocardial infarction) ([place]) 04/24/2017   History of colonic polyps    Diverticulosis of colon without hemorrhage    Family history of colon cancer 06/11/2015   Chronic foot pain 03/08/2015   Idiopathic peripheral neuropathy 05/31/2014   Lumbar radiculopathy 11/21/2013   Pain in joint, shoulder region 07/21/2012   Nocturnal muscle cramps 01/19/2012   Hypertension 11/10/2011   CKD (chronic kidney disease) 11/10/2011   Osteoarthritis of knee 09/16/2011    PCP: Ihor Dow MD  REFERRING PROVIDER: Earlie Server, MD  REFERRING DIAG: right total knee replacement  THERAPY DIAG:  Right knee pain, unspecified chronicity  Stiffness of right knee, not elsewhere classified  Other abnormalities of gait and mobility  Rationale for Evaluation and Treatment: Rehabilitation  ONSET DATE: 05/16/22  SUBJECTIVE:   SUBJECTIVE STATEMENT: Pt reports same stiffness but no pain. Continues to use SPC with ambulation.    PERTINENT HISTORY: RT TKA  05/16/22  PAIN:  Are you having pain? Yes: NPRS scale: 3-4/10 Pain location: Right knee Pain description: stiff, aching  Aggravating factors: bending, prolonged position Relieving factors: getting moving  PRECAUTIONS: None  WEIGHT BEARING RESTRICTIONS: No  FALLS:  Has patient fallen in last 6 months? No  LIVING ENVIRONMENT: Lives with: lives with their family and lives with their spouse Lives in: House/apartment Stairs: Yes: External: 2 steps; none Has following equipment at home: Single point cane and Environmental consultant - 2 wheeled  OCCUPATION: Retired, part time driver   PLOF: Independent  PATIENT GOALS: To get better    NEXT MD VISIT:   OBJECTIVE:   DIAGNOSTIC FINDINGS: NA  PATIENT SURVEYS:  FOTO 47% function   COGNITION: Overall cognitive status: Within functional limits for tasks assessed     SENSATION: WFL  EDEMA:  Minimal    LOWER EXTREMITY ROM:  Active ROM Right eval Left eval Right 06/10/22  Hip flexion     Hip extension     Hip abduction     Hip adduction     Hip internal rotation     Hip external rotation     Knee flexion 92 115 105  Knee extension -5 0 -3  Ankle dorsiflexion     Ankle plantarflexion     Ankle inversion     Ankle eversion      (Blank rows = not tested)  LOWER EXTREMITY MMT:  MMT Right eval Left eval  Hip flexion 4+ 5  Hip extension    Hip abduction    Hip adduction    Hip internal rotation    Hip external rotation    Knee flexion 4+ 5  Knee extension 4 5  Ankle dorsiflexion 5 5  Ankle plantarflexion    Ankle inversion  Ankle eversion     (Blank rows = not tested)   FUNCTIONAL TESTS:  2 minute walk test: 280 feet with RW   GAIT: Decreased stride, decreased hip and knee flexion of RT, using RW  TODAY'S TREATMENT:                                                                                                                              DATE:  06/24/22 Rec bike lv 2 5 min (seat 15)  Slant board 3 x 30" Heelraise with incline x 20 Toeraise with incline x 20  6 inch forward step up 2 x 10  with 1 UE 4 inch lateral step up with heel taps 2X10 with 1 UE Standing hip abduction GTB 2 x 10 Standing hip extension GTB 2 x 10 Tandem stance 3 x 20" Vectors 10X5" with bil UE assist Sit to stand 2 x 10  06/19/22 Rec bike lv 2 5 min (seat 16)  Slant board 3 x 30" Knee drives 8 inch step 10 x 10"   HR x 20 TR x 20  Sit to stand 2 x 10 6 inch step up 2 x 10    Standing hip abduction RTB 2 x 10 Standing hip extension RTB 2 x 10 Tandem stance 3 x 20"  Supine heel slide AAROM with strap 10 x 10" Supine hamstring curl 3# x10   RT  knee AROM -3 to 105 deg (achieved 112 deg AAROM)  06/17/22 Rec bike lv 2 5 min (seat 14)  Slant board 3 x 30" HR x 20 TR x 20  Sit to stand x 10 4 inch step up 2 x 10  Standing hip abduction 2 x 10 Tandem stance 3 x 20"  Clinic amb 150 feet with no AD    Manual PROM RT knee flexion/ extension   RT knee AROM (-3 to 107 deg)   PATIENT EDUCATION:  Education details: on eval findings, POC and HEP  Person educated: Patient Education method: Explanation Education comprehension: verbalized understanding  HOME EXERCISE PROGRAM: Access Code: [email or web address] URL: [email or web address]  06/19/22 - Supine Heel Slide with Strap  - 3 x daily - 7 x weekly - 2 sets - 10 reps - 5 second hold - Heel Raises with Counter Support  - 3 x daily - 7 x weekly - 2 sets - 10 reps - Sit to Stand with Counter Support  - 3 x daily - 7 x weekly - 1-2 sets - 10 reps - Standing Tandem Balance with Counter Support  - 3 x daily - 7 x weekly - 1 sets - 3 reps - 30 second hold - Standing Knee Flexion with Counter Support  - 3 x daily - 7 x weekly - 2 sets - 10 reps   Date: 06/05/2022 Prepared by: Josue Hector  Exercises - Supine Quad Set  - 3 x daily - 7 x weekly - 2 sets - 10 reps -  5 second hold - Supine Active Straight Leg Raise  - 3 x daily - 7 x weekly - 2 sets - 10 reps - Supine Heel Slide  - 3 x daily - 7 x weekly - 2 sets - 10 reps - 5 second hold - Supine Bridge  - 3 x daily - 7 x weekly - 2 sets - 10 reps  ASSESSMENT:  CLINICAL IMPRESSION: Patient able to complete forward step ups with 6" and 1 UE, however lateral steps too challenging with eccentric control to complete in good form.  Decreased step height to 4" with this activity.  Also difficulty with single leg stability on Rt without bil UE assist completing vectors.  PT able to complete one full repeititon but then knee began to buckle. Pt tends to have trouble remembering exercise technqiue/form.  Tactile and verbal cues required  with most exercises today.   Patient will continue to benefit from skilled therapy services to reduce remaining deficits and improve functional ability.    OBJECTIVE IMPAIRMENTS: Abnormal gait, decreased activity tolerance, decreased mobility, difficulty walking, decreased ROM, decreased strength, hypomobility, increased edema, impaired flexibility, improper body mechanics, and pain.   ACTIVITY LIMITATIONS: carrying, lifting, bending, sitting, standing, squatting, sleeping, stairs, transfers, bed mobility, and locomotion level  PARTICIPATION LIMITATIONS: meal prep, cleaning, laundry, driving, shopping, community activity, occupation, and yard work  PERSONAL FACTORS:  NA  are also affecting patient's functional outcome.   REHAB POTENTIAL: Good  CLINICAL DECISION MAKING: Stable/uncomplicated  EVALUATION COMPLEXITY: Low   GOALS: SHORT TERM GOALS: Target date: 06/26/2022  Patient will be independent with initial HEP and self-management strategies to improve functional outcomes Baseline:  Goal status: IN PROGRESS   LONG TERM GOALS: Target date: 07/17/2022  Patient will be independent with advanced HEP and self-management strategies to improve functional outcomes Baseline:  Goal status: IN PROGRESS  2.  Patient will improve FOTO score to predicted value to indicate improvement in functional outcomes Baseline: 47% function  Goal status: IN PROGRESS  3.  Patient will have RT knee AROM 0-120 degrees to improve functional mobility and facilitate squatting to pick up items from floor. Baseline: -5 to 92 deg Goal status: IN PROGRESS  4. Patient will have equal to or > 4+/5 MMT throughout BLE to improve ability to perform functional mobility, stair ambulation and ADLs.  Baseline: See MMT Goal status: IN PROGRESS  5. Patient will be able to ambulate at least 350 feet during 2MWT with LRAD to demonstrate improved ability to perform functional mobility and associated tasks. Baseline: 280  feet with RW Goal status: IN PROGRESS  PLAN:  PT FREQUENCY: 2x/week  PT DURATION: 6 weeks  PLANNED INTERVENTIONS: Therapeutic exercises, Therapeutic activity, Neuromuscular re-education, Balance training, Gait training, Patient/Family education, Joint manipulation, Joint mobilization, Stair training, Aquatic Therapy, Dry Needling, Electrical stimulation, Spinal manipulation, Spinal mobilization, Cryotherapy, Moist heat, scar mobilization, Taping, Traction, Ultrasound, Biofeedback, Ionotophoresis '4mg'$ /ml Dexamethasone, and Manual therapy.   PLAN FOR NEXT SESSION: Progress knee ROM, glute and quad strengthening as tolerated.  3:38 PM, 06/24/22 Teena Irani, PTA/CLT [place] Ph: [phone]

## 2022-06-26 ENCOUNTER — Ambulatory Visit (HOSPITAL_COMMUNITY): Payer: Medicare Other | Attending: Orthopedic Surgery | Admitting: Physical Therapy

## 2022-06-26 DIAGNOSIS — M25661 Stiffness of right knee, not elsewhere classified: Secondary | ICD-10-CM | POA: Diagnosis present

## 2022-06-26 DIAGNOSIS — M25561 Pain in right knee: Secondary | ICD-10-CM | POA: Insufficient documentation

## 2022-06-26 DIAGNOSIS — R2689 Other abnormalities of gait and mobility: Secondary | ICD-10-CM | POA: Diagnosis present

## 2022-06-26 NOTE — Therapy (Signed)
OUTPATIENT PHYSICAL THERAPY TREATMENT   Patient Name: Kyle Patel. 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 s/p NSTEMI in 03/2017 with 100% distal LAD stenosis treated with POBA as stent could not be delivered   Chronic kidney disease    COPD (chronic obstructive pulmonary disease) ([place])    Essential hypertension    Hereditary and idiopathic peripheral neuropathy 05/2014   Hyperlipidemia    Pulmonary embolism ([place]) 10/2011   Past Surgical History:  Procedure Laterality Date   ABDOMINAL SURGERY     colon polyp removed 2007- sepsis, coma for 4 months per pt   BIOPSY  12/24/2020   Procedure: BIOPSY;  Surgeon: Eloise Harman, DO;  [facility];  Service: Endoscopy;;   CATARACT EXTRACTION W/PHACO Right 08/19/2021   Procedure: CATARACT EXTRACTION PHACO AND INTRAOCULAR LENS PLACEMENT ([place]);  Surgeon: Baruch Goldmann, MD;  Location: [facility];  Service: Ophthalmology;  Laterality: Right;  CDE: 7.02    CATARACT EXTRACTION W/PHACO Left 09/30/2021   Procedure: CATARACT EXTRACTION PHACO AND INTRAOCULAR LENS PLACEMENT ([facility]);  Surgeon: Baruch Goldmann, MD;  Location: [facility];  Service: Ophthalmology;  Laterality: Left;  CDE 4.61   COLONOSCOPY  03/22/2010   RMR: 1. Normal rectum 2. Diminutive polyp at the mouth of the ileocecal valve  status post cold biopsy removal. Remaideer of the colonic  mucosa and terminal ileum mucosa appeared normal.    COLONOSCOPY N/A 06/25/2015   Procedure: COLONOSCOPY;  Surgeon: Daneil Dolin, MD;  [facility];  Service: Endoscopy;  Laterality: N/A;  200-rescheduled 1/30 per [place]    COLONOSCOPY WITH PROPOFOL N/A 12/24/2020   Procedure: COLONOSCOPY WITH PROPOFOL;  Surgeon: Eloise Harman, DO;  [facility];  Service: Endoscopy;  Laterality: N/A;  ASA III / 8:00   CORONARY BALLOON ANGIOPLASTY N/A 04/24/2017   Procedure: CORONARY BALLOON ANGIOPLASTY;  Surgeon: Jettie Booze, MD;  Location: [facility];  Service: Cardiovascular;  Laterality: N/A;   LEFT HEART CATH AND CORONARY ANGIOGRAPHY N/A 04/24/2017   Procedure: LEFT HEART CATH AND CORONARY ANGIOGRAPHY;  Surgeon: Jettie Booze, MD;  Location: [facility];  Service: Cardiovascular;  Laterality: N/A;   LUMBAR LAMINECTOMY  06/02/2012   LUMBAR LAMINECTOMY/DECOMPRESSION MICRODISCECTOMY  06/02/2012   Procedure: LUMBAR LAMINECTOMY/DECOMPRESSION MICRODISCECTOMY 1 LEVEL;  Surgeon: Floyce Stakes, MD;  Location: [facility];  Service: Neurosurgery;  Laterality: Bilateral;  Bilateral Lumbar five-sacral one Foraminotomy and microdiskectomy    POLYPECTOMY  12/24/2020   Procedure: POLYPECTOMY;  Surgeon: Eloise Harman, DO;  [facility];  Service: Endoscopy;;   TOTAL KNEE ARTHROPLASTY Right 05/16/2022   Procedure: TOTAL KNEE ARTHROPLASTY;  Surgeon:  Earlie Server, MD;  Location: [facility];  Service: Orthopedics;  Laterality: Right;   Patient Active Problem List   Diagnosis Date Noted   Type 2 diabetes mellitus with stage 3b chronic kidney disease, without long-term current use of insulin ([place]) 05/29/2022   Hospital discharge follow-up 05/29/2022   Hyperkalemia 05/19/2022   Acute respiratory failure with hypoxia ([place]) 05/18/2022   S/P total knee arthroplasty, right 05/18/2022   COPD with acute exacerbation ([place]) 05/18/2022   Primary localized  osteoarthritis of right knee 05/16/2022   Encounter for general adult medical examination with abnormal findings 05/12/2022   Preop examination 02/21/2022   Leg swelling 02/21/2022   CAD (coronary artery disease) 09/02/2021   Prediabetes 06/15/2020   Hyperlipidemia 04/27/2017   NSTEMI (non-ST elevated myocardial infarction) ([place]) 04/24/2017   History of colonic polyps    Diverticulosis of colon without hemorrhage    Family history of colon cancer 06/11/2015   Chronic foot pain 03/08/2015   Idiopathic peripheral neuropathy 05/31/2014   Lumbar radiculopathy 11/21/2013   Pain in joint, shoulder region 07/21/2012   Nocturnal muscle cramps 01/19/2012   Hypertension 11/10/2011   CKD (chronic kidney disease) 11/10/2011   Osteoarthritis of knee 09/16/2011    PCP: Ihor Dow MD  REFERRING PROVIDER: Earlie Server, MD  REFERRING DIAG: right total knee replacement  THERAPY DIAG:  No diagnosis found.  Rationale for Evaluation and Treatment: Rehabilitation  ONSET DATE: 05/16/22  SUBJECTIVE:   SUBJECTIVE STATEMENT: Pt reports MD was pleased with progress.  Returns to MD in 6 weeks.  Uses no AD mostly around the house, only using [place] in community.  Currently with soreness but no reported pain.    PERTINENT HISTORY: RT TKA  05/16/22  PAIN:  Are you having pain? No  PRECAUTIONS: None  WEIGHT BEARING RESTRICTIONS: No  FALLS:  Has patient fallen in last 6 months? No  LIVING ENVIRONMENT: Lives with: lives with their family and lives with their spouse Lives in: House/apartment Stairs: Yes: External: 2 steps; none Has following equipment at home: Single point cane and Environmental consultant - 2 wheeled  OCCUPATION: Retired, part time driver   PLOF: Independent  PATIENT GOALS: To get better   NEXT MD VISIT:   OBJECTIVE:   DIAGNOSTIC FINDINGS: NA  PATIENT SURVEYS:  FOTO 47% function   COGNITION: Overall cognitive status: Within functional limits for tasks  assessed     SENSATION: WFL  EDEMA:  Minimal    LOWER EXTREMITY ROM:  Active ROM Right eval Left eval Right 06/10/22 Right 06/26/22  Hip flexion      Hip extension      Hip abduction      Hip adduction      Hip internal rotation      Hip external rotation      Knee flexion 92 115 105 113  Knee extension -5 0 -3 -3  Ankle dorsiflexion      Ankle plantarflexion      Ankle inversion      Ankle eversion       (Blank rows = not tested)  LOWER EXTREMITY MMT:  MMT Right eval Left eval  Hip flexion 4+ 5  Hip extension    Hip abduction    Hip adduction    Hip internal rotation    Hip external rotation    Knee flexion 4+ 5  Knee extension 4 5  Ankle dorsiflexion 5 5  Ankle plantarflexion    Ankle inversion    Ankle eversion     (  Blank rows = not tested)   FUNCTIONAL TESTS:  2 minute walk test: 280 feet with RW   GAIT: Decreased stride, decreased hip and knee flexion of RT, using RW  TODAY'S TREATMENT:                                                                                                                              DATE:  06/26/22 Rec bike lv 2 5 min (seat 14)  Standing:  Rt knee flexion stretch with 8" box 10X10" 6 inch forward step up 2 x 10  with 1 UE 4 inch lateral step up with heel taps 2X10 with 1 UE Standing hip abduction GTB 2 x 10 Standing hip extension GTB 2 x 10 Tandem stance 2x30" on balance beam foam Vectors 10X5" with standing on Rt only 1 UE assist Sit to stand 2x10 no UE Supine:  AAROM, soft tissue mobilization, AROM 3-113 Rt knee  06/24/22 Rec bike lv 2 5 min (seat 15)  Slant board 3 x 30" Heelraise with incline x 20 Toeraise with incline x 20  6 inch forward step up 2 x 10  with 1 UE 4 inch lateral step up with heel taps 2X10 with 1 UE Standing hip abduction GTB 2 x 10 Standing hip extension GTB 2 x 10 Tandem stance 3 x 20" Vectors 10X5" with bil UE assist Sit to stand 2 x 10  06/19/22 Rec bike lv 2 5 min (seat 16)  Slant  board 3 x 30" Knee drives 8 inch step 10 x 10"   HR x 20 TR x 20  Sit to stand 2 x 10 6 inch step up 2 x 10    Standing hip abduction RTB 2 x 10 Standing hip extension RTB 2 x 10 Tandem stance 3 x 20"  Supine heel slide AAROM with strap 10 x 10" Supine hamstring curl 3# x10   RT knee AROM -3 to 105 deg (achieved 112 deg AAROM)  06/17/22 Rec bike lv 2 5 min (seat 14)  Slant board 3 x 30" HR x 20 TR x 20  Sit to stand x 10 4 inch step up 2 x 10  Standing hip abduction 2 x 10 Tandem stance 3 x 20"  Clinic amb 150 feet with no AD    Manual PROM RT knee flexion/ extension   RT knee AROM (-3 to 107 deg)   PATIENT EDUCATION:  Education details: on eval findings, POC and HEP  Person educated: Patient Education method: Explanation Education comprehension: verbalized understanding  HOME EXERCISE PROGRAM: Access Code: [email or web address] URL: [email or web address]  06/19/22 - Supine Heel Slide with Strap  - 3 x daily - 7 x weekly - 2 sets - 10 reps - 5 second hold - Heel Raises with Counter Support  - 3 x daily - 7 x weekly - 2 sets - 10 reps - Sit to Stand with Counter Support  - 3 x daily - 7  x weekly - 1-2 sets - 10 reps - Standing Tandem Balance with Counter Support  - 3 x daily - 7 x weekly - 1 sets - 3 reps - 30 second hold - Standing Knee Flexion with Counter Support  - 3 x daily - 7 x weekly - 2 sets - 10 reps   Date: 06/05/2022 Prepared by: Josue Hector  Exercises - Supine Quad Set  - 3 x daily - 7 x weekly - 2 sets - 10 reps - 5 second hold - Supine Active Straight Leg Raise  - 3 x daily - 7 x weekly - 2 sets - 10 reps - Supine Heel Slide  - 3 x daily - 7 x weekly - 2 sets - 10 reps - 5 second hold - Supine Bridge  - 3 x daily - 7 x weekly - 2 sets - 10 reps  ASSESSMENT:  CLINICAL IMPRESSION: Continued with therex progression to challenge Rt LE strength and stability.  Reduction of UE assist with vectors with noted challenge, however able to complete  all 10 reps as was unable due to knee buckling last visit.  Increased difficulty of tandem stance with use of foam balance beam. Pt required min guard as with several LOB during this task. Tactile and verbal cues required with most exercises today.  Soft tissue mobilization to scar tissue and ROM measured at 3-113 at end of session.   Patient will continue to benefit from skilled therapy services to reduce remaining deficits and improve functional ability.    OBJECTIVE IMPAIRMENTS: Abnormal gait, decreased activity tolerance, decreased mobility, difficulty walking, decreased ROM, decreased strength, hypomobility, increased edema, impaired flexibility, improper body mechanics, and pain.   ACTIVITY LIMITATIONS: carrying, lifting, bending, sitting, standing, squatting, sleeping, stairs, transfers, bed mobility, and locomotion level  PARTICIPATION LIMITATIONS: meal prep, cleaning, laundry, driving, shopping, community activity, occupation, and yard work  PERSONAL FACTORS:  NA  are also affecting patient's functional outcome.   REHAB POTENTIAL: Good  CLINICAL DECISION MAKING: Stable/uncomplicated  EVALUATION COMPLEXITY: Low   GOALS: SHORT TERM GOALS: Target date: 06/26/2022  Patient will be independent with initial HEP and self-management strategies to improve functional outcomes Baseline:  Goal status: IN PROGRESS   LONG TERM GOALS: Target date: 07/17/2022  Patient will be independent with advanced HEP and self-management strategies to improve functional outcomes Baseline:  Goal status: IN PROGRESS  2.  Patient will improve FOTO score to predicted value to indicate improvement in functional outcomes Baseline: 47% function  Goal status: IN PROGRESS  3.  Patient will have RT knee AROM 0-120 degrees to improve functional mobility and facilitate squatting to pick up items from floor. Baseline: -5 to 92 deg Goal status: IN PROGRESS  4. Patient will have equal to or > 4+/5 MMT throughout  BLE to improve ability to perform functional mobility, stair ambulation and ADLs.  Baseline: See MMT Goal status: IN PROGRESS  5. Patient will be able to ambulate at least 350 feet during 2MWT with LRAD to demonstrate improved ability to perform functional mobility and associated tasks. Baseline: 280 feet with RW Goal status: IN PROGRESS  PLAN:  PT FREQUENCY: 2x/week  PT DURATION: 6 weeks  PLANNED INTERVENTIONS: Therapeutic exercises, Therapeutic activity, Neuromuscular re-education, Balance training, Gait training, Patient/Family education, Joint manipulation, Joint mobilization, Stair training, Aquatic Therapy, Dry Needling, Electrical stimulation, Spinal manipulation, Spinal mobilization, Cryotherapy, Moist heat, scar mobilization, Taping, Traction, Ultrasound, Biofeedback, Ionotophoresis '4mg'$ /ml Dexamethasone, and Manual therapy.   PLAN FOR NEXT SESSION: Progress knee ROM,  glute and quad strengthening as tolerated.   2:59 PM, 06/26/22 Teena Irani, PTA/CLT [place] Ph: [phone]

## 2022-07-01 ENCOUNTER — Ambulatory Visit (HOSPITAL_COMMUNITY): Payer: Medicare Other

## 2022-07-01 DIAGNOSIS — R2689 Other abnormalities of gait and mobility: Secondary | ICD-10-CM

## 2022-07-01 DIAGNOSIS — M25661 Stiffness of right knee, not elsewhere classified: Secondary | ICD-10-CM

## 2022-07-01 DIAGNOSIS — M25561 Pain in right knee: Secondary | ICD-10-CM

## 2022-07-01 NOTE — Therapy (Signed)
OUTPATIENT PHYSICAL THERAPY TREATMENT   Patient Name: Kyle Patel. 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 s/p NSTEMI in 03/2017 with 100% distal LAD stenosis treated with POBA as stent could not be delivered   Chronic kidney disease    COPD (chronic obstructive pulmonary disease) ([place])    Essential hypertension    Hereditary and idiopathic peripheral neuropathy 05/2014   Hyperlipidemia    Pulmonary embolism ([place]) 10/2011   Past Surgical History:  Procedure Laterality Date   ABDOMINAL SURGERY     colon polyp removed 2007- sepsis, coma for 4 months per pt   BIOPSY  12/24/2020   Procedure: BIOPSY;  Surgeon: Eloise Harman, DO;  [facility];  Service: Endoscopy;;   CATARACT EXTRACTION W/PHACO Right 08/19/2021   Procedure: CATARACT EXTRACTION PHACO AND INTRAOCULAR LENS PLACEMENT ([place]);  Surgeon: Baruch Goldmann, MD;  Location: [facility];  Service: Ophthalmology;  Laterality: Right;  CDE: 7.02    CATARACT EXTRACTION W/PHACO Left 09/30/2021   Procedure: CATARACT EXTRACTION PHACO AND INTRAOCULAR LENS PLACEMENT ([facility]);  Surgeon: Baruch Goldmann, MD;  Location: [facility];  Service: Ophthalmology;  Laterality: Left;  CDE 4.61   COLONOSCOPY  03/22/2010   RMR: 1. Normal rectum 2. Diminutive polyp at the mouth of the ileocecal valve  status post cold biopsy removal. Remaideer of the colonic  mucosa and terminal ileum mucosa appeared normal.    COLONOSCOPY N/A 06/25/2015   Procedure: COLONOSCOPY;  Surgeon: Daneil Dolin, MD;  [facility];  Service: Endoscopy;  Laterality: N/A;  200-rescheduled 1/30 per [place]    COLONOSCOPY WITH PROPOFOL N/A 12/24/2020   Procedure: COLONOSCOPY WITH PROPOFOL;  Surgeon: Eloise Harman, DO;  [facility];  Service: Endoscopy;  Laterality: N/A;  ASA III / 8:00   CORONARY BALLOON ANGIOPLASTY N/A 04/24/2017   Procedure: CORONARY BALLOON ANGIOPLASTY;  Surgeon: Jettie Booze, MD;  Location: [facility];  Service: Cardiovascular;  Laterality: N/A;   LEFT HEART CATH AND CORONARY ANGIOGRAPHY N/A 04/24/2017   Procedure: LEFT HEART CATH AND CORONARY ANGIOGRAPHY;  Surgeon: Jettie Booze, MD;  Location: [facility];  Service: Cardiovascular;  Laterality: N/A;   LUMBAR LAMINECTOMY  06/02/2012   LUMBAR LAMINECTOMY/DECOMPRESSION MICRODISCECTOMY  06/02/2012   Procedure: LUMBAR LAMINECTOMY/DECOMPRESSION MICRODISCECTOMY 1 LEVEL;  Surgeon: Floyce Stakes, MD;  Location: [facility];  Service: Neurosurgery;  Laterality: Bilateral;  Bilateral Lumbar five-sacral one Foraminotomy and microdiskectomy    POLYPECTOMY  12/24/2020   Procedure: POLYPECTOMY;  Surgeon: Eloise Harman, DO;  [facility];  Service: Endoscopy;;   TOTAL KNEE ARTHROPLASTY Right 05/16/2022   Procedure: TOTAL KNEE ARTHROPLASTY;  Surgeon:  Earlie Server, MD;  Location: [facility];  Service: Orthopedics;  Laterality: Right;   Patient Active Problem List   Diagnosis Date Noted   Type 2 diabetes mellitus with stage 3b chronic kidney disease, without long-term current use of insulin ([place]) 05/29/2022   Hospital discharge follow-up 05/29/2022   Hyperkalemia 05/19/2022   Acute respiratory failure with hypoxia ([place]) 05/18/2022   S/P total knee arthroplasty, right 05/18/2022   COPD with acute exacerbation ([place]) 05/18/2022   Primary localized  osteoarthritis of right knee 05/16/2022   Encounter for general adult medical examination with abnormal findings 05/12/2022   Preop examination 02/21/2022   Leg swelling 02/21/2022   CAD (coronary artery disease) 09/02/2021   Prediabetes 06/15/2020   Hyperlipidemia 04/27/2017   NSTEMI (non-ST elevated myocardial infarction) ([place]) 04/24/2017   History of colonic polyps    Diverticulosis of colon without hemorrhage    Family history of colon cancer 06/11/2015   Chronic foot pain 03/08/2015   Idiopathic peripheral neuropathy 05/31/2014   Lumbar radiculopathy 11/21/2013   Pain in joint, shoulder region 07/21/2012   Nocturnal muscle cramps 01/19/2012   Hypertension 11/10/2011   CKD (chronic kidney disease) 11/10/2011   Osteoarthritis of knee 09/16/2011    PCP: Ihor Dow MD  REFERRING PROVIDER: Earlie Server, MD  REFERRING DIAG: right total knee replacement  THERAPY DIAG:  Right knee pain, unspecified chronicity  Stiffness of right knee, not elsewhere classified  Other abnormalities of gait and mobility  Rationale for Evaluation and Treatment: Rehabilitation  ONSET DATE: 05/16/22  SUBJECTIVE:   SUBJECTIVE STATEMENT: Patient reports knee is really stiff and sore today; 4/10 soreness PERTINENT HISTORY: RT TKA  05/16/22  PAIN:  Are you having pain? No  PRECAUTIONS: None  WEIGHT BEARING RESTRICTIONS: No  FALLS:  Has patient fallen in last 6 months? No  LIVING ENVIRONMENT: Lives with: lives with their family and lives with their spouse Lives in: House/apartment Stairs: Yes: External: 2 steps; none Has following equipment at home: Single point cane and Environmental consultant - 2 wheeled  OCCUPATION: Retired, part time driver   PLOF: Independent  PATIENT GOALS: To get better   NEXT MD VISIT:   OBJECTIVE:   DIAGNOSTIC FINDINGS: NA  PATIENT SURVEYS:  FOTO 47% function   COGNITION: Overall cognitive status: Within functional limits for tasks  assessed     SENSATION: WFL  EDEMA:  Minimal    LOWER EXTREMITY ROM:  Active ROM Right eval Left eval Right 06/10/22 Right 06/26/22  Hip flexion      Hip extension      Hip abduction      Hip adduction      Hip internal rotation      Hip external rotation      Knee flexion 92 115 105 113  Knee extension -5 0 -3 -3  Ankle dorsiflexion      Ankle plantarflexion      Ankle inversion      Ankle eversion       (Blank rows = not tested)  LOWER EXTREMITY MMT:  MMT Right eval Left eval  Hip flexion 4+ 5  Hip extension    Hip abduction    Hip adduction    Hip internal rotation    Hip external rotation    Knee flexion 4+ 5  Knee extension 4 5  Ankle dorsiflexion 5 5  Ankle plantarflexion    Ankle inversion    Ankle eversion     (Blank rows = not tested)   FUNCTIONAL TESTS:  2 minute walk test: 280 feet with RW   GAIT: Decreased stride, decreased hip and knee flexion of RT, using RW  TODAY'S TREATMENT:                                                                                                                              DATE:  07/01/22 Rec bike seat 16 x 5' dynamic warm up  Standing: Heel/toe raises 2 x 10 6" step ups 2 x 10 4" lateral step ups 2 x 10  Sit to stand no UE assist 2 x 10  Step navigation step over step over and back; has difficulty controlling descend on 8" step needs CGA for safety with descent  Supine: Quad set 5" x 10 Supine knee extension stretch x 1 Manual knee extension x 5 Manual knee flexion x 5  AROM -7 right knee extension; 116 right knee AROM flexion     06/26/22 Rec bike lv 2 5 min (seat 14)  Standing:  Rt knee flexion stretch with 8" box 10X10" 6 inch forward step up 2 x 10  with 1 UE 4 inch lateral step up with heel taps 2X10 with 1 UE Standing hip abduction GTB 2 x 10 Standing hip extension GTB 2 x 10 Tandem stance 2x30" on balance beam foam Vectors 10X5" with standing on Rt only 1 UE assist Sit to stand 2x10  no UE Supine:  AAROM, soft tissue mobilization, AROM 3-113 Rt knee  06/24/22 Rec bike lv 2 5 min (seat 15)  Slant board 3 x 30" Heelraise with incline x 20 Toeraise with incline x 20  6 inch forward step up 2 x 10  with 1 UE 4 inch lateral step up with heel taps 2X10 with 1 UE Standing hip abduction GTB 2 x 10 Standing hip extension GTB 2 x 10 Tandem stance 3 x 20" Vectors 10X5" with bil UE assist Sit to stand 2 x 10  06/19/22 Rec bike lv 2 5 min (seat 16)  Slant board 3 x 30" Knee drives 8 inch step 10 x 10"   HR x 20 TR x 20  Sit to stand 2 x 10 6 inch step up 2 x 10    Standing hip abduction RTB 2 x 10 Standing hip extension RTB 2 x 10 Tandem stance 3 x 20"  Supine heel slide AAROM with strap 10 x 10" Supine hamstring curl 3# x10   RT knee AROM -3 to 105 deg (achieved 112 deg AAROM)  06/17/22 Rec bike lv 2 5 min (seat 14)  Slant board 3 x 30" HR x 20 TR x 20  Sit to stand x 10 4 inch step up 2 x 10  Standing hip abduction 2 x 10 Tandem stance 3 x 20"  Clinic amb 150 feet with no AD    Manual PROM RT knee flexion/ extension   RT knee AROM (-3 to 107 deg)   PATIENT EDUCATION:  Education details: on eval findings, POC and HEP  Person educated: Patient Education method: Explanation Education comprehension: verbalized understanding  HOME EXERCISE PROGRAM: Access Code: [email or web address] URL: [email or web address]  06/19/22 - Supine Heel Slide with Strap  - 3 x daily - 7 x weekly - 2 sets - 10 reps - 5 second hold - Heel Raises with Counter Support  - 3 x daily - 7 x weekly - 2 sets - 10 reps - Sit to Stand with Counter Support  - 3 x daily - 7 x weekly - 1-2 sets - 10 reps - Standing Tandem Balance with Counter Support  - 3 x daily - 7 x weekly - 1 sets - 3 reps - 30 second hold - Standing Knee Flexion with Counter Support  - 3 x daily - 7 x weekly - 2 sets - 10 reps   Date: 06/05/2022 Prepared by: Josue Hector  Exercises - Supine Quad Set  - 3  x daily - 7 x weekly - 2 sets - 10 reps - 5 second hold - Supine Active Straight Leg Raise  - 3 x daily - 7 x weekly - 2 sets - 10 reps - Supine Heel Slide  - 3 x daily - 7 x weekly - 2 sets - 10 reps - 5 second hold - Supine Bridge  - 3 x daily - 7 x weekly - 2 sets - 10 reps  ASSESSMENT:  CLINICAL IMPRESSION: Today's session continued to focus on lower extremity strengthening and mobility. Trial of step navigation today;has difficulty controlling descend on 8" step needs CGA for safety with descent.  Discussed using blue extension stretch device at home a couple times a day to progress extension; good progress with knee flexion.   Patient will continue to benefit from skilled therapy services to reduce remaining deficits and improve functional ability.    OBJECTIVE IMPAIRMENTS: Abnormal gait, decreased activity tolerance, decreased mobility, difficulty walking, decreased ROM, decreased strength, hypomobility, increased edema, impaired flexibility, improper body mechanics, and pain.   ACTIVITY LIMITATIONS: carrying, lifting, bending, sitting, standing, squatting, sleeping, stairs, transfers, bed mobility, and locomotion level  PARTICIPATION LIMITATIONS: meal prep, cleaning, laundry, driving, shopping, community activity, occupation, and yard work  PERSONAL FACTORS:  NA  are also affecting patient's functional outcome.   REHAB POTENTIAL: Good  CLINICAL DECISION MAKING: Stable/uncomplicated  EVALUATION COMPLEXITY: Low   GOALS: SHORT TERM GOALS: Target date: 06/26/2022  Patient will be independent with initial HEP and self-management strategies to improve functional outcomes Baseline:  Goal status: IN PROGRESS   LONG TERM GOALS: Target date: 07/17/2022  Patient will be independent with advanced HEP and self-management strategies to improve functional outcomes Baseline:  Goal status: IN PROGRESS  2.  Patient will improve FOTO score to predicted value to indicate improvement in  functional outcomes Baseline: 47% function  Goal status: IN PROGRESS  3.  Patient will have RT knee AROM 0-120 degrees to improve functional mobility and facilitate squatting to pick up items from floor. Baseline: -5 to 92 deg Goal status: IN PROGRESS  4. Patient will have equal to or > 4+/5 MMT throughout BLE to improve ability to perform functional mobility, stair ambulation and ADLs.  Baseline: See MMT Goal status: IN PROGRESS  5. Patient will be able to ambulate at least 350 feet during 2MWT with LRAD to demonstrate improved ability to perform functional mobility and associated tasks. Baseline: 280 feet with RW Goal status: IN PROGRESS  PLAN:  PT FREQUENCY: 2x/week  PT DURATION: 6 weeks  PLANNED INTERVENTIONS: Therapeutic exercises, Therapeutic activity, Neuromuscular re-education, Balance training, Gait training, Patient/Family education, Joint manipulation, Joint mobilization, Stair training, Aquatic Therapy, Dry Needling, Electrical stimulation, Spinal manipulation, Spinal mobilization, Cryotherapy, Moist heat, scar mobilization, Taping, Traction, Ultrasound, Biofeedback, Ionotophoresis '4mg'$ /ml Dexamethasone, and Manual therapy.   PLAN FOR NEXT SESSION: Progress knee ROM, glute and quad strengthening as tolerated. Returns to [facility] sometime in March  12:00 PM, 07/01/22 Logen Heintzelman Small Carlos Quackenbush MPT [facility] [place] [phone]

## 2022-07-03 ENCOUNTER — Ambulatory Visit (HOSPITAL_COMMUNITY): Payer: Medicare Other | Admitting: Physical Therapy

## 2022-07-03 DIAGNOSIS — M25561 Pain in right knee: Secondary | ICD-10-CM

## 2022-07-03 DIAGNOSIS — R2689 Other abnormalities of gait and mobility: Secondary | ICD-10-CM

## 2022-07-03 DIAGNOSIS — M25661 Stiffness of right knee, not elsewhere classified: Secondary | ICD-10-CM

## 2022-07-03 NOTE — Therapy (Signed)
OUTPATIENT PHYSICAL THERAPY TREATMENT   Patient Name: Kyle Patel. MRN: [id number] DOB:Nov 05, 1945, 77 y.o., male Today's Date: 07/03/2022  END OF SESSION:  PT End of Session - 07/03/22 1427     Visit Number 9    Number of Visits 12    Date for PT Re-Evaluation 07/17/22    Authorization Type [employer]    Progress Note Due on Visit 10    PT Start Time 1118    PT Stop Time 1200    PT Time Calculation (min) 42 min    Activity Tolerance Patient tolerated treatment well    Behavior During Therapy WFL for tasks assessed/performed              Past Medical History:  Diagnosis Date   Acute anterior wall MI ([place]) 04/24/2017   Arthritis    AVN of femur ([place])    CAD (coronary artery disease)    a. s/p NSTEMI in 03/2017 with 100% distal LAD stenosis treated with POBA as stent could not be delivered   Chronic kidney disease    COPD (chronic obstructive pulmonary disease) ([place])    Essential hypertension    Hereditary and idiopathic peripheral neuropathy 05/2014   Hyperlipidemia    Pulmonary embolism ([place]) 10/2011   Past Surgical History:  Procedure Laterality Date   ABDOMINAL SURGERY     colon polyp removed 2007- sepsis, coma for 4 months per pt   BIOPSY  12/24/2020   Procedure: BIOPSY;  Surgeon: Eloise Harman, DO;  [facility];  Service: Endoscopy;;   CATARACT EXTRACTION W/PHACO Right 08/19/2021   Procedure: CATARACT EXTRACTION PHACO AND INTRAOCULAR LENS PLACEMENT ([place]);  Surgeon: Baruch Goldmann, MD;  Location: [facility];  Service: Ophthalmology;  Laterality: Right;  CDE: 7.02    CATARACT EXTRACTION W/PHACO Left 09/30/2021   Procedure: CATARACT EXTRACTION PHACO AND INTRAOCULAR LENS PLACEMENT ([facility]);  Surgeon: Baruch Goldmann, MD;  Location: [facility];  Service: Ophthalmology;  Laterality: Left;  CDE 4.61   COLONOSCOPY  03/22/2010   RMR: 1. Normal rectum 2. Diminutive polyp at the mouth of the ileocecal valve  status post cold biopsy removal. Remaideer of the colonic  mucosa and terminal ileum mucosa appeared normal.    COLONOSCOPY N/A 06/25/2015   Procedure: COLONOSCOPY;  Surgeon: Daneil Dolin, MD;  [facility];  Service: Endoscopy;  Laterality: N/A;  200-rescheduled 1/30 per [place]    COLONOSCOPY WITH PROPOFOL N/A 12/24/2020   Procedure: COLONOSCOPY WITH PROPOFOL;  Surgeon: Eloise Harman, DO;  [facility];  Service: Endoscopy;  Laterality: N/A;  ASA III / 8:00   CORONARY BALLOON ANGIOPLASTY N/A 04/24/2017   Procedure: CORONARY BALLOON ANGIOPLASTY;  Surgeon: Jettie Booze, MD;  Location: [facility];  Service: Cardiovascular;  Laterality: N/A;   LEFT HEART CATH AND CORONARY ANGIOGRAPHY N/A 04/24/2017   Procedure: LEFT HEART CATH AND CORONARY ANGIOGRAPHY;  Surgeon: Jettie Booze, MD;  Location: [facility];  Service: Cardiovascular;  Laterality: N/A;   LUMBAR LAMINECTOMY  06/02/2012   LUMBAR LAMINECTOMY/DECOMPRESSION MICRODISCECTOMY  06/02/2012   Procedure: LUMBAR LAMINECTOMY/DECOMPRESSION MICRODISCECTOMY 1 LEVEL;  Surgeon: Floyce Stakes, MD;  Location: [facility];  Service: Neurosurgery;  Laterality: Bilateral;  Bilateral Lumbar five-sacral one Foraminotomy and microdiskectomy    POLYPECTOMY  12/24/2020   Procedure: POLYPECTOMY;  Surgeon: Eloise Harman, DO;  [facility];  Service: Endoscopy;;   TOTAL KNEE ARTHROPLASTY Right 05/16/2022   Procedure: TOTAL KNEE ARTHROPLASTY;  Surgeon: Earlie Server, MD;  Location: [facility];  Service: Orthopedics;  Laterality: Right;   Patient Active Problem List   Diagnosis Date Noted   Type 2 diabetes mellitus with stage 3b chronic kidney disease, without long-term current use of insulin ([place]) 05/29/2022   Hospital discharge follow-up 05/29/2022   Hyperkalemia 05/19/2022   Acute respiratory failure with hypoxia ([place]) 05/18/2022   S/P total knee arthroplasty, right 05/18/2022   COPD with acute exacerbation ([place]) 05/18/2022   Primary localized  osteoarthritis of right knee 05/16/2022   Encounter for general adult medical examination with abnormal findings 05/12/2022   Preop examination 02/21/2022   Leg swelling 02/21/2022   CAD (coronary artery disease) 09/02/2021   Prediabetes 06/15/2020   Hyperlipidemia 04/27/2017   NSTEMI (non-ST elevated myocardial infarction) ([place]) 04/24/2017   History of colonic polyps    Diverticulosis of colon without hemorrhage    Family history of colon cancer 06/11/2015   Chronic foot pain 03/08/2015   Idiopathic peripheral neuropathy 05/31/2014   Lumbar radiculopathy 11/21/2013   Pain in joint, shoulder region 07/21/2012   Nocturnal muscle cramps 01/19/2012   Hypertension 11/10/2011   CKD (chronic kidney disease) 11/10/2011   Osteoarthritis of knee 09/16/2011    PCP: Ihor Dow MD  REFERRING PROVIDER: Earlie Server, MD  REFERRING DIAG: right total knee replacement  THERAPY DIAG:  No diagnosis found.  Rationale for Evaluation and Treatment: Rehabilitation  ONSET DATE: 05/16/22  SUBJECTIVE:   SUBJECTIVE STATEMENT: Patient comes into clinic today without carrying SPC (not using it).  State no pain just soreness. Pt states he used his blue foam as instructed as last visit.  PERTINENT HISTORY: RT TKA  05/16/22  PAIN:  Are you having pain? No  PRECAUTIONS: None  WEIGHT BEARING RESTRICTIONS: No  FALLS:  Has patient fallen in last 6 months? No  LIVING ENVIRONMENT: Lives with: lives with their family and lives with their spouse Lives in: House/apartment Stairs: Yes: External: 2 steps; none Has following equipment at home: Single point cane and Environmental consultant - 2 wheeled  OCCUPATION: Retired, part time driver   PLOF: Independent  PATIENT GOALS: To get better   NEXT MD VISIT:   OBJECTIVE:   DIAGNOSTIC FINDINGS: NA  PATIENT SURVEYS:  FOTO 47% function   COGNITION: Overall cognitive status: Within functional limits for tasks assessed     SENSATION: WFL  EDEMA:   Minimal    LOWER EXTREMITY ROM:  Active ROM Right eval Left eval Right 06/10/22 Right 06/26/22 Right 07/03/22  Hip flexion       Hip extension       Hip abduction       Hip adduction       Hip internal rotation       Hip external rotation       Knee flexion 92 115 105 113 115  Knee extension -5 0 -3 -3 -7  Ankle dorsiflexion       Ankle plantarflexion       Ankle inversion       Ankle eversion        (Blank rows = not tested)  LOWER EXTREMITY MMT:  MMT Right eval Left eval  Hip flexion 4+ 5  Hip extension    Hip abduction    Hip adduction    Hip internal rotation    Hip external rotation    Knee flexion 4+ 5  Knee extension 4 5  Ankle dorsiflexion 5 5  Ankle plantarflexion    Ankle inversion  Ankle eversion     (Blank rows = not tested)   FUNCTIONAL TESTS:  2 minute walk test: 280 feet with RW   GAIT: Decreased stride, decreased hip and knee flexion of RT, using RW  TODAY'S TREATMENT:                                                                                                                              DATE:  07/01/22 Rec bike seat 15 x 5' dynamic warm up Standing: Heel/toe raises 2 x 10 Rt 6" Forward step ups 2 x 10 Rt 4" lateral step ups 2 x 10 eccentric heel tap Stair negotiation Sit to stand no UE assist 2 x 10 Supine: Quad set 5" x 10 SAQ 10X5" Manual knee extension x 5 Manual knee flexion x 5 AROM -7 to 115   07/01/22 Rec bike seat 16 x 5' dynamic warm up  Standing: Heel/toe raises 2 x 10 6" step ups 2 x 10 4" lateral step ups 2 x 10  Sit to stand no UE assist 2 x 10  Step navigation step over step over and back; has difficulty controlling descend on 8" step needs CGA for safety with descent  Supine: Quad set 5" x 10 Supine knee extension stretch x 1 Manual knee extension x 5 Manual knee flexion x 5  AROM -7 right knee extension; 116 right knee AROM flexion     06/26/22 Rec bike lv 2 5 min (seat 14)  Standing:  Rt knee  flexion stretch with 8" box 10X10" 6 inch forward step up 2 x 10  with 1 UE 4 inch lateral step up with heel taps 2X10 with 1 UE Standing hip abduction GTB 2 x 10 Standing hip extension GTB 2 x 10 Tandem stance 2x30" on balance beam foam Vectors 10X5" with standing on Rt only 1 UE assist Sit to stand 2x10 no UE Supine:  AAROM, soft tissue mobilization, AROM 3-113 Rt knee  06/24/22 Rec bike lv 2 5 min (seat 15)  Slant board 3 x 30" Heelraise with incline x 20 Toeraise with incline x 20  6 inch forward step up 2 x 10  with 1 UE 4 inch lateral step up with heel taps 2X10 with 1 UE Standing hip abduction GTB 2 x 10 Standing hip extension GTB 2 x 10 Tandem stance 3 x 20" Vectors 10X5" with bil UE assist Sit to stand 2 x 10  06/19/22 Rec bike lv 2 5 min (seat 16)  Slant board 3 x 30" Knee drives 8 inch step 10 x 10"   HR x 20 TR x 20  Sit to stand 2 x 10 6 inch step up 2 x 10    Standing hip abduction RTB 2 x 10 Standing hip extension RTB 2 x 10 Tandem stance 3 x 20"  Supine heel slide AAROM with strap 10 x 10" Supine hamstring curl 3# x10   RT knee AROM -  3 to 105 deg (achieved 112 deg AAROM)  06/17/22 Rec bike lv 2 5 min (seat 14)  Slant board 3 x 30" HR x 20 TR x 20  Sit to stand x 10 4 inch step up 2 x 10  Standing hip abduction 2 x 10 Tandem stance 3 x 20"  Clinic amb 150 feet with no AD    Manual PROM RT knee flexion/ extension   RT knee AROM (-3 to 107 deg)   PATIENT EDUCATION:  Education details: on eval findings, POC and HEP  Person educated: Patient Education method: Explanation Education comprehension: verbalized understanding  HOME EXERCISE PROGRAM: Access Code: [email or web address] URL: [email or web address] 06/19/22 - Supine Heel Slide with Strap  - 3 x daily - 7 x weekly - 2 sets - 10 reps - 5 second hold - Heel Raises with Counter Support  - 3 x daily - 7 x weekly - 2 sets - 10 reps - Sit to Stand with Counter Support  - 3 x daily - 7 x  weekly - 1-2 sets - 10 reps - Standing Tandem Balance with Counter Support  - 3 x daily - 7 x weekly - 1 sets - 3 reps - 30 second hold - Standing Knee Flexion with Counter Support  - 3 x daily - 7 x weekly - 2 sets - 10 reps   Date: 06/05/2022 Prepared by: Josue Hector Exercises - Supine Quad Set  - 3 x daily - 7 x weekly - 2 sets - 10 reps - 5 second hold - Supine Active Straight Leg Raise  - 3 x daily - 7 x weekly - 2 sets - 10 reps - Supine Heel Slide  - 3 x daily - 7 x weekly - 2 sets - 10 reps - 5 second hold - Supine Bridge  - 3 x daily - 7 x weekly - 2 sets - 10 reps  ASSESSMENT:  CLINICAL IMPRESSION: Continued focus on lower extremity strengthening and mobility.  Began with bike with ability to move one level closer to increase ROM.  Worked on gait today without AD with cues to make larger, equal steps.  Continued to work on stair negotiation and eccentric phase during descent.  Pt has to use bil UE as he is unable to control effectively.   Encouraged to continue using blue foam for extension stretch at home a couple times a day to progress extension.  Instructed to work up to 15 minutes.  Patient will continue to benefit from skilled therapy services to reduce remaining deficits and improve functional ability.    OBJECTIVE IMPAIRMENTS: Abnormal gait, decreased activity tolerance, decreased mobility, difficulty walking, decreased ROM, decreased strength, hypomobility, increased edema, impaired flexibility, improper body mechanics, and pain.   ACTIVITY LIMITATIONS: carrying, lifting, bending, sitting, standing, squatting, sleeping, stairs, transfers, bed mobility, and locomotion level  PARTICIPATION LIMITATIONS: meal prep, cleaning, laundry, driving, shopping, community activity, occupation, and yard work  PERSONAL FACTORS:  NA  are also affecting patient's functional outcome.   REHAB POTENTIAL: Good  CLINICAL DECISION MAKING: Stable/uncomplicated  EVALUATION COMPLEXITY:  Low   GOALS: SHORT TERM GOALS: Target date: 06/26/2022  Patient will be independent with initial HEP and self-management strategies to improve functional outcomes Baseline:  Goal status: IN PROGRESS   LONG TERM GOALS: Target date: 07/17/2022  Patient will be independent with advanced HEP and self-management strategies to improve functional outcomes Baseline:  Goal status: IN PROGRESS  2.  Patient will improve FOTO score to predicted value to indicate  improvement in functional outcomes Baseline: 47% function  Goal status: IN PROGRESS  3.  Patient will have RT knee AROM 0-120 degrees to improve functional mobility and facilitate squatting to pick up items from floor. Baseline: -5 to 92 deg Goal status: IN PROGRESS  4. Patient will have equal to or > 4+/5 MMT throughout BLE to improve ability to perform functional mobility, stair ambulation and ADLs.  Baseline: See MMT Goal status: IN PROGRESS  5. Patient will be able to ambulate at least 350 feet during 2MWT with LRAD to demonstrate improved ability to perform functional mobility and associated tasks. Baseline: 280 feet with RW Goal status: IN PROGRESS  PLAN:  PT FREQUENCY: 2x/week  PT DURATION: 6 weeks  PLANNED INTERVENTIONS: Therapeutic exercises, Therapeutic activity, Neuromuscular re-education, Balance training, Gait training, Patient/Family education, Joint manipulation, Joint mobilization, Stair training, Aquatic Therapy, Dry Needling, Electrical stimulation, Spinal manipulation, Spinal mobilization, Cryotherapy, Moist heat, scar mobilization, Taping, Traction, Ultrasound, Biofeedback, Ionotophoresis '4mg'$ /ml Dexamethasone, and Manual therapy.   PLAN FOR NEXT SESSION: Progress knee ROM, glute and quad strengthening as tolerated. Returns to [place] sometime in March.   2:27 PM, 07/03/22 Teena Irani, PTA/CLT [place] Ph: [phone]  Teena Irani, PTA 07/03/2022, 2:27  PM

## 2022-07-07 ENCOUNTER — Other Ambulatory Visit: Payer: Self-pay | Admitting: Student

## 2022-07-07 ENCOUNTER — Ambulatory Visit (HOSPITAL_COMMUNITY): Payer: Medicare Other | Admitting: Physical Therapy

## 2022-07-07 DIAGNOSIS — M25661 Stiffness of right knee, not elsewhere classified: Secondary | ICD-10-CM

## 2022-07-07 DIAGNOSIS — M25561 Pain in right knee: Secondary | ICD-10-CM

## 2022-07-07 DIAGNOSIS — R2689 Other abnormalities of gait and mobility: Secondary | ICD-10-CM

## 2022-07-07 NOTE — Therapy (Addendum)
OUTPATIENT PHYSICAL THERAPY TREATMENT   Patient Name: Kyle Patel. MRN: [id number] DOB:[birth year], 77 y.o., male Today's Date: 07/07/2022  Progress Note Reporting Period 06/05/22 to 07/07/22  See note below for Objective Data and Assessment of Progress/Goals.     END OF SESSION:  PT End of Session - 07/07/22 1119     Visit Number 10    Number of Visits 18    Date for PT Re-Evaluation 08/08/22    Authorization Type [employer]    Progress Note Due on Visit 18    PT Start Time 1117    PT Stop Time 1157    PT Time Calculation (min) 40 min    Activity Tolerance Patient tolerated treatment well    Behavior During Therapy WFL for tasks assessed/performed              Past Medical History:  Diagnosis Date   Acute anterior wall MI ([place]) 04/24/2017   Arthritis    AVN of femur ([place])    CAD (coronary artery disease)    a. s/p NSTEMI in 03/2017 with 100% distal LAD stenosis treated with POBA as stent could not be delivered   Chronic kidney disease    COPD (chronic obstructive pulmonary disease) ([place])    Essential hypertension    Hereditary and idiopathic peripheral neuropathy 05/2014   Hyperlipidemia    Pulmonary embolism ([place]) 10/2011   Past Surgical History:  Procedure Laterality Date   ABDOMINAL SURGERY     colon polyp removed 2007- sepsis, coma for 4 months per pt   BIOPSY  12/24/2020   Procedure: BIOPSY;  Surgeon: Eloise Harman, DO;  [facility];  Service: Endoscopy;;   CATARACT EXTRACTION W/PHACO Right 08/19/2021   Procedure: CATARACT EXTRACTION PHACO AND INTRAOCULAR LENS PLACEMENT ([place]);  Surgeon: Baruch Goldmann, MD;  Location: [facility];  Service: Ophthalmology;  Laterality: Right;  CDE: 7.02    CATARACT EXTRACTION W/PHACO Left 09/30/2021   Procedure: CATARACT EXTRACTION PHACO AND INTRAOCULAR LENS PLACEMENT ([facility]);  Surgeon: Baruch Goldmann, MD;  Location: [facility];  Service: Ophthalmology;  Laterality: Left;  CDE 4.61   COLONOSCOPY  03/22/2010   RMR:  1. Normal rectum 2. Diminutive polyp at the mouth of the ileocecal valve  status post cold biopsy removal. Remaideer of the colonic mucosa and terminal ileum mucosa appeared normal.    COLONOSCOPY N/A 06/25/2015   Procedure: COLONOSCOPY;  Surgeon: Daneil Dolin, MD;  [facility];  Service: Endoscopy;  Laterality: N/A;  200-rescheduled 1/30 per [place]    COLONOSCOPY WITH PROPOFOL N/A 12/24/2020   Procedure: COLONOSCOPY WITH PROPOFOL;  Surgeon: Eloise Harman, DO;  [facility];  Service: Endoscopy;  Laterality: N/A;  ASA III / 8:00   CORONARY BALLOON ANGIOPLASTY N/A 04/24/2017   Procedure: CORONARY BALLOON ANGIOPLASTY;  Surgeon: Jettie Booze, MD;  Location: [facility];  Service: Cardiovascular;  Laterality: N/A;   LEFT HEART CATH AND CORONARY ANGIOGRAPHY N/A 04/24/2017   Procedure: LEFT HEART CATH AND CORONARY ANGIOGRAPHY;  Surgeon: Jettie Booze, MD;  Location: [facility];  Service: Cardiovascular;  Laterality: N/A;   LUMBAR LAMINECTOMY  06/02/2012   LUMBAR LAMINECTOMY/DECOMPRESSION MICRODISCECTOMY  06/02/2012   Procedure: LUMBAR LAMINECTOMY/DECOMPRESSION MICRODISCECTOMY 1 LEVEL;  Surgeon: Floyce Stakes, MD;  Location: [facility];  Service: Neurosurgery;  Laterality: Bilateral;  Bilateral Lumbar five-sacral one Foraminotomy and microdiskectomy    POLYPECTOMY  12/24/2020   Procedure: POLYPECTOMY;  Surgeon: Eloise Harman, DO;  [facility];  Service: Endoscopy;;   TOTAL KNEE ARTHROPLASTY Right 05/16/2022   Procedure: TOTAL KNEE ARTHROPLASTY;  Surgeon: Earlie Server, MD;  Location: [facility];  Service: Orthopedics;  Laterality: Right;   Patient Active Problem List   Diagnosis Date Noted   Type 2 diabetes mellitus with stage 3b chronic kidney disease, without long-term current use of insulin ([place]) 05/29/2022   Hospital discharge follow-up 05/29/2022   Hyperkalemia 05/19/2022   Acute respiratory failure with hypoxia ([place])  05/18/2022   S/P total knee arthroplasty, right 05/18/2022   COPD with acute exacerbation ([place]) 05/18/2022   Primary localized osteoarthritis of right knee 05/16/2022   Encounter for general adult medical examination with abnormal findings 05/12/2022   Preop examination 02/21/2022   Leg swelling 02/21/2022   CAD (coronary artery disease) 09/02/2021   Prediabetes 06/15/2020   Hyperlipidemia 04/27/2017   NSTEMI (non-ST elevated myocardial infarction) ([place]) 04/24/2017   History of colonic polyps    Diverticulosis of colon without hemorrhage    Family history of colon cancer 06/11/2015   Chronic foot pain 03/08/2015   Idiopathic peripheral neuropathy 05/31/2014   Lumbar radiculopathy 11/21/2013   Pain in joint, shoulder region 07/21/2012   Nocturnal muscle cramps 01/19/2012   Hypertension 11/10/2011   CKD (chronic kidney disease) 11/10/2011   Osteoarthritis of knee 09/16/2011    PCP: Ihor Dow MD  REFERRING PROVIDER: Earlie Server, MD  REFERRING DIAG: right total knee replacement  THERAPY DIAG:  Right knee pain, unspecified chronicity  Stiffness of right knee, not elsewhere classified  Other abnormalities of gait and mobility  Rationale for Evaluation and Treatment: Rehabilitation  ONSET DATE: 05/16/22  SUBJECTIVE:   SUBJECTIVE STATEMENT: Has good days and bad days. Still has a lot of stiffness. Pain depends on what he is doing. Up to a 5/10 sometimes. No pain right not just stiffness.   PERTINENT HISTORY: RT TKA  05/16/22  PAIN:  Are you having pain? No  PRECAUTIONS: None  WEIGHT BEARING RESTRICTIONS: No  FALLS:  Has patient fallen in last 6 months? No  LIVING ENVIRONMENT: Lives with: lives with their family and lives with their spouse Lives in: House/apartment Stairs: Yes: External: 2 steps; none Has following equipment at home: Single point cane and Environmental consultant - 2 wheeled  OCCUPATION: Retired, part time driver   PLOF: Independent  PATIENT GOALS: To  get better   NEXT MD VISIT:   OBJECTIVE:   DIAGNOSTIC FINDINGS: NA  PATIENT SURVEYS:  FOTO 55% (was 47% function)   COGNITION: Overall cognitive status: Within functional limits for tasks assessed     SENSATION: WFL  EDEMA:  Minimal    LOWER EXTREMITY ROM:  Active ROM Right eval Left eval Right 06/10/22 Right 06/26/22 Right 07/03/22 Right 07/07/22  Hip flexion        Hip extension        Hip abduction        Hip adduction        Hip internal rotation        Hip external rotation        Knee flexion 92 115 105 113 115 110  Knee extension -5 0 -3 -3 -7 -2 (after heel prop)  Ankle dorsiflexion        Ankle plantarflexion        Ankle inversion        Ankle eversion         (Blank rows = not tested)  LOWER EXTREMITY MMT:  MMT Right eval  Left eval Right  07/07/22  Hip flexion 4+ 5 5  Hip extension     Hip abduction     Hip adduction     Hip internal rotation     Hip external rotation     Knee flexion 4+ 5 5  Knee extension 4 5 4+  Ankle dorsiflexion 5 5 5  $ Ankle plantarflexion     Ankle inversion     Ankle eversion      (Blank rows = not tested)   FUNCTIONAL TESTS:  2 minute walk test: 280 feet with no AD (was 280 feet with RW)   GAIT: Decreased stride, decreased stance on RLE, no AD   TODAY'S TREATMENT:                                                                                                                              DATE:  07/07/22 Rec bike 5 min lv 3  FOTO AROM MMT 2 MWT  Supine AAROM heel slide 10 x 5"   07/01/22 Rec bike seat 15 x 5' dynamic warm up Standing: Heel/toe raises 2 x 10 Rt 6" Forward step ups 2 x 10 Rt 4" lateral step ups 2 x 10 eccentric heel tap Stair negotiation Sit to stand no UE assist 2 x 10 Supine: Quad set 5" x 10 SAQ 10X5" Manual knee extension x 5 Manual knee flexion x 5 AROM -7 to 115  07/01/22 Rec bike seat 16 x 5' dynamic warm up  Standing: Heel/toe raises 2 x 10 6" step ups 2 x 10 4" lateral  step ups 2 x 10  Sit to stand no UE assist 2 x 10  Step navigation step over step over and back; has difficulty controlling descend on 8" step needs CGA for safety with descent  Supine: Quad set 5" x 10 Supine knee extension stretch x 1 Manual knee extension x 5 Manual knee flexion x 5  AROM -7 right knee extension; 116 right knee AROM flexion    PATIENT EDUCATION:  Education details: on eval findings, POC and HEP  Person educated: Patient Education method: Explanation Education comprehension: verbalized understanding  HOME EXERCISE PROGRAM: Access Code: [email or web address] URL: [email or web address] 07/07/22 - Supine Heel Slide with Strap  - 5 x daily - 7 x weekly - 2 sets - 10 reps - 5 second hold - Supine Knee Extension Mobilization with Weight  - 5 x daily - 7 x weekly - 1 sets - 1 reps - 5 -10 min hold  06/19/22 - Supine Heel Slide with Strap  - 3 x daily - 7 x weekly - 2 sets - 10 reps - 5 second hold - Heel Raises with Counter Support  - 3 x daily - 7 x weekly - 2 sets - 10 reps - Sit to Stand with Counter Support  - 3 x daily - 7 x weekly - 1-2 sets - 10 reps - Standing Tandem Balance with Counter Support  -  3 x daily - 7 x weekly - 1 sets - 3 reps - 30 second hold - Standing Knee Flexion with Counter Support  - 3 x daily - 7 x weekly - 2 sets - 10 reps   Date: 06/05/2022 Prepared by: Josue Hector Exercises - Supine Quad Set  - 3 x daily - 7 x weekly - 2 sets - 10 reps - 5 second hold - Supine Active Straight Leg Raise  - 3 x daily - 7 x weekly - 2 sets - 10 reps - Supine Heel Slide  - 3 x daily - 7 x weekly - 2 sets - 10 reps - 5 second hold - Supine Bridge  - 3 x daily - 7 x weekly - 2 sets - 10 reps  ASSESSMENT:  CLINICAL IMPRESSION: Patient making slow but steady progress. Shows improved strength and is able to walk some without AD. Improved gait mechanics, but still limited by RLE ROM restrictions. Patient reports ongoing pain/ stiffness as well,  though activity tolerance is steadily increasing. Patient most limited by AROM restriction and ongoing functional weakness at this time. Encouraged increased participation in daily stretching for improving knee mobility. Patient will continue to benefit from skilled therapy services to address these remaining deficits for reduced knee pain and improved LOF with ADLs.   OBJECTIVE IMPAIRMENTS: Abnormal gait, decreased activity tolerance, decreased mobility, difficulty walking, decreased ROM, decreased strength, hypomobility, increased edema, impaired flexibility, improper body mechanics, and pain.   ACTIVITY LIMITATIONS: carrying, lifting, bending, sitting, standing, squatting, sleeping, stairs, transfers, bed mobility, and locomotion level  PARTICIPATION LIMITATIONS: meal prep, cleaning, laundry, driving, shopping, community activity, occupation, and yard work  PERSONAL FACTORS:  NA  are also affecting patient's functional outcome.   REHAB POTENTIAL: Good  CLINICAL DECISION MAKING: Stable/uncomplicated  EVALUATION COMPLEXITY: Low   GOALS: SHORT TERM GOALS: Target date: 06/26/2022  Patient will be independent with initial HEP and self-management strategies to improve functional outcomes Baseline:  Goal status: MET   LONG TERM GOALS: Target date: 07/17/2022  Patient will be independent with advanced HEP and self-management strategies to improve functional outcomes Baseline:  Goal status: IN PROGRESS  2.  Patient will improve FOTO score to predicted value to indicate improvement in functional outcomes Baseline: 55% function  Goal status: IN PROGRESS  3.  Patient will have RT knee AROM 0-120 degrees to improve functional mobility and facilitate squatting to pick up items from floor. Baseline: -2 to 110 deg Goal status: IN PROGRESS  4. Patient will have equal to or > 4+/5 MMT throughout BLE to improve ability to perform functional mobility, stair ambulation and ADLs.  Baseline: See  MMT Goal status: MET  5. Patient will be able to ambulate at least 350 feet during 2MWT with LRAD to demonstrate improved ability to perform functional mobility and associated tasks. Baseline: 280 feet with no AD  Goal status: IN PROGRESS  PLAN:  PT FREQUENCY: 2x/week  PT DURATION: 4 weeks  PLANNED INTERVENTIONS: Therapeutic exercises, Therapeutic activity, Neuromuscular re-education, Balance training, Gait training, Patient/Family education, Joint manipulation, Joint mobilization, Stair training, Aquatic Therapy, Dry Needling, Electrical stimulation, Spinal manipulation, Spinal mobilization, Cryotherapy, Moist heat, scar mobilization, Taping, Traction, Ultrasound, Biofeedback, Ionotophoresis 46m/ml Dexamethasone, and Manual therapy.   PLAN FOR NEXT SESSION: Progress knee ROM, glute and quad strengthening as tolerated. [place].    11:55 AM, 07/07/22 CJosue HectorPT DPT  Physical Therapist with [facility] ([phone]

## 2022-07-09 ENCOUNTER — Ambulatory Visit (HOSPITAL_COMMUNITY): Payer: Medicare Other | Admitting: Physical Therapy

## 2022-07-09 DIAGNOSIS — M25561 Pain in right knee: Secondary | ICD-10-CM

## 2022-07-09 DIAGNOSIS — M25661 Stiffness of right knee, not elsewhere classified: Secondary | ICD-10-CM

## 2022-07-09 NOTE — Therapy (Signed)
OUTPATIENT PHYSICAL THERAPY TREATMENT   Patient Name: Kyle Patel. MRN: [id number] DOB:11-19-1945, 77 y.o., male Today's Date: 07/09/2022   See note below for Objective Data and Assessment of Progress/Goals.     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 s/p NSTEMI in 03/2017 with 100% distal LAD stenosis treated with POBA as stent could not be delivered   Chronic kidney disease    COPD (chronic obstructive pulmonary disease) ([place])    Essential hypertension    Hereditary and idiopathic peripheral neuropathy 05/2014   Hyperlipidemia    Pulmonary embolism ([place]) 10/2011   Past Surgical History:  Procedure Laterality Date   ABDOMINAL SURGERY     colon polyp removed 2007- sepsis, coma for 4 months per pt   BIOPSY  12/24/2020   Procedure: BIOPSY;  Surgeon: Eloise Harman, DO;  [facility];  Service: Endoscopy;;   CATARACT EXTRACTION W/PHACO Right 08/19/2021   Procedure: CATARACT EXTRACTION PHACO AND INTRAOCULAR LENS PLACEMENT ([place]);  Surgeon: Baruch Goldmann, MD;  Location: [facility];  Service: Ophthalmology;  Laterality: Right;  CDE: 7.02    CATARACT EXTRACTION W/PHACO Left 09/30/2021   Procedure: CATARACT EXTRACTION PHACO AND INTRAOCULAR LENS PLACEMENT (I[facility]);  Surgeon: Baruch Goldmann, MD;  Location: [facility];  Service: Ophthalmology;  Laterality: Left;  CDE 4.61   COLONOSCOPY  03/22/2010   RMR: 1. Normal rectum 2. Diminutive polyp at the mouth of  the ileocecal valve  status post cold biopsy removal. Remaideer of the colonic mucosa and terminal ileum mucosa appeared normal.    COLONOSCOPY N/A 06/25/2015   Procedure: COLONOSCOPY;  Surgeon: Daneil Dolin, MD;  [facility];  Service: Endoscopy;  Laterality: N/A;  200-rescheduled 1/30 per [place]    COLONOSCOPY WITH PROPOFOL N/A 12/24/2020   Procedure: COLONOSCOPY WITH PROPOFOL;  Surgeon: Eloise Harman, DO;  [facility];  Service: Endoscopy;  Laterality: N/A;  ASA III / 8:00   CORONARY BALLOON ANGIOPLASTY N/A 04/24/2017   Procedure: CORONARY BALLOON ANGIOPLASTY;  Surgeon: Jettie Booze, MD;  Location: [place] [facility];  Service: Cardiovascular;  Laterality: N/A;   LEFT HEART CATH AND CORONARY ANGIOGRAPHY N/A 04/24/2017   Procedure: LEFT HEART CATH AND CORONARY ANGIOGRAPHY;  Surgeon: Jettie Booze, MD;  Location: [place] [facility];  Service: Cardiovascular;  Laterality: N/A;   LUMBAR LAMINECTOMY  06/02/2012   LUMBAR LAMINECTOMY/DECOMPRESSION MICRODISCECTOMY  06/02/2012   Procedure: LUMBAR LAMINECTOMY/DECOMPRESSION MICRODISCECTOMY 1 LEVEL;  Surgeon: Floyce Stakes, MD;  Location: [place] [facility];  Service: Neurosurgery;  Laterality: Bilateral;  Bilateral Lumbar five-sacral one Foraminotomy and microdiskectomy    POLYPECTOMY  12/24/2020   Procedure: POLYPECTOMY;  Surgeon: Eloise Harman, DO;  [facility];  Service:  Endoscopy;;   TOTAL KNEE ARTHROPLASTY Right 05/16/2022   Procedure: TOTAL KNEE ARTHROPLASTY;  Surgeon: Earlie Server, MD;  Location: [facility];  Service: Orthopedics;  Laterality: Right;   Patient Active Problem List   Diagnosis Date Noted   Type 2 diabetes mellitus with stage 3b chronic kidney disease, without long-term current use of insulin ([place]) 05/29/2022   Hospital discharge follow-up 05/29/2022   Hyperkalemia 05/19/2022   Acute respiratory failure with hypoxia ([place]) 05/18/2022   S/P total knee arthroplasty, right 05/18/2022    COPD with acute exacerbation ([place]) 05/18/2022   Primary localized osteoarthritis of right knee 05/16/2022   Encounter for general adult medical examination with abnormal findings 05/12/2022   Preop examination 02/21/2022   Leg swelling 02/21/2022   CAD (coronary artery disease) 09/02/2021   Prediabetes 06/15/2020   Hyperlipidemia 04/27/2017   NSTEMI (non-ST elevated myocardial infarction) ([place]) 04/24/2017   History of colonic polyps    Diverticulosis of colon without hemorrhage    Family history of colon cancer 06/11/2015   Chronic foot pain 03/08/2015   Idiopathic peripheral neuropathy 05/31/2014   Lumbar radiculopathy 11/21/2013   Pain in joint, shoulder region 07/21/2012   Nocturnal muscle cramps 01/19/2012   Hypertension 11/10/2011   CKD (chronic kidney disease) 11/10/2011   Osteoarthritis of knee 09/16/2011    PCP: Ihor Dow MD  REFERRING PROVIDER: Earlie Server, MD  REFERRING DIAG: right total knee replacement  THERAPY DIAG:  Right knee pain, unspecified chronicity  Stiffness of right knee, not elsewhere classified  Rationale for Evaluation and Treatment: Rehabilitation  ONSET DATE: 05/16/22  SUBJECTIVE:   SUBJECTIVE STATEMENT: Knee is stiff this morning. Has been doing stretches from HEP 4 x daily as previously discussed.   PERTINENT HISTORY: RT TKA  05/16/22  PAIN:  Are you having pain? No  PRECAUTIONS: None  WEIGHT BEARING RESTRICTIONS: No  FALLS:  Has patient fallen in last 6 months? No  LIVING ENVIRONMENT: Lives with: lives with their family and lives with their spouse Lives in: House/apartment Stairs: Yes: External: 2 steps; none Has following equipment at home: Single point cane and Environmental consultant - 2 wheeled  OCCUPATION: Retired, part time driver   PLOF: Independent  PATIENT GOALS: To get better   NEXT MD VISIT:   OBJECTIVE:   DIAGNOSTIC FINDINGS: NA  PATIENT SURVEYS:  FOTO 55% (was 47% function)   COGNITION: Overall cognitive  status: Within functional limits for tasks assessed     SENSATION: WFL  EDEMA:  Minimal    LOWER EXTREMITY ROM:  Active ROM Right eval Left eval Right 06/10/22 Right 06/26/22 Right 07/03/22 Right 07/07/22  Hip flexion        Hip extension        Hip abduction        Hip adduction        Hip internal rotation        Hip external rotation        Knee flexion 92 115 105 113 115 110  Knee extension -5 0 -3 -3 -7 -2 (after heel prop)  Ankle dorsiflexion        Ankle plantarflexion        Ankle inversion        Ankle eversion         (Blank rows = not tested)  LOWER EXTREMITY MMT:  MMT Right eval Left eval Right  07/07/22  Hip flexion 4+ 5 5  Hip extension     Hip abduction     Hip adduction  Hip internal rotation     Hip external rotation     Knee flexion 4+ 5 5  Knee extension 4 5 4+  Ankle dorsiflexion 5 5 5  $ Ankle plantarflexion     Ankle inversion     Ankle eversion      (Blank rows = not tested)   FUNCTIONAL TESTS:  2 minute walk test: 280 feet with no AD (was 280 feet with RW)   GAIT: Decreased stride, decreased stance on RLE, no AD   TODAY'S TREATMENT:                                                                                                                              DATE:  07/09/22 Rec bike 5 min lv 4 (seat 15)  Slant board 3 x 30"  HR x20 TR x20 Standing hip abduction 2lb x20 Standing hip extension 2lb x20 Standing knee flexion 2lb x20 Sit to stands 2 x 10  Step up 6 inch x 20 HHA x 1 Step down 6 inch x20 HHA x 1 TKE 3 plates x20  Tandem stance 2 x 30"  Manual RT knee flexion/ extension PROM RT knee AROM: -2 to 113 deg  07/07/22 Rec bike 5 min lv 3  FOTO AROM MMT 2 MWT  Supine AAROM heel slide 10 x 5"   07/01/22 Rec bike seat 15 x 5' dynamic warm up Standing: Heel/toe raises 2 x 10 Rt 6" Forward step ups 2 x 10 Rt 4" lateral step ups 2 x 10 eccentric heel tap Stair negotiation Sit to stand no UE assist 2 x 10 Supine:  Quad set 5" x 10 SAQ 10X5" Manual knee extension x 5 Manual knee flexion x 5 AROM -7 to 115   PATIENT EDUCATION:  Education details: on eval findings, POC and HEP  Person educated: Patient Education method: Explanation Education comprehension: verbalized understanding  HOME EXERCISE PROGRAM: Access Code: [email or web address] URL: [email or web address] 07/[email or web address]/22 - Supine Heel Slide with Strap  - 5 x daily - 7 x weekly - 2 sets - 10 reps - 5 second hold - Supine Knee Extension Mobilization with Weight  - 5 x daily - 7 x weekly - 1 sets - 1 reps - 5 -10 min hold  06/19/22 - Supine Heel Slide with Strap  - 3 x daily - 7 x weekly - 2 sets - 10 reps - 5 second hold - Heel Raises with Counter Support  - 3 x daily - 7 x weekly - 2 sets - 10 reps - Sit to Stand with Counter Support  - 3 x daily - 7 x weekly - 1-2 sets - 10 reps - Standing Tandem Balance with Counter Support  - 3 x daily - 7 x weekly - 1 sets - 3 reps - 30 second hold - Standing Knee Flexion with Counter Support  - 3 x daily - 7 x weekly - 2 sets - 10 reps   Date:  06/05/2022 Prepared by: Josue Hector Exercises - Supine Quad Set  - 3 x daily - 7 x weekly - 2 sets - 10 reps - 5 second hold - Supine Active Straight Leg Raise  - 3 x daily - 7 x weekly - 2 sets - 10 reps - Supine Heel Slide  - 3 x daily - 7 x weekly - 2 sets - 10 reps - 5 second hold - Supine Bridge  - 3 x daily - 7 x weekly - 2 sets - 10 reps  ASSESSMENT:  CLINICAL IMPRESSION: Patient tolerated session well today. Slight improvement seen in AROM. Continues to struggle with this. Mild Improvement noted following manual PROM. Progressed LE strengthening. Patient able to reduce HHA to single hand with steps. Patient will continue to benefit from skilled therapy services to reduce remaining deficits and improve functional ability.    OBJECTIVE IMPAIRMENTS: Abnormal gait, decreased activity tolerance, decreased mobility, difficulty walking, decreased ROM,  decreased strength, hypomobility, increased edema, impaired flexibility, improper body mechanics, and pain.   ACTIVITY LIMITATIONS: carrying, lifting, bending, sitting, standing, squatting, sleeping, stairs, transfers, bed mobility, and locomotion level  PARTICIPATION LIMITATIONS: meal prep, cleaning, laundry, driving, shopping, community activity, occupation, and yard work  PERSONAL FACTORS:  NA  are also affecting patient's functional outcome.   REHAB POTENTIAL: Good  CLINICAL DECISION MAKING: Stable/uncomplicated  EVALUATION COMPLEXITY: Low   GOALS: SHORT TERM GOALS: Target date: 06/26/2022  Patient will be independent with initial HEP and self-management strategies to improve functional outcomes Baseline:  Goal status: MET   LONG TERM GOALS: Target date: 07/17/2022  Patient will be independent with advanced HEP and self-management strategies to improve functional outcomes Baseline:  Goal status: IN PROGRESS  2.  Patient will improve FOTO score to predicted value to indicate improvement in functional outcomes Baseline: 55% function  Goal status: IN PROGRESS  3.  Patient will have RT knee AROM 0-120 degrees to improve functional mobility and facilitate squatting to pick up items from floor. Baseline: -2 to 110 deg Goal status: IN PROGRESS  4. Patient will have equal to or > 4+/5 MMT throughout BLE to improve ability to perform functional mobility, stair ambulation and ADLs.  Baseline: See MMT Goal status: MET  5. Patient will be able to ambulate at least 350 feet during 2MWT with LRAD to demonstrate improved ability to perform functional mobility and associated tasks. Baseline: 280 feet with no AD  Goal status: IN PROGRESS  PLAN:  PT FREQUENCY: 2x/week  PT DURATION: 4 weeks  PLANNED INTERVENTIONS: Therapeutic exercises, Therapeutic activity, Neuromuscular re-education, Balance training, Gait training, Patient/Family education, Joint manipulation, Joint mobilization,  Stair training, Aquatic Therapy, Dry Needling, Electrical stimulation, Spinal manipulation, Spinal mobilization, Cryotherapy, Moist heat, scar mobilization, Taping, Traction, Ultrasound, Biofeedback, Ionotophoresis 21m/ml Dexamethasone, and Manual therapy.   PLAN FOR NEXT SESSION: Progress knee ROM, glute and quad strengthening as tolerated. [place] [facility] [place].    9:47 AM, 07/09/22 CJosue HectorPT DPT  Physical Therapist with [facility] [phone]

## 2022-07-16 ENCOUNTER — Ambulatory Visit (HOSPITAL_COMMUNITY): Payer: Medicare Other | Admitting: Physical Therapy

## 2022-07-16 DIAGNOSIS — R2689 Other abnormalities of gait and mobility: Secondary | ICD-10-CM

## 2022-07-16 DIAGNOSIS — M25661 Stiffness of right knee, not elsewhere classified: Secondary | ICD-10-CM

## 2022-07-16 DIAGNOSIS — M25561 Pain in right knee: Secondary | ICD-10-CM | POA: Diagnosis not present

## 2022-07-16 NOTE — Therapy (Signed)
OUTPATIENT PHYSICAL THERAPY TREATMENT   Patient Name: Kyle Patel. 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 s/p NSTEMI in 03/2017 with 100% distal LAD stenosis treated with POBA as stent could not be delivered   Chronic kidney disease    COPD (chronic obstructive pulmonary disease) ([place])    Essential hypertension    Hereditary and idiopathic peripheral neuropathy 05/2014   Hyperlipidemia    Pulmonary embolism ([place]) 10/2011   Past Surgical History:  Procedure Laterality Date   ABDOMINAL SURGERY     colon polyp removed 2007- sepsis, coma for 4 months per pt   BIOPSY  12/24/2020   Procedure: BIOPSY;  Surgeon: Eloise Harman, DO;  [facility];  Service: Endoscopy;;   CATARACT EXTRACTION W/PHACO Right 08/19/2021   Procedure: CATARACT EXTRACTION PHACO AND INTRAOCULAR LENS PLACEMENT ([place]);  Surgeon: Baruch Goldmann, MD;  Location: [facility];  Service: Ophthalmology;  Laterality: Right;  CDE: 7.02    CATARACT EXTRACTION W/PHACO Left 09/30/2021   Procedure: CATARACT EXTRACTION PHACO AND INTRAOCULAR LENS PLACEMENT ([facility]);  Surgeon: Baruch Goldmann, MD;  Location: [facility];  Service: Ophthalmology;  Laterality: Left;  CDE 4.61   COLONOSCOPY  03/22/2010   RMR: 1. Normal rectum 2. Diminutive polyp at the mouth of the ileocecal valve  status post cold biopsy removal. Remaideer of the  colonic mucosa and terminal ileum mucosa appeared normal.    COLONOSCOPY N/A 06/25/2015   Procedure: COLONOSCOPY;  Surgeon: Daneil Dolin, MD;  [facility];  Service: Endoscopy;  Laterality: N/A;  200-rescheduled 1/30 per [place]    COLONOSCOPY WITH PROPOFOL N/A 12/24/2020   Procedure: COLONOSCOPY WITH PROPOFOL;  Surgeon: Eloise Harman, DO;  [facility];  Service: Endoscopy;  Laterality: N/A;  ASA III / 8:00   CORONARY BALLOON ANGIOPLASTY N/A 04/24/2017   Procedure: CORONARY BALLOON ANGIOPLASTY;  Surgeon: Jettie Booze, MD;  Location: [facility] [facility];  Service: Cardiovascular;  Laterality: N/A;   LEFT HEART CATH AND CORONARY ANGIOGRAPHY N/A 04/24/2017   Procedure: LEFT HEART CATH AND CORONARY ANGIOGRAPHY;  Surgeon: Jettie Booze, MD;  Location: [facility];  Service: Cardiovascular;  Laterality: N/A;   LUMBAR LAMINECTOMY  06/02/2012   LUMBAR LAMINECTOMY/DECOMPRESSION MICRODISCECTOMY  06/02/2012   Procedure: LUMBAR LAMINECTOMY/DECOMPRESSION MICRODISCECTOMY 1 LEVEL;  Surgeon: Floyce Stakes, MD;  Location: [facility];  Service: Neurosurgery;  Laterality: Bilateral;  Bilateral Lumbar five-sacral one Foraminotomy and microdiskectomy    POLYPECTOMY  12/24/2020   Procedure: POLYPECTOMY;  Surgeon: Eloise Harman, DO;  [facility];  Service: Endoscopy;;   TOTAL KNEE ARTHROPLASTY Right 05/16/2022   Procedure:  TOTAL KNEE ARTHROPLASTY;  Surgeon: Earlie Server, MD;  Location: [facility];  Service: Orthopedics;  Laterality: Right;   Patient Active Problem List   Diagnosis Date Noted   Type 2 diabetes mellitus with stage 3b chronic kidney disease, without long-term current use of insulin ([place]) 05/29/2022   Hospital discharge follow-up 05/29/2022   Hyperkalemia 05/19/2022   Acute respiratory failure with hypoxia ([place]) 05/18/2022   S/P total knee arthroplasty, right 05/18/2022   COPD with acute exacerbation ([place]) 05/18/2022   Primary localized  osteoarthritis of right knee 05/16/2022   Encounter for general adult medical examination with abnormal findings 05/12/2022   Preop examination 02/21/2022   Leg swelling 02/21/2022   CAD (coronary artery disease) 09/02/2021   Prediabetes 06/15/2020   Hyperlipidemia 04/27/2017   NSTEMI (non-ST elevated myocardial infarction) ([place]) 04/24/2017   History of colonic polyps    Diverticulosis of colon without hemorrhage    Family history of colon cancer 06/11/2015   Chronic foot pain 03/08/2015   Idiopathic peripheral neuropathy 05/31/2014   Lumbar radiculopathy 11/21/2013   Pain in joint, shoulder region 07/21/2012   Nocturnal muscle cramps 01/19/2012   Hypertension 11/10/2011   CKD (chronic kidney disease) 11/10/2011   Osteoarthritis of knee 09/16/2011    PCP: Ihor Dow MD  REFERRING PROVIDER: Earlie Server, MD  REFERRING DIAG: right total knee replacement  THERAPY DIAG:  Right knee pain, unspecified chronicity  Stiffness of right knee, not elsewhere classified  Other abnormalities of gait and mobility  Rationale for Evaluation and Treatment: Rehabilitation  ONSET DATE: 05/16/22  SUBJECTIVE:   SUBJECTIVE STATEMENT: Pt states his Knee is stiff in the morning.   Continues to use SPC with ambulation.    PERTINENT HISTORY: RT TKA  05/16/22  PAIN:  Are you having pain? No  PRECAUTIONS: None  WEIGHT BEARING RESTRICTIONS: No  FALLS:  Has patient fallen in last 6 months? No  LIVING ENVIRONMENT: Lives with: lives with their family and lives with their spouse Lives in: House/apartment Stairs: Yes: External: 2 steps; none Has following equipment at home: Single point cane and Environmental consultant - 2 wheeled  OCCUPATION: Retired, part time driver   PLOF: Independent  PATIENT GOALS: To get better   NEXT MD VISIT:   OBJECTIVE:   DIAGNOSTIC FINDINGS: NA  PATIENT SURVEYS:  FOTO 55% (was 47% function)   COGNITION: Overall cognitive status: Within functional limits for  tasks assessed     SENSATION: WFL  EDEMA:  Minimal    LOWER EXTREMITY ROM:  Active ROM Right eval Left eval Right 06/10/22 Right 06/26/22 Right 07/03/22 Right 07/07/22  Hip flexion        Hip extension        Hip abduction        Hip adduction        Hip internal rotation        Hip external rotation        Knee flexion 92 115 105 113 115 110  Knee extension -5 0 -3 -3 -7 -2 (after heel prop)  Ankle dorsiflexion        Ankle plantarflexion        Ankle inversion        Ankle eversion         (Blank rows = not tested)  LOWER EXTREMITY MMT:  MMT Right eval Left eval Right  07/07/22  Hip flexion 4+ 5 5  Hip extension     Hip abduction     Hip adduction  Hip internal rotation     Hip external rotation     Knee flexion 4+ 5 5  Knee extension 4 5 4+  Ankle dorsiflexion 5 5 5  $ Ankle plantarflexion     Ankle inversion     Ankle eversion      (Blank rows = not tested)   FUNCTIONAL TESTS:  2 minute walk test: 280 feet with no AD (was 280 feet with RW)   GAIT: Decreased stride, decreased stance on RLE, no AD   TODAY'S TREATMENT:                                                                                                                              DATE:  07/09/22 Rec bike 5 min lv 4 (seat 15) Standing: Slant board 3 x 30"   Knee flexion stretch 12" 10X 10" holds Standing hip abduction 2lb x20 Standing hip extension 2lb x20 Standing knee flexion 2lb x20 Step up 6 inch x 20 HHA x 1 Step down lateral 6 inch x20 HHA x 1 TKE 3 plates x20  Tandem stance 2 x 30" Sit to stands 2 x 10  Manual RT knee flexion/ extension PROM RT knee AROM: -5 to 112 deg  07/09/22 Rec bike 5 min lv 4 (seat 15)  Slant board 3 x 30"  HR x20 TR x20 Standing hip abduction 2lb x20 Standing hip extension 2lb x20 Standing knee flexion 2lb x20 Sit to stands 2 x 10  Step up 6 inch x 20 HHA x 1 Step down 6 inch x20 HHA x 1 TKE 3 plates x20  Tandem stance 2 x 30"  Manual RT  knee flexion/ extension PROM RT knee AROM: -2 to 113 deg  07/07/22 Rec bike 5 min lv 3  FOTO AROM MMT 2 MWT  Supine AAROM heel slide 10 x 5"   07/01/22 Rec bike seat 15 x 5' dynamic warm up Standing: Heel/toe raises 2 x 10 Rt 6" Forward step ups 2 x 10 Rt 4" lateral step ups 2 x 10 eccentric heel tap Stair negotiation Sit to stand no UE assist 2 x 10 Supine: Quad set 5" x 10 SAQ 10X5" Manual knee extension x 5 Manual knee flexion x 5 AROM -7 to 115   PATIENT EDUCATION:  Education details: on eval findings, POC and HEP  Person educated: Patient Education method: Explanation Education comprehension: verbalized understanding  HOME EXERCISE PROGRAM: Access Code: [email or web address] URL: [email or web address] 07/07/22 - Supine Heel Slide with Strap  - 5 x daily - 7 x weekly - 2 sets - 10 reps - 5 second hold - Supine Knee Extension Mobilization with Weight  - 5 x daily - 7 x weekly - 1 sets - 1 reps - 5 -10 min hold  06/19/22 - Supine Heel Slide with Strap  - 3 x daily - 7 x weekly - 2 sets - 10 reps - 5 second hold - Heel Raises with Counter Support  -  3 x daily - 7 x weekly - 2 sets - 10 reps - Sit to Stand with Counter Support  - 3 x daily - 7 x weekly - 1-2 sets - 10 reps - Standing Tandem Balance with Counter Support  - 3 x daily - 7 x weekly - 1 sets - 3 reps - 30 second hold - Standing Knee Flexion with Counter Support  - 3 x daily - 7 x weekly - 2 sets - 10 reps   Date: 06/05/2022 Prepared by: Josue Hector Exercises - Supine Quad Set  - 3 x daily - 7 x weekly - 2 sets - 10 reps - 5 second hold - Supine Active Straight Leg Raise  - 3 x daily - 7 x weekly - 2 sets - 10 reps - Supine Heel Slide  - 3 x daily - 7 x weekly - 2 sets - 10 reps - 5 second hold - Supine Bridge  - 3 x daily - 7 x weekly - 2 sets - 10 reps  ASSESSMENT:  CLINICAL IMPRESSION: Continued focus on improving functional strength and AROM of Rt knee.  Slow and steady gains in ROM  measurements. Increased difficulty of tandem stance with foam surface.  Noted challenge but able to maintain full 30 seconds with intermittent hand tap on bar.  No pain or issues with therex completed this session.  Patient will continue to benefit from skilled therapy services to reduce remaining deficits and improve functional ability.    OBJECTIVE IMPAIRMENTS: Abnormal gait, decreased activity tolerance, decreased mobility, difficulty walking, decreased ROM, decreased strength, hypomobility, increased edema, impaired flexibility, improper body mechanics, and pain.   ACTIVITY LIMITATIONS: carrying, lifting, bending, sitting, standing, squatting, sleeping, stairs, transfers, bed mobility, and locomotion level  PARTICIPATION LIMITATIONS: meal prep, cleaning, laundry, driving, shopping, community activity, occupation, and yard work  PERSONAL FACTORS:  NA  are also affecting patient's functional outcome.   REHAB POTENTIAL: Good  CLINICAL DECISION MAKING: Stable/uncomplicated  EVALUATION COMPLEXITY: Low   GOALS: SHORT TERM GOALS: Target date: 06/26/2022  Patient will be independent with initial HEP and self-management strategies to improve functional outcomes Baseline:  Goal status: MET   LONG TERM GOALS: Target date: 07/17/2022  Patient will be independent with advanced HEP and self-management strategies to improve functional outcomes Baseline:  Goal status: IN PROGRESS  2.  Patient will improve FOTO score to predicted value to indicate improvement in functional outcomes Baseline: 55% function  Goal status: IN PROGRESS  3.  Patient will have RT knee AROM 0-120 degrees to improve functional mobility and facilitate squatting to pick up items from floor. Baseline: -2 to 110 deg Goal status: IN PROGRESS  4. Patient will have equal to or > 4+/5 MMT throughout BLE to improve ability to perform functional mobility, stair ambulation and ADLs.  Baseline: See MMT Goal status: MET  5.  Patient will be able to ambulate at least 350 feet during 2MWT with LRAD to demonstrate improved ability to perform functional mobility and associated tasks. Baseline: 280 feet with no AD  Goal status: IN PROGRESS  PLAN:  PT FREQUENCY: 2x/week  PT DURATION: 4 weeks  PLANNED INTERVENTIONS: Therapeutic exercises, Therapeutic activity, Neuromuscular re-education, Balance training, Gait training, Patient/Family education, Joint manipulation, Joint mobilization, Stair training, Aquatic Therapy, Dry Needling, Electrical stimulation, Spinal manipulation, Spinal mobilization, Cryotherapy, Moist heat, scar mobilization, Taping, Traction, Ultrasound, Biofeedback, Ionotophoresis 54m/ml Dexamethasone, and Manual therapy.   PLAN FOR NEXT SESSION: Progress knee ROM, glute and quad strengthening as tolerated. Returns  to [place] sometime in March.    5:23 PM, 07/16/22 Teena Irani, PTA/CLT [place] Ph: [phone]

## 2022-07-16 NOTE — Progress Notes (Unsigned)
Cardiology Office Note  Date: 07/17/2022   ID: Kyle Patel., DOB Sep 02, 1945, MRN [id number]  PCP:  Kyle Spar, MD  Cardiologist:  Kyle Lesches, MD Electrophysiologist:  None   Chief Complaint  Patient presents with   Cardiac follow-up    History of Present Illness: Kyle Patel. is a 77 y.o. male last assessed via telehealth encounter by Ms. Swinyer NP in November 2023, I reviewed the note.  Records indicate right total knee arthroplasty in November 2023.  He had a subsequent hospital stay in December 2023 with hypoxic respiratory failure associated with bronchitis and atelectasis, no evidence of pulmonary embolus by chest CTA.  He is here today for a routine visit, reports no chest pain or nitroglycerin use.  We discussed getting a refill for a fresh bottle.  He reports compliance with his medications otherwise, also following with nephrology with CKD stage IIIb and hyperkalemia.  Last ischemic testing was in 2022, low risk Myoview as noted below.  I did review his ECG from December 2023.  He has not had a recent lipid profile.  Past Medical History:  Diagnosis Date   Acute anterior wall MI ([place]) 04/24/2017   Arthritis    AVN of femur ([place])    CAD (coronary artery disease)    a. 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   Hyperlipidemia    Pulmonary embolism ([place]) 10/2011    Current Outpatient Medications  Medication Sig Dispense Refill   amLODipine (NORVASC) 10 MG tablet Take 1 tablet (10 mg total) by mouth daily. 90 tablet 3   aspirin 81 MG chewable tablet Chew 81 mg by mouth 2 (two) times daily.     calcitRIOL (ROCALTROL) 0.25 MCG capsule Take 0.25 mcg by mouth every Monday, Wednesday, and Friday.     Coenzyme Q10-Vitamin E  100-150 MG-UNIT CAPS Take 1 capsule by mouth at bedtime.     COLACE 100 MG capsule SMARTSIG:1-3 Capsule(s) By Mouth PRN     dapagliflozin propanediol (FARXIGA) 5 MG TABS tablet Take 5 mg by mouth See admin instructions. Take 5 mg by mouth on Monday's and Thursday's.     ezetimibe (ZETIA) 10 MG tablet TAKE (1) TABLET BY MOUTH ONCE DAILY. 90 tablet 1   furosemide (LASIX) 20 MG tablet TAKE 1 TABLET BY MOUTH ONCE DAILY. 90 tablet 2   lisinopril (ZESTRIL) 10 MG tablet Take 1 tablet (10 mg total) by mouth daily.     metoprolol succinate (TOPROL-XL) 50 MG 24 hr tablet TAKE (1) TABLET BY MOUTH ONCE DAILY. TAKE WITH OR IMMEDIATELY FOLLOWING A MEAL. 90 tablet 2   Misc Natural Products (OSTEO BI-FLEX ADV JOINT SHIELD) TABS Take 1 tablet by mouth daily.     Multiple Vitamins-Minerals (CENTRUM PO) Take 1 tablet by mouth every morning.     oxyCODONE (OXY IR/ROXICODONE) 5 MG immediate release tablet Take 5 mg by mouth 4 (four) times daily as needed.     pregabalin (LYRICA) 200 MG capsule Take 1 capsule (200 mg total) by mouth 2 (two) times daily. 60 capsule 5   rosuvastatin (CRESTOR) 10 MG tablet TAKE (1) TABLET BY MOUTH ONCE DAILY. (Patient taking differently: Take 10 mg by mouth daily.) 90 tablet 1   sodium bicarbonate 650 MG tablet Take  650 mg by mouth 3 (three) times daily.     nitroGLYCERIN (NITROSTAT) 0.4 MG SL tablet PLACE 1 TAB UNDER TONGUE EVERY 5 MIN IF NEEDED FOR CHEST PAIN. MAY USE 3 TIMES.NO RELIEF CALL 911. 25 tablet 3   pantoprazole (PROTONIX) 40 MG tablet Take 1 tablet (40 mg total) by mouth daily. 30 tablet 0   No current facility-administered medications for this visit.   Allergies:  Levaquin [levofloxacin] and Statins   ROS: No palpitations or syncope.  Physical Exam: VS:  BP (!) 140/70   Pulse 72   Ht 5' 9.5" (1.765 m)   Wt 205 lb 6.4 oz (93.2 kg)   SpO2 92%   BMI 29.90 kg/m , BMI Body mass index is 29.9 kg/m.  Wt Readings from Last 3 Encounters:  07/17/22 205 lb 6.4 oz (93.2  kg)  05/29/22 199 lb 6.4 oz (90.4 kg)  05/18/22 203 lb (92.1 kg)    General: Patient appears comfortable at rest. HEENT: Conjunctiva and lids normal. Neck: Supple, no elevated JVP or carotid bruits. Lungs: Clear to auscultation, nonlabored breathing at rest. Cardiac: Regular rate and rhythm, no S3 or significant systolic murmur. Extremities: No pitting edema.  ECG:  An ECG dated 05/18/2022 was personally reviewed today and demonstrated:  Minus rhythm with PAC, right bundle branch block, old anterior infarct pattern.  Recent Labwork: 05/18/2022: ALT 12; AST 30; B Natriuretic Peptide 696.0; Magnesium 2.2 05/19/2022: BUN 40; Creatinine, Ser 1.98; Hemoglobin 12.6; Platelets 145; Potassium 5.4; Sodium 137     Component Value Date/Time   CHOL 238 (H) 07/04/2019 1501   TRIG 124 07/04/2019 1501   HDL 53 07/04/2019 1501   CHOLHDL 4.5 07/04/2019 1501   CHOLHDL 3.1 05/14/2018 0924   VLDL 15 05/14/2018 0924   LDLCALC 163 (H) 07/04/2019 1501  June 2022: Cholesterol 183, triglycerides 91, HDL 61, LDL 105  Other Studies Reviewed Today:  Lexiscan Myoview 08/03/2020: No diagnostic ST segment changes to indicate ischemia. Small, moderate intensity, apical and inferior apical defect that is fixed and most consistent with scar. There is also diaphragmatic attenuation noted. This is a low risk study. Nuclear stress EF: 58%.  Assessment and Plan:  1.  CAD status post distal LAD angioplasty in 2018.  He reports no recurring angina and underwent a low risk Myoview in 2022.  Recent ECG reviewed.  Plan to refill a fresh bottle of nitroglycerin.  Continue aspirin, Farxiga, Toprol-XL, Crestor, and Zetia.  2.  Mixed hyperlipidemia on Crestor and Zetia.  Check fasting lipid profile.  3.  CKD stage IIIb, continue to follow with Dr. Theador Hawthorne.  I reviewed his most recent lab work, creatinine 1.98.  4.  Essential hypertension, currently on Norvasc, Toprol-XL, and lisinopril.  Medication Adjustments/Labs and  Tests Ordered: Current medicines are reviewed at length with the patient today.  Concerns regarding medicines are outlined above.   Tests Ordered: Orders Placed This Encounter  Procedures   Lipid Profile    Medication Changes: Meds ordered this encounter  Medications   nitroGLYCERIN (NITROSTAT) 0.4 MG SL tablet    Sig: PLACE 1 TAB UNDER TONGUE EVERY 5 MIN IF NEEDED FOR CHEST PAIN. MAY USE 3 TIMES.NO RELIEF CALL 911.    Dispense:  25 tablet    Refill:  3    Disposition:  Follow up  6 months.  Signed, Satira Sark, MD, [facility] 07/17/2022 10:02 AM    [facility] [address] [place], [place] [address] Phone: [phone])  [id number]; Fax: [phone]

## 2022-07-17 ENCOUNTER — Ambulatory Visit: Payer: Medicare Other | Attending: Cardiology | Admitting: Cardiology

## 2022-07-17 ENCOUNTER — Encounter (HOSPITAL_COMMUNITY): Payer: Self-pay | Admitting: Physical Therapy

## 2022-07-17 ENCOUNTER — Encounter: Payer: Self-pay | Admitting: Cardiology

## 2022-07-17 ENCOUNTER — Ambulatory Visit (HOSPITAL_COMMUNITY): Payer: Medicare Other | Admitting: Physical Therapy

## 2022-07-17 VITALS — BP 140/70 | HR 72 | Ht 69.5 in | Wt 205.4 lb

## 2022-07-17 DIAGNOSIS — E782 Mixed hyperlipidemia: Secondary | ICD-10-CM

## 2022-07-17 DIAGNOSIS — M25561 Pain in right knee: Secondary | ICD-10-CM

## 2022-07-17 DIAGNOSIS — I25119 Atherosclerotic heart disease of native coronary artery with unspecified angina pectoris: Secondary | ICD-10-CM

## 2022-07-17 DIAGNOSIS — N1832 Chronic kidney disease, stage 3b: Secondary | ICD-10-CM

## 2022-07-17 DIAGNOSIS — I10 Essential (primary) hypertension: Secondary | ICD-10-CM

## 2022-07-17 MED ORDER — NITROGLYCERIN 0.4 MG SL SUBL: SUBLINGUAL_TABLET | SUBLINGUAL | 3 refills | Status: DC

## 2022-07-17 NOTE — Therapy (Signed)
OUTPATIENT PHYSICAL THERAPY TREATMENT   Patient Name: Kyle Patel. 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 s/p NSTEMI in 03/2017 with 100% distal LAD stenosis treated with POBA as stent could not be delivered   Chronic kidney disease    COPD (chronic obstructive pulmonary disease) ([place])    Essential hypertension    Hereditary and idiopathic peripheral neuropathy 05/2014   Hyperlipidemia    Pulmonary embolism ([place]) 10/2011   Past Surgical History:  Procedure Laterality Date   ABDOMINAL SURGERY     colon polyp removed 2007- sepsis, coma for 4 months per pt   BIOPSY  12/24/2020   Procedure: BIOPSY;  Surgeon: Eloise Harman, DO;  [facility];  Service: Endoscopy;;   CATARACT EXTRACTION W/PHACO Right 08/19/2021   Procedure: CATARACT EXTRACTION PHACO AND INTRAOCULAR LENS PLACEMENT ([place]);  Surgeon: Baruch Goldmann, MD;  Location: [facility];  Service: Ophthalmology;  Laterality: Right;  CDE: 7.02    CATARACT EXTRACTION W/PHACO Left 09/30/2021   Procedure: CATARACT EXTRACTION PHACO AND INTRAOCULAR LENS PLACEMENT ([facility]);  Surgeon: Baruch Goldmann, MD;  Location: [facility];  Service: Ophthalmology;  Laterality: Left;  CDE 4.61   COLONOSCOPY  03/22/2010   RMR: 1. Normal rectum 2. Diminutive polyp at the mouth of the ileocecal valve  status post cold biopsy removal. Remaideer of the  colonic mucosa and terminal ileum mucosa appeared normal.    COLONOSCOPY N/A 06/25/2015   Procedure: COLONOSCOPY;  Surgeon: Daneil Dolin, MD;  [facility];  Service: Endoscopy;  Laterality: N/A;  200-rescheduled 1/30 per [place]    COLONOSCOPY WITH PROPOFOL N/A 12/24/2020   Procedure: COLONOSCOPY WITH PROPOFOL;  Surgeon: Eloise Harman, DO;  [facility];  Service: Endoscopy;  Laterality: N/A;  ASA III / 8:00   CORONARY BALLOON ANGIOPLASTY N/A 04/24/2017   Procedure: CORONARY BALLOON ANGIOPLASTY;  Surgeon: Jettie Booze, MD;  Location: [facility];  Service: Cardiovascular;  Laterality: N/A;   LEFT HEART CATH AND CORONARY ANGIOGRAPHY N/A 04/24/2017   Procedure: LEFT HEART CATH AND CORONARY ANGIOGRAPHY;  Surgeon: Jettie Booze, MD;  Location: [place] [facility];  Service: Cardiovascular;  Laterality: N/A;   LUMBAR LAMINECTOMY  06/02/2012   LUMBAR LAMINECTOMY/DECOMPRESSION MICRODISCECTOMY  06/02/2012   Procedure: LUMBAR LAMINECTOMY/DECOMPRESSION MICRODISCECTOMY 1 LEVEL;  Surgeon: Floyce Stakes, MD;  Location: [facility];  Service: Neurosurgery;  Laterality: Bilateral;  Bilateral Lumbar five-sacral one Foraminotomy and microdiskectomy    POLYPECTOMY  12/24/2020   Procedure: POLYPECTOMY;  Surgeon: Eloise Harman, DO;  [facility];  Service: Endoscopy;;   TOTAL KNEE ARTHROPLASTY Right 05/16/2022   Procedure:  TOTAL KNEE ARTHROPLASTY;  Surgeon: Earlie Server, MD;  Location: [facility];  Service: Orthopedics;  Laterality: Right;   Patient Active Problem List   Diagnosis Date Noted   Type 2 diabetes mellitus with stage 3b chronic kidney disease, without long-term current use of insulin ([place]) 05/29/2022   Hospital discharge follow-up 05/29/2022   Hyperkalemia 05/19/2022   Acute respiratory failure with hypoxia ([place]) 05/18/2022   S/P total knee arthroplasty, right 05/18/2022   COPD with acute exacerbation ([place]) 05/18/2022   Primary localized  osteoarthritis of right knee 05/16/2022   Encounter for general adult medical examination with abnormal findings 05/12/2022   Preop examination 02/21/2022   Leg swelling 02/21/2022   CAD (coronary artery disease) 09/02/2021   Prediabetes 06/15/2020   Hyperlipidemia 04/27/2017   NSTEMI (non-ST elevated myocardial infarction) ([place]) 04/24/2017   History of colonic polyps    Diverticulosis of colon without hemorrhage    Family history of colon cancer 06/11/2015   Chronic foot pain 03/08/2015   Idiopathic peripheral neuropathy 05/31/2014   Lumbar radiculopathy 11/21/2013   Pain in joint, shoulder region 07/21/2012   Nocturnal muscle cramps 01/19/2012   Hypertension 11/10/2011   CKD (chronic kidney disease) 11/10/2011   Osteoarthritis of knee 09/16/2011    PCP: Ihor Dow MD  REFERRING PROVIDER: Earlie Server, MD  REFERRING DIAG: right total knee replacement  THERAPY DIAG:  Right knee pain, unspecified chronicity  Rationale for Evaluation and Treatment: Rehabilitation  ONSET DATE: 05/16/22  SUBJECTIVE:   SUBJECTIVE STATEMENT: His knee is sore from yesterday's session. No pain, just sore.   PERTINENT HISTORY: RT TKA  05/16/22  PAIN:  Are you having pain? No  PRECAUTIONS: None  WEIGHT BEARING RESTRICTIONS: No  FALLS:  Has patient fallen in last 6 months? No  LIVING ENVIRONMENT: Lives with: lives with their family and lives with their spouse Lives in: House/apartment Stairs: Yes: External: 2 steps; none Has following equipment at home: Single point cane and Environmental consultant - 2 wheeled  OCCUPATION: Retired, part time driver   PLOF: Independent  PATIENT GOALS: To get better   NEXT MD VISIT:   OBJECTIVE:   DIAGNOSTIC FINDINGS: NA  PATIENT SURVEYS:  FOTO 55% (was 47% function)   COGNITION: Overall cognitive status: Within functional limits for tasks assessed     SENSATION: WFL  EDEMA:  Minimal    LOWER EXTREMITY ROM:  Active ROM Right eval  Left eval Right 06/10/22 Right 06/26/22 Right 07/03/22 Right 07/07/22  Hip flexion        Hip extension        Hip abduction        Hip adduction        Hip internal rotation        Hip external rotation        Knee flexion 92 115 105 113 115 110  Knee extension -5 0 -3 -3 -7 -2 (after heel prop)  Ankle dorsiflexion        Ankle plantarflexion        Ankle inversion        Ankle eversion         (Blank rows = not tested)  LOWER EXTREMITY MMT:  MMT Right eval Left eval Right  07/07/22  Hip flexion 4+ 5 5  Hip extension     Hip abduction     Hip adduction     Hip internal rotation     Hip external rotation     Knee flexion 4+ 5 5  Knee  extension 4 5 4+  Ankle dorsiflexion 5 5 5  $ Ankle plantarflexion     Ankle inversion     Ankle eversion      (Blank rows = not tested)   FUNCTIONAL TESTS:  2 minute walk test: 280 feet with no AD (was 280 feet with RW)   GAIT: Decreased stride, decreased stance on RLE, no AD   TODAY'S TREATMENT:                                                                                                                              DATE:  07/17/22 Rec bike 5 min lv 4 (seat 15)  Standing:  Slant board 3 x 30"  HR x 20 TR x 20 Knee drive on 8 inch box 10 x 10"  Stairs 7 inch 5RT single rail, reciprocal  Tandem stance 2 x 30" Sit to stands 2 x 10 (slow eccentric)   Heel prop 3 min with 3lb   Manual RT knee flexion/ extension PROM  RT knee AROM: -2 to 112 deg  07/09/22 Rec bike 5 min lv 4 (seat 15) Standing: Slant board 3 x 30"   Knee flexion stretch 12" 10X 10" holds Standing hip abduction 2lb x20 Standing hip extension 2lb x20 Standing knee flexion 2lb x20 Step up 6 inch x 20 HHA x 1 Step down lateral 6 inch x20 HHA x 1 TKE 3 plates x20  Tandem stance 2 x 30" Sit to stands 2 x 10  Manual RT knee flexion/ extension PROM RT knee AROM: -5 to 112 deg  07/09/22 Rec bike 5 min lv 4 (seat 15)  Slant board 3 x 30"  HR x20 TR  x20 Standing hip abduction 2lb x20 Standing hip extension 2lb x20 Standing knee flexion 2lb x20 Sit to stands 2 x 10  Step up 6 inch x 20 HHA x 1 Step down 6 inch x20 HHA x 1 TKE 3 plates x20  Tandem stance 2 x 30"  Manual RT knee flexion/ extension PROM RT knee AROM: -2 to 113 deg  PATIENT EDUCATION:  Education details: on eval findings, POC and HEP  Person educated: Patient Education method: Explanation Education comprehension: verbalized understanding  HOME EXERCISE PROGRAM: Access Code: [email or web address] URL: [email or web address] 07/07/22 - Supine Heel Slide with Strap  - 5 x daily - 7 x weekly - 2 sets - 10 reps - 5 second hold - Supine Knee Extension Mobilization with Weight  - 5 x daily - 7 x weekly - 1 sets - 1 reps - 5 -10 min hold  06/19/22 - Supine Heel Slide with Strap  - 3 x daily - 7 x weekly - 2 sets - 10 reps - 5 second hold - Heel Raises with Counter Support  - 3 x daily - 7 x weekly - 2 sets - 10 reps - Sit to Stand with Counter Support  - 3 x daily - 7 x weekly - 1-2 sets -  10 reps - Standing Tandem Balance with Counter Support  - 3 x daily - 7 x weekly - 1 sets - 3 reps - 30 second hold - Standing Knee Flexion with Counter Support  - 3 x daily - 7 x weekly - 2 sets - 10 reps   Date: 06/05/2022 Prepared by: Josue Hector Exercises - Supine Quad Set  - 3 x daily - 7 x weekly - 2 sets - 10 reps - 5 second hold - Supine Active Straight Leg Raise  - 3 x daily - 7 x weekly - 2 sets - 10 reps - Supine Heel Slide  - 3 x daily - 7 x weekly - 2 sets - 10 reps - 5 second hold - Supine Bridge  - 3 x daily - 7 x weekly - 2 sets - 10 reps  ASSESSMENT:  CLINICAL IMPRESSION: Patient continues to struggle with regaining last degrees of full knee AROM. This really the only limiting factor at this point. Patient states he is compliant with increased HEP frequency. He is limited by significant muscle guarding which limits effectiveness of passive manual stretching.  Some improvement made with prolonged static holds (prone hang, heel prop). Will continue to progress as tolerated. Patient will continue to benefit from skilled therapy services to reduce remaining deficits and improve functional ability.     OBJECTIVE IMPAIRMENTS: Abnormal gait, decreased activity tolerance, decreased mobility, difficulty walking, decreased ROM, decreased strength, hypomobility, increased edema, impaired flexibility, improper body mechanics, and pain.   ACTIVITY LIMITATIONS: carrying, lifting, bending, sitting, standing, squatting, sleeping, stairs, transfers, bed mobility, and locomotion level  PARTICIPATION LIMITATIONS: meal prep, cleaning, laundry, driving, shopping, community activity, occupation, and yard work  PERSONAL FACTORS:  NA  are also affecting patient's functional outcome.   REHAB POTENTIAL: Good  CLINICAL DECISION MAKING: Stable/uncomplicated  EVALUATION COMPLEXITY: Low   GOALS: SHORT TERM GOALS: Target date: 06/26/2022  Patient will be independent with initial HEP and self-management strategies to improve functional outcomes Baseline:  Goal status: MET   LONG TERM GOALS: Target date: 07/17/2022  Patient will be independent with advanced HEP and self-management strategies to improve functional outcomes Baseline:  Goal status: IN PROGRESS  2.  Patient will improve FOTO score to predicted value to indicate improvement in functional outcomes Baseline: 55% function  Goal status: IN PROGRESS  3.  Patient will have RT knee AROM 0-120 degrees to improve functional mobility and facilitate squatting to pick up items from floor. Baseline: -2 to 110 deg Goal status: IN PROGRESS  4. Patient will have equal to or > 4+/5 MMT throughout BLE to improve ability to perform functional mobility, stair ambulation and ADLs.  Baseline: See MMT Goal status: MET  5. Patient will be able to ambulate at least 350 feet during 2MWT with LRAD to demonstrate improved  ability to perform functional mobility and associated tasks. Baseline: 280 feet with no AD  Goal status: IN PROGRESS  PLAN:  PT FREQUENCY: 2x/week  PT DURATION: 4 weeks  PLANNED INTERVENTIONS: Therapeutic exercises, Therapeutic activity, Neuromuscular re-education, Balance training, Gait training, Patient/Family education, Joint manipulation, Joint mobilization, Stair training, Aquatic Therapy, Dry Needling, Electrical stimulation, Spinal manipulation, Spinal mobilization, Cryotherapy, Moist heat, scar mobilization, Taping, Traction, Ultrasound, Biofeedback, Ionotophoresis 78m/ml Dexamethasone, and Manual therapy.   PLAN FOR NEXT SESSION: Progress knee ROM, glute and quad strengthening as tolerated. [place] to [facility] in March.   3:13 PM, 07/17/22 CJosue HectorPT DPT  Physical Therapist with [facility] (  [phone]   

## 2022-07-17 NOTE — Patient Instructions (Addendum)
Medication Instructions:  Your physician recommends that you continue on your current medications as directed. Please refer to the Current Medication list given to you today.   Labwork: Fasting Lipids  Testing/Procedures: None today   Follow-Up: 6 months  Any Other Special Instructions Will Be Listed Below (If Applicable).  If you need a refill on your cardiac medications before your next appointment, please call your pharmacy.

## 2022-07-22 ENCOUNTER — Ambulatory Visit (HOSPITAL_COMMUNITY): Payer: Medicare Other | Admitting: Physical Therapy

## 2022-07-22 DIAGNOSIS — M25661 Stiffness of right knee, not elsewhere classified: Secondary | ICD-10-CM

## 2022-07-22 DIAGNOSIS — M25561 Pain in right knee: Secondary | ICD-10-CM

## 2022-07-22 NOTE — Therapy (Signed)
OUTPATIENT PHYSICAL THERAPY TREATMENT   Patient Name: Kyle Patel. 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 s/p NSTEMI in 03/2017 with 100% distal LAD stenosis treated with POBA as stent could not be delivered   Chronic kidney disease    COPD (chronic obstructive pulmonary disease) ([place])    Essential hypertension    Hereditary and idiopathic peripheral neuropathy 05/2014   Hyperlipidemia    Pulmonary embolism ([place]) 10/2011   Past Surgical History:  Procedure Laterality Date   ABDOMINAL SURGERY     colon polyp removed 2007- sepsis, coma for 4 months per pt   BIOPSY  12/24/2020   Procedure: BIOPSY;  Surgeon: Eloise Harman, DO;  [facility];  Service: Endoscopy;;   CATARACT EXTRACTION W/PHACO Right 08/19/2021   Procedure: CATARACT EXTRACTION PHACO AND INTRAOCULAR LENS PLACEMENT ([place]);  Surgeon: Baruch Goldmann, MD;  Location: [facility];  Service: Ophthalmology;  Laterality: Right;  CDE: 7.02    CATARACT EXTRACTION W/PHACO Left 09/30/2021   Procedure: CATARACT EXTRACTION PHACO AND INTRAOCULAR LENS PLACEMENT ([facility]);  Surgeon: Baruch Goldmann, MD;  Location: [facility];  Service: Ophthalmology;  Laterality: Left;  CDE 4.61   COLONOSCOPY  03/22/2010   RMR: 1. Normal rectum 2. Diminutive polyp at the mouth of the ileocecal valve  status post cold biopsy removal. Remaideer of the  colonic mucosa and terminal ileum mucosa appeared normal.    COLONOSCOPY N/A 06/25/2015   Procedure: COLONOSCOPY;  Surgeon: Daneil Dolin, MD;  [facility];  Service: Endoscopy;  Laterality: N/A;  200-rescheduled 1/30 per [place]    COLONOSCOPY WITH PROPOFOL N/A 12/24/2020   Procedure: COLONOSCOPY WITH PROPOFOL;  Surgeon: Eloise Harman, DO;  [facility];  Service: Endoscopy;  Laterality: N/A;  ASA III / 8:00   CORONARY BALLOON ANGIOPLASTY N/A 04/24/2017   Procedure: CORONARY BALLOON ANGIOPLASTY;  Surgeon: Jettie Booze, MD;  Location: [place] [facility];  Service: Cardiovascular;  Laterality: N/A;   LEFT HEART CATH AND CORONARY ANGIOGRAPHY N/A 04/24/2017   Procedure: LEFT HEART CATH AND CORONARY ANGIOGRAPHY;  Surgeon: Jettie Booze, MD;  Location: [place] [facility];  Service: Cardiovascular;  Laterality: N/A;   LUMBAR LAMINECTOMY  06/02/2012   LUMBAR LAMINECTOMY/DECOMPRESSION MICRODISCECTOMY  06/02/2012   Procedure: LUMBAR LAMINECTOMY/DECOMPRESSION MICRODISCECTOMY 1 LEVEL;  Surgeon: Floyce Stakes, MD;  Location: [place] [facility];  Service: Neurosurgery;  Laterality: Bilateral;  Bilateral Lumbar five-sacral one Foraminotomy and microdiskectomy    POLYPECTOMY  12/24/2020   Procedure: POLYPECTOMY;  Surgeon: Eloise Harman, DO;  [facility];  Service: Endoscopy;;   TOTAL KNEE ARTHROPLASTY Right 05/16/2022   Procedure: TOTAL KNEE  ARTHROPLASTY;  Surgeon: Earlie Server, MD;  Location: [facility];  Service: Orthopedics;  Laterality: Right;   Patient Active Problem List   Diagnosis Date Noted   Type 2 diabetes mellitus with stage 3b chronic kidney disease, without long-term current use of insulin ([place]) 05/29/2022   Hospital discharge follow-up 05/29/2022   Hyperkalemia 05/19/2022   Acute respiratory failure with hypoxia ([place]) 05/18/2022   S/P total knee arthroplasty, right 05/18/2022   COPD with acute exacerbation ([place]) 05/18/2022   Primary localized  osteoarthritis of right knee 05/16/2022   Encounter for general adult medical examination with abnormal findings 05/12/2022   Preop examination 02/21/2022   Leg swelling 02/21/2022   CAD (coronary artery disease) 09/02/2021   Prediabetes 06/15/2020   Hyperlipidemia 04/27/2017   NSTEMI (non-ST elevated myocardial infarction) ([place]) 04/24/2017   History of colonic polyps    Diverticulosis of colon without hemorrhage    Family history of colon cancer 06/11/2015   Chronic foot pain 03/08/2015   Idiopathic peripheral neuropathy 05/31/2014   Lumbar radiculopathy 11/21/2013   Pain in joint, shoulder region 07/21/2012   Nocturnal muscle cramps 01/19/2012   Hypertension 11/10/2011   CKD (chronic kidney disease) 11/10/2011   Osteoarthritis of knee 09/16/2011    PCP: Ihor Dow MD  REFERRING PROVIDER: Earlie Server, MD  REFERRING DIAG: right total knee replacement  THERAPY DIAG:  Right knee pain, unspecified chronicity  Stiffness of right knee, not elsewhere classified  Rationale for Evaluation and Treatment: Rehabilitation  ONSET DATE: 05/16/22  SUBJECTIVE:   SUBJECTIVE STATEMENT: Feels sore in knee after he goes for walks. No cane today.   PERTINENT HISTORY: RT TKA  05/16/22  PAIN:  Are you having pain? No  PRECAUTIONS: None  WEIGHT BEARING RESTRICTIONS: No  FALLS:  Has patient fallen in last 6 months? No  LIVING ENVIRONMENT: Lives with: lives with their family and lives with their spouse Lives in: House/apartment Stairs: Yes: External: 2 steps; none Has following equipment at home: Single point cane and Environmental consultant - 2 wheeled  OCCUPATION: Retired, part time driver   PLOF: Independent  PATIENT GOALS: To get better   NEXT MD VISIT:   OBJECTIVE:   DIAGNOSTIC FINDINGS: NA  PATIENT SURVEYS:  FOTO 55% (was 47% function)   COGNITION: Overall cognitive status: Within functional limits for tasks assessed     SENSATION: WFL  EDEMA:  Minimal    LOWER  EXTREMITY ROM:  Active ROM Right eval Left eval Right 06/10/22 Right 06/26/22 Right 07/03/22 Right 07/07/22  Hip flexion        Hip extension        Hip abduction        Hip adduction        Hip internal rotation        Hip external rotation        Knee flexion 92 115 105 113 115 110  Knee extension -5 0 -3 -3 -7 -2 (after heel prop)  Ankle dorsiflexion        Ankle plantarflexion        Ankle inversion        Ankle eversion         (Blank rows = not tested)  LOWER EXTREMITY MMT:  MMT Right eval Left eval Right  07/07/22  Hip flexion 4+ 5 5  Hip extension     Hip abduction     Hip adduction     Hip internal rotation     Hip external rotation  Knee flexion 4+ 5 5  Knee extension 4 5 4+  Ankle dorsiflexion '5 5 5  '$ Ankle plantarflexion     Ankle inversion     Ankle eversion      (Blank rows = not tested)   FUNCTIONAL TESTS:  2 minute walk test: 280 feet with no AD (was 280 feet with RW)   GAIT: Decreased stride, decreased stance on RLE, no AD   TODAY'S TREATMENT:                                                                                                                              DATE:  07/22/22 Rec bike 5 min lv 4 (seat 1)  Standing:  Slant board 3 x 30"   TKE 4 plates 20 x 3"    Seated HS curl machine 5 plates 2 x 10  Leg press 3 plates 2 x 10   Manual RT knee flexion/ extension PROM in seated and supine with contract relax   RT knee AROM: -2 to 112 deg (passively stretched to 121 deg flexion)   07/17/22 Rec bike 5 min lv 4 (seat 15)  Standing:  Slant board 3 x 30"  HR x 20 TR x 20 Knee drive on 8 inch box 10 x 10"  Stairs 7 inch 5RT single rail, reciprocal  Tandem stance 2 x 30" Sit to stands 2 x 10 (slow eccentric)   Heel prop 3 min with 3lb   Manual RT knee flexion/ extension PROM  RT knee AROM: -2 to 112 deg  07/09/22 Rec bike 5 min lv 4 (seat 15) Standing: Slant board 3 x 30"   Knee flexion stretch 12" 10X 10" holds Standing  hip abduction 2lb x20 Standing hip extension 2lb x20 Standing knee flexion 2lb x20 Step up 6 inch x 20 HHA x 1 Step down lateral 6 inch x20 HHA x 1 TKE 3 plates x20  Tandem stance 2 x 30" Sit to stands 2 x 10  Manual RT knee flexion/ extension PROM RT knee AROM: -5 to 112 deg  07/09/22 Rec bike 5 min lv 4 (seat 15)  Slant board 3 x 30"  HR x20 TR x20 Standing hip abduction 2lb x20 Standing hip extension 2lb x20 Standing knee flexion 2lb x20 Sit to stands 2 x 10  Step up 6 inch x 20 HHA x 1 Step down 6 inch x20 HHA x 1 TKE 3 plates x20  Tandem stance 2 x 30"  Manual RT knee flexion/ extension PROM RT knee AROM: -2 to 113 deg  PATIENT EDUCATION:  Education details: on eval findings, POC and HEP  Person educated: Patient Education method: Explanation Education comprehension: verbalized understanding  HOME EXERCISE PROGRAM: Access Code: [email or web address] URL: [email or web address] 07/07/22 - Supine Heel Slide with Strap  - 5 x daily - 7 x weekly - 2 sets - 10 reps - 5 second hold - Supine Knee Extension Mobilization with Weight  - 5  x daily - 7 x weekly - 1 sets - 1 reps - 5 -10 min hold  06/19/22 - Supine Heel Slide with Strap  - 3 x daily - 7 x weekly - 2 sets - 10 reps - 5 second hold - Heel Raises with Counter Support  - 3 x daily - 7 x weekly - 2 sets - 10 reps - Sit to Stand with Counter Support  - 3 x daily - 7 x weekly - 1-2 sets - 10 reps - Standing Tandem Balance with Counter Support  - 3 x daily - 7 x weekly - 1 sets - 3 reps - 30 second hold - Standing Knee Flexion with Counter Support  - 3 x daily - 7 x weekly - 2 sets - 10 reps   Date: 06/05/2022 Prepared by: Josue Hector Exercises - Supine Quad Set  - 3 x daily - 7 x weekly - 2 sets - 10 reps - 5 second hold - Supine Active Straight Leg Raise  - 3 x daily - 7 x weekly - 2 sets - 10 reps - Supine Heel Slide  - 3 x daily - 7 x weekly - 2 sets - 10 reps - 5 second hold - Supine Bridge  - 3 x  daily - 7 x weekly - 2 sets - 10 reps  ASSESSMENT:  CLINICAL IMPRESSION: Added machines for LE strength progressions. Patient educated on purpose and function of added exercises. Patient performs new exercise in slow controlled manner. Ongoing difficulty with achieving remaining degrees of full knee AROM. Patient will continue to benefit from skilled therapy services to reduce remaining deficits and improve functional ability.    OBJECTIVE IMPAIRMENTS: Abnormal gait, decreased activity tolerance, decreased mobility, difficulty walking, decreased ROM, decreased strength, hypomobility, increased edema, impaired flexibility, improper body mechanics, and pain.   ACTIVITY LIMITATIONS: carrying, lifting, bending, sitting, standing, squatting, sleeping, stairs, transfers, bed mobility, and locomotion level  PARTICIPATION LIMITATIONS: meal prep, cleaning, laundry, driving, shopping, community activity, occupation, and yard work  PERSONAL FACTORS:  NA  are also affecting patient's functional outcome.   REHAB POTENTIAL: Good  CLINICAL DECISION MAKING: Stable/uncomplicated  EVALUATION COMPLEXITY: Low   GOALS: SHORT TERM GOALS: Target date: 06/26/2022  Patient will be independent with initial HEP and self-management strategies to improve functional outcomes Baseline:  Goal status: MET   LONG TERM GOALS: Target date: 07/17/2022  Patient will be independent with advanced HEP and self-management strategies to improve functional outcomes Baseline:  Goal status: IN PROGRESS  2.  Patient will improve FOTO score to predicted value to indicate improvement in functional outcomes Baseline: 55% function  Goal status: IN PROGRESS  3.  Patient will have RT knee AROM 0-120 degrees to improve functional mobility and facilitate squatting to pick up items from floor. Baseline: -2 to 110 deg Goal status: IN PROGRESS  4. Patient will have equal to or > 4+/5 MMT throughout BLE to improve ability to perform  functional mobility, stair ambulation and ADLs.  Baseline: See MMT Goal status: MET  5. Patient will be able to ambulate at least 350 feet during 2MWT with LRAD to demonstrate improved ability to perform functional mobility and associated tasks. Baseline: 280 feet with no AD  Goal status: IN PROGRESS  PLAN:  PT FREQUENCY: 2x/week  PT DURATION: 4 weeks  PLANNED INTERVENTIONS: Therapeutic exercises, Therapeutic activity, Neuromuscular re-education, Balance training, Gait training, Patient/Family education, Joint manipulation, Joint mobilization, Stair training, Aquatic Therapy, Dry Needling, Electrical stimulation, Spinal manipulation, Spinal mobilization,  Cryotherapy, Moist heat, scar mobilization, Taping, Traction, Ultrasound, Biofeedback, Ionotophoresis '4mg'$ /ml Dexamethasone, and Manual therapy.   PLAN FOR NEXT SESSION: Progress knee ROM, glute and quad strengthening as tolerated.    3:13 PM, 07/22/22 Josue Hector PT DPT  Physical Therapist with [facility]  [phone]

## 2022-07-24 ENCOUNTER — Ambulatory Visit (HOSPITAL_COMMUNITY): Payer: Medicare Other | Admitting: Physical Therapy

## 2022-07-24 DIAGNOSIS — M25561 Pain in right knee: Secondary | ICD-10-CM

## 2022-07-24 DIAGNOSIS — M25661 Stiffness of right knee, not elsewhere classified: Secondary | ICD-10-CM

## 2022-07-24 DIAGNOSIS — R2689 Other abnormalities of gait and mobility: Secondary | ICD-10-CM

## 2022-07-24 NOTE — Therapy (Signed)
OUTPATIENT PHYSICAL THERAPY TREATMENT   Patient Name: Kyle Patel. 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 s/p NSTEMI in 03/2017 with 100% distal LAD stenosis treated with POBA as stent could not be delivered   Chronic kidney disease    COPD (chronic obstructive pulmonary disease) ([place])    Essential hypertension    Hereditary and idiopathic peripheral neuropathy 05/2014   Hyperlipidemia    Pulmonary embolism ([place]) 10/2011   Past Surgical History:  Procedure Laterality Date   ABDOMINAL SURGERY     colon polyp removed 2007- sepsis, coma for 4 months per pt   BIOPSY  12/24/2020   Procedure: BIOPSY;  Surgeon: Eloise Harman, DO;  [facility];  Service: Endoscopy;;   CATARACT EXTRACTION W/PHACO Right 08/19/2021   Procedure: CATARACT EXTRACTION PHACO AND INTRAOCULAR LENS PLACEMENT ([place]);  Surgeon: Baruch Goldmann, MD;  Location: [facility];  Service: Ophthalmology;  Laterality: Right;  CDE: 7.02    CATARACT EXTRACTION W/PHACO Left 09/30/2021   Procedure: CATARACT EXTRACTION PHACO AND INTRAOCULAR LENS PLACEMENT ([facility]);  Surgeon: Baruch Goldmann, MD;  Location: [facility];  Service: Ophthalmology;  Laterality: Left;  CDE 4.61   COLONOSCOPY  03/22/2010   RMR: 1. Normal rectum 2. Diminutive polyp at the mouth of the ileocecal valve  status post cold biopsy removal. Remaideer of the  colonic mucosa and terminal ileum mucosa appeared normal.    COLONOSCOPY N/A 06/25/2015   Procedure: COLONOSCOPY;  Surgeon: Daneil Dolin, MD;  [facility];  Service: Endoscopy;  Laterality: N/A;  200-rescheduled 1/30 per [place]    COLONOSCOPY WITH PROPOFOL N/A 12/24/2020   Procedure: COLONOSCOPY WITH PROPOFOL;  Surgeon: Eloise Harman, DO;  [facility];  Service: Endoscopy;  Laterality: N/A;  ASA III / 8:00   CORONARY BALLOON ANGIOPLASTY N/A 04/24/2017   Procedure: CORONARY BALLOON ANGIOPLASTY;  Surgeon: Jettie Booze, MD;  Location: [place] [facility];  Service: Cardiovascular;  Laterality: N/A;   LEFT HEART CATH AND CORONARY ANGIOGRAPHY N/A 04/24/2017   Procedure: LEFT HEART CATH AND CORONARY ANGIOGRAPHY;  Surgeon: Jettie Booze, MD;  Location: [place] [facility];  Service: Cardiovascular;  Laterality: N/A;   LUMBAR LAMINECTOMY  06/02/2012   LUMBAR LAMINECTOMY/DECOMPRESSION MICRODISCECTOMY  06/02/2012   Procedure: LUMBAR LAMINECTOMY/DECOMPRESSION MICRODISCECTOMY 1 LEVEL;  Surgeon: Floyce Stakes, MD;  Location: [place] [facility];  Service: Neurosurgery;  Laterality: Bilateral;  Bilateral Lumbar five-sacral one Foraminotomy and microdiskectomy    POLYPECTOMY  12/24/2020   Procedure: POLYPECTOMY;  Surgeon: Eloise Harman, DO;  [facility];  Service: Endoscopy;;   TOTAL KNEE ARTHROPLASTY Right 05/16/2022   Procedure: TOTAL KNEE  ARTHROPLASTY;  Surgeon: Earlie Server, MD;  Location: [facility];  Service: Orthopedics;  Laterality: Right;   Patient Active Problem List   Diagnosis Date Noted   Type 2 diabetes mellitus with stage 3b chronic kidney disease, without long-term current use of insulin ([place]) 05/29/2022   Hospital discharge follow-up 05/29/2022   Hyperkalemia 05/19/2022   Acute respiratory failure with hypoxia ([place]) 05/18/2022   S/P total knee arthroplasty, right 05/18/2022   COPD with acute exacerbation ([place]) 05/18/2022   Primary localized  osteoarthritis of right knee 05/16/2022   Encounter for general adult medical examination with abnormal findings 05/12/2022   Preop examination 02/21/2022   Leg swelling 02/21/2022   CAD (coronary artery disease) 09/02/2021   Prediabetes 06/15/2020   Hyperlipidemia 04/27/2017   NSTEMI (non-ST elevated myocardial infarction) ([place]) 04/24/2017   History of colonic polyps    Diverticulosis of colon without hemorrhage    Family history of colon cancer 06/11/2015   Chronic foot pain 03/08/2015   Idiopathic peripheral neuropathy 05/31/2014   Lumbar radiculopathy 11/21/2013   Pain in joint, shoulder region 07/21/2012   Nocturnal muscle cramps 01/19/2012   Hypertension 11/10/2011   CKD (chronic kidney disease) 11/10/2011   Osteoarthritis of knee 09/16/2011    PCP: Ihor Dow MD  REFERRING PROVIDER: Earlie Server, MD  REFERRING DIAG: right total knee replacement  THERAPY DIAG:  Right knee pain, unspecified chronicity  Stiffness of right knee, not elsewhere classified  Other abnormalities of gait and mobility  Rationale for Evaluation and Treatment: Rehabilitation  ONSET DATE: 05/16/22  SUBJECTIVE:   SUBJECTIVE STATEMENT: A little more sore after last visit. Walking fine without cane. No falls.   PERTINENT HISTORY: RT TKA  05/16/22  PAIN:  Are you having pain? No  PRECAUTIONS: None  WEIGHT BEARING RESTRICTIONS: No  FALLS:  Has patient fallen in last 6 months? No  LIVING ENVIRONMENT: Lives with: lives with their family and lives with their spouse Lives in: House/apartment Stairs: Yes: External: 2 steps; none Has following equipment at home: Single point cane and Environmental consultant - 2 wheeled  OCCUPATION: Retired, part time driver   PLOF: Independent  PATIENT GOALS: To get better   NEXT MD VISIT:   OBJECTIVE:   DIAGNOSTIC FINDINGS: NA  PATIENT SURVEYS:  FOTO 55% (was 47% function)   COGNITION: Overall cognitive status: Within functional limits for tasks  assessed     SENSATION: WFL  EDEMA:  Minimal    LOWER EXTREMITY ROM:  Active ROM Right eval Left eval Right 06/10/22 Right 06/26/22 Right 07/03/22 Right 07/07/22  Hip flexion        Hip extension        Hip abduction        Hip adduction        Hip internal rotation        Hip external rotation        Knee flexion 92 115 105 113 115 110  Knee extension -5 0 -3 -3 -7 -2 (after heel prop)  Ankle dorsiflexion        Ankle plantarflexion        Ankle inversion        Ankle eversion         (Blank rows = not tested)  LOWER EXTREMITY MMT:  MMT Right eval Left eval Right  07/07/22  Hip flexion 4+ 5 5  Hip extension     Hip abduction     Hip adduction     Hip internal rotation  Hip external rotation     Knee flexion 4+ 5 5  Knee extension 4 5 4+  Ankle dorsiflexion '5 5 5  '$ Ankle plantarflexion     Ankle inversion     Ankle eversion      (Blank rows = not tested)   FUNCTIONAL TESTS:  2 minute walk test: 280 feet with no AD (was 280 feet with RW)   GAIT: Decreased stride, decreased stance on RLE, no AD   TODAY'S TREATMENT:                                                                                                                              DATE:   07/24/22 Rec bike 5 min lv 4 (seat 14)  Standing:  Slant board 3 x 30"   TKE 4 plates 20 x 3"    Seated HS curl machine 5 plates 3 x 10  Leg press 4 plates 3 x 10    Stairs 7 inch 3RT single rail, reciprocal   Manual RT knee flexion/ extension PROM in prone and supine with contract relax   RT knee AROM: -3 to 110 deg (passively stretched to 121 deg flexion)    07/22/22 Rec bike 5 min lv 4 (seat 14)  Standing:  Slant board 3 x 30"   TKE 4 plates 20 x 3"    Seated HS curl machine 5 plates 2 x 10  Leg press 3 plates 2 x 10   Manual RT knee flexion/ extension PROM in seated and supine with contract relax   RT knee AROM: -2 to 112 deg (passively stretched to 121 deg flexion)   07/17/22 Rec bike 5  min lv 4 (seat 15)  Standing:  Slant board 3 x 30"  HR x 20 TR x 20 Knee drive on 8 inch box 10 x 10"  Stairs 7 inch 5RT single rail, reciprocal  Tandem stance 2 x 30" Sit to stands 2 x 10 (slow eccentric)   Heel prop 3 min with 3lb   Manual RT knee flexion/ extension PROM  RT knee AROM: -2 to 112 deg  07/09/22 Rec bike 5 min lv 4 (seat 15) Standing: Slant board 3 x 30"   Knee flexion stretch 12" 10X 10" holds Standing hip abduction 2lb x20 Standing hip extension 2lb x20 Standing knee flexion 2lb x20 Step up 6 inch x 20 HHA x 1 Step down lateral 6 inch x20 HHA x 1 TKE 3 plates x20  Tandem stance 2 x 30" Sit to stands 2 x 10  Manual RT knee flexion/ extension PROM RT knee AROM: -5 to 112 deg  07/09/22 Rec bike 5 min lv 4 (seat 15)  Slant board 3 x 30"  HR x20 TR x20 Standing hip abduction 2lb x20 Standing hip extension 2lb x20 Standing knee flexion 2lb x20 Sit to stands 2 x 10  Step up 6 inch x 20 HHA x 1 Step down 6 inch  x20 HHA x 1 TKE 3 plates x20  Tandem stance 2 x 30"  Manual RT knee flexion/ extension PROM RT knee AROM: -2 to 113 deg  PATIENT EDUCATION:  Education details: on eval findings, POC and HEP  Person educated: Patient Education method: Explanation Education comprehension: verbalized understanding  HOME EXERCISE PROGRAM: Access Code: [email or web address] URL: [email or web address] 07/07/22 - Supine Heel Slide with Strap  - 5 x daily - 7 x weekly - 2 sets - 10 reps - 5 second hold - Supine Knee Extension Mobilization with Weight  - 5 x daily - 7 x weekly - 1 sets - 1 reps - 5 -10 min hold  06/19/22 - Supine Heel Slide with Strap  - 3 x daily - 7 x weekly - 2 sets - 10 reps - 5 second hold - Heel Raises with Counter Support  - 3 x daily - 7 x weekly - 2 sets - 10 reps - Sit to Stand with Counter Support  - 3 x daily - 7 x weekly - 1-2 sets - 10 reps - Standing Tandem Balance with Counter Support  - 3 x daily - 7 x weekly - 1 sets - 3  reps - 30 second hold - Standing Knee Flexion with Counter Support  - 3 x daily - 7 x weekly - 2 sets - 10 reps   Date: 06/05/2022 Prepared by: Josue Hector Exercises - Supine Quad Set  - 3 x daily - 7 x weekly - 2 sets - 10 reps - 5 second hold - Supine Active Straight Leg Raise  - 3 x daily - 7 x weekly - 2 sets - 10 reps - Supine Heel Slide  - 3 x daily - 7 x weekly - 2 sets - 10 reps - 5 second hold - Supine Bridge  - 3 x daily - 7 x weekly - 2 sets - 10 reps  ASSESSMENT:  CLINICAL IMPRESSION:  Patient has improved knee PROM but still struggling with AROM. Able to passively bend knee to 121 deg but patient limited to 1110 AROM. Of note, LT knee AROM flexion is 112 deg. Will continue to progress 2-3 more visits with additional strengthening for maximized mobility. Patient will continue to benefit from skilled therapy services to reduce remaining deficits and improve functional ability.    OBJECTIVE IMPAIRMENTS: Abnormal gait, decreased activity tolerance, decreased mobility, difficulty walking, decreased ROM, decreased strength, hypomobility, increased edema, impaired flexibility, improper body mechanics, and pain.   ACTIVITY LIMITATIONS: carrying, lifting, bending, sitting, standing, squatting, sleeping, stairs, transfers, bed mobility, and locomotion level  PARTICIPATION LIMITATIONS: meal prep, cleaning, laundry, driving, shopping, community activity, occupation, and yard work  PERSONAL FACTORS:  NA  are also affecting patient's functional outcome.   REHAB POTENTIAL: Good  CLINICAL DECISION MAKING: Stable/uncomplicated  EVALUATION COMPLEXITY: Low   GOALS: SHORT TERM GOALS: Target date: 06/26/2022  Patient will be independent with initial HEP and self-management strategies to improve functional outcomes Baseline:  Goal status: MET   LONG TERM GOALS: Target date: 07/17/2022  Patient will be independent with advanced HEP and self-management strategies to improve  functional outcomes Baseline:  Goal status: IN PROGRESS  2.  Patient will improve FOTO score to predicted value to indicate improvement in functional outcomes Baseline: 55% function  Goal status: IN PROGRESS  3.  Patient will have RT knee AROM 0-120 degrees to improve functional mobility and facilitate squatting to pick up items from floor. Baseline: -2 to 110 deg Goal status: IN PROGRESS  4. Patient will have equal to or > 4+/5 MMT throughout BLE to improve ability to perform functional mobility, stair ambulation and ADLs.  Baseline: See MMT Goal status: MET  5. Patient will be able to ambulate at least 350 feet during 2MWT with LRAD to demonstrate improved ability to perform functional mobility and associated tasks. Baseline: 280 feet with no AD  Goal status: IN PROGRESS  PLAN:  PT FREQUENCY: 2x/week  PT DURATION: 4 weeks  PLANNED INTERVENTIONS: Therapeutic exercises, Therapeutic activity, Neuromuscular re-education, Balance training, Gait training, Patient/Family education, Joint manipulation, Joint mobilization, Stair training, Aquatic Therapy, Dry Needling, Electrical stimulation, Spinal manipulation, Spinal mobilization, Cryotherapy, Moist heat, scar mobilization, Taping, Traction, Ultrasound, Biofeedback, Ionotophoresis '4mg'$ /ml Dexamethasone, and Manual therapy.   PLAN FOR NEXT SESSION: Progress knee ROM, glute and quad strengthening as tolerated.    4:03 PM, 07/24/22 Josue Hector PT DPT  Physical Therapist with [facility]  [phone]

## 2022-07-25 ENCOUNTER — Other Ambulatory Visit (HOSPITAL_COMMUNITY)
Admission: RE | Admit: 2022-07-25 | Discharge: 2022-07-25 | Disposition: A | Discharge status: Home / Self Care | Payer: Medicare Other | Source: Ambulatory Visit | Attending: Cardiology | Admitting: Cardiology

## 2022-07-25 DIAGNOSIS — I25119 Atherosclerotic heart disease of native coronary artery with unspecified angina pectoris: Secondary | ICD-10-CM | POA: Diagnosis present

## 2022-07-25 LAB — LIPID PANEL
Cholesterol: 148 mg/dL (ref 0–200)
HDL: 50 mg/dL (ref >40)
LDL Cholesterol: 86 mg/dL (ref 0–99)
Total CHOL/HDL Ratio: 3 RATIO
Triglycerides: 59 mg/dL (ref <150)
VLDL: 12 mg/dL (ref 0–40)

## 2022-07-28 ENCOUNTER — Ambulatory Visit (HOSPITAL_COMMUNITY): Payer: Medicare Other | Attending: Orthopedic Surgery | Admitting: Physical Therapy

## 2022-07-28 DIAGNOSIS — M25661 Stiffness of right knee, not elsewhere classified: Secondary | ICD-10-CM | POA: Diagnosis present

## 2022-07-28 DIAGNOSIS — R2689 Other abnormalities of gait and mobility: Secondary | ICD-10-CM

## 2022-07-28 DIAGNOSIS — M25561 Pain in right knee: Secondary | ICD-10-CM | POA: Insufficient documentation

## 2022-07-28 NOTE — Therapy (Signed)
OUTPATIENT PHYSICAL THERAPY TREATMENT   Patient Name: Kyle Patel. 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 s/p NSTEMI in 03/2017 with 100% distal LAD stenosis treated with POBA as stent could not be delivered   Chronic kidney disease    COPD (chronic obstructive pulmonary disease) ([place])    Essential hypertension    Hereditary and idiopathic peripheral neuropathy 05/2014   Hyperlipidemia    Pulmonary embolism ([place]) 10/2011   Past Surgical History:  Procedure Laterality Date   ABDOMINAL SURGERY     colon polyp removed 2007- sepsis, coma for 4 months per pt   BIOPSY  12/24/2020   Procedure: BIOPSY;  Surgeon: Eloise Harman, DO;  [facility];  Service: Endoscopy;;   CATARACT EXTRACTION W/PHACO Right 08/19/2021   Procedure: CATARACT EXTRACTION PHACO AND INTRAOCULAR LENS PLACEMENT ([place]);  Surgeon: Baruch Goldmann, MD;  Location: [facility];  Service: Ophthalmology;  Laterality: Right;  CDE: 7.02    CATARACT EXTRACTION W/PHACO Left 09/30/2021   Procedure: CATARACT EXTRACTION PHACO AND INTRAOCULAR LENS PLACEMENT ([facility]);  Surgeon: Baruch Goldmann, MD;  Location: [facility];  Service: Ophthalmology;  Laterality: Left;  CDE 4.61   COLONOSCOPY  03/22/2010   RMR: 1. Normal rectum 2. Diminutive polyp at the mouth of the ileocecal valve  status post cold biopsy removal. Remaideer of the  colonic mucosa and terminal ileum mucosa appeared normal.    COLONOSCOPY N/A 06/25/2015   Procedure: COLONOSCOPY;  Surgeon: Daneil Dolin, MD;  [facility];  Service: Endoscopy;  Laterality: N/A;  200-rescheduled 1/30 per [place]    COLONOSCOPY WITH PROPOFOL N/A 12/24/2020   Procedure: COLONOSCOPY WITH PROPOFOL;  Surgeon: Eloise Harman, DO;  [facility];  Service: Endoscopy;  Laterality: N/A;  ASA III / 8:00   CORONARY BALLOON ANGIOPLASTY N/A 04/24/2017   Procedure: CORONARY BALLOON ANGIOPLASTY;  Surgeon: Jettie Booze, MD;  Location: [facility];  Service: Cardiovascular;  Laterality: N/A;   LEFT HEART CATH AND CORONARY ANGIOGRAPHY N/A 04/24/2017   Procedure: LEFT HEART CATH AND CORONARY ANGIOGRAPHY;  Surgeon: Jettie Booze, MD;  Location: [facility];  Service: Cardiovascular;  Laterality: N/A;   LUMBAR LAMINECTOMY  06/02/2012   LUMBAR LAMINECTOMY/DECOMPRESSION MICRODISCECTOMY  06/02/2012   Procedure: LUMBAR LAMINECTOMY/DECOMPRESSION MICRODISCECTOMY 1 LEVEL;  Surgeon: Floyce Stakes, MD;  Location: [facility];  Service: Neurosurgery;  Laterality: Bilateral;  Bilateral Lumbar five-sacral one Foraminotomy and microdiskectomy    POLYPECTOMY  12/24/2020   Procedure: POLYPECTOMY;  Surgeon: Eloise Harman, DO;  [facility];  Service: Endoscopy;;   TOTAL KNEE ARTHROPLASTY Right 05/16/2022   Procedure: TOTAL KNEE  ARTHROPLASTY;  Surgeon: Earlie Server, MD;  Location: [facility];  Service: Orthopedics;  Laterality: Right;   Patient Active Problem List   Diagnosis Date Noted   Type 2 diabetes mellitus with stage 3b chronic kidney disease, without long-term current use of insulin ([place]) 05/29/2022   Hospital discharge follow-up 05/29/2022   Hyperkalemia 05/19/2022   Acute respiratory failure with hypoxia ([place]) 05/18/2022   S/P total knee arthroplasty, right 05/18/2022   COPD with acute exacerbation ([place]) 05/18/2022   Primary localized  osteoarthritis of right knee 05/16/2022   Encounter for general adult medical examination with abnormal findings 05/12/2022   Preop examination 02/21/2022   Leg swelling 02/21/2022   CAD (coronary artery disease) 09/02/2021   Prediabetes 06/15/2020   Hyperlipidemia 04/27/2017   NSTEMI (non-ST elevated myocardial infarction) ([place]) 04/24/2017   History of colonic polyps    Diverticulosis of colon without hemorrhage    Family history of colon cancer 06/11/2015   Chronic foot pain 03/08/2015   Idiopathic peripheral neuropathy 05/31/2014   Lumbar radiculopathy 11/21/2013   Pain in joint, shoulder region 07/21/2012   Nocturnal muscle cramps 01/19/2012   Hypertension 11/10/2011   CKD (chronic kidney disease) 11/10/2011   Osteoarthritis of knee 09/16/2011    PCP: Ihor Dow MD  REFERRING PROVIDER: Earlie Server, MD  REFERRING DIAG: right total knee replacement  THERAPY DIAG:  Right knee pain, unspecified chronicity  Stiffness of right knee, not elsewhere classified  Other abnormalities of gait and mobility  Rationale for Evaluation and Treatment: Rehabilitation  ONSET DATE: 05/16/22  SUBJECTIVE:   SUBJECTIVE STATEMENT: Knee is stiff today.   PERTINENT HISTORY: RT TKA  05/16/22  PAIN:  Are you having pain? No  PRECAUTIONS: None  WEIGHT BEARING RESTRICTIONS: No  FALLS:  Has patient fallen in last 6 months? No  LIVING ENVIRONMENT: Lives with: lives with their family and lives with their spouse Lives in: House/apartment Stairs: Yes: External: 2 steps; none Has following equipment at home: Single point cane and Environmental consultant - 2 wheeled  OCCUPATION: Retired, part time driver   PLOF: Independent  PATIENT GOALS: To get better   NEXT MD VISIT:   OBJECTIVE:   DIAGNOSTIC FINDINGS: NA  PATIENT SURVEYS:  FOTO 55% (was 47% function)   COGNITION: Overall cognitive status: Within functional limits for tasks assessed     SENSATION: WFL  EDEMA:  Minimal     LOWER EXTREMITY ROM:  Active ROM Right eval Left eval Right 06/10/22 Right 06/26/22 Right 07/03/22 Right 07/07/22  Hip flexion        Hip extension        Hip abduction        Hip adduction        Hip internal rotation        Hip external rotation        Knee flexion 92 115 105 113 115 110  Knee extension -5 0 -3 -3 -7 -2 (after heel prop)  Ankle dorsiflexion        Ankle plantarflexion        Ankle inversion        Ankle eversion         (Blank rows = not tested)  LOWER EXTREMITY MMT:  MMT Right eval Left eval Right  07/07/22  Hip flexion 4+ 5 5  Hip extension     Hip abduction     Hip adduction     Hip internal rotation     Hip external rotation     Knee  flexion 4+ 5 5  Knee extension 4 5 4+  Ankle dorsiflexion '5 5 5  '$ Ankle plantarflexion     Ankle inversion     Ankle eversion      (Blank rows = not tested)   FUNCTIONAL TESTS:  2 minute walk test: 280 feet with no AD (was 280 feet with RW)   GAIT: Decreased stride, decreased stance on RLE, no AD   TODAY'S TREATMENT:                                                                                                                              DATE:  07/28/22 Rec bike 5 min lv 4 (seat 12)  Standing:  Knee Drive 2nd step 10 x 10" Hamstring stretch at 1st step 10 x 10"  TKE 4 plates 20 x 3"     Seated HS curl machine 6 plates 2 x 15  Leg press 4 plates 2 x 15    Machine walkouts 4 plates x 10   Manual RT knee flexion/ extension PROM in prone and supine with contract relax   RT knee AROM: -3 to 115 deg (passively stretched to 120 deg flexion)   07/24/22 Rec bike 5 min lv 4 (seat 14)  Standing:  Slant board 3 x 30"   TKE 4 plates 20 x 3"    Seated HS curl machine 5 plates 3 x 10  Leg press 4 plates 3 x 10    Stairs 7 inch 3RT single rail, reciprocal   Manual RT knee flexion/ extension PROM in prone and supine with contract relax   RT knee AROM: -3 to 110 deg (passively stretched to 121 deg  flexion)    07/22/22 Rec bike 5 min lv 4 (seat 14)  Standing:  Slant board 3 x 30"   TKE 4 plates 20 x 3"    Seated HS curl machine 5 plates 2 x 10  Leg press 3 plates 2 x 10   Manual RT knee flexion/ extension PROM in seated and supine with contract relax   RT knee AROM: -2 to 112 deg (passively stretched to 121 deg flexion)   07/17/22 Rec bike 5 min lv 4 (seat 15)  Standing:  Slant board 3 x 30"  HR x 20 TR x 20 Knee drive on 8 inch box 10 x 10"  Stairs 7 inch 5RT single rail, reciprocal  Tandem stance 2 x 30" Sit to stands 2 x 10 (slow eccentric)   Heel prop 3 min with 3lb   Manual RT knee flexion/ extension PROM  RT knee AROM: -2 to 112 deg  PATIENT EDUCATION:  Education details: on eval findings, POC and HEP  Person educated: Patient Education method: Explanation Education comprehension: verbalized understanding  HOME EXERCISE PROGRAM: Access Code: [email or web address] URL: [email or web address] 07/07/22 - Supine Heel Slide with Strap  - 5 x daily - 7 x weekly - 2 sets - 10 reps - 5 second  hold - Supine Knee Extension Mobilization with Weight  - 5 x daily - 7 x weekly - 1 sets - 1 reps - 5 -10 min hold  06/19/22 - Supine Heel Slide with Strap  - 3 x daily - 7 x weekly - 2 sets - 10 reps - 5 second hold - Heel Raises with Counter Support  - 3 x daily - 7 x weekly - 2 sets - 10 reps - Sit to Stand with Counter Support  - 3 x daily - 7 x weekly - 1-2 sets - 10 reps - Standing Tandem Balance with Counter Support  - 3 x daily - 7 x weekly - 1 sets - 3 reps - 30 second hold - Standing Knee Flexion with Counter Support  - 3 x daily - 7 x weekly - 2 sets - 10 reps   Date: 06/05/2022 Prepared by: Josue Hector Exercises - Supine Quad Set  - 3 x daily - 7 x weekly - 2 sets - 10 reps - 5 second hold - Supine Active Straight Leg Raise  - 3 x daily - 7 x weekly - 2 sets - 10 reps - Supine Heel Slide  - 3 x daily - 7 x weekly - 2 sets - 10 reps - 5 second hold -  Supine Bridge  - 3 x daily - 7 x weekly - 2 sets - 10 reps  ASSESSMENT:  CLINICAL IMPRESSION: Continued with aggressive ROM for regaining last degrees of knee AROM. Able to get RT knee passively to 120 deg of flexion and 0 to -1 deg of knee extension. Patient struggles to achieve this ROM on his own despite good improvement in LE strength (ie increasing hamstring curl resistance but no more active knee flexion as a result). At this point, patient has neared plateau in therapy and will plan to review final HEP for DC next visit.   OBJECTIVE IMPAIRMENTS: Abnormal gait, decreased activity tolerance, decreased mobility, difficulty walking, decreased ROM, decreased strength, hypomobility, increased edema, impaired flexibility, improper body mechanics, and pain.   ACTIVITY LIMITATIONS: carrying, lifting, bending, sitting, standing, squatting, sleeping, stairs, transfers, bed mobility, and locomotion level  PARTICIPATION LIMITATIONS: meal prep, cleaning, laundry, driving, shopping, community activity, occupation, and yard work  PERSONAL FACTORS:  NA  are also affecting patient's functional outcome.   REHAB POTENTIAL: Good  CLINICAL DECISION MAKING: Stable/uncomplicated  EVALUATION COMPLEXITY: Low   GOALS: SHORT TERM GOALS: Target date: 06/26/2022  Patient will be independent with initial HEP and self-management strategies to improve functional outcomes Baseline:  Goal status: MET   LONG TERM GOALS: Target date: 07/17/2022  Patient will be independent with advanced HEP and self-management strategies to improve functional outcomes Baseline:  Goal status: IN PROGRESS  2.  Patient will improve FOTO score to predicted value to indicate improvement in functional outcomes Baseline: 55% function  Goal status: IN PROGRESS  3.  Patient will have RT knee AROM 0-120 degrees to improve functional mobility and facilitate squatting to pick up items from floor. Baseline: -2 to 110 deg Goal status: IN  PROGRESS  4. Patient will have equal to or > 4+/5 MMT throughout BLE to improve ability to perform functional mobility, stair ambulation and ADLs.  Baseline: See MMT Goal status: MET  5. Patient will be able to ambulate at least 350 feet during 2MWT with LRAD to demonstrate improved ability to perform functional mobility and associated tasks. Baseline: 280 feet with no AD  Goal status: IN PROGRESS  PLAN:  PT FREQUENCY: 2x/week  PT DURATION: 4 weeks  PLANNED INTERVENTIONS: Therapeutic exercises, Therapeutic activity, Neuromuscular re-education, Balance training, Gait training, Patient/Family education, Joint manipulation, Joint mobilization, Stair training, Aquatic Therapy, Dry Needling, Electrical stimulation, Spinal manipulation, Spinal mobilization, Cryotherapy, Moist heat, scar mobilization, Taping, Traction, Ultrasound, Biofeedback, Ionotophoresis '4mg'$ /ml Dexamethasone, and Manual therapy.   PLAN FOR NEXT SESSION: Reassess. Plan to review final HEP for DC next visit  11:23 AM, 07/28/22 Josue Hector PT DPT  Physical Therapist with [facility]  [phone]

## 2022-07-31 ENCOUNTER — Ambulatory Visit (HOSPITAL_COMMUNITY): Payer: Medicare Other | Admitting: Physical Therapy

## 2022-07-31 ENCOUNTER — Encounter (HOSPITAL_COMMUNITY): Payer: Self-pay | Admitting: Physical Therapy

## 2022-07-31 DIAGNOSIS — M25561 Pain in right knee: Secondary | ICD-10-CM | POA: Diagnosis not present

## 2022-07-31 DIAGNOSIS — R2689 Other abnormalities of gait and mobility: Secondary | ICD-10-CM

## 2022-07-31 DIAGNOSIS — M25661 Stiffness of right knee, not elsewhere classified: Secondary | ICD-10-CM

## 2022-07-31 NOTE — Therapy (Signed)
OUTPATIENT PHYSICAL THERAPY TREATMENT   Patient Name: Kyle Patel. MRN: [id number] DOB:11-Jun-1945, 77 y.o., male Today's Date: 07/31/2022   PHYSICAL THERAPY DISCHARGE SUMMARY  Visits from Start of Care: 17  Current functional level related to goals / functional outcomes: See below   Remaining deficits: See below   Education / Equipment: See assessment    Patient agrees to discharge. Patient goals were partially met. Patient is being discharged due to maximized rehab potential.   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 s/p NSTEMI in 03/2017 with 100% distal LAD stenosis treated with POBA as stent could not be delivered   Chronic kidney disease    COPD (chronic obstructive pulmonary disease) ([place])    Essential hypertension    Hereditary and idiopathic peripheral neuropathy 05/2014   Hyperlipidemia    Pulmonary embolism ([place]) 10/2011   Past Surgical History:  Procedure Laterality Date   ABDOMINAL SURGERY     colon polyp removed 2007- sepsis, coma for 4 months per pt   BIOPSY  12/24/2020   Procedure: BIOPSY;  Surgeon: Eloise Harman, DO;  [facility];  Service: Endoscopy;;   CATARACT EXTRACTION W/PHACO Right 08/19/2021   Procedure: CATARACT EXTRACTION PHACO AND INTRAOCULAR LENS PLACEMENT ([place]);  Surgeon: Baruch Goldmann, MD;  Location: [facility];  Service: Ophthalmology;  Laterality: Right;  CDE: 7.02    CATARACT EXTRACTION W/PHACO  Left 09/30/2021   Procedure: CATARACT EXTRACTION PHACO AND INTRAOCULAR LENS PLACEMENT ([facility]);  Surgeon: Baruch Goldmann, MD;  Location: [facility];  Service: Ophthalmology;  Laterality: Left;  CDE 4.61   COLONOSCOPY  03/22/2010   RMR: 1. Normal rectum 2. Diminutive polyp at the mouth of the ileocecal valve  status post cold biopsy removal. Remaideer of the colonic mucosa and terminal ileum mucosa appeared normal.    COLONOSCOPY N/A 06/25/2015   Procedure: COLONOSCOPY;  Surgeon: Daneil Dolin, MD;  [facility];  Service: Endoscopy;  Laterality: N/A;  200-rescheduled 1/30 per G[place]    COLONOSCOPY WITH PROPOFOL N/A 12/24/2020   Procedure: COLONOSCOPY WITH PROPOFOL;  Surgeon: Eloise Harman, DO;  [facility];  Service: Endoscopy;  Laterality: N/A;  ASA III / 8:00   CORONARY BALLOON ANGIOPLASTY N/A 04/24/2017   Procedure: CORONARY BALLOON ANGIOPLASTY;  Surgeon: Jettie Booze, MD;  Location: [facility];  Service: Cardiovascular;  Laterality: N/A;   LEFT HEART CATH AND CORONARY ANGIOGRAPHY N/A 04/24/2017   Procedure: LEFT HEART CATH AND CORONARY ANGIOGRAPHY;  Surgeon: Jettie Booze, MD;  Location: [facility];  Service: Cardiovascular;  Laterality: N/A;   LUMBAR LAMINECTOMY  06/02/2012   LUMBAR LAMINECTOMY/DECOMPRESSION MICRODISCECTOMY  06/02/2012   Procedure: LUMBAR LAMINECTOMY/DECOMPRESSION MICRODISCECTOMY 1 LEVEL;  Surgeon: Stann Mainland  Andres Shad, MD;  Location: [facility];  Service: Neurosurgery;  Laterality: Bilateral;  Bilateral Lumbar five-sacral one Foraminotomy and microdiskectomy    POLYPECTOMY  12/24/2020   Procedure: POLYPECTOMY;  Surgeon: Eloise Harman, DO;  [facility];  Service: Endoscopy;;   TOTAL KNEE ARTHROPLASTY Right 05/16/2022   Procedure: TOTAL KNEE ARTHROPLASTY;  Surgeon: Earlie Server, MD;  Location: [facility];  Service: Orthopedics;  Laterality: Right;   Patient Active Problem List   Diagnosis Date Noted   Type 2 diabetes  mellitus with stage 3b chronic kidney disease, without long-term current use of insulin ([place]) 05/29/2022   Hospital discharge follow-up 05/29/2022   Hyperkalemia 05/19/2022   Acute respiratory failure with hypoxia ([place]) 05/18/2022   S/P total knee arthroplasty, right 05/18/2022   COPD with acute exacerbation ([place]) 05/18/2022   Primary localized osteoarthritis of right knee 05/16/2022   Encounter for general adult medical examination with abnormal findings 05/12/2022   Preop examination 02/21/2022   Leg swelling 02/21/2022   CAD (coronary artery disease) 09/02/2021   Prediabetes 06/15/2020   Hyperlipidemia 04/27/2017   NSTEMI (non-ST elevated myocardial infarction) ([place]) 04/24/2017   History of colonic polyps    Diverticulosis of colon without hemorrhage    Family history of colon cancer 06/11/2015   Chronic foot pain 03/08/2015   Idiopathic peripheral neuropathy 05/31/2014   Lumbar radiculopathy 11/21/2013   Pain in joint, shoulder region 07/21/2012   Nocturnal muscle cramps 01/19/2012   Hypertension 11/10/2011   CKD (chronic kidney disease) 11/10/2011   Osteoarthritis of knee 09/16/2011    PCP: Ihor Dow MD  REFERRING PROVIDER: Earlie Server, MD  REFERRING DIAG: right total knee replacement  THERAPY DIAG:  Right knee pain, unspecified chronicity  Stiffness of right knee, not elsewhere classified  Other abnormalities of gait and mobility  Rationale for Evaluation and Treatment: Rehabilitation  ONSET DATE: 05/16/22  SUBJECTIVE:   SUBJECTIVE STATEMENT: He was very sore after last visit.   PERTINENT HISTORY: RT TKA  05/16/22  PAIN:  Are you having pain? No  PRECAUTIONS: None  WEIGHT BEARING RESTRICTIONS: No  FALLS:  Has patient fallen in last 6 months? No  LIVING ENVIRONMENT: Lives with: lives with their family and lives with their spouse Lives in: House/apartment Stairs: Yes: External: 2 steps; none Has following equipment at home: Single point  cane and Walker - 2 wheeled  OCCUPATION: Retired, part time driver   PLOF: Independent  PATIENT GOALS: To get better   NEXT MD VISIT:   OBJECTIVE:   DIAGNOSTIC FINDINGS: NA  PATIENT SURVEYS:  FOTO 60% (was 47% function)   COGNITION: Overall cognitive status: Within functional limits for tasks assessed     SENSATION: WFL  EDEMA:  Minimal    LOWER EXTREMITY ROM:  Active ROM Right eval Left eval Right 06/10/22 Right 06/26/22 Right 07/03/22 Right 07/07/22 Right 07/31/22 Left 07/31/22  Hip flexion          Hip extension          Hip abduction          Hip adduction          Hip internal rotation          Hip external rotation          Knee flexion 92 115 105 113 115 110 112 112  Knee extension -5 0 -3 -3 -7 -2 (after heel prop) -3 0  Ankle dorsiflexion          Ankle plantarflexion  Ankle inversion          Ankle eversion           (Blank rows = not tested)  LOWER EXTREMITY MMT:  MMT Right eval Left eval Right  07/07/22 Right  07/31/22 Left 07/31/22  Hip flexion 4+ '5 5 5 5  '$ Hip extension    4 4+  Hip abduction    4 4  Hip adduction       Hip internal rotation       Hip external rotation       Knee flexion 4+ '5 5 5 5  '$ Knee extension 4 5 4+ 5 5  Ankle dorsiflexion '5 5 5 5 5  '$ Ankle plantarflexion       Ankle inversion       Ankle eversion        (Blank rows = not tested)   FUNCTIONAL TESTS:  2 minute walk test: 390 feet with no AD (was 280 feet with RW)   GAIT: Decreased stride, decreased stance on RLE, no AD   TODAY'S TREATMENT:                                                                                                                              DATE:  07/31/22 Reassess Rec bike 5 min lv 4 (seat 12) Knee drive 10 x 5" Slant board  3 x 30"  FOTO 2MWT MMT AROM  Manual RT knee flexion/ extension PROM  07/28/22 Rec bike 5 min lv 4 (seat 12)  Standing:  Knee Drive 2nd step 10 x 10" Hamstring stretch at 1st step 10 x 10"  TKE 4  plates 20 x 3"     Seated HS curl machine 6 plates 2 x 15  Leg press 4 plates 2 x 15    Machine walkouts 4 plates x 10   Manual RT knee flexion/ extension PROM in prone and supine with contract relax   RT knee AROM: -3 to 115 deg (passively stretched to 120 deg flexion)    PATIENT EDUCATION:  Education details: on eval findings, POC and HEP  Person educated: Patient Education method: Explanation Education comprehension: verbalized understanding  HOME EXERCISE PROGRAM: Access Code: [email or web address] URL: [email or web address]  07/31/22 Access Code: [email or web address] URL: [email or web address] Date: 07/31/2022 Prepared by: Josue Hector  Exercises - Supine Bridge  - 2-3 x daily - 7 x weekly - 2 sets - 10 reps - Supine Heel Slide with Strap  - 2-3 x daily - 7 x weekly - 2 sets - 10 reps - 5 second hold - Supine Knee Extension Mobilization with Weight  - 2-3 x daily - 7 x weekly - 1 sets - 1 reps - 5 -10 min hold - Prone Quadriceps Stretch with Strap  - 2-3 x daily - 7 x weekly - 1 sets - 10 reps - 10 second hold - Heel Raises with Counter Support  - 2-3 x daily - 5 x weekly -  2 sets - 10 reps - Sit to Stand with Counter Support  - 2-3 x daily - 5 x weekly - 1-2 sets - 10 reps - Standing Tandem Balance with Counter Support  - 2-3 x daily - 5 x weekly - 1 sets - 3 reps - 30 second hold - Standing Knee Flexion with Counter Support  - 2-3 x daily - 5 x weekly - 2 sets - 10 reps - Standing Hip Abduction with Counter Support  - 2-3 x daily - 5 x weekly - 2 sets - 10 reps - Standing Hip Extension with Counter Support  - 2-3 x daily - 5 x weekly - 2 sets - 10 reps  07/07/22 - Supine Heel Slide with Strap  - 5 x daily - 7 x weekly - 2 sets - 10 reps - 5 second hold - Supine Knee Extension Mobilization with Weight  - 5 x daily - 7 x weekly - 1 sets - 1 reps - 5 -10 min hold  06/19/22 - Supine Heel Slide with Strap  - 3 x daily - 7 x weekly - 2 sets - 10 reps - 5 second hold -  Heel Raises with Counter Support  - 3 x daily - 7 x weekly - 2 sets - 10 reps - Sit to Stand with Counter Support  - 3 x daily - 7 x weekly - 1-2 sets - 10 reps - Standing Tandem Balance with Counter Support  - 3 x daily - 7 x weekly - 1 sets - 3 reps - 30 second hold - Standing Knee Flexion with Counter Support  - 3 x daily - 7 x weekly - 2 sets - 10 reps   Date: 06/05/2022 Prepared by: Josue Hector Exercises - Supine Quad Set  - 3 x daily - 7 x weekly - 2 sets - 10 reps - 5 second hold - Supine Active Straight Leg Raise  - 3 x daily - 7 x weekly - 2 sets - 10 reps - Supine Heel Slide  - 3 x daily - 7 x weekly - 2 sets - 10 reps - 5 second hold - Supine Bridge  - 3 x daily - 7 x weekly - 2 sets - 10 reps  ASSESSMENT:  CLINICAL IMPRESSION: Patient is 11 weeks post op. He has plateau in knee AROM. Mobility has not improved much over the past 3 weeks despite increased efforts with manual, increased home stretching and progressive strengthening. Of note, his RT knee AROM is comparable to hie LT knee so it is likely that this is within a physiological normal range for him. Patient has shown significant improvement in gait mechanics and speed, as well as LE strength and balance. He is able to ambulate stairs ans is walking without an assistive device. At this point, patient will be DC form therapy due to max benefit reached. Discussed findings with patient. Reviewed HEP and answered all patient questions. Encouraged patient to follow up with therapy services with any further questions or concerns.   OBJECTIVE IMPAIRMENTS: Abnormal gait, decreased activity tolerance, decreased mobility, difficulty walking, decreased ROM, decreased strength, hypomobility, increased edema, impaired flexibility, improper body mechanics, and pain.   ACTIVITY LIMITATIONS: carrying, lifting, bending, sitting, standing, squatting, sleeping, stairs, transfers, bed mobility, and locomotion level  PARTICIPATION  LIMITATIONS: meal prep, cleaning, laundry, driving, shopping, community activity, occupation, and yard work  PERSONAL FACTORS:  NA  are also affecting patient's functional outcome.   REHAB POTENTIAL: Good  CLINICAL DECISION  MAKING: Stable/uncomplicated  EVALUATION COMPLEXITY: Low   GOALS: SHORT TERM GOALS: Target date: 06/26/2022  Patient will be independent with initial HEP and self-management strategies to improve functional outcomes Baseline:  Goal status: MET   LONG TERM GOALS: Target date: 07/17/2022  Patient will be independent with advanced HEP and self-management strategies to improve functional outcomes Baseline:  Goal status: MET  2.  Patient will improve FOTO score to predicted value to indicate improvement in functional outcomes Baseline: 60% function  Goal status: NOT MET  3.  Patient will have RT knee AROM 0-120 degrees to improve functional mobility and facilitate squatting to pick up items from floor. Baseline: -3 to 112 deg Goal status: NOT MET  4. Patient will have equal to or > 4+/5 MMT throughout BLE to improve ability to perform functional mobility, stair ambulation and ADLs.  Baseline: See MMT Goal status: Partially MET  5. Patient will be able to ambulate at least 350 feet during 2MWT with LRAD to demonstrate improved ability to perform functional mobility and associated tasks. Baseline: 390 feet with no AD  Goal status: MET  PLAN:  PT FREQUENCY: 2x/week  PT DURATION: 4 weeks  PLANNED INTERVENTIONS: Therapeutic exercises, Therapeutic activity, Neuromuscular re-education, Balance training, Gait training, Patient/Family education, Joint manipulation, Joint mobilization, Stair training, Aquatic Therapy, Dry Needling, Electrical stimulation, Spinal manipulation, Spinal mobilization, Cryotherapy, Moist heat, scar mobilization, Taping, Traction, Ultrasound, Biofeedback, Ionotophoresis '4mg'$ /ml Dexamethasone, and Manual therapy.   PLAN FOR NEXT SESSION: DC  to HEP   11:49 AM, 07/31/22 Josue Hector PT DPT  Physical Therapist with [facility]  [phone]

## 2022-08-01 ENCOUNTER — Other Ambulatory Visit: Payer: Self-pay | Admitting: Internal Medicine

## 2022-08-01 DIAGNOSIS — G609 Hereditary and idiopathic neuropathy, unspecified: Secondary | ICD-10-CM

## 2022-08-04 ENCOUNTER — Encounter (HOSPITAL_COMMUNITY): Payer: Medicare Other | Admitting: Physical Therapy

## 2022-08-06 ENCOUNTER — Encounter (HOSPITAL_COMMUNITY): Payer: Medicare Other | Admitting: Physical Therapy

## 2022-08-28 ENCOUNTER — Other Ambulatory Visit: Payer: Self-pay | Admitting: Cardiology

## 2022-09-11 ENCOUNTER — Ambulatory Visit: Payer: Medicare Other | Admitting: Internal Medicine

## 2022-09-11 ENCOUNTER — Encounter: Payer: Self-pay | Admitting: Internal Medicine

## 2022-09-11 VITALS — BP 148/82 | HR 70 | Ht 69.5 in | Wt 207.4 lb

## 2022-09-11 DIAGNOSIS — I25110 Atherosclerotic heart disease of native coronary artery with unstable angina pectoris: Secondary | ICD-10-CM | POA: Diagnosis not present

## 2022-09-11 DIAGNOSIS — N1832 Chronic kidney disease, stage 3b: Secondary | ICD-10-CM

## 2022-09-11 DIAGNOSIS — E1122 Type 2 diabetes mellitus with diabetic chronic kidney disease: Secondary | ICD-10-CM

## 2022-09-11 DIAGNOSIS — G609 Hereditary and idiopathic neuropathy, unspecified: Secondary | ICD-10-CM

## 2022-09-11 DIAGNOSIS — E782 Mixed hyperlipidemia: Secondary | ICD-10-CM

## 2022-09-11 DIAGNOSIS — I10 Essential (primary) hypertension: Secondary | ICD-10-CM | POA: Diagnosis not present

## 2022-09-11 DIAGNOSIS — J42 Unspecified chronic bronchitis: Secondary | ICD-10-CM

## 2022-09-11 LAB — POCT GLYCOSYLATED HEMOGLOBIN (HGB A1C): HbA1c, POC (controlled diabetic range): 5.9 % (ref 0.0–7.0)

## 2022-09-11 MED ORDER — HYDRALAZINE HCL 25 MG PO TABS
25.0000 mg | ORAL_TABLET | Freq: Two times a day (BID) | ORAL | 1 refills | Status: DC
Start: 2022-09-11 — End: 2022-11-18

## 2022-09-11 MED ORDER — ALBUTEROL SULFATE HFA 108 (90 BASE) MCG/ACT IN AERS
2.0000 | INHALATION_SPRAY | Freq: Four times a day (QID) | RESPIRATORY_TRACT | 3 refills | Status: AC | PRN
Start: 2022-09-11 — End: ?

## 2022-09-11 NOTE — Assessment & Plan Note (Addendum)
Lab Results  Component Value Date   HGBA1C 6.7 (H) 05/19/2022    Well controlled On Farxiga 5 mg QD Advised to follow diabetic diet On statin and ACEi F/u CMP and lipid panel Diabetic foot exam: Today Diabetic eye exam: Advised to follow up with Ophthalmology for diabetic eye exam

## 2022-09-11 NOTE — Assessment & Plan Note (Signed)
On Lyrica 200 mg BID Better controlled now 

## 2022-09-11 NOTE — Assessment & Plan Note (Signed)
Mild exertional dyspnea likely due to chronic bronchitis Remote smoking history Albuterol as needed for dyspnea

## 2022-09-11 NOTE — Progress Notes (Signed)
Established Patient Office Visit  Subjective:  Patient ID: Kyle Patel., male    DOB: 05-May-1946  Age: 77 y.o. MRN: [id number]  CC:  Chief Complaint  Patient presents with   Hypertension    Four month follow up     HPI Kyle Patel. is a 77 y.o. male with past medical history of CAD, HTN, CKD, peripheral neuropathy and HLD who presents for f/u of his chronic medical conditions.  HTN and CAD: He has had stent placed for history of NSTEMI in 2018.  He is on aspirin and statin currently. BP is ELEVATED TODAY. Takes medications regularly. Patient denies headache, dizziness, chest pain, or palpitations.  CKD stage IIIb: Followed by Dr. Wolfgang Phoenix.  His last BMP showed GFR of 35, which was lower compared to his baseline.  He currently denies any dysuria, hematuria, urinary hesitance or resistance.  Type II DM: He is currently on Farxiga for CKD.  His HbA1c was 5.9 today.  He denies polyuria or polyphagia currently.  He has history of uncontrolled peripheral neuropathy, for which he takes Lyrica 200 mg twice daily.  He has chronic burning pain in his feet and also complains of intermittent weakness of the LE, but have improved since increasing dose of Lyrica.  Denies any recent fall.  He reports mild, intermittent dyspnea, especially upon exertion.  He has remote smoking history.  He has history of bronchitis.  Denies any chronic cough currently.  Denies orthopnea or PND.  Denies overt leg swelling.   Past Medical History:  Diagnosis Date   Acute anterior wall MI 04/24/2017   Arthritis    AVN of femur    CAD (coronary artery disease)    a. 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   Hyperlipidemia    Pulmonary embolism 10/2011    Past Surgical History:   Procedure Laterality Date   ABDOMINAL SURGERY     colon polyp removed 2007- sepsis, coma for 4 months per pt   BIOPSY  12/24/2020   Procedure: BIOPSY;  Surgeon: Lanelle Bal, DO;  Location: [facility];  Service: Endoscopy;;   CATARACT EXTRACTION W/PHACO Right 08/19/2021   Procedure: CATARACT EXTRACTION PHACO AND INTRAOCULAR LENS PLACEMENT (IOC);  Surgeon: Fabio Pierce, MD;  Location: [facility];  Service: Ophthalmology;  Laterality: Right;  CDE: 7.02    CATARACT EXTRACTION W/PHACO Left 09/30/2021   Procedure: CATARACT EXTRACTION PHACO AND INTRAOCULAR LENS PLACEMENT (IOC);  Surgeon: Fabio Pierce, MD;  Location: [facility];  Service: Ophthalmology;  Laterality: Left;  CDE 4.61   COLONOSCOPY  03/22/2010   RMR: 1. Normal rectum 2. Diminutive polyp at the mouth of the ileocecal valve  status post cold biopsy removal. Remaideer of the colonic mucosa and terminal ileum mucosa appeared normal.    COLONOSCOPY N/A 06/25/2015   Procedure: COLONOSCOPY;  Surgeon: Corbin Ade, MD;  Location: [facility];  Service: Endoscopy;  Laterality: N/A;  200-rescheduled 1/30 per Ginger    COLONOSCOPY WITH PROPOFOL N/A 12/24/2020   Procedure: COLONOSCOPY WITH PROPOFOL;  Surgeon: Lanelle Bal, DO;  Location: [facility];  Service: Endoscopy;  Laterality: N/A;  ASA III / 8:00   CORONARY BALLOON ANGIOPLASTY N/A 04/24/2017   Procedure: CORONARY BALLOON ANGIOPLASTY;  Surgeon: Corky Crafts,  MD;  Location: [facility];  Service: Cardiovascular;  Laterality: N/A;   LEFT HEART CATH AND CORONARY ANGIOGRAPHY N/A 04/24/2017   Procedure: LEFT HEART CATH AND CORONARY ANGIOGRAPHY;  Surgeon: Corky Crafts, MD;  Location: [facility];  Service: Cardiovascular;  Laterality: N/A;   LUMBAR LAMINECTOMY  06/02/2012   LUMBAR LAMINECTOMY/DECOMPRESSION MICRODISCECTOMY  06/02/2012   Procedure: LUMBAR LAMINECTOMY/DECOMPRESSION MICRODISCECTOMY 1 LEVEL;  Surgeon: Karn Cassis, MD;  Location: [facility];   Service: Neurosurgery;  Laterality: Bilateral;  Bilateral Lumbar five-sacral one Foraminotomy and microdiskectomy    POLYPECTOMY  12/24/2020   Procedure: POLYPECTOMY;  Surgeon: Lanelle Bal, DO;  Location: [facility];  Service: Endoscopy;;   TOTAL KNEE ARTHROPLASTY Right 05/16/2022   Procedure: TOTAL KNEE ARTHROPLASTY;  Surgeon: Frederico Hamman, MD;  Location: [facility];  Service: Orthopedics;  Laterality: Right;    Family History  Problem Relation Age of Onset   Heart attack Mother    Diabetes Mother    Stroke Father    Heart attack Father    Arthritis Other    Colon cancer Sister 74       Estimated age of onset; just found out about it in 43.   Neuropathy Neg Hx     Social History   Socioeconomic History   Marital status: Married    Spouse name: Not on file   Number of children: 1   Years of education: Not on file   Highest education level: Not on file  Occupational History   Occupation: retired  Tobacco Use   Smoking status: Former    Packs/day: 0.10    Years: 20.00    Additional pack years: 0.00    Total pack years: 2.00    Types: Cigarettes    Quit date: 05/08/1999    Years since quitting: 23.3   Smokeless tobacco: Never  Vaping Use   Vaping Use: Never used  Substance and Sexual Activity   Alcohol use: No   Drug use: No   Sexual activity: Yes  Other Topics Concern   Not on file  Social History Narrative   Patient is right handed.   Patient drinks caffeine occasionally   Social Determinants of Health   Financial Resource Strain: Low Risk  (10/29/2021)   Overall Financial Resource Strain (CARDIA)    Difficulty of Paying Living Expenses: Not hard at all  Food Insecurity: Patient Declined (05/16/2022)   Hunger Vital Sign    Worried About Running Out of Food in the Last Year: Patient declined    Ran Out of Food in the Last Year: Patient declined  Transportation Needs: No Transportation Needs (05/16/2022)   PRAPARE - Scientist, research (physical sciences) (Medical): No    Lack of Transportation (Non-Medical): No  Physical Activity: Inactive (10/29/2021)   Exercise Vital Sign    Days of Exercise per Week: 0 days    Minutes of Exercise per Session: 0 min  Stress: No Stress Concern Present (10/29/2021)   [employer] of Occupational Health - Occupational Stress Questionnaire    Feeling of Stress : Not at all  Social Connections: Socially Integrated (10/29/2021)   Social Connection and Isolation Panel [NHANES]    Frequency of Communication with Friends and Family: More than three times a week    Frequency of Social Gatherings with Friends and Family: More than three times a week    Attends Religious Services: More than 4 times per year    Active Member  of Clubs or Organizations: Yes    Attends Banker Meetings: More than 4 times per year    Marital Status: Married  Catering manager Violence: Not At Risk (05/16/2022)   Humiliation, Afraid, Rape, and Kick questionnaire    Fear of Current or Ex-Partner: No    Emotionally Abused: No    Physically Abused: No    Sexually Abused: No    Outpatient Medications Prior to Visit  Medication Sig Dispense Refill   amLODipine (NORVASC) 10 MG tablet Take 1 tablet (10 mg total) by mouth daily. 90 tablet 3   aspirin 81 MG chewable tablet Chew 81 mg by mouth 2 (two) times daily.     calcitRIOL (ROCALTROL) 0.25 MCG capsule Take 0.25 mcg by mouth every Monday, Wednesday, and Friday.     Coenzyme Q10-Vitamin E 100-150 MG-UNIT CAPS Take 1 capsule by mouth at bedtime.     COLACE 100 MG capsule SMARTSIG:1-3 Capsule(s) By Mouth PRN     dapagliflozin propanediol (FARXIGA) 5 MG TABS tablet Take 5 mg by mouth See admin instructions. Take 5 mg by mouth on Monday's and Thursday's.     ezetimibe (ZETIA) 10 MG tablet TAKE (1) TABLET BY MOUTH ONCE DAILY. 90 tablet 1   furosemide (LASIX) 20 MG tablet TAKE 1 TABLET BY MOUTH ONCE DAILY. 90 tablet 2   lisinopril (ZESTRIL) 10 MG tablet Take 1 tablet  (10 mg total) by mouth daily.     metoprolol succinate (TOPROL-XL) 50 MG 24 hr tablet TAKE (1) TABLET BY MOUTH ONCE DAILY. TAKE WITH OR IMMEDIATELY FOLLOWING A MEAL. 90 tablet 2   Misc Natural Products (OSTEO BI-FLEX ADV JOINT SHIELD) TABS Take 1 tablet by mouth daily.     Multiple Vitamins-Minerals (CENTRUM PO) Take 1 tablet by mouth every morning.     nitroGLYCERIN (NITROSTAT) 0.4 MG SL tablet PLACE 1 TAB UNDER TONGUE EVERY 5 MIN IF NEEDED FOR CHEST PAIN. MAY USE 3 TIMES.NO RELIEF CALL 911. 25 tablet 3   oxyCODONE (OXY IR/ROXICODONE) 5 MG immediate release tablet Take 5 mg by mouth 4 (four) times daily as needed.     pantoprazole (PROTONIX) 40 MG tablet Take 1 tablet (40 mg total) by mouth daily. 30 tablet 0   pregabalin (LYRICA) 200 MG capsule Take 1 capsule (200 mg total) by mouth 2 (two) times daily. 60 capsule 5   rosuvastatin (CRESTOR) 10 MG tablet TAKE (1) TABLET BY MOUTH ONCE DAILY. 90 tablet 0   sodium bicarbonate 650 MG tablet Take 650 mg by mouth 3 (three) times daily.     No facility-administered medications prior to visit.    Allergies  Allergen Reactions   Levaquin [Levofloxacin] Itching   Statins     Myalgias    ROS Review of Systems  Constitutional:  Negative for chills and fever.  HENT:  Negative for congestion and sore throat.   Eyes:  Negative for pain and discharge.  Respiratory:  Positive for shortness of breath (Intermittent, upon exertion). Negative for cough.   Cardiovascular:  Negative for chest pain and palpitations.  Gastrointestinal:  Negative for diarrhea, nausea and vomiting.  Endocrine: Negative for polydipsia and polyuria.  Genitourinary:  Negative for dysuria and hematuria.  Musculoskeletal:  Negative for neck pain and neck stiffness.  Skin:  Negative for rash.  Neurological:  Positive for weakness and numbness. Negative for dizziness and headaches.  Psychiatric/Behavioral:  Negative for agitation and behavioral problems.       Objective:     Physical Exam Vitals  reviewed.  Constitutional:      General: He is not in acute distress.    Appearance: He is not diaphoretic.  HENT:     Head: Normocephalic and atraumatic.     Nose: Nose normal.     Mouth/Throat:     Mouth: Mucous membranes are moist.  Eyes:     General: No scleral icterus.    Extraocular Movements: Extraocular movements intact.  Cardiovascular:     Rate and Rhythm: Normal rate and regular rhythm.     Pulses: Normal pulses.     Heart sounds: Normal heart sounds. No murmur heard. Pulmonary:     Breath sounds: Normal breath sounds. No wheezing or rales.  Musculoskeletal:     Cervical back: Neck supple. No tenderness.     Right lower leg: No edema.     Left lower leg: No edema.  Skin:    General: Skin is warm.     Findings: No rash.  Neurological:     General: No focal deficit present.     Mental Status: He is alert and oriented to person, place, and time.     Sensory: Sensory deficit (B/l feet) present.     Motor: Weakness (4/5 in b/l LE) present.  Psychiatric:        Mood and Affect: Mood normal.        Behavior: Behavior normal.     BP (!) 148/82 (BP Location: Right Arm)   Pulse 70   Ht 5' 9.5" (1.765 m)   Wt 207 lb 6.4 oz (94.1 kg)   SpO2 (!) 88%   BMI 30.19 kg/m  Wt Readings from Last 3 Encounters:  09/11/22 207 lb 6.4 oz (94.1 kg)  07/17/22 205 lb 6.4 oz (93.2 kg)  05/29/22 199 lb 6.4 oz (90.4 kg)    Lab Results  Component Value Date   TSH 1.63 08/08/2019   Lab Results  Component Value Date   WBC 12.2 (H) 05/19/2022   HGB 12.6 (L) 05/19/2022   HCT 38.9 (L) 05/19/2022   MCV 91.5 05/19/2022   PLT 145 (L) 05/19/2022   Lab Results  Component Value Date   NA 137 05/19/2022   K 5.4 (H) 05/19/2022   CO2 21 (L) 05/19/2022   GLUCOSE 181 (H) 05/19/2022   BUN 40 (H) 05/19/2022   CREATININE 1.98 (H) 05/19/2022   BILITOT 0.6 05/18/2022   ALKPHOS 54 05/18/2022   AST 30 05/18/2022   ALT 12 05/18/2022   PROT 7.6 05/18/2022   ALBUMIN  3.5 05/18/2022   CALCIUM 8.8 (L) 05/19/2022   ANIONGAP 6 05/19/2022   Lab Results  Component Value Date   CHOL 148 07/25/2022   Lab Results  Component Value Date   HDL 50 07/25/2022   Lab Results  Component Value Date   LDLCALC 86 07/25/2022   Lab Results  Component Value Date   TRIG 59 07/25/2022   Lab Results  Component Value Date   CHOLHDL 3.0 07/25/2022   Lab Results  Component Value Date   HGBA1C 5.9 09/11/2022      Assessment & Plan:   Problem List Items Addressed This Visit       Cardiovascular and Mediastinum   Hypertension    BP Readings from Last 1 Encounters:  09/11/22 (!) 148/82  Uncontrolled with lisinopril, amlodipine and metoprolol Will avoid increasing dose of lisinopril due to CKD and avoid increasing dose of metoprolol due to well controlled HR Added hydralazine 25 mg BID Counseled for compliance with  the medications Advised DASH diet and moderate exercise/walking as tolerated      Relevant Medications   hydrALAZINE (APRESOLINE) 25 MG tablet   CAD (coronary artery disease)    S/p stent placement On aspirin and statin On Metoprolol Denies chest pain or dyspnea currently      Relevant Medications   hydrALAZINE (APRESOLINE) 25 MG tablet     Respiratory   Chronic bronchitis    Mild exertional dyspnea likely due to chronic bronchitis Remote smoking history Albuterol as needed for dyspnea      Relevant Medications   albuterol (VENTOLIN HFA) 108 (90 Base) MCG/ACT inhaler     Endocrine   Type 2 diabetes mellitus with stage 3b chronic kidney disease, without long-term current use of insulin - Primary    Lab Results  Component Value Date   HGBA1C 6.7 (H) 05/19/2022    Well controlled On Farxiga 5 mg QD Advised to follow diabetic diet On statin and ACEi F/u CMP and lipid panel Diabetic foot exam: Today Diabetic eye exam: Advised to follow up with Ophthalmology for diabetic eye exam      Relevant Orders   POCT glycosylated  hemoglobin (Hb A1C) (Completed)     Nervous and Auditory   Idiopathic peripheral neuropathy    On Lyrica 200 mg BID Better controlled now        Genitourinary   CKD (chronic kidney disease)    Last BMP reviewed Followed by nephrologist On sodium bicarb, ACEi and Lasix On Farxiga for proteinuria        Other   Hyperlipidemia (Chronic)    On statin and Zetia currently      Relevant Medications   hydrALAZINE (APRESOLINE) 25 MG tablet   Meds ordered this encounter  Medications   albuterol (VENTOLIN HFA) 108 (90 Base) MCG/ACT inhaler    Sig: Inhale 2 puffs into the lungs every 6 (six) hours as needed for wheezing or shortness of breath.    Dispense:  18 g    Refill:  3    Okay to substitute to generic/formulary Albuterol.   hydrALAZINE (APRESOLINE) 25 MG tablet    Sig: Take 1 tablet (25 mg total) by mouth 2 (two) times daily.    Dispense:  60 tablet    Refill:  1    Follow-up: Return in about 2 months (around 11/11/2022) for HTN.    Anabel Halon, MD

## 2022-09-11 NOTE — Patient Instructions (Signed)
Please start taking Hydralazine 25 mg twice daily.  Please continue to take other medications as prescribed.  Please continue to follow low carb diet and perform moderate exercise/walking at least 150 mins/week.

## 2022-09-11 NOTE — Assessment & Plan Note (Signed)
S/p stent placement On aspirin and statin On Metoprolol Denies chest pain or dyspnea currently 

## 2022-09-11 NOTE — Assessment & Plan Note (Addendum)
Last BMP reviewed Followed by nephrologist On sodium bicarb, ACEi and Lasix On Farxiga for proteinuria

## 2022-09-11 NOTE — Assessment & Plan Note (Signed)
On statin and Zetia currently ?

## 2022-09-11 NOTE — Assessment & Plan Note (Addendum)
BP Readings from Last 1 Encounters:  09/11/22 (!) 148/82   Uncontrolled with lisinopril, amlodipine and metoprolol Will avoid increasing dose of lisinopril due to CKD and avoid increasing dose of metoprolol due to well controlled HR Added hydralazine 25 mg BID Counseled for compliance with the medications Advised DASH diet and moderate exercise/walking as tolerated

## 2022-09-19 ENCOUNTER — Other Ambulatory Visit: Payer: Self-pay | Admitting: Cardiology

## 2022-10-01 ENCOUNTER — Other Ambulatory Visit: Payer: Self-pay | Admitting: Cardiology

## 2022-11-11 ENCOUNTER — Ambulatory Visit: Payer: Medicare Other | Admitting: Internal Medicine

## 2022-11-14 ENCOUNTER — Ambulatory Visit: Payer: Medicare Other | Admitting: Internal Medicine

## 2022-11-18 ENCOUNTER — Encounter: Payer: Self-pay | Admitting: Internal Medicine

## 2022-11-18 ENCOUNTER — Ambulatory Visit: Payer: Medicare Other | Admitting: Internal Medicine

## 2022-11-18 VITALS — BP 122/60 | HR 64 | Ht 69.5 in | Wt 206.0 lb

## 2022-11-18 DIAGNOSIS — E1122 Type 2 diabetes mellitus with diabetic chronic kidney disease: Secondary | ICD-10-CM | POA: Diagnosis not present

## 2022-11-18 DIAGNOSIS — N1832 Chronic kidney disease, stage 3b: Secondary | ICD-10-CM

## 2022-11-18 DIAGNOSIS — I10 Essential (primary) hypertension: Secondary | ICD-10-CM | POA: Diagnosis not present

## 2022-11-18 MED ORDER — HYDRALAZINE HCL 25 MG PO TABS: 25.0000 mg | ORAL_TABLET | Freq: Two times a day (BID) | ORAL | 1 refills | Status: DC

## 2022-11-18 NOTE — Assessment & Plan Note (Signed)
BP Readings from Last 1 Encounters:  11/18/22 122/60   Well-controlled with lisinopril, amlodipine and metoprolol, recently added hydralazine 25 mg BID Will avoid increasing dose of lisinopril due to CKD and avoid increasing dose of metoprolol due to well controlled HR Counseled for compliance with the medications Advised DASH diet and moderate exercise/walking as tolerated

## 2022-11-18 NOTE — Patient Instructions (Addendum)
Please continue to take medications as prescribed.  Please continue to follow low salt diet and perform moderate exercise/walking at least 150 mins/week.  Please perform leg elevation for leg swelling. Okay to wear compression socks for leg swelling.  Please consider getting Shingrix and Tdap vaccine at local pharmacy.

## 2022-11-18 NOTE — Progress Notes (Signed)
Established Patient Office Visit  Subjective:  Patient ID: Kyle Lucena., male    DOB: 05-Jan-1946  Age: 77 y.o. MRN: [id number]  CC:  Chief Complaint  Patient presents with   Hypertension    HPI Kyle Patel. is a 77 y.o. male with past medical history of CAD, HTN, CKD, peripheral neuropathy and HLD who presents for f/u of his chronic medical conditions.  HTN and CAD: He has had stent placed for history of NSTEMI in 2018.  He is on aspirin and statin currently. BP is wnl today.  He is tolerating recently added hydralazine 25 mg twice daily.  Takes medications regularly. Patient denies headache, dizziness, chest pain, or palpitations.   CKD stage IIIb: Followed by Dr. Wolfgang Patel.  His last BMP showed GFR of 35, which was lower compared to his baseline.  He currently denies any dysuria, hematuria, urinary hesitance or resistance.   Type II DM: He is currently on Farxiga for CKD.  His HbA1c was 5.9 in 04/24.  He denies polyuria or polyphagia currently.  He has history of peripheral neuropathy, for which he takes Lyrica 200 mg twice daily.  He has chronic burning pain in his feet and also complains of intermittent weakness of the LE, but have improved since increasing dose of Lyrica.  Denies any recent fall.   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   Hyperlipidemia    Pulmonary embolism (HCC) 10/2011    Past Surgical History:  Procedure Laterality Date   ABDOMINAL SURGERY     colon polyp removed 2007- sepsis, coma for 4 months per pt   BIOPSY  12/24/2020   Procedure: BIOPSY;  Surgeon: Lanelle Bal, DO;  Location: [facility];  Service: Endoscopy;;   CATARACT EXTRACTION W/PHACO Right 08/19/2021   Procedure: CATARACT EXTRACTION PHACO AND INTRAOCULAR LENS PLACEMENT (IOC);  Surgeon: Fabio Pierce, MD;  Location: [facility];  Service: Ophthalmology;  Laterality: Right;  CDE: 7.02    CATARACT EXTRACTION W/PHACO Left 09/30/2021   Procedure: CATARACT EXTRACTION PHACO AND INTRAOCULAR LENS PLACEMENT (IOC);  Surgeon: Fabio Pierce, MD;  Location: [facility];  Service: Ophthalmology;  Laterality: Left;  CDE 4.61   COLONOSCOPY  03/22/2010   RMR: 1. Normal rectum 2. Diminutive polyp at the mouth of the ileocecal valve  status post cold biopsy removal. Remaideer of the colonic mucosa and terminal ileum mucosa appeared normal.    COLONOSCOPY N/A 06/25/2015   Procedure: COLONOSCOPY;  Surgeon: Corbin Ade, MD;  Location: [facility];  Service: Endoscopy;  Laterality: N/A;  200-rescheduled 1/30 per Ginger    COLONOSCOPY WITH PROPOFOL N/A 12/24/2020   Procedure: COLONOSCOPY WITH PROPOFOL;  Surgeon: Lanelle Bal, DO;  Location: [facility];  Service: Endoscopy;  Laterality: N/A;  ASA III / 8:00   CORONARY BALLOON ANGIOPLASTY N/A 04/24/2017   Procedure: CORONARY BALLOON ANGIOPLASTY;  Surgeon: Corky Crafts, MD;  Location: [facility];  Service: Cardiovascular;  Laterality: N/A;   LEFT HEART CATH AND CORONARY ANGIOGRAPHY N/A 04/24/2017   Procedure: LEFT  HEART CATH AND CORONARY ANGIOGRAPHY;  Surgeon: Corky Crafts, MD;  Location: [facility];  Service: Cardiovascular;  Laterality: N/A;   LUMBAR LAMINECTOMY  06/02/2012   LUMBAR LAMINECTOMY/DECOMPRESSION MICRODISCECTOMY  06/02/2012   Procedure: LUMBAR LAMINECTOMY/DECOMPRESSION MICRODISCECTOMY 1 LEVEL;  Surgeon: Karn Cassis, MD;  Location: [facility];  Service: Neurosurgery;  Laterality: Bilateral;  Bilateral Lumbar five-sacral one Foraminotomy and microdiskectomy    POLYPECTOMY  12/24/2020   Procedure: POLYPECTOMY;  Surgeon:  Lanelle Bal, DO;  Location: [facility];  Service: Endoscopy;;   TOTAL KNEE ARTHROPLASTY Right 05/16/2022   Procedure: TOTAL KNEE ARTHROPLASTY;  Surgeon: Frederico Hamman, MD;  Location: [facility];  Service: Orthopedics;  Laterality: Right;    Family History  Problem Relation Age of Onset   Heart attack Mother    Diabetes Mother    Stroke Father    Heart attack Father    Arthritis Other    Colon cancer Sister 45       Estimated age of onset; just found out about it in 84.   Neuropathy Neg Hx     Social History   Socioeconomic History   Marital status: Married    Spouse name: Not on file   Number of children: 1   Years of education: Not on file   Highest education level: Not on file  Occupational History   Occupation: retired  Tobacco Use   Smoking status: Former    Packs/day: 0.10    Years: 20.00    Additional pack years: 0.00    Total pack years: 2.00    Types: Cigarettes    Quit date: 05/08/1999    Years since quitting: 23.5   Smokeless tobacco: Never  Vaping Use   Vaping Use: Never used  Substance and Sexual Activity   Alcohol use: No   Drug use: No   Sexual activity: Yes  Other Topics Concern   Not on file  Social History Narrative   Patient is right handed.   Patient drinks caffeine occasionally   Social Determinants of Health   Financial Resource Strain: Low Risk  (10/29/2021)   Overall Financial Resource Strain (CARDIA)    Difficulty of Paying Living Expenses: Not hard at all  Food Insecurity: Patient Declined (05/16/2022)   Hunger Vital Sign    Worried About Running Out of Food in the Last Year: Patient declined    Ran Out of Food in the Last Year: Patient declined  Transportation Needs: No Transportation Needs (05/16/2022)   PRAPARE - Administrator, Civil Service (Medical): No    Lack of Transportation (Non-Medical): No  Physical Activity: Inactive (10/29/2021)   Exercise Vital Sign    Days of Exercise per Week: 0 days    Minutes  of Exercise per Session: 0 min  Stress: No Stress Concern Present (10/29/2021)   [employer] of Occupational Health - Occupational Stress Questionnaire    Feeling of Stress : Not at all  Social Connections: Socially Integrated (10/29/2021)   Social Connection and Isolation Panel [NHANES]    Frequency of Communication with Friends and Family: More than three times a week    Frequency of Social Gatherings with Friends and Family: More than three times a week    Attends Religious Services: More than 4 times per year    Active Member of [employer] or Organizations: Yes    Attends Engineer, structural: More than 4 times per year    Marital Status: Married  Intimate  Partner Violence: Not At Risk (05/16/2022)   Humiliation, Afraid, Rape, and Kick questionnaire    Fear of Current or Ex-Partner: No    Emotionally Abused: No    Physically Abused: No    Sexually Abused: No    Outpatient Medications Prior to Visit  Medication Sig Dispense Refill   albuterol (VENTOLIN HFA) 108 (90 Base) MCG/ACT inhaler Inhale 2 puffs into the lungs every 6 (six) hours as needed for wheezing or shortness of breath. 18 g 3   amLODipine (NORVASC) 10 MG tablet Take 1 tablet (10 mg total) by mouth daily. 90 tablet 3   aspirin 81 MG chewable tablet Chew 81 mg by mouth 2 (two) times daily.     calcitRIOL (ROCALTROL) 0.25 MCG capsule Take 0.25 mcg by mouth every Monday, Wednesday, and Friday.     Coenzyme Q10-Vitamin E 100-150 MG-UNIT CAPS Take 1 capsule by mouth at bedtime.     COLACE 100 MG capsule SMARTSIG:1-3 Capsule(s) By Mouth PRN     dapagliflozin propanediol (FARXIGA) 5 MG TABS tablet Take 5 mg by mouth See admin instructions. Take 5 mg by mouth on Monday's and Thursday's.     ezetimibe (ZETIA) 10 MG tablet TAKE (1) TABLET BY MOUTH ONCE DAILY. 90 tablet 1   furosemide (LASIX) 20 MG tablet TAKE 1 TABLET BY MOUTH ONCE DAILY. 90 tablet 2   lisinopril (ZESTRIL) 10 MG tablet Take 1 tablet (10 mg total) by mouth  daily.     metoprolol succinate (TOPROL-XL) 50 MG 24 hr tablet TAKE (1) TABLET BY MOUTH ONCE DAILY. TAKE WITH OR IMMEDIATELY FOLLOWING A MEAL. 90 tablet 2   Misc Natural Products (OSTEO BI-FLEX ADV JOINT SHIELD) TABS Take 1 tablet by mouth daily.     Multiple Vitamins-Minerals (CENTRUM PO) Take 1 tablet by mouth every morning.     nitroGLYCERIN (NITROSTAT) 0.4 MG SL tablet PLACE 1 TAB UNDER TONGUE EVERY 5 MIN IF NEEDED FOR CHEST PAIN. MAY USE 3 TIMES.NO RELIEF CALL 911. 25 tablet 3   oxyCODONE (OXY IR/ROXICODONE) 5 MG immediate release tablet Take 5 mg by mouth 4 (four) times daily as needed.     pantoprazole (PROTONIX) 40 MG tablet Take 1 tablet (40 mg total) by mouth daily. 30 tablet 0   pregabalin (LYRICA) 200 MG capsule Take 1 capsule (200 mg total) by mouth 2 (two) times daily. 60 capsule 5   rosuvastatin (CRESTOR) 10 MG tablet TAKE (1) TABLET BY MOUTH ONCE DAILY. 90 tablet 1   sodium bicarbonate 650 MG tablet Take 650 mg by mouth 3 (three) times daily.     hydrALAZINE (APRESOLINE) 25 MG tablet Take 1 tablet (25 mg total) by mouth 2 (two) times daily. 60 tablet 1   No facility-administered medications prior to visit.    Allergies  Allergen Reactions   Levaquin [Levofloxacin] Itching   Statins     Myalgias    ROS Review of Systems  Constitutional:  Negative for chills and fever.  HENT:  Negative for congestion and sore throat.   Eyes:  Negative for pain and discharge.  Respiratory:  Positive for shortness of breath (Intermittent, upon exertion). Negative for cough.   Cardiovascular:  Negative for chest pain and palpitations.  Gastrointestinal:  Negative for diarrhea, nausea and vomiting.  Endocrine: Negative for polydipsia and polyuria.  Genitourinary:  Negative for dysuria and hematuria.  Musculoskeletal:  Negative for neck pain and neck stiffness.  Skin:  Negative for rash.  Neurological:  Positive for weakness and numbness. Negative for  dizziness and headaches.   Psychiatric/Behavioral:  Negative for agitation and behavioral problems.       Objective:    Physical Exam Vitals reviewed.  Constitutional:      General: He is not in acute distress.    Appearance: He is not diaphoretic.  HENT:     Head: Normocephalic and atraumatic.     Nose: Nose normal.     Mouth/Throat:     Mouth: Mucous membranes are moist.  Eyes:     General: No scleral icterus.    Extraocular Movements: Extraocular movements intact.  Cardiovascular:     Rate and Rhythm: Normal rate and regular rhythm.     Pulses: Normal pulses.     Heart sounds: Normal heart sounds. No murmur heard. Pulmonary:     Breath sounds: Normal breath sounds. No wheezing or rales.  Musculoskeletal:     Cervical back: Neck supple. No tenderness.     Right lower leg: No edema.     Left lower leg: No edema.  Skin:    General: Skin is warm.     Findings: No rash.  Neurological:     General: No focal deficit present.     Mental Status: He is alert and oriented to person, place, and time.     Sensory: Sensory deficit (B/l feet) present.     Motor: Weakness (4/5 in b/l LE) present.     Gait: Gait abnormal (slow).  Psychiatric:        Mood and Affect: Mood normal.        Behavior: Behavior normal.     BP 122/60 (BP Location: Right Arm)   Pulse 64   Ht 5' 9.5" (1.765 m)   Wt 206 lb (93.4 kg)   SpO2 91%   BMI 29.98 kg/m  Wt Readings from Last 3 Encounters:  11/18/22 206 lb (93.4 kg)  09/11/22 207 lb 6.4 oz (94.1 kg)  07/17/22 205 lb 6.4 oz (93.2 kg)    Lab Results  Component Value Date   TSH 1.63 08/08/2019   Lab Results  Component Value Date   WBC 12.2 (H) 05/19/2022   HGB 12.6 (L) 05/19/2022   HCT 38.9 (L) 05/19/2022   MCV 91.5 05/19/2022   PLT 145 (L) 05/19/2022   Lab Results  Component Value Date   NA 137 05/19/2022   K 5.4 (H) 05/19/2022   CO2 21 (L) 05/19/2022   GLUCOSE 181 (H) 05/19/2022   BUN 40 (H) 05/19/2022   CREATININE 1.98 (H) 05/19/2022   BILITOT 0.6  05/18/2022   ALKPHOS 54 05/18/2022   AST 30 05/18/2022   ALT 12 05/18/2022   PROT 7.6 05/18/2022   ALBUMIN 3.5 05/18/2022   CALCIUM 8.8 (L) 05/19/2022   ANIONGAP 6 05/19/2022   Lab Results  Component Value Date   CHOL 148 07/25/2022   Lab Results  Component Value Date   HDL 50 07/25/2022   Lab Results  Component Value Date   LDLCALC 86 07/25/2022   Lab Results  Component Value Date   TRIG 59 07/25/2022   Lab Results  Component Value Date   CHOLHDL 3.0 07/25/2022   Lab Results  Component Value Date   HGBA1C 5.9 09/11/2022      Assessment & Plan:   Problem List Items Addressed This Visit       Cardiovascular and Mediastinum   Hypertension - Primary    BP Readings from Last 1 Encounters:  11/18/22 122/60  Well-controlled with lisinopril, amlodipine and metoprolol,  recently added hydralazine 25 mg BID Will avoid increasing dose of lisinopril due to CKD and avoid increasing dose of metoprolol due to well controlled HR Counseled for compliance with the medications Advised DASH diet and moderate exercise/walking as tolerated      Relevant Medications   hydrALAZINE (APRESOLINE) 25 MG tablet     Endocrine   Type 2 diabetes mellitus with stage 3b chronic kidney disease, without long-term current use of insulin (HCC)    Lab Results  Component Value Date   HGBA1C 5.9 09/11/2022    Well controlled On Farxiga 5 mg QD Advised to follow diabetic diet On statin and ACEi F/u CMP and lipid panel Diabetic foot exam: Today Diabetic eye exam: Advised to follow up with Ophthalmology for diabetic eye exam        Genitourinary   CKD (chronic kidney disease)    Last BMP reviewed Followed by nephrologist On sodium bicarb, ACEi and Lasix On Farxiga for proteinuria       Meds ordered this encounter  Medications   hydrALAZINE (APRESOLINE) 25 MG tablet    Sig: Take 1 tablet (25 mg total) by mouth 2 (two) times daily.    Dispense:  180 tablet    Refill:  1     Follow-up: Return in about 4 months (around 03/20/2023) for HTN and DM.    Anabel Halon, MD

## 2022-11-18 NOTE — Assessment & Plan Note (Signed)
Lab Results  Component Value Date   HGBA1C 5.9 09/11/2022    Well controlled On Farxiga 5 mg QD Advised to follow diabetic diet On statin and ACEi F/u CMP and lipid panel Diabetic foot exam: Today Diabetic eye exam: Advised to follow up with Ophthalmology for diabetic eye exam

## 2022-11-18 NOTE — Assessment & Plan Note (Signed)
Last BMP reviewed Followed by nephrologist On sodium bicarb, ACEi and Lasix On Farxiga for proteinuria 

## 2022-11-26 ENCOUNTER — Other Ambulatory Visit: Payer: Self-pay | Admitting: Physician Assistant

## 2022-11-26 NOTE — Telephone Encounter (Signed)
This is a [place] pt, Dr. McDowell 

## 2023-01-23 ENCOUNTER — Ambulatory Visit: Payer: Medicare Other | Attending: Cardiology | Admitting: Cardiology

## 2023-01-23 ENCOUNTER — Encounter: Payer: Self-pay | Admitting: Cardiology

## 2023-01-23 VITALS — BP 118/60 | HR 60 | Ht 69.5 in | Wt 210.6 lb

## 2023-01-23 DIAGNOSIS — I10 Essential (primary) hypertension: Secondary | ICD-10-CM | POA: Diagnosis not present

## 2023-01-23 DIAGNOSIS — E782 Mixed hyperlipidemia: Secondary | ICD-10-CM | POA: Diagnosis not present

## 2023-01-23 DIAGNOSIS — N1832 Chronic kidney disease, stage 3b: Secondary | ICD-10-CM | POA: Diagnosis not present

## 2023-01-23 DIAGNOSIS — I25119 Atherosclerotic heart disease of native coronary artery with unspecified angina pectoris: Secondary | ICD-10-CM | POA: Diagnosis not present

## 2023-01-23 NOTE — Progress Notes (Signed)
    Cardiology Office Note  Date: 01/23/2023   ID: Kyle Guia., DOB 07/15/1945, MRN [id number]  History of Present Illness: Kyle Plott. is a 77 y.o. male last seen in February.  He is here for a follow-up visit.  Reports no angina or nitroglycerin use, NYHA class II dyspnea.  Still functional with ADLs and yard work although stamina is not what it used to be and he does have to rest.  I reviewed his medications.  Cardiac regimen includes aspirin, Norvasc, Jardiance, Zetia, hydralazine, lisinopril, Toprol-XL, and Crestor.  Lipid panel in March showed continued improvement in LDL down to 86 on Crestor.  Physical Exam: VS:  BP 118/60   Pulse 60   Ht 5' 9.5" (1.765 m)   Wt 210 lb 9.6 oz (95.5 kg)   SpO2 93%   BMI 30.65 kg/m , BMI Body mass index is 30.65 kg/m.  Wt Readings from Last 3 Encounters:  01/23/23 210 lb 9.6 oz (95.5 kg)  11/18/22 206 lb (93.4 kg)  09/11/22 207 lb 6.4 oz (94.1 kg)    General: Patient appears comfortable at rest. HEENT: Conjunctiva and lids normal. Neck: Supple, no elevated JVP or carotid bruits. Lungs: Clear to auscultation, nonlabored breathing at rest. Cardiac: Regular rate and rhythm, no S3 or significant systolic murmur. Extremities: No pitting edema.  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  Labwork: 05/18/2022: ALT 12; AST 30; B Natriuretic Peptide 696.0; Magnesium 2.2 05/19/2022: BUN 40; Creatinine, Ser 1.98; Hemoglobin 12.6; Platelets 145; Potassium 5.4; Sodium 137     Component Value Date/Time   CHOL 148 07/25/2022 0815   CHOL 238 (H) 07/04/2019 1501   TRIG 59 07/25/2022 0815   HDL 50 07/25/2022 0815   HDL 53 07/04/2019 1501   CHOLHDL 3.0 07/25/2022 0815   VLDL 12 07/25/2022 0815   LDLCALC 86 07/25/2022 0815   LDLCALC 163 (H) 07/04/2019 1501   Other Studies Reviewed Today:  No interval cardiac testing  for review today.  Assessment and Plan:  1.  CAD status post balloon angioplasty of the distal LAD in the setting of NSTEMI in 2018.  Follow-up Lexiscan Myoview in 2022 was low risk.  He does not report any active angina or nitroglycerin use.  Plan to continue with observation on medical therapy at this time.  Currently on aspirin, Crestor, Toprol-XL, and Jardiance (switching from [place]).  2.  Essential hypertension.  Blood pressure is well-controlled today.  Continue Norvasc, hydralazine, and lisinopril.  3.  Mixed hyperlipidemia.  LDL 86 (decreased from 115) in March.  Plan to recheck FLP around time of his next visit and continue to titrate as needed.  4.  CKD stage IIIb.  Last creatinine 1.98.  Disposition:  Follow up  6 months.  Signed, Jonelle Sidle, M.D., F.A.C.C. [facility]

## 2023-01-23 NOTE — Patient Instructions (Signed)
Medication Instructions:  Your physician recommends that you continue on your current medications as directed. Please refer to the Current Medication list given to you today.   Labwork: None today  Testing/Procedures: None today  Follow-Up: 6 months  Any Other Special Instructions Will Be Listed Below (If Applicable).  If you need a refill on your cardiac medications before your next appointment, please call your pharmacy.  

## 2023-02-12 ENCOUNTER — Other Ambulatory Visit: Payer: Self-pay | Admitting: Internal Medicine

## 2023-02-12 DIAGNOSIS — G609 Hereditary and idiopathic neuropathy, unspecified: Secondary | ICD-10-CM

## 2023-03-13 LAB — HM DIABETES EYE EXAM

## 2023-03-16 ENCOUNTER — Other Ambulatory Visit: Payer: Self-pay | Admitting: Internal Medicine

## 2023-03-20 ENCOUNTER — Ambulatory Visit: Payer: Medicare Other | Admitting: Internal Medicine

## 2023-03-20 ENCOUNTER — Encounter: Payer: Self-pay | Admitting: Internal Medicine

## 2023-03-20 VITALS — BP 138/62 | HR 75 | Ht 69.5 in | Wt 212.8 lb

## 2023-03-20 DIAGNOSIS — E1122 Type 2 diabetes mellitus with diabetic chronic kidney disease: Secondary | ICD-10-CM | POA: Diagnosis not present

## 2023-03-20 DIAGNOSIS — Z23 Encounter for immunization: Secondary | ICD-10-CM | POA: Diagnosis not present

## 2023-03-20 DIAGNOSIS — E875 Hyperkalemia: Secondary | ICD-10-CM

## 2023-03-20 DIAGNOSIS — Z7984 Long term (current) use of oral hypoglycemic drugs: Secondary | ICD-10-CM

## 2023-03-20 DIAGNOSIS — E782 Mixed hyperlipidemia: Secondary | ICD-10-CM

## 2023-03-20 DIAGNOSIS — B351 Tinea unguium: Secondary | ICD-10-CM

## 2023-03-20 DIAGNOSIS — I25110 Atherosclerotic heart disease of native coronary artery with unstable angina pectoris: Secondary | ICD-10-CM

## 2023-03-20 DIAGNOSIS — N1832 Chronic kidney disease, stage 3b: Secondary | ICD-10-CM

## 2023-03-20 DIAGNOSIS — I10 Essential (primary) hypertension: Secondary | ICD-10-CM | POA: Diagnosis not present

## 2023-03-20 MED ORDER — TERBINAFINE HCL 1 % EX CREA: 1.0000 | TOPICAL_CREAM | Freq: Two times a day (BID) | CUTANEOUS | 0 refills | Status: DC

## 2023-03-20 NOTE — Assessment & Plan Note (Signed)
On statin and Zetia currently 

## 2023-03-20 NOTE — Assessment & Plan Note (Addendum)
Lab Results  Component Value Date   HGBA1C 5.9 09/11/2022    Well controlled On Jardiance 10 mg twice in a week - had AKI with once daily dose Advised to follow diabetic diet On statin and ACEi F/u CMP and lipid panel Diabetic foot exam: Today Diabetic eye exam: Advised to follow up with Ophthalmology for diabetic eye exam

## 2023-03-20 NOTE — Assessment & Plan Note (Signed)
S/p stent placement On aspirin and statin On Metoprolol Denies chest pain or dyspnea currently 

## 2023-03-20 NOTE — Assessment & Plan Note (Signed)
BP Readings from Last 1 Encounters:  03/20/23 138/66   Well-controlled with lisinopril 10 mg QD, amlodipine 10 mg QD and metoprolol 50 mg QD and hydralazine 25 mg BID Counseled for compliance with the medications Advised DASH diet and moderate exercise/walking as tolerated

## 2023-03-20 NOTE — Assessment & Plan Note (Signed)
Advised to try Lamisil cream If persistent, will start oral Lamisil

## 2023-03-20 NOTE — Assessment & Plan Note (Addendum)
Last BMP reviewed from 03/24 - needs repeat BMP Followed by nephrologist On sodium bicarb, ACEi and Lasix On Jardiance for proteinuria

## 2023-03-20 NOTE — Progress Notes (Signed)
Established Patient Office Visit  Subjective:  Patient ID: Kyle Lyga., male    DOB: 1946-01-18  Age: 77 y.o. MRN: [id number]  CC:  Chief Complaint  Patient presents with   Hypertension    Four month follow up   Diabetes    Four month follow up    HPI Kyle Valderrama. is a 77 y.o. male with past medical history of CAD, HTN, CKD, peripheral neuropathy and HLD who presents for f/u of his chronic medical conditions.  HTN and CAD: He has had stent placed for history of NSTEMI in 2018.  He is on aspirin and statin currently. BP is wnl today. Takes medications regularly. Patient denies headache, dizziness, chest pain, or palpitations.  CKD stage IIIb: Followed by Dr. Wolfgang Phoenix.  His last BMP showed GFR of 35 in 03/24, which was lower compared to his baseline. He has not seen Dr. Wolfgang Phoenix since then, but has appointment in 12/24.  He currently denies any dysuria, hematuria, urinary hesitance or resistance.  Type II DM: He is currently on Jardiance 10 mg - twice in a week for CKD.  His HbA1c was 5.9 in 04/24.  He denies polyuria or polyphagia currently.  He has history of peripheral neuropathy, for which he takes Lyrica 200 mg twice daily.  He has chronic burning pain in his feet and also complains of intermittent weakness of the LE, but have improved since increasing dose of Lyrica.  Denies any recent fall.   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   Hyperlipidemia    Pulmonary embolism (HCC) 10/2011    Past Surgical History:  Procedure Laterality Date   ABDOMINAL SURGERY     colon polyp removed 2007-  sepsis, coma for 4 months per pt   BIOPSY  12/24/2020   Procedure: BIOPSY;  Surgeon: Lanelle Bal, DO;  Location: [facility];  Service: Endoscopy;;   CATARACT EXTRACTION W/PHACO Right 08/19/2021   Procedure: CATARACT EXTRACTION PHACO AND INTRAOCULAR LENS PLACEMENT (IOC);  Surgeon: Fabio Pierce, MD;  Location: [facility];  Service: Ophthalmology;  Laterality: Right;  CDE: 7.02    CATARACT EXTRACTION W/PHACO Left 09/30/2021   Procedure: CATARACT EXTRACTION PHACO AND INTRAOCULAR LENS PLACEMENT (IOC);  Surgeon: Fabio Pierce, MD;  Location: [facility];  Service: Ophthalmology;  Laterality: Left;  CDE 4.61   COLONOSCOPY  03/22/2010   RMR: 1. Normal rectum 2. Diminutive polyp at the mouth of the ileocecal valve  status post cold biopsy removal. Remaideer of the colonic mucosa and terminal ileum mucosa appeared normal.    COLONOSCOPY N/A 06/25/2015   Procedure: COLONOSCOPY;  Surgeon: Corbin Ade, MD;  Location: [facility];  Service: Endoscopy;  Laterality: N/A;  200-rescheduled 1/30 per Ginger    COLONOSCOPY WITH PROPOFOL N/A 12/24/2020   Procedure: COLONOSCOPY WITH PROPOFOL;  Surgeon: Lanelle Bal, DO;  Location: [facility];  Service: Endoscopy;  Laterality: N/A;  ASA III / 8:00   CORONARY BALLOON ANGIOPLASTY N/A 04/24/2017   Procedure: CORONARY BALLOON ANGIOPLASTY;  Surgeon: Corky Crafts, MD;  Location: [facility];  Service: Cardiovascular;  Laterality: N/A;   LEFT HEART CATH AND CORONARY ANGIOGRAPHY N/A 04/24/2017   Procedure: LEFT HEART CATH AND CORONARY ANGIOGRAPHY;  Surgeon: Corky Crafts, MD;  Location: [facility];  Service: Cardiovascular;  Laterality: N/A;   LUMBAR LAMINECTOMY  06/02/2012   LUMBAR LAMINECTOMY/DECOMPRESSION MICRODISCECTOMY  06/02/2012   Procedure: LUMBAR LAMINECTOMY/DECOMPRESSION MICRODISCECTOMY 1 LEVEL;  Surgeon: Karn Cassis, MD;  Location: [facility];  Service: Neurosurgery;  Laterality: Bilateral;  Bilateral Lumbar five-sacral one  Foraminotomy and microdiskectomy    POLYPECTOMY  12/24/2020   Procedure: POLYPECTOMY;  Surgeon: Lanelle Bal, DO;  Location: [facility];  Service: Endoscopy;;   TOTAL KNEE ARTHROPLASTY Right 05/16/2022   Procedure: TOTAL KNEE ARTHROPLASTY;  Surgeon: Frederico Hamman, MD;  Location: [facility];  Service: Orthopedics;  Laterality: Right;    Family History  Problem Relation Age of Onset   Heart attack Mother    Diabetes Mother    Stroke Father    Heart attack Father    Arthritis Other    Colon cancer Sister 81       Estimated age of onset; just found out about it in 64.   Neuropathy Neg Hx     Social History   Socioeconomic History   Marital status: Married    Spouse name: Not on file   Number of children: 1   Years of education: Not on file   Highest education level: Not on file  Occupational History   Occupation: retired  Tobacco Use   Smoking status: Former    Current packs/day: 0.00    Average packs/day: 0.1 packs/day for 20.0 years (2.0 ttl pk-yrs)    Types: Cigarettes    Start date: 05/08/1979    Quit date: 05/08/1999    Years since quitting: 23.8   Smokeless tobacco: Never  Vaping Use   Vaping status: Never Used  Substance and Sexual Activity   Alcohol use: No   Drug use: No   Sexual activity: Yes  Other Topics Concern   Not on file  Social History Narrative   Patient is right handed.   Patient drinks caffeine occasionally   Social Determinants of Health   Financial Resource Strain: Low Risk  (10/29/2021)   Overall Financial Resource Strain (CARDIA)    Difficulty of Paying Living Expenses: Not hard at all  Food Insecurity: Patient Declined (05/16/2022)   Hunger Vital Sign    Worried About Running Out of Food in the Last Year: Patient declined    Ran Out of Food in the Last Year: Patient declined  Transportation Needs: No Transportation Needs (05/16/2022)   PRAPARE - Administrator, Civil Service (Medical): No    Lack of Transportation  (Non-Medical): No  Physical Activity: Inactive (10/29/2021)   Exercise Vital Sign    Days of Exercise per Week: 0 days    Minutes of Exercise per Session: 0 min  Stress: No Stress Concern Present (10/29/2021)   [employer] of Occupational Health - Occupational Stress Questionnaire    Feeling of Stress : Not at all  Social Connections: Socially Integrated (10/29/2021)   Social Connection and Isolation Panel [NHANES]    Frequency of Communication with Friends and Family: More than three times a week    Frequency of Social Gatherings with Friends and Family: More than three times a week    Attends Religious Services: More than 4 times per year    Active Member of  Clubs or Organizations: Yes    Attends Banker Meetings: More than 4 times per year    Marital Status: Married  Catering manager Violence: Not At Risk (05/16/2022)   Humiliation, Afraid, Rape, and Kick questionnaire    Fear of Current or Ex-Partner: No    Emotionally Abused: No    Physically Abused: No    Sexually Abused: No    Outpatient Medications Prior to Visit  Medication Sig Dispense Refill   amLODipine (NORVASC) 10 MG tablet TAKE ONE TABLET BY MOUTH ONCE DAILY. 90 tablet 1   aspirin 81 MG chewable tablet Chew 81 mg by mouth 2 (two) times daily.     atropine 1 % ophthalmic solution Apply to eye.     calcitRIOL (ROCALTROL) 0.25 MCG capsule Take 0.25 mcg by mouth every Monday, Wednesday, and Friday.     Coenzyme Q10 100 MG TABS Take 1 tablet by mouth daily.     Coenzyme Q10-Vitamin E 100-150 MG-UNIT CAPS Take 1 capsule by mouth at bedtime.     COLACE 100 MG capsule SMARTSIG:1-3 Capsule(s) By Mouth PRN     ezetimibe (ZETIA) 10 MG tablet TAKE (1) TABLET BY MOUTH ONCE DAILY. 90 tablet 3   furosemide (LASIX) 20 MG tablet TAKE 1 TABLET BY MOUTH ONCE DAILY. 90 tablet 2   hydrALAZINE (APRESOLINE) 25 MG tablet Take 1 tablet (25 mg total) by mouth 2 (two) times daily. 180 tablet 1   JARDIANCE 10 MG TABS tablet Take  10 mg by mouth 2 (two) times a week. Monday & Friday only-start after farxiga completed     lisinopril (ZESTRIL) 10 MG tablet Take 1 tablet (10 mg total) by mouth daily.     metoprolol succinate (TOPROL-XL) 50 MG 24 hr tablet TAKE (1) TABLET BY MOUTH ONCE DAILY. TAKE WITH OR IMMEDIATELY FOLLOWING A MEAL. 90 tablet 2   Misc Natural Products (OSTEO BI-FLEX ADV JOINT SHIELD) TABS Take 1 tablet by mouth daily.     Multiple Vitamins-Minerals (CENTRUM PO) Take 1 tablet by mouth every morning.     nitroGLYCERIN (NITROSTAT) 0.4 MG SL tablet PLACE 1 TAB UNDER TONGUE EVERY 5 MIN IF NEEDED FOR CHEST PAIN. MAY USE 3 TIMES.NO RELIEF CALL 911. 25 tablet 3   pregabalin (LYRICA) 200 MG capsule Take 1 capsule (200 mg total) by mouth 2 (two) times daily. 60 capsule 5   rosuvastatin (CRESTOR) 10 MG tablet TAKE (1) TABLET BY MOUTH ONCE DAILY. 90 tablet 1   sodium bicarbonate 650 MG tablet Take 650 mg by mouth 3 (three) times daily.     tobramycin (TOBREX) 0.3 % ophthalmic solution Apply to eye.     Multiple Vitamin (MULTI-VITAMIN) tablet Take 1 tablet by mouth daily.     albuterol (VENTOLIN HFA) 108 (90 Base) MCG/ACT inhaler Inhale 2 puffs into the lungs every 6 (six) hours as needed for wheezing or shortness of breath. (Patient not taking: Reported on 03/20/2023) 18 g 3   brimonidine (ALPHAGAN) 0.2 % ophthalmic solution Place 1 drop into the right eye 3 (three) times daily. (Patient not taking: Reported on 03/20/2023)     dorzolamide-timolol (COSOPT) 2-0.5 % ophthalmic solution Place 1 drop into the right eye 2 (two) times daily. (Patient not taking: Reported on 03/20/2023)     latanoprost (XALATAN) 0.005 % ophthalmic solution Place 1 drop into the left eye at bedtime. (Patient not taking: Reported on 03/20/2023)     oxyCODONE (OXY IR/ROXICODONE) 5 MG immediate release tablet Take 5 mg by mouth 4 (  four) times daily as needed. (Patient not taking: Reported on 03/20/2023)     No facility-administered medications prior  to visit.    Allergies  Allergen Reactions   Levaquin [Levofloxacin] Itching   Statins     Myalgias    ROS Review of Systems  Constitutional:  Negative for chills and fever.  HENT:  Negative for congestion and sore throat.   Eyes:  Negative for pain and discharge.  Respiratory:  Positive for shortness of breath (Intermittent, upon exertion). Negative for cough.   Cardiovascular:  Negative for chest pain and palpitations.  Gastrointestinal:  Negative for diarrhea, nausea and vomiting.  Endocrine: Negative for polydipsia and polyuria.  Genitourinary:  Negative for dysuria and hematuria.  Musculoskeletal:  Negative for neck pain and neck stiffness.  Skin:  Negative for rash.  Neurological:  Positive for weakness and numbness. Negative for dizziness and headaches.  Psychiatric/Behavioral:  Negative for agitation and behavioral problems.       Objective:    Physical Exam Vitals reviewed.  Constitutional:      General: He is not in acute distress.    Appearance: He is not diaphoretic.  HENT:     Head: Normocephalic and atraumatic.     Nose: Nose normal.     Mouth/Throat:     Mouth: Mucous membranes are moist.  Eyes:     General: No scleral icterus.    Extraocular Movements: Extraocular movements intact.  Cardiovascular:     Rate and Rhythm: Normal rate and regular rhythm.     Pulses: Normal pulses.     Heart sounds: Normal heart sounds. No murmur heard. Pulmonary:     Breath sounds: Normal breath sounds. No wheezing or rales.  Musculoskeletal:     Cervical back: Neck supple. No tenderness.     Right lower leg: No edema.     Left lower leg: No edema.  Feet:     Right foot:     Toenail Condition: Right toenails are abnormally thick. Fungal disease present.    Left foot:     Toenail Condition: Left toenails are abnormally thick. Fungal disease present. Skin:    General: Skin is warm.     Findings: No rash.  Neurological:     General: No focal deficit present.      Mental Status: He is alert and oriented to person, place, and time.     Sensory: Sensory deficit (B/l feet) present.     Motor: Weakness (4/5 in b/l LE) present.     Gait: Gait abnormal (slow).  Psychiatric:        Mood and Affect: Mood normal.        Behavior: Behavior normal.     BP 138/62 (BP Location: Left Arm)   Pulse 75   Ht 5' 9.5" (1.765 m)   Wt 212 lb 12.8 oz (96.5 kg)   SpO2 90%   BMI 30.97 kg/m  Wt Readings from Last 3 Encounters:  03/20/23 212 lb 12.8 oz (96.5 kg)  01/23/23 210 lb 9.6 oz (95.5 kg)  11/18/22 206 lb (93.4 kg)    Lab Results  Component Value Date   TSH 1.63 08/08/2019   Lab Results  Component Value Date   WBC 12.2 (H) 05/19/2022   HGB 12.6 (L) 05/19/2022   HCT 38.9 (L) 05/19/2022   MCV 91.5 05/19/2022   PLT 145 (L) 05/19/2022   Lab Results  Component Value Date   NA 137 05/19/2022   K 5.4 (H) 05/19/2022  CO2 21 (L) 05/19/2022   GLUCOSE 181 (H) 05/19/2022   BUN 40 (H) 05/19/2022   CREATININE 1.98 (H) 05/19/2022   BILITOT 0.6 05/18/2022   ALKPHOS 54 05/18/2022   AST 30 05/18/2022   ALT 12 05/18/2022   PROT 7.6 05/18/2022   ALBUMIN 3.5 05/18/2022   CALCIUM 8.8 (L) 05/19/2022   ANIONGAP 6 05/19/2022   Lab Results  Component Value Date   CHOL 148 07/25/2022   Lab Results  Component Value Date   HDL 50 07/25/2022   Lab Results  Component Value Date   LDLCALC 86 07/25/2022   Lab Results  Component Value Date   TRIG 59 07/25/2022   Lab Results  Component Value Date   CHOLHDL 3.0 07/25/2022   Lab Results  Component Value Date   HGBA1C 5.9 09/11/2022      Assessment & Plan:   Problem List Items Addressed This Visit       Cardiovascular and Mediastinum   Hypertension    BP Readings from Last 1 Encounters:  03/20/23 138/66   Well-controlled with lisinopril 10 mg QD, amlodipine 10 mg QD and metoprolol 50 mg QD and hydralazine 25 mg BID Counseled for compliance with the medications Advised DASH diet and moderate  exercise/walking as tolerated      CAD (coronary artery disease)    S/p stent placement On aspirin and statin On Metoprolol Denies chest pain or dyspnea currently      Relevant Orders   Lipid Profile     Endocrine   Type 2 diabetes mellitus with stage 3b chronic kidney disease, without long-term current use of insulin (HCC) - Primary    Lab Results  Component Value Date   HGBA1C 5.9 09/11/2022    Well controlled On Jardiance 10 mg twice in a week - had AKI with once daily dose Advised to follow diabetic diet On statin and ACEi F/u CMP and lipid panel Diabetic foot exam: Today Diabetic eye exam: Advised to follow up with Ophthalmology for diabetic eye exam      Relevant Orders   CMP14+EGFR   Hemoglobin A1c   Urine Microalbumin w/creat. ratio     Musculoskeletal and Integument   Onychomycosis    Advised to try Lamisil cream If persistent, will start oral Lamisil      Relevant Medications   terbinafine (LAMISIL AT) 1 % cream     Genitourinary   CKD (chronic kidney disease)    Last BMP reviewed from 03/24 - needs repeat BMP Followed by nephrologist On sodium bicarb, ACEi and Lasix On Jardiance for proteinuria        Other   Hyperlipidemia (Chronic)    On statin and Zetia currently      Relevant Orders   Lipid Profile   Other Visit Diagnoses     Encounter for immunization       Relevant Orders   Flu Vaccine Trivalent High Dose (Fluad) (Completed)        Meds ordered this encounter  Medications   terbinafine (LAMISIL AT) 1 % cream    Sig: Apply 1 Application topically 2 (two) times daily.    Dispense:  30 g    Refill:  0    Follow-up: Return in about 4 months (around 07/21/2023) for Annual physical.    Anabel Halon, MD

## 2023-03-20 NOTE — Patient Instructions (Addendum)
Please use Albuterol inhaler as needed for shortness of breath or wheezing.  Please continue to take medications as prescribed.  Please continue to follow low carb diet and perform moderate exercise/walking at least 150 mins/week.

## 2023-03-22 LAB — CMP14+EGFR
ALT: 27 [IU]/L (ref 0–44)
AST: 36 [IU]/L (ref 0–40)
Albumin: 4.3 g/dL (ref 3.8–4.8)
Alkaline Phosphatase: 79 IU/L (ref 44–121)
BUN/Creatinine Ratio: 15 (ref 10–24)
BUN: 36 mg/dL — ABNORMAL HIGH (ref 8–27)
Bilirubin Total: 0.3 mg/dL (ref 0.0–1.2)
CO2: 18 mmol/L — ABNORMAL LOW (ref 20–29)
Calcium: 9.3 mg/dL (ref 8.6–10.2)
Chloride: 111 mmol/L — ABNORMAL HIGH (ref 96–106)
Creatinine, Ser: 2.37 mg/dL — ABNORMAL HIGH (ref 0.76–1.27)
Globulin, Total: 3.6 g/dL (ref 1.5–4.5)
Glucose: 98 mg/dL (ref 70–99)
Potassium: 6 mmol/L — ABNORMAL HIGH (ref 3.5–5.2)
Sodium: 142 mmol/L (ref 134–144)
Total Protein: 7.9 g/dL (ref 6.0–8.5)
eGFR: 28 mL/min/{1.73_m2} — ABNORMAL LOW (ref >59)

## 2023-03-22 LAB — MICROALBUMIN / CREATININE URINE RATIO
Creatinine, Urine: 85 mg/dL
Microalb/Creat Ratio: 38 mg/g{creat} — ABNORMAL HIGH (ref 0–29)
Microalbumin, Urine: 32.2 ug/mL

## 2023-03-22 LAB — HEMOGLOBIN A1C
Est. average glucose Bld gHb Est-mCnc: 137 mg/dL
Hgb A1c MFr Bld: 6.4 % — ABNORMAL HIGH (ref 4.8–5.6)

## 2023-03-22 LAB — LIPID PANEL
Chol/HDL Ratio: 3.5 ratio (ref 0.0–5.0)
Cholesterol, Total: 176 mg/dL (ref 100–199)
HDL: 51 mg/dL (ref >39)
LDL Chol Calc (NIH): 102 mg/dL — ABNORMAL HIGH (ref 0–99)
Triglycerides: 132 mg/dL (ref 0–149)
VLDL Cholesterol Cal: 23 mg/dL (ref 5–40)

## 2023-03-23 ENCOUNTER — Other Ambulatory Visit: Payer: Self-pay | Admitting: Internal Medicine

## 2023-03-23 DIAGNOSIS — E875 Hyperkalemia: Secondary | ICD-10-CM

## 2023-03-23 MED ORDER — LOKELMA 10 G PO PACK: 10.0000 g | PACK | Freq: Every day | ORAL | 0 refills | Status: DC

## 2023-03-23 NOTE — Addendum Note (Signed)
Addended byTrena Platt on: 03/23/2023 08:10 AM   Modules accepted: Orders

## 2023-04-06 ENCOUNTER — Encounter: Payer: Self-pay | Admitting: Internal Medicine

## 2023-04-06 ENCOUNTER — Ambulatory Visit: Payer: Medicare Other | Admitting: Internal Medicine

## 2023-04-06 VITALS — BP 136/70 | HR 84 | Ht 69.5 in | Wt 216.0 lb

## 2023-04-06 DIAGNOSIS — E875 Hyperkalemia: Secondary | ICD-10-CM | POA: Diagnosis not present

## 2023-04-06 DIAGNOSIS — N1832 Chronic kidney disease, stage 3b: Secondary | ICD-10-CM | POA: Diagnosis not present

## 2023-04-06 DIAGNOSIS — I10 Essential (primary) hypertension: Secondary | ICD-10-CM

## 2023-04-06 NOTE — Progress Notes (Unsigned)
Established Patient Office Visit  Subjective:  Patient ID: Kyle Nuttall., male    DOB: 1946/02/15  Age: 77 y.o. MRN: [id number]  CC:  Chief Complaint  Patient presents with   Hypertension    Follow up     HPI Kyle Debock. is a 77 y.o. male with past medical history of CAD, HTN, CKD, peripheral neuropathy and HLD who presents for f/u of his recent hyperkalemia - K: 6.1.  He has stopped taking lisinopril since the last blood test due to hyperkalemia. His BP is borderline elevated, but overall acceptable today.  He denies any headache, dizziness, chest pain, dyspnea, palpitations.  Denies any urinary hesitancy or resistance.  From previous visit for review purpose only: HTN and CAD: He has had stent placed for history of NSTEMI in 2018.  He is on aspirin and statin currently. BP is wnl today. Takes medications regularly. Patient denies headache, dizziness, chest pain, or palpitations.  CKD stage IIIb: Followed by Dr. Wolfgang Phoenix.  His last BMP showed GFR of 35 in 03/24, which was lower compared to his baseline. He has not seen Dr. Wolfgang Phoenix since then, but has appointment in 12/24.  He currently denies any dysuria, hematuria, urinary hesitance or resistance.  Type II DM: He is currently on Jardiance 10 mg - twice in a week for CKD.  His HbA1c was 5.9 in 04/24.  He denies polyuria or polyphagia currently.  He has history of peripheral neuropathy, for which he takes Lyrica 200 mg twice daily.  He has chronic burning pain in his feet and also complains of intermittent weakness of the LE, but have improved since increasing dose of Lyrica.  Denies any recent fall.   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   Hyperlipidemia    Pulmonary embolism (HCC) 10/2011    Past Surgical History:  Procedure Laterality Date   ABDOMINAL SURGERY     colon polyp removed 2007- sepsis, coma for 4 months per pt   BIOPSY  12/24/2020   Procedure: BIOPSY;  Surgeon: Lanelle Bal, DO;  Location: [facility];  Service: Endoscopy;;   CATARACT EXTRACTION W/PHACO Right 08/19/2021   Procedure: CATARACT EXTRACTION PHACO AND INTRAOCULAR LENS PLACEMENT (IOC);  Surgeon: Fabio Pierce, MD;  Location: [facility];  Service: Ophthalmology;  Laterality: Right;  CDE: 7.02    CATARACT EXTRACTION W/PHACO Left 09/30/2021   Procedure: CATARACT EXTRACTION PHACO AND INTRAOCULAR LENS PLACEMENT (IOC);  Surgeon: Fabio Pierce, MD;  Location: [facility];  Service: Ophthalmology;  Laterality: Left;  CDE 4.61   COLONOSCOPY  03/22/2010   RMR: 1. Normal rectum 2. Diminutive polyp at the mouth of the ileocecal valve  status post cold biopsy removal. Remaideer of the colonic mucosa and terminal ileum mucosa appeared normal.    COLONOSCOPY N/A 06/25/2015   Procedure: COLONOSCOPY;  Surgeon: Corbin Ade, MD;  Location: [facility];  Service: Endoscopy;  Laterality: N/A;  200-rescheduled 1/30 per Ginger    COLONOSCOPY WITH PROPOFOL N/A 12/24/2020   Procedure: COLONOSCOPY WITH PROPOFOL;  Surgeon: Earnest Bailey  K, DO;  Location: [facility];  Service: Endoscopy;  Laterality: N/A;  ASA III / 8:00   CORONARY BALLOON ANGIOPLASTY N/A 04/24/2017   Procedure: CORONARY BALLOON ANGIOPLASTY;  Surgeon: Corky Crafts, MD;  Location: [facility];  Service: Cardiovascular;  Laterality: N/A;   LEFT HEART CATH AND CORONARY ANGIOGRAPHY N/A 04/24/2017   Procedure: LEFT HEART CATH AND CORONARY ANGIOGRAPHY;  Surgeon: Corky Crafts, MD;  Location: [facility];  Service: Cardiovascular;  Laterality: N/A;   LUMBAR LAMINECTOMY  06/02/2012    LUMBAR LAMINECTOMY/DECOMPRESSION MICRODISCECTOMY  06/02/2012   Procedure: LUMBAR LAMINECTOMY/DECOMPRESSION MICRODISCECTOMY 1 LEVEL;  Surgeon: Karn Cassis, MD;  Location: [facility];  Service: Neurosurgery;  Laterality: Bilateral;  Bilateral Lumbar five-sacral one Foraminotomy and microdiskectomy    POLYPECTOMY  12/24/2020   Procedure: POLYPECTOMY;  Surgeon: Lanelle Bal, DO;  Location: [facility];  Service: Endoscopy;;   TOTAL KNEE ARTHROPLASTY Right 05/16/2022   Procedure: TOTAL KNEE ARTHROPLASTY;  Surgeon: Frederico Hamman, MD;  Location: [facility];  Service: Orthopedics;  Laterality: Right;    Family History  Problem Relation Age of Onset   Heart attack Mother    Diabetes Mother    Stroke Father    Heart attack Father    Arthritis Other    Colon cancer Sister 30       Estimated age of onset; just found out about it in 81.   Neuropathy Neg Hx     Social History   Socioeconomic History   Marital status: Married    Spouse name: Not on file   Number of children: 1   Years of education: Not on file   Highest education level: Not on file  Occupational History   Occupation: retired  Tobacco Use   Smoking status: Former    Current packs/day: 0.00    Average packs/day: 0.1 packs/day for 20.0 years (2.0 ttl pk-yrs)    Types: Cigarettes    Start date: 05/08/1979    Quit date: 05/08/1999    Years since quitting: 23.9   Smokeless tobacco: Never  Vaping Use   Vaping status: Never Used  Substance and Sexual Activity   Alcohol use: No   Drug use: No   Sexual activity: Yes  Other Topics Concern   Not on file  Social History Narrative   Patient is right handed.   Patient drinks caffeine occasionally   Social Determinants of Health   Financial Resource Strain: Low Risk  (10/29/2021)   Overall Financial Resource Strain (CARDIA)    Difficulty of Paying Living Expenses: Not hard at all  Food Insecurity: Patient Declined (05/16/2022)   Hunger Vital Sign    Worried About  Running Out of Food in the Last Year: Patient declined    Ran Out of Food in the Last Year: Patient declined  Transportation Needs: No Transportation Needs (05/16/2022)   PRAPARE - Administrator, Civil Service (Medical): No    Lack of Transportation (Non-Medical): No  Physical Activity: Inactive (10/29/2021)   Exercise Vital Sign    Days of Exercise per Week: 0 days    Minutes of Exercise per Session: 0 min  Stress: No Stress Concern Present (10/29/2021)   [employer] of Occupational Health - Occupational Stress Questionnaire    Feeling of Stress : Not at all  Social Connections: Socially Integrated (10/29/2021)   Social Connection and Isolation Panel [NHANES]    Frequency of Communication with Friends and Family: More than three  times a week    Frequency of Social Gatherings with Friends and Family: More than three times a week    Attends Religious Services: More than 4 times per year    Active Member of [employer] or Organizations: Yes    Attends Engineer, structural: More than 4 times per year    Marital Status: Married  Catering manager Violence: Not At Risk (05/16/2022)   Humiliation, Afraid, Rape, and Kick questionnaire    Fear of Current or Ex-Partner: No    Emotionally Abused: No    Physically Abused: No    Sexually Abused: No    Outpatient Medications Prior to Visit  Medication Sig Dispense Refill   albuterol (VENTOLIN HFA) 108 (90 Base) MCG/ACT inhaler Inhale 2 puffs into the lungs every 6 (six) hours as needed for wheezing or shortness of breath. (Patient not taking: Reported on 03/20/2023) 18 g 3   amLODipine (NORVASC) 10 MG tablet TAKE ONE TABLET BY MOUTH ONCE DAILY. 90 tablet 1   aspirin 81 MG chewable tablet Chew 81 mg by mouth 2 (two) times daily.     atropine 1 % ophthalmic solution Apply to eye.     brimonidine (ALPHAGAN) 0.2 % ophthalmic solution Place 1 drop into the right eye 3 (three) times daily. (Patient not taking: Reported on 03/20/2023)      calcitRIOL (ROCALTROL) 0.25 MCG capsule Take 0.25 mcg by mouth every Monday, Wednesday, and Friday.     Coenzyme Q10 100 MG TABS Take 1 tablet by mouth daily.     Coenzyme Q10-Vitamin E 100-150 MG-UNIT CAPS Take 1 capsule by mouth at bedtime.     COLACE 100 MG capsule SMARTSIG:1-3 Capsule(s) By Mouth PRN     dorzolamide-timolol (COSOPT) 2-0.5 % ophthalmic solution Place 1 drop into the right eye 2 (two) times daily. (Patient not taking: Reported on 03/20/2023)     ezetimibe (ZETIA) 10 MG tablet TAKE (1) TABLET BY MOUTH ONCE DAILY. 90 tablet 3   furosemide (LASIX) 20 MG tablet TAKE 1 TABLET BY MOUTH ONCE DAILY. 90 tablet 2   hydrALAZINE (APRESOLINE) 25 MG tablet Take 1 tablet (25 mg total) by mouth 2 (two) times daily. 180 tablet 1   JARDIANCE 10 MG TABS tablet Take 10 mg by mouth 2 (two) times a week. Monday & Friday only-start after farxiga completed     latanoprost (XALATAN) 0.005 % ophthalmic solution Place 1 drop into the left eye at bedtime. (Patient not taking: Reported on 03/20/2023)     lisinopril (ZESTRIL) 10 MG tablet Take 1 tablet (10 mg total) by mouth daily.     metoprolol succinate (TOPROL-XL) 50 MG 24 hr tablet TAKE (1) TABLET BY MOUTH ONCE DAILY. TAKE WITH OR IMMEDIATELY FOLLOWING A MEAL. 90 tablet 2   Misc Natural Products (OSTEO BI-FLEX ADV JOINT SHIELD) TABS Take 1 tablet by mouth daily.     Multiple Vitamins-Minerals (CENTRUM PO) Take 1 tablet by mouth every morning.     nitroGLYCERIN (NITROSTAT) 0.4 MG SL tablet PLACE 1 TAB UNDER TONGUE EVERY 5 MIN IF NEEDED FOR CHEST PAIN. MAY USE 3 TIMES.NO RELIEF CALL 911. 25 tablet 3   oxyCODONE (OXY IR/ROXICODONE) 5 MG immediate release tablet Take 5 mg by mouth 4 (four) times daily as needed. (Patient not taking: Reported on 03/20/2023)     pregabalin (LYRICA) 200 MG capsule Take 1 capsule (200 mg total) by mouth 2 (two) times daily. 60 capsule 5   rosuvastatin (CRESTOR) 10 MG tablet TAKE (1) TABLET BY  MOUTH ONCE DAILY. 90 tablet 1    sodium bicarbonate 650 MG tablet Take 650 mg by mouth 3 (three) times daily.     sodium zirconium cyclosilicate (LOKELMA) 10 g PACK packet Take 10 g by mouth daily. 3 each 0   terbinafine (LAMISIL AT) 1 % cream Apply 1 Application topically 2 (two) times daily. 30 g 0   tobramycin (TOBREX) 0.3 % ophthalmic solution Apply to eye.     No facility-administered medications prior to visit.    Allergies  Allergen Reactions   Levaquin [Levofloxacin] Itching   Statins     Myalgias    ROS Review of Systems  Constitutional:  Negative for chills and fever.  HENT:  Negative for congestion and sore throat.   Eyes:  Negative for pain and discharge.  Respiratory:  Positive for shortness of breath (Intermittent, upon exertion). Negative for cough.   Cardiovascular:  Negative for chest pain and palpitations.  Gastrointestinal:  Negative for diarrhea, nausea and vomiting.  Endocrine: Negative for polydipsia and polyuria.  Genitourinary:  Negative for dysuria and hematuria.  Musculoskeletal:  Negative for neck pain and neck stiffness.  Skin:  Negative for rash.  Neurological:  Positive for weakness and numbness. Negative for dizziness and headaches.  Psychiatric/Behavioral:  Negative for agitation and behavioral problems.       Objective:    Physical Exam Vitals reviewed.  Constitutional:      General: He is not in acute distress.    Appearance: He is not diaphoretic.  HENT:     Head: Normocephalic and atraumatic.     Nose: Nose normal.     Mouth/Throat:     Mouth: Mucous membranes are moist.  Eyes:     General: No scleral icterus.    Extraocular Movements: Extraocular movements intact.  Cardiovascular:     Rate and Rhythm: Normal rate and regular rhythm.     Pulses: Normal pulses.     Heart sounds: Normal heart sounds. No murmur heard. Pulmonary:     Breath sounds: Normal breath sounds. No wheezing or rales.  Musculoskeletal:     Cervical back: Neck supple. No tenderness.      Right lower leg: No edema.     Left lower leg: No edema.  Feet:     Right foot:     Toenail Condition: Right toenails are abnormally thick. Fungal disease present.    Left foot:     Toenail Condition: Left toenails are abnormally thick. Fungal disease present. Skin:    General: Skin is warm.     Findings: No rash.  Neurological:     General: No focal deficit present.     Mental Status: He is alert and oriented to person, place, and time.     Sensory: Sensory deficit (B/l feet) present.     Motor: Weakness (4/5 in b/l LE) present.     Gait: Gait abnormal (slow).  Psychiatric:        Mood and Affect: Mood normal.        Behavior: Behavior normal.     BP 136/70 (BP Location: Left Arm)   Pulse 84   Ht 5' 9.5" (1.765 m)   Wt 216 lb (98 kg)   SpO2 90%   BMI 31.44 kg/m  Wt Readings from Last 3 Encounters:  04/06/23 216 lb (98 kg)  03/20/23 212 lb 12.8 oz (96.5 kg)  01/23/23 210 lb 9.6 oz (95.5 kg)    Lab Results  Component Value Date   TSH  1.63 08/08/2019   Lab Results  Component Value Date   WBC 12.2 (H) 05/19/2022   HGB 12.6 (L) 05/19/2022   HCT 38.9 (L) 05/19/2022   MCV 91.5 05/19/2022   PLT 145 (L) 05/19/2022   Lab Results  Component Value Date   NA 141 04/06/2023   K 5.3 (H) 04/06/2023   CO2 19 (L) 04/06/2023   GLUCOSE 135 (H) 04/06/2023   BUN 31 (H) 04/06/2023   CREATININE 2.12 (H) 04/06/2023   BILITOT 0.3 03/20/2023   ALKPHOS 79 03/20/2023   AST 36 03/20/2023   ALT 27 03/20/2023   PROT 7.9 03/20/2023   ALBUMIN 4.3 03/20/2023   CALCIUM 9.3 04/06/2023   ANIONGAP 6 05/19/2022   EGFR 32 (L) 04/06/2023   Lab Results  Component Value Date   CHOL 176 03/20/2023   Lab Results  Component Value Date   HDL 51 03/20/2023   Lab Results  Component Value Date   LDLCALC 102 (H) 03/20/2023   Lab Results  Component Value Date   TRIG 132 03/20/2023   Lab Results  Component Value Date   CHOLHDL 3.5 03/20/2023   Lab Results  Component Value Date    HGBA1C 6.4 (H) 03/20/2023      Assessment & Plan:   Problem List Items Addressed This Visit       Cardiovascular and Mediastinum   Hypertension    BP Readings from Last 1 Encounters:  04/06/23 136/70   Overall well-controlled with amlodipine 10 mg QD and metoprolol 50 mg QD and hydralazine 25 mg BID Recently stopped lisinopril 10 mg once daily due to AKI and hyperkalemia Counseled for compliance with the medications Advised DASH diet and moderate exercise/walking as tolerated        Genitourinary   CKD (chronic kidney disease)    Last BMP reviewed -shows declining GFR, needs repeat BMP Followed by nephrologist On sodium bicarb and Lasix - stopped by lisinopril due to hyperkalemia On Jardiance twice weekly for proteinuria        Other   Hyperkalemia - Primary    Had prescribed Lokelma x 3 days Stopped lisinopril Recheck BMP, was supposed to get it done before the visit Advised to maintain adequate hydration Continue sodium bicarbonate Discussed with nephrologist-Dr. Wolfgang Phoenix, needs sooner appt         No orders of the defined types were placed in this encounter.   Follow-up: Return in about 3 months (around 07/07/2023).    Anabel Halon, MD

## 2023-04-06 NOTE — Patient Instructions (Signed)
Please continue to take medications as prescribed.  Please continue to follow low salt diet and perform moderate exercise/walking at least 150 mins/week.  Please avoid high potassium food such as potato, banana, avocado, etc.

## 2023-04-07 ENCOUNTER — Other Ambulatory Visit: Payer: Self-pay | Admitting: Internal Medicine

## 2023-04-07 LAB — BASIC METABOLIC PANEL
BUN/Creatinine Ratio: 15 (ref 10–24)
BUN: 31 mg/dL — ABNORMAL HIGH (ref 8–27)
CO2: 19 mmol/L — ABNORMAL LOW (ref 20–29)
Calcium: 9.3 mg/dL (ref 8.6–10.2)
Chloride: 105 mmol/L (ref 96–106)
Creatinine, Ser: 2.12 mg/dL — ABNORMAL HIGH (ref 0.76–1.27)
Glucose: 135 mg/dL — ABNORMAL HIGH (ref 70–99)
Potassium: 5.3 mmol/L — ABNORMAL HIGH (ref 3.5–5.2)
Sodium: 141 mmol/L (ref 134–144)
eGFR: 32 mL/min/{1.73_m2} — ABNORMAL LOW (ref >59)

## 2023-04-08 ENCOUNTER — Encounter: Payer: Self-pay | Admitting: Internal Medicine

## 2023-04-08 NOTE — Assessment & Plan Note (Addendum)
Had prescribed Lokelma x 3 days Stopped lisinopril Recheck BMP, was supposed to get it done before the visit Advised to maintain adequate hydration Continue sodium bicarbonate Discussed with nephrologist-Dr. Wolfgang Phoenix, needs sooner appt

## 2023-04-08 NOTE — Assessment & Plan Note (Signed)
Last BMP reviewed -shows declining GFR, needs repeat BMP Followed by nephrologist On sodium bicarb and Lasix - stopped by lisinopril due to hyperkalemia On Jardiance twice weekly for proteinuria

## 2023-04-08 NOTE — Assessment & Plan Note (Signed)
BP Readings from Last 1 Encounters:  04/06/23 136/70   Overall well-controlled with amlodipine 10 mg QD and metoprolol 50 mg QD and hydralazine 25 mg BID Recently stopped lisinopril 10 mg once daily due to AKI and hyperkalemia Counseled for compliance with the medications Advised DASH diet and moderate exercise/walking as tolerated

## 2023-04-14 ENCOUNTER — Other Ambulatory Visit: Payer: Self-pay | Admitting: Student

## 2023-05-15 ENCOUNTER — Other Ambulatory Visit: Payer: Self-pay | Admitting: Physician Assistant

## 2023-05-15 ENCOUNTER — Other Ambulatory Visit: Payer: Self-pay | Admitting: Internal Medicine

## 2023-05-15 DIAGNOSIS — I10 Essential (primary) hypertension: Secondary | ICD-10-CM

## 2023-05-30 ENCOUNTER — Other Ambulatory Visit: Payer: Self-pay | Admitting: Cardiology

## 2023-07-08 ENCOUNTER — Emergency Department (HOSPITAL_COMMUNITY): Payer: Medicare Other

## 2023-07-08 ENCOUNTER — Ambulatory Visit: Payer: Medicare Other | Admitting: Internal Medicine

## 2023-07-08 ENCOUNTER — Other Ambulatory Visit: Payer: Self-pay

## 2023-07-08 ENCOUNTER — Encounter: Payer: Self-pay | Admitting: Internal Medicine

## 2023-07-08 ENCOUNTER — Encounter (HOSPITAL_COMMUNITY): Payer: Self-pay

## 2023-07-08 ENCOUNTER — Inpatient Hospital Stay (HOSPITAL_COMMUNITY)
Admission: EM | Admit: 2023-07-08 | Discharge: 2023-07-13 | DRG: 193 | Disposition: A | Discharge status: Home / Self Care | Payer: Medicare Other | Attending: Internal Medicine | Admitting: Internal Medicine

## 2023-07-08 VITALS — BP 136/65 | HR 83 | Ht 69.5 in | Wt 217.0 lb

## 2023-07-08 DIAGNOSIS — Z833 Family history of diabetes mellitus: Secondary | ICD-10-CM

## 2023-07-08 DIAGNOSIS — I129 Hypertensive chronic kidney disease with stage 1 through stage 4 chronic kidney disease, or unspecified chronic kidney disease: Secondary | ICD-10-CM | POA: Diagnosis present

## 2023-07-08 DIAGNOSIS — Z86711 Personal history of pulmonary embolism: Secondary | ICD-10-CM | POA: Diagnosis not present

## 2023-07-08 DIAGNOSIS — G609 Hereditary and idiopathic neuropathy, unspecified: Secondary | ICD-10-CM | POA: Diagnosis present

## 2023-07-08 DIAGNOSIS — Z7982 Long term (current) use of aspirin: Secondary | ICD-10-CM

## 2023-07-08 DIAGNOSIS — I2510 Atherosclerotic heart disease of native coronary artery without angina pectoris: Secondary | ICD-10-CM | POA: Diagnosis present

## 2023-07-08 DIAGNOSIS — I10 Essential (primary) hypertension: Secondary | ICD-10-CM | POA: Diagnosis present

## 2023-07-08 DIAGNOSIS — J441 Chronic obstructive pulmonary disease with (acute) exacerbation: Secondary | ICD-10-CM | POA: Diagnosis not present

## 2023-07-08 DIAGNOSIS — N1832 Chronic kidney disease, stage 3b: Secondary | ICD-10-CM | POA: Diagnosis present

## 2023-07-08 DIAGNOSIS — Z1152 Encounter for screening for COVID-19: Secondary | ICD-10-CM | POA: Diagnosis not present

## 2023-07-08 DIAGNOSIS — Z7984 Long term (current) use of oral hypoglycemic drugs: Secondary | ICD-10-CM

## 2023-07-08 DIAGNOSIS — Z860100 Personal history of colon polyps, unspecified: Secondary | ICD-10-CM

## 2023-07-08 DIAGNOSIS — I252 Old myocardial infarction: Secondary | ICD-10-CM | POA: Diagnosis not present

## 2023-07-08 DIAGNOSIS — E785 Hyperlipidemia, unspecified: Secondary | ICD-10-CM | POA: Diagnosis present

## 2023-07-08 DIAGNOSIS — Z888 Allergy status to other drugs, medicaments and biological substances status: Secondary | ICD-10-CM | POA: Diagnosis not present

## 2023-07-08 DIAGNOSIS — E875 Hyperkalemia: Secondary | ICD-10-CM | POA: Diagnosis not present

## 2023-07-08 DIAGNOSIS — E1122 Type 2 diabetes mellitus with diabetic chronic kidney disease: Secondary | ICD-10-CM | POA: Diagnosis present

## 2023-07-08 DIAGNOSIS — R7303 Prediabetes: Secondary | ICD-10-CM | POA: Diagnosis present

## 2023-07-08 DIAGNOSIS — J9601 Acute respiratory failure with hypoxia: Secondary | ICD-10-CM

## 2023-07-08 DIAGNOSIS — I25110 Atherosclerotic heart disease of native coronary artery with unstable angina pectoris: Secondary | ICD-10-CM

## 2023-07-08 DIAGNOSIS — J42 Unspecified chronic bronchitis: Secondary | ICD-10-CM

## 2023-07-08 DIAGNOSIS — Z800 Family history of malignant neoplasm of digestive organs: Secondary | ICD-10-CM | POA: Diagnosis not present

## 2023-07-08 DIAGNOSIS — J440 Chronic obstructive pulmonary disease with acute lower respiratory infection: Secondary | ICD-10-CM | POA: Diagnosis present

## 2023-07-08 DIAGNOSIS — Z96651 Presence of right artificial knee joint: Secondary | ICD-10-CM | POA: Diagnosis present

## 2023-07-08 DIAGNOSIS — Z823 Family history of stroke: Secondary | ICD-10-CM | POA: Diagnosis not present

## 2023-07-08 DIAGNOSIS — Z881 Allergy status to other antibiotic agents status: Secondary | ICD-10-CM

## 2023-07-08 DIAGNOSIS — Z87891 Personal history of nicotine dependence: Secondary | ICD-10-CM

## 2023-07-08 DIAGNOSIS — Z8249 Family history of ischemic heart disease and other diseases of the circulatory system: Secondary | ICD-10-CM

## 2023-07-08 DIAGNOSIS — J189 Pneumonia, unspecified organism: Secondary | ICD-10-CM | POA: Diagnosis present

## 2023-07-08 DIAGNOSIS — N189 Chronic kidney disease, unspecified: Secondary | ICD-10-CM | POA: Diagnosis present

## 2023-07-08 DIAGNOSIS — J449 Chronic obstructive pulmonary disease, unspecified: Secondary | ICD-10-CM | POA: Diagnosis present

## 2023-07-08 DIAGNOSIS — N183 Chronic kidney disease, stage 3 unspecified: Secondary | ICD-10-CM | POA: Diagnosis present

## 2023-07-08 LAB — COMPREHENSIVE METABOLIC PANEL
ALT: 21 U/L (ref 0–44)
AST: 29 U/L (ref 15–41)
Albumin: 3.6 g/dL (ref 3.5–5.0)
Alkaline Phosphatase: 55 U/L (ref 38–126)
Anion gap: 9 (ref 5–15)
BUN: 38 mg/dL — ABNORMAL HIGH (ref 8–23)
CO2: 24 mmol/L (ref 22–32)
Calcium: 9.1 mg/dL (ref 8.9–10.3)
Chloride: 103 mmol/L (ref 98–111)
Creatinine, Ser: 2.1 mg/dL — ABNORMAL HIGH (ref 0.61–1.24)
GFR, Estimated: 32 mL/min — ABNORMAL LOW (ref >60)
Glucose, Bld: 115 mg/dL — ABNORMAL HIGH (ref 70–99)
Potassium: 4.6 mmol/L (ref 3.5–5.1)
Sodium: 136 mmol/L (ref 135–145)
Total Bilirubin: 0.9 mg/dL (ref 0.0–1.2)
Total Protein: 8 g/dL (ref 6.5–8.1)

## 2023-07-08 LAB — RESP PANEL BY RT-PCR (RSV, FLU A&B, COVID)  RVPGX2
Influenza A by PCR: NEGATIVE
Influenza B by PCR: NEGATIVE
Resp Syncytial Virus by PCR: NEGATIVE
SARS Coronavirus 2 by RT PCR: NEGATIVE

## 2023-07-08 LAB — CBC WITH DIFFERENTIAL/PLATELET
Abs Immature Granulocytes: 0.03 10*3/uL (ref 0.00–0.07)
Basophils Absolute: 0 10*3/uL (ref 0.0–0.1)
Basophils Relative: 0 %
Eosinophils Absolute: 0.1 10*3/uL (ref 0.0–0.5)
Eosinophils Relative: 1 %
HCT: 43.8 % (ref 39.0–52.0)
Hemoglobin: 14 g/dL (ref 13.0–17.0)
Immature Granulocytes: 0 %
Lymphocytes Relative: 15 %
Lymphs Abs: 1.5 10*3/uL (ref 0.7–4.0)
MCH: 30.4 pg (ref 26.0–34.0)
MCHC: 32 g/dL (ref 30.0–36.0)
MCV: 95 fL (ref 80.0–100.0)
Monocytes Absolute: 1.2 10*3/uL — ABNORMAL HIGH (ref 0.1–1.0)
Monocytes Relative: 12 %
Neutro Abs: 6.9 10*3/uL (ref 1.7–7.7)
Neutrophils Relative %: 72 %
Platelets: 150 10*3/uL (ref 150–400)
RBC: 4.61 MIL/uL (ref 4.22–5.81)
RDW: 15.3 % (ref 11.5–15.5)
WBC: 9.7 10*3/uL (ref 4.0–10.5)
nRBC: 0 % (ref 0.0–0.2)

## 2023-07-08 LAB — BLOOD GAS, ARTERIAL
Acid-Base Excess: 1 mmol/L (ref 0.0–2.0)
Bicarbonate: 27.1 mmol/L (ref 20.0–28.0)
O2 Saturation: 99.1 %
Patient temperature: 36.6
pCO2 arterial: 47 mm[Hg] (ref 32–48)
pH, Arterial: 7.37 (ref 7.35–7.45)
pO2, Arterial: 127 mm[Hg] — ABNORMAL HIGH (ref 83–108)

## 2023-07-08 LAB — BRAIN NATRIURETIC PEPTIDE: B Natriuretic Peptide: 258 pg/mL — ABNORMAL HIGH (ref 0.0–100.0)

## 2023-07-08 LAB — TROPONIN I (HIGH SENSITIVITY)
Troponin I (High Sensitivity): 23 ng/L — ABNORMAL HIGH (ref <18)
Troponin I (High Sensitivity): 27 ng/L — ABNORMAL HIGH (ref <18)

## 2023-07-08 MED ORDER — ASPIRIN 81 MG PO CHEW
81.0000 mg | CHEWABLE_TABLET | Freq: Two times a day (BID) | ORAL | Status: DC
  Administered 2023-07-08 – 2023-07-13 (×10): 81 mg via ORAL
  Filled 2023-07-08 (×10): qty 1

## 2023-07-08 MED ORDER — ALBUTEROL SULFATE HFA 108 (90 BASE) MCG/ACT IN AERS: 2.0000 | INHALATION_SPRAY | RESPIRATORY_TRACT | Status: DC | PRN

## 2023-07-08 MED ORDER — SODIUM BICARBONATE 650 MG PO TABS
650.0000 mg | ORAL_TABLET | Freq: Three times a day (TID) | ORAL | Status: DC
Start: 2023-07-08 — End: 2023-07-13
  Administered 2023-07-08 – 2023-07-13 (×14): 650 mg via ORAL
  Filled 2023-07-08 (×14): qty 1

## 2023-07-08 MED ORDER — CALCITRIOL 0.25 MCG PO CAPS
0.2500 ug | ORAL_CAPSULE | ORAL | Status: DC
  Administered 2023-07-10 – 2023-07-13 (×2): 0.25 ug via ORAL
  Filled 2023-07-08 (×2): qty 1

## 2023-07-08 MED ORDER — IPRATROPIUM-ALBUTEROL 0.5-2.5 (3) MG/3ML IN SOLN
RESPIRATORY_TRACT | Status: AC
Start: 2023-07-08 — End: 2023-07-08
  Administered 2023-07-08: 3 mL via RESPIRATORY_TRACT
  Filled 2023-07-08: qty 3

## 2023-07-08 MED ORDER — EZETIMIBE 10 MG PO TABS
10.0000 mg | ORAL_TABLET | Freq: Every day | ORAL | Status: DC
  Administered 2023-07-09 – 2023-07-13 (×5): 10 mg via ORAL
  Filled 2023-07-08 (×5): qty 1

## 2023-07-08 MED ORDER — METHYLPREDNISOLONE SODIUM SUCC 125 MG IJ SOLR
125.0000 mg | Freq: Every day | INTRAMUSCULAR | Status: DC
  Administered 2023-07-09: 125 mg via INTRAVENOUS
  Filled 2023-07-08: qty 2

## 2023-07-08 MED ORDER — IPRATROPIUM-ALBUTEROL 0.5-2.5 (3) MG/3ML IN SOLN
3.0000 mL | RESPIRATORY_TRACT | Status: DC
  Administered 2023-07-08 – 2023-07-11 (×16): 3 mL via RESPIRATORY_TRACT
  Filled 2023-07-08 (×17): qty 3

## 2023-07-08 MED ORDER — CEFTRIAXONE SODIUM 2 G IJ SOLR
2.0000 g | INTRAMUSCULAR | Status: DC
  Administered 2023-07-09 – 2023-07-12 (×4): 2 g via INTRAVENOUS
  Filled 2023-07-08 (×4): qty 20

## 2023-07-08 MED ORDER — PREGABALIN 75 MG PO CAPS
200.0000 mg | ORAL_CAPSULE | Freq: Two times a day (BID) | ORAL | Status: DC
  Administered 2023-07-08 – 2023-07-13 (×10): 200 mg via ORAL
  Filled 2023-07-08 (×10): qty 1

## 2023-07-08 MED ORDER — PANTOPRAZOLE SODIUM 40 MG PO TBEC
40.0000 mg | DELAYED_RELEASE_TABLET | Freq: Every day | ORAL | Status: DC
  Administered 2023-07-08 – 2023-07-13 (×6): 40 mg via ORAL
  Filled 2023-07-08 (×7): qty 1

## 2023-07-08 MED ORDER — ACETAMINOPHEN 650 MG RE SUPP: 650.0000 mg | Freq: Four times a day (QID) | RECTAL | Status: DC | PRN

## 2023-07-08 MED ORDER — HEPARIN SODIUM (PORCINE) 5000 UNIT/ML IJ SOLN
5000.0000 [IU] | Freq: Three times a day (TID) | INTRAMUSCULAR | Status: DC
  Administered 2023-07-09 – 2023-07-13 (×13): 5000 [IU] via SUBCUTANEOUS
  Filled 2023-07-08 (×13): qty 1

## 2023-07-08 MED ORDER — SODIUM CHLORIDE 0.9 % IV SOLN
1.0000 g | Freq: Once | INTRAVENOUS | Status: AC
  Administered 2023-07-08: 1 g via INTRAVENOUS
  Filled 2023-07-08: qty 10

## 2023-07-08 MED ORDER — ALBUTEROL SULFATE (2.5 MG/3ML) 0.083% IN NEBU
2.5000 mg | INHALATION_SOLUTION | RESPIRATORY_TRACT | Status: DC | PRN
  Administered 2023-07-09: 2.5 mg via RESPIRATORY_TRACT
  Filled 2023-07-08: qty 3

## 2023-07-08 MED ORDER — ROSUVASTATIN CALCIUM 10 MG PO TABS
10.0000 mg | ORAL_TABLET | Freq: Every day | ORAL | Status: DC
  Administered 2023-07-09 – 2023-07-13 (×5): 10 mg via ORAL
  Filled 2023-07-08 (×5): qty 1

## 2023-07-08 MED ORDER — LATANOPROST 0.005 % OP SOLN
1.0000 [drp] | Freq: Every day | OPHTHALMIC | Status: DC
  Administered 2023-07-08 – 2023-07-12 (×5): 1 [drp] via OPHTHALMIC
  Filled 2023-07-08: qty 2.5

## 2023-07-08 MED ORDER — ATROPINE SULFATE 1 % OP SOLN
1.0000 [drp] | Freq: Three times a day (TID) | OPHTHALMIC | Status: DC
  Administered 2023-07-09: 1 [drp] via OPHTHALMIC
  Filled 2023-07-08: qty 5
  Filled 2023-07-08: qty 2

## 2023-07-08 MED ORDER — ACETAMINOPHEN 325 MG PO TABS: 650.0000 mg | ORAL_TABLET | Freq: Four times a day (QID) | ORAL | Status: DC | PRN

## 2023-07-08 MED ORDER — SODIUM CHLORIDE 0.9 % IV SOLN
500.0000 mg | INTRAVENOUS | Status: DC
  Administered 2023-07-09 – 2023-07-10 (×2): 500 mg via INTRAVENOUS
  Filled 2023-07-08 (×2): qty 5

## 2023-07-08 MED ORDER — METHYLPREDNISOLONE SODIUM SUCC 125 MG IJ SOLR
125.0000 mg | Freq: Once | INTRAMUSCULAR | Status: AC
  Administered 2023-07-08: 125 mg via INTRAVENOUS
  Filled 2023-07-08: qty 2

## 2023-07-08 MED ORDER — AMLODIPINE BESYLATE 5 MG PO TABS
10.0000 mg | ORAL_TABLET | Freq: Every day | ORAL | Status: DC
  Administered 2023-07-09 – 2023-07-13 (×5): 10 mg via ORAL
  Filled 2023-07-08 (×5): qty 2

## 2023-07-08 MED ORDER — SODIUM CHLORIDE 0.9 % IV SOLN
500.0000 mg | Freq: Once | INTRAVENOUS | Status: AC
  Administered 2023-07-08: 500 mg via INTRAVENOUS
  Filled 2023-07-08: qty 5

## 2023-07-08 MED ORDER — FUROSEMIDE 20 MG PO TABS
20.0000 mg | ORAL_TABLET | Freq: Every day | ORAL | Status: DC
  Administered 2023-07-09 – 2023-07-10 (×2): 20 mg via ORAL
  Filled 2023-07-08 (×3): qty 1

## 2023-07-08 MED ORDER — HYDRALAZINE HCL 20 MG/ML IJ SOLN: 5.0000 mg | INTRAMUSCULAR | Status: DC | PRN

## 2023-07-08 MED ORDER — TOBRAMYCIN 0.3 % OP SOLN
1.0000 [drp] | Freq: Four times a day (QID) | OPHTHALMIC | Status: DC
  Filled 2023-07-08: qty 5

## 2023-07-08 MED ORDER — IOHEXOL 350 MG/ML SOLN
60.0000 mL | Freq: Once | INTRAVENOUS | Status: AC | PRN
  Administered 2023-07-08: 60 mL via INTRAVENOUS

## 2023-07-08 MED ORDER — METOPROLOL SUCCINATE ER 25 MG PO TB24
25.0000 mg | ORAL_TABLET | Freq: Every day | ORAL | Status: DC
  Administered 2023-07-09 – 2023-07-13 (×5): 25 mg via ORAL
  Filled 2023-07-08 (×5): qty 1

## 2023-07-08 MED ORDER — IPRATROPIUM-ALBUTEROL 0.5-2.5 (3) MG/3ML IN SOLN
3.0000 mL | Freq: Once | RESPIRATORY_TRACT | Status: AC
Start: 2023-07-08 — End: 2023-07-08
  Administered 2023-07-08: 3 mL via RESPIRATORY_TRACT

## 2023-07-08 MED ORDER — HYDRALAZINE HCL 25 MG PO TABS
25.0000 mg | ORAL_TABLET | Freq: Two times a day (BID) | ORAL | Status: DC
  Administered 2023-07-08 – 2023-07-13 (×10): 25 mg via ORAL
  Filled 2023-07-08 (×12): qty 1

## 2023-07-08 NOTE — ED Provider Notes (Signed)
Care of patient assumed from Dr. Maple Hudson.  This patient presents for shortness of breath.  He has new hypoxia, identified at PCPs office.  SpO2 on arrival in the ED was 70% on room air.  He has been treated for COPD.  He was initially on nonrebreather but has been weaned down to 3 L.  He is awaiting results of CTA.  He will require admission. Physical Exam  BP 135/69   Pulse 71   Temp 97.9 F (36.6 C) (Oral)   Resp 20   Ht 5' 9.5" (1.765 m)   Wt 98.4 kg   SpO2 93%   BMI 31.58 kg/m   Physical Exam Vitals and nursing note reviewed.  Constitutional:      General: He is not in acute distress.    Appearance: He is well-developed. He is not ill-appearing, toxic-appearing or diaphoretic.  HENT:     Head: Normocephalic and atraumatic.     Mouth/Throat:     Mouth: Mucous membranes are moist.  Eyes:     Conjunctiva/sclera: Conjunctivae normal.  Cardiovascular:     Rate and Rhythm: Normal rate and regular rhythm.     Heart sounds: No murmur heard. Pulmonary:     Effort: Pulmonary effort is normal. No tachypnea or respiratory distress.     Breath sounds: Decreased breath sounds present. No wheezing or rhonchi.  Chest:     Chest wall: No tenderness.  Abdominal:     Palpations: Abdomen is soft.     Tenderness: There is no abdominal tenderness.  Musculoskeletal:        General: No swelling. Normal range of motion.     Cervical back: Normal range of motion and neck supple.  Skin:    General: Skin is warm and dry.  Neurological:     General: No focal deficit present.     Mental Status: He is alert and oriented to person, place, and time.  Psychiatric:        Mood and Affect: Mood normal.        Behavior: Behavior normal.     Procedures  Procedures  ED Course / MDM   Clinical Course as of 07/08/23 1541  Wed Jul 08, 2023  1303 CBC with Differential(!) No leukocytosis to suggest systemic infection.  No anemia that would explain his hypoxia [TY]  1317 Resp panel by RT-PCR (RSV, Flu  A&B, Covid) Anterior Nasal Swab Negative [TY]    Clinical Course User Index [TY] Coral Spikes, DO   Medical Decision Making Amount and/or Complexity of Data Reviewed Labs: ordered. Decision-making details documented in ED Course. Radiology: ordered.  Risk Prescription drug management. Decision regarding hospitalization.   On assessment, patient resting comfortably.  Work of breathing is now increased.  He remains on 4 L of supplemental oxygen with SpO2 of 92%.  CTA showed evidence of multifocal pneumonia.  Antibiotics were ordered.  Patient was admitted for further management.       Gloris Manchester, MD 07/08/23 [phone]

## 2023-07-08 NOTE — ED Triage Notes (Signed)
Pt arrived via POV from his PCP Office who reports Pt was hypoxic and needed a higher level of care. Pt presents 57% on room air. Pt placed on 4L Nasal Cannula with minimal improvement. Pt has cough and headache. Pt placed on 15L NRB.

## 2023-07-08 NOTE — Assessment & Plan Note (Signed)
Has recent worsening of dyspnea and wheezing Hypoxia on initial evaluation today, advised to go to ER for further evaluation At baseline, has mild exertional dyspnea likely due to chronic bronchitis Remote smoking history Has albuterol as needed for dyspnea

## 2023-07-08 NOTE — Assessment & Plan Note (Signed)
Has hypoxia currently, will need EKG and troponin in ER S/p stent placement On aspirin and statin On Metoprolol Denies chest pain currently

## 2023-07-08 NOTE — Assessment & Plan Note (Signed)
Last BMP reviewed -shows declining GFR, needs repeat BMP Followed by nephrologist On sodium bicarb and Lasix - stopped lisinopril due to hyperkalemia On Jardiance twice weekly for proteinuria

## 2023-07-08 NOTE — Assessment & Plan Note (Signed)
On Lyrica 200 mg BID Better controlled now

## 2023-07-08 NOTE — Patient Instructions (Signed)
Please go to ER for evaluation of hypoxia (low oxygen) and acute bronchitis.

## 2023-07-08 NOTE — Progress Notes (Signed)
Established Patient Office Visit  Subjective:  Patient ID: Kyle Patel., male    DOB: 1946-03-29  Age: 78 y.o. MRN: [id number]  CC:  Chief Complaint  Patient presents with   Care Management    3 month f/u    HPI Kyle Paternoster. is a 78 y.o. male with past medical history of CAD, HTN, CKD, peripheral neuropathy and HLD who presents for f/u of his chronic medical conditions.  He presents for routine visit, but is acutely hypoxic upon evaluation.  His O2 sat was in 70s upon initial evaluation, was placed on 3 L O2, but his O2 sat improved up to 80s.  He has shortness of breath and audible wheezing.  He reports feeling fatigued and has dyspnea for the last 1 month, but did not get any medical evaluation.  Denies any fever, chills, nausea, vomiting.  He has had hospitalization in 12/23 for acute hypoxic respiratory failure in the setting of acute bronchitis and postop atelectasis.  He has albuterol inhaler for as needed dyspnea or wheezing.   HTN and CAD: He has had stent placed for history of NSTEMI in 2018.  He is on aspirin and statin currently. BP is wnl today. Takes medications regularly. Patient denies headache, dizziness, chest pain, or palpitations.  CKD stage IIIb: Followed by Dr. Wolfgang Phoenix.  His last BMP showed GFR of 32 in 11/24, which was lower compared to his baseline. He has seen Dr. Wolfgang Phoenix since then. He currently denies any dysuria, hematuria, urinary hesitance or resistance.  Type II DM: He is currently on Jardiance 10 mg - twice in a week for CKD.  His HbA1c was 6.4 in 11/24.  He denies polyuria or polyphagia currently.  He has history of peripheral neuropathy, for which he takes Lyrica 200 mg twice daily.  He has chronic burning pain in his feet and also complains of intermittent weakness of the LE, but have improved since increasing dose of Lyrica.  Denies any recent fall.   Past Medical History:  Diagnosis Date   Acute anterior wall MI (HCC) 04/24/2017    Arthritis    AVN of femur (HCC)    CAD (coronary artery disease)    a. 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   Hyperlipidemia    Pulmonary embolism (HCC) 10/2011    Past Surgical History:  Procedure Laterality Date   ABDOMINAL SURGERY     colon polyp removed 2007- sepsis, coma for 4 months per pt   BIOPSY  12/24/2020   Procedure: BIOPSY;  Surgeon: Lanelle Bal, DO;  Location: [facility];  Service: Endoscopy;;   CATARACT EXTRACTION W/PHACO Right 08/19/2021   Procedure: CATARACT EXTRACTION PHACO AND INTRAOCULAR LENS PLACEMENT (IOC);  Surgeon: Fabio Pierce, MD;  Location: [facility];  Service: Ophthalmology;  Laterality: Right;  CDE: 7.02    CATARACT EXTRACTION W/PHACO Left 09/30/2021   Procedure: CATARACT EXTRACTION PHACO AND INTRAOCULAR LENS PLACEMENT (IOC);  Surgeon: Fabio Pierce, MD;  Location: [facility];  Service: Ophthalmology;  Laterality: Left;  CDE 4.61   COLONOSCOPY  03/22/2010   RMR: 1. Normal rectum 2. Diminutive polyp at the mouth of the ileocecal valve  status post cold biopsy removal. Remaideer of the colonic mucosa and terminal ileum mucosa appeared normal.  COLONOSCOPY N/A 06/25/2015   Procedure: COLONOSCOPY;  Surgeon: Corbin Ade, MD;  Location: [facility];  Service: Endoscopy;  Laterality: N/A;  200-rescheduled 1/30 per Ginger    COLONOSCOPY WITH PROPOFOL N/A 12/24/2020   Procedure: COLONOSCOPY WITH PROPOFOL;  Surgeon: Lanelle Bal, DO;  Location: [facility];  Service: Endoscopy;  Laterality: N/A;  ASA III / 8:00   CORONARY BALLOON ANGIOPLASTY N/A 04/24/2017   Procedure: CORONARY BALLOON ANGIOPLASTY;  Surgeon: Corky Crafts, MD;  Location: [facility];  Service: Cardiovascular;  Laterality: N/A;   LEFT HEART CATH AND  CORONARY ANGIOGRAPHY N/A 04/24/2017   Procedure: LEFT HEART CATH AND CORONARY ANGIOGRAPHY;  Surgeon: Corky Crafts, MD;  Location: [facility];  Service: Cardiovascular;  Laterality: N/A;   LUMBAR LAMINECTOMY  06/02/2012   LUMBAR LAMINECTOMY/DECOMPRESSION MICRODISCECTOMY  06/02/2012   Procedure: LUMBAR LAMINECTOMY/DECOMPRESSION MICRODISCECTOMY 1 LEVEL;  Surgeon: Karn Cassis, MD;  Location: [facility];  Service: Neurosurgery;  Laterality: Bilateral;  Bilateral Lumbar five-sacral one Foraminotomy and microdiskectomy    POLYPECTOMY  12/24/2020   Procedure: POLYPECTOMY;  Surgeon: Lanelle Bal, DO;  Location: [facility];  Service: Endoscopy;;   TOTAL KNEE ARTHROPLASTY Right 05/16/2022   Procedure: TOTAL KNEE ARTHROPLASTY;  Surgeon: Frederico Hamman, MD;  Location: [facility];  Service: Orthopedics;  Laterality: Right;    Family History  Problem Relation Age of Onset   Heart attack Mother    Diabetes Mother    Stroke Father    Heart attack Father    Arthritis Other    Colon cancer Sister 36       Estimated age of onset; just found out about it in 18.   Neuropathy Neg Hx     Social History   Socioeconomic History   Marital status: Married    Spouse name: Not on file   Number of children: 1   Years of education: Not on file   Highest education level: Not on file  Occupational History   Occupation: retired  Tobacco Use   Smoking status: Former    Current packs/day: 0.00    Average packs/day: 0.1 packs/day for 20.0 years (2.0 ttl pk-yrs)    Types: Cigarettes    Start date: 05/08/1979    Quit date: 05/08/1999    Years since quitting: 24.1   Smokeless tobacco: Never  Vaping Use   Vaping status: Never Used  Substance and Sexual Activity   Alcohol use: No   Drug use: No   Sexual activity: Yes  Other Topics Concern   Not on file  Social History Narrative   Patient is right handed.   Patient drinks caffeine occasionally   Social Drivers of Manufacturing engineer Strain: Low Risk  (10/29/2021)   Overall Financial Resource Strain (CARDIA)    Difficulty of Paying Living Expenses: Not hard at all  Food Insecurity: Patient Declined (05/16/2022)   Hunger Vital Sign    Worried About Running Out of Food in the Last Year: Patient declined    Ran Out of Food in the Last Year: Patient declined  Transportation Needs: No Transportation Needs (05/16/2022)   PRAPARE - Administrator, Civil Service (Medical): No    Lack of Transportation (Non-Medical): No  Physical Activity: Inactive (10/29/2021)   Exercise Vital Sign    Days of Exercise per Week: 0 days    Minutes of Exercise per Session: 0 min  Stress: No Stress Concern Present (10/29/2021)  [employer] of Occupational Health - Occupational Stress Questionnaire    Feeling of Stress : Not at all  Social Connections: Socially Integrated (10/29/2021)   Social Connection and Isolation Panel [NHANES]    Frequency of Communication with Friends and Family: More than three times a week    Frequency of Social Gatherings with Friends and Family: More than three times a week    Attends Religious Services: More than 4 times per year    Active Member of [employer] or Organizations: Yes    Attends Engineer, structural: More than 4 times per year    Marital Status: Married  Catering manager Violence: Not At Risk (05/16/2022)   Humiliation, Afraid, Rape, and Kick questionnaire    Fear of Current or Ex-Partner: No    Emotionally Abused: No    Physically Abused: No    Sexually Abused: No    Outpatient Medications Prior to Visit  Medication Sig Dispense Refill   albuterol (VENTOLIN HFA) 108 (90 Base) MCG/ACT inhaler Inhale 2 puffs into the lungs every 6 (six) hours as needed for wheezing or shortness of breath. 18 g 3   amLODipine (NORVASC) 10 MG tablet TAKE ONE TABLET BY MOUTH ONCE DAILY. 90 tablet 2   aspirin 81 MG chewable tablet Chew 81 mg by mouth 2 (two) times daily.      atropine 1 % ophthalmic solution Apply to eye.     brimonidine (ALPHAGAN) 0.2 % ophthalmic solution Place 1 drop into the right eye 3 (three) times daily.     calcitRIOL (ROCALTROL) 0.25 MCG capsule Take 0.25 mcg by mouth every Monday, Wednesday, and Friday.     Coenzyme Q10 100 MG TABS Take 1 tablet by mouth daily.     Coenzyme Q10-Vitamin E 100-150 MG-UNIT CAPS Take 1 capsule by mouth at bedtime.     COLACE 100 MG capsule SMARTSIG:1-3 Capsule(s) By Mouth PRN     dorzolamide-timolol (COSOPT) 2-0.5 % ophthalmic solution Place 1 drop into the right eye 2 (two) times daily.     ezetimibe (ZETIA) 10 MG tablet TAKE (1) TABLET BY MOUTH ONCE DAILY. 90 tablet 3   furosemide (LASIX) 20 MG tablet TAKE 1 TABLET BY MOUTH ONCE DAILY. 90 tablet 2   hydrALAZINE (APRESOLINE) 25 MG tablet TAKE (1) TABLET BY MOUTH TWICE DAILY. 180 tablet 1   JARDIANCE 10 MG TABS tablet Take 10 mg by mouth 2 (two) times a week. Monday & Friday only-start after farxiga completed     latanoprost (XALATAN) 0.005 % ophthalmic solution Place 1 drop into the left eye at bedtime.     lisinopril (ZESTRIL) 10 MG tablet Take 1 tablet (10 mg total) by mouth daily.     metoprolol succinate (TOPROL-XL) 50 MG 24 hr tablet TAKE (1) TABLET BY MOUTH ONCE DAILY. TAKE WITH OR IMMEDIATELY FOLLOWING A MEAL. 90 tablet 3   Misc Natural Products (OSTEO BI-FLEX ADV JOINT SHIELD) TABS Take 1 tablet by mouth daily.     Multiple Vitamins-Minerals (CENTRUM PO) Take 1 tablet by mouth every morning.     nitroGLYCERIN (NITROSTAT) 0.4 MG SL tablet PLACE 1 TAB UNDER TONGUE EVERY 5 MIN IF NEEDED FOR CHEST PAIN. MAY USE 3 TIMES.NO RELIEF CALL 911. 25 tablet 3   oxyCODONE (OXY IR/ROXICODONE) 5 MG immediate release tablet Take 5 mg by mouth 4 (four) times daily as needed.     pregabalin (LYRICA) 200 MG capsule Take 1 capsule (200 mg total) by mouth 2 (two) times daily. 60 capsule 5  rosuvastatin (CRESTOR) 10 MG tablet TAKE (1) TABLET BY MOUTH ONCE DAILY. 90 tablet 1    sodium bicarbonate 650 MG tablet Take 650 mg by mouth 3 (three) times daily.     sodium zirconium cyclosilicate (LOKELMA) 10 g PACK packet Take 10 g by mouth daily. 3 each 0   terbinafine (LAMISIL AT) 1 % cream Apply 1 Application topically 2 (two) times daily. 30 g 0   tobramycin (TOBREX) 0.3 % ophthalmic solution Apply to eye.     No facility-administered medications prior to visit.    Allergies  Allergen Reactions   Levaquin [Levofloxacin] Itching   Statins     Myalgias    ROS Review of Systems  Constitutional:  Positive for fatigue. Negative for chills and fever.  HENT:  Negative for congestion and sore throat.   Eyes:  Negative for pain and discharge.  Respiratory:  Positive for shortness of breath (Intermittent, upon exertion) and wheezing. Negative for cough.   Cardiovascular:  Negative for chest pain and palpitations.  Gastrointestinal:  Negative for diarrhea, nausea and vomiting.  Endocrine: Negative for polydipsia and polyuria.  Genitourinary:  Negative for dysuria and hematuria.  Musculoskeletal:  Negative for neck pain and neck stiffness.  Skin:  Negative for rash.  Neurological:  Positive for weakness and numbness. Negative for dizziness and headaches.  Psychiatric/Behavioral:  Negative for agitation and behavioral problems.       Objective:    Physical Exam Vitals reviewed.  Constitutional:      General: He is not in acute distress.    Appearance: He is not diaphoretic.  HENT:     Head: Normocephalic and atraumatic.     Nose: Nose normal.     Mouth/Throat:     Mouth: Mucous membranes are moist.  Eyes:     General: No scleral icterus.    Extraocular Movements: Extraocular movements intact.  Cardiovascular:     Rate and Rhythm: Normal rate and regular rhythm.     Pulses: Normal pulses.     Heart sounds: Normal heart sounds. No murmur heard. Pulmonary:     Breath sounds: Wheezing (Diffuse) present. No rales.     Comments: On 3 l O2 in  office Musculoskeletal:     Cervical back: Neck supple. No tenderness.     Right lower leg: No edema.     Left lower leg: No edema.  Skin:    General: Skin is warm.     Findings: No rash.  Neurological:     General: No focal deficit present.     Mental Status: He is alert and oriented to person, place, and time.     Sensory: Sensory deficit (B/l feet) present.     Motor: Weakness (4/5 in b/l LE) present.     Gait: Gait abnormal (slow).  Psychiatric:        Mood and Affect: Mood normal.        Behavior: Behavior normal.     BP 136/65   Pulse 83   Ht 5' 9.5" (1.765 m)   Wt 217 lb (98.4 kg)   BMI 31.59 kg/m  Wt Readings from Last 3 Encounters:  07/08/23 217 lb (98.4 kg)  04/06/23 216 lb (98 kg)  03/20/23 212 lb 12.8 oz (96.5 kg)    Lab Results  Component Value Date   TSH 1.63 08/08/2019   Lab Results  Component Value Date   WBC 12.2 (H) 05/19/2022   HGB 12.6 (L) 05/19/2022   HCT 38.9 (L) 05/19/2022  MCV 91.5 05/19/2022   PLT 145 (L) 05/19/2022   Lab Results  Component Value Date   NA 141 04/06/2023   K 5.3 (H) 04/06/2023   CO2 19 (L) 04/06/2023   GLUCOSE 135 (H) 04/06/2023   BUN 31 (H) 04/06/2023   CREATININE 2.12 (H) 04/06/2023   BILITOT 0.3 03/20/2023   ALKPHOS 79 03/20/2023   AST 36 03/20/2023   ALT 27 03/20/2023   PROT 7.9 03/20/2023   ALBUMIN 4.3 03/20/2023   CALCIUM 9.3 04/06/2023   ANIONGAP 6 05/19/2022   EGFR 32 (L) 04/06/2023   Lab Results  Component Value Date   CHOL 176 03/20/2023   Lab Results  Component Value Date   HDL 51 03/20/2023   Lab Results  Component Value Date   LDLCALC 102 (H) 03/20/2023   Lab Results  Component Value Date   TRIG 132 03/20/2023   Lab Results  Component Value Date   CHOLHDL 3.5 03/20/2023   Lab Results  Component Value Date   HGBA1C 6.4 (H) 03/20/2023      Assessment & Plan:   Problem List Items Addressed This Visit       Cardiovascular and Mediastinum   CAD (coronary artery disease)    Has hypoxia currently, will need EKG and troponin in ER S/p stent placement On aspirin and statin On Metoprolol Denies chest pain currently        Respiratory   Acute respiratory failure with hypoxia (HCC) - Primary   Likely from acute bronchitis Other etiologies could be PE, ACS or anemia amongst others Would need ER evaluation - may need CT chest PE protocol and CBC, CMP, ABG to evaluate further Discussed with ER physician       Acute on chronic bronchitis (HCC)   Has recent worsening of dyspnea and wheezing Hypoxia on initial evaluation today, advised to go to ER for further evaluation At baseline, has mild exertional dyspnea likely due to chronic bronchitis Remote smoking history Has albuterol as needed for dyspnea        Endocrine   Type 2 diabetes mellitus with stage 3b chronic kidney disease, without long-term current use of insulin (HCC)   Lab Results  Component Value Date   HGBA1C 6.4 (H) 03/20/2023    Well controlled On Jardiance 10 mg twice in a week - had AKI with once daily dose Advised to follow diabetic diet On statin and ACEi F/u CMP and lipid panel Diabetic eye exam: Advised to follow up with Ophthalmology for diabetic eye exam        Nervous and Auditory   Idiopathic peripheral neuropathy   On Lyrica 200 mg BID Better controlled now        Genitourinary   CKD (chronic kidney disease)   Last BMP reviewed -shows declining GFR, needs repeat BMP Followed by nephrologist On sodium bicarb and Lasix - stopped lisinopril due to hyperkalemia On Jardiance twice weekly for proteinuria         No orders of the defined types were placed in this encounter.   Follow-up: Return in about 3 weeks (around 07/29/2023) for After hospitalization.    Anabel Halon, MD

## 2023-07-08 NOTE — ED Notes (Signed)
After sitting at rest and maintaining 15L NRB, Pts O2 Sats improved to 98%.

## 2023-07-08 NOTE — Assessment & Plan Note (Signed)
Lab Results  Component Value Date   HGBA1C 6.4 (H) 03/20/2023    Well controlled On Jardiance 10 mg twice in a week - had AKI with once daily dose Advised to follow diabetic diet On statin and ACEi F/u CMP and lipid panel Diabetic eye exam: Advised to follow up with Ophthalmology for diabetic eye exam

## 2023-07-08 NOTE — H&P (Signed)
TRH H&P   Patient Demographics:    Kyle Patel, is a 78 y.o. male  MRN: [id number]   DOB - Oct 23, 1945  Admit Date - 07/08/2023  Outpatient Primary MD for the patient is Anabel Halon, MD  Referring MD/NP/PA: Dr. Durwin Nora  Patient coming from: Home  Chief Complaint  Patient presents with   Shortness of Breath      HPI:    Kyle Patel  is a 78 y.o. male,  with medical history significant for  HTN, CAD, CKD, COPD. -Patient presents to ED secondary to complaints of shortness of breath, reports feeling ill for last 2 days, reports cough, dyspnea over the last 2 days, reports chills, but no fever, he denies any chest pain, saturation was 76% on room air upon presentation to ED. -Patient was started on oxygen, initially nonrebreather, but weaned down to 5 L oxygen, CTA chest negative for PE but significant for multifocal pneumonia, patient started on steroids, antibiotics, nebulizer treatments and [facility] consulted to admit.   Review of systems:      A full 10 point Review of Systems was done, except as stated above, all other Review of Systems were negative.   With Past History of the following :    Past Medical History:  Diagnosis Date   Acute anterior wall MI (HCC) 04/24/2017   Arthritis    AVN of femur (HCC)    CAD (coronary artery disease)    a. 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   Hyperlipidemia    Pulmonary embolism (HCC) 10/2011      Past Surgical History:  Procedure Laterality Date   ABDOMINAL SURGERY     colon polyp removed 2007- sepsis, coma for 4 months per pt   BIOPSY  12/24/2020   Procedure: BIOPSY;  Surgeon: Lanelle Bal, DO;  Location: [facility];  Service: Endoscopy;;   CATARACT EXTRACTION W/PHACO Right 08/19/2021   Procedure: CATARACT EXTRACTION PHACO AND INTRAOCULAR LENS PLACEMENT (IOC);  Surgeon: Fabio Pierce, MD;  Location: [facility];  Service: Ophthalmology;  Laterality: Right;  CDE: 7.02    CATARACT EXTRACTION W/PHACO Left 09/30/2021   Procedure: CATARACT EXTRACTION PHACO AND INTRAOCULAR LENS PLACEMENT (IOC);  Surgeon: Fabio Pierce, MD;  Location: [facility];  Service: Ophthalmology;  Laterality: Left;  CDE 4.61   COLONOSCOPY  03/22/2010   RMR: 1. Normal rectum 2. Diminutive polyp at the mouth of the ileocecal valve  status post cold biopsy removal. Remaideer of the colonic mucosa and terminal ileum mucosa appeared normal.    COLONOSCOPY N/A 06/25/2015   Procedure: COLONOSCOPY;  Surgeon: Corbin Ade, MD;  Location: [facility];  Service:  Endoscopy;  Laterality: N/A;  200-rescheduled 1/30 per Ginger    COLONOSCOPY WITH PROPOFOL N/A 12/24/2020   Procedure: COLONOSCOPY WITH PROPOFOL;  Surgeon: Lanelle Bal, DO;  Location: [facility];  Service: Endoscopy;  Laterality: N/A;  ASA III / 8:00   CORONARY BALLOON ANGIOPLASTY N/A 04/24/2017   Procedure: CORONARY BALLOON ANGIOPLASTY;  Surgeon: Corky Crafts, MD;  Location: [facility];  Service: Cardiovascular;  Laterality: N/A;   LEFT HEART CATH AND CORONARY ANGIOGRAPHY N/A 04/24/2017   Procedure: LEFT HEART CATH AND CORONARY ANGIOGRAPHY;  Surgeon: Corky Crafts, MD;  Location: [facility];  Service: Cardiovascular;  Laterality: N/A;   LUMBAR LAMINECTOMY  06/02/2012   LUMBAR LAMINECTOMY/DECOMPRESSION MICRODISCECTOMY  06/02/2012   Procedure: LUMBAR LAMINECTOMY/DECOMPRESSION MICRODISCECTOMY 1 LEVEL;  Surgeon: Karn Cassis, MD;  Location: [facility];  Service: Neurosurgery;  Laterality: Bilateral;  Bilateral Lumbar five-sacral one Foraminotomy and microdiskectomy    POLYPECTOMY  12/24/2020   Procedure: POLYPECTOMY;   Surgeon: Lanelle Bal, DO;  Location: [facility];  Service: Endoscopy;;   TOTAL KNEE ARTHROPLASTY Right 05/16/2022   Procedure: TOTAL KNEE ARTHROPLASTY;  Surgeon: Frederico Hamman, MD;  Location: [facility];  Service: Orthopedics;  Laterality: Right;      Social History:     Social History   Tobacco Use   Smoking status: Former    Current packs/day: 0.00    Average packs/day: 0.1 packs/day for 20.0 years (2.0 ttl pk-yrs)    Types: Cigarettes    Start date: 05/08/1979    Quit date: 05/08/1999    Years since quitting: 24.1   Smokeless tobacco: Never  Substance Use Topics   Alcohol use: No        Family History :     Family History  Problem Relation Age of Onset   Heart attack Mother    Diabetes Mother    Stroke Father    Heart attack Father    Arthritis Other    Colon cancer Sister 38       Estimated age of onset; just found out about it in 45.   Neuropathy Neg Hx      Home Medications:   Prior to Admission medications   Medication Sig Start Date End Date Taking? Authorizing Provider  albuterol (VENTOLIN HFA) 108 (90 Base) MCG/ACT inhaler Inhale 2 puffs into the lungs every 6 (six) hours as needed for wheezing or shortness of breath. 09/11/22   Anabel Halon, MD  amLODipine (NORVASC) 10 MG tablet TAKE ONE TABLET BY MOUTH ONCE DAILY. 05/15/23   Jonelle Sidle, MD  aspirin 81 MG chewable tablet Chew 81 mg by mouth 2 (two) times daily. 05/20/22   [provider]  atropine 1 % ophthalmic solution Apply to eye.    [provider]  brimonidine (ALPHAGAN) 0.2 % ophthalmic solution Place 1 drop into the right eye 3 (three) times daily. 01/09/23   [provider]  calcitRIOL (ROCALTROL) 0.25 MCG capsule Take 0.25 mcg by mouth every Monday, Wednesday, and Friday. 07/09/20   [provider]  Coenzyme Q10 100 MG TABS Take 1 tablet by mouth daily. 03/26/21   [provider]  Coenzyme Q10-Vitamin E 100-150 MG-UNIT CAPS Take 1  capsule by mouth at bedtime.    [provider]  COLACE 100 MG capsule SMARTSIG:1-3 Capsule(s) By Mouth PRN 05/18/22   [provider]  dorzolamide-timolol (COSOPT) 2-0.5 % ophthalmic solution Place 1 drop into the right eye 2 (two) times  daily. 01/09/23   [provider]  ezetimibe (ZETIA) 10 MG tablet TAKE (1) TABLET BY MOUTH ONCE DAILY. 03/17/23   Jonelle Sidle, MD  furosemide (LASIX) 20 MG tablet TAKE 1 TABLET BY MOUTH ONCE DAILY. 09/19/22   Jonelle Sidle, MD  hydrALAZINE (APRESOLINE) 25 MG tablet TAKE (1) TABLET BY MOUTH TWICE DAILY. 05/15/23   Anabel Halon, MD  JARDIANCE 10 MG TABS tablet Take 10 mg by mouth 2 (two) times a week. Monday & Friday only-start after farxiga completed 01/30/23   [provider]  latanoprost (XALATAN) 0.005 % ophthalmic solution Place 1 drop into the left eye at bedtime. 12/19/22   [provider]  lisinopril (ZESTRIL) 10 MG tablet Take 1 tablet (10 mg total) by mouth daily. 05/21/22   Johnson, Clanford L, MD  metoprolol succinate (TOPROL-XL) 50 MG 24 hr tablet TAKE (1) TABLET BY MOUTH ONCE DAILY. TAKE WITH OR IMMEDIATELY FOLLOWING A MEAL. 04/14/23   Jonelle Sidle, MD  Misc Natural Products (OSTEO BI-FLEX ADV JOINT SHIELD) TABS Take 1 tablet by mouth daily.    [provider]  Multiple Vitamins-Minerals (CENTRUM PO) Take 1 tablet by mouth every morning.    [provider]  nitroGLYCERIN (NITROSTAT) 0.4 MG SL tablet PLACE 1 TAB UNDER TONGUE EVERY 5 MIN IF NEEDED FOR CHEST PAIN. MAY USE 3 TIMES.NO RELIEF CALL 911. 07/17/22   Jonelle Sidle, MD  oxyCODONE (OXY IR/ROXICODONE) 5 MG immediate release tablet Take 5 mg by mouth 4 (four) times daily as needed. 05/29/22   [provider]  pregabalin (LYRICA) 200 MG capsule Take 1 capsule (200 mg total) by mouth 2 (two) times daily. 02/12/23   Anabel Halon, MD  rosuvastatin (CRESTOR) 10 MG tablet TAKE (1) TABLET BY MOUTH ONCE DAILY. 06/01/23    Jonelle Sidle, MD  sodium bicarbonate 650 MG tablet Take 650 mg by mouth 3 (three) times daily. 06/20/20   [provider]  sodium zirconium cyclosilicate (LOKELMA) 10 g PACK packet Take 10 g by mouth daily. 03/23/23   Anabel Halon, MD  terbinafine (LAMISIL AT) 1 % cream Apply 1 Application topically 2 (two) times daily. 03/20/23   Anabel Halon, MD  tobramycin (TOBREX) 0.3 % ophthalmic solution Apply to eye.    [provider]     Allergies:     Allergies  Allergen Reactions   Levaquin [Levofloxacin] Itching   Statins Other (See Comments)    Myalgias     Physical Exam:   Vitals  Blood pressure 135/69, pulse 83, temperature 98.3 F (36.8 C), temperature source Oral, resp. rate 16, height 5' 9.5" (1.765 m), weight 98.4 kg, SpO2 92%.   1. General Elderly male, laying in bed, no apparent distress  2. Normal affect and insight, Not Suicidal or Homicidal, Awake Alert, Oriented X 3.  3. No F.N deficits, ALL C.Nerves Intact, Strength 5/5 all 4 extremities, Sensation intact all 4 extremities, Plantars down going.  4. Ears and Eyes appear Normal, Conjunctivae clear, PERRLA. Moist Oral Mucosa.  5. Supple Neck, No JVD, No cervical lymphadenopathy appriciated, No Carotid Bruits.  6. Symmetrical Chest wall movement, air entry bilaterally, with wheezing,  7. RRR, No Gallops, Rubs or Murmurs, No Parasternal Heave.  8. Positive Bowel Sounds, Abdomen Soft, No tenderness, No organomegaly appriciated,No rebound -guarding or rigidity.  9.  No Cyanosis, Normal Skin Turgor, No Skin Rash or Bruise.  10. Good muscle tone,  joints appear normal , no effusions, Normal ROM.  Data Review:    CBC Recent Labs  Lab 07/08/23 1226  WBC 9.7  HGB 14.0  HCT 43.8  PLT 150  MCV 95.0  MCH 30.4  MCHC 32.0  RDW 15.3  LYMPHSABS 1.5  MONOABS 1.2*  EOSABS 0.1  BASOSABS 0.0    ------------------------------------------------------------------------------------------------------------------  Chemistries  Recent Labs  Lab 07/08/23 1226  NA 136  K 4.6  CL 103  CO2 24  GLUCOSE 115*  BUN 38*  CREATININE 2.10*  CALCIUM 9.1  AST 29  ALT 21  ALKPHOS 55  BILITOT 0.9   ------------------------------------------------------------------------------------------------------------------ estimated creatinine clearance is 35.5 mL/min (A) (by C-G formula based on SCr of 2.1 mg/dL (H)). ------------------------------------------------------------------------------------------------------------------ No results for input(s): "TSH", "T4TOTAL", "T3FREE", "THYROIDAB" in the last 72 hours.  Invalid input(s): "FREET3"  Coagulation profile No results for input(s): "INR", "PROTIME" in the last 168 hours. ------------------------------------------------------------------------------------------------------------------- No results for input(s): "DDIMER" in the last 72 hours. -------------------------------------------------------------------------------------------------------------------  Cardiac Enzymes No results for input(s): "CKMB", "TROPONINI", "MYOGLOBIN" in the last 168 hours.  Invalid input(s): "CK" ------------------------------------------------------------------------------------------------------------------    Component Value Date/Time   BNP 258.0 (H) 07/08/2023 1226     ---------------------------------------------------------------------------------------------------------------  Urinalysis    Component Value Date/Time   COLORURINE YELLOW 04/24/2017 0412   APPEARANCEUR CLEAR 04/24/2017 0412   LABSPEC 1.018 04/24/2017 0412   PHURINE 5.0 04/24/2017 0412   GLUCOSEU NEGATIVE 04/24/2017 0412   HGBUR SMALL (A) 04/24/2017 0412   BILIRUBINUR NEGATIVE 04/24/2017 0412   KETONESUR NEGATIVE 04/24/2017 0412   PROTEINUR 100 (A) 04/24/2017 0412    UROBILINOGEN 1.0 11/12/2011 1357   NITRITE NEGATIVE 04/24/2017 0412   LEUKOCYTESUR NEGATIVE 04/24/2017 0412    ----------------------------------------------------------------------------------------------------------------   Imaging Results:    CT Angio Chest PE W and/or Wo Contrast Result Date: 07/08/2023 CLINICAL DATA:  Hypoxia cough EXAM: CT ANGIOGRAPHY CHEST WITH CONTRAST TECHNIQUE: Multidetector CT imaging of the chest was performed using the standard protocol during bolus administration of intravenous contrast. Multiplanar CT image reconstructions and MIPs were obtained to evaluate the vascular anatomy. RADIATION DOSE REDUCTION: This exam was performed according to the departmental dose-optimization program which includes automated exposure control, adjustment of the mA and/or kV according to patient size and/or use of iterative reconstruction technique. CONTRAST:  60mL OMNIPAQUE IOHEXOL 350 MG/ML SOLN COMPARISON:  Chest x-ray 07/08/2023, CT chest 05/18/2022 FINDINGS: Cardiovascular: Satisfactory opacification of the pulmonary arteries to the segmental level. No evidence of pulmonary embolism. Nonaneurysmal aorta. No dissection. Mild atherosclerosis. Mild coronary vascular calcification. Mediastinum/Nodes: Patent trachea. No thyroid mass. Mild mediastinal lymph nodes, for example 15 mm AP window node. 14 mm lymph node anterior to the SVC, mildly increased. Calcified subcarinal lymph node consistent with prior granulomatous disease. Right paratracheal nodes measuring up to 16 mm also slightly increased. Small right hilar node measuring 15 mm. Esophagus within normal limits. Lungs/Pleura: Emphysema. Background of bilateral bronchial wall thickening and mild reticulation. More acute appearing superimposed bilateral ground-glass densities right greater than left. No pleural effusion or pneumothorax. Some posterior bowing of the posterior trachea and narrowed appearance of the bilateral bronchi. Upper  Abdomen: No acute finding Musculoskeletal: No acute osseous abnormality. Multiple mild chronic compression deformities of the thoracic spine Review of the MIP images confirms the above findings. IMPRESSION: 1. Negative for acute pulmonary embolus or aortic dissection. 2. Emphysema with background of bilateral bronchial wall thickening and mild reticulation. More acute appearing superimposed bilateral ground-glass densities right greater than left, suspect for multifocal pneumonia. Imaging follow-up to resolution is recommended. 3. Mild mediastinal and right  hilar adenopathy, slightly increased, and likely reactive. 4. Aortic atherosclerosis. Aortic Atherosclerosis (ICD10-I70.0) and Emphysema (ICD10-J43.9). Electronically Signed   By: Jasmine Pang M.D.   On: 07/08/2023 17:03   DG Chest Port 1 View Result Date: 07/08/2023 CLINICAL DATA:  Shortness of breath EXAM: PORTABLE CHEST 1 VIEW COMPARISON:  Chest radiograph dated 05/18/2022. FINDINGS: Similar appearance of diffuse interstitial densities which may be chronic or represent edema. There are bibasilar atelectasis. No focal consolidation. No large pleural effusion. No pneumothorax. Stable cardiac silhouette. No acute osseous pathology. IMPRESSION: 1. No focal consolidation. 2. Diffuse interstitial densities may be chronic or represent mild edema. Electronically Signed   By: Elgie Collard M.D.   On: 07/08/2023 15:21    EKG: Vent. rate 66 BPM PR interval 156 ms QRS duration 116 ms QT/QTcB 430/450 ms P-R-T axes 55 -72 24 Normal sinus rhythm Left axis deviation RSR' or QR pattern in V1 suggests right ventricular conduction delay Minimal voltage criteria for LVH, may be normal variant ( R in aVL ) Inferior infarct , age undetermined Anterolateral infarct , age undetermined Abnormal ECG When compared with ECG of 18-May-2022 10:31, PREVIOUS ECG IS PRESENT    Assessment & Plan:    Principal Problem:   COPD exacerbation (HCC) Active Problems:    CKD (chronic kidney disease)   Hypertension   Hyperlipidemia   CAD (coronary artery disease)   Acute respiratory failure with hypoxia (HCC)   Type 2 diabetes mellitus with stage 3b chronic kidney disease, without long-term current use of insulin (HCC)   Acute respiratory failure with hypoxia COPD exacerbation Multifocal pneumonia -Patient hypoxic, currently on 5 L nasal cannula  -Significant for multifocal pneumonia  -With significant wheezing as well -Continue with IV Solu-Medrol, scheduled DuoNebs and as needed albuterol-. -Was encouraged use incentive spirometry and flutter valve -continue with IV Rocephin and azithromycin  -Will order sputum culture, Legionella antigen and strep pneumoniae antigen    CAD (coronary artery disease) He denies any chest pain Troponins reassuring 23> 27 Continue with aspirin, statin and beta-blockers  Prediabetes -Will keep on insulin sliding scale as he is on steroid likely will have some hyperglycemia   Idiopathic peripheral neuropathy -Continue with Lyrica   CKd stage IIIb -Stable, creatinine at baseline    Hypertension Continue with home medications   Hyperlipidemia -continue with Zetia and statin    DVT Prophylaxis Heparin  AM Labs Ordered, also please review Full Orders  Family Communication: Admission, patients condition and plan of care including tests being ordered have been discussed with the patient and wife at bedside who indicate understanding and agree with the plan and Code Status.  Code Status full code  Likely DC to home  Consults called: None  Admission status: Inpatient  Time spent in minutes : 70 minutes   Huey Bienenstock M.D on 07/08/2023 at 8:10 PM   [facility] - Office  [phone]

## 2023-07-08 NOTE — ED Provider Notes (Addendum)
[place] EMERGENCY DEPARTMENT AT [facility] Provider Note   CSN: [id number] Arrival date & time: 07/08/23  1111     History  Chief Complaint  Patient presents with   Shortness of Breath    Kyle Patel. is a 78 y.o. male.  78 year old male history of COPD not on oxygen presenting the emergency department for hypoxia.  Sent by primary doctor who noted oxygen level 76%.  Patient states he has been in his normal state of health.  Notes cough, but chronic.  No change in sputum production.  No worsening dyspnea on exertion or lightheadedness.  Not having any chest pain.  States he otherwise feels his normal self.   Shortness of Breath      Home Medications Prior to Admission medications   Medication Sig Start Date End Date Taking? Authorizing Provider  albuterol (VENTOLIN HFA) 108 (90 Base) MCG/ACT inhaler Inhale 2 puffs into the lungs every 6 (six) hours as needed for wheezing or shortness of breath. 09/11/22  Yes Anabel Halon, MD  amLODipine (NORVASC) 10 MG tablet TAKE ONE TABLET BY MOUTH ONCE DAILY. 05/15/23  Yes Jonelle Sidle, MD  amoxicillin-clavulanate (AUGMENTIN) 875-125 MG tablet Take 1 tablet by mouth 2 (two) times daily for 2 days. 07/13/23 07/15/23 Yes Mdala-Gausi, Gwenette Greet, MD  aspirin 81 MG chewable tablet Chew 81 mg by mouth 2 (two) times daily. 05/20/22  Yes [provider]  calcitRIOL (ROCALTROL) 0.25 MCG capsule Take 0.25 mcg by mouth every Monday, Wednesday, and Friday. 07/09/20  Yes [provider]  Coenzyme Q10 100 MG TABS Take 1 tablet by mouth daily. 03/26/21  Yes [provider]  Coenzyme Q10-Vitamin E 100-150 MG-UNIT CAPS Take 1 capsule by mouth at bedtime.   Yes [provider]  ezetimibe (ZETIA) 10 MG tablet TAKE (1) TABLET BY MOUTH ONCE DAILY. 03/17/23  Yes Jonelle Sidle, MD  furosemide (LASIX) 20 MG tablet TAKE 1 TABLET BY MOUTH ONCE DAILY. 09/19/22  Yes Jonelle Sidle, MD  hydrALAZINE  (APRESOLINE) 25 MG tablet TAKE (1) TABLET BY MOUTH TWICE DAILY. 05/15/23  Yes Patel, Earlie Lou, MD  latanoprost (XALATAN) 0.005 % ophthalmic solution Place 1 drop into the left eye at bedtime. 12/19/22  Yes [provider]  metoprolol succinate (TOPROL-XL) 50 MG 24 hr tablet TAKE (1) TABLET BY MOUTH ONCE DAILY. TAKE WITH OR IMMEDIATELY FOLLOWING A MEAL. 04/14/23  Yes Jonelle Sidle, MD  Misc Natural Products (OSTEO BI-FLEX ADV JOINT SHIELD) TABS Take 1 tablet by mouth daily.   Yes [provider]  Multiple Vitamins-Minerals (CENTRUM PO) Take 1 tablet by mouth every morning.   Yes [provider]  nitroGLYCERIN (NITROSTAT) 0.4 MG SL tablet PLACE 1 TAB UNDER TONGUE EVERY 5 MIN IF NEEDED FOR CHEST PAIN. MAY USE 3 TIMES.NO RELIEF CALL 911. 07/17/22  Yes Jonelle Sidle, MD  pregabalin (LYRICA) 200 MG capsule Take 1 capsule (200 mg total) by mouth 2 (two) times daily. 02/12/23  Yes Anabel Halon, MD  rosuvastatin (CRESTOR) 10 MG tablet TAKE (1) TABLET BY MOUTH ONCE DAILY. 06/01/23  Yes Jonelle Sidle, MD  sodium bicarbonate 650 MG tablet Take 650 mg by mouth 3 (three) times daily. 06/20/20  Yes [provider]  timolol (BETIMOL) 0.5 % ophthalmic solution Place 1 drop into both eyes 2 (two) times daily.   Yes [provider]  atropine 1 % ophthalmic solution Place 1 drop into the right eye 3 (three) times daily.  Patient not taking: Reported on 07/09/2023    [provider]  JARDIANCE 10 MG TABS tablet Take 10 mg by mouth 2 (two) times a week. Monday & Friday only-start after farxiga completed 01/30/23   [provider]  tobramycin (TOBREX) 0.3 % ophthalmic solution Place 1 drop into both eyes every 6 (six) hours. Patient not taking: Reported on 07/09/2023    [provider]      Allergies    Levaquin [levofloxacin] and Statins    Review of Systems   Review of Systems  Respiratory:  Positive for shortness of breath.      Physical Exam Updated Vital Signs BP (!) 147/76 (BP Location: Left Arm)   Pulse 68   Temp 97.9 F (36.6 C) (Oral)   Resp 14   Ht 5\' 10"  (1.778 m)   Wt 94.6 kg   SpO2 96%   BMI 29.93 kg/m  Physical Exam Vitals and nursing note reviewed.  Constitutional:      General: He is not in acute distress.    Appearance: He is not toxic-appearing.  HENT:     Head: Normocephalic.  Cardiovascular:     Rate and Rhythm: Normal rate and regular rhythm.  Pulmonary:     Effort: Pulmonary effort is normal.     Breath sounds: Wheezing present.  Musculoskeletal:     Cervical back: Normal range of motion.     Right lower leg: No edema.     Left lower leg: No edema.  Skin:    General: Skin is warm.     Capillary Refill: Capillary refill takes less than 2 seconds.  Neurological:     Mental Status: He is alert and oriented to person, place, and time.  Psychiatric:        Mood and Affect: Mood normal.        Behavior: Behavior normal.     ED Results / Procedures / Treatments   Labs (all labs ordered are listed, but only abnormal results are displayed) Labs Reviewed  BRAIN NATRIURETIC PEPTIDE - Abnormal; Notable for the following components:      Result Value   B Natriuretic Peptide 258.0 (*)    All other components within normal limits  COMPREHENSIVE METABOLIC PANEL - Abnormal; Notable for the following components:   Glucose, Bld 115 (*)    BUN 38 (*)    Creatinine, Ser 2.10 (*)    GFR, Estimated 32 (*)    All other components within normal limits  CBC WITH DIFFERENTIAL/PLATELET - Abnormal; Notable for the following components:   Monocytes Absolute 1.2 (*)    All other components within normal limits  BLOOD GAS, ARTERIAL - Abnormal; Notable for the following components:   pO2, Arterial 127 (*)    All other components within normal limits  BASIC METABOLIC PANEL - Abnormal; Notable for the following components:   Glucose, Bld 217 (*)    BUN 49 (*)    Creatinine, Ser 2.35 (*)     Calcium 8.5 (*)    GFR, Estimated 28 (*)    All other components within normal limits  BLOOD GAS, ARTERIAL - Abnormal; Notable for the following components:   pH, Arterial 7.33 (*)    pCO2 arterial 50 (*)    pO2, Arterial 71 (*)    All other components within normal limits  CBC - Abnormal; Notable for the following components:   WBC 13.9 (*)    RBC 4.14 (*)    Hemoglobin 12.1 (*)  All other components within normal limits  BASIC METABOLIC PANEL - Abnormal; Notable for the following components:   Glucose, Bld 121 (*)    BUN 76 (*)    Creatinine, Ser 2.58 (*)    Calcium 8.5 (*)    GFR, Estimated 25 (*)    All other components within normal limits  CBC - Abnormal; Notable for the following components:   WBC 10.9 (*)    RBC 4.21 (*)    Hemoglobin 12.4 (*)    All other components within normal limits  BASIC METABOLIC PANEL - Abnormal; Notable for the following components:   Potassium 5.5 (*)    Glucose, Bld 166 (*)    BUN 65 (*)    Creatinine, Ser 2.27 (*)    Calcium 8.6 (*)    GFR, Estimated 29 (*)    All other components within normal limits  BASIC METABOLIC PANEL - Abnormal; Notable for the following components:   CO2 21 (*)    Glucose, Bld 230 (*)    BUN 61 (*)    Creatinine, Ser 2.15 (*)    GFR, Estimated 31 (*)    All other components within normal limits  CBC - Abnormal; Notable for the following components:   RBC 4.04 (*)    Hemoglobin 12.3 (*)    All other components within normal limits  BASIC METABOLIC PANEL - Abnormal; Notable for the following components:   Chloride 114 (*)    Glucose, Bld 224 (*)    BUN 57 (*)    Creatinine, Ser 2.03 (*)    Calcium 8.5 (*)    GFR, Estimated 34 (*)    All other components within normal limits  TROPONIN I (HIGH SENSITIVITY) - Abnormal; Notable for the following components:   Troponin I (High Sensitivity) 23 (*)    All other components within normal limits  TROPONIN I (HIGH SENSITIVITY) - Abnormal; Notable for the  following components:   Troponin I (High Sensitivity) 27 (*)    All other components within normal limits  RESP PANEL BY RT-PCR (RSV, FLU A&B, COVID)  RVPGX2  RESPIRATORY PANEL BY PCR  LEGIONELLA PNEUMOPHILA SEROGP 1 UR AG  STREP PNEUMONIAE URINARY ANTIGEN  CBC    EKG EKG Interpretation Date/Time:  Wednesday July 08 2023 11:56:02 EST Ventricular Rate:  66 PR Interval:  156 QRS Duration:  116 QT Interval:  430 QTC Calculation: 450 R Axis:   -72  Text Interpretation: Normal sinus rhythm Left axis deviation RSR' or QR pattern in V1 suggests right ventricular conduction delay Minimal voltage criteria for LVH, may be normal variant ( R in aVL ) Inferior infarct , age undetermined Anterolateral infarct , age undetermined Abnormal ECG When compared with ECG of 18-May-2022 10:31, PREVIOUS ECG IS PRESENT Confirmed by Estanislado Pandy [id number]) on 07/08/2023 2:43:04 PM  Radiology No results found.  Procedures .Critical Care  Performed by: Coral Spikes, DO Authorized by: Coral Spikes, DO   Critical care provider statement:    Critical care time (minutes):  30   Critical care was necessary to treat or prevent imminent or life-threatening deterioration of the following conditions:  Respiratory failure   Critical care was time spent personally by me on the following activities:  Development of treatment plan with patient or surrogate, discussions with consultants, evaluation of patient's response to treatment, examination of patient, ordering and review of laboratory studies, ordering and review of radiographic studies, ordering and performing treatments and interventions, pulse oximetry, re-evaluation of patient's condition  and review of old charts     Medications Ordered in ED Medications  ipratropium-albuterol (DUONEB) 0.5-2.5 (3) MG/3ML nebulizer solution 3 mL (3 mLs Nebulization Given 07/08/23 1203)  methylPREDNISolone sodium succinate (SOLU-MEDROL) 125 mg/2 mL injection 125 mg (125  mg Intravenous Given 07/08/23 1335)  iohexol (OMNIPAQUE) 350 MG/ML injection 60 mL (60 mLs Intravenous Contrast Given 07/08/23 1459)  cefTRIAXone (ROCEPHIN) 1 g in sodium chloride 0.9 % 100 mL IVPB (0 g Intravenous Stopped 07/08/23 1900)  azithromycin (ZITHROMAX) 500 mg in sodium chloride 0.9 % 250 mL IVPB (0 mg Intravenous Stopped 07/08/23 2028)  cefTRIAXone (ROCEPHIN) 1 g in sodium chloride 0.9 % 100 mL IVPB (0 g Intravenous Stopped 07/08/23 2200)  sodium zirconium cyclosilicate (LOKELMA) packet 10 g (10 g Oral Given 07/12/23 0835)    ED Course/ Medical Decision Making/ A&P Clinical Course as of 07/15/23 0726  Wed Jul 08, 2023  1303 CBC with Differential(!) No leukocytosis to suggest systemic infection.  No anemia that would explain his hypoxia [TY]  1317 Resp panel by RT-PCR (RSV, Flu A&B, Covid) Anterior Nasal Swab Negative [TY]    Clinical Course User Index [TY] Coral Spikes, DO                                 Medical Decision Making So well-appearing 78 year old male presenting emergency department for hypoxia from PCP office.  They noted oxygen levels in the upper 70s and symptoms emergency department.  Patient states he has some mild shortness of breath, but states it is essentially at his baseline.  Not having lightheadedness, chest pain.  Patient was reportedly also hypoxic here into the 60s.  Placed on nonrebreather.  On exam does not appear to be in acute respiratory distress.  Does have diffuse wheezing in all lung fields.  Not having any cardiac symptoms.  Given DuoNeb and Solu-Medrol.  Flu COVID/RSV negative.  No leukocytosis to suggest systemic infection.  Minor elevation in his troponin and BNP.  Chest x-ray with vascular congestion and possible infiltrate on right on my independent interpretation?  Given patient's hypoxia concern for possible PE.  CTA ordered.  He has been titrated down on oxygen and is now on nasal cannula.  Amount and/or Complexity of Data Reviewed Labs:  ordered. Decision-making details documented in ED Course. Radiology: ordered.  Risk Prescription drug management. Decision regarding hospitalization.          Final Clinical Impression(s) / ED Diagnoses Final diagnoses:  COPD exacerbation (HCC)  Multifocal pneumonia  Acute respiratory failure with hypoxia (HCC)    Rx / DC Orders ED Discharge Orders          Ordered    amoxicillin-clavulanate (AUGMENTIN) 875-125 MG tablet  2 times daily        07/13/23 1236    Ambulatory referral to Pulmonology       Comments: S/p admission for COPD exacerbation. Needs follow for medication management.   07/13/23 1236    Increase activity slowly        07/13/23 1236    Diet - low sodium heart healthy        07/13/23 1236    Discharge instructions       Comments: Please follow up with Pulmonology. You will be called with appointment date and time.   07/13/23 1236              Coral Spikes, DO 07/08/23 1449  Coral Spikes, DO 07/15/23 [phone]

## 2023-07-08 NOTE — Assessment & Plan Note (Signed)
Likely from acute bronchitis Other etiologies could be PE, ACS or anemia amongst others Would need ER evaluation - may need CT chest PE protocol and CBC, CMP, ABG to evaluate further Discussed with ER physician

## 2023-07-09 DIAGNOSIS — J441 Chronic obstructive pulmonary disease with (acute) exacerbation: Secondary | ICD-10-CM | POA: Diagnosis not present

## 2023-07-09 LAB — RESPIRATORY PANEL BY PCR

## 2023-07-09 LAB — BASIC METABOLIC PANEL
Anion gap: 9 (ref 5–15)
BUN: 49 mg/dL — ABNORMAL HIGH (ref 8–23)
CO2: 23 mmol/L (ref 22–32)
Calcium: 8.5 mg/dL — ABNORMAL LOW (ref 8.9–10.3)
Chloride: 104 mmol/L (ref 98–111)
Creatinine, Ser: 2.35 mg/dL — ABNORMAL HIGH (ref 0.61–1.24)
GFR, Estimated: 28 mL/min — ABNORMAL LOW (ref >60)
Glucose, Bld: 217 mg/dL — ABNORMAL HIGH (ref 70–99)
Potassium: 4.9 mmol/L (ref 3.5–5.1)
Sodium: 136 mmol/L (ref 135–145)

## 2023-07-09 LAB — CBC
HCT: 41 % (ref 39.0–52.0)
Hemoglobin: 13.1 g/dL (ref 13.0–17.0)
MCH: 30.1 pg (ref 26.0–34.0)
MCHC: 32 g/dL (ref 30.0–36.0)
MCV: 94.3 fL (ref 80.0–100.0)
Platelets: 152 10*3/uL (ref 150–400)
RBC: 4.35 MIL/uL (ref 4.22–5.81)
RDW: 15.1 % (ref 11.5–15.5)
WBC: 7.4 10*3/uL (ref 4.0–10.5)
nRBC: 0 % (ref 0.0–0.2)

## 2023-07-09 MED ORDER — TIMOLOL MALEATE 0.5 % OP SOLN
1.0000 [drp] | Freq: Two times a day (BID) | OPHTHALMIC | Status: DC
  Administered 2023-07-09 – 2023-07-13 (×8): 1 [drp] via OPHTHALMIC
  Filled 2023-07-09: qty 5

## 2023-07-09 MED ORDER — DM-GUAIFENESIN ER 30-600 MG PO TB12
1.0000 | ORAL_TABLET | Freq: Two times a day (BID) | ORAL | Status: DC
  Administered 2023-07-09 – 2023-07-13 (×9): 1 via ORAL
  Filled 2023-07-09 (×9): qty 1

## 2023-07-09 MED ORDER — METHYLPREDNISOLONE SODIUM SUCC 40 MG IJ SOLR
40.0000 mg | Freq: Two times a day (BID) | INTRAMUSCULAR | Status: DC
Start: 2023-07-09 — End: 2023-07-10
  Administered 2023-07-09 – 2023-07-10 (×2): 40 mg via INTRAVENOUS
  Filled 2023-07-09 (×2): qty 1

## 2023-07-09 NOTE — TOC CM/SW Note (Signed)
Transition of Care [facility] - Inpatient Brief Assessment   Patient Details  Name: Kyle Patel. MRN: [id number] Date of Birth: May 20, 1946  Transition of Care [facility]) CM/SW Contact:    Villa Herb, LCSWA Phone Number: 07/09/2023, 12:04 PM   Clinical Narrative: Transition of Care Department [facility]) has reviewed patient and no TOC needs have been identified at this time. We will continue to monitor patient advancement through interdiciplinary progression rounds. If new patient transition needs arise, please place a TOC consult.   Transition of Care Asessment: Insurance and Status: Insurance coverage has been reviewed Patient has primary care physician: Yes Home environment has been reviewed: From home Prior level of function:: Independent Prior/Current Home Services: No current home services Social Drivers of Health Review: SDOH reviewed no interventions necessary Readmission risk has been reviewed: Yes Transition of care needs: no transition of care needs at this time

## 2023-07-09 NOTE — Progress Notes (Signed)
[facility] PROGRESS NOTE  Kyle Patel. (DOB: 06-19-1945) IEP:[id number] PCP: Anabel Halon, MD  Brief Narrative:  Kyle Patel  is a 78 y.o. male,  with medical history significant for  HTN, CAD, CKD, COPD. -Patient presents to ED secondary to complaints of shortness of breath, reports feeling ill for last 2 days, reports cough, dyspnea over the last 2 days, reports chills, but no fever, he denies any chest pain, saturation was 76% on room air upon presentation to ED. -Patient was started on oxygen, initially nonrebreather, but weaned down to 5 L oxygen, CTA chest negative for PE but significant for multifocal pneumonia, patient started on steroids, antibiotics, nebulizer treatments and [facility] consulted to admit.  Subjective: Breathing better than at admission. No chest pain. Had PE long ago and does not feel like that did. When he sits up to eat lunch or for exam during this encounter he feels severely short of breath. Spouse at bedside  Objective: BP (!) 115/52   Pulse 83   Temp 99 F (37.2 C) (Oral)   Resp 19   Ht 5\' 10"  (1.778 m)   Wt 94.6 kg   SpO2 91%   BMI 29.93 kg/m   Gen: No distress Pulm: End-expiratory wheezes and upper airway expiratory sounds noted. Tachypneic with normal effort.   CV: RRR, no MRG or pitting edema GI: Soft, NT, ND, +BS  Neuro: Alert and oriented. No new focal deficits. Ext: Warm, no deformities. Skin: No new rashes, lesions or ulcers on visualized skin   Assessment & Plan: Acute respiratory failure with hypoxia due to AECOPD due to multifocal pneumonia: RSV, flu, covid negative. Full RVP pending. - Continue supplemental oxygen to maintain normal WOB and SpO2 >89%. Currently requiring between 4-6LPM.  - Continue ceftriaxone, azithromycin pending further evaluations (sputum Cx if able to produce and Pneumococcal and legionella Ag's pending) - Continue IV solumedrol, scheduled duonebs, prn albuterol - Continue  antitussive/mucolytic, IS, FV. He is operating at the edge of his pulmonary reserve currently, so regularly ambulating not yet possible.     CAD (coronary artery disease) He denies any chest pain Troponins reassuring 23> 27 Continue with aspirin, statin and beta-blockers   Prediabetes -Will keep on insulin sliding scale as he is on steroid likely will have some hyperglycemia   Idiopathic peripheral neuropathy -Continue with Lyrica   CKd stage IIIb -Stable, creatinine at baseline. Continue calcitriol, bicarb tabs.   Hypertension Continue with home medications   Hyperlipidemia -continue with Zetia and statin   Tyrone Nine, MD [facility] [email or web address] 07/09/2023, 2:53 PM

## 2023-07-10 LAB — BLOOD GAS, ARTERIAL
Acid-Base Excess: 0.3 mmol/L (ref 0.0–2.0)
Bicarbonate: 26.9 mmol/L (ref 20.0–28.0)
Drawn by: 27407
FIO2: 52 %
O2 Saturation: 97.1 %
Patient temperature: 36.7
pCO2 arterial: 50 mm[Hg] — ABNORMAL HIGH (ref 32–48)
pH, Arterial: 7.33 — ABNORMAL LOW (ref 7.35–7.45)
pO2, Arterial: 71 mm[Hg] — ABNORMAL LOW (ref 83–108)

## 2023-07-10 LAB — STREP PNEUMONIAE URINARY ANTIGEN: Strep Pneumo Urinary Antigen: NEGATIVE

## 2023-07-10 MED ORDER — PREDNISONE 20 MG PO TABS
40.0000 mg | ORAL_TABLET | Freq: Every day | ORAL | Status: DC
  Administered 2023-07-11 – 2023-07-13 (×3): 40 mg via ORAL
  Filled 2023-07-10 (×3): qty 2

## 2023-07-10 MED ORDER — POLYETHYLENE GLYCOL 3350 17 G PO PACK
17.0000 g | PACK | Freq: Every day | ORAL | Status: DC
  Administered 2023-07-10 – 2023-07-13 (×4): 17 g via ORAL
  Filled 2023-07-10 (×3): qty 1

## 2023-07-10 NOTE — Plan of Care (Signed)

## 2023-07-10 NOTE — Progress Notes (Addendum)
Progress Note   Patient: Kyle Patel. WUJ:[id number] DOB: [birth year] DOA: 07/08/2023     2 DOS: the patient was seen and examined on 07/10/2023   Brief hospital course: 78 year old man with PMH of HTN, CAD, CKD, COPD who presented with shortness of breath, malaise and was found to have acute hypoxic respiratory failure due to multifocal pneumonia, COPD exacerbation.  Assessment and Plan:  Acute hypoxic respiratory failure due to COPD exacerbation, multifocal pneumonia. No oxygen use at baseline. Patient required up to 8 L/min supplemental oxygen today. -Continue to treat underlying conditions. -Wean off oxygen as tolerated.  Community-acquired pneumonia RVP was negative CTA PE done on admission revealed bilateral densities.  Negative for PE.  Emphysematous changes. Patient was started on azithromycin, ceftriaxone.  -DC azithromycin as patient has received 3 doses. -Continue ceftriaxone.  COPD exacerbation Patient says his COPD is not being actively managed. He does have an inhaler at home that he was given recently. -Continue steroids: Transition to prednisone for another 5 days. -Ambulatory referral to pulmonology at discharge.  CAD Troponin was not elevated on admission. Patient denies chest pain. -Continue home aspirin, statin, beta-blockers.  Prediabetes -Continue insulin sliding scale.  Peripheral neuropathy -Continue Lyrica.  Stage III CKD Baseline creatinine appears to be 2-2.3. Patient's renal function is at baseline. -Avoid nephrotoxins.  DVT ppx: SQ heparin     Subjective: Patient states he is feeling much better.  Despite his high oxygen requirement, appears comfortable.  He states he is no longer a smoker.  Physical Exam: Vitals:   07/10/23 0451 07/10/23 0510 07/10/23 0800 07/10/23 1248  BP: 125/69   124/64  Pulse: 73   73  Resp: (!) 24   19  Temp: 98 F (36.7 C)   97.6 F (36.4 C)  TempSrc: Oral   Oral  SpO2: 95% 91% 91%   Weight:       Height:        General: Alert, oriented X3  Eyes: Pupils equal, reactive  Oral cavity: moist mucous membranes  Head: Atraumatic, normocephalic  Neck: supple  Chest: Wheezing + CVS: S1,S2 RRR. No murmurs  Abd: No distention, soft, non-tender. No masses palpable  Extr: No edema   MSK: No joint deformities or swelling  Neurological: Grossly intact.    Data Reviewed:     Latest Ref Rng & Units 07/09/2023    4:09 AM 07/08/2023   12:26 PM 04/06/2023   11:47 AM  BMP  Glucose 70 - 99 mg/dL 956  213  086   BUN 8 - 23 mg/dL 49  38  31   Creatinine 0.61 - 1.24 mg/dL 5.78  4.69  6.29   BUN/Creat Ratio 10 - 24   15   Sodium 135 - 145 mmol/L 136  136  141   Potassium 3.5 - 5.1 mmol/L 4.9  4.6  5.3   Chloride 98 - 111 mmol/L 104  103  105   CO2 22 - 32 mmol/L 23  24  19    Calcium 8.9 - 10.3 mg/dL 8.5  9.1  9.3       Latest Ref Rng & Units 07/09/2023    4:09 AM 07/08/2023   12:26 PM 05/19/2022    4:35 AM  CBC  WBC 4.0 - 10.5 K/uL 7.4  9.7  12.2   Hemoglobin 13.0 - 17.0 g/dL 52.8  41.3  24.4   Hematocrit 39.0 - 52.0 % 41.0  43.8  38.9   Platelets 150 - 400 K/uL 152  150  145      Family Communication: n/a  Disposition: Status is: Inpatient Remains inpatient appropriate because: Acute hypoxic respiratory failure requiring supplemental oxygen with no baseline oxygen use.  Planned Discharge Destination: Home    Time spent: 50 minutes  Author: Marcine Matar, MD 07/10/2023 4:52 PM  For on call review [email or web address].

## 2023-07-11 LAB — BASIC METABOLIC PANEL
Anion gap: 9 (ref 5–15)
BUN: 76 mg/dL — ABNORMAL HIGH (ref 8–23)
CO2: 23 mmol/L (ref 22–32)
Calcium: 8.5 mg/dL — ABNORMAL LOW (ref 8.9–10.3)
Chloride: 110 mmol/L (ref 98–111)
Creatinine, Ser: 2.58 mg/dL — ABNORMAL HIGH (ref 0.61–1.24)
GFR, Estimated: 25 mL/min — ABNORMAL LOW (ref >60)
Glucose, Bld: 121 mg/dL — ABNORMAL HIGH (ref 70–99)
Potassium: 4.8 mmol/L (ref 3.5–5.1)
Sodium: 142 mmol/L (ref 135–145)

## 2023-07-11 LAB — CBC
HCT: 40.4 % (ref 39.0–52.0)
Hemoglobin: 12.1 g/dL — ABNORMAL LOW (ref 13.0–17.0)
MCH: 29.2 pg (ref 26.0–34.0)
MCHC: 30 g/dL (ref 30.0–36.0)
MCV: 97.6 fL (ref 80.0–100.0)
Platelets: 166 10*3/uL (ref 150–400)
RBC: 4.14 MIL/uL — ABNORMAL LOW (ref 4.22–5.81)
RDW: 15.5 % (ref 11.5–15.5)
WBC: 13.9 10*3/uL — ABNORMAL HIGH (ref 4.0–10.5)
nRBC: 0 % (ref 0.0–0.2)

## 2023-07-11 MED ORDER — IPRATROPIUM-ALBUTEROL 0.5-2.5 (3) MG/3ML IN SOLN
3.0000 mL | RESPIRATORY_TRACT | Status: DC
  Administered 2023-07-11 – 2023-07-12 (×2): 3 mL via RESPIRATORY_TRACT
  Filled 2023-07-11 (×2): qty 3

## 2023-07-11 NOTE — Progress Notes (Signed)
Progress Note   Patient: Kyle Patel. ZOX:[id number] DOB: September 14, 1945 DOA: 07/08/2023     3 DOS: the patient was seen and examined on 07/11/2023   Brief hospital course: 78 year old man with PMH of HTN, CAD, CKD, COPD who presented with shortness of breath, malaise and was found to have acute hypoxic respiratory failure due to multifocal pneumonia, COPD exacerbation.  Assessment and Plan:  Acute hypoxic respiratory failure due to COPD exacerbation, multifocal pneumonia. No oxygen use at baseline. Patient required up to 8 L/min supplemental oxygen. On 6L/min today -Continue to treat underlying conditions. -Wean off oxygen as tolerated.  Community-acquired pneumonia RVP was negative CTA PE done on admission revealed bilateral densities - multifocal pneumonia.  Negative for PE.  Emphysematous changes. Patient was started on azithromycin, ceftriaxone.  Received 3 doses of azithromycin.  -Continue ceftriaxone.  COPD exacerbation Patient says his COPD is not being actively managed. He does have an inhaler at home that he was given recently. -Continue steroids: prednisone x 5 days -Ambulatory referral to pulmonology at discharge.  CAD Troponin was not elevated on admission. Patient denies chest pain. -Continue home aspirin, statin, beta-blockers.  Prediabetes -Continue insulin sliding scale.  Peripheral neuropathy -Continue Lyrica.  Stage III CKD Baseline creatinine appears to be 2-2.3. Patient's renal function is at baseline. -Avoid nephrotoxins.       Subjective: Patient has no new complaints. On 6L/min.   Physical Exam: Vitals:   07/10/23 2200 07/10/23 2319 07/11/23 0858 07/11/23 1248  BP: 127/70     Pulse: 75     Resp: 20     Temp: 97.9 F (36.6 C)     TempSrc: Oral     SpO2: 93% 90% 97% (!) 73%  Weight:      Height:        General: Alert, oriented X3  Eyes: Pupils equal, reactive  Oral cavity: moist mucous membranes  Head: Atraumatic,  normocephalic  Neck: supple  Chest: clear to auscultation CVS: S1,S2 RRR. No murmurs  Abd: No distention, soft, non-tender. No masses palpable  Extr: No edema   MSK: No joint deformities or swelling  Neurological: Grossly intact.    Data Reviewed:     Latest Ref Rng & Units 07/11/2023    5:22 AM 07/09/2023    4:09 AM 07/08/2023   12:26 PM  BMP  Glucose 70 - 99 mg/dL 811  914  782   BUN 8 - 23 mg/dL 76  49  38   Creatinine 0.61 - 1.24 mg/dL 9.56  2.13  0.86   Sodium 135 - 145 mmol/L 142  136  136   Potassium 3.5 - 5.1 mmol/L 4.8  4.9  4.6   Chloride 98 - 111 mmol/L 110  104  103   CO2 22 - 32 mmol/L 23  23  24    Calcium 8.9 - 10.3 mg/dL 8.5  8.5  9.1       Latest Ref Rng & Units 07/11/2023    5:22 AM 07/09/2023    4:09 AM 07/08/2023   12:26 PM  CBC  WBC 4.0 - 10.5 K/uL 13.9  7.4  9.7   Hemoglobin 13.0 - 17.0 g/dL 57.8  46.9  62.9   Hematocrit 39.0 - 52.0 % 40.4  41.0  43.8   Platelets 150 - 400 K/uL 166  152  150      Family Communication: n/a  Disposition: Status is: Inpatient Remains inpatient appropriate because: Acute hypoxic respiratory failure requiring supplemental oxygen with no baseline  oxygen use.  Planned Discharge Destination: Home DVT ppx: SQ heparin     Time spent: 40 minutes  Author: MDALA-GAUSI, Gwenette Greet, MD 07/11/2023 1:02 PM  For on call review [email or web address].

## 2023-07-11 NOTE — Plan of Care (Signed)

## 2023-07-12 LAB — CBC
HCT: 41.3 % (ref 39.0–52.0)
Hemoglobin: 12.4 g/dL — ABNORMAL LOW (ref 13.0–17.0)
MCH: 29.5 pg (ref 26.0–34.0)
MCHC: 30 g/dL (ref 30.0–36.0)
MCV: 98.1 fL (ref 80.0–100.0)
Platelets: 161 10*3/uL (ref 150–400)
RBC: 4.21 MIL/uL — ABNORMAL LOW (ref 4.22–5.81)
RDW: 15.2 % (ref 11.5–15.5)
WBC: 10.9 10*3/uL — ABNORMAL HIGH (ref 4.0–10.5)
nRBC: 0 % (ref 0.0–0.2)

## 2023-07-12 LAB — BASIC METABOLIC PANEL
Anion gap: 10 (ref 5–15)
Anion gap: 13 (ref 5–15)
BUN: 65 mg/dL — ABNORMAL HIGH (ref 8–23)
BUN: 61 mg/dL — ABNORMAL HIGH (ref 8–23)
CO2: 24 mmol/L (ref 22–32)
CO2: 21 mmol/L — ABNORMAL LOW (ref 22–32)
Calcium: 8.6 mg/dL — ABNORMAL LOW (ref 8.9–10.3)
Calcium: 8.9 mg/dL (ref 8.9–10.3)
Chloride: 110 mmol/L (ref 98–111)
Chloride: 110 mmol/L (ref 98–111)
Creatinine, Ser: 2.27 mg/dL — ABNORMAL HIGH (ref 0.61–1.24)
Creatinine, Ser: 2.15 mg/dL — ABNORMAL HIGH (ref 0.61–1.24)
GFR, Estimated: 29 mL/min — ABNORMAL LOW (ref >60)
GFR, Estimated: 31 mL/min — ABNORMAL LOW (ref >60)
Glucose, Bld: 166 mg/dL — ABNORMAL HIGH (ref 70–99)
Glucose, Bld: 230 mg/dL — ABNORMAL HIGH (ref 70–99)
Potassium: 5.5 mmol/L — ABNORMAL HIGH (ref 3.5–5.1)
Potassium: 4.9 mmol/L (ref 3.5–5.1)
Sodium: 144 mmol/L (ref 135–145)
Sodium: 144 mmol/L (ref 135–145)

## 2023-07-12 LAB — LEGIONELLA PNEUMOPHILA SEROGP 1 UR AG: L. pneumophila Serogp 1 Ur Ag: NEGATIVE

## 2023-07-12 MED ORDER — SODIUM ZIRCONIUM CYCLOSILICATE 10 G PO PACK
10.0000 g | PACK | Freq: Once | ORAL | Status: AC
  Administered 2023-07-12: 10 g via ORAL
  Filled 2023-07-12: qty 1

## 2023-07-12 MED ORDER — IPRATROPIUM-ALBUTEROL 0.5-2.5 (3) MG/3ML IN SOLN
3.0000 mL | Freq: Three times a day (TID) | RESPIRATORY_TRACT | Status: DC
  Administered 2023-07-12 – 2023-07-13 (×4): 3 mL via RESPIRATORY_TRACT
  Filled 2023-07-12 (×4): qty 3

## 2023-07-12 MED ORDER — FUROSEMIDE 20 MG PO TABS
20.0000 mg | ORAL_TABLET | Freq: Every day | ORAL | Status: DC
  Administered 2023-07-12 – 2023-07-13 (×2): 20 mg via ORAL
  Filled 2023-07-12 (×2): qty 1

## 2023-07-12 NOTE — Progress Notes (Addendum)
Progress Note   Patient: Kyle Patel. WUJ:[id number] DOB: 11-12-1945 DOA: 07/08/2023     4 DOS: the patient was seen and examined on 07/12/2023   Brief hospital course: 78 year old man with PMH of HTN, CAD, CKD, COPD who presented with shortness of breath, malaise and was found to have acute hypoxic respiratory failure due to multifocal pneumonia, COPD exacerbation.  Assessment and Plan:  Hyperkalemia Likely due to CKD. - Lokelma x 1 - Resume home lasix.  - Repeat BMP in PM.   Acute hypoxic respiratory failure due to COPD exacerbation, multifocal pneumonia. No oxygen use at baseline. Patient required up to 8 L/min supplemental oxygen. On 5L/min today -Continue to treat underlying conditions. -Wean off oxygen as tolerated. -Incentive spirometry, flutter valve.  Community-acquired pneumonia RVP was negative CTA PE done on admission revealed bilateral densities - multifocal pneumonia.  Negative for PE.  Emphysematous changes. Patient was started on azithromycin, ceftriaxone.  Received 3 doses of azithromycin.  -Continue ceftriaxone, and plan to complete a 7-day course of antibiotics.   COPD exacerbation Patient says his COPD is not being actively managed. He does have an inhaler at home that he was given recently. -Continue steroids: prednisone x 5 days -Ambulatory referral to pulmonology at discharge.  CAD Troponin was not elevated on admission. Patient denies chest pain. -Continue home aspirin, statin, beta-blockers.  Prediabetes -Continue insulin sliding scale.  Peripheral neuropathy -Continue Lyrica.  Stage III CKD Baseline creatinine appears to be 2-2.3. Patient's renal function is at baseline. -Avoid nephrotoxins.       Subjective: Patient has no new complaints. On 5L/min. Hyperkalemic this morning. Lokelma given.   Physical Exam: Vitals:   07/11/23 1919 07/11/23 2107 07/11/23 2312 07/12/23 0911  BP:  131/67    Pulse:  77    Resp:  20    Temp:   98.1 F (36.7 C)    TempSrc:  Oral    SpO2: 92% 94% 95% 91%  Weight:      Height:        General: Alert, oriented X3  Eyes: Pupils equal, reactive  Oral cavity: moist mucous membranes  Head: Atraumatic, normocephalic  Neck: supple  Chest: clear to auscultation CVS: S1,S2 RRR. No murmurs  Abd: No distention, soft, non-tender. No masses palpable  Extr: No edema   MSK: No joint deformities or swelling  Neurological: Grossly intact.    Data Reviewed:     Latest Ref Rng & Units 07/12/2023    4:51 AM 07/11/2023    5:22 AM 07/09/2023    4:09 AM  BMP  Glucose 70 - 99 mg/dL 956  213  086   BUN 8 - 23 mg/dL 65  76  49   Creatinine 0.61 - 1.24 mg/dL 5.78  4.69  6.29   Sodium 135 - 145 mmol/L 144  142  136   Potassium 3.5 - 5.1 mmol/L 5.5  4.8  4.9   Chloride 98 - 111 mmol/L 110  110  104   CO2 22 - 32 mmol/L 24  23  23    Calcium 8.9 - 10.3 mg/dL 8.6  8.5  8.5       Latest Ref Rng & Units 07/12/2023    4:51 AM 07/11/2023    5:22 AM 07/09/2023    4:09 AM  CBC  WBC 4.0 - 10.5 K/uL 10.9  13.9  7.4   Hemoglobin 13.0 - 17.0 g/dL 52.8  41.3  24.4   Hematocrit 39.0 - 52.0 % 41.3  40.4  41.0   Platelets 150 - 400 K/uL 161  166  152      Family Communication: n/a  Disposition: Status is: Inpatient Remains inpatient appropriate because: Acute hypoxic respiratory failure requiring supplemental oxygen with no baseline oxygen use.  Planned Discharge Destination: Home DVT ppx: SQ heparin  Time spent: 50 minutes  Author: MDALA-GAUSI, Gwenette Greet, MD 07/12/2023 11:21 AM  For on call review [email or web address].

## 2023-07-12 NOTE — Progress Notes (Signed)
1610 Patient eating breakfast, unavailable for nebulizer at this time.

## 2023-07-13 LAB — BASIC METABOLIC PANEL
Anion gap: 6 (ref 5–15)
BUN: 57 mg/dL — ABNORMAL HIGH (ref 8–23)
CO2: 24 mmol/L (ref 22–32)
Calcium: 8.5 mg/dL — ABNORMAL LOW (ref 8.9–10.3)
Chloride: 114 mmol/L — ABNORMAL HIGH (ref 98–111)
Creatinine, Ser: 2.03 mg/dL — ABNORMAL HIGH (ref 0.61–1.24)
GFR, Estimated: 34 mL/min — ABNORMAL LOW (ref >60)
Glucose, Bld: 224 mg/dL — ABNORMAL HIGH (ref 70–99)
Potassium: 4.5 mmol/L (ref 3.5–5.1)
Sodium: 144 mmol/L (ref 135–145)

## 2023-07-13 LAB — CBC
HCT: 39 % (ref 39.0–52.0)
Hemoglobin: 12.3 g/dL — ABNORMAL LOW (ref 13.0–17.0)
MCH: 30.4 pg (ref 26.0–34.0)
MCHC: 31.5 g/dL (ref 30.0–36.0)
MCV: 96.5 fL (ref 80.0–100.0)
Platelets: 159 10*3/uL (ref 150–400)
RBC: 4.04 MIL/uL — ABNORMAL LOW (ref 4.22–5.81)
RDW: 15.4 % (ref 11.5–15.5)
WBC: 10 10*3/uL (ref 4.0–10.5)
nRBC: 0.2 % (ref 0.0–0.2)

## 2023-07-13 MED ORDER — AMOXICILLIN-POT CLAVULANATE 875-125 MG PO TABS: 1.0000 | ORAL_TABLET | Freq: Two times a day (BID) | ORAL | 0 refills | Status: AC

## 2023-07-13 NOTE — Progress Notes (Signed)
Mobility Specialist Progress Note:   07/13/23 0925  Mobility  Activity Ambulated with assistance in hallway  Level of Assistance Standby assist, set-up cues, supervision of patient - no hands on  Assistive Device None  Distance Ambulated (ft) 80 ft  Range of Motion/Exercises Active;All extremities  Activity Response Tolerated well  Mobility Referral Yes  Mobility visit 1 Mobility  Mobility Specialist Start Time (ACUTE ONLY) [id number]  Mobility Specialist Stop Time (ACUTE ONLY) 0950  Mobility Specialist Time Calculation (min) (ACUTE ONLY) 25 min   Pt received in bed, agreeable to mobility. Required SBA to stand and ambulate with no AD. Tolerated well, O2 sats below. Returned pt to bed, all needs met.  SpO2 90% on RA at rest SpO2 84% on RA during ambulation SpO2 92% on 6L during ambulation  Rozell Kettlewell Mobility Specialist Please contact via SecureChat or  Rehab office at [phone]

## 2023-07-13 NOTE — Plan of Care (Signed)
  Problem: Education: Goal: Knowledge of General Education information will improve Description: Including pain rating scale, medication(s)/side effects and non-pharmacologic comfort measures Outcome: Progressing   Problem: Health Behavior/Discharge Planning: Goal: Ability to manage health-related needs will improve Outcome: Progressing   Problem: Clinical Measurements: Goal: Ability to maintain clinical measurements within normal limits will improve Outcome: Progressing Goal: Will remain free from infection Outcome: Progressing Goal: Diagnostic test results will improve Outcome: Progressing Goal: Respiratory complications will improve Outcome: Progressing Goal: Cardiovascular complication will be avoided Outcome: Progressing   Problem: Activity: Goal: Risk for activity intolerance will decrease Outcome: Progressing   Problem: Nutrition: Goal: Adequate nutrition will be maintained Outcome: Progressing   Problem: Elimination: Goal: Will not experience complications related to bowel motility Outcome: Progressing   Problem: Pain Managment: Goal: General experience of comfort will improve and/or be controlled Outcome: Progressing   Problem: Safety: Goal: Ability to remain free from injury will improve Outcome: Progressing   Problem: Skin Integrity: Goal: Risk for impaired skin integrity will decrease Outcome: Progressing   Problem: Activity: Goal: Ability to tolerate increased activity will improve Outcome: Progressing   Problem: Activity: Goal: Ability to tolerate increased activity will improve Outcome: Progressing

## 2023-07-13 NOTE — Progress Notes (Addendum)
SATURATION QUALIFICATIONS: (This note is used to comply with regulatory documentation for home oxygen)  Patient Saturations on Room Air at Rest = 90%  Patient Saturations on Room Air while Ambulating = 84%  Patient Saturations on 6 Liters of oxygen while Ambulating = 92%  Please briefly explain why patient needs home oxygen: The patient is at 93% resting on 4 liters. While ambulating his saturation drops significantly and the patient struggles to get his oxygen level back up without oxygen therapy.

## 2023-07-13 NOTE — Discharge Summary (Signed)
Physician Discharge Summary   Patient: Kyle Patel. MRN: [id number] DOB: 1945-08-23  Admit date:     07/08/2023  Discharge date: 07/13/23  Discharge Physician: MDALA-GAUSI, Gwenette Greet   PCP: Anabel Halon, MD   Recommendations at discharge:    Follow up with Pulmonology  Discharge Diagnoses: Principal Problem:   COPD exacerbation (HCC) Active Problems:   CKD (chronic kidney disease)   Hypertension   Hyperlipidemia   CAD (coronary artery disease)   Acute respiratory failure with hypoxia (HCC)   Type 2 diabetes mellitus with stage 3b chronic kidney disease, without long-term current use of insulin (HCC)  Resolved Problems:   * No resolved hospital problems. *  Hospital Course: 78 year old man with PMH of HTN, CAD, CKD, COPD who presented with shortness of breath, malaise and was found to have acute hypoxic respiratory failure due to multifocal pneumonia, COPD exacerbation.   The hospital course is in problem-based format below:  Acute hypoxic respiratory failure due to COPD exacerbation, multifocal pneumonia. No oxygen use at baseline. Patient required up to 8 L/min supplemental oxygen. He was weaned down to 4L/min at rest and 6L/min with ambulation.  Home oxygen was arranged.    Community-acquired pneumonia RVP was negative CTA PE done on admission revealed bilateral densities - multifocal pneumonia.  Negative for PE.  Emphysematous changes noted. Patient was treated with azithromycin, ceftriaxone while in hospital and discharged on Augmentin.  COPD exacerbation Patient says his COPD is not being actively managed. He does have an inhaler at home that he was given recently. He was treated with DuoNebs, steroids, antibiotics as above. An ambulatory referral to pulmonology was placed.   CAD Troponin was not elevated on admission. Patient denied chest pain. Continued home aspirin, statin, beta-blockers.   Prediabetes Home Jardiance was resumed at  discharge.  Peripheral neuropathy Continued home Lyrica.   Stage III CKD Baseline creatinine appears to be 2-2.3. Patient's renal function remained at baseline.      Consultants: n/a Procedures performed: n/a  Disposition: Home Diet recommendation:  Discharge Diet Orders (From admission, onward)     Start     Ordered   07/13/23 0000  Diet - low sodium heart healthy        07/13/23 1236           Carb modified diet DISCHARGE MEDICATION: Allergies as of 07/13/2023       Reactions   Levaquin [levofloxacin] Itching   Statins Other (See Comments)   Myalgias        Medication List     TAKE these medications    albuterol 108 (90 Base) MCG/ACT inhaler Commonly known as: VENTOLIN HFA Inhale 2 puffs into the lungs every 6 (six) hours as needed for wheezing or shortness of breath.   amLODipine 10 MG tablet Commonly known as: NORVASC TAKE ONE TABLET BY MOUTH ONCE DAILY.   amoxicillin-clavulanate 875-125 MG tablet Commonly known as: AUGMENTIN Take 1 tablet by mouth 2 (two) times daily for 2 days.   aspirin 81 MG chewable tablet Chew 81 mg by mouth 2 (two) times daily.   atropine 1 % ophthalmic solution Place 1 drop into the right eye 3 (three) times daily.   calcitRIOL 0.25 MCG capsule Commonly known as: ROCALTROL Take 0.25 mcg by mouth every Monday, Wednesday, and Friday.   CENTRUM PO Take 1 tablet by mouth every morning.   Coenzyme Q10 100 MG Tabs Take 1 tablet by mouth daily.   Coenzyme Q10-Vitamin E 100-150 MG-UNIT Caps  Take 1 capsule by mouth at bedtime.   ezetimibe 10 MG tablet Commonly known as: ZETIA TAKE (1) TABLET BY MOUTH ONCE DAILY.   furosemide 20 MG tablet Commonly known as: LASIX TAKE 1 TABLET BY MOUTH ONCE DAILY.   hydrALAZINE 25 MG tablet Commonly known as: APRESOLINE TAKE (1) TABLET BY MOUTH TWICE DAILY.   Jardiance 10 MG Tabs tablet Generic drug: empagliflozin Take 10 mg by mouth 2 (two) times a week. Monday & Friday  only-start after farxiga completed   latanoprost 0.005 % ophthalmic solution Commonly known as: XALATAN Place 1 drop into the left eye at bedtime.   metoprolol succinate 50 MG 24 hr tablet Commonly known as: TOPROL-XL TAKE (1) TABLET BY MOUTH ONCE DAILY. TAKE WITH OR IMMEDIATELY FOLLOWING A MEAL.   nitroGLYCERIN 0.4 MG SL tablet Commonly known as: NITROSTAT PLACE 1 TAB UNDER TONGUE EVERY 5 MIN IF NEEDED FOR CHEST PAIN. MAY USE 3 TIMES.NO RELIEF CALL 911.   Osteo Bi-Flex Adv Joint Shield Tabs Take 1 tablet by mouth daily.   pregabalin 200 MG capsule Commonly known as: LYRICA Take 1 capsule (200 mg total) by mouth 2 (two) times daily.   rosuvastatin 10 MG tablet Commonly known as: CRESTOR TAKE (1) TABLET BY MOUTH ONCE DAILY.   sodium bicarbonate 650 MG tablet Take 650 mg by mouth 3 (three) times daily.   timolol 0.5 % ophthalmic solution Commonly known as: BETIMOL Place 1 drop into both eyes 2 (two) times daily.   tobramycin 0.3 % ophthalmic solution Commonly known as: TOBREX Place 1 drop into both eyes every 6 (six) hours.               Durable Medical Equipment  (From admission, onward)           Start     Ordered   07/13/23 1141  For home use only DME oxygen  Once       Question Answer Comment  Length of Need 12 Months   Mode or (Route) Nasal cannula   Liters per Minute 6   Frequency Continuous (stationary and portable oxygen unit needed)   Oxygen conserving device Yes   Oxygen delivery system Gas      07/13/23 1140            Discharge Exam: Filed Weights   07/08/23 1153 07/08/23 2015  Weight: 98.4 kg 94.6 kg   Physical Exam on Day of Discharge   General: Alert, cheerful, oriented X3  Oral cavity: moist mucous membranes  Neck: supple  Chest: clear to auscultation. No crackles, no wheezes  CVS: S1,S2 RRR. No murmurs  Abd: No distention, soft, non-tender. No masses palpable  Extr: No edema    Condition at discharge: stable  The  results of significant diagnostics from this hospitalization (including imaging, microbiology, ancillary and laboratory) are listed below for reference.   Imaging Studies: CT Angio Chest PE W and/or Wo Contrast Result Date: 07/08/2023 CLINICAL DATA:  Hypoxia cough EXAM: CT ANGIOGRAPHY CHEST WITH CONTRAST TECHNIQUE: Multidetector CT imaging of the chest was performed using the standard protocol during bolus administration of intravenous contrast. Multiplanar CT image reconstructions and MIPs were obtained to evaluate the vascular anatomy. RADIATION DOSE REDUCTION: This exam was performed according to the departmental dose-optimization program which includes automated exposure control, adjustment of the mA and/or kV according to patient size and/or use of iterative reconstruction technique. CONTRAST:  60mL OMNIPAQUE IOHEXOL 350 MG/ML SOLN COMPARISON:  Chest x-ray 07/08/2023, CT chest 05/18/2022 FINDINGS: Cardiovascular: Satisfactory  opacification of the pulmonary arteries to the segmental level. No evidence of pulmonary embolism. Nonaneurysmal aorta. No dissection. Mild atherosclerosis. Mild coronary vascular calcification. Mediastinum/Nodes: Patent trachea. No thyroid mass. Mild mediastinal lymph nodes, for example 15 mm AP window node. 14 mm lymph node anterior to the SVC, mildly increased. Calcified subcarinal lymph node consistent with prior granulomatous disease. Right paratracheal nodes measuring up to 16 mm also slightly increased. Small right hilar node measuring 15 mm. Esophagus within normal limits. Lungs/Pleura: Emphysema. Background of bilateral bronchial wall thickening and mild reticulation. More acute appearing superimposed bilateral ground-glass densities right greater than left. No pleural effusion or pneumothorax. Some posterior bowing of the posterior trachea and narrowed appearance of the bilateral bronchi. Upper Abdomen: No acute finding Musculoskeletal: No acute osseous abnormality. Multiple  mild chronic compression deformities of the thoracic spine Review of the MIP images confirms the above findings. IMPRESSION: 1. Negative for acute pulmonary embolus or aortic dissection. 2. Emphysema with background of bilateral bronchial wall thickening and mild reticulation. More acute appearing superimposed bilateral ground-glass densities right greater than left, suspect for multifocal pneumonia. Imaging follow-up to resolution is recommended. 3. Mild mediastinal and right hilar adenopathy, slightly increased, and likely reactive. 4. Aortic atherosclerosis. Aortic Atherosclerosis (ICD10-I70.0) and Emphysema (ICD10-J43.9). Electronically Signed   By: Jasmine Pang M.D.   On: 07/08/2023 17:03   DG Chest Port 1 View Result Date: 07/08/2023 CLINICAL DATA:  Shortness of breath EXAM: PORTABLE CHEST 1 VIEW COMPARISON:  Chest radiograph dated 05/18/2022. FINDINGS: Similar appearance of diffuse interstitial densities which may be chronic or represent edema. There are bibasilar atelectasis. No focal consolidation. No large pleural effusion. No pneumothorax. Stable cardiac silhouette. No acute osseous pathology. IMPRESSION: 1. No focal consolidation. 2. Diffuse interstitial densities may be chronic or represent mild edema. Electronically Signed   By: Elgie Collard M.D.   On: 07/08/2023 15:21    Microbiology: Results for orders placed or performed during the hospital encounter of 07/08/23  Resp panel by RT-PCR (RSV, Flu A&B, Covid) Anterior Nasal Swab     Status: None   Collection Time: 07/08/23 11:55 AM   Specimen: Anterior Nasal Swab  Result Value Ref Range Status   SARS Coronavirus 2 by RT PCR NEGATIVE NEGATIVE Final    Comment: (NOTE) SARS-CoV-2 target nucleic acids are NOT DETECTED.  The SARS-CoV-2 RNA is generally detectable in upper respiratory specimens during the acute phase of infection. The lowest concentration of SARS-CoV-2 viral copies this assay can detect is 138 copies/mL. A negative  result does not preclude SARS-Cov-2 infection and should not be used as the sole basis for treatment or other patient management decisions. A negative result may occur with  improper specimen collection/handling, submission of specimen other than nasopharyngeal swab, presence of viral mutation(s) within the areas targeted by this assay, and inadequate number of viral copies(<138 copies/mL). A negative result must be combined with clinical observations, patient history, and epidemiological information. The expected result is Negative.  Fact Sheet for Patients:  BloggerCourse.com  Fact Sheet for Healthcare Providers:  SeriousBroker.it  This test is no t yet approved or cleared by the [place] FDA and  has been authorized for detection and/or diagnosis of SARS-CoV-2 by FDA under an Emergency Use Authorization (EUA). This EUA will remain  in effect (meaning this test can be used) for the duration of the COVID-19 declaration under Section 564(b)(1) of the Act, 21 U.S.C.section 360bbb-3(b)(1), unless the authorization is terminated  or revoked sooner.  Influenza A by PCR NEGATIVE NEGATIVE Final   Influenza B by PCR NEGATIVE NEGATIVE Final    Comment: (NOTE) The Xpert Xpress SARS-CoV-2/FLU/RSV plus assay is intended as an aid in the diagnosis of influenza from Nasopharyngeal swab specimens and should not be used as a sole basis for treatment. Nasal washings and aspirates are unacceptable for Xpert Xpress SARS-CoV-2/FLU/RSV testing.  Fact Sheet for Patients: BloggerCourse.com  Fact Sheet for Healthcare Providers: SeriousBroker.it  This test is not yet approved or cleared by the [place] FDA and has been authorized for detection and/or diagnosis of SARS-CoV-2 by FDA under an Emergency Use Authorization (EUA). This EUA will remain in effect (meaning this test can be used)  for the duration of the COVID-19 declaration under Section 564(b)(1) of the Act, 21 U.S.C. section 360bbb-3(b)(1), unless the authorization is terminated or revoked.     Resp Syncytial Virus by PCR NEGATIVE NEGATIVE Final    Comment: (NOTE) Fact Sheet for Patients: BloggerCourse.com  Fact Sheet for Healthcare Providers: SeriousBroker.it  This test is not yet approved or cleared by the [place] FDA and has been authorized for detection and/or diagnosis of SARS-CoV-2 by FDA under an Emergency Use Authorization (EUA). This EUA will remain in effect (meaning this test can be used) for the duration of the COVID-19 declaration under Section 564(b)(1) of the Act, 21 U.S.C. section 360bbb-3(b)(1), unless the authorization is terminated or revoked.  Performed at [facility], [address], [place], [place] [address]   Respiratory (~20 pathogens) panel by PCR     Status: None   Collection Time: 07/09/23  2:21 PM   Specimen: Nasopharyngeal Swab; Respiratory  Result Value Ref Range Status   Adenovirus NOT DETECTED NOT DETECTED Final   Coronavirus 229E NOT DETECTED NOT DETECTED Final    Comment: (NOTE) The Coronavirus on the Respiratory Panel, DOES NOT test for the novel  Coronavirus (2019 nCoV)    Coronavirus HKU1 NOT DETECTED NOT DETECTED Final   Coronavirus NL63 NOT DETECTED NOT DETECTED Final   Coronavirus OC43 NOT DETECTED NOT DETECTED Final   Metapneumovirus NOT DETECTED NOT DETECTED Final   Rhinovirus / Enterovirus NOT DETECTED NOT DETECTED Final   Influenza A NOT DETECTED NOT DETECTED Final   Influenza B NOT DETECTED NOT DETECTED Final   Parainfluenza Virus 1 NOT DETECTED NOT DETECTED Final   Parainfluenza Virus 2 NOT DETECTED NOT DETECTED Final   Parainfluenza Virus 3 NOT DETECTED NOT DETECTED Final   Parainfluenza Virus 4 NOT DETECTED NOT DETECTED Final   Respiratory Syncytial Virus NOT DETECTED NOT DETECTED Final    Bordetella pertussis NOT DETECTED NOT DETECTED Final   Bordetella Parapertussis NOT DETECTED NOT DETECTED Final   Chlamydophila pneumoniae NOT DETECTED NOT DETECTED Final   Mycoplasma pneumoniae NOT DETECTED NOT DETECTED Final    Comment: Performed at [facility], [address] [place], [place] [address]    Labs: CBC: Recent Labs  Lab 07/08/23 1226 07/09/23 0409 07/11/23 0522 07/12/23 0451 07/13/23 0453  WBC 9.7 7.4 13.9* 10.9* 10.0  NEUTROABS 6.9  --   --   --   --   HGB 14.0 13.1 12.1* 12.4* 12.3*  HCT 43.8 41.0 40.4 41.3 39.0  MCV 95.0 94.3 97.6 98.1 96.5  PLT 150 152 166 161 159   Basic Metabolic Panel: Recent Labs  Lab 07/09/23 0409 07/11/23 0522 07/12/23 0451 07/12/23 1316 07/13/23 0453  NA 136 142 144 144 144  K 4.9 4.8 5.5* 4.9 4.5  CL 104 110  110 110 114*  CO2 23 23 24  21* 24  GLUCOSE 217* 121* 166* 230* 224*  BUN 49* 76* 65* 61* 57*  CREATININE 2.35* 2.58* 2.27* 2.15* 2.03*  CALCIUM 8.5* 8.5* 8.6* 8.9 8.5*   Liver Function Tests: Recent Labs  Lab 07/08/23 1226  AST 29  ALT 21  ALKPHOS 55  BILITOT 0.9  PROT 8.0  ALBUMIN 3.6   CBG: No results for input(s): "GLUCAP" in the last 168 hours.  Discharge time spent: greater than 30 minutes.  Signed: MDALA-GAUSI, Gwenette Greet, MD [facility] 07/13/2023

## 2023-07-13 NOTE — TOC Initial Note (Signed)
Transition of Care (TOC) - Initial/Assessment Note    Patient Details  Name: Kyle Patel. MRN: [id number] Date of Birth: 03-23-1946  Transition of Care [facility]) CM/SW Contact:    Elliot Gault, LCSW Phone Number: 07/13/2023, 11:49 AM  Clinical Narrative:                  Pt stable for dc today per MD. Pt now with a high readmission risk score. Pt in need of Home O2 for dc.  Met with pt at bedside to assess and assist with dc planning. Pt reports he will return home with family at dc. He states he is independent in ADLS and able to get to appointments and obtain medications as needed.  Pt agreeable to O2 at dc. CMS provider options reviewed and referred as requested. Portable o2 tank from [employer] at bedside for pt's use going home. [employer] will deliver home setup once pt home.  Pt states family will transport after 2:30. No other TOC needs for dc.   Expected Discharge Plan: Home/Self Care Barriers to Discharge: Barriers Resolved   Patient Goals and CMS Choice Patient states their goals for this hospitalization and ongoing recovery are:: go home CMS Medicare.gov Compare Post Acute Care list provided to:: Patient Choice offered to / list presented to : Patient      Expected Discharge Plan and Services In-house Referral: Clinical Social Work   Post Acute Care Choice: Durable Medical Equipment Living arrangements for the past 2 months: Single Family Home                 DME Arranged: Oxygen DME Agency: Patsy Lager Date DME Agency Contacted: 07/13/23   Representative spoke with at DME Agency: Morrie Sheldon            Prior Living Arrangements/Services Living arrangements for the past 2 months: Single Family Home Lives with:: Spouse Patient language and need for interpreter reviewed:: Yes Do you feel safe going back to the place where you live?: Yes      Need for Family Participation in Patient Care: No (Comment) Care giver support system in place?: Yes (comment)   Criminal  Activity/Legal Involvement Pertinent to Current Situation/Hospitalization: No - Comment as needed  Activities of Daily Living   ADL Screening (condition at time of admission) Independently performs ADLs?: Yes (appropriate for developmental age) Is the patient deaf or have difficulty hearing?: No Does the patient have difficulty seeing, even when wearing glasses/contacts?: No Does the patient have difficulty concentrating, remembering, or making decisions?: No  Permission Sought/Granted Permission sought to share information with : Facility Industrial/product designer granted to share information with : Yes, Verbal Permission Granted     Permission granted to share info w AGENCY: DME        Emotional Assessment Appearance:: Appears stated age Attitude/Demeanor/Rapport: Engaged Affect (typically observed): Pleasant Orientation: : Oriented to Self, Oriented to Place, Oriented to  Time, Oriented to Situation Alcohol / Substance Use: Not Applicable Psych Involvement: No (comment)  Admission diagnosis:  COPD exacerbation (HCC) [J44.1] Acute respiratory failure with hypoxia (HCC) [J96.01] Multifocal pneumonia [J18.9] Patient Active Problem List   Diagnosis Date Noted   COPD exacerbation (HCC) 07/08/2023   Onychomycosis 03/20/2023   Acute on chronic bronchitis (HCC) 09/11/2022   Type 2 diabetes mellitus with stage 3b chronic kidney disease, without long-term current use of insulin (HCC) 05/29/2022   Hospital discharge follow-up 05/29/2022   Hyperkalemia 05/19/2022   Acute respiratory failure with hypoxia (HCC) 05/18/2022  S/P total knee arthroplasty, right 05/18/2022   Primary localized osteoarthritis of right knee 05/16/2022   Encounter for general adult medical examination with abnormal findings 05/12/2022   Preop examination 02/21/2022   Leg swelling 02/21/2022   CAD (coronary artery disease) 09/02/2021   Hyperlipidemia 04/27/2017   NSTEMI (non-ST elevated myocardial  infarction) (HCC) 04/24/2017   History of colonic polyps    Diverticulosis of colon without hemorrhage    Family history of colon cancer 06/11/2015   Chronic foot pain 03/08/2015   Idiopathic peripheral neuropathy 05/31/2014   Lumbar radiculopathy 11/21/2013   Pain in joint, shoulder region 07/21/2012   Nocturnal muscle cramps 01/19/2012   Hypertension 11/10/2011   CKD (chronic kidney disease) 11/10/2011   Osteoarthritis of knee 09/16/2011   PCP:  Anabel Halon, MD Pharmacy:   [facility] [place], [place] - [address] [place] [address] Phone: [phone] Fax: [phone]  [facility] [phone] - [place], [place] - [address] AT [facility] OF [address] [place] [address] Phone: [phone] Fax: [phone]     Social Drivers of Health (SDOH) Social History: SDOH Screenings   Food Insecurity: No Food Insecurity (07/08/2023)  Housing: Low Risk  (07/08/2023)  Transportation Needs: No Transportation Needs (07/08/2023)  Utilities: Not At Risk (07/08/2023)  Alcohol Screen: Low Risk  (10/29/2021)  Depression (PHQ2-9): Low Risk  (07/08/2023)  Financial Resource Strain: Low Risk  (10/29/2021)  Physical Activity: Inactive (10/29/2021)  Social Connections: Socially Integrated (07/08/2023)  Stress: No Stress Concern Present (10/29/2021)  Tobacco Use: Medium Risk (07/08/2023)   SDOH Interventions:     Readmission Risk Interventions    07/13/2023   11:48 AM  Readmission Risk Prevention Plan  Transportation Screening Complete  Home Care Screening Complete  Medication Review (RN CM) Complete

## 2023-07-14 ENCOUNTER — Telehealth: Payer: Self-pay

## 2023-07-14 NOTE — Transitions of Care (Post Inpatient/ED Visit) (Signed)
   07/14/2023  Name: Kyle Patel. MRN: [id number] DOB: 11/21/1945  Today's TOC FU Call Status: Today's TOC FU Call Status:: Unsuccessful Call (1st Attempt) Unsuccessful Call (1st Attempt) Date: 07/14/23  Attempted to reach the patient regarding the most recent Inpatient/ED visit.  Follow Up Plan: Additional outreach attempts will be made to reach the patient to complete the Transitions of Care (Post Inpatient/ED visit) call.   Lonia Chimera, RN, BSN, CEN [employer]- Transition of Care Team.  [facility] [phone]

## 2023-07-15 ENCOUNTER — Telehealth: Payer: Self-pay

## 2023-07-15 NOTE — Telephone Encounter (Signed)
Copied from CRM [id number]. Topic: Appointments - Appointment Scheduling >> Jul 15, 2023  2:11 PM Dondra Prader E wrote: Patient/patient representative is calling to schedule an appointment. Refer to attachments for appointment information.

## 2023-07-15 NOTE — Transitions of Care (Post Inpatient/ED Visit) (Signed)
07/15/2023  Name: Kyle Patel. MRN: [id number] DOB: 1946-01-12  Today's TOC FU Call Status: Today's TOC FU Call Status:: Successful TOC FU Call Completed TOC FU Call Complete Date: 07/15/23 Patient's Name and Date of Birth confirmed.  Transition Care Management Follow-up Telephone Call How have you been since you were released from the hospital?: Better (reports breathing is better and he is wearing his oxygen) Any questions or concerns?: No  Items Reviewed: Did you receive and understand the discharge instructions provided?: Yes Medications obtained,verified, and reconciled?: Yes (Medications Reviewed) Any new allergies since your discharge?: No Dietary orders reviewed?: Yes Type of Diet Ordered:: low sodium  heart health Do you have support at home?: Yes People in Home: spouse Name of Support/Comfort Primary Source: Earley Abide  Medications Reviewed Today: Medications Reviewed Today     Reviewed by Earlie Server, RN (Registered Nurse) on 07/15/23 at 1300  Med List Status: <None>   Medication Order Taking? Sig Documenting Provider Last Dose Status Informant  albuterol (VENTOLIN HFA) 108 (90 Base) MCG/ACT inhaler [id number] Yes Inhale 2 puffs into the lungs every 6 (six) hours as needed for wheezing or shortness of breath. Anabel Halon, MD Taking Active Self  amLODipine (NORVASC) 10 MG tablet [id number] Yes TAKE ONE TABLET BY MOUTH ONCE DAILY. Jonelle Sidle, MD Taking Active Self  amoxicillin-clavulanate (AUGMENTIN) 875-125 MG tablet [id number] Yes Take 1 tablet by mouth 2 (two) times daily for 2 days. Mdala-Gausi, Gwenette Greet, MD Taking Active   aspirin 81 MG chewable tablet [id number] Yes Chew 81 mg by mouth 2 (two) times daily. [provider] Taking Active Self  atropine 1 % ophthalmic solution [id number] Yes Place 1 drop into the right eye 3 (three) times daily. [provider] Taking Active Self           Med Note (WARD, ANGELICA G   Wed Jul 08, 2023   8:00 PM) Pt states he only takes 2 eye drops when asked, but went over med list and pt stated he takes all three of these eye drops, unsure which eye drops pt believes he is taking. States he takes one at bedtime and one twice a day.  calcitRIOL (ROCALTROL) 0.25 MCG capsule [id number] Yes Take 0.25 mcg by mouth every Monday, Wednesday, and Friday. [provider] Taking Active Self  Coenzyme Q10 100 MG TABS [id number] Yes Take 1 tablet by mouth daily. [provider] Taking Active Self  Coenzyme Q10-Vitamin E 100-150 MG-UNIT CAPS [id number] Yes Take 1 capsule by mouth at bedtime. [provider] Taking Active Self  ezetimibe (ZETIA) 10 MG tablet [id number] No TAKE (1) TABLET BY MOUTH ONCE DAILY.  Patient not taking: Reported on 07/15/2023   Jonelle Sidle, MD Not Taking Active Self           Med Note (ROSE, Lanell Matar   Wed Jul 15, 2023 12:55 PM) Currently is not taking  furosemide (LASIX) 20 MG tablet [id number] No TAKE 1 TABLET BY MOUTH ONCE DAILY.  Patient not taking: Reported on 07/15/2023   Jonelle Sidle, MD Not Taking Active Self           Med Note (ROSE, Lanell Matar   Wed Jul 15, 2023 12:56 PM) No prescription. Nephrologist did not refill  hydrALAZINE (APRESOLINE) 25 MG tablet [id number] Yes TAKE (1) TABLET BY MOUTH TWICE DAILY. Anabel Halon, MD Taking Active Self  JARDIANCE 10 MG TABS tablet [id number] No Take 10 mg by mouth 2 (two) times  a week. Monday & Friday only-start after farxiga completed  Patient not taking: Reported on 07/15/2023   [provider] Not Taking Active Self           Med Note (ROSE, Helio Lack U   Wed Jul 15, 2023 12:57 PM) Reports this was discontinued  latanoprost (XALATAN) 0.005 % ophthalmic solution [id number] Yes Place 1 drop into the left eye at bedtime. [provider] Taking Active Self           Med Note (WARD, ANGELICA G   Wed Jul 08, 2023  8:00 PM) Pt states he only takes 2 eye drops when asked, but went over med  list and pt stated he takes all three of these eye drops, unsure which eye drops pt believes he is taking. States he takes one at bedtime and one twice a day.   metoprolol succinate (TOPROL-XL) 50 MG 24 hr tablet [id number] Yes TAKE (1) TABLET BY MOUTH ONCE DAILY. TAKE WITH OR IMMEDIATELY FOLLOWING A MEAL. Jonelle Sidle, MD Taking Active Self  Misc Natural Products (OSTEO BI-FLEX ADV JOINT SHIELD) TABS [id number] Yes Take 1 tablet by mouth daily. [provider] Taking Active Self  Multiple Vitamins-Minerals (CENTRUM PO) [id number] Yes Take 1 tablet by mouth every morning. [provider] Taking Active Self  nitroGLYCERIN (NITROSTAT) 0.4 MG SL tablet [id number] No PLACE 1 TAB UNDER TONGUE EVERY 5 MIN IF NEEDED FOR CHEST PAIN. MAY USE 3 TIMES.NO RELIEF CALL 911.  Patient not taking: Reported on 07/15/2023   Jonelle Sidle, MD Not Taking Active Self           Med Note Elesa Massed, Melvern Sample Jul 08, 2023  7:56 PM) Never had to take, has at home  pregabalin (LYRICA) 200 MG capsule [id number] Yes Take 1 capsule (200 mg total) by mouth 2 (two) times daily. Anabel Halon, MD Taking Active Self  rosuvastatin (CRESTOR) 10 MG tablet [id number] Yes TAKE (1) TABLET BY MOUTH ONCE DAILY. Jonelle Sidle, MD Taking Active Self  sodium bicarbonate 650 MG tablet [id number] Yes Take 650 mg by mouth 3 (three) times daily. [provider] Taking Active Self  timolol (BETIMOL) 0.5 % ophthalmic solution [id number] Yes Place 1 drop into both eyes 2 (two) times daily. [provider] Taking Active   tobramycin (TOBREX) 0.3 % ophthalmic solution [id number] No Place 1 drop into both eyes every 6 (six) hours.  Patient not taking: Reported on 07/09/2023   [provider] Not Taking Active Self           Med Note (WARD, ANGELICA G   Wed Jul 08, 2023  8:00 PM) Pt states he only takes 2 eye drops when asked, but went over med list and pt stated he takes all three of these eye  drops, unsure which eye drops pt believes he is taking. States he takes one at bedtime and one twice a day.             Home Care and Equipment/Supplies: Were Home Health Services Ordered?: No Any new equipment or medical supplies ordered?: Yes Name of Medical supply agency?: [facility] Were you able to get the equipment/medical supplies?: Yes (oxygen) Do you have any questions related to the use of the equipment/supplies?: No  Functional Questionnaire: Do you need assistance with bathing/showering or dressing?: No Do you need assistance with meal preparation?: Yes (wife) Do you need assistance with eating?: No Do you have difficulty maintaining continence: No Do  you need assistance with getting out of bed/getting out of a chair/moving?: No Do you have difficulty managing or taking your medications?: No  Follow up appointments reviewed: PCP Follow-up appointment confirmed?: No MD Provider Line Number:[phone] Given: No (patient reports that he will call MD office to make an appointment) Specialist Hospital Follow-up appointment confirmed?: Yes Date of Specialist follow-up appointment?: 08/19/23 Follow-Up Specialty Provider:: Pulmonary Do you need transportation to your follow-up appointment?: No Do you understand care options if your condition(s) worsen?: Yes-patient verbalized understanding  SDOH Interventions Today    Flowsheet Row Most Recent Value  SDOH Interventions   Food Insecurity Interventions Intervention Not Indicated  Housing Interventions Intervention Not Indicated  Transportation Interventions Intervention Not Indicated  Utilities Interventions Intervention Not Indicated      Interventions Today    Flowsheet Row Most Recent Value  Chronic Disease   Chronic disease during today's visit Chronic Obstructive Pulmonary Disease (COPD)  General Interventions   General Interventions Discussed/Reviewed General Interventions Discussed, General Interventions  Reviewed, Sick Day Rules, Doctor Visits  Doctor Visits Discussed/Reviewed Doctor Visits Discussed  Education Interventions   Education Provided Provided Education  [encouraged patient to read his discharge instructions. Encouraged patient to make a PCP follow up appointment.  Reviewed medications and concern that patient is not taking some of his medications that he states the nephrologist discontinued.]  Provided Verbal Education On Medication, Nutrition, When to see the doctor  Nutrition Interventions   Nutrition Discussed/Reviewed Nutrition Discussed, Decreasing salt  Pharmacy Interventions   Pharmacy Dicussed/Reviewed Medications and their functions      TOC Interventions Today    Flowsheet Row Most Recent Value  TOC Interventions   TOC Interventions Discussed/Reviewed TOC Interventions Discussed, TOC Interventions Reviewed, S/S of infection       Patient reports that he is doing well. Also spoke with wife ( patient put wife on the phone) . Both patient and wife state that patient is doing well. Reports that patient has all his medications and is taking them as prescribed.   Reviewed and offer 30 TOC program and patient and wife declined. Encouraged patient to call nephrologist, and PCP for follow up appointments.   Lonia Chimera, RN, BSN, CEN [employer]- Transition of Care Team.  [facility] [phone]

## 2023-07-23 ENCOUNTER — Ambulatory Visit: Payer: Medicare Other | Admitting: Internal Medicine

## 2023-07-23 ENCOUNTER — Encounter: Payer: Self-pay | Admitting: Internal Medicine

## 2023-07-23 VITALS — BP 132/68 | HR 80 | Ht 69.5 in | Wt 218.2 lb

## 2023-07-23 DIAGNOSIS — J189 Pneumonia, unspecified organism: Secondary | ICD-10-CM | POA: Diagnosis not present

## 2023-07-23 DIAGNOSIS — Z09 Encounter for follow-up examination after completed treatment for conditions other than malignant neoplasm: Secondary | ICD-10-CM | POA: Diagnosis not present

## 2023-07-23 DIAGNOSIS — J441 Chronic obstructive pulmonary disease with (acute) exacerbation: Secondary | ICD-10-CM

## 2023-07-23 DIAGNOSIS — J9601 Acute respiratory failure with hypoxia: Secondary | ICD-10-CM | POA: Diagnosis not present

## 2023-07-23 DIAGNOSIS — J188 Other pneumonia, unspecified organism: Secondary | ICD-10-CM

## 2023-07-23 DIAGNOSIS — N1832 Chronic kidney disease, stage 3b: Secondary | ICD-10-CM

## 2023-07-23 MED ORDER — METHYLPREDNISOLONE 4 MG PO TBPK: ORAL_TABLET | ORAL | 0 refills | Status: DC

## 2023-07-23 MED ORDER — BREZTRI AEROSPHERE 160-9-4.8 MCG/ACT IN AERO: 2.0000 | INHALATION_SPRAY | Freq: Two times a day (BID) | RESPIRATORY_TRACT | Status: DC

## 2023-07-23 NOTE — Assessment & Plan Note (Signed)
 Likely from multifocal pneumonia and COPD exacerbation O2 sat on room air: 89% O2 sat upon ambulation on room air: 81%, on 2 LMP: 86%, on 4 LPM: 91% Needs to continue using home O2 Planned to see Pulmonology

## 2023-07-23 NOTE — Patient Instructions (Addendum)
 Please start taking Prednisone as prescribed.  Please start using Breztri regularly. Please use Albuterol as needed for shortness of breath or wheezing.  Please try to use maximum 2 L O2 while sitting, can increase to 4 L upon ambulation.

## 2023-07-23 NOTE — Progress Notes (Unsigned)
 Established Patient Office Visit  Subjective:  Patient ID: Kyle Shughart., male    DOB: [birth year]  Age: 78 y.o. MRN: [id number]  CC:  Chief Complaint  Patient presents with   Follow-up    Hospital f/u    HPI Kyle Molder. is a 78 y.o. male with past medical history of CAD, HTN, CKD, peripheral neuropathy and HLD who presents for follow-up after recent hospitalization from 07/08/23-07/13/23 for multifocal pneumonia.  He was sent to ER from our office in the last visit due to hypoxia.  In ER, he was found to have acute hypoxic respiratory failure due to multifocal pneumonia, and COPD exacerbation.    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   Hyperlipidemia    Pulmonary embolism (HCC) 10/2011    Past Surgical History:  Procedure Laterality Date   ABDOMINAL SURGERY     colon polyp removed 2007- sepsis, coma for 4 months per pt   BIOPSY  12/24/2020   Procedure: BIOPSY;  Surgeon: Lanelle Bal, DO;  Location: [facility];  Service: Endoscopy;;   CATARACT EXTRACTION W/PHACO Right 08/19/2021   Procedure: CATARACT EXTRACTION PHACO AND INTRAOCULAR LENS PLACEMENT (IOC);  Surgeon: Fabio Pierce, MD;  Location: [facility];  Service: Ophthalmology;  Laterality: Right;  CDE: 7.02    CATARACT EXTRACTION W/PHACO Left 09/30/2021   Procedure: CATARACT EXTRACTION PHACO AND INTRAOCULAR LENS PLACEMENT (IOC);  Surgeon: Fabio Pierce, MD;  Location: [facility];  Service: Ophthalmology;  Laterality: Left;  CDE 4.61   COLONOSCOPY  03/22/2010   RMR: 1. Normal rectum 2. Diminutive polyp at the mouth of the ileocecal valve   status post cold biopsy removal. Remaideer of the colonic mucosa and terminal ileum mucosa appeared normal.    COLONOSCOPY N/A 06/25/2015   Procedure: COLONOSCOPY;  Surgeon: Corbin Ade, MD;  Location: [facility];  Service: Endoscopy;  Laterality: N/A;  200-rescheduled 1/30 per Ginger    COLONOSCOPY WITH PROPOFOL N/A 12/24/2020   Procedure: COLONOSCOPY WITH PROPOFOL;  Surgeon: Lanelle Bal, DO;  Location: [facility];  Service: Endoscopy;  Laterality: N/A;  ASA III / 8:00   CORONARY BALLOON ANGIOPLASTY N/A 04/24/2017   Procedure: CORONARY BALLOON ANGIOPLASTY;  Surgeon: Corky Crafts, MD;  Location: [facility];  Service: Cardiovascular;  Laterality: N/A;   LEFT HEART CATH AND CORONARY ANGIOGRAPHY N/A 04/24/2017   Procedure: LEFT HEART CATH AND CORONARY ANGIOGRAPHY;  Surgeon: Corky Crafts, MD;  Location: [facility];  Service: Cardiovascular;  Laterality: N/A;   LUMBAR LAMINECTOMY  06/02/2012   LUMBAR LAMINECTOMY/DECOMPRESSION MICRODISCECTOMY  06/02/2012   Procedure: LUMBAR LAMINECTOMY/DECOMPRESSION MICRODISCECTOMY 1 LEVEL;  Surgeon: Karn Cassis, MD;  Location: [facility];  Service: Neurosurgery;  Laterality: Bilateral;  Bilateral Lumbar five-sacral one Foraminotomy and microdiskectomy    POLYPECTOMY  12/24/2020   Procedure: POLYPECTOMY;  Surgeon: Lanelle Bal, DO;  Location: [facility];  Service: Endoscopy;;   TOTAL KNEE ARTHROPLASTY Right 05/16/2022   Procedure: TOTAL KNEE ARTHROPLASTY;  Surgeon: Frederico Hamman, MD;  Location: [facility];  Service: Orthopedics;  Laterality: Right;    Family History  Problem Relation Age of Onset   Heart attack Mother    Diabetes Mother    Stroke Father    Heart attack Father    Arthritis Other    Colon cancer Sister 53       Estimated age of onset; just found out about it in 47.   Neuropathy Neg Hx     Social History   Socioeconomic History   Marital status: Married    Spouse name: Not on file    Number of children: 1   Years of education: Not on file   Highest education level: Not on file  Occupational History   Occupation: retired  Tobacco Use   Smoking status: Former    Current packs/day: 0.00    Average packs/day: 0.1 packs/day for 20.0 years (2.0 ttl pk-yrs)    Types: Cigarettes    Start date: 05/08/1979    Quit date: 05/08/1999    Years since quitting: 24.2   Smokeless tobacco: Never  Vaping Use   Vaping status: Never Used  Substance and Sexual Activity   Alcohol use: No   Drug use: No   Sexual activity: Yes  Other Topics Concern   Not on file  Social History Narrative   Patient is right handed.   Patient drinks caffeine occasionally   Social Drivers of Corporate investment banker Strain: Low Risk  (10/29/2021)   Overall Financial Resource Strain (CARDIA)    Difficulty of Paying Living Expenses: Not hard at all  Food Insecurity: No Food Insecurity (07/15/2023)   Hunger Vital Sign    Worried About Running Out of Food in the Last Year: Never true    Ran Out of Food in the Last Year: Never true  Transportation Needs: No Transportation Needs (07/15/2023)   PRAPARE - Administrator, Civil Service (Medical): No    Lack of Transportation (Non-Medical): No  Physical Activity: Inactive (10/29/2021)   Exercise Vital Sign    Days of Exercise per Week: 0 days    Minutes of Exercise per Session: 0 min  Stress: No Stress Concern Present (10/29/2021)   [employer] of Occupational Health - Occupational Stress Questionnaire    Feeling of Stress : Not at all  Social Connections: Socially Integrated (07/08/2023)   Social Connection and Isolation Panel [NHANES]    Frequency of Communication with Friends and Family: More than three times a week    Frequency of Social Gatherings with Friends and Family: More than three times a week    Attends Religious Services: More than 4 times per year    Active Member of [employer] or Organizations: Yes    Attends Museum/gallery exhibitions officer: More than 4 times per year    Marital Status: Married  Catering manager Violence: Not At Risk (07/15/2023)   Humiliation, Afraid, Rape, and Kick questionnaire    Fear of Current or Ex-Partner: No    Emotionally Abused: No    Physically Abused: No    Sexually Abused: No    Outpatient Medications Prior to Visit  Medication Sig Dispense Refill   albuterol (VENTOLIN HFA) 108 (90 Base) MCG/ACT inhaler Inhale 2 puffs into the lungs every 6 (six) hours as needed for wheezing or shortness of breath. 18 g 3   amLODipine (NORVASC) 10 MG tablet TAKE ONE TABLET BY MOUTH ONCE DAILY. 90 tablet 2   aspirin 81 MG chewable tablet Chew 81 mg  by mouth 2 (two) times daily.     atropine 1 % ophthalmic solution Place 1 drop into the right eye 3 (three) times daily.     calcitRIOL (ROCALTROL) 0.25 MCG capsule Take 0.25 mcg by mouth every Monday, Wednesday, and Friday.     Coenzyme Q10 100 MG TABS Take 1 tablet by mouth daily.     Coenzyme Q10-Vitamin E 100-150 MG-UNIT CAPS Take 1 capsule by mouth at bedtime.     hydrALAZINE (APRESOLINE) 25 MG tablet TAKE (1) TABLET BY MOUTH TWICE DAILY. 180 tablet 1   latanoprost (XALATAN) 0.005 % ophthalmic solution Place 1 drop into the left eye at bedtime.     metoprolol succinate (TOPROL-XL) 50 MG 24 hr tablet TAKE (1) TABLET BY MOUTH ONCE DAILY. TAKE WITH OR IMMEDIATELY FOLLOWING A MEAL. 90 tablet 3   Misc Natural Products (OSTEO BI-FLEX ADV JOINT SHIELD) TABS Take 1 tablet by mouth daily.     Multiple Vitamins-Minerals (CENTRUM PO) Take 1 tablet by mouth every morning.     nitroGLYCERIN (NITROSTAT) 0.4 MG SL tablet PLACE 1 TAB UNDER TONGUE EVERY 5 MIN IF NEEDED FOR CHEST PAIN. MAY USE 3 TIMES.NO RELIEF CALL 911. 25 tablet 3   pregabalin (LYRICA) 200 MG capsule Take 1 capsule (200 mg total) by mouth 2 (two) times daily. 60 capsule 5   rosuvastatin (CRESTOR) 10 MG tablet TAKE (1) TABLET BY MOUTH ONCE DAILY. 90 tablet 1   sodium bicarbonate 650 MG tablet  Take 650 mg by mouth 3 (three) times daily.     timolol (BETIMOL) 0.5 % ophthalmic solution Place 1 drop into both eyes 2 (two) times daily.     tobramycin (TOBREX) 0.3 % ophthalmic solution Place 1 drop into both eyes every 6 (six) hours.     ezetimibe (ZETIA) 10 MG tablet TAKE (1) TABLET BY MOUTH ONCE DAILY. (Patient not taking: Reported on 07/23/2023) 90 tablet 3   furosemide (LASIX) 20 MG tablet TAKE 1 TABLET BY MOUTH ONCE DAILY. (Patient not taking: Reported on 07/23/2023) 90 tablet 2   JARDIANCE 10 MG TABS tablet Take 10 mg by mouth 2 (two) times a week. Monday & Friday only-start after farxiga completed (Patient not taking: Reported on 07/23/2023)     No facility-administered medications prior to visit.    Allergies  Allergen Reactions   Levaquin [Levofloxacin] Itching   Statins Other (See Comments)    Myalgias    ROS Review of Systems    Objective:    Physical Exam  BP 132/68   Pulse 80   Ht 5' 9.5" (1.765 m)   Wt 218 lb 3.2 oz (99 kg)   SpO2 95% Comment: set to 6  BMI 31.76 kg/m  Wt Readings from Last 3 Encounters:  07/23/23 218 lb 3.2 oz (99 kg)  07/08/23 208 lb 9.6 oz (94.6 kg)  07/08/23 217 lb (98.4 kg)    Lab Results  Component Value Date   TSH 1.63 08/08/2019   Lab Results  Component Value Date   WBC 10.0 07/13/2023   HGB 12.3 (L) 07/13/2023   HCT 39.0 07/13/2023   MCV 96.5 07/13/2023   PLT 159 07/13/2023   Lab Results  Component Value Date   NA 144 07/13/2023   K 4.5 07/13/2023   CO2 24 07/13/2023   GLUCOSE 224 (H) 07/13/2023   BUN 57 (H) 07/13/2023   CREATININE 2.03 (H) 07/13/2023   BILITOT 0.9 07/08/2023   ALKPHOS 55 07/08/2023   AST 29 07/08/2023   ALT  21 07/08/2023   PROT 8.0 07/08/2023   ALBUMIN 3.6 07/08/2023   CALCIUM 8.5 (L) 07/13/2023   ANIONGAP 6 07/13/2023   EGFR 32 (L) 04/06/2023   Lab Results  Component Value Date   CHOL 176 03/20/2023   Lab Results  Component Value Date   HDL 51 03/20/2023   Lab Results  Component  Value Date   LDLCALC 102 (H) 03/20/2023   Lab Results  Component Value Date   TRIG 132 03/20/2023   Lab Results  Component Value Date   CHOLHDL 3.5 03/20/2023   Lab Results  Component Value Date   HGBA1C 6.4 (H) 03/20/2023      Assessment & Plan:   Problem List Items Addressed This Visit   None   No orders of the defined types were placed in this encounter.   Follow-up: No follow-ups on file.    Anabel Halon, MD

## 2023-07-24 NOTE — Assessment & Plan Note (Signed)
 Likely due to underlying multifocal pneumonia Completed oral Augmentin Started Medrol Dosepak due to persistent dyspnea and wheezing Needs maintenance inhaler-Breztri sample provided Follow up with pulmonology

## 2023-07-24 NOTE — Assessment & Plan Note (Signed)
 Completed IV ceftriaxone and azithromycin, followed by oral Augmentin Check repeat CXR after 4 weeks

## 2023-07-24 NOTE — Assessment & Plan Note (Signed)
 Hospital chart reviewed, including discharge summary Had acute hypoxic respiratory failure, improving now

## 2023-07-24 NOTE — Assessment & Plan Note (Signed)
 Last BMP reviewed -shows stable GFR, needs repeat BMP Followed by nephrologist On sodium bicarb and Lasix - stopped lisinopril due to hyperkalemia On Jardiance twice weekly for proteinuria

## 2023-08-09 ENCOUNTER — Other Ambulatory Visit: Payer: Self-pay | Admitting: Internal Medicine

## 2023-08-09 DIAGNOSIS — G609 Hereditary and idiopathic neuropathy, unspecified: Secondary | ICD-10-CM

## 2023-08-12 ENCOUNTER — Ambulatory Visit (HOSPITAL_COMMUNITY)
Admission: RE | Admit: 2023-08-12 | Discharge: 2023-08-12 | Disposition: A | Discharge status: Home / Self Care | Source: Ambulatory Visit | Attending: Internal Medicine | Admitting: Internal Medicine

## 2023-08-12 DIAGNOSIS — J189 Pneumonia, unspecified organism: Secondary | ICD-10-CM | POA: Diagnosis present

## 2023-08-12 DIAGNOSIS — J441 Chronic obstructive pulmonary disease with (acute) exacerbation: Secondary | ICD-10-CM | POA: Insufficient documentation

## 2023-08-15 NOTE — Patient Instructions (Incomplete)
 Marland Kitchen

## 2023-08-15 NOTE — Progress Notes (Unsigned)
 Kyle Patel., male    DOB: 1945/05/29    MRN: [id number]   Brief patient profile:  78  yobm  quit smoking 04/1999 retired Psychologist, occupational  former Kyle Patel pt referred back to pulmonary Patel in [place]  08/17/2023 by [facility] hospitalist  for new hypoxemic resp failure in setting of ? Mulitfocal pna> admit .   Kyle Patel summary 7/18/ 2014   ex smoker, seen for initial pulmonary admission 11/10/11 with Massive PE and LLE DVT  tx w/ coumadin . Hypercoagulable panel neg.  Echo showed severe pulmonary HTN, RV mildly dilated. Severe Pulm HTN w/ PAP 78  INR 3.9 on 6/28 , has follow up with Kyle Patel this week.  Hypercoagulable panel was neg  Pulmonary HTN ? Etiology , no known hx prior to admit, autoimmune workup w/ borderline RF, elevated CRP and ESR , neg ANA . ACE nml .  Mother and sister with hx of clots.  No recent surgery , prolonged immobiity, known injury or extended air/car travel.  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  Rpt echo 04/29/12 showed sign improvement w/ Nml EF , PAP pressures return to nml, RV nml size.  Underwent back surgery (lumbar laminectomy w/ decompression ) on 06/03/11 , w/ Lovonex bridge   2015 restrictive changes on pfts/ no obstruction or concavity to f/v loo    Admit date:     07/08/2023  Discharge date: 07/13/23  Discharge Physician: Kyle Patel       Discharge Diagnoses: Principal Problem:   COPD exacerbation (HCC)   CKD (chronic kidney disease)   Hypertension   Hyperlipidemia   CAD (coronary artery disease)   Acute respiratory failure with hypoxia (HCC)   Type 2 diabetes mellitus with stage 3b chronic kidney disease, without long-term current use of insulin [facility])       Hospital Course: 78 year old man with PMH of HTN, CAD, CKD, COPD who presented with shortness of breath, malaise and was found to have acute hypoxic respiratory failure due to multifocal pneumonia, COPD exacerbation.      Acute hypoxic respiratory failure due to COPD  exacerbation, multifocal pneumonia. No oxygen use at baseline. Patient required up to 8 L/min supplemental oxygen. He was weaned down to 4L/min at rest and 6L/min with ambulation.  Home oxygen was arranged.    Community-acquired pneumonia  CTA PE done on admission revealed bilateral densities - multifocal pneumonia.  Negative for PE.  Emphysematous changes noted. RVP was negative Patient was treated with azithromycin, ceftriaxone while in hospital and discharged on Augmentin.   COPD exacerbation Patient says his COPD is not being actively managed. He does have an inhaler at home that he was given recently. He was treated with DuoNebs, steroids, antibiotics as above. An ambulatory referral to pulmonology was placed.   CAD Troponin was not elevated on admission. Patient denied chest pain. Continued home aspirin, statin, beta-blockers.   Prediabetes Home Jardiance was resumed at discharge.   Peripheral neuropathy Continued home Lyrica.   Stage III CKD Baseline creatinine appears to be 2-2.3. Patient's renal function remained at baseline.       albuterol 108 (90 Base) MCG/ACT inhaler Commonly known as: VENTOLIN HFA Inhale 2 puffs into the lungs every 6 (six) hours as needed for wheezing or shortness of breath.    amLODipine 10 MG tablet Commonly known as: NORVASC TAKE ONE TABLET BY MOUTH ONCE DAILY.    amoxicillin-clavulanate 875-125 MG tablet Commonly known as: AUGMENTIN Take 1 tablet by mouth 2 (  two) times daily for 2 days.    aspirin 81 MG chewable tablet Chew 81 mg by mouth 2 (two) times daily.    atropine 1 % ophthalmic solution Place 1 drop into the right eye 3 (three) times daily.    calcitRIOL 0.25 MCG capsule Commonly known as: ROCALTROL Take 0.25 mcg by mouth every Monday, Wednesday, and Friday.    CENTRUM PO Take 1 tablet by mouth every morning.    Coenzyme Q10 100 MG Tabs Take 1 tablet by mouth daily.    Coenzyme Q10-Vitamin E 100-150 MG-UNIT  Caps Take 1 capsule by mouth at bedtime.    ezetimibe 10 MG tablet Commonly known as: ZETIA TAKE (1) TABLET BY MOUTH ONCE DAILY.    furosemide 20 MG tablet Commonly known as: LASIX TAKE 1 TABLET BY MOUTH ONCE DAILY.    hydrALAZINE 25 MG tablet Commonly known as: APRESOLINE TAKE (1) TABLET BY MOUTH TWICE DAILY.    Jardiance 10 MG Tabs tablet Generic drug: empagliflozin Take 10 mg by mouth 2 (two) times a week. Monday & Friday only-start after farxiga completed    latanoprost 0.005 % ophthalmic solution Commonly known as: XALATAN Place 1 drop into the left eye at bedtime.    metoprolol succinate 50 MG 24 hr tablet Commonly known as: TOPROL-XL TAKE (1) TABLET BY MOUTH ONCE DAILY. TAKE WITH OR IMMEDIATELY FOLLOWING A MEAL.    nitroGLYCERIN 0.4 MG SL tablet Commonly known as: NITROSTAT PLACE 1 TAB UNDER TONGUE EVERY 5 MIN IF NEEDED FOR CHEST PAIN. MAY USE 3 TIMES.NO RELIEF CALL 911.    Osteo Bi-Flex Adv Joint Shield Tabs Take 1 tablet by mouth daily.    pregabalin 200 MG capsule Commonly known as: LYRICA Take 1 capsule (200 mg total) by mouth 2 (two) times daily.    rosuvastatin 10 MG tablet Commonly known as: CRESTOR TAKE (1) TABLET BY MOUTH ONCE DAILY.    sodium bicarbonate 650 MG tablet Take 650 mg by mouth 3 (three) times daily.    timolol 0.5 % ophthalmic solution Commonly known as: BETIMOL Place 1 drop into both eyes 2 (two) times daily.    tobramycin 0.3 % ophthalmic solution Commonly known as: TOBREX Place 1 drop into both eyes every 6 (six) hours.         History of Present Illness  08/17/2023  Pulmonary/ 1st office eval/ Kyle Patel / [place] Office timolol/ hydralazine / has avasc necrosis ? SS?  Chief Complaint  Patient presents with   Establish Care    Last seen 2014  Dyspnea:  room to room on 2lpm (not sure)  feels losing ground vs admit  Cough: mucus is white / 2 coughs to clear each am Sleep: flat in bd 1-2 pillows s orthopnea/ pnd or noct cough   SABA use: breztri no better  02 use:24/7 usually 2lpm does not titrate     No obvious day to day or daytime pattern/variability or assoc  mucus plugs or hemoptysis or cp or chest tightness, subjective wheeze or overt sinus or hb symptoms.    Also denies any obvious fluctuation of symptoms with weather or environmental changes or other aggravating or alleviating factors except as outlined above   No unusual exposure hx or h/o childhood pna/ asthma or knowledge of premature birth.  Current Allergies, Complete Past Medical History, Past Surgical History, Family History, and Social History were reviewed in [employer] record.  ROS  The following are not active complaints unless bolded Hoarseness, sore throat, dysphagia,  dental problems, itching, sneezing,  nasal congestion or discharge of excess mucus or purulent secretions, ear ache,   fever, chills, sweats, unintended wt loss or wt gain, classically pleuritic or exertional cp,  orthopnea pnd or arm/hand swelling  or leg swelling, presyncope, palpitations, abdominal pain, anorexia, nausea, vomiting, diarrhea  or change in bowel habits or change in bladder habits, change in stools or change in urine, dysuria, hematuria,  rash, arthralgias, visual complaints, headache, numbness, weakness or ataxia or problems with walking or coordination,  change in mood or  memory.            Outpatient Medications Prior to Visit  Medication Sig Dispense Refill   amLODipine (NORVASC) 10 MG tablet TAKE ONE TABLET BY MOUTH ONCE DAILY. 90 tablet 2   aspirin 81 MG chewable tablet Chew 81 mg by mouth 2 (two) times daily.     atropine 1 % ophthalmic solution Place 1 drop into the right eye 3 (three) times daily.     Budeson-Glycopyrrol-Formoterol (BREZTRI AEROSPHERE) 160-9-4.8 MCG/ACT AERO Inhale 2 puffs into the lungs 2 (two) times daily.     calcitRIOL (ROCALTROL) 0.25 MCG capsule Take 0.25 mcg by mouth every Monday, Wednesday, and Friday.      Coenzyme Q10 100 MG TABS Take 1 tablet by mouth daily.     Coenzyme Q10-Vitamin E 100-150 MG-UNIT CAPS Take 1 capsule by mouth at bedtime.     FARXIGA 5 MG TABS tablet Take 5 mg by mouth daily.     hydrALAZINE (APRESOLINE) 25 MG tablet TAKE (1) TABLET BY MOUTH TWICE DAILY. 180 tablet 1   latanoprost (XALATAN) 0.005 % ophthalmic solution Place 1 drop into the left eye at bedtime.     methylPREDNISolone (MEDROL DOSEPAK) 4 MG TBPK tablet Take as package instructions. 1 each 0   metoprolol succinate (TOPROL-XL) 50 MG 24 hr tablet TAKE (1) TABLET BY MOUTH ONCE DAILY. TAKE WITH OR IMMEDIATELY FOLLOWING A MEAL. 90 tablet 3   Misc Natural Products (OSTEO BI-FLEX ADV JOINT SHIELD) TABS Take 1 tablet by mouth daily.     Multiple Vitamins-Minerals (CENTRUM PO) Take 1 tablet by mouth every morning.     pregabalin (LYRICA) 200 MG capsule Take 1 capsule (200 mg total) by mouth 2 (two) times daily. 60 capsule 5   rosuvastatin (CRESTOR) 10 MG tablet TAKE (1) TABLET BY MOUTH ONCE DAILY. 90 tablet 1   sodium bicarbonate 650 MG tablet Take 650 mg by mouth 3 (three) times daily.     timolol (BETIMOL) 0.5 % ophthalmic solution Place 1 drop into both eyes 2 (two) times daily.     tobramycin (TOBREX) 0.3 % ophthalmic solution Place 1 drop into both eyes every 6 (six) hours.     albuterol (VENTOLIN HFA) 108 (90 Base) MCG/ACT inhaler Inhale 2 puffs into the lungs every 6 (six) hours as needed for wheezing or shortness of breath. (Patient not taking: Reported on 08/17/2023) 18 g 3   ezetimibe (ZETIA) 10 MG tablet TAKE (1) TABLET BY MOUTH ONCE DAILY. (Patient not taking: No sig reported) 90 tablet 3   furosemide (LASIX) 20 MG tablet TAKE 1 TABLET BY MOUTH ONCE DAILY. (Patient not taking: Reported on 07/23/2023) 90 tablet 2   nitroGLYCERIN (NITROSTAT) 0.4 MG SL tablet PLACE 1 TAB UNDER TONGUE EVERY 5 MIN IF NEEDED FOR CHEST PAIN. MAY USE 3 TIMES.NO RELIEF CALL 911. (Patient not taking: Reported on 08/17/2023) 25 tablet 3    JARDIANCE 10 MG TABS tablet Take 10 mg by mouth  2 (two) times a week. Monday & Friday only-start after farxiga completed (Patient not taking: Reported on 07/23/2023)     No facility-administered medications prior to visit.    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   Hyperlipidemia    Pulmonary embolism (HCC) 10/2011      Objective:     BP (!) 144/63   Pulse 62   Ht 5' 9.5" (1.765 m)   Wt 217 lb (98.4 kg)   SpO2 (!) 84% Comment: 2L CONT  BMI 31.59 kg/m   SpO2: (!) 84 % (2L CONT)  Somber stoic mod obese (by BMI) bm nad    HEENT : Oropharynx  clear      Nasal turbinates nl    NECK :  without  apparent JVD/ palpable Nodes/TM    LUNGS: no acc muscle use,  Nl contour chest with coarsened insp/ exp Breath sounds s audible wheeze    CV:  RRR  no s3 or murmur or increase in P2, and no edema   ABD:  soft and nontender   MS:  Gait slow and slt awkawrd (muscle pull)    ext warm without deformities Or obvious joint restrictions  calf tenderness, cyanosis or clubbing    SKIN: warm and dry without lesions    NEURO:  alert, approp, nl sensorium with  no motor or cerebellar deficits apparent.   CXR PA and Lateral:  08/22/23   I personally reviewed images and impression is as follows:     Cm with diffused infiltrates   I personally reviewed images and agree with radiology impression as follows:   Chest CTa    07/08/23  1. Negative for acute pulmonary embolus or aortic dissection. 2. Emphysema with background of bilateral bronchial wall thickening and mild reticulation. More acute appearing  superimposed bilateral ground-glass densities right greater than left, suspect for multifocal pneumonia. Imaging follow-up to resolution is recommended. 3. Mild mediastinal and right hilar adenopathy, slightly increased, and likely reactive. 4. Aortic atherosclerosis.       Assessment   DOE (dyspnea on exertion) Onset of symptoms about 10 d prior to admit 07/08/23 with dx of ARF/ CAP - see chronic resp failure a/p - Collagen vasc dz profile 08/17/2023  - ACE level 08/17/2023 - Quant TB 08/17/2023   DDX: Miscellaneous:Alv microlithiasis, alv proteinosis, asp, bronchiectasis, BOOP   ARDS/ AIP Occupational dz/ HSP Neoplasm Infection> ruled out x for PCP Drug :  hydralazine > check ana  Pulmonary emboli, Protein disorders> send d dimer  Edema/Eosinophilic dz> send labs Sarcoidosis > send labs Connective tissue dz> send labs  Hist X / Hemorrhage Idiopathic  Will add SS dz since he has avascular necrosis shoulders    Will also need HRCT and possibly lung bx if not able to narrow down ddx.     Chronic respiratory failure with hypoxia [facility]) S/p PE  2014 s residual PH  - new doe 06/2023 > see admit 07/08/23  - 08/17/2023   Walked on 4lpm  x   approx 75  ft  @ slow pace,  stopped due to desats with lowest 02 sats 82% then increased to 6lpm to get sats to 84% and 8 lpm  to get to 88% and complete another 75 ft    Rec increase 02 to 4lpm but up to 6-8 lpm with activity.  If condition worsens go to [facility] instead of returning to [facility] as nothing else to offer here if this deteriorating.   Each maintenance medication was reviewed in detail including emphasizing most importantly the difference between maintenance and prns and under what circumstances the prns are to be triggered using an action plan format where appropriate.  Total time for H and P, chart review, counseling, reviewing hfa  device(s) , directly observing portions of ambulatory 02 saturation study/ and generating customized  AVS unique to this office visit / same day charting = 60 min complex new pt eval.                Sandrea Hughs, MD 08/17/2023

## 2023-08-17 ENCOUNTER — Ambulatory Visit: Payer: Medicare Other | Admitting: Internal Medicine

## 2023-08-17 ENCOUNTER — Encounter: Payer: Self-pay | Admitting: Internal Medicine

## 2023-08-17 VITALS — BP 144/63 | HR 62 | Ht 69.5 in | Wt 217.0 lb

## 2023-08-17 DIAGNOSIS — J9611 Chronic respiratory failure with hypoxia: Secondary | ICD-10-CM

## 2023-08-17 DIAGNOSIS — Z87891 Personal history of nicotine dependence: Secondary | ICD-10-CM

## 2023-08-17 DIAGNOSIS — R0609 Other forms of dyspnea: Secondary | ICD-10-CM | POA: Insufficient documentation

## 2023-08-17 DIAGNOSIS — Z9981 Dependence on supplemental oxygen: Secondary | ICD-10-CM | POA: Diagnosis not present

## 2023-08-17 NOTE — Assessment & Plan Note (Signed)
 Onset of symptoms about 10 d prior to admit 07/08/23 with dx of ARF/ CAP - see chronic resp failure a/p - Collagen vasc dz profile 08/17/2023  - ACE level 08/17/2023 - Quant TB 08/17/2023   DDX: Miscellaneous:Alv microlithiasis, alv proteinosis, asp, bronchiectasis, BOOP   ARDS/ AIP Occupational dz/ HSP Neoplasm Infection> ruled out x for PCP Drug :  hydralazine > check ana  Pulmonary emboli, Protein disorders> send d dimer  Edema/Eosinophilic dz> send labs Sarcoidosis > send labs Connective tissue dz> send labs  Hist X / Hemorrhage Idiopathic  Will add SS dz since he has avascular necrosis shoulders    Will also need HRCT and possibly lung bx if not able to narrow down ddx.

## 2023-08-17 NOTE — Assessment & Plan Note (Signed)
 S/p PE  2014 s residual PH  - new doe 06/2023 > see admit 07/08/23  - 08/17/2023   Walked on 4lpm  x   approx 75  ft  @ slow pace, stopped due to desats with lowest 02 sats 82% then increased to 6lpm to get sats to 84% and 8 lpm  to get to 88% and complete another 75 ft    Rec increase 02 to 4lpm but up to 6-8 lpm with activity.  If condition worsens go to [facility] hosp instead of returning to [facility] as nothing else to offer here if this deteriorating.   Each maintenance medication was reviewed in detail including emphasizing most importantly the difference between maintenance and prns and under what circumstances the prns are to be triggered using an action plan format where appropriate.  Total time for H and P, chart review, counseling, reviewing hfa  device(s) , directly observing portions of ambulatory 02 saturation study/ and generating customized AVS unique to this office visit / same day charting = 60 min complex new pt eval.

## 2023-08-19 ENCOUNTER — Ambulatory Visit: Payer: Medicare Other | Admitting: Internal Medicine

## 2023-08-20 ENCOUNTER — Ambulatory Visit: Payer: Medicare Other | Admitting: Internal Medicine

## 2023-08-20 ENCOUNTER — Encounter: Payer: Self-pay | Admitting: Internal Medicine

## 2023-08-20 VITALS — BP 148/76 | HR 82 | Wt 217.6 lb

## 2023-08-20 DIAGNOSIS — I10 Essential (primary) hypertension: Secondary | ICD-10-CM

## 2023-08-20 DIAGNOSIS — N1832 Chronic kidney disease, stage 3b: Secondary | ICD-10-CM | POA: Diagnosis not present

## 2023-08-20 DIAGNOSIS — G609 Hereditary and idiopathic neuropathy, unspecified: Secondary | ICD-10-CM

## 2023-08-20 DIAGNOSIS — Z7984 Long term (current) use of oral hypoglycemic drugs: Secondary | ICD-10-CM

## 2023-08-20 DIAGNOSIS — E782 Mixed hyperlipidemia: Secondary | ICD-10-CM | POA: Diagnosis not present

## 2023-08-20 DIAGNOSIS — E1122 Type 2 diabetes mellitus with diabetic chronic kidney disease: Secondary | ICD-10-CM | POA: Diagnosis not present

## 2023-08-20 DIAGNOSIS — J9611 Chronic respiratory failure with hypoxia: Secondary | ICD-10-CM

## 2023-08-20 LAB — ANA: Anti Nuclear Antibody (ANA): NEGATIVE

## 2023-08-20 LAB — ANGIOTENSIN CONVERTING ENZYME: Angio Convert Enzyme: 40 U/L (ref 14–82)

## 2023-08-20 LAB — RHEUMATOID FACTOR: Rheumatoid fact SerPl-aCnc: <10 [IU]/mL (ref <14.0)

## 2023-08-20 LAB — SPECIMEN STATUS REPORT

## 2023-08-20 MED ORDER — OLMESARTAN MEDOXOMIL 5 MG PO TABS
5.0000 mg | ORAL_TABLET | Freq: Every day | ORAL | 2 refills | Status: DC
Start: 2023-08-20 — End: 2023-09-18

## 2023-08-20 NOTE — Progress Notes (Signed)
 Established Patient Office Visit  Subjective:  Patient ID: Kyle Spira., male    DOB: 10/31/1945  Age: 78 y.o. MRN: [id number]  CC:  Chief Complaint  Patient presents with   Care Management    4 week f/u    HPI Kyle Patel. is a 78 y.o. male with past medical history of CAD, HTN, CKD, peripheral neuropathy and HLD who presents for f/u of his chronic medical conditions.  Chronic respiratory failure: He was evaluated by Dr. Sherene Sires for recent worsening of his breathing.  He was admitted at [facility] in the last month for COPD exacerbation and multifocal pneumonia.  He has been oxygen dependent since then, at 4 lpm at rest and 6 lpm upon ambulation currently.  Denies any fever or chills.  He has been placed on Breztri for COPD.  HTN and CAD: He has had stent placed for history of NSTEMI in 2018.  He is on aspirin and statin currently. BP is elevated today. Takes medications regularly. Patient denies headache, dizziness, chest pain, or palpitations.  CKD stage IIIb: Followed by Dr. Wolfgang Phoenix.  His last BMP showed GFR of 34 in 03/24, which was stable compared to his baseline. He has seen Dr. Wolfgang Phoenix in 12/24.  He currently denies any dysuria, hematuria, urinary hesitance or resistance.  Type II DM: He is currently on Farxiga 5 mg QD for CKD.  His HbA1c was 5.9 in 04/24.  He denies polyuria or polyphagia currently.  He has history of peripheral neuropathy, for which he takes Lyrica 200 mg twice daily.  He has chronic burning pain in his feet and also complains of intermittent weakness of the LE, but have improved since increasing dose of Lyrica.  Denies any recent fall.    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   Hyperlipidemia    Pulmonary embolism (HCC) 10/2011    Past Surgical History:  Procedure Laterality Date   ABDOMINAL SURGERY     colon polyp removed 2007- sepsis, coma for 4 months per pt   BIOPSY  12/24/2020   Procedure: BIOPSY;  Surgeon: Lanelle Bal, DO;  Location: [facility];  Service: Endoscopy;;   CATARACT EXTRACTION W/PHACO Right 08/19/2021   Procedure: CATARACT EXTRACTION PHACO AND INTRAOCULAR LENS PLACEMENT (IOC);  Surgeon: Fabio Pierce, MD;  Location: [facility] OR[facility];  Service: Ophthalmology;  Laterality: Right;  CDE: 7.02    CATARACT EXTRACTION W/PHACO Left 09/30/2021   Procedure: CATARACT EXTRACTION PHACO AND INTRAOCULAR LENS PLACEMENT (IOC);  Surgeon: Fabio Pierce, MD;  Location: [facility];  Service: Ophthalmology;  Laterality: Left;  CDE 4.61   COLONOSCOPY  03/22/2010   RMR: 1. Normal rectum 2. Diminutive polyp at the mouth of the ileocecal valve  status post cold biopsy removal. Remaideer of the colonic mucosa and terminal ileum mucosa appeared normal.    COLONOSCOPY N/A 06/25/2015   Procedure: COLONOSCOPY;  Surgeon: Corbin Ade, MD;  Location: [facility];  Service: Endoscopy;  Laterality: N/A;  200-rescheduled 1/30 per Ginger    COLONOSCOPY WITH PROPOFOL N/A  12/24/2020   Procedure: COLONOSCOPY WITH PROPOFOL;  Surgeon: Lanelle Bal, DO;  Location: [facility];  Service: Endoscopy;  Laterality: N/A;  ASA III / 8:00   CORONARY BALLOON ANGIOPLASTY N/A 04/24/2017   Procedure: CORONARY BALLOON ANGIOPLASTY;  Surgeon: Corky Crafts, MD;  Location: [facility] INVASIVE CV LAB;  Service: Cardiovascular;  Laterality: N/A;   LEFT HEART CATH AND CORONARY ANGIOGRAPHY N/A 04/24/2017   Procedure: LEFT HEART CATH AND CORONARY ANGIOGRAPHY;  Surgeon: Corky Crafts, MD;  Location: [facility] INVASIVE CV LAB;  Service: Cardiovascular;  Laterality: N/A;   LUMBAR  LAMINECTOMY  06/02/2012   LUMBAR LAMINECTOMY/DECOMPRESSION MICRODISCECTOMY  06/02/2012   Procedure: LUMBAR LAMINECTOMY/DECOMPRESSION MICRODISCECTOMY 1 LEVEL;  Surgeon: Karn Cassis, MD;  Location: [facility] NEURO OR[facility];  Service: Neurosurgery;  Laterality: Bilateral;  Bilateral Lumbar five-sacral one Foraminotomy and microdiskectomy    POLYPECTOMY  12/24/2020   Procedure: POLYPECTOMY;  Surgeon: Lanelle Bal, DO;  Location: [facility];  Service: Endoscopy;;   TOTAL KNEE ARTHROPLASTY Right 05/16/2022   Procedure: TOTAL KNEE ARTHROPLASTY;  Surgeon: Frederico Hamman, MD;  Location: [facility] ORS;  Service: Orthopedics;  Laterality: Right;    Family History  Problem Relation Age of Onset   Heart attack Mother    Diabetes Mother    Stroke Father    Heart attack Father    Arthritis Other    Colon cancer Sister 83       Estimated age of onset; just found out about it in 42.   Neuropathy Neg Hx     Social History   Socioeconomic History   Marital status: Married    Spouse name: Not on file   Number of children: 1   Years of education: Not on file   Highest education level: Not on file  Occupational History   Occupation: retired  Tobacco Use   Smoking status: Former    Current packs/day: 0.00    Average packs/day: 0.1 packs/day for 20.0 years (2.0 ttl pk-yrs)    Types: Cigarettes    Start date: 05/08/1979    Quit date: 05/08/1999    Years since quitting: 24.3   Smokeless tobacco: Never  Vaping Use   Vaping status: Never Used  Substance and Sexual Activity   Alcohol use: No   Drug use: No   Sexual activity: Yes  Other Topics Concern   Not on file  Social History Narrative   Patient is right handed.   Patient drinks caffeine occasionally   Social Drivers of Corporate investment banker Strain: Low Risk  (10/29/2021)   Overall Financial Resource Strain (CARDIA)    Difficulty of Paying Living Expenses: Not hard at all  Food Insecurity: No Food Insecurity (07/15/2023)   Hunger Vital  Sign    Worried About Running Out of Food in the Last Year: Never true    Ran Out of Food in the Last Year: Never true  Transportation Needs: No Transportation Needs (07/15/2023)   PRAPARE - Administrator, Civil Service (Medical): No    Lack of Transportation (Non-Medical): No  Physical Activity: Inactive (10/29/2021)   Exercise Vital Sign    Days of Exercise per Week: 0 days    Minutes of Exercise per Session: 0 min  Stress: No Stress Concern Present (10/29/2021)   [employer] of Occupational Health - Occupational Stress Questionnaire    Feeling of Stress : Not at all  Social Connections: Socially Integrated (07/08/2023)   Social Connection and Isolation Panel [NHANES]  Frequency of Communication with Friends and Family: More than three times a week    Frequency of Social Gatherings with Friends and Family: More than three times a week    Attends Religious Services: More than 4 times per year    Active Member of [employer] or Organizations: Yes    Attends Engineer, structural: More than 4 times per year    Marital Status: Married  Catering manager Violence: Not At Risk (07/15/2023)   Humiliation, Afraid, Rape, and Kick questionnaire    Fear of Current or Ex-Partner: No    Emotionally Abused: No    Physically Abused: No    Sexually Abused: No    Outpatient Medications Prior to Visit  Medication Sig Dispense Refill   albuterol (VENTOLIN HFA) 108 (90 Base) MCG/ACT inhaler Inhale 2 puffs into the lungs every 6 (six) hours as needed for wheezing or shortness of breath. 18 g 3   amLODipine (NORVASC) 10 MG tablet TAKE ONE TABLET BY MOUTH ONCE DAILY. 90 tablet 2   aspirin 81 MG chewable tablet Chew 81 mg by mouth 2 (two) times daily.     atropine 1 % ophthalmic solution Place 1 drop into the right eye 3 (three) times daily.     Budeson-Glycopyrrol-Formoterol (BREZTRI AEROSPHERE) 160-9-4.8 MCG/ACT AERO Inhale 2 puffs into the lungs 2 (two) times daily.     calcitRIOL  (ROCALTROL) 0.25 MCG capsule Take 0.25 mcg by mouth every Monday, Wednesday, and Friday.     Coenzyme Q10 100 MG TABS Take 1 tablet by mouth daily.     Coenzyme Q10-Vitamin E 100-150 MG-UNIT CAPS Take 1 capsule by mouth at bedtime.     ezetimibe (ZETIA) 10 MG tablet TAKE (1) TABLET BY MOUTH ONCE DAILY. 90 tablet 3   FARXIGA 5 MG TABS tablet Take 5 mg by mouth daily.     furosemide (LASIX) 20 MG tablet TAKE 1 TABLET BY MOUTH ONCE DAILY. 90 tablet 2   hydrALAZINE (APRESOLINE) 25 MG tablet TAKE (1) TABLET BY MOUTH TWICE DAILY. 180 tablet 1   latanoprost (XALATAN) 0.005 % ophthalmic solution Place 1 drop into the left eye at bedtime.     metoprolol succinate (TOPROL-XL) 50 MG 24 hr tablet TAKE (1) TABLET BY MOUTH ONCE DAILY. TAKE WITH OR IMMEDIATELY FOLLOWING A MEAL. 90 tablet 3   Misc Natural Products (OSTEO BI-FLEX ADV JOINT SHIELD) TABS Take 1 tablet by mouth daily.     Multiple Vitamins-Minerals (CENTRUM PO) Take 1 tablet by mouth every morning.     nitroGLYCERIN (NITROSTAT) 0.4 MG SL tablet PLACE 1 TAB UNDER TONGUE EVERY 5 MIN IF NEEDED FOR CHEST PAIN. MAY USE 3 TIMES.NO RELIEF CALL 911. 25 tablet 3   pregabalin (LYRICA) 200 MG capsule Take 1 capsule (200 mg total) by mouth 2 (two) times daily. 60 capsule 5   rosuvastatin (CRESTOR) 10 MG tablet TAKE (1) TABLET BY MOUTH ONCE DAILY. 90 tablet 1   sodium bicarbonate 650 MG tablet Take 650 mg by mouth 3 (three) times daily.     timolol (BETIMOL) 0.5 % ophthalmic solution Place 1 drop into both eyes 2 (two) times daily.     tobramycin (TOBREX) 0.3 % ophthalmic solution Place 1 drop into both eyes every 6 (six) hours.     methylPREDNISolone (MEDROL DOSEPAK) 4 MG TBPK tablet Take as package instructions. 1 each 0   No facility-administered medications prior to visit.    Allergies  Allergen Reactions   Levaquin [Levofloxacin] Itching   Statins Other (  See Comments)    Myalgias    ROS Review of Systems  Constitutional:  Positive for fatigue.  Negative for chills and fever.  HENT:  Negative for congestion and sore throat.   Eyes:  Negative for pain and discharge.  Respiratory:  Positive for shortness of breath (Intermittent, upon exertion) and wheezing. Negative for cough.   Cardiovascular:  Negative for chest pain and palpitations.  Gastrointestinal:  Negative for diarrhea, nausea and vomiting.  Endocrine: Negative for polydipsia and polyuria.  Genitourinary:  Negative for dysuria and hematuria.  Musculoskeletal:  Negative for neck pain and neck stiffness.  Skin:  Negative for rash.  Neurological:  Positive for weakness and numbness. Negative for dizziness and headaches.  Psychiatric/Behavioral:  Negative for agitation and behavioral problems.       Objective:    Physical Exam Vitals reviewed.  Constitutional:      General: He is not in acute distress.    Appearance: He is not diaphoretic.  HENT:     Head: Normocephalic and atraumatic.     Nose: Nose normal.     Mouth/Throat:     Mouth: Mucous membranes are moist.  Eyes:     General: No scleral icterus.    Extraocular Movements: Extraocular movements intact.  Cardiovascular:     Rate and Rhythm: Normal rate and regular rhythm.     Pulses: Normal pulses.     Heart sounds: Normal heart sounds. No murmur heard. Pulmonary:     Breath sounds: No wheezing or rales.     Comments: On 4 l O2 in office Musculoskeletal:     Cervical back: Neck supple. No tenderness.     Right lower leg: No edema.     Left lower leg: No edema.  Skin:    General: Skin is warm.     Findings: No rash.  Neurological:     General: No focal deficit present.     Mental Status: He is alert and oriented to person, place, and time.     Sensory: Sensory deficit (B/l feet) present.     Motor: Weakness (4/5 in b/l LE) present.     Gait: Gait abnormal (slow).  Psychiatric:        Mood and Affect: Mood normal.        Behavior: Behavior normal.     BP (!) 148/76 (BP Location: Left Arm)    Pulse 82   Wt 217 lb 9.6 oz (98.7 kg)   SpO2 91% Comment: set on 6  BMI 31.67 kg/m  Wt Readings from Last 3 Encounters:  08/20/23 217 lb 9.6 oz (98.7 kg)  08/17/23 217 lb (98.4 kg)  07/23/23 218 lb 3.2 oz (99 kg)    Lab Results  Component Value Date   TSH 1.63 08/08/2019   Lab Results  Component Value Date   WBC 10.0 07/13/2023   HGB 12.3 (L) 07/13/2023   HCT 39.0 07/13/2023   MCV 96.5 07/13/2023   PLT 159 07/13/2023   Lab Results  Component Value Date   NA 144 07/13/2023   K 4.5 07/13/2023   CO2 24 07/13/2023   GLUCOSE 224 (H) 07/13/2023   BUN 57 (H) 07/13/2023   CREATININE 2.03 (H) 07/13/2023   BILITOT 0.9 07/08/2023   ALKPHOS 55 07/08/2023   AST 29 07/08/2023   ALT 21 07/08/2023   PROT 8.0 07/08/2023   ALBUMIN 3.6 07/08/2023   CALCIUM 8.5 (L) 07/13/2023   ANIONGAP 6 07/13/2023   EGFR 32 (L) 04/06/2023  Lab Results  Component Value Date   CHOL 176 03/20/2023   Lab Results  Component Value Date   HDL 51 03/20/2023   Lab Results  Component Value Date   LDLCALC 102 (H) 03/20/2023   Lab Results  Component Value Date   TRIG 132 03/20/2023   Lab Results  Component Value Date   CHOLHDL 3.5 03/20/2023   Lab Results  Component Value Date   HGBA1C 6.4 (H) 03/20/2023      Assessment & Plan:   Problem List Items Addressed This Visit       Cardiovascular and Mediastinum   Hypertension   BP Readings from Last 1 Encounters:  08/20/23 (!) 152/72   Uncontrolled with amlodipine 10 mg QD and metoprolol 50 mg QD and hydralazine 25 mg BID Had stopped lisinopril 10 mg once daily due to AKI and hyperkalemia, which have resolved now - started Olmesartan 5 mg once daily for now Counseled for compliance with the medications Advised DASH diet and moderate exercise/walking as tolerated      Relevant Medications   olmesartan (BENICAR) 5 MG tablet     Respiratory   Chronic respiratory failure with hypoxia (HCC)   Likely from COPD, worse recently Needs to  continue using home O2 for now Followed by Pulmonology        Endocrine   Type 2 diabetes mellitus with stage 3b chronic kidney disease, without long-term current use of insulin (HCC) - Primary   Lab Results  Component Value Date   HGBA1C 6.4 (H) 03/20/2023    Well controlled On Farxiga 5 mg once daily - for CKD as well Advised to follow diabetic diet On statin and ACEi F/u CMP and lipid panel Diabetic eye exam: Advised to follow up with Ophthalmology for diabetic eye exam      Relevant Medications   olmesartan (BENICAR) 5 MG tablet     Nervous and Auditory   Idiopathic peripheral neuropathy   On Lyrica 200 mg BID Better controlled now        Genitourinary   CKD (chronic kidney disease) stage 3, GFR 30-59 ml/min (HCC)   Last BMP reviewed -shows stable GFR, around 35 now Followed by nephrologist On sodium bicarb and Lasix Started Olmesartan 5 mg once daily for now On Farxiga 5 mg once daily for proteinuria        Other   Hyperlipidemia (Chronic)   On statin and Zetia currently      Relevant Medications   olmesartan (BENICAR) 5 MG tablet    Meds ordered this encounter  Medications   olmesartan (BENICAR) 5 MG tablet    Sig: Take 1 tablet (5 mg total) by mouth daily.    Dispense:  30 tablet    Refill:  2    Follow-up: Return in about 3 months (around 11/20/2023) for Annual physical.    Anabel Halon, MD

## 2023-08-20 NOTE — Assessment & Plan Note (Addendum)
 Lab Results  Component Value Date   HGBA1C 6.4 (H) 03/20/2023    Well controlled On Farxiga 5 mg once daily - for CKD as well Advised to follow diabetic diet On statin and ACEi F/u CMP and lipid panel Diabetic eye exam: Advised to follow up with Ophthalmology for diabetic eye exam

## 2023-08-20 NOTE — Assessment & Plan Note (Signed)
 Last BMP reviewed -shows stable GFR, around 35 now Followed by nephrologist On sodium bicarb and Lasix Started Olmesartan 5 mg once daily for now On Farxiga 5 mg once daily for proteinuria

## 2023-08-20 NOTE — Patient Instructions (Signed)
 Please start taking Olmesartan 5 mg once daily.  Please follow up with Dr Wolfgang Phoenix as scheduled.  Please continue taking other medications as prescribed.  Please perform leg elevation for leg swelling.

## 2023-08-20 NOTE — Assessment & Plan Note (Signed)
 On Lyrica 200 mg BID Better controlled now

## 2023-08-20 NOTE — Assessment & Plan Note (Signed)
On statin and Zetia currently 

## 2023-08-20 NOTE — Assessment & Plan Note (Addendum)
 BP Readings from Last 1 Encounters:  08/20/23 (!) 152/72   Uncontrolled with amlodipine 10 mg QD and metoprolol 50 mg QD and hydralazine 25 mg BID Had stopped lisinopril 10 mg once daily due to AKI and hyperkalemia, which have resolved now - started Olmesartan 5 mg once daily for now Counseled for compliance with the medications Advised DASH diet and moderate exercise/walking as tolerated

## 2023-08-20 NOTE — Assessment & Plan Note (Signed)
 Likely from COPD, worse recently Needs to continue using home O2 for now Followed by Pulmonology

## 2023-08-25 LAB — BASIC METABOLIC PANEL WITH GFR
BUN/Creatinine Ratio: 18 (ref 10–24)
BUN: 34 mg/dL — ABNORMAL HIGH (ref 8–27)
CO2: 24 mmol/L (ref 20–29)
Calcium: 9.4 mg/dL (ref 8.6–10.2)
Chloride: 103 mmol/L (ref 96–106)
Creatinine, Ser: 1.89 mg/dL — ABNORMAL HIGH (ref 0.76–1.27)
Glucose: 99 mg/dL (ref 70–99)
Potassium: 5.1 mmol/L (ref 3.5–5.2)
Sodium: 142 mmol/L (ref 134–144)
eGFR: 37 mL/min/{1.73_m2} — ABNORMAL LOW (ref >59)

## 2023-08-25 LAB — CBC WITH DIFFERENTIAL/PLATELET
Basophils Absolute: 0 10*3/uL (ref 0.0–0.2)
Basos: 0 %
EOS (ABSOLUTE): 0.2 10*3/uL (ref 0.0–0.4)
Eos: 2 %
Hematocrit: 38.5 % (ref 37.5–51.0)
Hemoglobin: 12.8 g/dL — ABNORMAL LOW (ref 13.0–17.7)
Immature Grans (Abs): 0 10*3/uL (ref 0.0–0.1)
Immature Granulocytes: 0 %
Lymphocytes Absolute: 1.2 10*3/uL (ref 0.7–3.1)
Lymphs: 16 %
MCH: 30.2 pg (ref 26.6–33.0)
MCHC: 33.2 g/dL (ref 31.5–35.7)
MCV: 91 fL (ref 79–97)
Monocytes Absolute: 1.1 10*3/uL — ABNORMAL HIGH (ref 0.1–0.9)
Monocytes: 14 %
Neutrophils Absolute: 5.3 10*3/uL (ref 1.4–7.0)
Neutrophils: 68 %
Platelets: 178 10*3/uL (ref 150–450)
RBC: 4.24 x10E6/uL (ref 4.14–5.80)
RDW: 13.7 % (ref 11.6–15.4)
WBC: 7.8 10*3/uL (ref 3.4–10.8)

## 2023-08-25 LAB — QUANTIFERON-TB GOLD PLUS
QuantiFERON Mitogen Value: >10 [IU]/mL
QuantiFERON Nil Value: 0.03 [IU]/mL
QuantiFERON TB1 Ag Value: 0.05 [IU]/mL
QuantiFERON TB2 Ag Value: 0.04 [IU]/mL
QuantiFERON-TB Gold Plus: NEGATIVE

## 2023-08-25 LAB — ANCA TITERS
Atypical pANCA: <1:20 {titer}
C-ANCA: <1:20 {titer}
P-ANCA: <1:20 {titer}

## 2023-08-25 LAB — D-DIMER, QUANTITATIVE: D-DIMER: 2.43 mg{FEU}/L — ABNORMAL HIGH (ref 0.00–0.49)

## 2023-08-25 LAB — SEDIMENTATION RATE: Sed Rate: 111 mm/h — ABNORMAL HIGH (ref 0–30)

## 2023-08-25 LAB — IGE: IgE (Immunoglobulin E), Serum: 57 [IU]/mL (ref 6–495)

## 2023-08-25 LAB — TSH: TSH: 1.58 u[IU]/mL (ref 0.450–4.500)

## 2023-08-25 LAB — SICKLE CELL SCREEN: Sickle Cell Screen: NEGATIVE

## 2023-08-25 LAB — BRAIN NATRIURETIC PEPTIDE: BNP: 206.2 pg/mL — ABNORMAL HIGH (ref 0.0–100.0)

## 2023-08-28 ENCOUNTER — Telehealth: Payer: Self-pay | Admitting: Internal Medicine

## 2023-08-28 NOTE — Telephone Encounter (Signed)
Spoke with patient regarding results and recommendations. Patient voiced understanding. No further questions or concerns.

## 2023-08-28 NOTE — Telephone Encounter (Signed)
 Labs suggest this is all post inflammatory pattern with high ESR showing it's still ongoing    Since he is diabetic will hold off on rx prednisone unless condition worsens in which case he may need in pt rx and close monitoring of sugars if we do need to use steroids (last resort if getting worse but if stable would hold off for now and return in 3-4 weeks for recheck.

## 2023-09-18 ENCOUNTER — Other Ambulatory Visit: Payer: Self-pay | Admitting: Internal Medicine

## 2023-09-18 DIAGNOSIS — I10 Essential (primary) hypertension: Secondary | ICD-10-CM

## 2023-09-26 ENCOUNTER — Other Ambulatory Visit: Payer: Self-pay | Admitting: Internal Medicine

## 2023-09-26 DIAGNOSIS — I10 Essential (primary) hypertension: Secondary | ICD-10-CM

## 2023-09-27 NOTE — Progress Notes (Unsigned)
 Kyle Patel., male    DOB: 11-09-1945    MRN: [id number]   Brief patient profile:  65  yobm  quit smoking 04/1999 retired Psychologist, occupational  former [facility] pt referred back to pulmonary clinic in [place]  08/17/2023 by [facility] hospitalist  for new hypoxemic resp failure in setting of ? Mulitfocal pna> admit .   [facility] summary 7/18/ 2014   ex smoker, seen for initial pulmonary admission 11/10/11 with Massive PE and LLE DVT  tx w/ coumadin  . Hypercoagulable panel neg.  Echo showed severe pulmonary HTN, RV mildly dilated. Severe Pulm HTN w/ PAP 78  INR 3.9 on 6/28   Hypercoagulable panel was neg  Pulmonary HTN ? Etiology , no known hx prior to admit, autoimmune workup w/ borderline RF, elevated CRP and ESR , neg ANA . ACE nml .  Mother and sister with hx of clots.  No recent surgery , prolonged immobiity, known injury or extended air/car travel.  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  Rpt echo 04/29/12 showed sign improvement w/ Nml EF , PAP pressures return to nml, RV nml size.  Underwent back surgery (lumbar laminectomy w/ decompression ) on 06/03/11 , w/ Lovonex bridge   2015 restrictive changes on pfts/ no obstruction or concavity to f/v loo    Admit date:     07/08/2023  Discharge date: 07/13/23  Discharge Physician: MDALA-GAUSI, Kyle Patel       Discharge Diagnoses:   COPD exacerbation (HCC)   CKD (chronic kidney disease)   Hypertension   Hyperlipidemia   CAD (coronary artery disease)   Acute respiratory failure with hypoxia (HCC)   Type 2 diabetes mellitus with stage 3b chronic kidney disease, without long-term current use of insulin  [facility])       Hospital Course: 78 year old man with PMH of HTN, CAD, CKD, COPD who presented with shortness of breath, malaise and was found to have acute hypoxic respiratory failure due to multifocal pneumonia, COPD exacerbation.      Acute hypoxic respiratory failure due to COPD exacerbation, multifocal pneumonia. No oxygen  use at  baseline. Patient required up to 8 L/min supplemental oxygen . He was weaned down to 4L/min at rest and 6L/min with ambulation.  Home oxygen  was arranged.    Community-acquired pneumonia  CTA PE done on admission revealed bilateral densities - multifocal pneumonia.  Negative for PE.  Emphysematous changes noted. RVP was negative Patient was treated with azithromycin , ceftriaxone  while in hospital and discharged on Augmentin .   COPD exacerbation Patient says his COPD is not being actively managed. He does have an inhaler at home that he was given recently. He was treated with DuoNebs, steroids, antibiotics as above. An ambulatory referral to pulmonology was placed.   CAD Troponin was not elevated on admission. Patient denied chest pain. Continued home aspirin , statin, beta-blockers.   Prediabetes Home Jardiance was resumed at discharge.   Peripheral neuropathy Continued home Lyrica .   Stage III CKD Baseline creatinine appears to be 2-2.3. Patient's renal function remained at baseline.       History of Present Illness  08/17/2023  Pulmonary/ 1st office eval/ Kyle Patel / [place] [facility] timolol / hydralazine  / has avasc necrosis ? SS?  Chief Complaint  Patient presents with   Establish Care    Last seen 2014  Dyspnea:  room to room on 2lpm (not sure)  feels losing ground vs admit  Cough: mucus is white / 2 coughs to clear each am Sleep: flat in bd 1-2 pillows  s orthopnea/ pnd or noct cough  SABA use: breztri  no better  02 use:24/7 usually 2lpm does not titrate  Rec Please remember to go to the lab department   for your tests - we will call you with the results when they are available. Turn your 02 up to 6 lpm walking and 4lpm on your concentrator for now  If condition worsens while waiting on lab results please go to [facility].    09/28/2023  f/u ov/Kyle Patel office/Kyle Patel re: new resp falure multifocal pna  maint  Chief Complaint  Patient presents with    Shortness of Breath  Dyspnea:  mb and back = 100 ft slt uphill to  house from MB  stops halfway back on up to 6lpm cont  but does not titrate higher as rec  Cough: none  Sleeping: bed is flat one pillow s resp cc  SABA use: none  02: 4lpm sleeping / sitting    No obvious day to day or daytime variability or assoc excess/ purulent sputum or mucus plugs or hemoptysis or cp or chest tightness, subjective wheeze or overt sinus or hb symptoms.    Also denies any obvious fluctuation of symptoms with weather or environmental changes or other aggravating or alleviating factors except as outlined above   No unusual exposure hx or h/o childhood pna/ asthma or knowledge of premature birth.  Current Allergies, Complete Past Medical History, Past Surgical History, Family History, and Social History were reviewed in [employer] record.  ROS  The following are not active complaints unless bolded Hoarseness, sore throat, dysphagia, dental problems, itching, sneezing,  nasal congestion or discharge of excess mucus or purulent secretions, ear ache,   fever, chills, sweats, unintended wt loss or wt gain, classically pleuritic or exertional cp,  orthopnea pnd or arm/hand swelling  or leg swelling, presyncope, palpitations, abdominal pain, anorexia, nausea, vomiting, diarrhea  or change in bowel habits or change in bladder habits, change in stools or change in urine, dysuria, hematuria,  rash, arthralgias, visual complaints, headache, numbness, weakness or ataxia or problems with walking or coordination,  change in mood or  memory.        Current Meds  Medication Sig   albuterol  (VENTOLIN  HFA) 108 (90 Base) MCG/ACT inhaler Inhale 2 puffs into the lungs every 6 (six) hours as needed for wheezing or shortness of breath.   amLODipine  (NORVASC ) 10 MG tablet TAKE ONE TABLET BY MOUTH ONCE DAILY.   aspirin  81 MG chewable tablet Chew 81 mg by mouth 2 (two) times daily.   atropine  1 % ophthalmic  solution Place 1 drop into the right eye 3 (three) times daily.   Budeson-Glycopyrrol-Formoterol (BREZTRI  AEROSPHERE) 160-9-4.8 MCG/ACT AERO Inhale 2 puffs into the lungs 2 (two) times daily.   calcitRIOL  (ROCALTROL ) 0.25 MCG capsule Take 0.25 mcg by mouth every Monday, Wednesday, and Friday.   Coenzyme Q10 100 MG TABS Take 1 tablet by mouth daily.   Coenzyme Q10-Vitamin E 100-150 MG-UNIT CAPS Take 1 capsule by mouth at bedtime.   ezetimibe  (ZETIA ) 10 MG tablet TAKE (1) TABLET BY MOUTH ONCE DAILY.   FARXIGA 5 MG TABS tablet Take 5 mg by mouth daily.   hydrALAZINE  (APRESOLINE ) 25 MG tablet TAKE (1) TABLET BY MOUTH TWICE DAILY.   latanoprost  (XALATAN ) 0.005 % ophthalmic solution Place 1 drop into the left eye at bedtime.   metoprolol  succinate (TOPROL -XL) 50 MG 24 hr tablet TAKE (1) TABLET BY MOUTH ONCE DAILY. TAKE WITH OR IMMEDIATELY  FOLLOWING A MEAL.   Misc Natural Products (OSTEO BI-FLEX ADV JOINT SHIELD) TABS Take 1 tablet by mouth daily.   Multiple Vitamins-Minerals (CENTRUM PO) Take 1 tablet by mouth every morning.   nitroGLYCERIN  (NITROSTAT ) 0.4 MG SL tablet PLACE 1 TAB UNDER TONGUE EVERY 5 MIN IF NEEDED FOR CHEST PAIN. MAY USE 3 TIMES.NO RELIEF CALL 911.   olmesartan  (BENICAR ) 5 MG tablet Take 1 tablet (5 mg total) by mouth daily.   pregabalin  (LYRICA ) 200 MG capsule Take 1 capsule (200 mg total) by mouth 2 (two) times daily.   rosuvastatin  (CRESTOR ) 10 MG tablet TAKE (1) TABLET BY MOUTH ONCE DAILY.   sodium bicarbonate  650 MG tablet Take 650 mg by mouth 3 (three) times daily.   timolol  (TIMOPTIC ) 0.5 % ophthalmic solution 1 drop 2 (two) times daily.   tobramycin  (TOBREX ) 0.3 % ophthalmic solution Place 1 drop into both eyes every 6 (six) hours.             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   Hyperlipidemia    Pulmonary embolism (HCC) 10/2011      Objective:     Wt Readings from Last 3 Encounters:  09/28/23 217 lb (98.4 kg)  08/20/23 217 lb 9.6 oz (98.7 kg)  08/17/23 217 lb (98.4 kg)      Vital signs reviewed  09/28/2023  - Note at rest 02 sats  96% on 5lpm   General appearance:    chronically ill eldelry bm nad    HEENT : Oropharynx  clear      Nasal turbinates nl    NECK :  without  apparent JVD/ palpable Nodes/TM    LUNGS: no acc muscle use,  Nl contour chest which is clear to A and P bilaterally without cough on insp or exp maneuvers   CV:  RRR  no s3 or murmur or increase in P2, and no edema   ABD:  soft and nontender   MS:  Gait very slow   ext warm without deformities Or obvious joint restrictions  calf tenderness, cyanosis or clubbing    SKIN: warm and dry without lesions    NEURO:  alert, approp, nl sensorium with  no motor or cerebellar deficits apparent.         I personally reviewed images and agree with radiology impression as follows:   Chest CTa    07/08/23  1. Negative for acute pulmonary embolus or aortic dissection. 2. Emphysema with background of bilateral bronchial wall thickening and mild reticulation. More acute appearing superimposed bilateral ground-glass densities right greater than left, suspect for multifocal pneumonia. Imaging follow-up to resolution is recommended. 3. Mild mediastinal and right hilar adenopathy, slightly increased, and likely reactive. 4. Aortic atherosclerosis.       Assessment

## 2023-09-28 ENCOUNTER — Encounter: Payer: Self-pay | Admitting: Internal Medicine

## 2023-09-28 ENCOUNTER — Ambulatory Visit: Admitting: Internal Medicine

## 2023-09-28 VITALS — BP 145/65 | HR 74 | Ht 69.5 in | Wt 217.0 lb

## 2023-09-28 DIAGNOSIS — R0609 Other forms of dyspnea: Secondary | ICD-10-CM

## 2023-09-28 DIAGNOSIS — J9611 Chronic respiratory failure with hypoxia: Secondary | ICD-10-CM | POA: Diagnosis not present

## 2023-09-28 NOTE — Assessment & Plan Note (Addendum)
 Onset of symptoms about 10 d prior to admit 07/08/23 with dx of ARF/ CAP- NOS  - see chronic resp failure a/p - Collagen vasc dz profile 08/17/2023  neg x for ESR 111 - ACE level 08/17/2023 neg - Quant TB 08/17/2023  neg   Repeating ESR/ BNP today to consider more diuresis vs trial of steroids with HRCT/ venous dopplers next steps  PFts  01/08/14 no significant airflow obst and  no better from breztri   >>>  ok to leave off breztr and just use saba prn  >>> f/u will depend on results - likely will need PF clinic referral in [place], explained to pt and wife.           Each maintenance medication was reviewed in detail including emphasizing most importantly the difference between maintenance and prns and under what circumstances the prns are to be triggered using an action plan format where appropriate.  Total time for H and P, chart review, counseling, reviewing hfa/02/pulse ox  device(s) and generating customized AVS unique to this office visit / same day charting = 44 min

## 2023-09-28 NOTE — Patient Instructions (Signed)
 My office will be contacting you by phone for referral to High resolution chest CT   - if you don't hear back from my office within one week please call us  back or notify us  thru MyChart and we'll address it right away.   Please remember to go to the lab department   for your tests - we will call you with the results when they are available.     Make sure you check your oxygen  saturation  AT  your highest level of activity (not after you stop)   to be sure it stays over 90% and adjust  02 flow upward to maintain this level if needed but remember to turn it back to previous settings when you stop (to conserve your supply)  - walk as slow as you need to keep walking to mailbox if possibl e  If condition worsens go to [place]

## 2023-09-28 NOTE — Assessment & Plan Note (Signed)
 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

## 2023-09-29 LAB — SEDIMENTATION RATE: Sed Rate: 115 mm/h — ABNORMAL HIGH (ref 0–30)

## 2023-09-29 LAB — D-DIMER, QUANTITATIVE: D-DIMER: 1.98 mg{FEU}/L — ABNORMAL HIGH (ref 0.00–0.49)

## 2023-09-29 LAB — BASIC METABOLIC PANEL WITH GFR
BUN/Creatinine Ratio: 22 (ref 10–24)
BUN: 43 mg/dL — ABNORMAL HIGH (ref 8–27)
CO2: 25 mmol/L (ref 20–29)
Calcium: 9.6 mg/dL (ref 8.6–10.2)
Chloride: 103 mmol/L (ref 96–106)
Creatinine, Ser: 1.95 mg/dL — ABNORMAL HIGH (ref 0.76–1.27)
Glucose: 91 mg/dL (ref 70–99)
Potassium: 5 mmol/L (ref 3.5–5.2)
Sodium: 145 mmol/L — ABNORMAL HIGH (ref 134–144)
eGFR: 35 mL/min/{1.73_m2} — ABNORMAL LOW (ref >59)

## 2023-09-29 LAB — BRAIN NATRIURETIC PEPTIDE: BNP: 139 pg/mL — ABNORMAL HIGH (ref 0.0–100.0)

## 2023-09-29 LAB — ANGIOTENSIN CONVERTING ENZYME: Angio Convert Enzyme: 33 U/L (ref 14–82)

## 2023-10-01 ENCOUNTER — Other Ambulatory Visit: Payer: Self-pay | Admitting: Internal Medicine

## 2023-10-01 MED ORDER — PREDNISONE 10 MG PO TABS
ORAL_TABLET | ORAL | 0 refills | Status: DC
Start: 2023-10-01 — End: 2023-10-27

## 2023-10-01 NOTE — Progress Notes (Signed)
Spoke with pt and notified of results per Dr. Wert. Pt verbalized understanding and denied any questions. Rx was sent to pharm  

## 2023-10-13 ENCOUNTER — Ambulatory Visit (HOSPITAL_COMMUNITY)
Admission: RE | Admit: 2023-10-13 | Discharge: 2023-10-13 | Disposition: A | Discharge status: Home / Self Care | Source: Ambulatory Visit | Attending: Internal Medicine | Admitting: Internal Medicine

## 2023-10-13 DIAGNOSIS — R0609 Other forms of dyspnea: Secondary | ICD-10-CM | POA: Insufficient documentation

## 2023-10-20 ENCOUNTER — Ambulatory Visit: Payer: Self-pay | Admitting: Internal Medicine

## 2023-10-26 ENCOUNTER — Ambulatory Visit: Payer: Self-pay | Admitting: Internal Medicine

## 2023-10-26 NOTE — Telephone Encounter (Signed)
Please advise Dr. Melvyn Novas.

## 2023-10-26 NOTE — Telephone Encounter (Signed)
 Let's see if we can see him tomorrow - bring all active meds and let's regroup as no reason based on this scan he should be doing worse and may need to look elsewhere.

## 2023-10-26 NOTE — Telephone Encounter (Signed)
 Copied from CRM [id number]. Topic: Clinical - Lab/Test Results >> Oct 26, 2023 12:16 PM Juliana Ocean wrote: Reason for CRM: pt would like results from CT  [facility] SUMMARY NOTE: Pt called originally asking for CT results from CT on 5/20. Nurse informed pt of CT results per Dr. 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 Advised per Dr. Jacqui Mau note from last OV "Make sure you check your oxygen  saturation  AT  your highest level of activity (not after you stop)   to be sure it stays over 90% and adjust  02 flow upward to maintain this level if needed but remember to turn it back to previous settings when you stop (to conserve your supply)  - walk as slow as you need to keep walking to mailbox if possible. If condition worsens go to [facility] ER." Pt is hard of hearing. Pt verbalized understanding. Nurse asked pt if he was feeling worse, pt reporting more SOB than usual with exertion but maintaining pulse ox above 90%, currently 92% on oxygen  therapy, coughing up clear sputum. Pt confirms he has been using 4L home oxygen  and 5L portable oxygen , confirms no other symptoms. Advised pt be examined in next 4 hours since worsening SOB, no pulm or PCP availability, advised UC in meantime but sending HP message to pulm office for call back to pt with further recommendations and to further discuss CT results. Pt verbalized understanding. Please advise.  E2C2 Pulmonary Triage - Initial Assessment Questions "Chief Complaint (e.g., cough, sob, wheezing, fever, chills, sweat or additional symptoms) *Go to specific symptom protocol after initial questions. SOB more than usual for the past week or so No dizziness, chest pain, or weakness more  than usual No sweating, heart racing, wheezing No new symptoms, coughing fits Coughing up clear phlegm More SOB just when moving around  "Have you used your inhalers/maintenance medication?" No  OXYGEN : "Do you wear supplemental oxygen ?" Yes If yes, "How many liters are you supposed to use?" Home oxygen  4L, on portable 5L  "Do you monitor your oxygen  levels?" Yes If yes, "What is your reading (oxygen  level) today?" With oxygen  on keeping above 90% Sometimes it'll run for 91-98% depends on how long I'm sitting here Right now 92%  Reason for Disposition  [1] Longstanding difficulty breathing (e.g., CHF, COPD, emphysema) AND [2] WORSE than normal  Answer Assessment - Initial Assessment Questions 1. REASON FOR CALL or QUESTION: "What is your reason for calling today?" or "How can I best help you?" or "What question do you have that I can help answer?"     Wanting CT results 2. CALLER: Document the source of call. (e.g., laboratory, patient).     patient  Answer Assessment - Initial Assessment Questions 6. CARDIAC HISTORY: "Do you have any history of heart disease?" (e.g., heart attack, angina, bypass surgery, angioplasty)      significant 7. LUNG HISTORY: "Do you have any history of lung disease?"  (e.g., pulmonary embolus, asthma, emphysema)     significant  Protocols used: PCP Call - No Triage-A-AH, Breathing Difficulty-A-AH

## 2023-10-26 NOTE — Telephone Encounter (Signed)
 I called the pt and there was no answer- LMTCB. ?

## 2023-10-27 ENCOUNTER — Ambulatory Visit: Admitting: Internal Medicine

## 2023-10-27 ENCOUNTER — Encounter: Payer: Self-pay | Admitting: Internal Medicine

## 2023-10-27 VITALS — BP 136/63 | HR 73 | Ht 69.0 in | Wt 221.1 lb

## 2023-10-27 DIAGNOSIS — E1122 Type 2 diabetes mellitus with diabetic chronic kidney disease: Secondary | ICD-10-CM

## 2023-10-27 DIAGNOSIS — N1832 Chronic kidney disease, stage 3b: Secondary | ICD-10-CM | POA: Diagnosis not present

## 2023-10-27 DIAGNOSIS — R0609 Other forms of dyspnea: Secondary | ICD-10-CM

## 2023-10-27 DIAGNOSIS — I2510 Atherosclerotic heart disease of native coronary artery without angina pectoris: Secondary | ICD-10-CM

## 2023-10-27 DIAGNOSIS — J9611 Chronic respiratory failure with hypoxia: Secondary | ICD-10-CM | POA: Diagnosis not present

## 2023-10-27 DIAGNOSIS — I129 Hypertensive chronic kidney disease with stage 1 through stage 4 chronic kidney disease, or unspecified chronic kidney disease: Secondary | ICD-10-CM | POA: Diagnosis not present

## 2023-10-27 DIAGNOSIS — E785 Hyperlipidemia, unspecified: Secondary | ICD-10-CM

## 2023-10-27 DIAGNOSIS — Z87891 Personal history of nicotine dependence: Secondary | ICD-10-CM | POA: Diagnosis not present

## 2023-10-27 NOTE — Patient Instructions (Addendum)
 My office will be contacting you by phone for referral to Echocardiogram with bubble study - if you don't hear back from my office within one week please call us  back or notify us  thru MyChart and we'll address it right away.   Make sure you check your oxygen  saturation  AT  your highest level of activity (not after you stop)   to be sure it stays over 90% and adjust  02 flow upward to maintain this level if needed but remember to turn it back to previous settings when you stop (to conserve your supply).    Please schedule a follow up visit in 3 months but call sooner if needed

## 2023-10-27 NOTE — Progress Notes (Unsigned)
 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 Mulitfocal pna> admit .   [facility] summary 7/18/ 2014   ex smoker, seen for initial pulmonary admission 11/10/11 with Massive PE and LLE DVT  tx w/ coumadin  . Hypercoagulable panel neg.  Echo showed severe pulmonary HTN, RV mildly dilated. Severe Pulm HTN w/ PAP 78  INR 3.9 on 6/28   Hypercoagulable panel was neg  Pulmonary HTN ? Etiology , no known hx prior to admit, autoimmune workup w/ borderline RF, elevated CRP and ESR , neg ANA . ACE nml .  Mother and sister with hx of clots.  No recent surgery , prolonged immobiity, known injury or extended air/car travel.  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  Rpt echo 04/29/12 showed sign improvement w/ Nml EF , PAP pressures return to nml, RV nml size.  Underwent back surgery (lumbar laminectomy w/ decompression ) on 06/03/11 , w/ Lovonex bridge   2015 restrictive changes on pfts/ no obstruction or concavity to f/v loo    Admit date:     07/08/2023  Discharge date: 07/13/23  Discharge Physician: MDALA-GAUSI, Zygmunt Hives AGATHA       Discharge Diagnoses:   COPD exacerbation (HCC)   CKD (chronic kidney disease)   Hypertension   Hyperlipidemia   CAD (coronary artery disease)   Acute respiratory failure with hypoxia (HCC)   Type 2 diabetes mellitus with stage 3b chronic kidney disease, without long-term current use of insulin  [facility])       Hospital Course: 78 year old man with PMH of HTN, CAD, CKD, COPD who presented with shortness of breath, malaise and was found to have acute hypoxic respiratory failure due to multifocal pneumonia, COPD exacerbation.      Acute hypoxic respiratory failure due to COPD exacerbation, multifocal pneumonia. No oxygen  use at  baseline. Patient required up to 8 L/min supplemental oxygen . He was weaned down to 4L/min at rest and 6L/min with ambulation.  Home oxygen  was arranged.    Community-acquired pneumonia  CTA PE done on admission revealed bilateral densities - multifocal pneumonia.  Negative for PE.  Emphysematous changes noted. RVP was negative Patient was treated with azithromycin , ceftriaxone  while in hospital and discharged on Augmentin .   COPD exacerbation Patient says his COPD is not being actively managed. He does have an inhaler at home that he was given recently. He was treated with DuoNebs, steroids, antibiotics as above. An ambulatory referral to pulmonology was placed.   CAD Troponin was not elevated on admission. Patient denied chest pain. Continued home aspirin , statin, beta-blockers.   Prediabetes Home Jardiance was resumed at discharge.   Peripheral neuropathy Continued home Lyrica .   Stage III CKD Baseline creatinine appears to be 2-2.3. Patient's renal function remained at baseline.       History of Present Illness  08/17/2023  Pulmonary/ 1st office eval/ Dewight Catino / [place] [facility] timolol / hydralazine  / has avasc necrosis ? SS?  Chief Complaint  Patient presents with   Establish Care    Last seen 2014  Dyspnea:  room to room on 2lpm (not sure)  feels losing ground vs admit  Cough: mucus is white / 2 coughs to clear each am Sleep: flat in bd 1-2 pillows  s orthopnea/ pnd or noct cough  SABA use: breztri  no better  02 use:24/7 usually 2lpm does not titrate  Rec Please remember to go to the lab department   for your tests - we will call you with the results when they are available. Turn your 02 up to 6 lpm walking and 4lpm on your concentrator for now  If condition worsens while waiting on lab results please go to [facility].    09/28/2023  f/u ov/[place] office/Verna [facility]rocher re: new resp falure multifocal pna  maint  Chief Complaint  Patient presents with    Shortness of Breath  Dyspnea:  mb and back = 100 ft slt uphill to  house from MB  stops halfway back on up to 6lpm cont  but does not titrate higher as rec  Cough: none  Sleeping: bed is flat one pillow s resp cc  SABA use: none  02: 4lpm sleeping / sitting  Rec Make sure you check your oxygen  saturation  AT  your highest level of activity (not after you stop)   to be sure it stays over 90  10/01/23 trial of low dose prednisone  > no better   HRCT 10/13/23 c/w post ALI (from CAP) PF and not c/w UIP     10/27/2023  f/u ov/[place] office/Maridel Pixler re: PF s/p ALI  maint on 02   Dyspnea:  no better but not titrating 02 up with activity  Cough: none  Sleeping: flat bed one pillow s    resp cc  SABA use: none  02: 4lpm concentrator / 5lpm walking cont      No obvious day to day or daytime variability or assoc excess/ purulent sputum or mucus plugs or hemoptysis or cp or chest tightness, subjective wheeze or overt sinus or hb symptoms.    Also denies any obvious fluctuation of symptoms with weather or environmental changes or other aggravating or alleviating factors except as outlined above   No unusual exposure hx or h/o childhood pna/ asthma or knowledge of premature birth.  Current Allergies, Complete Past Medical History, Past Surgical History, Family History, and Social History were reviewed in [employer] record.  ROS  The following are not active complaints unless bolded Hoarseness, sore throat, dysphagia, dental problems, itching, sneezing,  nasal congestion or discharge of excess mucus or purulent secretions, ear ache,   fever, chills, sweats, unintended wt loss or wt gain, classically pleuritic or exertional cp,  orthopnea pnd or arm/hand swelling  or leg swelling, presyncope, palpitations, abdominal pain, anorexia, nausea, vomiting, diarrhea  or change in bowel habits or change in bladder habits, change in stools or change in urine, dysuria, hematuria,  rash,  arthralgias, visual complaints, headache, numbness, weakness or ataxia or problems with walking or coordination,  change in mood or  memory.        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 Mulitfocal pna> admit .   [facility] summary 7/18/ 2014   ex smoker, seen for initial pulmonary admission 11/10/11 with Massive PE and LLE DVT  tx w/ coumadin  . Hypercoagulable panel neg.  Echo showed severe pulmonary HTN, RV mildly dilated. Severe Pulm HTN w/ PAP 78  INR 3.9 on 6/28   Hypercoagulable panel was neg  Pulmonary HTN ? Etiology , no known hx prior to admit, autoimmune workup w/ borderline RF, elevated CRP and ESR , neg ANA . ACE nml .  Mother and sister with hx of clots.  No recent surgery , prolonged immobiity, known injury or extended air/car travel.  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  Rpt echo 04/29/12 showed sign improvement w/ Nml EF , PAP pressures return to nml, RV nml size.  Underwent back surgery (lumbar laminectomy w/ decompression ) on 06/03/11 , w/ Lovonex bridge   2015 restrictive changes on pfts/ no obstruction or concavity to f/v loo    Admit date:     07/08/2023  Discharge date: 07/13/23  Discharge Physician: MDALA-GAUSI, Zygmunt Hives AGATHA       Discharge Diagnoses:   COPD exacerbation (HCC)   CKD (chronic kidney disease)   Hypertension   Hyperlipidemia   CAD (coronary artery disease)   Acute respiratory failure with hypoxia (HCC)   Type 2 diabetes mellitus with stage 3b chronic kidney disease, without long-term current use of insulin  [facility])       Hospital Course: 78 year old man with PMH of HTN, CAD, CKD, COPD who presented with shortness of breath, malaise and was found to have acute hypoxic respiratory failure due to multifocal pneumonia, COPD exacerbation.      Acute hypoxic respiratory failure due to COPD exacerbation, multifocal pneumonia. No oxygen  use at  baseline. Patient required up to 8 L/min supplemental oxygen . He was weaned down to 4L/min at rest and 6L/min with ambulation.  Home oxygen  was arranged.    Community-acquired pneumonia  CTA PE done on admission revealed bilateral densities - multifocal pneumonia.  Negative for PE.  Emphysematous changes noted. RVP was negative Patient was treated with azithromycin , ceftriaxone  while in hospital and discharged on Augmentin .   COPD exacerbation Patient says his COPD is not being actively managed. He does have an inhaler at home that he was given recently. He was treated with DuoNebs, steroids, antibiotics as above. An ambulatory referral to pulmonology was placed.   CAD Troponin was not elevated on admission. Patient denied chest pain. Continued home aspirin , statin, beta-blockers.   Prediabetes Home Jardiance was resumed at discharge.   Peripheral neuropathy Continued home Lyrica .   Stage III CKD Baseline creatinine appears to be 2-2.3. Patient's renal function remained at baseline.       History of Present Illness  08/17/2023  Pulmonary/ 1st office eval/ Dewight Catino / [place] [facility] timolol / hydralazine  / has avasc necrosis ? SS?  Chief Complaint  Patient presents with   Establish Care    Last seen 2014  Dyspnea:  room to room on 2lpm (not sure)  feels losing ground vs admit  Cough: mucus is white / 2 coughs to clear each am Sleep: flat in bd 1-2 pillows  s orthopnea/ pnd or noct cough  SABA use: breztri  no better  02 use:24/7 usually 2lpm does not titrate  Rec Please remember to go to the lab department   for your tests - we will call you with the results when they are available. Turn your 02 up to 6 lpm walking and 4lpm on your concentrator for now  If condition worsens while waiting on lab results please go to [facility].    09/28/2023  f/u ov/[place] office/Verna Desrocher re: new resp falure multifocal pna  maint  Chief Complaint  Patient presents with    Shortness of Breath  Dyspnea:  mb and back = 100 ft slt uphill to  house from MB  stops halfway back on up to 6lpm cont  but does not titrate higher as rec  Cough: none  Sleeping: bed is flat one pillow s resp cc  SABA use: none  02: 4lpm sleeping / sitting  Rec Make sure you check your oxygen  saturation  AT  your highest level of activity (not after you stop)   to be sure it stays over 90  10/01/23 trial of low dose prednisone  > no better   HRCT 10/13/23 c/w post ALI (from CAP) PF and not c/w UIP     10/27/2023  f/u ov/[place] office/Maridel Pixler re: PF s/p ALI  maint on 02   Dyspnea:  no better but not titrating 02 up with activity  Cough: none  Sleeping: flat bed one pillow s    resp cc  SABA use: none  02: 4lpm concentrator / 5lpm walking cont      No obvious day to day or daytime variability or assoc excess/ purulent sputum or mucus plugs or hemoptysis or cp or chest tightness, subjective wheeze or overt sinus or hb symptoms.    Also denies any obvious fluctuation of symptoms with weather or environmental changes or other aggravating or alleviating factors except as outlined above   No unusual exposure hx or h/o childhood pna/ asthma or knowledge of premature birth.  Current Allergies, Complete Past Medical History, Past Surgical History, Family History, and Social History were reviewed in [employer] record.  ROS  The following are not active complaints unless bolded Hoarseness, sore throat, dysphagia, dental problems, itching, sneezing,  nasal congestion or discharge of excess mucus or purulent secretions, ear ache,   fever, chills, sweats, unintended wt loss or wt gain, classically pleuritic or exertional cp,  orthopnea pnd or arm/hand swelling  or leg swelling, presyncope, palpitations, abdominal pain, anorexia, nausea, vomiting, diarrhea  or change in bowel habits or change in bladder habits, change in stools or change in urine, dysuria, hematuria,  rash,  arthralgias, visual complaints, headache, numbness, weakness or ataxia or problems with walking or coordination,  change in mood or  memory.        Current Meds  Medication Sig   albuterol  (VENTOLIN  HFA) 108 (90 Base) MCG/ACT inhaler Inhale 2 puffs into the lungs every 6 (six) hours as needed for wheezing or shortness of breath.   amLODipine  (NORVASC ) 10 MG tablet TAKE ONE TABLET BY MOUTH ONCE DAILY.   aspirin  81 MG chewable tablet Chew 81 mg by mouth 2 (two) times daily.   atropine  1 % ophthalmic solution Place 1 drop into the right eye 3 (three) times daily.   calcitRIOL  (ROCALTROL ) 0.25 MCG capsule Take 0.25 mcg by mouth every Monday, Wednesday, and Friday.   Coenzyme Q10 100 MG TABS Take 1 tablet by mouth  daily.   Coenzyme Q10-Vitamin E 100-150 MG-UNIT CAPS Take 1 capsule by mouth at bedtime.   ezetimibe  (ZETIA ) 10 MG tablet TAKE (1) TABLET BY MOUTH ONCE DAILY.   FARXIGA 5 MG TABS tablet Take 5 mg by mouth daily.   furosemide  (LASIX ) 20 MG tablet TAKE 1 TABLET BY MOUTH ONCE DAILY.   hydrALAZINE  (APRESOLINE ) 25 MG tablet TAKE (1) TABLET BY MOUTH TWICE DAILY.   latanoprost  (XALATAN ) 0.005 % ophthalmic solution Place 1 drop into the left eye at bedtime.   metoprolol  succinate (TOPROL -XL) 50 MG 24 hr tablet TAKE (1) TABLET BY MOUTH ONCE DAILY. TAKE WITH OR IMMEDIATELY FOLLOWING A MEAL.   Misc Natural Products (OSTEO BI-FLEX ADV JOINT SHIELD) TABS Take 1 tablet by mouth daily.   Multiple Vitamins-Minerals (CENTRUM PO) Take 1 tablet by mouth every morning.   olmesartan  (BENICAR ) 5 MG tablet Take 1 tablet (5 mg total) by mouth daily.   pregabalin  (LYRICA ) 200 MG capsule Take 1 capsule (200 mg total) by mouth 2 (two) times daily.   rosuvastatin  (CRESTOR ) 10 MG tablet TAKE (1) TABLET BY MOUTH ONCE DAILY.   sodium bicarbonate  650 MG tablet Take 650 mg by mouth 3 (three) times daily.   timolol  (TIMOPTIC ) 0.5 % ophthalmic solution 1 drop 2 (two) times daily.   tobramycin  (TOBREX ) 0.3 % ophthalmic  solution Place 1 drop into both eyes every 6 (six) hours.              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   Hyperlipidemia    Pulmonary embolism (HCC) 10/2011      Objective:    Wts  10/27/2023         221   09/28/23 217 lb (98.4 kg)  08/20/23 217 lb 9.6 oz (98.7 kg)  08/17/23 217 lb (98.4 kg)    Vital signs reviewed  10/27/2023  - Note at rest 02 sats  94% on 5 lpm cont   General appearance:    chornically ill stoic amb bm nad    HEENT : Oropharynx  clear      Nasal turbinates nl    NECK :  without  apparent JVD/ palpable Nodes/TM    LUNGS: no acc muscle use,  Nl contour chest with distant insp crackles both bases   CV:  RRR  no s3 or murmur or increase in P2, and no edema   ABD:  soft and nontender   MS:  Gait slow/ deliberate  ext warm without deformities Or obvious joint restrictions  calf tenderness, cyanosis or clubbing    SKIN: warm and dry without lesions    NEURO:  alert, approp, nl sensorium with  no motor or cerebellar deficits apparent.         Lab Results  Component Value Date   ESRSEDRATE 115 (H) 09/28/2023   ESRSEDRATE 111 (H) 08/17/2023   ESRSEDRATE 111 (H) 11/12/2011       Assessment

## 2023-10-27 NOTE — Telephone Encounter (Signed)
 Patient scheduled for today 10/27/2023

## 2023-10-27 NOTE — Telephone Encounter (Signed)
 Tried calling patient, no answer and LM.

## 2023-10-28 NOTE — Assessment & Plan Note (Addendum)
 Onset of symptoms about 10 d prior to admit 07/08/23 with dx of ARF/ CAP- NOS  - see chronic resp failure a/p - Collagen vasc dz profile 08/17/2023  neg x for ESR 111 - ACE level 08/17/2023 neg - Quant TB 08/17/2023  neg  - HRCT 10/13/23 c/w post ALI (from CAP) PF and not c/w UIP - 10/01/23 trial of low dose prednisone  > no better   Rec:   Echocardiogram /bubble study 10/27/2023 >>>

## 2023-10-28 NOTE — Assessment & Plan Note (Signed)
 S/p PE  2014 s residual PH  - new doe 06/2023 > see admit 07/08/23  - 08/17/2023   Walked on 4lpm  x   approx 75  ft  @ slow pace, stopped due to desats with lowest 02 sats 82% then increased to 6lpm to get sats to 84% and 8 lpm  to get to 88% and complete another 75 ft  - 09/28/2023   Walked on 8lpm cont   x  2  lap(s) =  approx 300  ft  @ slow pace, stopped due to desats to 87%  with lowest 02 sats 87%    - HRCT 10/13/23 c/w post ALI (from CAP) PF and not c/w UIP  - 10/27/2023   Walked on 4lpm   x  1  lap(s) =  approx 150  ft  @ slow  pace, stopped due to sob with desats resolved on 6lpm  with lowest 02 sats 91% but still stopped due to doe.  Will check echo for shunt/ encourge sub max ex on 6lpm both othwise 4lpm should be adequeate  Each maintenance medication was reviewed in detail including emphasizing most importantly the difference between maintenance and prns and under what circumstances the prns are to be triggered using an action plan format where appropriate.  Total time for H and P, chart review, counseling,  directly observing portions of ambulatory 02 saturation study/ and generating customized AVS unique to this office visit / same day charting = 24 min

## 2023-10-28 NOTE — Progress Notes (Signed)
 Results discussed with the pt yesterday at visit with Dr. Waymond Hailey.

## 2023-11-11 ENCOUNTER — Telehealth: Payer: Self-pay | Admitting: *Deleted

## 2023-11-11 ENCOUNTER — Ambulatory Visit (HOSPITAL_COMMUNITY)
Admission: RE | Admit: 2023-11-11 | Discharge: 2023-11-11 | Disposition: A | Discharge status: Home / Self Care | Source: Ambulatory Visit | Attending: Internal Medicine | Admitting: Internal Medicine

## 2023-11-11 DIAGNOSIS — R0609 Other forms of dyspnea: Secondary | ICD-10-CM | POA: Insufficient documentation

## 2023-11-11 LAB — ECHOCARDIOGRAM COMPLETE BUBBLE STUDY
AR max vel: 2.16 cm2
AV Area VTI: 1.98 cm2
AV Area mean vel: 1.65 cm2
AV Mean grad: 4 mmHg
AV Peak grad: 7.6 mmHg
Ao pk vel: 1.38 m/s
Area-P 1/2: 3.83 cm2
Calc EF: 64.7 %
S' Lateral: 3.1 cm
Single Plane A2C EF: 68.7 %
Single Plane A4C EF: 60.6 %

## 2023-11-11 NOTE — Telephone Encounter (Signed)
 Prescription refill request for Eliquis received. Indication: LV mural thrombus  Last office visit: 01/23/23 Londa Rival)  Scr: 1.95 (09/28/23)  Age: 78 Weight: 100.3kg  Eliquis 5mg  BID is appropriate dose.

## 2023-11-11 NOTE — Telephone Encounter (Signed)
 The echocardiogram on this patient was ordered by Dr. Waymond Hailey. Dr. Maximo Spar reached out to both of us  and I reviewed the results. I think the best thing to do would be to take him off aspirin  and start Eliquis 5 mg twice daily as tolerated given LV mural thrombus. Please reach out to the patient to let him know that he should be hearing the full report from Dr. Waymond Hailey since he ordered the study, but that I was able to review it as well with the current plan. Make sure that he has follow-up as well.

## 2023-11-11 NOTE — Telephone Encounter (Signed)
 Pt age 78, weight 100.3kg, Creat 1.95 on 09/28/23, based on specified criteria appropriate dosage of Eliquis would be 5mg  BID.

## 2023-11-12 MED ORDER — APIXABAN 5 MG PO TABS: 5.0000 mg | ORAL_TABLET | Freq: Two times a day (BID) | ORAL | 6 refills | Status: DC

## 2023-11-12 NOTE — Telephone Encounter (Signed)
 Patient informed and verbalized understanding of plan. Aware to come by the office to pick up eliquis 30 day free voucher.

## 2023-11-27 ENCOUNTER — Other Ambulatory Visit: Payer: Self-pay | Admitting: Cardiology

## 2023-12-03 ENCOUNTER — Encounter: Admitting: Internal Medicine

## 2023-12-04 ENCOUNTER — Ambulatory Visit: Admitting: Cardiology

## 2023-12-10 ENCOUNTER — Ambulatory Visit: Payer: Self-pay

## 2023-12-10 NOTE — Telephone Encounter (Signed)
 FYI Only or Action Required?: FYI only for provider.  Patient was last seen in primary care on 08/20/2023 by Tobie Suzzane POUR, MD.  Called Nurse Triage reporting No chief complaint on file..  Symptoms began yesterday.  Interventions attempted: Nothing.  Symptoms are: unchanged.  Triage Disposition: see PCP within 24 hours  Patient/caregiver understands and will follow disposition?: Yes           Copied from CRM [id number]. Topic: Clinical - Red Word Triage >> Dec 10, 2023  8:28 AM Willma R wrote: Red Word that prompted transfer to Nurse Triage: Patient states he hasn't been feeling well and has been keeping track of his Blood Pressure and it has been low for a week. Last reading was last night at 106/43, has also had low energy and blurry vision. Reason for Disposition  [1] Systolic BP 90-110 AND [2] taking blood pressure medications AND [3] feeling weak or lightheaded  Answer Assessment - Initial Assessment Questions 1. BLOOD PRESSURE: What is your blood pressure? Did you take at least two measurements 5 minutes apart?     106/43 last night 2. ONSET: When did you take your blood pressure?     Last night 3. HOW: How did you take your blood pressure? (e.g., visiting nurse, automatic home BP monitor)     Automatic cuff 4. HISTORY: Do you have a history of low blood pressure? What is your blood pressure normally?     No, hx HTN 5. MEDICINES: Are you taking any medicines for blood pressure? If Yes, ask: Have they been changed recently?     denies 6. PULSE RATE: Do you know what your pulse rate is?      denies 7. OTHER SYMPTOMS: Have you been sick recently? Have you had a recent injury?     Lethargic, blurred vision, low energy,  8. PREGNANCY: Is there any chance you are pregnant? When was your last menstrual period?     na  Protocols used: Blood Pressure - Low-A-AH

## 2023-12-10 NOTE — Telephone Encounter (Signed)
 Patient scheduled.

## 2023-12-11 ENCOUNTER — Ambulatory Visit

## 2023-12-11 ENCOUNTER — Other Ambulatory Visit: Payer: Self-pay | Admitting: Cardiology

## 2023-12-11 VITALS — BP 110/50 | HR 94 | Ht 69.0 in | Wt 224.0 lb

## 2023-12-11 DIAGNOSIS — I10 Essential (primary) hypertension: Secondary | ICD-10-CM

## 2023-12-11 NOTE — Patient Instructions (Signed)
 Recommend decreasing Lasix  back down to one tablet (20 mg) two times a day.  Recommend reaching out to Dr. Juanito office if still remaining too low (less than 90/50).

## 2023-12-11 NOTE — Progress Notes (Signed)
 Established Patient Office Visit  Subjective   Patient ID: Kyle Patel., male    DOB: August 30, 1945  Age: 78 y.o. MRN: [id number]  Chief Complaint  Patient presents with   Medical Management of Chronic Issues    Pt here states low blood pressure     HPI  Heith Haigler. is a 78 y.o. male with past medical history of CAD, HTN, CKD, peripheral neuropathy and HLD who presents for f/u of his chronic medical conditions.   Chronic respiratory failure: He was evaluated by Dr. Darlean for recent worsening of his breathing.  He was admitted at [facility] in the last month for COPD exacerbation and multifocal pneumonia.  He has been oxygen  dependent since then, at 4 lpm at rest and 6 lpm upon ambulation currently.  Denies any fever or chills.  He has been placed on Breztri  for COPD.   HTN and CAD: He has had stent placed for history of NSTEMI in 2018.  He is on aspirin  and statin currently. BP is elevated today. Takes medications regularly. Patient denies headache, dizziness, chest pain, or palpitations.   CKD stage IIIb: Followed by Dr. Rachele.  His last BMP showed GFR of 34 in 03/24, which was stable compared to his baseline. He has seen Dr. Rachele in 12/24.  He currently denies any dysuria, hematuria, urinary hesitance or resistance.   Type II DM: He is currently on Farxiga 5 mg QD for CKD.  His HbA1c was 6.4 in 10/24.  He denies polyuria or polyphagia currently.  Patient Active Problem List   Diagnosis Date Noted   Chronic respiratory failure with hypoxia (HCC) 08/17/2023   DOE (dyspnea on exertion) 08/17/2023   Multifocal pneumonia 07/23/2023   COPD exacerbation (HCC) 07/08/2023   Onychomycosis 03/20/2023   Acute on chronic bronchitis (HCC) 09/11/2022   Type 2 diabetes mellitus with stage 3b chronic kidney disease, without long-term current use of insulin  (HCC) 05/29/2022   Hospital discharge follow-up 05/29/2022   Hyperkalemia 05/19/2022   Acute respiratory failure with  hypoxia (HCC) 05/18/2022   S/P total knee arthroplasty, right 05/18/2022   Primary localized osteoarthritis of right knee 05/16/2022   Encounter for general adult medical examination with abnormal findings 05/12/2022   Preop examination 02/21/2022   Leg swelling 02/21/2022   CAD (coronary artery disease) 09/02/2021   Hyperlipidemia 04/27/2017   NSTEMI (non-ST elevated myocardial infarction) (HCC) 04/24/2017   History of colonic polyps    Diverticulosis of colon without hemorrhage    Family history of colon cancer 06/11/2015   Chronic foot pain 03/08/2015   Idiopathic peripheral neuropathy 05/31/2014   Lumbar radiculopathy 11/21/2013   Pain in joint, shoulder region 07/21/2012   Nocturnal muscle cramps 01/19/2012   Hypertension 11/10/2011   CKD (chronic kidney disease) stage 3, GFR 30-59 ml/min (HCC) 11/10/2011   Osteoarthritis of knee 09/16/2011      ROS    Objective:     BP (!) 110/50 (BP Location: Left Arm, Patient Position: Sitting, Cuff Size: Normal)   Pulse 94   Ht 5' 9 (1.753 m)   Wt 224 lb (101.6 kg)   SpO2 (!) 79%   BMI 33.08 kg/m  BP Readings from Last 3 Encounters:  12/11/23 (!) 110/50  10/27/23 136/63  09/28/23 (!) 145/65   Wt Readings from Last 3 Encounters:  12/11/23 224 lb (101.6 kg)  10/27/23 221 lb 1.6 oz (100.3 kg)  09/28/23 217 lb (98.4 kg)      Physical Exam  Vitals and nursing note reviewed. Exam conducted with a chaperone present (pt's wife is with him).  Constitutional:      Appearance: Normal appearance.  HENT:     Head: Normocephalic.  Eyes:     Extraocular Movements: Extraocular movements intact.     Pupils: Pupils are equal, round, and reactive to light.  Cardiovascular:     Rate and Rhythm: Normal rate and regular rhythm.  Pulmonary:     Effort: Pulmonary effort is normal.     Breath sounds: Normal breath sounds.  Musculoskeletal:     Cervical back: Normal range of motion and neck supple.  Neurological:     Mental Status: He  is alert and oriented to person, place, and time.  Psychiatric:        Mood and Affect: Mood normal.        Thought Content: Thought content normal.      No results found for any visits on 12/11/23.  Last CBC Lab Results  Component Value Date   WBC 7.8 08/17/2023   HGB 12.8 (L) 08/17/2023   HCT 38.5 08/17/2023   MCV 91 08/17/2023   MCH 30.2 08/17/2023   RDW 13.7 08/17/2023   PLT 178 08/17/2023   Last metabolic panel Lab Results  Component Value Date   GLUCOSE 91 09/28/2023   NA 145 (H) 09/28/2023   K 5.0 09/28/2023   CL 103 09/28/2023   CO2 25 09/28/2023   BUN 43 (H) 09/28/2023   CREATININE 1.95 (H) 09/28/2023   EGFR 35 (L) 09/28/2023   CALCIUM  9.6 09/28/2023   PHOS 2.9 11/15/2011   PROT 8.0 07/08/2023   ALBUMIN 3.6 07/08/2023   LABGLOB 3.6 03/20/2023   BILITOT 0.9 07/08/2023   ALKPHOS 55 07/08/2023   AST 29 07/08/2023   ALT 21 07/08/2023   ANIONGAP 6 07/13/2023   Last lipids Lab Results  Component Value Date   CHOL 176 03/20/2023   HDL 51 03/20/2023   LDLCALC 102 (H) 03/20/2023   TRIG 132 03/20/2023   CHOLHDL 3.5 03/20/2023   Last hemoglobin A1c Lab Results  Component Value Date   HGBA1C 6.4 (H) 03/20/2023   Last thyroid  functions Lab Results  Component Value Date   TSH 1.580 08/17/2023      The ASCVD Risk score (Arnett DK, et al., 2019) failed to calculate for the following reasons:   Risk score cannot be calculated because patient has a medical history suggesting prior/existing ASCVD    Assessment & Plan:   Problem List Items Addressed This Visit       Cardiovascular and Mediastinum   Hypertension - Primary   Recommend decreasing Lasix  back down to one tablet (20 mg) two times a day.  Recommend reaching out to Dr. Juanito office if still remaining too low (less than 90/50).        No follow-ups on file.    Leita Longs, FNP

## 2023-12-18 NOTE — Assessment & Plan Note (Signed)
 Recommend decreasing Lasix  back down to one tablet (20 mg) two times a day.  Recommend reaching out to Dr. Juanito office if still remaining too low (less than 90/50).

## 2024-01-18 ENCOUNTER — Encounter: Payer: Self-pay | Admitting: Internal Medicine

## 2024-01-18 ENCOUNTER — Ambulatory Visit (INDEPENDENT_AMBULATORY_CARE_PROVIDER_SITE_OTHER): Payer: Self-pay | Admitting: Internal Medicine

## 2024-01-18 VITALS — BP 127/60 | HR 109 | Ht 69.0 in | Wt 226.0 lb

## 2024-01-18 DIAGNOSIS — E1122 Type 2 diabetes mellitus with diabetic chronic kidney disease: Secondary | ICD-10-CM

## 2024-01-18 DIAGNOSIS — J449 Chronic obstructive pulmonary disease, unspecified: Secondary | ICD-10-CM | POA: Diagnosis not present

## 2024-01-18 DIAGNOSIS — Z7984 Long term (current) use of oral hypoglycemic drugs: Secondary | ICD-10-CM

## 2024-01-18 DIAGNOSIS — I10 Essential (primary) hypertension: Secondary | ICD-10-CM

## 2024-01-18 DIAGNOSIS — Z0001 Encounter for general adult medical examination with abnormal findings: Secondary | ICD-10-CM

## 2024-01-18 DIAGNOSIS — N1832 Chronic kidney disease, stage 3b: Secondary | ICD-10-CM

## 2024-01-18 DIAGNOSIS — I25110 Atherosclerotic heart disease of native coronary artery with unstable angina pectoris: Secondary | ICD-10-CM

## 2024-01-18 DIAGNOSIS — J9611 Chronic respiratory failure with hypoxia: Secondary | ICD-10-CM

## 2024-01-18 NOTE — Assessment & Plan Note (Signed)
 BP Readings from Last 1 Encounters:  01/18/24 127/60   Well-controlled with amlodipine  10 mg once daily,  Olmesartan  5 mg once daily, metoprolol  50 mg QD and hydralazine  25 mg BID Counseled for compliance with the medications Advised DASH diet and moderate exercise/walking as tolerated

## 2024-01-18 NOTE — Progress Notes (Signed)
 Established Patient Office Visit  Subjective:  Patient ID: Kyle Patel., male    DOB: 14-Sep-1945  Age: 78 y.o. MRN: [id number]  CC:  Chief Complaint  Patient presents with   Annual Exam    HPI Kyle Patel. is a 78 y.o. male with past medical history of CAD, HTN, CKD, peripheral neuropathy and HLD who presents for f/u of his chronic medical conditions.  Chronic respiratory failure: He was evaluated by Dr. Darlean for exertional dyspnea.  He was admitted at [facility] in 02/25 for COPD exacerbation and multifocal pneumonia.  He has been oxygen  dependent since then, at 4 lpm at rest and 6 lpm upon ambulation currently.  Denies any fever or chills.  He was placed on Breztri  for COPD, but is currently using only Albuterol  PRN after Pulmonology evaluation.  HTN and CAD: He has had stent placed for history of NSTEMI in 2018.  He is on aspirin  and statin currently. BP is wnl today. Takes medications regularly. Patient denies headache, dizziness, chest pain, or palpitations.  CKD stage IIIb: Followed by Dr. Rachele.  His last BMP showed GFR of 35 in 05/25, which was stable compared to his baseline. He has seen Dr. Rachele in 07/25.  He currently denies any dysuria, hematuria, urinary hesitance or resistance. His dose of Lasix  was recently reduced to 20 mg BID due to hypotensive spells.  Type II DM: He is currently on Farxiga 5 mg QD for CKD.  His HbA1c was 6.5 today.  He denies polyuria or polyphagia currently.  He has history of peripheral neuropathy, for which he takes Lyrica  200 mg twice daily.  He has chronic burning pain in his feet and also complains of intermittent weakness of the LE, but have improved since increasing dose of Lyrica .  Denies any recent fall.    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/2014   Hyperlipidemia    Pulmonary embolism (HCC) 10/2011    Past Surgical History:  Procedure Laterality Date   ABDOMINAL SURGERY     colon polyp removed 2007- sepsis, coma for 4 months per pt   BIOPSY  12/24/2020   Procedure: BIOPSY;  Surgeon: Cindie Carlin POUR, DO;  Location: [facility];  Service: Endoscopy;;   CATARACT EXTRACTION W/PHACO Right 08/19/2021   Procedure: CATARACT EXTRACTION PHACO AND INTRAOCULAR LENS PLACEMENT (IOC);  Surgeon: Harrie Agent, MD;  Location: [facility] ORS;  Service: Ophthalmology;  Laterality: Right;  CDE: 7.02    CATARACT EXTRACTION W/PHACO Left 09/30/2021   Procedure: CATARACT EXTRACTION PHACO AND INTRAOCULAR LENS PLACEMENT (IOC);  Surgeon: Harrie Agent, MD;  Location: [facility];  Service: Ophthalmology;  Laterality: Left;  CDE 4.61   COLONOSCOPY  03/22/2010   RMR: 1. Normal rectum 2. Diminutive polyp at the mouth of the ileocecal valve  status post cold biopsy removal. Remaideer of the colonic mucosa and terminal ileum mucosa appeared normal.    COLONOSCOPY N/A 06/25/2015   Procedure: COLONOSCOPY;  Surgeon: Lamar CHRISTELLA Hollingshead, MD;  Location: [facility];  Service: Endoscopy;  Laterality: N/A;  200-rescheduled 1/30 per Ginger    COLONOSCOPY WITH PROPOFOL  N/A 12/24/2020   Procedure: COLONOSCOPY WITH PROPOFOL ;  Surgeon: Cindie Carlin POUR, DO;  Location: [facility];  Service: Endoscopy;  Laterality: N/A;  ASA III / 8:00   CORONARY BALLOON ANGIOPLASTY N/A 04/24/2017   Procedure: CORONARY BALLOON ANGIOPLASTY;  Surgeon: Dann Candyce RAMAN, MD;  Location: [facility] INVASIVE CV LAB;  Service: Cardiovascular;  Laterality: N/A;   LEFT HEART CATH AND CORONARY ANGIOGRAPHY N/A 04/24/2017   Procedure: LEFT HEART CATH AND CORONARY ANGIOGRAPHY;  Surgeon: Dann Candyce RAMAN, MD;   Location: [facility] INVASIVE CV LAB;  Service: Cardiovascular;  Laterality: N/A;   LUMBAR LAMINECTOMY  06/02/2012   LUMBAR LAMINECTOMY/DECOMPRESSION MICRODISCECTOMY  06/02/2012   Procedure: LUMBAR LAMINECTOMY/DECOMPRESSION MICRODISCECTOMY 1 LEVEL;  Surgeon: Catalina CHRISTELLA Stains, MD;  Location: [facility] NEURO ORS;  Service: Neurosurgery;  Laterality: Bilateral;  Bilateral Lumbar five-sacral one Foraminotomy and microdiskectomy    POLYPECTOMY  12/24/2020   Procedure: POLYPECTOMY;  Surgeon: Cindie Carlin POUR, DO;  Location: [facility];  Service: Endoscopy;;   TOTAL KNEE ARTHROPLASTY Right 05/16/2022   Procedure: TOTAL KNEE ARTHROPLASTY;  Surgeon: Shari Sieving, MD;  Location: [facility] ORS;  Service: Orthopedics;  Laterality: Right;    Family History  Problem Relation Age of Onset   Heart attack Mother    Diabetes Mother    Stroke Father    Heart attack Father    Arthritis Other    Colon cancer Sister 2       Estimated age of onset; just found out about it in 65.   Neuropathy Neg Hx     Social History   Socioeconomic History   Marital status: Married    Spouse name: Not on file   Number of children: 1   Years of education: Not on file   Highest education level: Not on file  Occupational History   Occupation: retired  Tobacco Use   Smoking status: Former    Current packs/day: 0.00    Average packs/day: 0.1 packs/day for 20.0 years (2.0 ttl pk-yrs)    Types: Cigarettes    Start date: 05/08/1979    Quit date: 05/08/1999    Years since quitting: 24.7   Smokeless tobacco: Never  Vaping Use   Vaping status: Never Used  Substance and Sexual Activity   Alcohol use: No   Drug use: No   Sexual activity: Yes  Other Topics Concern   Not on file  Social History Narrative   Patient is right handed.   Patient drinks caffeine occasionally   Social Drivers of Corporate investment banker Strain: Low Risk  (10/29/2021)   Overall Financial Resource Strain (CARDIA)    Difficulty of Paying Living  Expenses: Not hard at all  Food Insecurity: No Food Insecurity (07/15/2023)   Hunger Vital Sign    Worried About Running Out of Food in the Last Year: Never true    Ran Out of Food in the Last Year: Never true  Transportation Needs: No Transportation Needs (07/15/2023)   PRAPARE - Administrator, Civil Service (Medical): No    Lack of Transportation (Non-Medical): No  Physical Activity: Inactive (10/29/2021)   Exercise Vital Sign    Days of Exercise per Week: 0 days    Minutes of Exercise per Session: 0 min  Stress: No Stress Concern Present (10/29/2021)   [employer] of Occupational Health - Occupational Stress Questionnaire    Feeling of Stress : Not at all  Social Connections: Socially  Integrated (07/08/2023)   Social Connection and Isolation Panel    Frequency of Communication with Friends and Family: More than three times a week    Frequency of Social Gatherings with Friends and Family: More than three times a week    Attends Religious Services: More than 4 times per year    Active Member of [employer] or Organizations: Yes    Attends Engineer, structural: More than 4 times per year    Marital Status: Married  Catering manager Violence: Not At Risk (07/15/2023)   Humiliation, Afraid, Rape, and Kick questionnaire    Fear of Current or Ex-Partner: No    Emotionally Abused: No    Physically Abused: No    Sexually Abused: No    Outpatient Medications Prior to Visit  Medication Sig Dispense Refill   albuterol  (VENTOLIN  HFA) 108 (90 Base) MCG/ACT inhaler Inhale 2 puffs into the lungs every 6 (six) hours as needed for wheezing or shortness of breath. 18 g 3   amLODipine  (NORVASC ) 10 MG tablet TAKE ONE TABLET BY MOUTH ONCE DAILY. 90 tablet 2   apixaban  (ELIQUIS ) 5 MG TABS tablet Take 1 tablet (5 mg total) by mouth 2 (two) times daily. Has 30 day free voucher 60 tablet 6   atropine  1 % ophthalmic solution Place 1 drop into the right eye 3 (three) times daily.      calcitRIOL  (ROCALTROL ) 0.25 MCG capsule Take 0.25 mcg by mouth every Monday, Wednesday, and Friday.     Coenzyme Q10 100 MG TABS Take 1 tablet by mouth daily.     Coenzyme Q10-Vitamin E 100-150 MG-UNIT CAPS Take 1 capsule by mouth at bedtime.     ezetimibe  (ZETIA ) 10 MG tablet TAKE (1) TABLET BY MOUTH ONCE DAILY. 90 tablet 3   FARXIGA 5 MG TABS tablet Take 5 mg by mouth daily.     furosemide  (LASIX ) 20 MG tablet TAKE 1 TABLET BY MOUTH ONCE DAILY. 90 tablet 2   hydrALAZINE  (APRESOLINE ) 25 MG tablet TAKE (1) TABLET BY MOUTH TWICE DAILY. 180 tablet 1   latanoprost  (XALATAN ) 0.005 % ophthalmic solution Place 1 drop into the left eye at bedtime.     metoprolol  succinate (TOPROL -XL) 50 MG 24 hr tablet TAKE (1) TABLET BY MOUTH ONCE DAILY. TAKE WITH OR IMMEDIATELY FOLLOWING A MEAL. 90 tablet 3   Misc Natural Products (OSTEO BI-FLEX ADV JOINT SHIELD) TABS Take 1 tablet by mouth daily.     Multiple Vitamins-Minerals (CENTRUM PO) Take 1 tablet by mouth every morning.     olmesartan  (BENICAR ) 5 MG tablet Take 1 tablet (5 mg total) by mouth daily. 90 tablet 4   pregabalin  (LYRICA ) 200 MG capsule Take 1 capsule (200 mg total) by mouth 2 (two) times daily. 60 capsule 5   rosuvastatin  (CRESTOR ) 10 MG tablet TAKE (1) TABLET BY MOUTH ONCE DAILY. 90 tablet 3   sodium bicarbonate  650 MG tablet Take 650 mg by mouth 3 (three) times daily.     timolol  (TIMOPTIC ) 0.5 % ophthalmic solution 1 drop 2 (two) times daily.     tobramycin  (TOBREX ) 0.3 % ophthalmic solution Place 1 drop into both eyes every 6 (six) hours.     No facility-administered medications prior to visit.    Allergies  Allergen Reactions   Levaquin  [Levofloxacin ] Itching   Statins Other (See Comments)    Myalgias    ROS Review of Systems  Constitutional:  Positive for fatigue. Negative for chills and fever.  HENT:  Negative for congestion  and sore throat.   Eyes:  Negative for pain and discharge.  Respiratory:  Positive for shortness of breath  (Intermittent, upon exertion) and wheezing. Negative for cough.   Cardiovascular:  Negative for chest pain and palpitations.  Gastrointestinal:  Negative for diarrhea, nausea and vomiting.  Endocrine: Negative for polydipsia and polyuria.  Genitourinary:  Negative for dysuria and hematuria.  Musculoskeletal:  Negative for neck pain and neck stiffness.  Skin:  Negative for rash.  Neurological:  Positive for weakness and numbness. Negative for dizziness and headaches.  Psychiatric/Behavioral:  Negative for agitation and behavioral problems.       Objective:    Physical Exam Vitals reviewed.  Constitutional:      General: He is not in acute distress.    Appearance: He is not diaphoretic.  HENT:     Head: Normocephalic and atraumatic.     Nose: Nose normal.     Mouth/Throat:     Mouth: Mucous membranes are moist.  Eyes:     General: No scleral icterus.    Extraocular Movements: Extraocular movements intact.  Cardiovascular:     Rate and Rhythm: Normal rate and regular rhythm.     Heart sounds: Normal heart sounds. No murmur heard. Pulmonary:     Breath sounds: No wheezing or rales.     Comments: On 4 l O2 in office Musculoskeletal:     Cervical back: Neck supple. No tenderness.     Right lower leg: No edema.     Left lower leg: No edema.  Skin:    General: Skin is warm.     Findings: No rash.  Neurological:     General: No focal deficit present.     Mental Status: He is alert and oriented to person, place, and time.     Sensory: Sensory deficit (B/l feet) present.     Motor: Weakness (4/5 in b/l LE) present.     Gait: Gait abnormal (slow).  Psychiatric:        Mood and Affect: Mood normal.        Behavior: Behavior normal.     BP 127/60   Pulse (!) 109   Ht 5' 9 (1.753 m)   Wt 226 lb (102.5 kg)   SpO2 90%   BMI 33.37 kg/m  Wt Readings from Last 3 Encounters:  01/18/24 226 lb (102.5 kg)  12/11/23 224 lb (101.6 kg)  10/27/23 221 lb 1.6 oz (100.3 kg)    Lab  Results  Component Value Date   TSH 1.580 08/17/2023   Lab Results  Component Value Date   WBC 7.8 08/17/2023   HGB 12.8 (L) 08/17/2023   HCT 38.5 08/17/2023   MCV 91 08/17/2023   PLT 178 08/17/2023   Lab Results  Component Value Date   NA 145 (H) 09/28/2023   K 5.0 09/28/2023   CO2 25 09/28/2023   GLUCOSE 91 09/28/2023   BUN 43 (H) 09/28/2023   CREATININE 1.95 (H) 09/28/2023   BILITOT 0.9 07/08/2023   ALKPHOS 55 07/08/2023   AST 29 07/08/2023   ALT 21 07/08/2023   PROT 8.0 07/08/2023   ALBUMIN 3.6 07/08/2023   CALCIUM  9.6 09/28/2023   ANIONGAP 6 07/13/2023   EGFR 35 (L) 09/28/2023   Lab Results  Component Value Date   CHOL 176 03/20/2023   Lab Results  Component Value Date   HDL 51 03/20/2023   Lab Results  Component Value Date   LDLCALC 102 (H) 03/20/2023   Lab Results  Component Value Date   TRIG 132 03/20/2023   Lab Results  Component Value Date   CHOLHDL 3.5 03/20/2023   Lab Results  Component Value Date   HGBA1C 6.4 (H) 03/20/2023      Assessment & Plan:   Problem List Items Addressed This Visit       Cardiovascular and Mediastinum   Hypertension   BP Readings from Last 1 Encounters:  01/18/24 127/60   Well-controlled with amlodipine  10 mg once daily,  Olmesartan  5 mg once daily, metoprolol  50 mg QD and hydralazine  25 mg BID Counseled for compliance with the medications Advised DASH diet and moderate exercise/walking as tolerated      CAD (coronary artery disease)   S/p stent placement On aspirin  and statin On Metoprolol  Denies chest pain currently        Respiratory   COPD (chronic obstructive pulmonary disease) (HCC)   Albuterol  PRN for dyspnea or wheezing Needs maintenance inhaler-Breztri  sample was provided in the last visit Follow up with pulmonology - needs to discuss maintenance inhaler      Chronic respiratory failure with hypoxia (HCC)   O2 sat at rest: 86% on room air O2 sat at rest with 4 LPM O2: 92% O2 sat upon  ambulation with 5 LMP O2: 91%  Likely from COPD, worse recently since last hospitalization Needs to continue using home O2 for now - POC/portable O2 ordered Followed by Pulmonology      Relevant Orders   For home use only DME oxygen      Endocrine   Type 2 diabetes mellitus with stage 3b chronic kidney disease, without long-term current use of insulin  (HCC)   Lab Results  Component Value Date   HGBA1C 6.4 (H) 03/20/2023    Well controlled On Farxiga 5 mg QD - for CKD as well Advised to follow diabetic diet On statin and ACEi F/u CMP and lipid panel Diabetic eye exam: Advised to follow up with Ophthalmology for diabetic eye exam      Relevant Orders   Bayer DCA Hb A1c Waived     Genitourinary   CKD (chronic kidney disease) stage 3, GFR 30-59 ml/min (HCC)   Last BMP reviewed -shows stable GFR, around 35 now Followed by nephrologist On sodium bicarb and Lasix  On Olmesartan  5 mg QD On Farxiga 5 mg once daily for proteinuria        Other   Encounter for general adult medical examination with abnormal findings - Primary   Physical exam as documented. Recent blood tests reviewed from chart. Advised to get Shingrix and Tdap vaccine at local pharmacy.       No orders of the defined types were placed in this encounter.   Follow-up: Return in about 4 months (around 05/19/2024) for HTN and DM.    Suzzane MARLA Blanch, MD

## 2024-01-18 NOTE — Assessment & Plan Note (Addendum)
 O2 sat at rest: 86% on room air O2 sat at rest with 4 LPM O2: 92% O2 sat upon ambulation with 5 LMP O2: 91%  Likely from COPD, worse recently since last hospitalization Needs to continue using home O2 for now - POC/portable O2 ordered Followed by Pulmonology

## 2024-01-18 NOTE — Assessment & Plan Note (Addendum)
 Lab Results  Component Value Date   HGBA1C 6.4 (H) 03/20/2023    Well controlled On Farxiga 5 mg QD - for CKD as well Advised to follow diabetic diet On statin and ACEi F/u CMP and lipid panel Diabetic eye exam: Advised to follow up with Ophthalmology for diabetic eye exam

## 2024-01-18 NOTE — Assessment & Plan Note (Addendum)
 Albuterol  PRN for dyspnea or wheezing Needs maintenance inhaler-Breztri  sample was provided in the last visit Follow up with pulmonology - needs to discuss maintenance inhaler

## 2024-01-18 NOTE — Assessment & Plan Note (Signed)
 Physical exam as documented. Recent blood tests reviewed from chart. Advised to get Shingrix and Tdap vaccine at local pharmacy.

## 2024-01-18 NOTE — Assessment & Plan Note (Signed)
 Last BMP reviewed -shows stable GFR, around 35 now Followed by nephrologist On sodium bicarb and Lasix  On Olmesartan  5 mg QD On Farxiga 5 mg once daily for proteinuria

## 2024-01-18 NOTE — Patient Instructions (Addendum)
 Please continue to take medications as prescribed.  Please continue to follow low carb diet and ambulate as tolerated.  Please perform leg elevation for leg swelling. Please take additional tablet of Lasix  if you have persistent leg swelling.  Please consider getting Shingrix and Tdap vaccine at local pharmacy.

## 2024-01-18 NOTE — Assessment & Plan Note (Signed)
 S/p stent placement On aspirin  and statin On Metoprolol  Denies chest pain currently

## 2024-01-21 ENCOUNTER — Ambulatory Visit: Admitting: Internal Medicine

## 2024-01-21 ENCOUNTER — Encounter: Payer: Self-pay | Admitting: Internal Medicine

## 2024-01-21 ENCOUNTER — Ambulatory Visit: Payer: Self-pay | Admitting: Internal Medicine

## 2024-01-21 VITALS — BP 147/68 | HR 70 | Ht 69.0 in | Wt 222.4 lb

## 2024-01-21 DIAGNOSIS — J9611 Chronic respiratory failure with hypoxia: Secondary | ICD-10-CM | POA: Diagnosis not present

## 2024-01-21 DIAGNOSIS — R0609 Other forms of dyspnea: Secondary | ICD-10-CM

## 2024-01-21 LAB — BAYER DCA HB A1C WAIVED: HB A1C (BAYER DCA - WAIVED): 6.5 % — ABNORMAL HIGH (ref 4.8–5.6)

## 2024-01-21 NOTE — Progress Notes (Signed)
 Kyle VEAR Clayton Mickey., male    DOB: Sep 16, 1945    MRN: [id number]   Brief patient profile:  39  yobm  quit smoking 04/1999 retired Psychologist, occupational  former Jude pt referred back to pulmonary clinic in [place]  08/17/2023 by [facility] hospitalist  for new hypoxemic resp failure in setting of ? Mulitfocal pna> admit .   Jude summary 7/18/ 2014   ex smoker, seen for initial pulmonary admission 11/10/11 with Massive PE and LLE DVT  tx w/ coumadin  . Hypercoagulable panel neg.  Echo showed severe pulmonary HTN, RV mildly dilated. Severe Pulm HTN w/ PAP 76  INR 3.9 on 6/28   Hypercoagulable panel was neg  Pulmonary HTN ? Etiology , no known hx prior to admit, autoimmune workup w/ borderline RF, elevated CRP and ESR , neg ANA . ACE nml .  Mother and sister with hx of clots.  No recent surgery , prolonged immobiity, known injury or extended air/car travel.  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  Rpt echo 04/29/12 showed sign improvement w/ Nml EF , PAP pressures return to nml, RV nml size.  Underwent back surgery (lumbar laminectomy w/ decompression ) on 06/03/11 , w/ Lovonex bridge   2015 restrictive changes on pfts/ no obstruction or concavity to f/v loo    Admit date:     07/08/2023  Discharge date: 07/13/23  Discharge Physician: MDALA-GAUSI, GOLDEN AGATHA       Discharge Diagnoses:   COPD exacerbation (HCC)   CKD (chronic kidney disease)   Hypertension   Hyperlipidemia   CAD (coronary artery disease)   Acute respiratory failure with hypoxia (HCC)   Type 2 diabetes mellitus with stage 3b chronic kidney disease, without long-term current use of insulin  [facility])       Hospital Course: 78 year old man with PMH of HTN, CAD, CKD, COPD who presented with shortness of breath, malaise and was found to have acute hypoxic respiratory failure due to multifocal pneumonia, COPD exacerbation.      Acute hypoxic respiratory failure due to COPD exacerbation, multifocal pneumonia. No oxygen  use at  baseline. Patient required up to 8 L/min supplemental oxygen . He was weaned down to 4L/min at rest and 6L/min with ambulation.  Home oxygen  was arranged.    Community-acquired pneumonia  CTA PE done on admission revealed bilateral densities - multifocal pneumonia.  Negative for PE.  Emphysematous changes noted. RVP was negative Patient was treated with azithromycin , ceftriaxone  while in hospital and discharged on Augmentin .   COPD exacerbation Patient says his COPD is not being actively managed. He does have an inhaler at home that he was given recently. He was treated with DuoNebs, steroids, antibiotics as above. An ambulatory referral to pulmonology was placed.   CAD Troponin was not elevated on admission. Patient denied chest pain. Continued home aspirin , statin, beta-blockers.   Prediabetes Home Jardiance was resumed at discharge.   Peripheral neuropathy Continued home Lyrica .   Stage III CKD Baseline creatinine appears to be 2-2.3. Patient's renal function remained at baseline.       History of Present Illness  08/17/2023  Pulmonary/ 1st office eval/ Deztinee Lohmeyer / [place] [facility] timolol / hydralazine  / has avasc necrosis ? SS?  Chief Complaint  Patient presents with   Establish Care    Last seen 2014  Dyspnea:  room to room on 2lpm (not sure)  feels losing ground vs admit  Cough: mucus is white / 2 coughs to clear each am Sleep: flat in bd 1-2 pillows  s orthopnea/ pnd or noct cough  SABA use: breztri  no better  02 use:24/7 usually 2lpm does not titrate  Rec Please remember to go to the lab department   for your tests - we will call you with the results when they are available. Turn your 02 up to 6 lpm walking and 4lpm on your concentrator for now  If condition worsens while waiting on lab results please go to [facility] [place].    09/28/2023  f/u ov/[place] office/Mateo Overbeck re: new resp falure multifocal pna  maint  Chief Complaint  Patient presents with    Shortness of Breath  Dyspnea:  mb and back = 100 ft slt uphill to  house from MB  stops halfway back on up to 6lpm cont  but does not titrate higher as rec  Cough: none  Sleeping: bed is flat one pillow s resp cc  SABA use: none  02: 4lpm sleeping / sitting  Rec Make sure you check your oxygen  saturation  AT  your highest level of activity (not after you stop)   to be sure it stays over 90  10/01/23 trial of low dose prednisone  > no better   HRCT 10/13/23 c/w post ALI (from CAP) PF and not c/w UIP    10/27/2023  f/u ov/[place] office/Gerlean Cid re: PF s/p ALI  maint on 02   Dyspnea:  no better but not titrating 02 up with activity  Cough: none  Sleeping: flat bed one pillow s    resp cc  SABA use: none  02: 4lpm concentrator / 5lpm walking cont Rec Make sure you check your oxygen  saturation  AT  your highest level of activity (not after you stop)   to be sure it stays over 90% Please schedule a follow up visit in 3 months but call sooner if needed   - Echocardiogram 11/11/23 > nl lv/ mild decrease RV function/ no shunt.  Apical thrombus > referred to [facility]      01/21/2024  f/u ov/ office/Avneet Ashmore re: post ALI (from CAP) PF and not c/w UIP  maint on no rx x for 02   Chief Complaint  Patient presents with   Shortness of Breath    DOE - cough sometimes   Dyspnea:  slowed by L hip more than breath  Cough: none  Sleeping: flat bed / 1pillow    resp cc  SABA use: no 02: 4 lpm conc > 5 lpm with tank    No obvious day to day or daytime variability or assoc excess/ purulent sputum or mucus plugs or hemoptysis or cp or chest tightness, subjective wheeze or overt sinus or hb symptoms.    Also denies any obvious fluctuation of symptoms with weather or environmental changes or other aggravating or alleviating factors except as outlined above   No unusual exposure hx or h/o childhood pna/ asthma or knowledge of premature birth.  Current Allergies, Complete Past Medical History, Past  Surgical History, Family History, and Social History were reviewed in [employer] record.  ROS  The following are not active complaints unless bolded Hoarseness, sore throat, dysphagia, dental problems, itching, sneezing,  nasal congestion or discharge of excess mucus or purulent secretions, ear ache,   fever, chills, sweats, unintended wt loss or wt gain, classically pleuritic or exertional cp,  orthopnea pnd or arm/hand swelling  or leg swelling, presyncope, palpitations, abdominal pain, anorexia, nausea, vomiting, diarrhea  or change in bowel habits or change in bladder habits, change in  stools or change in urine, dysuria, hematuria,  rash, arthralgias, visual complaints, headache, numbness, weakness or ataxia or problems with walking or coordination,  change in mood or  memory.        Current Meds  Medication Sig   amLODipine  (NORVASC ) 10 MG tablet TAKE ONE TABLET BY MOUTH ONCE DAILY.   apixaban  (ELIQUIS ) 5 MG TABS tablet Take 1 tablet (5 mg total) by mouth 2 (two) times daily. Has 30 day free voucher   calcitRIOL  (ROCALTROL ) 0.25 MCG capsule Take 0.25 mcg by mouth every Monday, Wednesday, and Friday.   Coenzyme Q10 100 MG TABS Take 1 tablet by mouth daily.   Coenzyme Q10-Vitamin E 100-150 MG-UNIT CAPS Take 1 capsule by mouth at bedtime.   ezetimibe  (ZETIA ) 10 MG tablet TAKE (1) TABLET BY MOUTH ONCE DAILY.   FARXIGA 5 MG TABS tablet Take 5 mg by mouth daily.   furosemide  (LASIX ) 20 MG tablet TAKE 1 TABLET BY MOUTH ONCE DAILY.   hydrALAZINE  (APRESOLINE ) 25 MG tablet TAKE (1) TABLET BY MOUTH TWICE DAILY.   latanoprost  (XALATAN ) 0.005 % ophthalmic solution Place 1 drop into the left eye at bedtime.   metoprolol  succinate (TOPROL -XL) 50 MG 24 hr tablet TAKE (1) TABLET BY MOUTH ONCE DAILY. TAKE WITH OR IMMEDIATELY FOLLOWING A MEAL.   Misc Natural Products (OSTEO BI-FLEX ADV JOINT SHIELD) TABS Take 1 tablet by mouth daily.   Multiple Vitamins-Minerals (CENTRUM PO) Take 1  tablet by mouth every morning.   olmesartan  (BENICAR ) 5 MG tablet Take 1 tablet (5 mg total) by mouth daily.   pregabalin  (LYRICA ) 200 MG capsule Take 1 capsule (200 mg total) by mouth 2 (two) times daily.   rosuvastatin  (CRESTOR ) 10 MG tablet TAKE (1) TABLET BY MOUTH ONCE DAILY.   sodium bicarbonate  650 MG tablet Take 650 mg by mouth 3 (three) times daily.   timolol  (TIMOPTIC ) 0.5 % ophthalmic solution 1 drop 2 (two) times daily.   tobramycin  (TOBREX ) 0.3 % ophthalmic solution Place 1 drop into both eyes every 6 (six) hours.           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   Hyperlipidemia    Pulmonary embolism (HCC) 10/2011      Objective:    Wts  01/21/2024       222 10/27/2023         221   09/28/23 217 lb (98.4 kg)  08/20/23 217 lb 9.6 oz (98.7 kg)  08/17/23 217 lb (98.4 kg)    Vital signs reviewed  01/21/2024  - Note at rest 02 sats  93% on  5lpm NP   General appearance:    somber amb black male nad    HEENT : Oropharynx  clear      Nasal turbinates  nl    NECK :  without  apparent JVD/ palpable Nodes/TM    LUNGS: no acc muscle use,  Nl contour chest with distant crackles both bases but no cough on insp maneuvers    CV:  RRR  no s3 or murmur or increase in  P2, and no edema   ABD:  soft and nontender   MS:   ext warm without deformities Or obvious joint restrictions  calf tenderness, cyanosis or clubbing    SKIN: warm and dry without lesions    NEURO:  alert, approp, nl sensorium with  no motor or cerebellar deficits apparent.         Lab Results  Component Value Date    ESRSEDRATE 115 (H) 09/28/2023   ESRSEDRATE 111 (H) 08/17/2023   ESRSEDRATE 111 (H) 11/12/2011       Assessment   Assessment & Plan DOE (dyspnea on exertion) Onset of symptoms about 10 d prior to admit 07/08/23 with dx of ARF/ CAP- NOS  - see chronic resp failure a/p - Collagen vasc dz profile 08/17/2023  neg x for ESR 111 - ACE level 08/17/2023 neg - Quant TB 08/17/2023  neg  - HRCT 10/13/23 c/w post ALI (from CAP) PF and not c/w UIP  - 10/01/23 trial of low dose prednisone  > no better - Echocardiogram 11/11/23 > nl lv/ mild decrease RV function/ no shunt.  Apical thrombus > referred to [facility]   Now 6 m out from ALI and no better > refer to PF clinic Chronic respiratory failure with hypoxia [facility]) S/p PE  2014 s residual PH  - new doe 06/2023 > see admit 07/08/23  - 08/17/2023   Walked on 4lpm  x   approx 75  ft  @ slow pace, stopped due to desats with lowest 02 sats 82% then increased to 6lpm to get sats to 84% and 8 lpm  to get to 88% and complete another 75 ft  - 09/28/2023   Walked on 8lpm cont   x  2  lap(s) =  approx 300  ft  @ slow pace, stopped due to desats to 87%  with lowest 02 sats 87%    - HRCT 10/13/23 c/w post ALI (from CAP) PF and not c/w UIP  - 10/27/2023   Walked on 4lpm   x  1  lap(s) =  approx 150  ft  @ slow  pace, stopped due to sob with desats resolved on 6lpm  with lowest 02 sats 91% but still stopped due to doe.  Pt has definitely plateaued at this point 6 m out from ALI and still the same level of 02 dep but nothing else to offer in this clinic and willing to go to [facility] PF clinic for a second opinion > referred   Pulmonary f/u in this clinic prn.      Each maintenance medication was reviewed in detail including emphasizing most importantly the difference between maintenance and prns and under what circumstances the prns are to be triggered using an action plan format where appropriate.  Total time for H and P, chart review, counseling, reviewing 02/pulse ox  device(s) and  generating customized AVS unique to this office visit / same day charting = 25 min summary f/u ov          AVS  Patient Instructions  I am referring you to Pulmonary fibrosis clinic in [facility]  Make sure you check your oxygen  saturation  AT  your highest level of activity (not after you stop)   to be sure it stays over 90% and adjust  02 flow upward to maintain this level if needed but remember to turn it back to previous settings when you stop (to conserve your supply).   Follow up in this clinic is as  needed    Ozell America, MD 01/23/2024

## 2024-01-21 NOTE — Patient Instructions (Signed)
 I am referring you to Pulmonary fibrosis clinic in [facility]  Make sure you check your oxygen  saturation  AT  your highest level of activity (not after you stop)   to be sure it stays over 90% and adjust  02 flow upward to maintain this level if needed but remember to turn it back to previous settings when you stop (to conserve your supply).   Follow up in this clinic is as needed

## 2024-01-23 NOTE — Assessment & Plan Note (Addendum)
 Onset of symptoms about 10 d prior to admit 07/08/23 with dx of ARF/ CAP- NOS  - see chronic resp failure a/p - Collagen vasc dz profile 08/17/2023  neg x for ESR 111 - ACE level 08/17/2023 neg - Quant TB 08/17/2023  neg  - HRCT 10/13/23 c/w post ALI (from CAP) PF and not c/w UIP  - 10/01/23 trial of low dose prednisone  > no better - Echocardiogram 11/11/23 > nl lv/ mild decrease RV function/ no shunt.  Apical thrombus > referred to [facility]   Now 6 m out from ALI and no better > refer to PF clinic

## 2024-01-23 NOTE — Assessment & Plan Note (Addendum)
 S/p PE  2014 s residual PH  - new doe 06/2023 > see admit 07/08/23  - 08/17/2023   Walked on 4lpm  x   approx 75  ft  @ slow pace, stopped due to desats with lowest 02 sats 82% then increased to 6lpm to get sats to 84% and 8 lpm  to get to 88% and complete another 75 ft  - 09/28/2023   Walked on 8lpm cont   x  2  lap(s) =  approx 300  ft  @ slow pace, stopped due to desats to 87%  with lowest 02 sats 87%    - HRCT 10/13/23 c/w post ALI (from CAP) PF and not c/w UIP  - 10/27/2023   Walked on 4lpm   x  1  lap(s) =  approx 150  ft  @ slow  pace, stopped due to sob with desats resolved on 6lpm  with lowest 02 sats 91% but still stopped due to doe.  Pt has definitely plateaued at this point 6 m out from ALI and still the same level of 02 dep but nothing else to offer in this clinic and willing to go to [facility] PF clinic for a second opinion > referred   Pulmonary f/u in this clinic prn.      Each maintenance medication was reviewed in detail including emphasizing most importantly the difference between maintenance and prns and under what circumstances the prns are to be triggered using an action plan format where appropriate.  Total time for H and P, chart review, counseling, reviewing 02/pulse ox  device(s) and generating customized AVS unique to this office visit / same day charting = 25 min summary f/u ov

## 2024-02-02 ENCOUNTER — Encounter: Payer: Self-pay | Admitting: Cardiology

## 2024-02-02 ENCOUNTER — Ambulatory Visit: Attending: Cardiology | Admitting: Cardiology

## 2024-02-02 VITALS — BP 128/62 | HR 70 | Ht 69.5 in | Wt 228.8 lb

## 2024-02-02 DIAGNOSIS — I10 Essential (primary) hypertension: Secondary | ICD-10-CM

## 2024-02-02 DIAGNOSIS — N1832 Chronic kidney disease, stage 3b: Secondary | ICD-10-CM | POA: Diagnosis not present

## 2024-02-02 DIAGNOSIS — E782 Mixed hyperlipidemia: Secondary | ICD-10-CM | POA: Diagnosis not present

## 2024-02-02 DIAGNOSIS — I513 Intracardiac thrombosis, not elsewhere classified: Secondary | ICD-10-CM

## 2024-02-02 DIAGNOSIS — I25119 Atherosclerotic heart disease of native coronary artery with unspecified angina pectoris: Secondary | ICD-10-CM | POA: Diagnosis not present

## 2024-02-02 NOTE — Progress Notes (Signed)
 Cardiology Office Note  Date: 02/02/2024   ID: Kyle Jocson., DOB 1946/04/02, MRN [id number]  History of Present Illness: Kyle Patel. is a 78 y.o. male last seen in August 2024.  He is here for a follow-up visit.  I reviewed interval chart and follow-up with Dr. Darlean.  He was hospitalized back in February with multifocal pneumonia and suspected COPD exacerbation with subsequently chronic hypoxic respiratory failure requiring continued oxygen  supplementation at 4 L.  He had an echocardiogram obtained by Dr. Darlean back in June to look for possible right-to-left shunting, bubble study was negative.  Incidentally, he was found to have an LV apical thrombus in region of hypokinetic segment, although overall LVEF is normal range at 60 to 65%.  He has subsequently been started on Eliquis  and is tolerating this so far.  He he does not report any angina at this time, NYHA class II or III dyspnea depending on level of activity.  No palpitations or syncope.  Medications reviewed.  He is due for follow-up lipid panel.  Last LDL was 102.  We discussed the results of his echocardiogram from June.  Physical Exam: VS:  BP 128/62 (BP Location: Right Arm, Cuff Size: Large)   Pulse 70   Ht 5' 9.5 (1.765 m)   Wt 228 lb 12.8 oz (103.8 kg)   SpO2 99% Comment: 4 LPM via [place]  BMI 33.30 kg/m , BMI Body mass index is 33.3 kg/m.  Wt Readings from Last 3 Encounters:  02/02/24 228 lb 12.8 oz (103.8 kg)  01/21/24 222 lb 6.4 oz (100.9 kg)  01/18/24 226 lb (102.5 kg)    General: Patient appears comfortable at rest.  Wearing oxygen  via nasal cannula. HEENT: Conjunctiva and lids normal. Neck: Supple, no elevated JVP or carotid bruits. Lungs: Decreased breath sounds throughout, no wheezing. Cardiac: Regular rate and rhythm, no S3 or significant systolic murmur. Extremities: No pitting edema.  ECG:  An ECG dated 07/08/2023 was personally reviewed today and demonstrated:  Sinus rhythm with increased  voltage/IVCD, old anterior infarct pattern, left anterior fascicular block.  Labwork: 07/08/2023: ALT 21; AST 29 08/17/2023: Hemoglobin 12.8; Platelets 178; TSH 1.580 09/28/2023: BNP 139.0; BUN 43; Creatinine, Ser 1.95; Potassium 5.0; Sodium 145     Component Value Date/Time   CHOL 176 03/20/2023 0922   TRIG 132 03/20/2023 0922   HDL 51 03/20/2023 0922   CHOLHDL 3.5 03/20/2023 0922   CHOLHDL 3.0 07/25/2022 0815   VLDL 12 07/25/2022 0815   LDLCALC 102 (H) 03/20/2023 0922   Other Studies Reviewed Today:  Echocardiogram 11/11/2023:  1. LV apical filling defect which is suspicious for LV thrombus in the  setting of apical hypokinesis. Left ventricular ejection fraction, by  estimation, is 60 to 65%. Left ventricular ejection fraction by 2D MOD  biplane is 64.7 %. The left ventricle has   normal function. The left ventricle demonstrates regional wall motion  abnormalities (see scoring diagram/findings for description). Left  ventricular diastolic parameters are consistent with Grade I diastolic  dysfunction (impaired relaxation). There is  severe hypokinesis of the left ventricular, apical apical segment.   2. Right ventricular systolic function is low normal. The right  ventricular size is normal. Tricuspid regurgitation signal is inadequate  for assessing PA pressure.   3. The mitral valve is grossly normal. Trivial mitral valve  regurgitation.   4. The aortic valve is tricuspid. Aortic valve regurgitation is not  visualized.   5. The inferior vena cava is  normal in size with <50% respiratory  variability, suggesting right atrial pressure of 8 mmHg.   6. Agitated saline contrast bubble study was negative, with no evidence  of any interatrial shunt.   Assessment and Plan:  1.  CAD status post balloon angioplasty of the distal LAD in the setting of NSTEMI in 2018.  Follow-up Lexiscan  Myoview  in 2022 was low risk.  He does not report any active angina or nitroglycerin  use.  No longer  on antiplatelet regimen given initiation of Eliquis  as discussed below.  Continue Farxiga 5 mg daily, Zetia  10 mg daily, and Crestor  10 mg daily.  2.  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  Now on Eliquis  5 mg twice daily.  No spontaneous bleeding problems reported.   3.  Primary hypertension.  Blood pressure control is good today.  Continue Benicar  5 mg daily, Toprol -XL 50 mg daily, hydralazine  25 mg twice daily, and Norvasc  10 mg daily.   4.  Mixed hyperlipidemia.  LDL 102 in October 2024.  Currently on Crestor  10 mg daily and Zetia  10 mg daily.  Check follow-up FLP.   5.  CKD stage IIIb.  Creatinine 1.95 with GFR 35 in May.  6.  Chronic hypoxic respiratory failure on supplemental oxygen .  Keep follow-up with pulmonary.  Disposition:  Follow up 6 months.  Signed, Jayson JUDITHANN Sierras, M.D., F.A.C.C. [facility]

## 2024-02-02 NOTE — Patient Instructions (Signed)
Medication Instructions:  °Your physician recommends that you continue on your current medications as directed. Please refer to the Current Medication list given to you today. ° ° °Labwork: °Fasting Lipids ° °Testing/Procedures: °None today ° °Follow-Up: °6 months ° °Any Other Special Instructions Will Be Listed Below (If Applicable). ° °If you need a refill on your cardiac medications before your next appointment, please call your pharmacy. ° °

## 2024-02-13 LAB — LIPID PANEL
Cholesterol: 155 mg/dL (ref <200)
HDL: 52 mg/dL (ref >=40)
LDL Cholesterol (Calc): 86 mg/dL
Non-HDL Cholesterol (Calc): 103 mg/dL (ref <130)
Total CHOL/HDL Ratio: 3 (calc) (ref <5.0)
Triglycerides: 81 mg/dL (ref <150)

## 2024-02-14 ENCOUNTER — Other Ambulatory Visit: Payer: Self-pay | Admitting: Internal Medicine

## 2024-02-14 DIAGNOSIS — G609 Hereditary and idiopathic neuropathy, unspecified: Secondary | ICD-10-CM

## 2024-02-15 ENCOUNTER — Ambulatory Visit: Payer: Self-pay | Admitting: Cardiology

## 2024-02-15 NOTE — Telephone Encounter (Signed)
 Kyle KANDICE Sierras, MD 02/15/2024  8:12 AM EDT     Results reviewed.  LDL coming down by follow-up lipid panel, now 86.  Continue with current medications.    Discussed results with patient

## 2024-02-15 NOTE — Telephone Encounter (Signed)
 Patient is returning phone call.

## 2024-03-04 ENCOUNTER — Telehealth: Payer: Self-pay

## 2024-03-04 NOTE — Telephone Encounter (Signed)
 Copied from CRM [id number]. Topic: Referral - Status >> Mar 02, 2024 12:22 PM Leila BROCKS wrote: Reason for CRM: Patient [phone] states has not received a call from anyone from Pulmonary fibrosis clinic in [facility] referred by Dr. Darlean in office visit 01/21/24. Unable to connect with [facility] [place], call went to [facility] and per [facility] [place] does not see any information send message to clinical. Please advise and call back.  Dr wert please advise

## 2024-03-07 ENCOUNTER — Other Ambulatory Visit: Payer: Self-pay | Admitting: Cardiology

## 2024-03-07 NOTE — Telephone Encounter (Signed)
 Spoke with patient regarding the 04/10/24 9:00 am appointment with Dr. Geronimo at [address], [place], [place]. Will mail information to patient and he voiced his understanding

## 2024-03-10 MED ORDER — EZETIMIBE 10 MG PO TABS: 10.0000 mg | ORAL_TABLET | Freq: Every day | ORAL | 3 refills | Status: AC

## 2024-03-16 NOTE — Telephone Encounter (Signed)
 Copied from CRM [id number]. Topic: General - Other >> Mar 03, 2024  2:39 PM Rilla B wrote: Reason for CRM: Patient states he's called before.  Patient stating Dr Darlean was writing him a referral to some type of service in [place] (?).  Patient has not  heard from office.  Please call patient @ [phone]  See phone note from ms kay

## 2024-03-31 ENCOUNTER — Ambulatory Visit: Admitting: Internal Medicine

## 2024-03-31 ENCOUNTER — Encounter: Payer: Self-pay | Admitting: Internal Medicine

## 2024-03-31 ENCOUNTER — Telehealth: Payer: Self-pay | Admitting: Internal Medicine

## 2024-03-31 VITALS — BP 122/64 | HR 80 | Temp 98.2°F | Ht 69.5 in | Wt 226.6 lb

## 2024-03-31 DIAGNOSIS — J439 Emphysema, unspecified: Secondary | ICD-10-CM | POA: Diagnosis not present

## 2024-03-31 DIAGNOSIS — Z23 Encounter for immunization: Secondary | ICD-10-CM | POA: Diagnosis not present

## 2024-03-31 DIAGNOSIS — J9611 Chronic respiratory failure with hypoxia: Secondary | ICD-10-CM | POA: Diagnosis not present

## 2024-03-31 DIAGNOSIS — Z7185 Encounter for immunization safety counseling: Secondary | ICD-10-CM

## 2024-03-31 DIAGNOSIS — R7989 Other specified abnormal findings of blood chemistry: Secondary | ICD-10-CM

## 2024-03-31 DIAGNOSIS — J849 Interstitial pulmonary disease, unspecified: Secondary | ICD-10-CM | POA: Diagnosis not present

## 2024-03-31 DIAGNOSIS — Z86711 Personal history of pulmonary embolism: Secondary | ICD-10-CM

## 2024-03-31 DIAGNOSIS — R0609 Other forms of dyspnea: Secondary | ICD-10-CM

## 2024-03-31 DIAGNOSIS — I288 Other diseases of pulmonary vessels: Secondary | ICD-10-CM

## 2024-03-31 NOTE — Progress Notes (Addendum)
 Kyle VEAR Clayton Mickey., male    DOB: [birth year]    MRN: [id number]   Brief patient profile:  1  yobm  quit smoking 04/1999 retired psychologist, occupational  former Jude pt referred back to pulmonary clinic in [place]  08/17/2023 by [facility] hospitalist  for new hypoxemic resp failure in setting of ? Mulitfocal pna> admit .   Jude summary 7/18/ 2014   ex smoker, seen for initial pulmonary admission 11/10/11 with Massive PE and LLE DVT  tx w/ coumadin  . Hypercoagulable panel neg.  Echo showed severe pulmonary HTN, RV mildly dilated. Severe Pulm HTN w/ PAP 76  INR 3.9 on 6/28   Hypercoagulable panel was neg  Pulmonary HTN ? Etiology , no known hx prior to admit, autoimmune workup w/ borderline RF, elevated CRP and ESR , neg ANA . ACE nml .  Mother and sister with hx of clots.  No recent surgery , prolonged immobiity, known injury or extended air/car travel.  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  Rpt echo 04/29/12 showed sign improvement w/ Nml EF , PAP pressures return to nml, RV nml size.  Underwent back surgery (lumbar laminectomy w/ decompression ) on 06/03/11 , w/ Lovonex bridge   2015 restrictive changes on pfts/ no obstruction or concavity to f/v loo    Admit date:     07/08/2023  Discharge date: 07/13/23  Discharge Physician: MDALA-GAUSI, GOLDEN AGATHA       Discharge Diagnoses:   COPD exacerbation (HCC)   CKD (chronic kidney disease)   Hypertension   Hyperlipidemia   CAD (coronary artery disease)   Acute respiratory failure with hypoxia (HCC)   Type 2 diabetes mellitus with stage 3b chronic kidney disease, without long-term current use of insulin  [facility])       Hospital Course: 78 year old man with PMH of HTN, CAD, CKD, COPD who presented with shortness of breath, malaise and was found to have acute hypoxic respiratory failure due to multifocal pneumonia, COPD exacerbation.      Acute hypoxic respiratory failure due to COPD exacerbation, multifocal pneumonia. No oxygen  use at  baseline. Patient required up to 8 L/min supplemental oxygen . He was weaned down to 4L/min at rest and 6L/min with ambulation.  Home oxygen  was arranged.    Community-acquired pneumonia  CTA PE done on admission revealed bilateral densities - multifocal pneumonia.  Negative for PE.  Emphysematous changes noted. RVP was negative Patient was treated with azithromycin , ceftriaxone  while in hospital and discharged on Augmentin .   COPD exacerbation Patient says his COPD is not being actively managed. He does have an inhaler at home that he was given recently. He was treated with DuoNebs, steroids, antibiotics as above. An ambulatory referral to pulmonology was placed.   CAD Troponin was not elevated on admission. Patient denied chest pain. Continued home aspirin , statin, beta-blockers.   Prediabetes Home Jardiance was resumed at discharge.   Peripheral neuropathy Continued home Lyrica .   Stage III CKD Baseline creatinine appears to be 2-2.3. Patient's renal function remained at baseline.       History of Present Illness  08/17/2023  Pulmonary/ 1st office eval/ [facility] / [place] Office timolol / hydralazine  / has avasc necrosis ? SS?  Chief Complaint  Patient presents with   Establish Care    Last seen 2014  Dyspnea:  room to room on 2lpm (not sure)  feels losing ground vs admit  Cough: mucus is white / 2 coughs to clear each am Sleep: flat in bd  1-2 pillows s orthopnea/ pnd or noct cough  SABA use: breztri  no better  02 use:24/7 usually 2lpm does not titrate  Rec Please remember to go to the lab department   for your tests - we will call you with the results when they are available. Turn your 02 up to 6 lpm walking and 4lpm on your concentrator for now  If condition worsens while waiting on lab results please go to [facility].    09/28/2023  f/u ov/[place] office/[facility] re: new resp falure multifocal pna  maint  Chief Complaint  Patient presents with    Shortness of Breath  Dyspnea:  mb and back = 100 ft slt uphill to  house from MB  stops halfway back on up to 6lpm cont  but does not titrate higher as rec  Cough: none  Sleeping: bed is flat one pillow s resp cc  SABA use: none  02: 4lpm sleeping / sitting  Rec Make sure you check your oxygen  saturation  AT  your highest level of activity (not after you stop)   to be sure it stays over 90  10/01/23 trial of low dose prednisone  > no better   HRCT 10/13/23 c/w post ALI (from CAP) PF and not c/w UIP    10/27/2023  f/u ov/[place] office/[facility] re: PF s/p ALI  maint on 02   Dyspnea:  no better but not titrating 02 up with activity  Cough: none  Sleeping: flat bed one pillow s    resp cc  SABA use: none  02: 4lpm concentrator / 5lpm walking cont Rec Make sure you check your oxygen  saturation  AT  your highest level of activity (not after you stop)   to be sure it stays over 90% Please schedule a follow up visit in 3 months but call sooner if needed   - Echocardiogram 11/11/23 > nl lv/ mild decrease RV function/ no shunt.  Apical thrombus > referred to [facility]      01/21/2024  f/u ov/[place] office/[facility] re: post ALI (from CAP) PF and not c/w UIP  maint on no rx x for 02   Chief Complaint  Patient presents with   Shortness of Breath    DOE - cough sometimes   Dyspnea:  slowed by L hip more than breath  Cough: none  Sleeping: flat bed / 1pillow    resp cc  SABA use: no 02: 4 lpm conc > 5 lpm with tank   OV 03/31/2024  Subjective:  Patient ID: Kyle VEAR Clayton Mickey., male , DOB: Feb 28, 1946 , age 78 y.o. , MRN: [id number] , ADDRESS: [address] [place] [facility] [address] PCP Tobie Suzzane POUR, MD Patient Care Team: Tobie Suzzane POUR, MD as PCP - General (Internal Medicine) Debera Jayson MATSU, MD as PCP - Cardiology (Cardiology) Leeann Hover, MD as Consulting Physician (Neurosurgery) Shaaron, Lamar HERO, MD as Consulting Physician (Gastroenterology) Darlean Ozell NOVAK, MD as Consulting  Physician (Pulmonary Disease)  This Provider for this visit: Treatment Team:  Attending Provider: Geronimo Amel, MD    03/31/2024 -   Chief Complaint  Patient presents with   Shortness of Breath   Consult    Pt stated ince LOV breathing has been about the same, a little bit better SOB w/ exertion, walking up stairs Prod cough ( phlegm clear)  Flu shot today. 4L of 02 continuous      HPI Kyle VEAR Clayton Mickey. 78 y.o. -   Kjuan Seipp. 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 He is accompanied by his wife. He was referred by Dr. Peg for evaluation of lung issues, including scar tissue and emphysema.  In February, he was hospitalized with pneumonia and has been on continuous oxygen  therapy since then. He uses four liters of oxygen  even while resting and reports no change in his condition since February. He can sit for two to two and a half hours without oxygen  but requires it during sleep. He experiences shortness of breath when dressing after a shower.  He has a history of emphysema. He was a former smoker, which is relevant to his lung condition. No current smoking, snoring, or sleep apnea.  He has a history of a blood clot in the lung from June 2013, which caused inflammation. A CT scan from two years ago showed some scar tissue. He also has a history of heart disease, having undergone balloon angioplasty in 2018.  He experiences some leg swelling and has been diagnosed with prediabetes. He is not very mobile due to a bad hip, for which he is receiving injections and may require surgery in the future.   He wants flu shot and prevnar but he had prevnar in 2023 per records  Wife indepdnent historian  CT Chest data from date:  HRCT 10/13/23  - personally visualized and independently interpreted : yes - my findings are: s belowe Narrative & Impression  CLINICAL DATA:  Interstitial lung disease.  Shortness of breath on exertion.   EXAM: CT CHEST WITHOUT CONTRAST   TECHNIQUE: Multidetector CT imaging of the chest was performed following the standard protocol without intravenous contrast. High resolution imaging of the lungs, as well as inspiratory and expiratory imaging, was performed.   RADIATION DOSE REDUCTION: This exam was performed according to the departmental dose-optimization program which includes automated exposure control, adjustment of the mA and/or kV according to patient size and/or use of iterative reconstruction technique.   COMPARISON:  07/08/2023 and 05/18/2022.   FINDINGS: Cardiovascular: Atherosclerotic calcification of the aorta, aortic valve and coronary arteries. Enlarged pulmonic trunk and heart. No pericardial effusion.   Mediastinum/Nodes: Small to borderline enlarged mediastinal lymph nodes, unchanged from 05/18/2022. No pathologically enlarged mediastinal or axillary lymph nodes. Hilar regions are difficult to definitively evaluate without IV contrast. Esophagus is grossly unremarkable.   Lungs/Pleura: Centrilobular and paraseptal emphysema. Superimposed mid and lower lung zone predominant traction bronchiectasis/bronchiolectasis with coarsened patchy parenchymal ground-glass and subpleural reticular densities. No definitive honeycombing. Calcified granulomas. No pleural fluid. No air trapping.   Upper Abdomen: Probable tiny cysts in the liver. No specific follow-up necessary. Visualized portions of the liver, gallbladder, adrenal glands, kidneys, spleen, pancreas, stomach and bowel are otherwise grossly unremarkable. No upper abdominal adenopathy. Small midline ventral hernias contain fat.   Musculoskeletal: Degenerative changes in the spine. Mild anterior wedging of several thoracic vertebral bodies, as before.   IMPRESSION: 1. Mid and lower lung zone predominant traction bronchiectasis/bronchiolectasis, patchy coarsened  ground-glass and scattered subpleural reticular densities, findings which may be postinfectious/postinflammatory in etiology. Fibrotic nonspecific interstitial pneumonitis is not excluded. Findings are suggestive of an alternative diagnosis (not UIP) per consensus guidelines: Diagnosis of Idiopathic Pulmonary Fibrosis: An Official ATS/ERS/JRS/ALAT Clinical Practice Guideline. Am JINNY Honey Crit Care Med Vol 198, Iss 5, [phone], Jan 24 2017. 2. Aortic atherosclerosis (ICD10-I70.0). Coronary artery calcification. 3. Enlarged pulmonic trunk, indicative of pulmonary arterial hypertension. 4.  Emphysema (ICD10-J43.9).     Electronically Signed  By: Newell Eke M.D.   On: 10/19/2023 15:55      PFT      No data to display             LAB RESULTS last 96 hours No results found.   ECHO 11/11/23   IMPRESSIONS     1. LV apical filling defect which is suspicious for LV thrombus in the  setting of apical hypokinesis. Left ventricular ejection fraction, by  estimation, is 60 to 65%. Left ventricular ejection fraction by 2D MOD  biplane is 64.7 %. The left ventricle has   normal function. The left ventricle demonstrates regional wall motion  abnormalities (see scoring diagram/findings for description). Left  ventricular diastolic parameters are consistent with Grade I diastolic  dysfunction (impaired relaxation). There is  severe hypokinesis of the left ventricular, apical apical segment.   2. Right ventricular systolic function is low normal. The right  ventricular size is normal. Tricuspid regurgitation signal is inadequate  for assessing PA pressure.   3. The mitral valve is grossly normal. Trivial mitral valve  regurgitation.   4. The aortic valve is tricuspid. Aortic valve regurgitation is not  visualized.   5. The inferior vena cava is normal in size with <50% respiratory  variability, suggesting right atrial pressure of 8 mmHg.   6. Agitated saline contrast  bubble study was negative, with no evidence  of any interatrial shunt.   Comparison(s): Changes from prior study are noted. 01/23/2020: LVEF 60-65%.  Although it was not noted, I reviewed the prior echo from 2021 and there  is an apical wall motion abnormality with what appears to be thrombus at  that time. The apical hypokinesis   dates back to 2018 when he underwent PCI of the distal LAD.   Conclusion(s)/Recommendation(s): Critical findings reported to Dr. Darlean  and acknowledged at 11/11/2023.          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   reports that he quit smoking about 24 years ago. His smoking use included cigarettes. He started smoking about 44 years ago. He has a 2 pack-year smoking history. He has never used smokeless tobacco.  Past Surgical History:  Procedure Laterality Date   ABDOMINAL SURGERY     colon polyp removed 2007- sepsis, coma for 4 months per pt   BIOPSY  12/24/2020   Procedure: BIOPSY;  Surgeon: Cindie Carlin POUR, DO;  Location: [facility];  Service: Endoscopy;;   CATARACT EXTRACTION W/PHACO Right 08/19/2021   Procedure: CATARACT EXTRACTION PHACO AND INTRAOCULAR LENS PLACEMENT (IOC);  Surgeon: Harrie Agent, MD;  Location: [facility] ORS;  Service: Ophthalmology;  Laterality: Right;  CDE: 7.02    CATARACT EXTRACTION W/PHACO Left 09/30/2021   Procedure: CATARACT EXTRACTION PHACO AND INTRAOCULAR LENS PLACEMENT (IOC);  Surgeon: Harrie Agent, MD;  Location: [facility] OR[facility];  Service: Ophthalmology;  Laterality: Left;  CDE 4.61   COLONOSCOPY  03/22/2010   RMR: 1. Normal rectum 2. Diminutive polyp at the mouth of the ileocecal valve   status post cold biopsy removal. Remaideer of the colonic mucosa and terminal ileum mucosa appeared normal.    COLONOSCOPY N/A 06/25/2015   Procedure: COLONOSCOPY;  Surgeon: Lamar CHRISTELLA Hollingshead,  MD;  Location: [facility];  Service: Endoscopy;  Laterality: N/A;  200-rescheduled 1/30 per Ginger    COLONOSCOPY WITH PROPOFOL  N/A 12/24/2020   Procedure: COLONOSCOPY WITH PROPOFOL ;  Surgeon: Cindie Carlin POUR, DO;  Location: [facility];  Service: Endoscopy;  Laterality: N/A;  ASA III / 8:00   CORONARY BALLOON ANGIOPLASTY N/A 04/24/2017   Procedure: CORONARY BALLOON ANGIOPLASTY;  Surgeon: Dann Candyce RAMAN, MD;  Location: [facility] INVASIVE CV LAB;  Service: Cardiovascular;  Laterality: N/A;   LEFT HEART CATH AND CORONARY ANGIOGRAPHY N/A 04/24/2017   Procedure: LEFT HEART CATH AND CORONARY ANGIOGRAPHY;  Surgeon: Dann Candyce RAMAN, MD;  Location: [facility] INVASIVE CV LAB;  Service: Cardiovascular;  Laterality: N/A;   LUMBAR LAMINECTOMY  06/02/2012   LUMBAR LAMINECTOMY/DECOMPRESSION MICRODISCECTOMY  06/02/2012   Procedure: LUMBAR LAMINECTOMY/DECOMPRESSION MICRODISCECTOMY 1 LEVEL;  Surgeon: Catalina CHRISTELLA Stains, MD;  Location: [facility] NEURO ORS;  Service: Neurosurgery;  Laterality: Bilateral;  Bilateral Lumbar five-sacral one Foraminotomy and microdiskectomy    POLYPECTOMY  12/24/2020   Procedure: POLYPECTOMY;  Surgeon: Cindie Carlin POUR, DO;  Location: [facility];  Service: Endoscopy;;   TOTAL KNEE ARTHROPLASTY Right 05/16/2022   Procedure: TOTAL KNEE ARTHROPLASTY;  Surgeon: Shari Sieving, MD;  Location: [facility] ORS;  Service: Orthopedics;  Laterality: Right;    Allergies  Allergen Reactions   Levaquin  [Levofloxacin ] Itching   Statins Other (See Comments)    Myalgias    Immunization History  Administered Date(s) Administered   Fluad Quad(high Dose 65+) 05/12/2022   Fluad Trivalent(High Dose 65+) 03/20/2023   Influenza Whole 02/24/2011, 02/24/2012   Influenza,inj,Quad PF,6+ Mos 03/12/2019   Influenza-Unspecified  03/26/2018   Moderna Sars-Covid-2 Vaccination 07/13/2019, 08/10/2019   PNEUMOCOCCAL CONJUGATE-20 09/03/2021   Pneumococcal Conjugate-13 01/18/2014   Zoster Recombinant(Shingrix) 01/18/2023    Family History  Problem Relation Age of Onset   Heart attack Mother    Diabetes Mother    Stroke Father    Heart attack Father    Arthritis Other    Colon cancer Sister 80       Estimated age of onset; just found out about it in 2015.   Neuropathy Neg Hx      Current Outpatient Medications:    amLODipine  (NORVASC ) 10 MG tablet, TAKE ONE TABLET BY MOUTH ONCE DAILY., Disp: 90 tablet, Rfl: 2   apixaban  (ELIQUIS ) 5 MG TABS tablet, Take 1 tablet (5 mg total) by mouth 2 (two) times daily. Has 30 day free voucher, Disp: 60 tablet, Rfl: 6   calcitRIOL  (ROCALTROL ) 0.25 MCG capsule, Take 0.25 mcg by mouth every Monday, Wednesday, and Friday., Disp: , Rfl:    Coenzyme Q10 100 MG TABS, Take 1 tablet by mouth daily., Disp: , Rfl:    Coenzyme Q10-Vitamin E 100-150 MG-UNIT CAPS, Take 1 capsule by mouth at bedtime., Disp: , Rfl:    ezetimibe  (ZETIA ) 10 MG tablet, Take 1 tablet (10 mg total) by mouth daily., Disp: 90 tablet, Rfl: 3   FARXIGA 5 MG TABS tablet, Take 5 mg by mouth daily., Disp: , Rfl:    furosemide  (LASIX ) 20 MG tablet, TAKE 1 TABLET BY MOUTH ONCE DAILY., Disp: 90 tablet, Rfl: 2   hydrALAZINE  (APRESOLINE ) 25 MG tablet, TAKE (1) TABLET BY MOUTH TWICE DAILY., Disp: 180 tablet, Rfl: 1   metoprolol  succinate (TOPROL -XL) 50 MG 24 hr tablet, TAKE (1) TABLET BY MOUTH ONCE DAILY. TAKE WITH OR IMMEDIATELY FOLLOWING A MEAL., Disp: 90 tablet, Rfl: 3   Misc Natural Products (OSTEO BI-FLEX ADV JOINT SHIELD) TABS,  Take 1 tablet by mouth daily., Disp: , Rfl:    Multiple Vitamins-Minerals (CENTRUM PO), Take 1 tablet by mouth every morning., Disp: , Rfl:    olmesartan  (BENICAR ) 5 MG tablet, Take 1 tablet (5 mg total) by mouth daily., Disp: 90 tablet, Rfl: 4   pregabalin  (LYRICA ) 200 MG capsule, TAKE ONE CAPSULE BY  MOUTH TWICE DAILY., Disp: 60 capsule, Rfl: 5   rosuvastatin  (CRESTOR ) 10 MG tablet, TAKE (1) TABLET BY MOUTH ONCE DAILY., Disp: 90 tablet, Rfl: 3   sodium bicarbonate  650 MG tablet, Take 650 mg by mouth 3 (three) times daily., Disp: , Rfl:    timolol  (TIMOPTIC ) 0.5 % ophthalmic solution, 1 drop 2 (two) times daily., Disp: , Rfl:    albuterol  (VENTOLIN  HFA) 108 (90 Base) MCG/ACT inhaler, Inhale 2 puffs into the lungs every 6 (six) hours as needed for wheezing or shortness of breath. (Patient not taking: Reported on 03/31/2024), Disp: 18 g, Rfl: 3   atropine  1 % ophthalmic solution, Place 1 drop into the right eye 3 (three) times daily. (Patient not taking: Reported on 03/31/2024), Disp: , Rfl:    latanoprost  (XALATAN ) 0.005 % ophthalmic solution, Place 1 drop into the left eye at bedtime. (Patient not taking: Reported on 03/31/2024), Disp: , Rfl:    tobramycin  (TOBREX ) 0.3 % ophthalmic solution, Place 1 drop into both eyes every 6 (six) hours. (Patient not taking: Reported on 03/31/2024), Disp: , Rfl:       Objective:   Vitals:   03/31/24 0915  BP: 122/64  Pulse: 80  Temp: 98.2 F (36.8 C)  TempSrc: Oral  SpO2: 94%  Weight: 226 lb 9.6 oz (102.8 kg)  Height: 5' 9.5 (1.765 m)    Estimated body mass index is 32.98 kg/m as calculated from the following:   Height as of this encounter: 5' 9.5 (1.765 m).   Weight as of this encounter: 226 lb 9.6 oz (102.8 kg).  @WEIGHTCHANGE @  [employer]   03/31/24 0915  Weight: 226 lb 9.6 oz (102.8 kg)     Physical Exam   General: No distress. Looks stable.  O2 at rest: YES Cane present: no Sitting in wheel chair: no Frail: no Obese: YES Neuro: Alert and Oriented x 3. GCS 15. Speech normal Psych: Pleasant Resp:  Barrel Chest - no.  Wheeze - no, Crackles - YES BASE, No overt respiratory distress CVS: Normal heart sounds. Murmurs - no Ext: Stigmata of Connective Tissue Disease - no HEENT: Normal upper airway. PEERL +. No post nasal  drip        Assessment/     Assessment & Plan DOE (dyspnea on exertion)  Chronic respiratory failure with hypoxia (HCC)  ILD (interstitial lung disease) (HCC)  Chronic obstructive pulmonary disease with emphysema, unspecified emphysema type (HCC)  Enlarged pulmonary artery (HCC)  Elevated brain natriuretic peptide (BNP) level  History of pulmonary embolism  Vaccine counseling  Flu vaccine need    PLAN Patient Instructions     ICD-10-CM   1. DOE (dyspnea on exertion)  R06.09 Antinuclear Antib (ANA)    Rheumatoid factor    Cyclic citrul peptide antibody, IgG    QuantiFERON-TB Gold Plus    Pulmonary function test    2. Chronic respiratory failure with hypoxia (HCC)  J96.11 Antinuclear Antib (ANA)    Rheumatoid factor    Cyclic citrul peptide antibody, IgG    QuantiFERON-TB Gold Plus    Pulmonary function test    3. ILD (interstitial lung disease) (HCC)  J84.9  Antinuclear Antib (ANA)    Rheumatoid factor    Cyclic citrul peptide antibody, IgG    QuantiFERON-TB Gold Plus    Pulmonary function test    4. 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 Elevated brain natriuretic peptide (BNP) level  R79.89 Antinuclear Antib (ANA)    Rheumatoid factor    Cyclic citrul peptide antibody, IgG    QuantiFERON-TB Gold Plus    Pulmonary function test    7. History of pulmonary embolism  Z86.711     8. Vaccine counseling  Z71.85     9. Encounter for Prevnar pneumococcal vaccination  Z23     10. Flu vaccine need  Z23        Need to understand ILD, emphysema better Need to rule out  pulmonary hypertension  Plan - do flu vaccine 03/31/2024  - For now continue oxygen  4 L nasal cannula - Do blood work for ANA, rheumatoid factor, CCP, ESR and QuantiFERON gold - Take ILD questionnaire with you home and bring to back next visit -Do full pulmonary function test - Recommend right heart catheterization [sent message to her cardiologist]  Follow-up - Return to see our nurse practitioner in 6-8 weeks but after completing all of the above     FOLLOWUP    Return for - Return to see our nurse practitioner in 6-8 weeks but after completing all of the above.    SIGNATURE    Dr. Dorethia Cave, M.D., F.C.C.P,  Pulmonary and Critical Care Medicine Staff Physician, [facility] Director - Interstitial Lung Disease  Program  Pulmonary Fibrosis [facility] [place], [place], [address]  Pager: [phone], If no answer or between  15:00h - 7:00h: call [phone] Telephone: [phone]  9:51 AM 03/31/2024   Moderate Complexity MDM OFFICE  2021 E/M guidelines, first released in 2021, with minor revisions added in 2023 and 2024 Must meet the requirements for 2 out of 3 dimensions to qualify.    Number and complexity of problems addressed Amount and/or complexity of data reviewed Risk of complications and/or morbidity  One or more chronic illness with mild exacerbation, OR progression, OR  side effects of treatment  Two or more stable chronic illnesses  One undiagnosed new problem with uncertain prognosis  One acute illness with systemic symptoms   One Acute complicated injury Must meet the requirements for 1 of 3 of the categories)  Category 1: Tests and documents, historian  Any combination of 3 of the following:  Assessment requiring an independent historian  Review of prior external note(s) from each unique source  Review of results of each unique test  Ordering of each unique  test    Category 2: Interpretation of tests   Independent interpretation of a test performed by another physician/other qualified health care professional (not separately reported)  Category 3: Discuss management/tests  Discussion of management or test interpretation with external physician/other qualified health care professional/appropriate source (not separately reported) Moderate risk of morbidity from additional diagnostic  testing or treatment Examples only:  Prescription drug management  Decision regarding minor surgery with identfied patient or procedure risk factors  Decision regarding elective major surgery without identified patient or procedure risk factors  Diagnosis or treatment significantly limited by social determinants of health             HIGh Complexity  OFFICE   2021 E/M guidelines, first released in 2021, with minor revisions added in 2023. Must meet the requirements for 2 out of 3 dimensions to qualify.    Number and complexity of problems addressed Amount and/or complexity of data reviewed Risk of complications and/or morbidity  Severe exacerbation of chronic illness  Acute or chronic illnesses that may pose a threat to life or bodily function, e.g., multiple trauma, acute MI, pulmonary embolus, severe respiratory distress, progressive rheumatoid arthritis, psychiatric illness with potential threat to self or others, peritonitis, acute renal failure, abrupt change in neurological status Must meet the requirements for 2 of 3 of the categories)  Category 1: Tests and documents, historian  Any combination of 3 of the following:  Assessment requiring an independent historian  Review of prior external note(s) from each unique source  Review of results of each unique test  Ordering of each unique test    Category 2: Interpretation of tests    Independent interpretation of a test performed by another physician/other qualified health care  professional (not separately reported)  Category 3: Discuss management/tests  Discussion of management or test interpretation with external physician/other qualified health care professional/appropriate source (not separately reported)  HIGH risk of morbidity from additional diagnostic testing or treatment Examples only:  Drug therapy requiring intensive monitoring for toxicity  Decision for elective major surgery with identified pateint or procedure risk factors  Decision regarding hospitalization or escalation of level of care  Decision for DNR or to de-escalate care   Parenteral controlled  substances            LEGEND - Independent interpretation involves the interpretation of a test for which there is a CPT code, and an interpretation or report is customary. When a review and interpretation of a test is performed and documented by the provider, but not separately reported (billed), then this would represent an independent interpretation. This report does not need to conform to the usual standards of a complete report of the test. This does not include interpretation of tests that do not have formal reports such as a complete blood count with differential and blood cultures. Examples would include reviewing a chest radiograph and documenting in the medical record an interpretation, but not separately reporting (billing) the interpretation of the chest radiograph.   An appropriate source includes professionals who are not health care professionals but may be involved in the management of the patient, such as a clinical research associate, upper officer, case manager or teacher, and does not include discussion with family or informal caregivers.    - SDOH: SDOH are the conditions in the environments where people are born, live, learn, work, play, worship, and age that affect a wide range of health, functioning, and quality-of-life outcomes and risks. (e.g., housing, food insecurity,  transportation, etc.). SDOH-related Z codes ranging from Z55-Z65 are the ICD-10-CM diagnosis codes used to document SDOH data Z55 - Problems related to education and literacy Z56 - Problems related to employment and unemployment Z57 - Occupational exposure to risk factors Z58 - Problems related to physical environment Z59 - Problems related to housing and economic circumstances [phone] - Problems related  to social environment [phone] - Problems related to upbringing [phone] - Other problems related to primary support group, including family circumstances Z27 - Problems related to certain psychosocial circumstances Z65 - Problems related to other psychosocial circumstances

## 2024-03-31 NOTE — Addendum Note (Signed)
 Addended by: GERONIMO AMEL on: 03/31/2024 09:51 AM   Modules accepted: Level of Service

## 2024-03-31 NOTE — Telephone Encounter (Signed)
 DEar Sheppard  I am recommending RHC. ARe you able to facilitate?  Thanks    SIGNATURE    Dr. Dorethia Cave, M.D., F.C.C.P,  Pulmonary and Critical Care Medicine Staff Physician, [facility] Center Director - Interstitial Lung Disease  Program  Pulmonary Fibrosis [facility] at [facility] [place], [place], [address]   Pager: [phone], If no answer  -> Check AMION or Try [phone] Telephone (clinical office): [phone] Telephone (research): [phone]  9:50 AM 03/31/2024

## 2024-03-31 NOTE — Addendum Note (Signed)
 Addended by: BURT EVERTT RAMAN on: 03/31/2024 11:29 AM   Modules accepted: Orders

## 2024-03-31 NOTE — Patient Instructions (Addendum)
 ICD-10-CM   1. DOE (dyspnea on exertion)  R06.09 Antinuclear Antib (ANA)    Rheumatoid factor    Cyclic citrul peptide antibody, IgG    QuantiFERON-TB Gold Plus    Pulmonary function test    2. Chronic respiratory failure with hypoxia (HCC)  J96.11 Antinuclear Antib (ANA)    Rheumatoid factor    Cyclic citrul peptide antibody, IgG    QuantiFERON-TB Gold Plus    Pulmonary function test    3. ILD (interstitial lung disease) (HCC)  J84.9 Antinuclear Antib (ANA)    Rheumatoid factor    Cyclic citrul peptide antibody, IgG    QuantiFERON-TB Gold Plus    Pulmonary function test    4. 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 Elevated brain natriuretic peptide (BNP) level  R79.89 Antinuclear Antib (ANA)    Rheumatoid factor    Cyclic citrul peptide antibody, IgG    QuantiFERON-TB Gold Plus    Pulmonary function test    7. History of pulmonary embolism  Z86.711     8. Vaccine counseling  Z71.85     9. Encounter for Prevnar pneumococcal vaccination  Z23     10. Flu vaccine need  Z23        Need to understand ILD, emphysema better Need to rule out pulmonary hypertension  Plan - do flu vaccine 03/31/2024  - For now continue oxygen  4 L nasal cannula - Do blood work for ANA, rheumatoid factor, CCP, ESR and QuantiFERON gold - Take ILD questionnaire with you home and bring to back next visit -Do full pulmonary function test - Recommend right heart catheterization [sent message to her cardiologist]  Follow-up - Return to see our nurse practitioner in 6-8 weeks but after completing all of the  above

## 2024-04-01 NOTE — Telephone Encounter (Signed)
 Will be standard of caer; not research

## 2024-04-04 ENCOUNTER — Other Ambulatory Visit: Payer: Self-pay

## 2024-04-04 LAB — QUANTIFERON-TB GOLD PLUS
Mitogen-NIL: 7.53 [IU]/mL
NIL: 0.01 [IU]/mL
QuantiFERON-TB Gold Plus: NEGATIVE
TB1-NIL: 0 [IU]/mL
TB2-NIL: 0 [IU]/mL

## 2024-04-04 LAB — ANA: Anti Nuclear Antibody (ANA): POSITIVE — AB

## 2024-04-04 LAB — ANTI-NUCLEAR AB-TITER (ANA TITER): ANA Titer 1: 1:320 {titer} — ABNORMAL HIGH

## 2024-04-04 LAB — CYCLIC CITRUL PEPTIDE ANTIBODY, IGG: Cyclic Citrullin Peptide Ab: <16 U

## 2024-04-04 LAB — RHEUMATOID FACTOR: Rheumatoid fact SerPl-aCnc: <10 [IU]/mL (ref <14)

## 2024-04-06 MED ORDER — METOPROLOL SUCCINATE ER 50 MG PO TB24
50.0000 mg | ORAL_TABLET | Freq: Every day | ORAL | 3 refills | Status: AC
Start: 2024-04-06 — End: ?

## 2024-04-14 ENCOUNTER — Other Ambulatory Visit: Payer: Self-pay | Admitting: Internal Medicine

## 2024-04-14 DIAGNOSIS — I10 Essential (primary) hypertension: Secondary | ICD-10-CM

## 2024-04-20 NOTE — Telephone Encounter (Signed)
 Left message to call back.

## 2024-04-25 NOTE — Telephone Encounter (Signed)
 Patient is returning a call from the office on 04/20/24.  Please call him back [phone].  Thanks.

## 2024-04-28 ENCOUNTER — Telehealth: Payer: Self-pay | Admitting: Internal Medicine

## 2024-04-28 ENCOUNTER — Ambulatory Visit

## 2024-04-28 NOTE — Telephone Encounter (Signed)
 Copied from CRM #[id number]. Topic: Referral - Question >> Apr 28, 2024 12:35 PM Kyle Patel wrote: Reason for CRM: Patient states he thought Dr. Geronimo submitted a referral for him to have catheterization done but he has not heard anything back about that and is under the impression that he needs this done before he comes back to see [name] in January.  Please call patient back to advise.

## 2024-05-05 ENCOUNTER — Telehealth: Payer: Self-pay

## 2024-05-05 NOTE — Telephone Encounter (Unsigned)
 Copied from CRM #[id number]. Topic: Referral - Question >> Apr 28, 2024 12:35 PM Corean SAUNDERS wrote: Reason for CRM: Patient states he thought Dr. Geronimo submitted a referral for him to have catheterization done but he has not heard anything back about that and is under the impression that he needs this done before he comes back to see [name] in January.  Please call patient back to advise. >> May 04, 2024  3:42 PM Rozanna G wrote: Pt is calling back about the right catheterization and would like a call back about this please, he concerned

## 2024-05-05 NOTE — Telephone Encounter (Signed)
 Dr Geronimo, please advise referral

## 2024-05-06 NOTE — Telephone Encounter (Signed)
 Well the cards team did try to reach out to him on 04/20/24. I am copying them on this  Thanks    SIGNATURE    Dr. Dorethia Cave, M.D., F.C.C.P,  Pulmonary and Critical Care Medicine Staff Physician, [facility] Center Director - Interstitial Lung Disease  Program  Pulmonary Fibrosis [facility] Network at [facility] [place], [place], [address]   Pager: [phone], If no answer  -> Check AMION or Try [phone] Telephone (clinical office): [phone] Telephone (research): [phone]  12:39 PM 05/06/2024

## 2024-05-16 ENCOUNTER — Encounter: Payer: Self-pay | Admitting: Internal Medicine

## 2024-05-16 ENCOUNTER — Ambulatory Visit: Admitting: Internal Medicine

## 2024-05-16 VITALS — BP 136/64 | HR 107 | Ht 69.5 in | Wt 227.0 lb

## 2024-05-16 DIAGNOSIS — I25110 Atherosclerotic heart disease of native coronary artery with unstable angina pectoris: Secondary | ICD-10-CM | POA: Diagnosis not present

## 2024-05-16 DIAGNOSIS — N1832 Chronic kidney disease, stage 3b: Secondary | ICD-10-CM

## 2024-05-16 DIAGNOSIS — J9611 Chronic respiratory failure with hypoxia: Secondary | ICD-10-CM | POA: Diagnosis not present

## 2024-05-16 DIAGNOSIS — I513 Intracardiac thrombosis, not elsewhere classified: Secondary | ICD-10-CM

## 2024-05-16 DIAGNOSIS — Z7984 Long term (current) use of oral hypoglycemic drugs: Secondary | ICD-10-CM

## 2024-05-16 DIAGNOSIS — I10 Essential (primary) hypertension: Secondary | ICD-10-CM | POA: Diagnosis not present

## 2024-05-16 DIAGNOSIS — E1122 Type 2 diabetes mellitus with diabetic chronic kidney disease: Secondary | ICD-10-CM

## 2024-05-16 DIAGNOSIS — E782 Mixed hyperlipidemia: Secondary | ICD-10-CM | POA: Diagnosis not present

## 2024-05-16 MED ORDER — HYDRALAZINE HCL 25 MG PO TABS: 25.0000 mg | ORAL_TABLET | Freq: Two times a day (BID) | ORAL | 1 refills | Status: AC

## 2024-05-16 NOTE — Assessment & Plan Note (Signed)
 Lab Results  Component Value Date   HGBA1C 6.5 (H) 01/18/2024    Well controlled On Farxiga 5 mg QD - for CKD as well Advised to follow diabetic diet On statin and ACEi F/u CMP and lipid panel Diabetic eye exam: Advised to follow up with Ophthalmology for diabetic eye exam

## 2024-05-16 NOTE — Assessment & Plan Note (Addendum)
 Likely from COPD, worse recently since last hospitalization Needs to continue using home O2 for now - POC/portable O2 has been ordered Followed by Pulmonology - last visit note reviewed, planned to get PFT, has recommended right heart cath

## 2024-05-16 NOTE — Assessment & Plan Note (Signed)
 Last BMP reviewed -shows stable GFR, around 35 now Followed by nephrologist On sodium bicarb and Lasix  On Olmesartan  5 mg QD On Farxiga 5 mg once daily for proteinuria

## 2024-05-16 NOTE — Assessment & Plan Note (Signed)
 On statin and Zetia  currently Lipid profile reviewed

## 2024-05-16 NOTE — Assessment & Plan Note (Signed)
 BP Readings from Last 1 Encounters:  05/16/24 136/64   Well-controlled with amlodipine  10 mg once daily,  Olmesartan  5 mg once daily, metoprolol  50 mg QD and hydralazine  25 mg BID Counseled for compliance with the medications Advised DASH diet and moderate exercise/walking as tolerated

## 2024-05-16 NOTE — Assessment & Plan Note (Signed)
 S/p stent placement On aspirin  and statin On Metoprolol  Denies chest pain currently

## 2024-05-16 NOTE — Assessment & Plan Note (Signed)
 On Eliquis  5 mg BID Followed by Cardiology

## 2024-05-16 NOTE — Progress Notes (Signed)
 "  Established Patient Office Visit  Subjective:  Patient ID: Kyle Pinkerton., male    DOB: [birth year]  Age: 78 y.o. MRN: [id number]  CC:  Chief Complaint  Patient presents with   Medical Management of Chronic Issues    Follow up    Hypertension   Diabetes    HPI Kyle Stanwood. is a 78 y.o. male with past medical history of CAD, HTN, CKD, peripheral neuropathy and HLD who presents for f/u of his chronic medical conditions.  Chronic respiratory failure: He was evaluated by Dr. Darlean and Dr Geronimo for exertional dyspnea.  He was admitted at [facility] in 02/25 for COPD exacerbation and multifocal pneumonia.  He has been oxygen  dependent since then, at 4 lpm at rest and 6 lpm upon ambulation currently.  Denies any fever or chills.  He was placed on Breztri  for COPD, but is currently using only Albuterol  PRN after Pulmonology evaluation.  HTN and CAD: He has had stent placed for history of NSTEMI in 2018.  He is on aspirin  and statin currently. BP is wnl today. Takes medications regularly. Patient denies headache, dizziness, chest pain, or palpitations.  CKD stage IIIb: Followed by Dr. Rachele.  His last BMP showed GFR of 35 in 05/25, which was stable compared to his baseline. He has seen Dr. Rachele in 07/25.  He currently denies any dysuria, hematuria, urinary hesitance or resistance.  Type II DM: He is currently on Farxiga 5 mg QD for CKD.  His HbA1c was 6.5 in 08/25.  He denies polyuria or polyphagia currently.  He has history of peripheral neuropathy, for which he takes Lyrica  200 mg twice daily.  He has chronic burning pain in his feet and also complains of intermittent weakness of the LE, but have improved since increasing dose of Lyrica .  Denies any recent fall.    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   Hyperlipidemia    Pulmonary embolism (HCC) 10/2011    Past Surgical History:  Procedure Laterality Date   ABDOMINAL SURGERY     colon polyp removed 2007- sepsis, coma for 4 months per pt   BIOPSY  12/24/2020   Procedure: BIOPSY;  Surgeon: Cindie Carlin POUR, DO;  Location: [facility];  Service: Endoscopy;;   CATARACT EXTRACTION W/PHACO Right 08/19/2021   Procedure: CATARACT EXTRACTION PHACO AND INTRAOCULAR LENS PLACEMENT (IOC);  Surgeon: Harrie Agent, MD;  Location: [facility] OR[facility];  Service: Ophthalmology;  Laterality: Right;  CDE: 7.02    CATARACT EXTRACTION W/PHACO Left 09/30/2021   Procedure: CATARACT EXTRACTION PHACO AND INTRAOCULAR LENS PLACEMENT (IOC);  Surgeon: Harrie Agent, MD;  Location: [facility];  Service: Ophthalmology;  Laterality: Left;  CDE 4.61   COLONOSCOPY  03/22/2010   RMR: 1. Normal rectum 2. Diminutive polyp at the mouth of the ileocecal valve  status post cold biopsy removal. Remaideer of the colonic mucosa and terminal ileum mucosa appeared normal.    COLONOSCOPY N/A 06/25/2015   Procedure: COLONOSCOPY;  Surgeon: Lamar CHRISTELLA Hollingshead, MD;  Location: [facility];  Service: Endoscopy;  Laterality: N/A;  200-rescheduled 1/30 per Ginger    COLONOSCOPY WITH PROPOFOL  N/A 12/24/2020   Procedure: COLONOSCOPY WITH PROPOFOL ;  Surgeon: Cindie Carlin POUR, DO;  Location: [facility];  Service: Endoscopy;  Laterality: N/A;  ASA III / 8:00   CORONARY BALLOON ANGIOPLASTY N/A 04/24/2017   Procedure: CORONARY BALLOON ANGIOPLASTY;  Surgeon: Dann Candyce RAMAN, MD;  Location: [facility];  Service: Cardiovascular;  Laterality: N/A;   LEFT HEART CATH AND CORONARY ANGIOGRAPHY N/A 04/24/2017   Procedure: LEFT HEART CATH AND CORONARY ANGIOGRAPHY;  Surgeon: Dann Candyce RAMAN, MD;  Location: [facility] INVASIVE CV LAB;  Service: Cardiovascular;  Laterality: N/A;   LUMBAR LAMINECTOMY  06/02/2012   LUMBAR LAMINECTOMY/DECOMPRESSION MICRODISCECTOMY  06/02/2012   Procedure: LUMBAR LAMINECTOMY/DECOMPRESSION MICRODISCECTOMY 1 LEVEL;  Surgeon: Catalina CHRISTELLA Stains, MD;  Location: [facility];  Service: Neurosurgery;  Laterality: Bilateral;  Bilateral Lumbar five-sacral one Foraminotomy and microdiskectomy    POLYPECTOMY  12/24/2020   Procedure: POLYPECTOMY;  Surgeon: Cindie Carlin POUR, DO;  [facility]: [facility];  Service: Endoscopy;;   TOTAL KNEE ARTHROPLASTY Right 05/16/2022   Procedure: TOTAL KNEE ARTHROPLASTY;  Surgeon: Shari Sieving, MD;  Location: [facility];  Service: Orthopedics;  Laterality: Right;    Family History  Problem Relation Age of Onset   Heart attack Mother    Diabetes Mother    Stroke Father    Heart attack Father    Arthritis Other    Colon cancer Sister 6       Estimated age of onset; just found out about it in 55.   Neuropathy Neg Hx     Social History   Socioeconomic History   Marital status: Married    Spouse name: Not on file   Number of children: 1   Years of education: Not on file   Highest education level: Not on file  Occupational History   Occupation: retired  Tobacco Use   Smoking status: Former    Current packs/day: 0.00    Average packs/day: 0.1 packs/day for 20.0 years (2.0 ttl pk-yrs)    Types: Cigarettes    Start date: 05/08/1979    Quit date: 05/08/1999    Years since quitting: 25.0   Smokeless tobacco: Never  Vaping Use   Vaping status: Never Used  Substance and Sexual Activity   Alcohol use: No   Drug use: No   Sexual activity: Yes  Other Topics Concern   Not on file  Social History Narrative   Patient is right handed.   Patient drinks caffeine occasionally   Social Drivers of Health   Tobacco Use: Medium Risk (05/16/2024)   Patient History    Smoking Tobacco Use: Former    Smokeless Tobacco Use:  Never    Passive Exposure: Not on file  Financial Resource Strain: Low Risk (10/29/2021)   Overall Financial Resource Strain (CARDIA)    Difficulty of Paying Living Expenses: Not hard at all  Food Insecurity: No Food Insecurity (07/15/2023)   Hunger Vital Sign    Worried About Running Out of Food in the Last Year: Never true    Ran Out of Food in the Last Year: Never true  Transportation Needs: No Transportation Needs (07/15/2023)   PRAPARE - Administrator, Civil Service (Medical): No    Lack of Transportation (Non-Medical): No  Physical Activity: Inactive (10/29/2021)   Exercise Vital Sign    Days of Exercise per Week: 0 days    Minutes of  Exercise per Session: 0 min  Stress: No Stress Concern Present (10/29/2021)   [employer] of Occupational Health - Occupational Stress Questionnaire    Feeling of Stress : Not at all  Social Connections: Socially Integrated (07/08/2023)   Social Connection and Isolation Panel    Frequency of Communication with Friends and Family: More than three times a week    Frequency of Social Gatherings with Friends and Family: More than three times a week    Attends Religious Services: More than 4 times per year    Active Member of [employer] or Organizations: Yes    Attends Banker Meetings: More than 4 times per year    Marital Status: Married  Catering Manager Violence: Not At Risk (07/15/2023)   Humiliation, Afraid, Rape, and Kick questionnaire    Fear of Current or Ex-Partner: No    Emotionally Abused: No    Physically Abused: No    Sexually Abused: No  Depression (PHQ2-9): Medium Risk (05/16/2024)   Depression (PHQ2-9)    PHQ-2 Score: 6  Alcohol Screen: Low Risk (10/29/2021)   Alcohol Screen    Last Alcohol Screening Score (AUDIT): 0  Housing: Low Risk (07/15/2023)   Housing Stability Vital Sign    Unable to Pay for Housing in the Last Year: No    Number of Times Moved in the Last Year: 0    Homeless in the Last Year: No   Utilities: Not At Risk (07/15/2023)   AHC Utilities    Threatened with loss of utilities: No  Health Literacy: Not on file    Outpatient Medications Prior to Visit  Medication Sig Dispense Refill   albuterol  (VENTOLIN  HFA) 108 (90 Base) MCG/ACT inhaler Inhale 2 puffs into the lungs every 6 (six) hours as needed for wheezing or shortness of breath. (Patient not taking: Reported on 03/31/2024) 18 g 3   amLODipine  (NORVASC ) 10 MG tablet TAKE ONE TABLET BY MOUTH ONCE DAILY. 90 tablet 2   apixaban  (ELIQUIS ) 5 MG TABS tablet Take 1 tablet (5 mg total) by mouth 2 (two) times daily. Has 30 day free voucher 60 tablet 6   atropine  1 % ophthalmic solution Place 1 drop into the right eye 3 (three) times daily. (Patient not taking: Reported on 03/31/2024)     calcitRIOL  (ROCALTROL ) 0.25 MCG capsule Take 0.25 mcg by mouth every Monday, Wednesday, and Friday.     Coenzyme Q10 100 MG TABS Take 1 tablet by mouth daily.     Coenzyme Q10-Vitamin E 100-150 MG-UNIT CAPS Take 1 capsule by mouth at bedtime.     ezetimibe  (ZETIA ) 10 MG tablet Take 1 tablet (10 mg total) by mouth daily. 90 tablet 3   FARXIGA 5 MG TABS tablet Take 5 mg by mouth daily.     furosemide  (LASIX ) 20 MG tablet TAKE 1 TABLET BY MOUTH ONCE DAILY. 90 tablet 2   latanoprost  (XALATAN ) 0.005 % ophthalmic solution Place 1 drop into the left eye at bedtime. (Patient not taking: Reported on 03/31/2024)     metoprolol  succinate (TOPROL -XL) 50 MG 24 hr tablet Take 1 tablet (50 mg total) by mouth daily. Take with or immediately following a meal. 90 tablet 3   Misc Natural Products (OSTEO BI-FLEX ADV JOINT SHIELD) TABS Take 1 tablet by mouth daily.     Multiple Vitamins-Minerals (CENTRUM PO) Take 1 tablet by mouth every morning.     olmesartan  (BENICAR ) 5 MG tablet Take 1 tablet (5 mg total) by mouth daily. 90 tablet 4  pregabalin  (LYRICA ) 200 MG capsule TAKE ONE CAPSULE BY MOUTH TWICE DAILY. 60 capsule 5   rosuvastatin  (CRESTOR ) 10 MG tablet TAKE (1)  TABLET BY MOUTH ONCE DAILY. 90 tablet 3   sodium bicarbonate  650 MG tablet Take 650 mg by mouth 3 (three) times daily.     timolol  (TIMOPTIC ) 0.5 % ophthalmic solution 1 drop 2 (two) times daily.     tobramycin  (TOBREX ) 0.3 % ophthalmic solution Place 1 drop into both eyes every 6 (six) hours. (Patient not taking: Reported on 03/31/2024)     hydrALAZINE  (APRESOLINE ) 25 MG tablet TAKE (1) TABLET BY MOUTH TWICE DAILY. 180 tablet 1   No facility-administered medications prior to visit.    Allergies  Allergen Reactions   Levaquin  [Levofloxacin ] Itching   Statins Other (See Comments)    Myalgias    ROS Review of Systems  Constitutional:  Positive for fatigue. Negative for chills and fever.  HENT:  Negative for congestion and sore throat.   Eyes:  Negative for pain and discharge.  Respiratory:  Positive for shortness of breath (Intermittent, upon exertion). Negative for cough.   Cardiovascular:  Negative for chest pain and palpitations.  Gastrointestinal:  Negative for diarrhea, nausea and vomiting.  Endocrine: Negative for polydipsia and polyuria.  Genitourinary:  Negative for dysuria and hematuria.  Musculoskeletal:  Negative for neck pain and neck stiffness.  Skin:  Negative for rash.  Neurological:  Positive for weakness and numbness. Negative for dizziness and headaches.  Psychiatric/Behavioral:  Negative for agitation and behavioral problems.       Objective:    Physical Exam Vitals reviewed.  Constitutional:      General: He is not in acute distress.    Appearance: He is not diaphoretic.  HENT:     Head: Normocephalic and atraumatic.     Nose: Nose normal.     Mouth/Throat:     Mouth: Mucous membranes are moist.  Eyes:     General: No scleral icterus.    Extraocular Movements: Extraocular movements intact.  Cardiovascular:     Rate and Rhythm: Normal rate and regular rhythm.     Heart sounds: Normal heart sounds. No murmur heard. Pulmonary:     Breath sounds: No  wheezing or rales.     Comments: On 4 l O2 in office Musculoskeletal:     Cervical back: Neck supple. No tenderness.     Right lower leg: Edema (Trace) present.     Left lower leg: Edema (Trace) present.  Skin:    General: Skin is warm.     Findings: No rash.  Neurological:     General: No focal deficit present.     Mental Status: He is alert and oriented to person, place, and time.     Sensory: Sensory deficit (B/l feet) present.     Motor: Weakness (4/5 in b/l LE) present.     Gait: Gait abnormal (slow).  Psychiatric:        Mood and Affect: Mood normal.        Behavior: Behavior normal.     BP 136/64 (BP Location: Left Arm)   Pulse (!) 107   Ht 5' 9.5 (1.765 m)   Wt 227 lb (103 kg)   SpO2 (!) 86%   BMI 33.04 kg/m  Wt Readings from Last 3 Encounters:  05/16/24 227 lb (103 kg)  03/31/24 226 lb 9.6 oz (102.8 kg)  02/02/24 228 lb 12.8 oz (103.8 kg)    Lab Results  Component Value Date  TSH 1.580 08/17/2023   Lab Results  Component Value Date   WBC 7.8 08/17/2023   HGB 12.8 (L) 08/17/2023   HCT 38.5 08/17/2023   MCV 91 08/17/2023   PLT 178 08/17/2023   Lab Results  Component Value Date   NA 145 (H) 09/28/2023   K 5.0 09/28/2023   CO2 25 09/28/2023   GLUCOSE 91 09/28/2023   BUN 43 (H) 09/28/2023   CREATININE 1.95 (H) 09/28/2023   BILITOT 0.9 07/08/2023   ALKPHOS 55 07/08/2023   AST 29 07/08/2023   ALT 21 07/08/2023   PROT 8.0 07/08/2023   ALBUMIN 3.6 07/08/2023   CALCIUM  9.6 09/28/2023   ANIONGAP 6 07/13/2023   EGFR 35 (L) 09/28/2023   Lab Results  Component Value Date   CHOL 155 02/12/2024   Lab Results  Component Value Date   HDL 52 02/12/2024   Lab Results  Component Value Date   LDLCALC 86 02/12/2024   Lab Results  Component Value Date   TRIG 81 02/12/2024   Lab Results  Component Value Date   CHOLHDL 3.0 02/12/2024   Lab Results  Component Value Date   HGBA1C 6.5 (H) 01/18/2024      Assessment & Plan:   Problem List Items  Addressed This Visit       Cardiovascular and Mediastinum   Hypertension   BP Readings from Last 1 Encounters:  05/16/24 136/64   Well-controlled with amlodipine  10 mg once daily,  Olmesartan  5 mg once daily, metoprolol  50 mg QD and hydralazine  25 mg BID Counseled for compliance with the medications Advised DASH diet and moderate exercise/walking as tolerated      Relevant Medications   hydrALAZINE  (APRESOLINE ) 25 MG tablet   CAD (coronary artery disease)   S/p stent placement On aspirin  and statin On Metoprolol  Denies chest pain currently      Relevant Medications   hydrALAZINE  (APRESOLINE ) 25 MG tablet   LV (left ventricular) mural thrombus   On Eliquis  5 mg BID Followed by Cardiology      Relevant Medications   hydrALAZINE  (APRESOLINE ) 25 MG tablet     Respiratory   Chronic respiratory failure with hypoxia (HCC)   Likely from COPD, worse recently since last hospitalization Needs to continue using home O2 for now - POC/portable O2 has been ordered Followed by Pulmonology - last visit note reviewed, planned to get PFT, has recommended right heart cath        Endocrine   Type 2 diabetes mellitus with stage 3b chronic kidney disease, without long-term current use of insulin  (HCC) - Primary   Lab Results  Component Value Date   HGBA1C 6.5 (H) 01/18/2024    Well controlled On Farxiga 5 mg QD - for CKD as well Advised to follow diabetic diet On statin and ACEi F/u CMP and lipid panel Diabetic eye exam: Advised to follow up with Ophthalmology for diabetic eye exam      Relevant Orders   Urine Microalbumin w/creat. ratio   Bayer DCA Hb A1c Waived     Genitourinary   CKD (chronic kidney disease) stage 3, GFR 30-59 ml/min (HCC)   Last BMP reviewed -shows stable GFR, around 35 now Followed by nephrologist On sodium bicarb and Lasix  On Olmesartan  5 mg QD On Farxiga 5 mg once daily for proteinuria        Other   Hyperlipidemia (Chronic)   On statin and  Zetia  currently Lipid profile reviewed      Relevant Medications  hydrALAZINE  (APRESOLINE ) 25 MG tablet     Meds ordered this encounter  Medications   hydrALAZINE  (APRESOLINE ) 25 MG tablet    Sig: Take 1 tablet (25 mg total) by mouth 2 (two) times daily.    Dispense:  180 tablet    Refill:  1    Follow-up: Return in about 4 months (around 09/14/2024) for DM and HTN.    Kyle MARLA Blanch, MD "

## 2024-05-16 NOTE — Patient Instructions (Signed)
 Please continue taking Hydralazine  as prescribed.  Please continue to take other medications as prescribed.  Please continue to follow low carb diet and ambulate as tolerated.

## 2024-05-18 ENCOUNTER — Ambulatory Visit: Payer: Self-pay | Admitting: Internal Medicine

## 2024-05-18 LAB — MICROALBUMIN / CREATININE URINE RATIO
Creatinine, Urine: 64 mg/dL
Microalb/Creat Ratio: 47 mg/g{creat} — ABNORMAL HIGH (ref 0–29)
Microalbumin, Urine: 30.3 ug/mL

## 2024-05-18 LAB — BAYER DCA HB A1C WAIVED: HB A1C (BAYER DCA - WAIVED): 6.4 % — ABNORMAL HIGH (ref 4.8–5.6)

## 2024-05-18 NOTE — H&P (View-Only) (Signed)
 ANA is positive but rest negative. Lookd for drug induced causes of ANA positive. And check myositis antibody panel.  Also needs RHC - still not done. And

## 2024-05-18 NOTE — Progress Notes (Signed)
 ANA is positive but rest negative. Lookd for drug induced causes of ANA positive. And check myositis antibody panel.  Also needs RHC - still not done. And

## 2024-05-20 ENCOUNTER — Encounter (HOSPITAL_COMMUNITY): Payer: Self-pay | Admitting: *Deleted

## 2024-05-20 ENCOUNTER — Other Ambulatory Visit (HOSPITAL_COMMUNITY): Payer: Self-pay | Admitting: *Deleted

## 2024-05-20 DIAGNOSIS — R0609 Other forms of dyspnea: Secondary | ICD-10-CM

## 2024-05-20 NOTE — Telephone Encounter (Signed)
 RHC sch for 05/31/24 with Dr Zenaida, orders placed, reviewed instructions with patient and wife via phone and copy mailed to them.

## 2024-05-31 ENCOUNTER — Other Ambulatory Visit: Payer: Self-pay

## 2024-05-31 ENCOUNTER — Ambulatory Visit (HOSPITAL_COMMUNITY)
Admission: RE | Admit: 2024-05-31 | Discharge: 2024-05-31 | Disposition: A | Discharge status: Home / Self Care | Attending: Cardiology | Admitting: Cardiology

## 2024-05-31 ENCOUNTER — Encounter (HOSPITAL_COMMUNITY)
Admission: RE | Disposition: A | Discharge status: Home / Self Care | Payer: Self-pay | Source: Home / Self Care | Attending: Cardiology

## 2024-05-31 DIAGNOSIS — E1122 Type 2 diabetes mellitus with diabetic chronic kidney disease: Secondary | ICD-10-CM | POA: Insufficient documentation

## 2024-05-31 DIAGNOSIS — Z79899 Other long term (current) drug therapy: Secondary | ICD-10-CM | POA: Diagnosis not present

## 2024-05-31 DIAGNOSIS — Z955 Presence of coronary angioplasty implant and graft: Secondary | ICD-10-CM | POA: Diagnosis not present

## 2024-05-31 DIAGNOSIS — Z87891 Personal history of nicotine dependence: Secondary | ICD-10-CM | POA: Diagnosis not present

## 2024-05-31 DIAGNOSIS — I129 Hypertensive chronic kidney disease with stage 1 through stage 4 chronic kidney disease, or unspecified chronic kidney disease: Secondary | ICD-10-CM | POA: Diagnosis not present

## 2024-05-31 DIAGNOSIS — N1832 Chronic kidney disease, stage 3b: Secondary | ICD-10-CM | POA: Diagnosis not present

## 2024-05-31 DIAGNOSIS — J9611 Chronic respiratory failure with hypoxia: Secondary | ICD-10-CM | POA: Diagnosis not present

## 2024-05-31 DIAGNOSIS — Z7982 Long term (current) use of aspirin: Secondary | ICD-10-CM | POA: Insufficient documentation

## 2024-05-31 DIAGNOSIS — E785 Hyperlipidemia, unspecified: Secondary | ICD-10-CM | POA: Diagnosis not present

## 2024-05-31 DIAGNOSIS — I272 Pulmonary hypertension, unspecified: Secondary | ICD-10-CM | POA: Diagnosis not present

## 2024-05-31 DIAGNOSIS — E782 Mixed hyperlipidemia: Secondary | ICD-10-CM | POA: Insufficient documentation

## 2024-05-31 DIAGNOSIS — R0602 Shortness of breath: Secondary | ICD-10-CM | POA: Diagnosis not present

## 2024-05-31 DIAGNOSIS — I2510 Atherosclerotic heart disease of native coronary artery without angina pectoris: Secondary | ICD-10-CM | POA: Insufficient documentation

## 2024-05-31 DIAGNOSIS — R0609 Other forms of dyspnea: Secondary | ICD-10-CM

## 2024-05-31 DIAGNOSIS — Z7984 Long term (current) use of oral hypoglycemic drugs: Secondary | ICD-10-CM | POA: Insufficient documentation

## 2024-05-31 HISTORY — PX: RIGHT HEART CATH: CATH118263

## 2024-05-31 LAB — POCT I-STAT EG7
Acid-Base Excess: 4 mmol/L — ABNORMAL HIGH (ref 0.0–2.0)
Acid-Base Excess: 4 mmol/L — ABNORMAL HIGH (ref 0.0–2.0)
Bicarbonate: 31.2 mmol/L — ABNORMAL HIGH (ref 20.0–28.0)
Bicarbonate: 31.4 mmol/L — ABNORMAL HIGH (ref 20.0–28.0)
Calcium, Ion: 1.25 mmol/L (ref 1.15–1.40)
Calcium, Ion: 1.25 mmol/L (ref 1.15–1.40)
HCT: 31 % — ABNORMAL LOW (ref 39.0–52.0)
HCT: 32 % — ABNORMAL LOW (ref 39.0–52.0)
Hemoglobin: 10.5 g/dL — ABNORMAL LOW (ref 13.0–17.0)
Hemoglobin: 10.9 g/dL — ABNORMAL LOW (ref 13.0–17.0)
O2 Saturation: 63 %
O2 Saturation: 64 %
Potassium: 4.8 mmol/L (ref 3.5–5.1)
Potassium: 4.9 mmol/L (ref 3.5–5.1)
Sodium: 142 mmol/L (ref 135–145)
Sodium: 142 mmol/L (ref 135–145)
TCO2: 33 mmol/L — ABNORMAL HIGH (ref 22–32)
TCO2: 33 mmol/L — ABNORMAL HIGH (ref 22–32)
pCO2, Ven: 62.8 mmHg — ABNORMAL HIGH (ref 44–60)
pCO2, Ven: 63.5 mmHg — ABNORMAL HIGH (ref 44–60)
pH, Ven: 7.303 (ref 7.25–7.43)
pH, Ven: 7.305 (ref 7.25–7.43)
pO2, Ven: 37 mmHg (ref 32–45)
pO2, Ven: 37 mmHg (ref 32–45)

## 2024-05-31 LAB — CBC
HCT: 33.9 % — ABNORMAL LOW (ref 39.0–52.0)
Hemoglobin: 10.3 g/dL — ABNORMAL LOW (ref 13.0–17.0)
MCH: 29.8 pg (ref 26.0–34.0)
MCHC: 30.4 g/dL (ref 30.0–36.0)
MCV: 98 fL (ref 80.0–100.0)
Platelets: 148 K/uL — ABNORMAL LOW (ref 150–400)
RBC: 3.46 MIL/uL — ABNORMAL LOW (ref 4.22–5.81)
RDW: 14.4 % (ref 11.5–15.5)
WBC: 8 K/uL (ref 4.0–10.5)
nRBC: 0 % (ref 0.0–0.2)

## 2024-05-31 LAB — GLUCOSE, CAPILLARY: Glucose-Capillary: 114 mg/dL — ABNORMAL HIGH (ref 70–99)

## 2024-05-31 LAB — BASIC METABOLIC PANEL WITH GFR
Anion gap: 8 (ref 5–15)
BUN: 48 mg/dL — ABNORMAL HIGH (ref 8–23)
CO2: 29 mmol/L (ref 22–32)
Calcium: 9 mg/dL (ref 8.9–10.3)
Chloride: 103 mmol/L (ref 98–111)
Creatinine, Ser: 2.32 mg/dL — ABNORMAL HIGH (ref 0.61–1.24)
GFR, Estimated: 28 mL/min — ABNORMAL LOW
Glucose, Bld: 113 mg/dL — ABNORMAL HIGH (ref 70–99)
Potassium: 5.4 mmol/L — ABNORMAL HIGH (ref 3.5–5.1)
Sodium: 140 mmol/L (ref 135–145)

## 2024-05-31 MED ORDER — HEPARIN (PORCINE) IN NACL 1000-0.9 UT/500ML-% IV SOLN
INTRAVENOUS | Status: DC | PRN
  Administered 2024-05-31: 500 mL

## 2024-05-31 MED ORDER — SODIUM CHLORIDE 0.9 % IV SOLN: 250.0000 mL | INTRAVENOUS | Status: DC | PRN

## 2024-05-31 MED ORDER — FREE WATER: 250.0000 mL | Freq: Once | Status: DC

## 2024-05-31 MED ORDER — LIDOCAINE HCL (PF) 1 % IJ SOLN
INTRAMUSCULAR | Status: AC
  Filled 2024-05-31: qty 30

## 2024-05-31 MED ORDER — SODIUM CHLORIDE 0.9% FLUSH: 3.0000 mL | INTRAVENOUS | Status: DC | PRN

## 2024-05-31 MED ORDER — SODIUM CHLORIDE 0.9% FLUSH: 3.0000 mL | Freq: Two times a day (BID) | INTRAVENOUS | Status: DC

## 2024-05-31 MED ORDER — LIDOCAINE HCL (PF) 1 % IJ SOLN
INTRAMUSCULAR | Status: DC | PRN
  Administered 2024-05-31: 2 mL via INTRADERMAL

## 2024-05-31 NOTE — Interval H&P Note (Signed)
 History and Physical Interval Note:  05/31/2024 10:34 AM  Kyle Patel.  has presented today for surgery, with the diagnosis of sob.  The various methods of treatment have been discussed with the patient and family. After consideration of risks, benefits and other options for treatment, the patient has consented to  Procedures: RIGHT HEART CATH (N/A) as a surgical intervention.  The patient's history has been reviewed, patient examined, no change in status, stable for surgery.  I have reviewed the patient's chart and labs.  Questions were answered to the patient's satisfaction.     [name]

## 2024-05-31 NOTE — Progress Notes (Addendum)
 Discharge instructions reviewed with patient and spouse at bedside. Denies questions concerns. Incision site remains clean dry and intact. No s/s of complications. PT escorted from the unit via wheel chair to personal vehicle.

## 2024-05-31 NOTE — Discharge Instructions (Signed)

## 2024-06-01 ENCOUNTER — Telehealth: Payer: Self-pay | Admitting: Primary Care

## 2024-06-01 ENCOUNTER — Ambulatory Visit: Admitting: Primary Care

## 2024-06-01 ENCOUNTER — Ambulatory Visit

## 2024-06-01 ENCOUNTER — Encounter (HOSPITAL_COMMUNITY): Payer: Self-pay | Admitting: Cardiology

## 2024-06-01 VITALS — BP 138/72 | HR 113 | Temp 93.0°F | Ht 68.0 in | Wt 227.0 lb

## 2024-06-01 DIAGNOSIS — I288 Other diseases of pulmonary vessels: Secondary | ICD-10-CM

## 2024-06-01 DIAGNOSIS — J9611 Chronic respiratory failure with hypoxia: Secondary | ICD-10-CM

## 2024-06-01 DIAGNOSIS — R7989 Other specified abnormal findings of blood chemistry: Secondary | ICD-10-CM

## 2024-06-01 DIAGNOSIS — Z87891 Personal history of nicotine dependence: Secondary | ICD-10-CM | POA: Diagnosis not present

## 2024-06-01 DIAGNOSIS — J439 Emphysema, unspecified: Secondary | ICD-10-CM

## 2024-06-01 DIAGNOSIS — J849 Interstitial pulmonary disease, unspecified: Secondary | ICD-10-CM

## 2024-06-01 DIAGNOSIS — R0609 Other forms of dyspnea: Secondary | ICD-10-CM

## 2024-06-01 DIAGNOSIS — J432 Centrilobular emphysema: Secondary | ICD-10-CM

## 2024-06-01 DIAGNOSIS — I272 Pulmonary hypertension, unspecified: Secondary | ICD-10-CM

## 2024-06-01 DIAGNOSIS — J449 Chronic obstructive pulmonary disease, unspecified: Secondary | ICD-10-CM

## 2024-06-01 LAB — PULMONARY FUNCTION TEST
DL/VA % pred: 69 %
DL/VA: 2.79 ml/min/mmHg/L
DLCO cor % pred: 34 %
DLCO cor: 8.21 ml/min/mmHg
DLCO unc % pred: 29 %
DLCO unc: 7 ml/min/mmHg
FEF 25-75 Post: 0.9 L/s
FEF 25-75 Pre: 0.93 L/s
FEF2575-%Change-Post: -2 %
FEF2575-%Pred-Post: 45 %
FEF2575-%Pred-Pre: 46 %
FEV1-%Change-Post: -3 %
FEV1-%Pred-Post: 56 %
FEV1-%Pred-Pre: 58 %
FEV1-Post: 1.57 L
FEV1-Pre: 1.62 L
FEV1FVC-%Change-Post: 12 %
FEV1FVC-%Pred-Pre: 103 %
FEV6-%Change-Post: -14 %
FEV6-%Pred-Post: 51 %
FEV6-%Pred-Pre: 60 %
FEV6-Post: 1.86 L
FEV6-Pre: 2.17 L
FEV6FVC-%Pred-Post: 107 %
FEV6FVC-%Pred-Pre: 107 %
FVC-%Change-Post: -14 %
FVC-%Pred-Post: 48 %
FVC-%Pred-Pre: 56 %
FVC-Post: 1.86 L
FVC-Pre: 2.17 L
Post FEV1/FVC ratio: 84 %
Post FEV6/FVC ratio: 100 %
Pre FEV1/FVC ratio: 75 %
Pre FEV6/FVC Ratio: 100 %
RV % pred: 64 %
RV: 1.58 L
TLC % pred: 61 %
TLC: 4.09 L

## 2024-06-01 NOTE — Telephone Encounter (Signed)
 Triage- Please let patient know I spoke with Dr. Geronimo, hydralazine  can potentially lead to drug induced lupus. But 50-60% patient can have positive ANA without necessarily having full blown lupus. We need him to monitor and reports any fevers, rash, joint pain and unintentional weight loss. He needs another lab test called. I'm sorry they can not be added on from today's visit. This is specifically checking for lupus. He can have labs drawn any time this week or next.   Cardiology recommended gentle diuresis for pulmonary HTN He should be taking lasix  20mg  daily, please confirm that he is taking diuretic

## 2024-06-01 NOTE — Telephone Encounter (Signed)
 ILD patient - ANA was positive 1:320, the rest of his serology was negative. He is on hydralazine , could this affect that blood test- is it ok to cont? 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

## 2024-06-01 NOTE — Progress Notes (Signed)
 Full PFT performed today.

## 2024-06-01 NOTE — Telephone Encounter (Signed)
 Good catch:   Per AI< Yes, hydralazine  is a well-known cause of positive antinuclear antibody (ANA) tests, often leading to drug-induced lupus (DIL), where up to 50-60% of patients on hydralazine  can develop a positive ANA without necessarily having full-blown lupus, but it can also trigger true lupus symptoms, so monitoring symptoms is key, as positive ANA alone isn't enough for diagnosis. A positive ANA during hydralazine  treatment is a common lab finding, but clinicians look for symptoms like fever, joint pain, or weight loss, and check for anti-histone antibodies to confirm DIL, not just a false positive.    2) I think cards said start lasix  which  you may

## 2024-06-01 NOTE — Progress Notes (Signed)
 "  @Patient  ID: Kyle VEAR Clayton Mickey., male    DOB: 1946-05-01, 79 y.o.   MRN: [id number]  Chief Complaint  Patient presents with   Interstitial Lung Disease    Pft f/u    Referring provider: Tobie Suzzane POUR, MD  HPI: 89 year, never smoked. PMH significant for CAD, HTN, NSTEMI, COPD, chronic respiratory failure, type 2 diabetes, CKD, hyperlipidemia.   06/01/2024 Discussed the use of AI scribe software for clinical note transcription with the patient, who gave verbal consent to proceed.  History of Present Illness Kyle Mankey. is a 79 year old male with interstitial lung disease, COPD with emphysema, and pulmonary hypertension who presents for a follow-up visit.  He recently underwent a breathing test and a heart catheterization. He uses oxygen  at four liters per minute since November.  He experiences shortness of breath during normal activities such as sitting, household chores, and climbing stairs. He has a cough two to three times a day, which is clear in color.  He underwent a heart catheterization yesterday. He is not currently on any medication for lung disease.  His past medical history includes a history of a blood clot and an elevated ANA, possibly related to hydralazine  use. He has not had a sleep study and does not use CPAP at night. No waking up gasping or choking and does not believe he has sleep apnea.  He is a retired psychologist, occupational, having retired twelve years ago, and did not use a mask during his work, which may have contributed to his lung condition.   Pulmonary function testings 06/01/2024 >> FVC 1.86 (48%), FEV1 1.57 (56%), ratio 84, DLCOcor 8.21 (43%)  Allergies[1]  Immunization History  Administered Date(s) Administered    sv, Bivalent, Protein Subunit Rsvpref,pf Marlow) 04/18/2024   Fluad Quad(high Dose 65+) 05/12/2022   Fluad Trivalent(High Dose 65+) 03/20/2023   INFLUENZA, HIGH DOSE SEASONAL PF 03/31/2024   Influenza Whole 02/24/2011, 02/24/2012    Influenza,inj,Quad PF,6+ Mos 03/12/2019   Influenza-Unspecified 03/26/2018   Moderna Sars-Covid-2 Vaccination 07/13/2019, 08/10/2019   PNEUMOCOCCAL CONJUGATE-20 09/03/2021   Pneumococcal Conjugate-13 01/18/2014   Tdap 04/18/2024   Zoster Recombinant(Shingrix) 01/18/2023, 03/26/2023    Past Medical History:  Diagnosis Date   Acute anterior wall MI (HCC) 04/24/2017   Arthritis    AVN of femur (HCC)    CAD (coronary artery disease)    a. 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 18 g 3   amLODipine  (NORVASC ) 10 MG tablet TAKE ONE TABLET BY MOUTH ONCE DAILY. 90 tablet 2   apixaban  (ELIQUIS ) 5 MG TABS tablet Take 1 tablet (5 mg total) by mouth 2 (two) times daily. Has 30 day free voucher 60 tablet 6   atropine  1 % ophthalmic solution Place 1 drop into both eyes 2 (two) times daily.     calcitRIOL  (ROCALTROL ) 0.25 MCG capsule Take 0.25 mcg by mouth every Monday, Wednesday, and  Friday.     Coenzyme Q10-Vitamin E 100-150 MG-UNIT CAPS Take 1 capsule by mouth at bedtime.     dorzolamide (TRUSOPT) 2 % ophthalmic solution Place 1 drop into both eyes 2 (two) times daily.     ezetimibe  (ZETIA ) 10 MG tablet Take 1 tablet (10 mg total) by mouth daily. 90 tablet 3   FARXIGA 5 MG TABS tablet Take 5 mg by mouth daily.     furosemide  (LASIX ) 20 MG tablet TAKE 1 TABLET BY MOUTH ONCE DAILY. 90 tablet 2   hydrALAZINE  (APRESOLINE ) 25  MG tablet Take 1 tablet (25 mg total) by mouth 2 (two) times daily. 180 tablet 1   latanoprost  (XALATAN ) 0.005 % ophthalmic solution Place 1 drop into both eyes at bedtime.     metoprolol  succinate (TOPROL -XL) 50 MG 24 hr tablet Take 1 tablet (50 mg total) by mouth daily. Take with or immediately following a meal. 90 tablet 3   Misc Natural Products (OSTEO BI-FLEX ADV JOINT SHIELD) TABS Take 1 tablet by mouth daily.     Multiple Vitamins-Minerals (CENTRUM PO) Take 1 tablet by mouth every morning.     olmesartan  (BENICAR ) 5 MG tablet Take 1 tablet (5 mg total) by mouth daily. 90 tablet 4   pregabalin  (LYRICA ) 200 MG capsule TAKE ONE CAPSULE BY MOUTH TWICE DAILY. 60 capsule 5   rosuvastatin  (CRESTOR ) 10 MG tablet TAKE (1) TABLET BY MOUTH ONCE DAILY. 90 tablet 3   sodium bicarbonate  650 MG tablet Take 650 mg by mouth 3 (three) times daily.     timolol  (TIMOPTIC ) 0.5 % ophthalmic solution Place 1 drop into both eyes 2 (two) times daily.     tobramycin  (TOBREX ) 0.3 % ophthalmic solution Place 1 drop into both eyes every 6 (six) hours. (Patient not taking: Reported on 02/02/2024)     No facility-administered medications prior to visit.   Review of Systems  Review of Systems  Constitutional: Negative.   Respiratory:  Positive for cough.        DOE  Cardiovascular: Negative.    Physical Exam  BP 138/72   Pulse (!) 113   Temp (!) 93 F (33.9 C)   Ht 5' 8 (1.727 m)   Wt 227 lb (103 kg)   SpO2 95% Comment: 6l o2 cont, resting, after sit stand  BMI 34.52 kg/m  Physical Exam Constitutional:      Appearance: Normal appearance.  HENT:     Head: Normocephalic and atraumatic.  Cardiovascular:     Rate and Rhythm: Normal rate and regular rhythm.  Pulmonary:     Effort: No respiratory distress.     Breath sounds: Wheezing present. No rhonchi or rales.     Comments: 4L oxygen   Neurological:     General: No focal deficit present.     Mental Status: He is alert and oriented to person, place,  and time. Mental status is at baseline.  Psychiatric:        Mood and Affect: Mood normal.        Behavior: Behavior normal.        Thought Content: Thought content normal.        Judgment: Judgment normal.      Lab Results:  CBC    Component Value Date/Time   WBC 8.0 05/31/2024 0821   RBC 3.46 (L) 05/31/2024 0821   HGB 10.9 (L) 05/31/2024 1047   HGB 12.8 (L) 08/17/2023 1641   HCT 32.0 (L) 05/31/2024 1047   HCT 38.5 08/17/2023 1641  PLT 148 (L) 05/31/2024 0821   PLT 178 08/17/2023 1641   MCV 98.0 05/31/2024 0821   MCV 91 08/17/2023 1641   MCH 29.8 05/31/2024 0821   MCHC 30.4 05/31/2024 0821   RDW 14.4 05/31/2024 0821   RDW 13.7 08/17/2023 1641   LYMPHSABS 1.2 08/17/2023 1641   MONOABS 1.2 (H) 07/08/2023 1226   EOSABS 0.2 08/17/2023 1641   BASOSABS 0.0 08/17/2023 1641    BMET    Component Value Date/Time   NA 142 05/31/2024 1047   NA 145 (H) 09/28/2023 1651   K 4.8 05/31/2024 1047   CL 103 05/31/2024 0821   CO2 29 05/31/2024 0821   GLUCOSE 113 (H) 05/31/2024 0821   BUN 48 (H) 05/31/2024 0821   BUN 43 (H) 09/28/2023 1651   CREATININE 2.32 (H) 05/31/2024 0821   CREATININE 1.77 (H) 08/08/2019 1238   CALCIUM  9.0 05/31/2024 0821   GFRNONAA 28 (L) 05/31/2024 0821   GFRNONAA 38 (L) 08/08/2019 1238   GFRAA 44 (L) 08/08/2019 1238    BNP    Component Value Date/Time   BNP 139.0 (H) 09/28/2023 1651   BNP 258.0 (H) 07/08/2023 1226    ProBNP    Component Value Date/Time   PROBNP 603.7 (H) 11/10/2011 2052    Imaging: CARDIAC CATHETERIZATION Result Date: 06/01/2024 HEMODYNAMICS: RA:       10 mmHg (mean) RV:       60/3, 10 mmHg PA:       60/22 mmHg (34 mean) PCWP: 15 mmHg (mean)    Estimated Fick CO/CI   6.15L/min, 2.79L/min/m2    TPG  19  mmHg     PVR  3.1 Wood Units PAPi  3.8  IMPRESSION: Right heart catheterization for gold standard evaluation of pulmonary hypertension Mildly elevated biventricular filling pressures Significant respiratory variation due to body  habitus 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  Given elevated PVR and borderline wedge suspect primarily group 3 with element of group 2 RECOMMENDATIONS: Results communicated to primary pulmonologist Would benefit from gentle diuresis as well as weight loss     Assessment & Plan:   1. Chronic obstructive pulmonary disease, unspecified COPD type (HCC) (Primary)  2. Chronic respiratory failure with hypoxia (HCC)  Assessment and Plan Assessment & Plan Chronic respiratory failure with hypoxia Chronic respiratory failure with hypoxia, requiring continuous oxygen  therapy. Oxygen  saturation drops significantly with exertion, indicating the necessity of supplemental oxygen  during physical activity and at night. Pulmonary rehabilitation may improve oxygen  tolerance and reduce dependency on oxygen  therapy. - Continue oxygen  therapy at 4 liters during exertion and at night. - Referred to pulmonary rehabilitation program to improve oxygen  tolerance and reduce dependency on oxygen  therapy.  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 Restricted lung function with reduced diffusion capacity (29%). No current medication for lung disease. Pulmonary rehabilitation may improve lung function and reduce oxygen  dependency. - Ordered myositis antibody panel to investigate potential autoimmune causes. - Will consider pulmonary rehabilitation to improve lung function and reduce oxygen  dependency. - Will monitor lung function with breathing tests every three months. - Will plan for follow-up CT scan of the chest to assess progression of fibrosis.  Chronic obstructive pulmonary disease with emphysema Chronic obstructive pulmonary disease with emphysema, confirmed by previous CT scan. No current medication for COPD. Pulmonary rehabilitation may improve respiratory status and reduce oxygen   dependency. - Referred to pulmonary rehabilitation program to improve respiratory status and reduce  oxygen  dependency.  Pulmonary hypertension Elevated pulmonary artery pressure, suspected to be group 3 with an element of group 2 pulmonary hypertension. Recent heart catheterization confirmed elevated pressures. No current medication for pulmonary hypertension. Pulmonary rehabilitation may improve respiratory status and reduce oxygen  dependency. - Will discuss potential use of vasodilators with Dr. Geronimo. - Continue oxygen  therapy to prevent exacerbation of pulmonary hypertension. - Will consider pulmonary rehabilitation to improve respiratory status and reduce oxygen  dependency. - Continue lasix  20mg  daily   Recording duration: 18 minutes   Almarie LELON Ferrari, NP 06/01/2024     [1]  Allergies Allergen Reactions   Levaquin  [Levofloxacin ] Itching   Statins Other (See Comments)    Myalgias  [2]  Social History Tobacco Use  Smoking Status Former   Current packs/day: 0.00   Average packs/day: 0.1 packs/day for 20.0 years (2.0 ttl pk-yrs)   Types: Cigarettes   Start date: 05/08/1979   Quit date: 05/08/1999   Years since quitting: 25.0  Smokeless Tobacco Never   "

## 2024-06-01 NOTE — Patient Instructions (Addendum)
" °  VISIT SUMMARY: You came in today for a follow-up visit regarding your chronic respiratory conditions, including interstitial lung disease, COPD with emphysema, and pulmonary hypertension. We discussed your recent breathing test and heart catheterization results, and your ongoing use of oxygen  therapy. You reported experiencing shortness of breath during daily activities and a clear-colored cough two to three times a day. 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  YOUR PLAN: -CHRONIC RESPIRATORY FAILURE WITH HYPOXIA: Chronic respiratory failure with hypoxia means your body is not getting enough oxygen , requiring continuous oxygen  therapy. Your oxygen  levels drop significantly with physical activity, so you need supplemental oxygen  during exertion and at night. We are referring you to a pulmonary rehabilitation program to help improve your oxygen  tolerance and potentially reduce your dependency on oxygen  therapy.  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 This condition restricts your lung function. We have ordered a myositis antibody panel to check for autoimmune causes and will monitor your lung function with breathing tests every three months. A follow-up CT scan of your chest will be planned to assess the progression of the fibrosis. Pulmonary rehabilitation may help improve your lung function and reduce your need for oxygen .  Please bring back ILD packet  -CHRONIC OBSTRUCTIVE PULMONARY DISEASE WITH EMPHYSEMA: Chronic obstructive pulmonary disease (COPD) with emphysema is a condition where the air sacs in your lungs are damaged, making it hard to breathe. We are referring you to a pulmonary rehabilitation program to help improve your respiratory status and  reduce your dependency on oxygen  therapy.  -PULMONARY HYPERTENSION: Pulmonary hypertension is high blood pressure in the arteries of your lungs. Your recent heart catheterization confirmed elevated pressures. We will discuss the potential use of vasodilators with Dr. Geronimo. Continuing oxygen  therapy is important to prevent worsening of this condition. Pulmonary rehabilitation may help improve your respiratory status and reduce your dependency on oxygen  therapy.  INSTRUCTIONS: Please continue using your oxygen  therapy at 4 liters per minute during exertion and at night. Attend the pulmonary rehabilitation program as referred. We will monitor your lung function with breathing tests every three months and plan for a follow-up CT scan of your chest. We will also discuss the potential use of vasodilators with Dr. Geronimo. Follow up with us  as scheduled for ongoing management of your conditions.   Follow-up 6=8 weeks with Dr. Herbie   "

## 2024-06-01 NOTE — Patient Instructions (Signed)
 Full PFT performed today.

## 2024-06-02 NOTE — Telephone Encounter (Signed)
ATC x1.  LVM to return call. 

## 2024-06-03 ENCOUNTER — Other Ambulatory Visit (HOSPITAL_COMMUNITY): Payer: Self-pay

## 2024-06-03 ENCOUNTER — Ambulatory Visit

## 2024-06-03 DIAGNOSIS — J449 Chronic obstructive pulmonary disease, unspecified: Secondary | ICD-10-CM

## 2024-06-03 DIAGNOSIS — J432 Centrilobular emphysema: Secondary | ICD-10-CM

## 2024-06-03 DIAGNOSIS — J849 Interstitial pulmonary disease, unspecified: Secondary | ICD-10-CM

## 2024-06-03 NOTE — Telephone Encounter (Signed)
 ATC x2.  No answer.  LVM to return call.  No mychart.

## 2024-06-06 ENCOUNTER — Encounter (HOSPITAL_COMMUNITY)
Admission: RE | Admit: 2024-06-06 | Discharge: 2024-06-06 | Disposition: A | Discharge status: Home / Self Care | Source: Ambulatory Visit | Attending: Internal Medicine | Admitting: Internal Medicine

## 2024-06-06 DIAGNOSIS — J432 Centrilobular emphysema: Secondary | ICD-10-CM | POA: Insufficient documentation

## 2024-06-06 DIAGNOSIS — J849 Interstitial pulmonary disease, unspecified: Secondary | ICD-10-CM | POA: Insufficient documentation

## 2024-06-06 DIAGNOSIS — J449 Chronic obstructive pulmonary disease, unspecified: Secondary | ICD-10-CM | POA: Insufficient documentation

## 2024-06-06 NOTE — Progress Notes (Signed)
 Component     Latest Ref Rng 06/01/2024  FVC-Pre     L 2.17   FVC-%Pred-Pre     % 56   FVC-Post     L 1.86   FVC-%Pred-Post     % 48   FVC-%Change-Post     % -14   FEV1-Pre     L 1.62   FEV1-%Pred-Pre     % 58   FEV1-Post     L 1.57   FEV1-%Pred-Post     % 56   FEV1-%Change-Post     % -3   FEV6-Pre     L 2.17   FEV6-%Pred-Pre     % 60   FEV6-Post     L 1.86   FEV6-%Pred-Post     % 51   FEV6-%Change-Post     % -14   Pre FEV1/FVC ratio     % 75   FEV1FVC-%Pred-Pre     % 103   Post FEV1/FVC ratio     % 84   FEV1FVC-%Change-Post     % 12   Pre FEV6/FVC Ratio     % 100   FEV6FVC-%Pred-Pre     % 107   Post FEV6/FVC ratio     % 100   FEV6FVC-%Pred-Post     % 107   FEF 25-75 Pre     L/sec 0.93   FEF2575-%Pred-Pre     % 46   FEF 25-75 Post     L/sec 0.90   FEF2575-%Pred-Post     % 45   FEF2575-%Change-Post     % -2   RV     L 1.58   RV % pred     % 64   TLC     L 4.09   TLC % pred     % 61   DLCO unc     ml/min/mmHg 7.00   DLCO unc % pred     % 29   DLCO cor     ml/min/mmHg 8.21   DLCO cor % pred     % 34   DL/VA     ml/min/mmHg/L 7.20   DL/VA % pred     % 69     PFT Results.

## 2024-06-06 NOTE — Progress Notes (Signed)
 Completed virtual orientation today.  EP evaluation is scheduled for 05/07/25 at 0830 .  Documentation for diagnosis can be found in [facility] encounter 06/01/24.

## 2024-06-06 NOTE — Telephone Encounter (Signed)
 Copied from CRM [id number]. Topic: General - Other >> Jun 03, 2024  9:14 AM Benton KIDD wrote: Reason for CRM: patient returning call he received from nurse Micky Overturf please give patient a call back [phone]  ---------------------------------------------------------------------------------------------------------------------------------------------  ATC x1.  LVM to return call.

## 2024-06-07 ENCOUNTER — Encounter (HOSPITAL_COMMUNITY): Admission: RE | Admit: 2024-06-07 | Discharge: 2024-06-07 | Attending: Internal Medicine

## 2024-06-07 VITALS — Ht 69.5 in | Wt 224.1 lb

## 2024-06-07 DIAGNOSIS — J449 Chronic obstructive pulmonary disease, unspecified: Secondary | ICD-10-CM

## 2024-06-07 DIAGNOSIS — J432 Centrilobular emphysema: Secondary | ICD-10-CM

## 2024-06-07 DIAGNOSIS — J849 Interstitial pulmonary disease, unspecified: Secondary | ICD-10-CM

## 2024-06-07 NOTE — Patient Instructions (Signed)
 Patient Instructions  Patient Details  Name: Kyle Patel. MRN: [id number] Date of Birth: 16-Jun-1945 Referring Provider:  Geronimo Amel, MD  Below are your personal goals for exercise, nutrition, and risk factors. Our goal is to help you stay on track towards obtaining and maintaining these goals. We will be discussing your progress on these goals with you throughout the program.  Initial Exercise Prescription:  Initial Exercise Prescription - 06/07/24 1200       Date of Initial Exercise RX and Referring Provider   Date 06/07/24    Referring Provider Geronimo Amel MD      Oxygen    Oxygen  Continuous    Liters 5    Maintain Oxygen  Saturation 88% or higher      NuStep   Level 1    SPM 80    Minutes 15    METs 1.5      Arm Ergometer   Level 1    RPM 25    Minutes 15    METs 1.5      Prescription Details   Frequency (times per week) 3    Duration Progress to 30 minutes of continuous aerobic without signs/symptoms of physical distress      Intensity   THRR 40-80% of Max Heartrate 100-129    Ratings of Perceived Exertion 11-13    Perceived Dyspnea 0-4      Progression   Progression Continue to progress workloads to maintain intensity without signs/symptoms of physical distress.      Resistance Training   Training Prescription Yes    Weight 4 lb    Reps 10-15          Exercise Goals: Frequency: Be able to perform aerobic exercise two to three times per week in program working toward 2-5 days per week of home exercise.  Intensity: Work with a perceived exertion of 11 (fairly light) - 15 (hard) while following your exercise prescription.  We will make changes to your prescription with you as you progress through the program.   Duration: Be able to do 30 to 45 minutes of continuous aerobic exercise in addition to a 5 minute warm-up and a 5 minute cool-down routine.   Nutrition Goals: Your personal nutrition goals will be established when you do your  nutrition analysis with the dietician.  The following are general nutrition guidelines to follow: Cholesterol < 200mg /day Sodium < 1500mg /day Fiber: Men over 50 yrs - 30 grams per day  Personal Goals:  Personal Goals and Risk Factors at Admission - 06/07/24 1236       Core Components/Risk Factors/Patient Goals on Admission    Weight Management Yes;Obesity;Weight Loss    Intervention Weight Management: Develop a combined nutrition and exercise program designed to reach desired caloric intake, while maintaining appropriate intake of nutrient and fiber, sodium and fats, and appropriate energy expenditure required for the weight goal.;Weight Management: Provide education and appropriate resources to help participant work on and attain dietary goals.;Weight Management/Obesity: Establish reasonable short term and long term weight goals.;Obesity: Provide education and appropriate resources to help participant work on and attain dietary goals.    Admit Weight 224 lb 1.6 oz (101.7 kg)    Goal Weight: Short Term 220 lb (99.8 kg)    Goal Weight: Long Term 215 lb (97.5 kg)    Expected Outcomes Short Term: Continue to assess and modify interventions until short term weight is achieved;Long Term: Adherence to nutrition and physical activity/exercise program aimed toward attainment of established weight goal;Weight  Loss: Understanding of general recommendations for a balanced deficit meal plan, which promotes 1-2 lb weight loss per week and includes a negative energy balance of [phone] kcal/d;Understanding recommendations for meals to include 15-35% energy as protein, 25-35% energy from fat, 35-60% energy from carbohydrates, less than 200mg  of dietary cholesterol, 20-35 gm of total fiber daily;Understanding of distribution of calorie intake throughout the day with the consumption of 4-5 meals/snacks    Improve shortness of breath with ADL's Yes    Intervention Provide education, individualized exercise plan and  daily activity instruction to help decrease symptoms of SOB with activities of daily living.    Expected Outcomes Short Term: Improve cardiorespiratory fitness to achieve a reduction of symptoms when performing ADLs;Long Term: Be able to perform more ADLs without symptoms or delay the onset of symptoms    Increase knowledge of respiratory medications and ability to use respiratory devices properly  Yes    Intervention Provide education and demonstration as needed of appropriate use of medications, inhalers, and oxygen  therapy.    Expected Outcomes Short Term: Achieves understanding of medications use. Understands that oxygen  is a medication prescribed by physician. Demonstrates appropriate use of inhaler and oxygen  therapy.;Long Term: Maintain appropriate use of medications, inhalers, and oxygen  therapy.    Hypertension Yes    Intervention Provide education on lifestyle modifcations including regular physical activity/exercise, weight management, moderate sodium restriction and increased consumption of fresh fruit, vegetables, and low fat dairy, alcohol moderation, and smoking cessation.;Monitor prescription use compliance.    Expected Outcomes Long Term: Maintenance of blood pressure at goal levels.;Short Term: Continued assessment and intervention until BP is < 140/62mm HG in hypertensive participants. < 130/62mm HG in hypertensive participants with diabetes, heart failure or chronic kidney disease.    Lipids Yes    Intervention Provide education and support for participant on nutrition & aerobic/resistive exercise along with prescribed medications to achieve LDL 70mg , HDL >40mg .    Expected Outcomes Long Term: Cholesterol controlled with medications as prescribed, with individualized exercise RX and with personalized nutrition plan. Value goals: LDL < 70mg , HDL > 40 mg.;Short Term: Participant states understanding of desired cholesterol values and is compliant with medications prescribed. Participant  is following exercise prescription and nutrition guidelines.          Tobacco Use Initial Evaluation: Social History   Tobacco Use  Smoking Status Former   Current packs/day: 0.00   Average packs/day: 0.1 packs/day for 20.0 years (2.0 ttl pk-yrs)   Types: Cigarettes   Start date: 05/08/1979   Quit date: 05/08/1999   Years since quitting: 25.1  Smokeless Tobacco Never    Exercise Goals and Review:  Exercise Goals     Row Name 06/06/24 1432             Exercise Goals   Increase Physical Activity Yes       Intervention Provide advice, education, support and counseling about physical activity/exercise needs.;Develop an individualized exercise prescription for aerobic and resistive training based on initial evaluation findings, risk stratification, comorbidities and participant's personal goals.       Expected Outcomes Short Term: Attend rehab on a regular basis to increase amount of physical activity.;Long Term: Add in home exercise to make exercise part of routine and to increase amount of physical activity.;Long Term: Exercising regularly at least 3-5 days a week.       Increase Strength and Stamina Yes       Intervention Provide advice, education, support and counseling about physical activity/exercise needs.;Develop  an individualized exercise prescription for aerobic and resistive training based on initial evaluation findings, risk stratification, comorbidities and participant's personal goals.       Expected Outcomes Short Term: Increase workloads from initial exercise prescription for resistance, speed, and METs.;Short Term: Perform resistance training exercises routinely during rehab and add in resistance training at home;Long Term: Improve cardiorespiratory fitness, muscular endurance and strength as measured by increased METs and functional capacity ( )       Able to understand and use rate of perceived exertion (RPE) scale Yes       Intervention Provide education and  explanation on how to use RPE scale       Expected Outcomes Short Term: Able to use RPE daily in rehab to express subjective intensity level;Long Term:  Able to use RPE to guide intensity level when exercising independently       Able to understand and use Dyspnea scale Yes       Intervention Provide education and explanation on how to use Dyspnea scale       Expected Outcomes Short Term: Able to use Dyspnea scale daily in rehab to express subjective sense of shortness of breath during exertion;Long Term: Able to use Dyspnea scale to guide intensity level when exercising independently       Knowledge and understanding of Target Heart Rate Range (THRR) Yes       Intervention Provide education and explanation of THRR including how the numbers were predicted and where they are located for reference       Expected Outcomes Short Term: Able to state/look up THRR;Long Term: Able to use THRR to govern intensity when exercising independently;Short Term: Able to use daily as guideline for intensity in rehab       Able to check pulse independently Yes       Intervention Provide education and demonstration on how to check pulse in carotid and radial arteries.;Review the importance of being able to check your own pulse for safety during independent exercise       Expected Outcomes Long Term: Able to check pulse independently and accurately;Short Term: Able to explain why pulse checking is important during independent exercise       Understanding of Exercise Prescription Yes       Intervention Provide education, explanation, and written materials on patient's individual exercise prescription       Expected Outcomes Short Term: Able to explain program exercise prescription;Long Term: Able to explain home exercise prescription to exercise independently        Copy of goals given to participant.

## 2024-06-07 NOTE — Progress Notes (Signed)
 Pulmonary Individual Treatment Plan  Patient Details  Name: Kyle Patel. MRN: [id number] Date of Birth: 18-Jan-1946 Referring Provider:   Flowsheet Row PULMONARY REHAB COPD ORIENTATION from 06/07/2024 in [facility] CARDIAC REHABILITATION  Referring Provider Geronimo Amel MD    Initial Encounter Date:  Flowsheet Row PULMONARY REHAB COPD ORIENTATION from 06/07/2024 in [facility] CARDIAC REHABILITATION  Date 06/07/24    Visit Diagnosis: Chronic obstructive pulmonary disease, unspecified COPD type (HCC)  ILD (interstitial lung disease) (HCC)  Centrilobular emphysema (HCC)  Patient's Home Medications on Admission:  Current Medications[1]  Past Medical History: Past Medical History:  Diagnosis Date   Acute anterior wall MI (HCC) 04/24/2017   Arthritis    AVN of femur (HCC)    CAD (coronary artery disease)    a. 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   Hyperlipidemia    Pulmonary embolism (HCC) 10/2011    Tobacco Use: Tobacco Use History[2]  Labs: Review Flowsheet  More data exists      Latest Ref Rng & Units 07/10/2023 01/18/2024 02/12/2024 05/16/2024 05/31/2024  Labs for ITP Cardiac and Pulmonary Rehab  Cholestrol <200 mg/dL - - 844  - -  LDL (calc) mg/dL (calc) - - 86  - -  HDL-C > OR = 40 mg/dL - - 52  - -  Trlycerides <150 mg/dL - - 81  - -  Hemoglobin A1c 4.8 - 5.6 % - 6.5  - 6.4  -  PH, Arterial 7.35 - 7.45 7.33  - - - -  PCO2 arterial 32 - 48 mmHg 50  - - - -  Bicarbonate 20.0 - 28.0 mmol/L 26.9  - - - 31.2  31.4   TCO2 22 - 32 mmol/L - - - - 33  33   O2 Saturation % 97.1  - - - 64  63     Details       Multiple values from one day are sorted in reverse-chronological order         Capillary Blood Glucose: Lab Results   Component Value Date   GLUCAP 114 (H) 05/31/2024   GLUCAP 237 (H) 05/19/2022   GLUCAP 164 (H) 05/19/2022   GLUCAP 158 (H) 05/18/2022   GLUCAP 114 (H) 11/18/2011     Pulmonary Assessment Scores:  Pulmonary Assessment Scores     Row Name 06/07/24 0849         ADL UCSD   ADL Phase Entry     SOB Score total 76     Rest 0     Walk 4     Stairs 5     Bath 3     Dress 4     Shop 5       CAT Score   CAT Score 20       mMRC Score   mMRC Score 4       UCSD: Self-administered rating of dyspnea associated with activities of daily living (ADLs) 6-point scale (0 = not at all to 5 = maximal or unable to do because of breathlessness)  Scoring Scores range from 0 to 120.  Minimally important difference is 5 units  CAT: CAT can identify the health impairment of COPD patients and is better correlated with disease progression.  CAT has  a scoring range of zero to 40. The CAT score is classified into four groups of low (less than 10), medium (10 - 20), high (21-30) and very high (31-40) based on the impact level of disease on health status. A CAT score over 10 suggests significant symptoms.  A worsening CAT score could be explained by an exacerbation, poor medication adherence, poor inhaler technique, or progression of COPD or comorbid conditions.  CAT MCID is 2 points  mMRC: mMRC (Modified Medical Research Council) Dyspnea Scale is used to assess the degree of baseline functional disability in patients of respiratory disease due to dyspnea. No minimal important difference is established. A decrease in score of 1 point or greater is considered a positive change.   Pulmonary Function Assessment:  Pulmonary Function Assessment - 06/06/24 1444       Pulmonary Function Tests   FVC% 1.86 %    FEV1% 1.57 %    DLCO% 8.21 %          Exercise Target Goals: Exercise Program Goal: Individual exercise prescription set using results from initial 6 min walk test and THRR while  considering  patients activity barriers and safety.   Exercise Prescription Goal: Initial exercise prescription builds to 30-45 minutes a day of aerobic activity, 2-3 days per week.  Home exercise guidelines will be given to patient during program as part of exercise prescription that the participant will acknowledge.  Activity Barriers & Risk Stratification:  Activity Barriers & Cardiac Risk Stratification - 06/07/24 1219       Activity Barriers & Cardiac Risk Stratification   Activity Barriers Shortness of Breath;Assistive Device;Muscular Weakness;Deconditioning;Joint Problems;Right Knee Replacement;Balance Concerns          6 Minute Walk:  6 Minute Walk     Row Name 06/07/24 1216         6 Minute Walk   Phase Initial     Distance 305 feet     Walk Time 3 minutes     # of Rest Breaks 0  stopped due to desaturation     MPH 1.15     METS 0.91     RPE 13     Perceived Dyspnea  2     VO2 Peak 3.19     Symptoms Yes (comment)     Comments L hip pain (5/10), SOB     Resting HR 71 bpm     Resting BP 124/62     Resting Oxygen  Saturation  94 %     Exercise Oxygen  Saturation  during 6 min walk 79 %     Max Ex. HR 101 bpm     Max Ex. BP 176/74     2 Minute Post BP 152/76       Interval HR   1 Minute HR 86     2 Minute HR 101     3 Minute HR 97     2 Minute Post HR 87     Interval Heart Rate? Yes       Interval Oxygen    Interval Oxygen ? Yes     Baseline Oxygen  Saturation % 94 %  4L NCC     1 Minute Oxygen  Saturation % 83 %     1 Minute Liters of Oxygen  5 L  NCC     2 Minute Oxygen  Saturation % 84 %     2 Minute Liters of Oxygen  5 L     3 Minute Oxygen  Saturation % 79 %  3 Minute Liters of Oxygen  5 L     2 Minute Post Oxygen  Saturation % 96 %     2 Minute Post Liters of Oxygen  5 L        Oxygen  Initial Assessment:  Oxygen  Initial Assessment - 06/07/24 0848       Home Oxygen    Home Oxygen  Device Portable Concentrator;Home Concentrator;E-Tanks    Sleep  Oxygen  Prescription Continuous    Liters per minute 4    Home Exercise Oxygen  Prescription Continuous    Liters per minute 5    Home Resting Oxygen  Prescription Continuous    Liters per minute 4    Compliance with Home Oxygen  Use Yes      Initial 6 min Walk   Oxygen  Used Continuous;E-Tanks    Liters per minute 5      Program Oxygen  Prescription   Program Oxygen  Prescription Continuous;E-Tanks    Liters per minute 5      Intervention   Short Term Goals To learn and exhibit compliance with exercise, home and travel O2 prescription;To learn and understand importance of monitoring SPO2 with pulse oximeter and demonstrate accurate use of the pulse oximeter.;To learn and understand importance of maintaining oxygen  saturations>88%;To learn and demonstrate proper use of respiratory medications;To learn and demonstrate proper pursed lip breathing techniques or other breathing techniques.     Long  Term Goals Exhibits compliance with exercise, home  and travel O2 prescription;Verbalizes importance of monitoring SPO2 with pulse oximeter and return demonstration;Maintenance of O2 saturations>88%;Exhibits proper breathing techniques, such as pursed lip breathing or other method taught during program session;Compliance with respiratory medication;Demonstrates proper use of MDIs          Oxygen  Re-Evaluation:   Oxygen  Discharge (Final Oxygen  Re-Evaluation):   Initial Exercise Prescription:  Initial Exercise Prescription - 06/07/24 1200       Date of Initial Exercise RX and Referring Provider   Date 06/07/24    Referring Provider Geronimo Amel MD      Oxygen    Oxygen  Continuous    Liters 5    Maintain Oxygen  Saturation 88% or higher      NuStep   Level 1    SPM 80    Minutes 15    METs 1.5      Arm Ergometer   Level 1    RPM 25    Minutes 15    METs 1.5      Prescription Details   Frequency (times per week) 3    Duration Progress to 30 minutes of continuous aerobic  without signs/symptoms of physical distress      Intensity   THRR 40-80% of Max Heartrate 100-129    Ratings of Perceived Exertion 11-13    Perceived Dyspnea 0-4      Progression   Progression Continue to progress workloads to maintain intensity without signs/symptoms of physical distress.      Resistance Training   Training Prescription Yes    Weight 4 lb    Reps 10-15          Perform Capillary Blood Glucose checks as needed.  Exercise Prescription Changes:   Exercise Prescription Changes     Row Name 06/07/24 1200             Response to Exercise   Blood Pressure (Admit) 124/62       Blood Pressure (Exercise) 176/74       Blood Pressure (Exit) 132/72       Heart Rate (Admit) 71  bpm       Heart Rate (Exercise) 101 bpm       Heart Rate (Exit) 83 bpm       Oxygen  Saturation (Admit) 94 %       Oxygen  Saturation (Exercise) 79 %       Oxygen  Saturation (Exit) 93 %       Rating of Perceived Exertion (Exercise) 13       Perceived Dyspnea (Exercise) 2       Symptoms L hip pain (5/10/), SOB       Comments walk test results          Exercise Comments:   Exercise Comments     Row Name 06/06/24 1433           Exercise Comments No home exercise at the moment. Causes him pain in his right hip when he walks.          Exercise Goals and Review:   Exercise Goals     Row Name 06/06/24 1432             Exercise Goals   Increase Physical Activity Yes       Intervention Provide advice, education, support and counseling about physical activity/exercise needs.;Develop an individualized exercise prescription for aerobic and resistive training based on initial evaluation findings, risk stratification, comorbidities and participant's personal goals.       Expected Outcomes Short Term: Attend rehab on a regular basis to increase amount of physical activity.;Long Term: Add in home exercise to make exercise part of routine and to increase amount of physical  activity.;Long Term: Exercising regularly at least 3-5 days a week.       Increase Strength and Stamina Yes       Intervention Provide advice, education, support and counseling about physical activity/exercise needs.;Develop an individualized exercise prescription for aerobic and resistive training based on initial evaluation findings, risk stratification, comorbidities and participant's personal goals.       Expected Outcomes Short Term: Increase workloads from initial exercise prescription for resistance, speed, and METs.;Short Term: Perform resistance training exercises routinely during rehab and add in resistance training at home;Long Term: Improve cardiorespiratory fitness, muscular endurance and strength as measured by increased METs and functional capacity ( )       Able to understand and use rate of perceived exertion (RPE) scale Yes       Intervention Provide education and explanation on how to use RPE scale       Expected Outcomes Short Term: Able to use RPE daily in rehab to express subjective intensity level;Long Term:  Able to use RPE to guide intensity level when exercising independently       Able to understand and use Dyspnea scale Yes       Intervention Provide education and explanation on how to use Dyspnea scale       Expected Outcomes Short Term: Able to use Dyspnea scale daily in rehab to express subjective sense of shortness of breath during exertion;Long Term: Able to use Dyspnea scale to guide intensity level when exercising independently       Knowledge and understanding of Target Heart Rate Range (THRR) Yes       Intervention Provide education and explanation of THRR including how the numbers were predicted and where they are located for reference       Expected Outcomes Short Term: Able to state/look up THRR;Long Term: Able to use THRR to govern intensity when exercising independently;Short Term: Able to use  daily as guideline for intensity in rehab       Able to check  pulse independently Yes       Intervention Provide education and demonstration on how to check pulse in carotid and radial arteries.;Review the importance of being able to check your own pulse for safety during independent exercise       Expected Outcomes Long Term: Able to check pulse independently and accurately;Short Term: Able to explain why pulse checking is important during independent exercise       Understanding of Exercise Prescription Yes       Intervention Provide education, explanation, and written materials on patient's individual exercise prescription       Expected Outcomes Short Term: Able to explain program exercise prescription;Long Term: Able to explain home exercise prescription to exercise independently          Exercise Goals Re-Evaluation :   Discharge Exercise Prescription (Final Exercise Prescription Changes):  Exercise Prescription Changes - 06/07/24 1200       Response to Exercise   Blood Pressure (Admit) 124/62    Blood Pressure (Exercise) 176/74    Blood Pressure (Exit) 132/72    Heart Rate (Admit) 71 bpm    Heart Rate (Exercise) 101 bpm    Heart Rate (Exit) 83 bpm    Oxygen  Saturation (Admit) 94 %    Oxygen  Saturation (Exercise) 79 %    Oxygen  Saturation (Exit) 93 %    Rating of Perceived Exertion (Exercise) 13    Perceived Dyspnea (Exercise) 2    Symptoms L hip pain (5/10/), SOB    Comments walk test results          Nutrition:  Target Goals: Understanding of nutrition guidelines, daily intake of sodium 1500mg , cholesterol 200mg , calories 30% from fat and 7% or less from saturated fats, daily to have 5 or more servings of fruits and vegetables.  Biometrics:  Pre Biometrics - 06/07/24 1234       Pre Biometrics   Height 5' 9.5 (1.765 m)    Weight 101.7 kg    Waist Circumference 43 inches    Hip Circumference 42 inches    Waist to Hip Ratio 1.02 %    BMI (Calculated) 32.63    Grip Strength 15.9 kg    Single Leg Stand 2.2 seconds            Nutrition Therapy Plan and Nutrition Goals:  Nutrition Therapy & Goals - 06/06/24 1436       Intervention Plan   Intervention Nutrition handout(s) given to patient.;Prescribe, educate and counsel regarding individualized specific dietary modifications aiming towards targeted core components such as weight, hypertension, lipid management, diabetes, heart failure and other comorbidities.    Expected Outcomes Short Term Goal: Understand basic principles of dietary content, such as calories, fat, sodium, cholesterol and nutrients.;Short Term Goal: A plan has been developed with personal nutrition goals set during dietitian appointment.;Long Term Goal: Adherence to prescribed nutrition plan.          Nutrition Assessments:  MEDIFICTS Score Key: >=70 Need to make dietary changes  40-70 Heart Healthy Diet <= 40 Therapeutic Level Cholesterol Diet  Flowsheet Row PULMONARY REHAB COPD ORIENTATION from 06/07/2024 in [facility] CARDIAC REHABILITATION  Picture Your Plate Total Score on Admission 64 (P)    Picture Your Plate Scores: <59 Unhealthy dietary pattern with much room for improvement. 41-50 Dietary pattern unlikely to meet recommendations for good health and room for improvement. 51-60 More healthful dietary pattern, with some  room for improvement.  >60 Healthy dietary pattern, although there may be some specific behaviors that could be improved.    Nutrition Goals Re-Evaluation:   Nutrition Goals Discharge (Final Nutrition Goals Re-Evaluation):   Psychosocial: Target Goals: Acknowledge presence or absence of significant depression and/or stress, maximize coping skills, provide positive support system. Participant is able to verbalize types and ability to use techniques and skills needed for reducing stress and depression.  Initial Review & Psychosocial Screening:  Initial Psych Review & Screening - 06/06/24 1436       Initial Review   Current issues with None  Identified      Family Dynamics   Good Support System? Yes      Barriers   Psychosocial barriers to participate in program There are no identifiable barriers or psychosocial needs.      Screening Interventions   Interventions Encouraged to exercise    Expected Outcomes Short Term goal: Identification and review with participant of any Quality of Life or Depression concerns found by scoring the questionnaire.;Long Term goal: The participant improves quality of Life and PHQ9 Scores as seen by post scores and/or verbalization of changes;Long Term Goal: Stressors or current issues are controlled or eliminated.;Short Term goal: Utilizing psychosocial counselor, staff and physician to assist with identification of specific Stressors or current issues interfering with healing process. Setting desired goal for each stressor or current issue identified.          Quality of Life Scores:  Scores of 19 and below usually indicate a poorer quality of life in these areas.  A difference of  2-3 points is a clinically meaningful difference.  A difference of 2-3 points in the total score of the Quality of Life Index has been associated with significant improvement in overall quality of life, self-image, physical symptoms, and general health in studies assessing change in quality of life.  PHQ-9: Review Flowsheet  More data exists      06/07/2024 05/16/2024 01/18/2024 12/11/2023 08/20/2023  Depression screen PHQ 2/9  Decreased Interest 0 3 3 3  0  Down, Depressed, Hopeless 0 3 3 3  0  PHQ - 2 Score 0 6 6 6  0  Altered sleeping 0 0 0 - 0  Tired, decreased energy 2 0 0 0 0  Change in appetite 0 0 0 0 0  Feeling bad or failure about yourself  1 0 0 0 0  Trouble concentrating 0 0 0 0 0  Moving slowly or fidgety/restless 1 0 0 0 0  Suicidal thoughts 0 0 0 0 0  PHQ-9 Score 4 6 6   - 0   Difficult doing work/chores Somewhat difficult Not difficult at all Not difficult at all Not difficult at all Not difficult at  all    Details       Data saved with a previous flowsheet row definition        Interpretation of Total Score  Total Score Depression Severity:  1-4 = Minimal depression, 5-9 = Mild depression, 10-14 = Moderate depression, 15-19 = Moderately severe depression, 20-27 = Severe depression   Psychosocial Evaluation and Intervention:  Psychosocial Evaluation - 06/06/24 1437       Psychosocial Evaluation & Interventions   Interventions Encouraged to exercise with the program and follow exercise prescription    Comments Mr. Chandler is a pleasant 79 year old male who is coming into rehab for several pulmonary diagnosis'. He came back in 2019 for cardiac rehab and graduated with 36 sessions. He currently wears  4L of oxygen , and turns it up to 5 when ambulating. He uses a cane to get around, and he states his right hip causes him a lot of pain when walking. His doctor stated he needs surgery on that hip. He has had right knee surgery back in 2022. He doesn't do any home exercise due to his hip pain. He is a retired psychologist, occupational.  He is scheduled for his orientation at 0830 tomorrow morning.    Continue Psychosocial Services  Follow up required by staff          Psychosocial Re-Evaluation:   Psychosocial Discharge (Final Psychosocial Re-Evaluation):   Education: Education Goals: Education classes will be provided on a weekly basis, covering required topics. Participant will state understanding/return demonstration of topics presented.  Learning Barriers/Preferences:  Learning Barriers/Preferences - 06/06/24 1435       Learning Barriers/Preferences   Learning Barriers None    Learning Preferences Group Instruction;Individual Instruction;Pictoral;Skilled Demonstration;Verbal Instruction;Video;Written Material;Computer/Internet;Audio          Education Topics: Know Your Numbers Group instruction that is supported by a PowerPoint presentation. Instructor discusses importance of knowing and  understanding resting, exercise, and post-exercise oxygen  saturation, heart rate, and blood pressure. Oxygen  saturation, heart rate, blood pressure, rating of perceived exertion, and dyspnea are reviewed along with a normal range for these values.    Exercise for the Pulmonary Patient Group instruction that is supported by a PowerPoint presentation. Instructor discusses benefits of exercise, core components of exercise, frequency, duration, and intensity of an exercise routine, importance of utilizing pulse oximetry during exercise, safety while exercising, and options of places to exercise outside of rehab.    MET Level  Group instruction provided by PowerPoint, verbal discussion, and written material to support subject matter. Instructor reviews what METs are and how to increase METs.    Pulmonary Medications Verbally interactive group education provided by instructor with focus on inhaled medications and proper administration.   Anatomy and Physiology of the Respiratory System Group instruction provided by PowerPoint, verbal discussion, and written material to support subject matter. Instructor reviews respiratory cycle and anatomical components of the respiratory system and their functions. Instructor also reviews differences in obstructive and restrictive respiratory diseases with examples of each.    Oxygen  Safety Group instruction provided by PowerPoint, verbal discussion, and written material to support subject matter. There is an overview of What is Oxygen  and Why do we need it.  Instructor also reviews how to create a safe environment for oxygen  use, the importance of using oxygen  as prescribed, and the risks of noncompliance. There is a brief discussion on traveling with oxygen  and resources the patient may utilize.   Oxygen  Use Group instruction provided by PowerPoint, verbal discussion, and written material to discuss how supplemental oxygen  is prescribed and different types  of oxygen  supply systems. Resources for more information are provided.    Breathing Techniques Group instruction that is supported by demonstration and informational handouts. Instructor discusses the benefits of pursed lip and diaphragmatic breathing and detailed demonstration on how to perform both.     Risk Factor Reduction Group instruction that is supported by a PowerPoint presentation. Instructor discusses the definition of a risk factor, different risk factors for pulmonary disease, and how the heart and lungs work together.   Pulmonary Diseases Group instruction provided by PowerPoint, verbal discussion, and written material to support subject matter. Instructor gives an overview of the different type of pulmonary diseases. There is also a discussion on risk factors and  symptoms as well as ways to manage the diseases.   Stress and Energy Conservation Group instruction provided by PowerPoint, verbal discussion, and written material to support subject matter. Instructor gives an overview of stress and the impact it can have on the body. Instructor also reviews ways to reduce stress. There is also a discussion on energy conservation and ways to conserve energy throughout the day.   Warning Signs and Symptoms Group instruction provided by PowerPoint, verbal discussion, and written material to support subject matter. Instructor reviews warning signs and symptoms of stroke, heart attack, cold and flu. Instructor also reviews ways to prevent the spread of infection.   Other Education Group or individual verbal, written, or video instructions that support the educational goals of the pulmonary rehab program.    Knowledge Questionnaire Score:  Knowledge Questionnaire Score - 06/07/24 1235       Knowledge Questionnaire Score   Pre Score 15/18          Core Components/Risk Factors/Patient Goals at Admission:  Personal Goals and Risk Factors at Admission - 06/07/24 1236        Core Components/Risk Factors/Patient Goals on Admission    Weight Management Yes;Obesity;Weight Loss    Intervention Weight Management: Develop a combined nutrition and exercise program designed to reach desired caloric intake, while maintaining appropriate intake of nutrient and fiber, sodium and fats, and appropriate energy expenditure required for the weight goal.;Weight Management: Provide education and appropriate resources to help participant work on and attain dietary goals.;Weight Management/Obesity: Establish reasonable short term and long term weight goals.;Obesity: Provide education and appropriate resources to help participant work on and attain dietary goals.    Admit Weight 224 lb 1.6 oz (101.7 kg)    Goal Weight: Short Term 220 lb (99.8 kg)    Goal Weight: Long Term 215 lb (97.5 kg)    Expected Outcomes Short Term: Continue to assess and modify interventions until short term weight is achieved;Long Term: Adherence to nutrition and physical activity/exercise program aimed toward attainment of established weight goal;Weight Loss: Understanding of general recommendations for a balanced deficit meal plan, which promotes 1-2 lb weight loss per week and includes a negative energy balance of [phone] kcal/d;Understanding recommendations for meals to include 15-35% energy as protein, 25-35% energy from fat, 35-60% energy from carbohydrates, less than 200mg  of dietary cholesterol, 20-35 gm of total fiber daily;Understanding of distribution of calorie intake throughout the day with the consumption of 4-5 meals/snacks    Improve shortness of breath with ADL's Yes    Intervention Provide education, individualized exercise plan and daily activity instruction to help decrease symptoms of SOB with activities of daily living.    Expected Outcomes Short Term: Improve cardiorespiratory fitness to achieve a reduction of symptoms when performing ADLs;Long Term: Be able to perform more ADLs without symptoms or  delay the onset of symptoms    Increase knowledge of respiratory medications and ability to use respiratory devices properly  Yes    Intervention Provide education and demonstration as needed of appropriate use of medications, inhalers, and oxygen  therapy.    Expected Outcomes Short Term: Achieves understanding of medications use. Understands that oxygen  is a medication prescribed by physician. Demonstrates appropriate use of inhaler and oxygen  therapy.;Long Term: Maintain appropriate use of medications, inhalers, and oxygen  therapy.    Hypertension Yes    Intervention Provide education on lifestyle modifcations including regular physical activity/exercise, weight management, moderate sodium restriction and increased consumption of fresh fruit, vegetables, and low fat dairy, alcohol  moderation, and smoking cessation.;Monitor prescription use compliance.    Expected Outcomes Long Term: Maintenance of blood pressure at goal levels.;Short Term: Continued assessment and intervention until BP is < 140/72mm HG in hypertensive participants. < 130/75mm HG in hypertensive participants with diabetes, heart failure or chronic kidney disease.    Lipids Yes    Intervention Provide education and support for participant on nutrition & aerobic/resistive exercise along with prescribed medications to achieve LDL 70mg , HDL >40mg .    Expected Outcomes Long Term: Cholesterol controlled with medications as prescribed, with individualized exercise RX and with personalized nutrition plan. Value goals: LDL < 70mg , HDL > 40 mg.;Short Term: Participant states understanding of desired cholesterol values and is compliant with medications prescribed. Participant is following exercise prescription and nutrition guidelines.          Core Components/Risk Factors/Patient Goals Review:    Core Components/Risk Factors/Patient Goals at Discharge (Final Review):    ITP Comments:  ITP Comments     Row Name 06/06/24 1438 06/07/24  1214         ITP Comments Completed virtual orientation today.  EP evaluation is scheduled for 05/07/25 at 0830 .  Documentation for diagnosis can be found in [facility] encounter 06/01/24. Patient attend orientation today.  Patient is attending Pulmonary Rehabilitation Program.  Documentation for diagnosis can be found in [facility] OV 06/01/24.  Reviewed medical chart, RPE/RPD, gym safety, and program guidelines.  Patient was fitted to equipment they will be using during rehab.  Patient is scheduled to start exercise on Wednesday 06/08/24 at 915.   Initial ITP created and sent for review and signature by Dr. Anton Kelp, Medical Director for Pulmonary Rehabilitation Program.  Service Time [id number]         Comments: Initial ITP     [1]  Current Outpatient Medications:    albuterol  (VENTOLIN  HFA) 108 (90 Base) MCG/ACT inhaler, Inhale 2 puffs into the lungs every 6 (six) hours as needed for wheezing or shortness of breath., Disp: 18 g, Rfl: 3   amLODipine  (NORVASC ) 10 MG tablet, TAKE ONE TABLET BY MOUTH ONCE DAILY., Disp: 90 tablet, Rfl: 2   apixaban  (ELIQUIS ) 5 MG TABS tablet, Take 1 tablet (5 mg total) by mouth 2 (two) times daily. Has 30 day free voucher, Disp: 60 tablet, Rfl: 6   atropine  1 % ophthalmic solution, Place 1 drop into both eyes 2 (two) times daily., Disp: , Rfl:    calcitRIOL  (ROCALTROL ) 0.25 MCG capsule, Take 0.25 mcg by mouth every Monday, Wednesday, and Friday., Disp: , Rfl:    Coenzyme Q10-Vitamin E 100-150 MG-UNIT CAPS, Take 1 capsule by mouth at bedtime., Disp: , Rfl:    dorzolamide (TRUSOPT) 2 % ophthalmic solution, Place 1 drop into both eyes 2 (two) times daily., Disp: , Rfl:    ezetimibe  (ZETIA ) 10 MG tablet, Take 1 tablet (10 mg total) by mouth daily., Disp: 90 tablet, Rfl: 3   FARXIGA 5 MG TABS tablet, Take 5 mg by mouth daily., Disp: , Rfl:    furosemide  (LASIX ) 20 MG tablet, TAKE 1 TABLET BY MOUTH ONCE DAILY., Disp: 90 tablet, Rfl: 2   hydrALAZINE  (APRESOLINE ) 25 MG tablet, Take  1 tablet (25 mg total) by mouth 2 (two) times daily., Disp: 180 tablet, Rfl: 1   latanoprost  (XALATAN ) 0.005 % ophthalmic solution, Place 1 drop into both eyes at bedtime., Disp: , Rfl:    metoprolol  succinate (TOPROL -XL) 50 MG 24 hr tablet, Take 1 tablet (50 mg total) by mouth daily. Take  with or immediately following a meal., Disp: 90 tablet, Rfl: 3   Misc Natural Products (OSTEO BI-FLEX ADV JOINT SHIELD) TABS, Take 1 tablet by mouth daily., Disp: , Rfl:    Multiple Vitamins-Minerals (CENTRUM PO), Take 1 tablet by mouth every morning., Disp: , Rfl:    olmesartan  (BENICAR ) 5 MG tablet, Take 1 tablet (5 mg total) by mouth daily., Disp: 90 tablet, Rfl: 4   pregabalin  (LYRICA ) 200 MG capsule, TAKE ONE CAPSULE BY MOUTH TWICE DAILY., Disp: 60 capsule, Rfl: 5   rosuvastatin  (CRESTOR ) 10 MG tablet, TAKE (1) TABLET BY MOUTH ONCE DAILY., Disp: 90 tablet, Rfl: 3   sodium bicarbonate  650 MG tablet, Take 650 mg by mouth 3 (three) times daily., Disp: , Rfl:    timolol  (TIMOPTIC ) 0.5 % ophthalmic solution, Place 1 drop into both eyes 2 (two) times daily., Disp: , Rfl:    tobramycin  (TOBREX ) 0.3 % ophthalmic solution, Place 1 drop into both eyes every 6 (six) hours. (Patient not taking: Reported on 02/02/2024), Disp: , Rfl:  [2]  Social History Tobacco Use  Smoking Status Former   Current packs/day: 0.00   Average packs/day: 0.1 packs/day for 20.0 years (2.0 ttl pk-yrs)   Types: Cigarettes   Start date: 05/08/1979   Quit date: 05/08/1999   Years since quitting: 25.1  Smokeless Tobacco Never

## 2024-06-08 ENCOUNTER — Encounter (HOSPITAL_COMMUNITY): Admission: RE | Admit: 2024-06-08 | Discharge: 2024-06-08 | Attending: Internal Medicine

## 2024-06-08 DIAGNOSIS — J849 Interstitial pulmonary disease, unspecified: Secondary | ICD-10-CM

## 2024-06-08 DIAGNOSIS — J432 Centrilobular emphysema: Secondary | ICD-10-CM

## 2024-06-08 DIAGNOSIS — J449 Chronic obstructive pulmonary disease, unspecified: Secondary | ICD-10-CM

## 2024-06-08 NOTE — Progress Notes (Signed)
 Daily Session Note  Patient Details  Name: Kyle Patel. MRN: [id number] Date of Birth: 06-25-1945 Referring Provider:   Flowsheet Row PULMONARY REHAB COPD ORIENTATION from 06/07/2024 in [facility] CARDIAC REHABILITATION  Referring Provider Geronimo Amel MD    Encounter Date: 06/08/2024  Check In:  Session Check In - 06/08/24 0915       Check-In   Supervising physician immediately available to respond to emergencies See telemetry face sheet for immediately available MD    Location [facility]    Staff Present Adrien Louder, RN, BSN;Heather Con, BS, Exercise Physiologist;Brittany Jackquline, BSN, RN, WTA-C    Virtual Visit No    Medication changes reported     No    Fall or balance concerns reported    No    Warm-up and Cool-down Performed on first and last piece of equipment    Resistance Training Performed Yes    VAD Patient? No    PAD/SET Patient? No      Pain Assessment   Currently in Pain? No/denies    Pain Score 0-No pain    Multiple Pain Sites No          Capillary Blood Glucose: No results found for this or any previous visit (from the past 24 hours).    Tobacco Use History[1]  Goals Met:  Proper associated with RPD/PD & O2 Sat Independence with exercise equipment Using PLB without cueing & demonstrates good technique Exercise tolerated well No report of concerns or symptoms today Strength training completed today  Goals Unmet:  Not Applicable  Comments: Pt able to follow exercise prescription today without complaint.  Will continue to monitor for progression.        [1]  Social History Tobacco Use  Smoking Status Former   Current packs/day: 0.00   Average packs/day: 0.1 packs/day for 20.0 years (2.0 ttl pk-yrs)   Types: Cigarettes   Start date: 05/08/1979   Quit date: 05/08/1999   Years since quitting: 25.1  Smokeless Tobacco Never

## 2024-06-09 NOTE — Telephone Encounter (Signed)
 ATC x1. LVM to return call and let us  know the best time to call so we can reach him to give the information per Dr. Geronimo and [name] NP.  Will await return call.

## 2024-06-09 NOTE — Telephone Encounter (Signed)
 Copied from CRM #[id number]. Topic: General - Other >> Jun 06, 2024  4:19 PM Devaughn RAMAN wrote: Reason for CRM: Pt returning [place] phone call, contacted [facility] no answer. Please f/u with pt

## 2024-06-09 NOTE — Telephone Encounter (Signed)
 I called and spoke with the pt and notified him of response from [facility]  He verbalized understanding   He is taking the lasix  20 mg daily  He is able to come here for labs 06/13/24 and I have scheduled that appt  Nothing further needed

## 2024-06-10 ENCOUNTER — Encounter (HOSPITAL_COMMUNITY)
Admission: RE | Admit: 2024-06-10 | Discharge: 2024-06-10 | Disposition: A | Discharge status: Home / Self Care | Source: Ambulatory Visit | Attending: Internal Medicine | Admitting: Internal Medicine

## 2024-06-10 DIAGNOSIS — J849 Interstitial pulmonary disease, unspecified: Secondary | ICD-10-CM

## 2024-06-10 DIAGNOSIS — J449 Chronic obstructive pulmonary disease, unspecified: Secondary | ICD-10-CM

## 2024-06-10 DIAGNOSIS — J432 Centrilobular emphysema: Secondary | ICD-10-CM

## 2024-06-10 NOTE — Progress Notes (Signed)
 Daily Session Note  Patient Details  Name: Kyle Patel. MRN: [id number] Date of Birth: [birth year] Referring Provider:   Flowsheet Row PULMONARY REHAB COPD ORIENTATION from 06/07/2024 in [facility] CARDIAC REHABILITATION  Referring Provider Geronimo Amel MD    Encounter Date: 06/10/2024  Check In:  Session Check In - 06/10/24 0924       Check-In   Supervising physician immediately available to respond to emergencies See telemetry face sheet for immediately available MD    Location [facility]    Staff Present Richerd Buddle, RN;Jessica Carlin, MA, RCEP, CCRP, CCET;Kiandre Spagnolo [place], BSN, RN, [facility]    Virtual Visit No    Medication changes reported     No    Fall or balance concerns reported    No    Tobacco Cessation No Change    Warm-up and Cool-down Performed on first and last piece of equipment    Resistance Training Performed Yes    VAD Patient? No    PAD/SET Patient? No      Pain Assessment   Currently in Pain? No/denies          Capillary Blood Glucose: No results found for this or any previous visit (from the past 24 hours).    Tobacco Use History[1]  Goals Met:  Proper associated with RPD/PD & O2 Sat Independence with exercise equipment Improved SOB with ADL's Using PLB without cueing & demonstrates good technique Exercise tolerated well No report of concerns or symptoms today Strength training completed today  Goals Unmet:  Not Applicable  Comments: Pt able to follow exercise prescription today without complaint.  Will continue to monitor for progression.        [1]  Social History Tobacco Use  Smoking Status Former   Current packs/day: 0.00   Average packs/day: 0.1 packs/day for 20.0 years (2.0 ttl pk-yrs)   Types: Cigarettes   Start date: 05/08/1979   Quit date: 05/08/1999   Years since quitting: 25.1  Smokeless Tobacco Never

## 2024-06-12 ENCOUNTER — Other Ambulatory Visit: Payer: Self-pay | Admitting: Cardiology

## 2024-06-13 ENCOUNTER — Other Ambulatory Visit

## 2024-06-13 ENCOUNTER — Encounter: Payer: Self-pay | Admitting: Internal Medicine

## 2024-06-13 ENCOUNTER — Encounter (HOSPITAL_COMMUNITY)
Admission: RE | Admit: 2024-06-13 | Discharge: 2024-06-13 | Disposition: A | Source: Ambulatory Visit | Attending: Internal Medicine

## 2024-06-13 DIAGNOSIS — J432 Centrilobular emphysema: Secondary | ICD-10-CM

## 2024-06-13 DIAGNOSIS — J449 Chronic obstructive pulmonary disease, unspecified: Secondary | ICD-10-CM

## 2024-06-13 DIAGNOSIS — J849 Interstitial pulmonary disease, unspecified: Secondary | ICD-10-CM

## 2024-06-13 NOTE — Progress Notes (Signed)
 Daily Session Note  Patient Details  Name: Kyle Patel. MRN: [id number] Date of Birth: Mar 03, 1946 Referring Provider:   Flowsheet Row PULMONARY REHAB COPD ORIENTATION from 06/07/2024 in [facility] CARDIAC REHABILITATION  Referring Provider Geronimo Amel MD    Encounter Date: 06/13/2024  Check In:  Session Check In - 06/13/24 0909       Check-In   Supervising physician immediately available to respond to emergencies See telemetry face sheet for immediately available MD    Location [facility]    Staff Present Laymon Rattler, BSN, RN, WTA-C;Heather Con, BS, Exercise Physiologist    Virtual Visit No    Medication changes reported     No    Fall or balance concerns reported    No    Tobacco Cessation No Change    Warm-up and Cool-down Performed on first and last piece of equipment    Resistance Training Performed Yes    VAD Patient? No    PAD/SET Patient? No      Pain Assessment   Currently in Pain? No/denies          Capillary Blood Glucose: No results found for this or any previous visit (from the past 24 hours).    Tobacco Use History[1]  Goals Met:  Proper associated with RPD/PD & O2 Sat Independence with exercise equipment Improved SOB with ADL's Using PLB without cueing & demonstrates good technique Exercise tolerated well No report of concerns or symptoms today Strength training completed today  Goals Unmet:  Not Applicable  Comments: Pt able to follow exercise prescription today without complaint.  Will continue to monitor for progression.        [1]  Social History Tobacco Use  Smoking Status Former   Current packs/day: 0.00   Average packs/day: 0.1 packs/day for 20.0 years (2.0 ttl pk-yrs)   Types: Cigarettes   Start date: 05/08/1979   Quit date: 05/08/1999   Years since quitting: 25.1  Smokeless Tobacco Never

## 2024-06-15 ENCOUNTER — Encounter (HOSPITAL_COMMUNITY)
Admission: RE | Admit: 2024-06-15 | Discharge: 2024-06-15 | Disposition: A | Source: Ambulatory Visit | Attending: Internal Medicine

## 2024-06-15 DIAGNOSIS — J849 Interstitial pulmonary disease, unspecified: Secondary | ICD-10-CM

## 2024-06-15 DIAGNOSIS — J432 Centrilobular emphysema: Secondary | ICD-10-CM

## 2024-06-15 DIAGNOSIS — J449 Chronic obstructive pulmonary disease, unspecified: Secondary | ICD-10-CM

## 2024-06-15 LAB — MYOMARKER 3 PLUS PROFILE (RDL)
Anti-EJ Ab (RDL): NEGATIVE
Anti-Jo-1 Ab (RDL): <20 U
Anti-Ku Ab (RDL): NEGATIVE
Anti-MDA-5 Ab (CADM-140)(RDL): <20 U
Anti-Mi-2 Ab (RDL): NEGATIVE
Anti-NXP-2 (P140) Ab (RDL): <20 U
Anti-OJ Ab (RDL): NEGATIVE
Anti-PL-12 Ab (RDL: NEGATIVE
Anti-PL-7 Ab (RDL): NEGATIVE
Anti-PM/Scl-100 Ab (RDL): <20 U
Anti-SAE1 Ab, IgG (RDL): <20 U
Anti-SRP Ab (RDL): NEGATIVE
Anti-SS-A 52kD Ab, IgG (RDL): <20 U
Anti-TIF-1gamma Ab (RDL): <20 U
Anti-U1 RNP Ab (RDL): <20 U
Anti-U2 RNP Ab (RDL): NEGATIVE
Anti-U3 RNP (Fibrillarin)(RDL): NEGATIVE

## 2024-06-15 NOTE — Progress Notes (Signed)
 Daily Session Note  Patient Details  Name: Kyle Patel. MRN: [id number] Date of Birth: 09-24-1945 Referring Provider:   Flowsheet Row PULMONARY REHAB COPD ORIENTATION from 06/07/2024 in [facility] CARDIAC REHABILITATION  Referring Provider Geronimo Amel MD    Encounter Date: 06/15/2024  Check In:  Session Check In - 06/15/24 0940       Check-In   Supervising physician immediately available to respond to emergencies See telemetry face sheet for immediately available MD    Location [facility]    Staff Present Powell Benders, BS, Exercise Physiologist;Hillary Dean BSN, RN    Virtual Visit No    Medication changes reported     No    Fall or balance concerns reported    No    Tobacco Cessation No Change    Warm-up and Cool-down Performed on first and last piece of equipment    Resistance Training Performed Yes    VAD Patient? No    PAD/SET Patient? No      Pain Assessment   Currently in Pain? No/denies    Pain Score 0-No pain    Multiple Pain Sites No          Capillary Blood Glucose: No results found for this or any previous visit (from the past 24 hours).    Tobacco Use History[1]  Goals Met:  Independence with exercise equipment Exercise tolerated well No report of concerns or symptoms today Strength training completed today  Goals Unmet:  Not Applicable  Comments: Pt able to follow exercise prescription today without complaint.  Will continue to monitor for progression.        [1]  Social History Tobacco Use  Smoking Status Former   Current packs/day: 0.00   Average packs/day: 0.1 packs/day for 20.0 years (2.0 ttl pk-yrs)   Types: Cigarettes   Start date: 05/08/1979   Quit date: 05/08/1999   Years since quitting: 25.1  Smokeless Tobacco Never

## 2024-06-16 LAB — HISTONE ANTIBODIES, IGG, BLOOD: Histone Antibodies: <1 U

## 2024-06-16 LAB — ANTI-SMITH ANTIBODY: ENA SM Ab Ser-aCnc: <1 AI

## 2024-06-16 LAB — ANTI-DNA ANTIBODY, DOUBLE-STRANDED: ds DNA Ab: 1 [IU]/mL

## 2024-06-17 ENCOUNTER — Encounter (HOSPITAL_COMMUNITY): Admission: RE | Admit: 2024-06-17 | Source: Ambulatory Visit

## 2024-06-17 ENCOUNTER — Ambulatory Visit: Payer: Self-pay | Admitting: Primary Care

## 2024-06-17 DIAGNOSIS — J849 Interstitial pulmonary disease, unspecified: Secondary | ICD-10-CM

## 2024-06-17 DIAGNOSIS — J432 Centrilobular emphysema: Secondary | ICD-10-CM

## 2024-06-17 DIAGNOSIS — J449 Chronic obstructive pulmonary disease, unspecified: Secondary | ICD-10-CM

## 2024-06-17 NOTE — Progress Notes (Signed)
 Daily Session Note  Patient Details  Name: Kyle Patel. MRN: [id number] Date of Birth: 07/20/1945 Referring Provider:   Flowsheet Row PULMONARY REHAB COPD ORIENTATION from 06/07/2024 in [facility] CARDIAC REHABILITATION  Referring Provider Geronimo Amel MD    Encounter Date: 06/17/2024  Check In:  Session Check In - 06/17/24 0906       Check-In   Supervising physician immediately available to respond to emergencies See telemetry face sheet for immediately available MD    Location [facility]    Staff Present Powell Benders, BS, Exercise Physiologist;Alanee Ting Jackquline, BSN, RN, WTA-C    Virtual Visit No    Medication changes reported     No    Fall or balance concerns reported    No    Tobacco Cessation No Change    Warm-up and Cool-down Performed on first and last piece of equipment    Resistance Training Performed Yes    VAD Patient? No    PAD/SET Patient? No      Pain Assessment   Currently in Pain? No/denies          Capillary Blood Glucose: No results found for this or any previous visit (from the past 24 hours).    Tobacco Use History[1]  Goals Met:  Proper associated with RPD/PD & O2 Sat Independence with exercise equipment Improved SOB with ADL's Using PLB without cueing & demonstrates good technique Exercise tolerated well No report of concerns or symptoms today Strength training completed today  Goals Unmet:  Not Applicable  Comments: Pt able to follow exercise prescription today without complaint.  Will continue to monitor for progression.        [1]  Social History Tobacco Use  Smoking Status Former   Current packs/day: 0.00   Average packs/day: 0.1 packs/day for 20.0 years (2.0 ttl pk-yrs)   Types: Cigarettes   Start date: 05/08/1979   Quit date: 05/08/1999   Years since quitting: 25.1  Smokeless Tobacco Never

## 2024-06-20 ENCOUNTER — Encounter (HOSPITAL_COMMUNITY)

## 2024-06-21 ENCOUNTER — Encounter (HOSPITAL_COMMUNITY): Payer: Self-pay | Admitting: *Deleted

## 2024-06-21 DIAGNOSIS — J449 Chronic obstructive pulmonary disease, unspecified: Secondary | ICD-10-CM

## 2024-06-21 DIAGNOSIS — J849 Interstitial pulmonary disease, unspecified: Secondary | ICD-10-CM

## 2024-06-21 DIAGNOSIS — J432 Centrilobular emphysema: Secondary | ICD-10-CM

## 2024-06-21 NOTE — Progress Notes (Signed)
 Pulmonary Individual Treatment Plan  Patient Details  Name: Kyle Patel. MRN: [id number] Date of Birth: 08-15-1945 Referring Provider:   Flowsheet Row PULMONARY REHAB COPD ORIENTATION from 06/07/2024 in [facility] CARDIAC REHABILITATION  Referring Provider Geronimo Amel MD    Initial Encounter Date:  Flowsheet Row PULMONARY REHAB COPD ORIENTATION from 06/07/2024 in [facility]  Date 06/07/24    Visit Diagnosis: Chronic obstructive pulmonary disease, unspecified COPD type (HCC)  Centrilobular emphysema (HCC)  ILD (interstitial lung disease) (HCC)  Patient's Home Medications on Admission:  Current Medications[1]  Past Medical History: Past Medical History:  Diagnosis Date   Acute anterior wall MI (HCC) 04/24/2017   Arthritis    AVN of femur (HCC)    CAD (coronary artery disease)    a. 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   Hyperlipidemia    Pulmonary embolism (HCC) 10/2011    Tobacco Use: Tobacco Use History[2]  Labs: Review Flowsheet  More data exists      Latest Ref Rng & Units 07/10/2023 01/18/2024 02/12/2024 05/16/2024 05/31/2024  Labs for ITP Cardiac and Pulmonary Rehab  Cholestrol <200 mg/dL - - 844  - -  LDL (calc) mg/dL (calc) - - 86  - -  HDL-C > OR = 40 mg/dL - - 52  - -  Trlycerides <150 mg/dL - - 81  - -  Hemoglobin A1c 4.8 - 5.6 % - 6.5  - 6.4  -  PH, Arterial 7.35 - 7.45 7.33  - - - -  PCO2 arterial 32 - 48 mmHg 50  - - - -  Bicarbonate 20.0 - 28.0 mmol/L 26.9  - - - 31.2  31.4   TCO2 22 - 32 mmol/L - - - - 33  33   O2 Saturation % 97.1  - - - 64  63     Details       Multiple values from one day are sorted in reverse-chronological order         Capillary Blood Glucose: Lab Results   Component Value Date   GLUCAP 114 (H) 05/31/2024   GLUCAP 237 (H) 05/19/2022   GLUCAP 164 (H) 05/19/2022   GLUCAP 158 (H) 05/18/2022   GLUCAP 114 (H) 11/18/2011     Pulmonary Assessment Scores:  Pulmonary Assessment Scores     Row Name 06/07/24 0849         ADL UCSD   ADL Phase Entry     SOB Score total 76     Rest 0     Walk 4     Stairs 5     Bath 3     Dress 4     Shop 5       CAT Score   CAT Score 20       mMRC Score   mMRC Score 4       UCSD: Self-administered rating of dyspnea associated with activities of daily living (ADLs) 6-point scale (0 = not at all to 5 = maximal or unable to do because of breathlessness)  Scoring Scores range from 0 to 120.  Minimally important difference is 5 units  CAT: CAT can identify the health impairment of COPD patients and is better correlated with disease progression.  CAT has  a scoring range of zero to 40. The CAT score is classified into four groups of low (less than 10), medium (10 - 20), high (21-30) and very high (31-40) based on the impact level of disease on health status. A CAT score over 10 suggests significant symptoms.  A worsening CAT score could be explained by an exacerbation, poor medication adherence, poor inhaler technique, or progression of COPD or comorbid conditions.  CAT MCID is 2 points  mMRC: mMRC (Modified Medical Research Council) Dyspnea Scale is used to assess the degree of baseline functional disability in patients of respiratory disease due to dyspnea. No minimal important difference is established. A decrease in score of 1 point or greater is considered a positive change.   Pulmonary Function Assessment:  Pulmonary Function Assessment - 06/06/24 1444       Pulmonary Function Tests   FVC% 1.86 %    FEV1% 1.57 %    DLCO% 8.21 %          Exercise Target Goals: Exercise Program Goal: Individual exercise prescription set using results from initial 6 min walk test and THRR while  considering  patients activity barriers and safety.   Exercise Prescription Goal: Initial exercise prescription builds to 30-45 minutes a day of aerobic activity, 2-3 days per week.  Home exercise guidelines will be given to patient during program as part of exercise prescription that the participant will acknowledge.  Activity Barriers & Risk Stratification:  Activity Barriers & Cardiac Risk Stratification - 06/07/24 1219       Activity Barriers & Cardiac Risk Stratification   Activity Barriers Shortness of Breath;Assistive Device;Muscular Weakness;Deconditioning;Joint Problems;Right Knee Replacement;Balance Concerns          6 Minute Walk:  6 Minute Walk     Row Name 06/07/24 1216         6 Minute Walk   Phase Initial     Distance 305 feet     Walk Time 3 minutes     # of Rest Breaks 0  stopped due to desaturation     MPH 1.15     METS 0.91     RPE 13     Perceived Dyspnea  2     VO2 Peak 3.19     Symptoms Yes (comment)     Comments L hip pain (5/10), SOB     Resting HR 71 bpm     Resting BP 124/62     Resting Oxygen  Saturation  94 %     Exercise Oxygen  Saturation  during 6 min walk 79 %     Max Ex. HR 101 bpm     Max Ex. BP 176/74     2 Minute Post BP 152/76       Interval HR   1 Minute HR 86     2 Minute HR 101     3 Minute HR 97     2 Minute Post HR 87     Interval Heart Rate? Yes       Interval Oxygen    Interval Oxygen ? Yes     Baseline Oxygen  Saturation % 94 %  4L NCC     1 Minute Oxygen  Saturation % 83 %     1 Minute Liters of Oxygen  5 L  NCC     2 Minute Oxygen  Saturation % 84 %     2 Minute Liters of Oxygen  5 L     3 Minute Oxygen  Saturation % 79 %  3 Minute Liters of Oxygen  5 L     2 Minute Post Oxygen  Saturation % 96 %     2 Minute Post Liters of Oxygen  5 L        Oxygen  Initial Assessment:  Oxygen  Initial Assessment - 06/07/24 0848       Home Oxygen    Home Oxygen  Device Portable Concentrator;Home Concentrator;E-Tanks    Sleep  Oxygen  Prescription Continuous    Liters per minute 4    Home Exercise Oxygen  Prescription Continuous    Liters per minute 5    Home Resting Oxygen  Prescription Continuous    Liters per minute 4    Compliance with Home Oxygen  Use Yes      Initial 6 min Walk   Oxygen  Used Continuous;E-Tanks    Liters per minute 5      Program Oxygen  Prescription   Program Oxygen  Prescription Continuous;E-Tanks    Liters per minute 5      Intervention   Short Term Goals To learn and exhibit compliance with exercise, home and travel O2 prescription;To learn and understand importance of monitoring SPO2 with pulse oximeter and demonstrate accurate use of the pulse oximeter.;To learn and understand importance of maintaining oxygen  saturations>88%;To learn and demonstrate proper use of respiratory medications;To learn and demonstrate proper pursed lip breathing techniques or other breathing techniques.     Long  Term Goals Exhibits compliance with exercise, home  and travel O2 prescription;Verbalizes importance of monitoring SPO2 with pulse oximeter and return demonstration;Maintenance of O2 saturations>88%;Exhibits proper breathing techniques, such as pursed lip breathing or other method taught during program session;Compliance with respiratory medication;Demonstrates proper use of MDIs          Oxygen  Re-Evaluation:   Oxygen  Discharge (Final Oxygen  Re-Evaluation):   Initial Exercise Prescription:  Initial Exercise Prescription - 06/07/24 1200       Date of Initial Exercise RX and Referring Provider   Date 06/07/24    Referring Provider Geronimo Amel MD      Oxygen    Oxygen  Continuous    Liters 5    Maintain Oxygen  Saturation 88% or higher      NuStep   Level 1    SPM 80    Minutes 15    METs 1.5      Arm Ergometer   Level 1    RPM 25    Minutes 15    METs 1.5      Prescription Details   Frequency (times per week) 3    Duration Progress to 30 minutes of continuous aerobic  without signs/symptoms of physical distress      Intensity   THRR 40-80% of Max Heartrate 100-129    Ratings of Perceived Exertion 11-13    Perceived Dyspnea 0-4      Progression   Progression Continue to progress workloads to maintain intensity without signs/symptoms of physical distress.      Resistance Training   Training Prescription Yes    Weight 4 lb    Reps 10-15          Perform Capillary Blood Glucose checks as needed.  Exercise Prescription Changes:   Exercise Prescription Changes     Row Name 06/07/24 1200             Response to Exercise   Blood Pressure (Admit) 124/62       Blood Pressure (Exercise) 176/74       Blood Pressure (Exit) 132/72       Heart Rate (Admit) 71  bpm       Heart Rate (Exercise) 101 bpm       Heart Rate (Exit) 83 bpm       Oxygen  Saturation (Admit) 94 %       Oxygen  Saturation (Exercise) 79 %       Oxygen  Saturation (Exit) 93 %       Rating of Perceived Exertion (Exercise) 13       Perceived Dyspnea (Exercise) 2       Symptoms L hip pain (5/10/), SOB       Comments walk test results          Exercise Comments:   Exercise Comments     Row Name 06/06/24 1433           Exercise Comments No home exercise at the moment. Causes him pain in his right hip when he walks.          Exercise Goals and Review:   Exercise Goals     Row Name 06/06/24 1432             Exercise Goals   Increase Physical Activity Yes       Intervention Provide advice, education, support and counseling about physical activity/exercise needs.;Develop an individualized exercise prescription for aerobic and resistive training based on initial evaluation findings, risk stratification, comorbidities and participant's personal goals.       Expected Outcomes Short Term: Attend rehab on a regular basis to increase amount of physical activity.;Long Term: Add in home exercise to make exercise part of routine and to increase amount of physical  activity.;Long Term: Exercising regularly at least 3-5 days a week.       Increase Strength and Stamina Yes       Intervention Provide advice, education, support and counseling about physical activity/exercise needs.;Develop an individualized exercise prescription for aerobic and resistive training based on initial evaluation findings, risk stratification, comorbidities and participant's personal goals.       Expected Outcomes Short Term: Increase workloads from initial exercise prescription for resistance, speed, and METs.;Short Term: Perform resistance training exercises routinely during rehab and add in resistance training at home;Long Term: Improve cardiorespiratory fitness, muscular endurance and strength as measured by increased METs and functional capacity ( )       Able to understand and use rate of perceived exertion (RPE) scale Yes       Intervention Provide education and explanation on how to use RPE scale       Expected Outcomes Short Term: Able to use RPE daily in rehab to express subjective intensity level;Long Term:  Able to use RPE to guide intensity level when exercising independently       Able to understand and use Dyspnea scale Yes       Intervention Provide education and explanation on how to use Dyspnea scale       Expected Outcomes Short Term: Able to use Dyspnea scale daily in rehab to express subjective sense of shortness of breath during exertion;Long Term: Able to use Dyspnea scale to guide intensity level when exercising independently       Knowledge and understanding of Target Heart Rate Range (THRR) Yes       Intervention Provide education and explanation of THRR including how the numbers were predicted and where they are located for reference       Expected Outcomes Short Term: Able to state/look up THRR;Long Term: Able to use THRR to govern intensity when exercising independently;Short Term: Able to use  daily as guideline for intensity in rehab       Able to check  pulse independently Yes       Intervention Provide education and demonstration on how to check pulse in carotid and radial arteries.;Review the importance of being able to check your own pulse for safety during independent exercise       Expected Outcomes Long Term: Able to check pulse independently and accurately;Short Term: Able to explain why pulse checking is important during independent exercise       Understanding of Exercise Prescription Yes       Intervention Provide education, explanation, and written materials on patient's individual exercise prescription       Expected Outcomes Short Term: Able to explain program exercise prescription;Long Term: Able to explain home exercise prescription to exercise independently          Exercise Goals Re-Evaluation :   Discharge Exercise Prescription (Final Exercise Prescription Changes):  Exercise Prescription Changes - 06/07/24 1200       Response to Exercise   Blood Pressure (Admit) 124/62    Blood Pressure (Exercise) 176/74    Blood Pressure (Exit) 132/72    Heart Rate (Admit) 71 bpm    Heart Rate (Exercise) 101 bpm    Heart Rate (Exit) 83 bpm    Oxygen  Saturation (Admit) 94 %    Oxygen  Saturation (Exercise) 79 %    Oxygen  Saturation (Exit) 93 %    Rating of Perceived Exertion (Exercise) 13    Perceived Dyspnea (Exercise) 2    Symptoms L hip pain (5/10/), SOB    Comments walk test results          Nutrition:  Target Goals: Understanding of nutrition guidelines, daily intake of sodium 1500mg , cholesterol 200mg , calories 30% from fat and 7% or less from saturated fats, daily to have 5 or more servings of fruits and vegetables.  Biometrics:  Pre Biometrics - 06/07/24 1234       Pre Biometrics   Height 5' 9.5 (1.765 m)    Weight 224 lb 1.6 oz (101.7 kg)    Waist Circumference 43 inches    Hip Circumference 42 inches    Waist to Hip Ratio 1.02 %    BMI (Calculated) 32.63    Grip Strength 15.9 kg    Single Leg Stand 2.2  seconds           Nutrition Therapy Plan and Nutrition Goals:  Nutrition Therapy & Goals - 06/06/24 1436       Intervention Plan   Intervention Nutrition handout(s) given to patient.;Prescribe, educate and counsel regarding individualized specific dietary modifications aiming towards targeted core components such as weight, hypertension, lipid management, diabetes, heart failure and other comorbidities.    Expected Outcomes Short Term Goal: Understand basic principles of dietary content, such as calories, fat, sodium, cholesterol and nutrients.;Short Term Goal: A plan has been developed with personal nutrition goals set during dietitian appointment.;Long Term Goal: Adherence to prescribed nutrition plan.          Nutrition Assessments:  MEDIFICTS Score Key: >=70 Need to make dietary changes  40-70 Heart Healthy Diet <= 40 Therapeutic Level Cholesterol Diet  Flowsheet Row PULMONARY REHAB COPD ORIENTATION from 06/07/2024 in [facility] CARDIAC REH[facility]ATION  Picture Your Plate Total Score on Admission 64 (P)    Picture Your Plate Scores: <59 Unhealthy dietary pattern with much room for improvement. 41-50 Dietary pattern unlikely to meet recommendations for good health and room for improvement. 51-60 More healthful  dietary pattern, with some room for improvement.  >60 Healthy dietary pattern, although there may be some specific behaviors that could be improved.    Nutrition Goals Re-Evaluation:   Nutrition Goals Discharge (Final Nutrition Goals Re-Evaluation):   Psychosocial: Target Goals: Acknowledge presence or absence of significant depression and/or stress, maximize coping skills, provide positive support system. Participant is able to verbalize types and ability to use techniques and skills needed for reducing stress and depression.  Initial Review & Psychosocial Screening:  Initial Psych Review & Screening - 06/06/24 1436       Initial Review   Current issues with  None Identified      Family Dynamics   Good Support System? Yes      Barriers   Psychosocial barriers to participate in program There are no identifiable barriers or psychosocial needs.      Screening Interventions   Interventions Encouraged to exercise    Expected Outcomes Short Term goal: Identification and review with participant of any Quality of Life or Depression concerns found by scoring the questionnaire.;Long Term goal: The participant improves quality of Life and PHQ9 Scores as seen by post scores and/or verbalization of changes;Long Term Goal: Stressors or current issues are controlled or eliminated.;Short Term goal: Utilizing psychosocial counselor, staff and physician to assist with identification of specific Stressors or current issues interfering with healing process. Setting desired goal for each stressor or current issue identified.          Quality of Life Scores:  Scores of 19 and below usually indicate a poorer quality of life in these areas.  A difference of  2-3 points is a clinically meaningful difference.  A difference of 2-3 points in the total score of the Quality of Life Index has been associated with significant improvement in overall quality of life, self-image, physical symptoms, and general health in studies assessing change in quality of life.   PHQ-9: Review Flowsheet  More data exists      06/07/2024 05/16/2024 01/18/2024 12/11/2023 08/20/2023  Depression screen PHQ 2/9  Decreased Interest 0 3 3 3  0  Down, Depressed, Hopeless 0 3 3 3  0  PHQ - 2 Score 0 6 6 6  0  Altered sleeping 0 0 0 - 0  Tired, decreased energy 2 0 0 0 0  Change in appetite 0 0 0 0 0  Feeling bad or failure about yourself  1 0 0 0 0  Trouble concentrating 0 0 0 0 0  Moving slowly or fidgety/restless 1 0 0 0 0  Suicidal thoughts 0 0 0 0 0  PHQ-9 Score 4 6 6   - 0   Difficult doing work/chores Somewhat difficult Not difficult at all Not difficult at all Not difficult at all Not  difficult at all    Details       Data saved with a previous flowsheet row definition        Interpretation of Total Score  Total Score Depression Severity:  1-4 = Minimal depression, 5-9 = Mild depression, 10-14 = Moderate depression, 15-19 = Moderately severe depression, 20-27 = Severe depression   Psychosocial Evaluation and Intervention:  Psychosocial Evaluation - 06/06/24 1437       Psychosocial Evaluation & Interventions   Interventions Encouraged to exercise with the program and follow exercise prescription    Comments Mr. Huynh is a pleasant 79 year old male who is coming into rehab for several pulmonary diagnosis'. He came back in 2019 for cardiac rehab and graduated with  36 sessions. He currently wears 4L of oxygen , and turns it up to 5 when ambulating. He uses a cane to get around, and he states his right hip causes him a lot of pain when walking. His doctor stated he needs surgery on that hip. He has had right knee surgery back in 2022. He doesn't do any home exercise due to his hip pain. He is a retired psychologist, occupational.  He is scheduled for his orientation at 0830 tomorrow morning.    Continue Psychosocial Services  Follow up required by staff          Psychosocial Re-Evaluation:   Psychosocial Discharge (Final Psychosocial Re-Evaluation):    Education: Education Goals: Education classes will be provided on a weekly basis, covering required topics. Participant will state understanding/return demonstration of topics presented.  Learning Barriers/Preferences:  Learning Barriers/Preferences - 06/06/24 1435       Learning Barriers/Preferences   Learning Barriers None    Learning Preferences Group Instruction;Individual Instruction;Pictoral;Skilled Demonstration;Verbal Instruction;Video;Written Material;Computer/Internet;Audio          Education Topics: How Lungs Work and Diseases: - Discuss the anatomy of the lungs and diseases that can affect the lungs, such as  COPD.   Exercise: -Discuss the importance of exercise, FITT principles of exercise, normal and abnormal responses to exercise, and how to exercise safely.   Environmental Irritants: -Discuss types of environmental irritants and how to limit exposure to environmental irritants.   Meds/Inhalers and oxygen : - Discuss respiratory medications, definition of an inhaler and oxygen , and the proper way to use an inhaler and oxygen .   Energy Saving Techniques: - Discuss methods to conserve energy and decrease shortness of breath when performing activities of daily living.    Bronchial Hygiene / Breathing Techniques: - Discuss breathing mechanics, pursed-lip breathing technique,  proper posture, effective ways to clear airways, and other functional breathing techniques   Cleaning Equipment: - Provides group verbal and written instruction about the health risks of elevated stress, cause of high stress, and healthy ways to reduce stress.   Nutrition I: Fats: - Discuss the types of cholesterol, what cholesterol does to the body, and how cholesterol levels can be controlled. Flowsheet Row CARDIAC REHAB PHASE II EXERCISE from 09/02/2017 in [place] [facility]  Date 08/19/17  Educator DC  Instruction Review Code 2- Demonstrated Understanding    Nutrition II: Labels: -Discuss the different components of food labels and how to read food labels. Flowsheet Row CARDIAC REHAB PHASE II EXERCISE from 09/02/2017 in [place] [facility]  Date 08/26/17  Educator DJ  Instruction Review Code 2- Demonstrated Understanding    Respiratory Infections: - Discuss the signs and symptoms of respiratory infections, ways to prevent respiratory infections, and the importance of seeking medical treatment when having a respiratory infection.   Stress I: Signs and Symptoms: - Discuss the causes of stress, how stress may lead to anxiety and depression, and ways to limit  stress.   Stress II: Relaxation: -Discuss relaxation techniques to limit stress. Flowsheet Row CARDIAC REHAB PHASE II EXERCISE from 09/02/2017 in [place] [facility]  Date 06/17/17  Educator DC  Instruction Review Code 2- Demonstrated Understanding    Oxygen  for Home/Travel: - Discuss how to prepare for travel when on oxygen  and proper ways to transport and store oxygen  to ensure safety.   Knowledge Questionnaire Score:  Knowledge Questionnaire Score - 06/07/24 1235       Knowledge Questionnaire Score   Pre Score 15/18  Core Components/Risk Factors/Patient Goals at Admission:  Personal Goals and Risk Factors at Admission - 06/07/24 1236       Core Components/Risk Factors/Patient Goals on Admission    Weight Management Yes;Obesity;Weight Loss    Intervention Weight Management: Develop a combined nutrition and exercise program designed to reach desired caloric intake, while maintaining appropriate intake of nutrient and fiber, sodium and fats, and appropriate energy expenditure required for the weight goal.;Weight Management: Provide education and appropriate resources to help participant work on and attain dietary goals.;Weight Management/Obesity: Establish reasonable short term and long term weight goals.;Obesity: Provide education and appropriate resources to help participant work on and attain dietary goals.    Admit Weight 224 lb 1.6 oz (101.7 kg)    Goal Weight: Short Term 220 lb (99.8 kg)    Goal Weight: Long Term 215 lb (97.5 kg)    Expected Outcomes Short Term: Continue to assess and modify interventions until short term weight is achieved;Long Term: Adherence to nutrition and physical activity/exercise program aimed toward attainment of established weight goal;Weight Loss: Understanding of general recommendations for a balanced deficit meal plan, which promotes 1-2 lb weight loss per week and includes a negative energy balance of [phone]  kcal/d;Understanding recommendations for meals to include 15-35% energy as protein, 25-35% energy from fat, 35-60% energy from carbohydrates, less than 200mg  of dietary cholesterol, 20-35 gm of total fiber daily;Understanding of distribution of calorie intake throughout the day with the consumption of 4-5 meals/snacks    Improve shortness of breath with ADL's Yes    Intervention Provide education, individualized exercise plan and daily activity instruction to help decrease symptoms of SOB with activities of daily living.    Expected Outcomes Short Term: Improve cardiorespiratory fitness to achieve a reduction of symptoms when performing ADLs;Long Term: Be able to perform more ADLs without symptoms or delay the onset of symptoms    Increase knowledge of respiratory medications and ability to use respiratory devices properly  Yes    Intervention Provide education and demonstration as needed of appropriate use of medications, inhalers, and oxygen  therapy.    Expected Outcomes Short Term: Achieves understanding of medications use. Understands that oxygen  is a medication prescribed by physician. Demonstrates appropriate use of inhaler and oxygen  therapy.;Long Term: Maintain appropriate use of medications, inhalers, and oxygen  therapy.    Hypertension Yes    Intervention Provide education on lifestyle modifcations including regular physical activity/exercise, weight management, moderate sodium restriction and increased consumption of fresh fruit, vegetables, and low fat dairy, alcohol moderation, and smoking cessation.;Monitor prescription use compliance.    Expected Outcomes Long Term: Maintenance of blood pressure at goal levels.;Short Term: Continued assessment and intervention until BP is < 140/101mm HG in hypertensive participants. < 130/53mm HG in hypertensive participants with diabetes, heart failure or chronic kidney disease.    Lipids Yes    Intervention Provide education and support for participant on  nutrition & aerobic/resistive exercise along with prescribed medications to achieve LDL 70mg , HDL >40mg .    Expected Outcomes Long Term: Cholesterol controlled with medications as prescribed, with individualized exercise RX and with personalized nutrition plan. Value goals: LDL < 70mg , HDL > 40 mg.;Short Term: Participant states understanding of desired cholesterol values and is compliant with medications prescribed. Participant is following exercise prescription and nutrition guidelines.          Core Components/Risk Factors/Patient Goals Review:    Core Components/Risk Factors/Patient Goals at Discharge (Final Review):    ITP Comments:  ITP Comments  Row Name 06/06/24 1438 06/07/24 1214 06/21/24 0957       ITP Comments Completed virtual orientation today.  EP evaluation is scheduled for 05/07/25 at 0830 .  Documentation for diagnosis can be found in [facility] encounter 06/01/24. Patient attend orientation today.  Patient is attending Pulmonary Rehabilitation Program.  Documentation for diagnosis can be found in [facility] OV 06/01/24.  Reviewed medical chart, RPE/RPD, gym safety, and program guidelines.  Patient was fitted to equipment they will be using during rehab.  Patient is scheduled to start exercise on Wednesday 06/08/24 at 915.   Initial ITP created and sent for review and signature by Dr. Anton Kelp, Medical Director for Pulmonary Rehabilitation Program.  Service Time [phone] 30 day review completed. ITP sent to Dr.Jehanzeb Memon, Medical Director of  Pulmonary Rehab. Continue with ITP unless changes are made by physician.  Newer to program        Comments: 30 day review      [1]  Current Outpatient Medications:    albuterol  (VENTOLIN  HFA) 108 (90 Base) MCG/ACT inhaler, Inhale 2 puffs into the lungs every 6 (six) hours as needed for wheezing or shortness of breath., Disp: 18 g, Rfl: 3   amLODipine  (NORVASC ) 10 MG tablet, TAKE ONE TABLET BY MOUTH ONCE DAILY., Disp: 90 tablet, Rfl: 2    atropine  1 % ophthalmic solution, Place 1 drop into both eyes 2 (two) times daily., Disp: , Rfl:    calcitRIOL  (ROCALTROL ) 0.25 MCG capsule, Take 0.25 mcg by mouth every Monday, Wednesday, and Friday., Disp: , Rfl:    Coenzyme Q10-Vitamin E 100-150 MG-UNIT CAPS, Take 1 capsule by mouth at bedtime., Disp: , Rfl:    dorzolamide (TRUSOPT) 2 % ophthalmic solution, Place 1 drop into both eyes 2 (two) times daily., Disp: , Rfl:    ELIQUIS  5 MG TABS tablet, Take 1 tablet (5 mg total) by mouth 2 (two) times daily., Disp: 60 tablet, Rfl: 6   ezetimibe  (ZETIA ) 10 MG tablet, Take 1 tablet (10 mg total) by mouth daily., Disp: 90 tablet, Rfl: 3   FARXIGA 5 MG TABS tablet, Take 5 mg by mouth daily., Disp: , Rfl:    furosemide  (LASIX ) 20 MG tablet, TAKE 1 TABLET BY MOUTH ONCE DAILY., Disp: 90 tablet, Rfl: 2   hydrALAZINE  (APRESOLINE ) 25 MG tablet, Take 1 tablet (25 mg total) by mouth 2 (two) times daily., Disp: 180 tablet, Rfl: 1   latanoprost  (XALATAN ) 0.005 % ophthalmic solution, Place 1 drop into both eyes at bedtime., Disp: , Rfl:    metoprolol  succinate (TOPROL -XL) 50 MG 24 hr tablet, Take 1 tablet (50 mg total) by mouth daily. Take with or immediately following a meal., Disp: 90 tablet, Rfl: 3   Misc Natural Products (OSTEO BI-FLEX ADV JOINT SHIELD) TABS, Take 1 tablet by mouth daily., Disp: , Rfl:    Multiple Vitamins-Minerals (CENTRUM PO), Take 1 tablet by mouth every morning., Disp: , Rfl:    olmesartan  (BENICAR ) 5 MG tablet, Take 1 tablet (5 mg total) by mouth daily., Disp: 90 tablet, Rfl: 4   pregabalin  (LYRICA ) 200 MG capsule, TAKE ONE CAPSULE BY MOUTH TWICE DAILY., Disp: 60 capsule, Rfl: 5   rosuvastatin  (CRESTOR ) 10 MG tablet, TAKE (1) TABLET BY MOUTH ONCE DAILY., Disp: 90 tablet, Rfl: 3   sodium bicarbonate  650 MG tablet, Take 650 mg by mouth 3 (three) times daily., Disp: , Rfl:    timolol  (TIMOPTIC ) 0.5 % ophthalmic solution, Place 1 drop into both eyes 2 (two) times daily., Disp: ,  Rfl:     tobramycin  (TOBREX ) 0.3 % ophthalmic solution, Place 1 drop into both eyes every 6 (six) hours. (Patient not taking: Reported on 02/02/2024), Disp: , Rfl:  [2]  Social History Tobacco Use  Smoking Status Former   Current packs/day: 0.00   Average packs/day: 0.1 packs/day for 20.0 years (2.0 ttl pk-yrs)   Types: Cigarettes   Start date: 05/08/1979   Quit date: 05/08/1999   Years since quitting: 25.1  Smokeless Tobacco Never

## 2024-06-22 ENCOUNTER — Encounter (HOSPITAL_COMMUNITY)
Admission: RE | Admit: 2024-06-22 | Discharge: 2024-06-22 | Disposition: A | Source: Ambulatory Visit | Attending: Internal Medicine

## 2024-06-22 DIAGNOSIS — J849 Interstitial pulmonary disease, unspecified: Secondary | ICD-10-CM

## 2024-06-22 DIAGNOSIS — J449 Chronic obstructive pulmonary disease, unspecified: Secondary | ICD-10-CM

## 2024-06-22 DIAGNOSIS — J432 Centrilobular emphysema: Secondary | ICD-10-CM

## 2024-06-22 NOTE — Progress Notes (Signed)
 Daily Session Note  Patient Details  Name: Kyle Patel. MRN: [id number] Date of Birth: [birth year] Referring Provider:   Flowsheet Row PULMONARY REHAB COPD ORIENTATION from 06/07/2024 in [facility] CARDIAC REHABILITATION  Referring Provider Geronimo Amel MD    Encounter Date: 06/22/2024  Check In:  Session Check In - 06/22/24 0920       Check-In   Supervising physician immediately available to respond to emergencies See telemetry face sheet for immediately available MD    Location [facility]    Staff Present Powell Benders, BS, Exercise Physiologist;Brittany Jackquline, BSN, RN, WTA-C;Hillary Troutman BSN, RN;Jessica Ellsworth, MA, RCEP, CCRP, CCET    Virtual Visit No    Medication changes reported     No    Fall or balance concerns reported    No    Tobacco Cessation No Change    Warm-up and Cool-down Performed on first and last piece of equipment    Resistance Training Performed Yes    VAD Patient? No    PAD/SET Patient? No      Pain Assessment   Currently in Pain? No/denies    Pain Score 0-No pain    Multiple Pain Sites No          Capillary Blood Glucose: No results found for this or any previous visit (from the past 24 hours).    Tobacco Use History[1]  Goals Met:  Independence with exercise equipment Exercise tolerated well No report of concerns or symptoms today Strength training completed today  Goals Unmet:  Not Applicable  Comments: Pt able to follow exercise prescription today without complaint.  Will continue to monitor for progression.        [1]  Social History Tobacco Use  Smoking Status Former   Current packs/day: 0.00   Average packs/day: 0.1 packs/day for 20.0 years (2.0 ttl pk-yrs)   Types: Cigarettes   Start date: 05/08/1979   Quit date: 05/08/1999   Years since quitting: 25.1  Smokeless Tobacco Never

## 2024-06-24 ENCOUNTER — Encounter (HOSPITAL_COMMUNITY)
Admission: RE | Admit: 2024-06-24 | Discharge: 2024-06-24 | Disposition: A | Source: Ambulatory Visit | Attending: Internal Medicine

## 2024-06-24 DIAGNOSIS — J849 Interstitial pulmonary disease, unspecified: Secondary | ICD-10-CM

## 2024-06-24 DIAGNOSIS — J449 Chronic obstructive pulmonary disease, unspecified: Secondary | ICD-10-CM

## 2024-06-24 DIAGNOSIS — J432 Centrilobular emphysema: Secondary | ICD-10-CM

## 2024-06-24 NOTE — Progress Notes (Signed)
 Daily Session Note  Patient Details  Name: Kyle Patel. MRN: [id number] Date of Birth: [birth year] Referring Provider:   Flowsheet Row PULMONARY REHAB COPD ORIENTATION from 06/07/2024 in [facility] CARDIAC REHABILITATION  Referring Provider Geronimo Amel MD    Encounter Date: 06/24/2024  Check In:  Session Check In - 06/24/24 0907       Check-In   Supervising physician immediately available to respond to emergencies See telemetry face sheet for immediately available MD    Location [facility]    Staff Present Richerd Buddle, RN;Heather Con, BS, Exercise Physiologist;Aidyn Kellis Jackquline, BSN, RN, WTA-C    Virtual Visit No    Medication changes reported     No    Fall or balance concerns reported    No    Tobacco Cessation No Change    Warm-up and Cool-down Performed on first and last piece of equipment    Resistance Training Performed Yes    VAD Patient? No    PAD/SET Patient? No      Pain Assessment   Currently in Pain? No/denies          Capillary Blood Glucose: No results found for this or any previous visit (from the past 24 hours).    Tobacco Use History[1]  Goals Met:  Proper associated with RPD/PD & O2 Sat Independence with exercise equipment Improved SOB with ADL's Using PLB without cueing & demonstrates good technique Exercise tolerated well No report of concerns or symptoms today Strength training completed today  Goals Unmet:  Not Applicable  Comments: Pt able to follow exercise prescription today without complaint.  Will continue to monitor for progression.        [1]  Social History Tobacco Use  Smoking Status Former   Current packs/day: 0.00   Average packs/day: 0.1 packs/day for 20.0 years (2.0 ttl pk-yrs)   Types: Cigarettes   Start date: 05/08/1979   Quit date: 05/08/1999   Years since quitting: 25.1  Smokeless Tobacco Never

## 2024-06-27 ENCOUNTER — Encounter (HOSPITAL_COMMUNITY)

## 2024-06-29 ENCOUNTER — Encounter (HOSPITAL_COMMUNITY)
Admission: RE | Admit: 2024-06-29 | Discharge: 2024-06-29 | Disposition: A | Source: Ambulatory Visit | Attending: Internal Medicine

## 2024-06-29 DIAGNOSIS — J449 Chronic obstructive pulmonary disease, unspecified: Secondary | ICD-10-CM

## 2024-06-29 DIAGNOSIS — J849 Interstitial pulmonary disease, unspecified: Secondary | ICD-10-CM

## 2024-06-29 DIAGNOSIS — J432 Centrilobular emphysema: Secondary | ICD-10-CM

## 2024-06-29 NOTE — Progress Notes (Signed)
 Daily Session Note  Patient Details  Name: Kyle Patel. MRN: [id number] Date of Birth: [birth year] Referring Provider:   Flowsheet Row PULMONARY REHAB COPD ORIENTATION from 06/07/2024 in [facility] CARDIAC REHABILITATION  Referring Provider Geronimo Amel MD    Encounter Date: 06/29/2024  Check In:  Session Check In - 06/29/24 0931       Check-In   Supervising physician immediately available to respond to emergencies See telemetry face sheet for immediately available MD    Location [facility]    Staff Present Harlene Gelineau, MA, RCEP, CCRP, CCET;Tamilyn Lupien Jackquline, BSN, RN, WTA-C;Heather Con, BS, Exercise Physiologist    Virtual Visit No    Medication changes reported     No    Fall or balance concerns reported    No    Tobacco Cessation No Change    Warm-up and Cool-down Performed on first and last piece of equipment    Resistance Training Performed Yes    VAD Patient? No    PAD/SET Patient? No      Pain Assessment   Currently in Pain? No/denies          Capillary Blood Glucose: No results found for this or any previous visit (from the past 24 hours).    Tobacco Use History[1]  Goals Met:  Proper associated with RPD/PD & O2 Sat Independence with exercise equipment Improved SOB with ADL's Using PLB without cueing & demonstrates good technique Exercise tolerated well No report of concerns or symptoms today Strength training completed today  Goals Unmet:  Not Applicable  Comments: Pt able to follow exercise prescription today without complaint.  Will continue to monitor for progression.        [1]  Social History Tobacco Use  Smoking Status Former   Current packs/day: 0.00   Average packs/day: 0.1 packs/day for 20.0 years (2.0 ttl pk-yrs)   Types: Cigarettes   Start date: 05/08/1979   Quit date: 05/08/1999   Years since quitting: 25.1  Smokeless Tobacco Never

## 2024-06-30 NOTE — Progress Notes (Signed)
 I called and spoke to pt. Pt informed of Beth's note and verbalized understanding. NFN

## 2024-07-01 ENCOUNTER — Encounter (HOSPITAL_COMMUNITY): Admission: RE | Admit: 2024-07-01 | Source: Ambulatory Visit

## 2024-07-01 DIAGNOSIS — J849 Interstitial pulmonary disease, unspecified: Secondary | ICD-10-CM

## 2024-07-01 DIAGNOSIS — J449 Chronic obstructive pulmonary disease, unspecified: Secondary | ICD-10-CM

## 2024-07-01 DIAGNOSIS — J432 Centrilobular emphysema: Secondary | ICD-10-CM

## 2024-07-01 NOTE — Progress Notes (Signed)
 Daily Session Note  Patient Details  Name: Kyle Patel. MRN: [id number] Date of Birth: 1945-12-27 Referring Provider:   Flowsheet Row PULMONARY REHAB COPD ORIENTATION from 06/07/2024 in [facility] CARDIAC REHABILITATION  Referring Provider Geronimo Amel MD    Encounter Date: 07/01/2024  Check In:  Session Check In - 07/01/24 0930       Check-In   Supervising physician immediately available to respond to emergencies See telemetry face sheet for immediately available MD    Location [facility]    Staff Present Laymon Rattler, BSN, RN, WTA-C;Heather Con, BS, Exercise Physiologist;Victoria Zina, RN    Virtual Visit No    Medication changes reported     No    Fall or balance concerns reported    No    Tobacco Cessation No Change    Warm-up and Cool-down Performed on first and last piece of equipment    Resistance Training Performed Yes    VAD Patient? No    PAD/SET Patient? No      Pain Assessment   Currently in Pain? No/denies          Capillary Blood Glucose: No results found for this or any previous visit (from the past 24 hours).    Tobacco Use History[1]  Goals Met:  Proper associated with RPD/PD & O2 Sat Independence with exercise equipment Improved SOB with ADL's Using PLB without cueing & demonstrates good technique Exercise tolerated well No report of concerns or symptoms today Strength training completed today  Goals Unmet:  Not Applicable  Comments: Pt able to follow exercise prescription today without complaint.  Will continue to monitor for progression.        [1]  Social History Tobacco Use  Smoking Status Former   Current packs/day: 0.00   Average packs/day: 0.1 packs/day for 20.0 years (2.0 ttl pk-yrs)   Types: Cigarettes   Start date: 05/08/1979   Quit date: 05/08/1999   Years since quitting: 25.1  Smokeless Tobacco Never

## 2024-07-04 ENCOUNTER — Encounter (HOSPITAL_COMMUNITY)

## 2024-07-06 ENCOUNTER — Encounter (HOSPITAL_COMMUNITY)

## 2024-07-08 ENCOUNTER — Encounter (HOSPITAL_COMMUNITY)

## 2024-07-11 ENCOUNTER — Encounter (HOSPITAL_COMMUNITY)

## 2024-07-13 ENCOUNTER — Encounter (HOSPITAL_COMMUNITY)

## 2024-07-15 ENCOUNTER — Encounter (HOSPITAL_COMMUNITY)

## 2024-07-18 ENCOUNTER — Encounter (HOSPITAL_COMMUNITY)

## 2024-07-20 ENCOUNTER — Encounter (HOSPITAL_COMMUNITY)

## 2024-07-22 ENCOUNTER — Encounter (HOSPITAL_COMMUNITY)

## 2024-07-25 ENCOUNTER — Encounter (HOSPITAL_COMMUNITY)

## 2024-07-27 ENCOUNTER — Encounter (HOSPITAL_COMMUNITY)

## 2024-07-29 ENCOUNTER — Encounter (HOSPITAL_COMMUNITY)

## 2024-08-01 ENCOUNTER — Encounter (HOSPITAL_COMMUNITY)

## 2024-08-02 ENCOUNTER — Ambulatory Visit: Admitting: Internal Medicine

## 2024-08-03 ENCOUNTER — Encounter (HOSPITAL_COMMUNITY)

## 2024-08-05 ENCOUNTER — Encounter (HOSPITAL_COMMUNITY)

## 2024-08-08 ENCOUNTER — Encounter (HOSPITAL_COMMUNITY)

## 2024-08-10 ENCOUNTER — Encounter (HOSPITAL_COMMUNITY)

## 2024-08-12 ENCOUNTER — Encounter (HOSPITAL_COMMUNITY)

## 2024-08-15 ENCOUNTER — Encounter (HOSPITAL_COMMUNITY)

## 2024-08-17 ENCOUNTER — Encounter (HOSPITAL_COMMUNITY)

## 2024-08-19 ENCOUNTER — Encounter (HOSPITAL_COMMUNITY)

## 2024-08-22 ENCOUNTER — Encounter (HOSPITAL_COMMUNITY)

## 2024-08-24 ENCOUNTER — Encounter (HOSPITAL_COMMUNITY)

## 2024-08-26 ENCOUNTER — Encounter (HOSPITAL_COMMUNITY)

## 2024-08-29 ENCOUNTER — Encounter (HOSPITAL_COMMUNITY)

## 2024-08-31 ENCOUNTER — Encounter (HOSPITAL_COMMUNITY)

## 2024-09-02 ENCOUNTER — Encounter (HOSPITAL_COMMUNITY)

## 2024-09-05 ENCOUNTER — Encounter (HOSPITAL_COMMUNITY)

## 2024-09-07 ENCOUNTER — Encounter (HOSPITAL_COMMUNITY)

## 2024-09-09 ENCOUNTER — Encounter (HOSPITAL_COMMUNITY)

## 2024-09-12 ENCOUNTER — Encounter (HOSPITAL_COMMUNITY)

## 2024-09-14 ENCOUNTER — Encounter (HOSPITAL_COMMUNITY)

## 2024-09-16 ENCOUNTER — Encounter (HOSPITAL_COMMUNITY)

## 2024-09-19 ENCOUNTER — Ambulatory Visit: Payer: Self-pay | Admitting: Internal Medicine

## 2024-09-19 ENCOUNTER — Encounter (HOSPITAL_COMMUNITY)

## 2024-09-21 ENCOUNTER — Encounter (HOSPITAL_COMMUNITY)

## 2024-09-23 ENCOUNTER — Encounter (HOSPITAL_COMMUNITY)

## 2024-09-26 ENCOUNTER — Encounter (HOSPITAL_COMMUNITY)

## 2024-09-28 ENCOUNTER — Encounter (HOSPITAL_COMMUNITY)

## 2024-09-30 ENCOUNTER — Encounter (HOSPITAL_COMMUNITY)

## 2024-10-03 ENCOUNTER — Encounter (HOSPITAL_COMMUNITY)

## 2024-10-05 ENCOUNTER — Encounter (HOSPITAL_COMMUNITY)
